# Patient Record
Sex: Female | Born: 1940 | Race: White | Hispanic: No | Marital: Married | Smoking: Former smoker
Health system: Southern US, Community
[De-identification: ages and names within clinical notes are randomized; demographics above are authoritative.]

## PROBLEM LIST (undated history)

## (undated) DIAGNOSIS — I639 Cerebral infarction, unspecified: Secondary | ICD-10-CM

## (undated) DIAGNOSIS — K5790 Diverticulosis of intestine, part unspecified, without perforation or abscess without bleeding: Secondary | ICD-10-CM

## (undated) DIAGNOSIS — I10 Essential (primary) hypertension: Secondary | ICD-10-CM

## (undated) DIAGNOSIS — R413 Other amnesia: Secondary | ICD-10-CM

## (undated) DIAGNOSIS — G40219 Localization-related (focal) (partial) symptomatic epilepsy and epileptic syndromes with complex partial seizures, intractable, without status epilepticus: Secondary | ICD-10-CM

## (undated) DIAGNOSIS — J449 Chronic obstructive pulmonary disease, unspecified: Secondary | ICD-10-CM

## (undated) DIAGNOSIS — M1990 Unspecified osteoarthritis, unspecified site: Secondary | ICD-10-CM

## (undated) DIAGNOSIS — H8309 Labyrinthitis, unspecified ear: Secondary | ICD-10-CM

## (undated) DIAGNOSIS — R569 Unspecified convulsions: Secondary | ICD-10-CM

## (undated) DIAGNOSIS — H9190 Unspecified hearing loss, unspecified ear: Secondary | ICD-10-CM

## (undated) DIAGNOSIS — E119 Type 2 diabetes mellitus without complications: Secondary | ICD-10-CM

## (undated) DIAGNOSIS — I619 Nontraumatic intracerebral hemorrhage, unspecified: Secondary | ICD-10-CM

## (undated) DIAGNOSIS — I4891 Unspecified atrial fibrillation: Secondary | ICD-10-CM

## (undated) DIAGNOSIS — Z1501 Genetic susceptibility to malignant neoplasm of breast: Secondary | ICD-10-CM

## (undated) DIAGNOSIS — F338 Other recurrent depressive disorders: Secondary | ICD-10-CM

## (undated) DIAGNOSIS — Z1509 Genetic susceptibility to other malignant neoplasm: Secondary | ICD-10-CM

## (undated) DIAGNOSIS — I499 Cardiac arrhythmia, unspecified: Secondary | ICD-10-CM

## (undated) DIAGNOSIS — R7303 Prediabetes: Secondary | ICD-10-CM

## (undated) HISTORY — DX: Labyrinthitis, unspecified ear: H83.09

## (undated) HISTORY — DX: Type 2 diabetes mellitus without complications: E11.9

## (undated) HISTORY — DX: Genetic susceptibility to malignant neoplasm of breast: Z15.01

## (undated) HISTORY — DX: Unspecified convulsions: R56.9

## (undated) HISTORY — DX: Other amnesia: R41.3

## (undated) HISTORY — DX: Essential (primary) hypertension: I10

## (undated) HISTORY — PX: CEREBRAL ANEURYSM REPAIR: SHX164

## (undated) HISTORY — DX: Nontraumatic intracerebral hemorrhage, unspecified: I61.9

## (undated) HISTORY — PX: OTHER SURGICAL HISTORY: SHX169

## (undated) HISTORY — DX: Genetic susceptibility to other malignant neoplasm: Z15.09

## (undated) HISTORY — DX: Localization-related (focal) (partial) symptomatic epilepsy and epileptic syndromes with complex partial seizures, intractable, without status epilepticus: G40.219

## (undated) HISTORY — PX: TONSILLECTOMY: SHX5217

## (undated) HISTORY — DX: Unspecified hearing loss, unspecified ear: H91.90

---

## 1997-05-19 ENCOUNTER — Other Ambulatory Visit: Admission: RE | Admit: 1997-05-19 | Discharge: 1997-05-19 | Payer: Self-pay | Admitting: Obstetrics and Gynecology

## 1998-06-18 ENCOUNTER — Other Ambulatory Visit: Admission: RE | Admit: 1998-06-18 | Discharge: 1998-06-18 | Payer: Self-pay | Admitting: Obstetrics and Gynecology

## 1999-10-24 ENCOUNTER — Other Ambulatory Visit: Admission: RE | Admit: 1999-10-24 | Discharge: 1999-10-24 | Payer: Self-pay | Admitting: Obstetrics and Gynecology

## 2000-03-10 ENCOUNTER — Other Ambulatory Visit: Admission: RE | Admit: 2000-03-10 | Discharge: 2000-03-10 | Payer: Self-pay | Admitting: Obstetrics and Gynecology

## 2000-03-10 ENCOUNTER — Encounter (INDEPENDENT_AMBULATORY_CARE_PROVIDER_SITE_OTHER): Payer: Self-pay | Admitting: Specialist

## 2001-11-02 ENCOUNTER — Other Ambulatory Visit: Admission: RE | Admit: 2001-11-02 | Discharge: 2001-11-02 | Payer: Self-pay | Admitting: Obstetrics and Gynecology

## 2003-05-22 ENCOUNTER — Other Ambulatory Visit: Admission: RE | Admit: 2003-05-22 | Discharge: 2003-05-22 | Payer: Self-pay | Admitting: Anesthesiology

## 2003-10-11 ENCOUNTER — Ambulatory Visit (HOSPITAL_COMMUNITY): Admission: RE | Admit: 2003-10-11 | Discharge: 2003-10-11 | Payer: Self-pay | Admitting: Gastroenterology

## 2003-10-11 ENCOUNTER — Encounter (INDEPENDENT_AMBULATORY_CARE_PROVIDER_SITE_OTHER): Payer: Self-pay | Admitting: *Deleted

## 2004-09-13 ENCOUNTER — Other Ambulatory Visit: Admission: RE | Admit: 2004-09-13 | Discharge: 2004-09-13 | Payer: Self-pay | Admitting: Emergency Medicine

## 2005-11-13 ENCOUNTER — Encounter (INDEPENDENT_AMBULATORY_CARE_PROVIDER_SITE_OTHER): Payer: Self-pay | Admitting: Cardiology

## 2005-11-13 ENCOUNTER — Ambulatory Visit (HOSPITAL_COMMUNITY): Admission: RE | Admit: 2005-11-13 | Discharge: 2005-11-13 | Payer: Self-pay | Admitting: Emergency Medicine

## 2005-11-13 ENCOUNTER — Encounter: Payer: Self-pay | Admitting: Vascular Surgery

## 2005-12-23 ENCOUNTER — Encounter (HOSPITAL_COMMUNITY): Admission: RE | Admit: 2005-12-23 | Discharge: 2005-12-23 | Payer: Self-pay | Admitting: Interventional Cardiology

## 2005-12-29 ENCOUNTER — Ambulatory Visit (HOSPITAL_COMMUNITY): Admission: RE | Admit: 2005-12-29 | Discharge: 2005-12-29 | Payer: Self-pay | Admitting: Interventional Cardiology

## 2005-12-29 ENCOUNTER — Encounter (INDEPENDENT_AMBULATORY_CARE_PROVIDER_SITE_OTHER): Payer: Self-pay | Admitting: Interventional Cardiology

## 2007-04-13 ENCOUNTER — Inpatient Hospital Stay (HOSPITAL_COMMUNITY): Admission: EM | Admit: 2007-04-13 | Discharge: 2007-04-16 | Payer: Self-pay | Admitting: Emergency Medicine

## 2008-06-10 ENCOUNTER — Inpatient Hospital Stay (HOSPITAL_COMMUNITY): Admission: EM | Admit: 2008-06-10 | Discharge: 2008-06-16 | Payer: Self-pay | Admitting: Emergency Medicine

## 2008-07-05 ENCOUNTER — Ambulatory Visit (HOSPITAL_BASED_OUTPATIENT_CLINIC_OR_DEPARTMENT_OTHER): Admission: RE | Admit: 2008-07-05 | Discharge: 2008-07-05 | Payer: Self-pay | Admitting: Plastic Surgery

## 2008-07-20 ENCOUNTER — Ambulatory Visit (HOSPITAL_BASED_OUTPATIENT_CLINIC_OR_DEPARTMENT_OTHER): Admission: RE | Admit: 2008-07-20 | Discharge: 2008-07-20 | Payer: Self-pay | Admitting: Plastic Surgery

## 2010-04-21 LAB — BASIC METABOLIC PANEL
Calcium: 9 mg/dL (ref 8.4–10.5)
GFR calc Af Amer: >60 mL/min (ref >60)
GFR calc non Af Amer: >60 mL/min (ref >60)

## 2010-04-21 LAB — POCT HEMOGLOBIN-HEMACUE: Hemoglobin: 13.6 g/dL (ref 12.0–15.0)

## 2010-04-22 LAB — COMPREHENSIVE METABOLIC PANEL
ALT: 34 U/L (ref 0–35)
AST: 74 U/L — ABNORMAL HIGH (ref 0–37)
Alkaline Phosphatase: 47 U/L (ref 39–117)
CO2: 26 mEq/L (ref 19–32)
Chloride: 101 mEq/L (ref 96–112)
Creatinine, Ser: 0.46 mg/dL (ref 0.4–1.2)
GFR calc Af Amer: >60 mL/min (ref >60)
GFR calc non Af Amer: >60 mL/min (ref >60)
Potassium: 3.2 mEq/L — ABNORMAL LOW (ref 3.5–5.1)
Total Bilirubin: 0.2 mg/dL — ABNORMAL LOW (ref 0.3–1.2)

## 2010-04-22 LAB — CBC
HCT: 29.3 % — ABNORMAL LOW (ref 36.0–46.0)
HCT: 29.4 % — ABNORMAL LOW (ref 36.0–46.0)
HCT: 38.9 % (ref 36.0–46.0)
Hemoglobin: 10.2 g/dL — ABNORMAL LOW (ref 12.0–15.0)
Hemoglobin: 10.3 g/dL — ABNORMAL LOW (ref 12.0–15.0)
MCHC: 34.7 g/dL (ref 30.0–36.0)
MCHC: 35 g/dL (ref 30.0–36.0)
MCV: 100.2 fL — ABNORMAL HIGH (ref 78.0–100.0)
Platelets: 193 10*3/uL (ref 150–400)
RBC: 2.93 MIL/uL — ABNORMAL LOW (ref 3.87–5.11)
RBC: 2.94 MIL/uL — ABNORMAL LOW (ref 3.87–5.11)
RBC: 3.93 MIL/uL (ref 3.87–5.11)
RDW: 13.2 % (ref 11.5–15.5)
WBC: 7.7 10*3/uL (ref 4.0–10.5)
WBC: 8 10*3/uL (ref 4.0–10.5)
WBC: 9.8 10*3/uL (ref 4.0–10.5)

## 2010-04-22 LAB — POCT I-STAT, CHEM 8
BUN: 5 mg/dL — ABNORMAL LOW (ref 6–23)
Calcium, Ion: 1.14 mmol/L (ref 1.12–1.32)
Chloride: 105 mEq/L (ref 96–112)
HCT: 39 % (ref 36.0–46.0)
Potassium: 3.8 mEq/L (ref 3.5–5.1)
Sodium: 137 mEq/L (ref 135–145)
TCO2: 23 mmol/L (ref 0–100)

## 2010-04-22 LAB — BASIC METABOLIC PANEL
BUN: 1 mg/dL — ABNORMAL LOW (ref 6–23)
Calcium: 8.8 mg/dL (ref 8.4–10.5)
Calcium: 8.9 mg/dL (ref 8.4–10.5)
Calcium: 9 mg/dL (ref 8.4–10.5)
Chloride: 104 mEq/L (ref 96–112)
Creatinine, Ser: 0.55 mg/dL (ref 0.4–1.2)
GFR calc Af Amer: >60 mL/min (ref >60)
GFR calc Af Amer: >60 mL/min (ref >60)
GFR calc non Af Amer: >60 mL/min (ref >60)
GFR calc non Af Amer: >60 mL/min (ref >60)
GFR calc non Af Amer: >60 mL/min (ref >60)
Sodium: 135 mEq/L (ref 135–145)

## 2010-04-22 LAB — HEPATIC FUNCTION PANEL
Bilirubin, Direct: 0.1 mg/dL (ref 0.0–0.3)
Indirect Bilirubin: 0.1 mg/dL — ABNORMAL LOW (ref 0.3–0.9)
Total Bilirubin: 0.2 mg/dL — ABNORMAL LOW (ref 0.3–1.2)

## 2010-04-22 LAB — MAGNESIUM: Magnesium: 1.8 mg/dL (ref 1.5–2.5)

## 2010-04-23 LAB — COMPREHENSIVE METABOLIC PANEL WITH GFR
ALT: 22 U/L (ref 0–35)
Alkaline Phosphatase: 50 U/L (ref 39–117)
Chloride: 95 meq/L — ABNORMAL LOW (ref 96–112)
Glucose, Bld: 118 mg/dL — ABNORMAL HIGH (ref 70–99)
Potassium: 2.6 meq/L — CL (ref 3.5–5.1)
Sodium: 134 meq/L — ABNORMAL LOW (ref 135–145)
Total Protein: 6.9 g/dL (ref 6.0–8.3)

## 2010-04-23 LAB — COMPREHENSIVE METABOLIC PANEL
ALT: 19 U/L (ref 0–35)
AST: 37 U/L (ref 0–37)
AST: 38 U/L — ABNORMAL HIGH (ref 0–37)
Albumin: 3.3 g/dL — ABNORMAL LOW (ref 3.5–5.2)
Albumin: 4 g/dL (ref 3.5–5.2)
Alkaline Phosphatase: 40 U/L (ref 39–117)
BUN: 3 mg/dL — ABNORMAL LOW (ref 6–23)
BUN: 5 mg/dL — ABNORMAL LOW (ref 6–23)
CO2: 22 mEq/L (ref 19–32)
CO2: 25 mEq/L (ref 19–32)
Calcium: 8.4 mg/dL (ref 8.4–10.5)
Calcium: 9.6 mg/dL (ref 8.4–10.5)
Chloride: 101 mEq/L (ref 96–112)
Creatinine, Ser: 0.51 mg/dL (ref 0.4–1.2)
Creatinine, Ser: 0.67 mg/dL (ref 0.4–1.2)
GFR calc Af Amer: >60 mL/min (ref >60)
GFR calc Af Amer: >60 mL/min (ref >60)
GFR calc non Af Amer: >60 mL/min (ref >60)
GFR calc non Af Amer: >60 mL/min (ref >60)
Glucose, Bld: 116 mg/dL — ABNORMAL HIGH (ref 70–99)
Potassium: 4 mEq/L (ref 3.5–5.1)
Sodium: 133 mEq/L — ABNORMAL LOW (ref 135–145)
Total Bilirubin: 0.4 mg/dL (ref 0.3–1.2)
Total Bilirubin: 0.5 mg/dL (ref 0.3–1.2)
Total Protein: 5.5 g/dL — ABNORMAL LOW (ref 6.0–8.3)

## 2010-04-23 LAB — URINALYSIS, ROUTINE W REFLEX MICROSCOPIC
Bilirubin Urine: NEGATIVE
Bilirubin Urine: NEGATIVE
Glucose, UA: NEGATIVE mg/dL
Glucose, UA: NEGATIVE mg/dL
Ketones, ur: 15 mg/dL — AB
Ketones, ur: NEGATIVE mg/dL
Leukocytes, UA: NEGATIVE
Leukocytes, UA: NEGATIVE
Nitrite: NEGATIVE
Nitrite: NEGATIVE
Protein, ur: 100 mg/dL — AB
Protein, ur: NEGATIVE mg/dL
Specific Gravity, Urine: 1.01 (ref 1.005–1.030)
Specific Gravity, Urine: 1.017 (ref 1.005–1.030)
Urobilinogen, UA: 0.2 mg/dL (ref 0.0–1.0)
Urobilinogen, UA: 0.2 mg/dL (ref 0.0–1.0)
pH: 5 (ref 5.0–8.0)
pH: 7.5 (ref 5.0–8.0)

## 2010-04-23 LAB — CBC
HCT: 31.2 % — ABNORMAL LOW (ref 36.0–46.0)
HCT: 33.5 % — ABNORMAL LOW (ref 36.0–46.0)
HCT: 38.7 % (ref 36.0–46.0)
Hemoglobin: 10.8 g/dL — ABNORMAL LOW (ref 12.0–15.0)
Hemoglobin: 11.8 g/dL — ABNORMAL LOW (ref 12.0–15.0)
Hemoglobin: 13.7 g/dL (ref 12.0–15.0)
MCHC: 34.8 g/dL (ref 30.0–36.0)
MCHC: 35.1 g/dL (ref 30.0–36.0)
MCHC: 35.3 g/dL (ref 30.0–36.0)
MCV: 98.6 fL (ref 78.0–100.0)
MCV: 99.3 fL (ref 78.0–100.0)
MCV: 99.7 fL (ref 78.0–100.0)
Platelets: 204 10*3/uL (ref 150–400)
Platelets: 232 K/uL (ref 150–400)
Platelets: 274 10*3/uL (ref 150–400)
RBC: 3.14 MIL/uL — ABNORMAL LOW (ref 3.87–5.11)
RBC: 3.36 MIL/uL — ABNORMAL LOW (ref 3.87–5.11)
RBC: 3.93 MIL/uL (ref 3.87–5.11)
RDW: 13.1 % (ref 11.5–15.5)
RDW: 13.2 % (ref 11.5–15.5)
RDW: 13.3 % (ref 11.5–15.5)
WBC: 11.3 10*3/uL — ABNORMAL HIGH (ref 4.0–10.5)
WBC: 13.6 10*3/uL — ABNORMAL HIGH (ref 4.0–10.5)
WBC: 9.5 K/uL (ref 4.0–10.5)

## 2010-04-23 LAB — PHENYTOIN LEVEL, TOTAL
Phenytoin Lvl: 12 ug/mL (ref 10.0–20.0)
Phenytoin Lvl: 6.3 ug/mL — ABNORMAL LOW (ref 10.0–20.0)

## 2010-04-23 LAB — CULTURE, BLOOD (ROUTINE X 2)
Culture: NO GROWTH
Culture: NO GROWTH

## 2010-04-23 LAB — URINE MICROSCOPIC-ADD ON

## 2010-04-23 LAB — BASIC METABOLIC PANEL
BUN: <1 mg/dL — ABNORMAL LOW (ref 6–23)
CO2: 28 mEq/L (ref 19–32)
Calcium: 8.3 mg/dL — ABNORMAL LOW (ref 8.4–10.5)
Chloride: 104 mEq/L (ref 96–112)
Creatinine, Ser: 0.42 mg/dL (ref 0.4–1.2)
GFR calc Af Amer: >60 mL/min (ref >60)
GFR calc non Af Amer: >60 mL/min (ref >60)
Glucose, Bld: 135 mg/dL — ABNORMAL HIGH (ref 70–99)
Potassium: 3.4 mEq/L — ABNORMAL LOW (ref 3.5–5.1)
Sodium: 138 mEq/L (ref 135–145)

## 2010-04-23 LAB — URINE CULTURE
Colony Count: NO GROWTH
Culture: NO GROWTH
Special Requests: NEGATIVE

## 2010-04-23 LAB — DIFFERENTIAL
Basophils Absolute: 0.1 10*3/uL (ref 0.0–0.1)
Basophils Relative: 1 % (ref 0–1)
Eosinophils Absolute: 0.1 K/uL (ref 0.0–0.7)
Eosinophils Relative: 2 % (ref 0–5)
Lymphocytes Relative: 29 % (ref 12–46)
Lymphs Abs: 2.7 10*3/uL (ref 0.7–4.0)
Monocytes Absolute: 0.5 K/uL (ref 0.1–1.0)
Monocytes Relative: 5 % (ref 3–12)
Neutro Abs: 6 10*3/uL (ref 1.7–7.7)
Neutrophils Relative %: 64 % (ref 43–77)

## 2010-04-23 LAB — PROTIME-INR
INR: 0.9 (ref 0.00–1.49)
Prothrombin Time: 12.2 seconds (ref 11.6–15.2)

## 2010-04-23 LAB — SAMPLE TO BLOOD BANK

## 2010-04-23 LAB — GLUCOSE, CAPILLARY: Glucose-Capillary: 117 mg/dL — ABNORMAL HIGH (ref 70–99)

## 2010-04-23 LAB — APTT: aPTT: 24 seconds (ref 24–37)

## 2010-05-13 IMAGING — CR DG ANKLE PORT 2V*R*
3 series · 3 of 3 positions shown · non-contrast
Comparison: 06/10/2008.

CLINICAL DATA: ORIF ankle fracture.

PORTABLE RIGHT ANKLE - 2 VIEW

[view not recorded (1 of 3)]
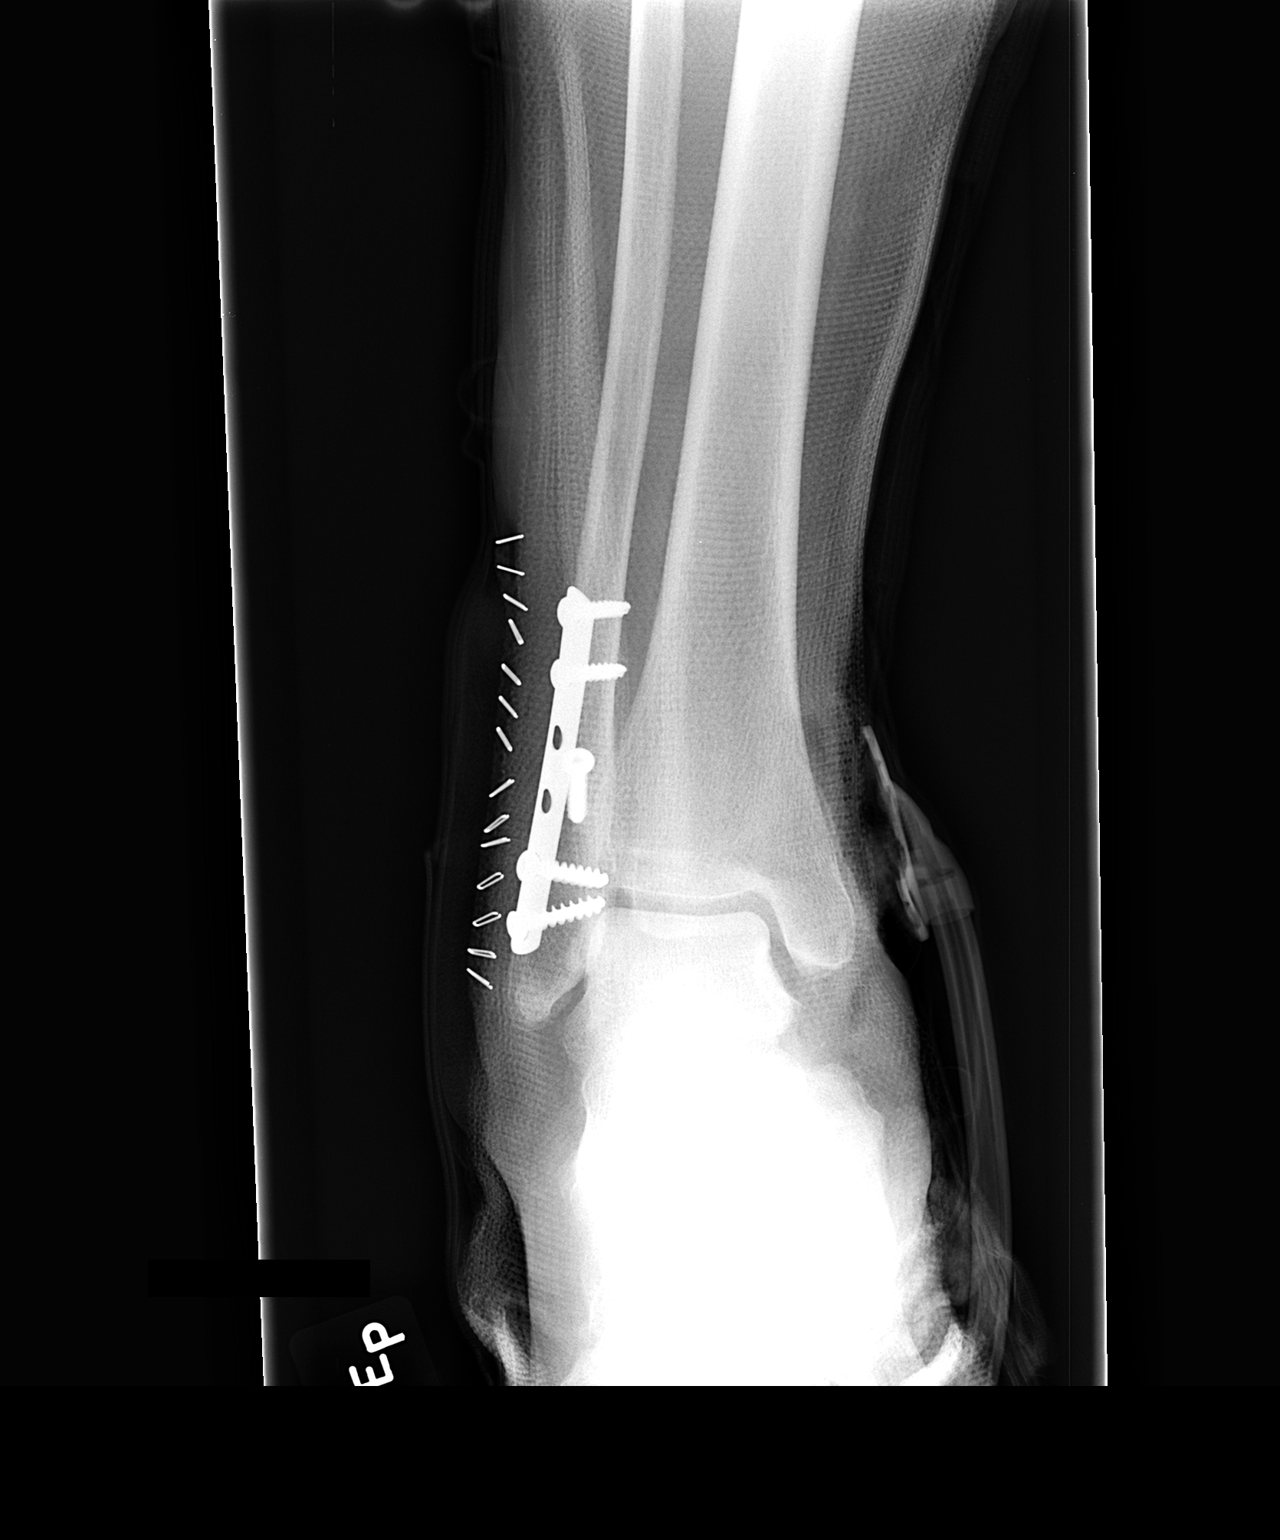

[view not recorded (2 of 3)]
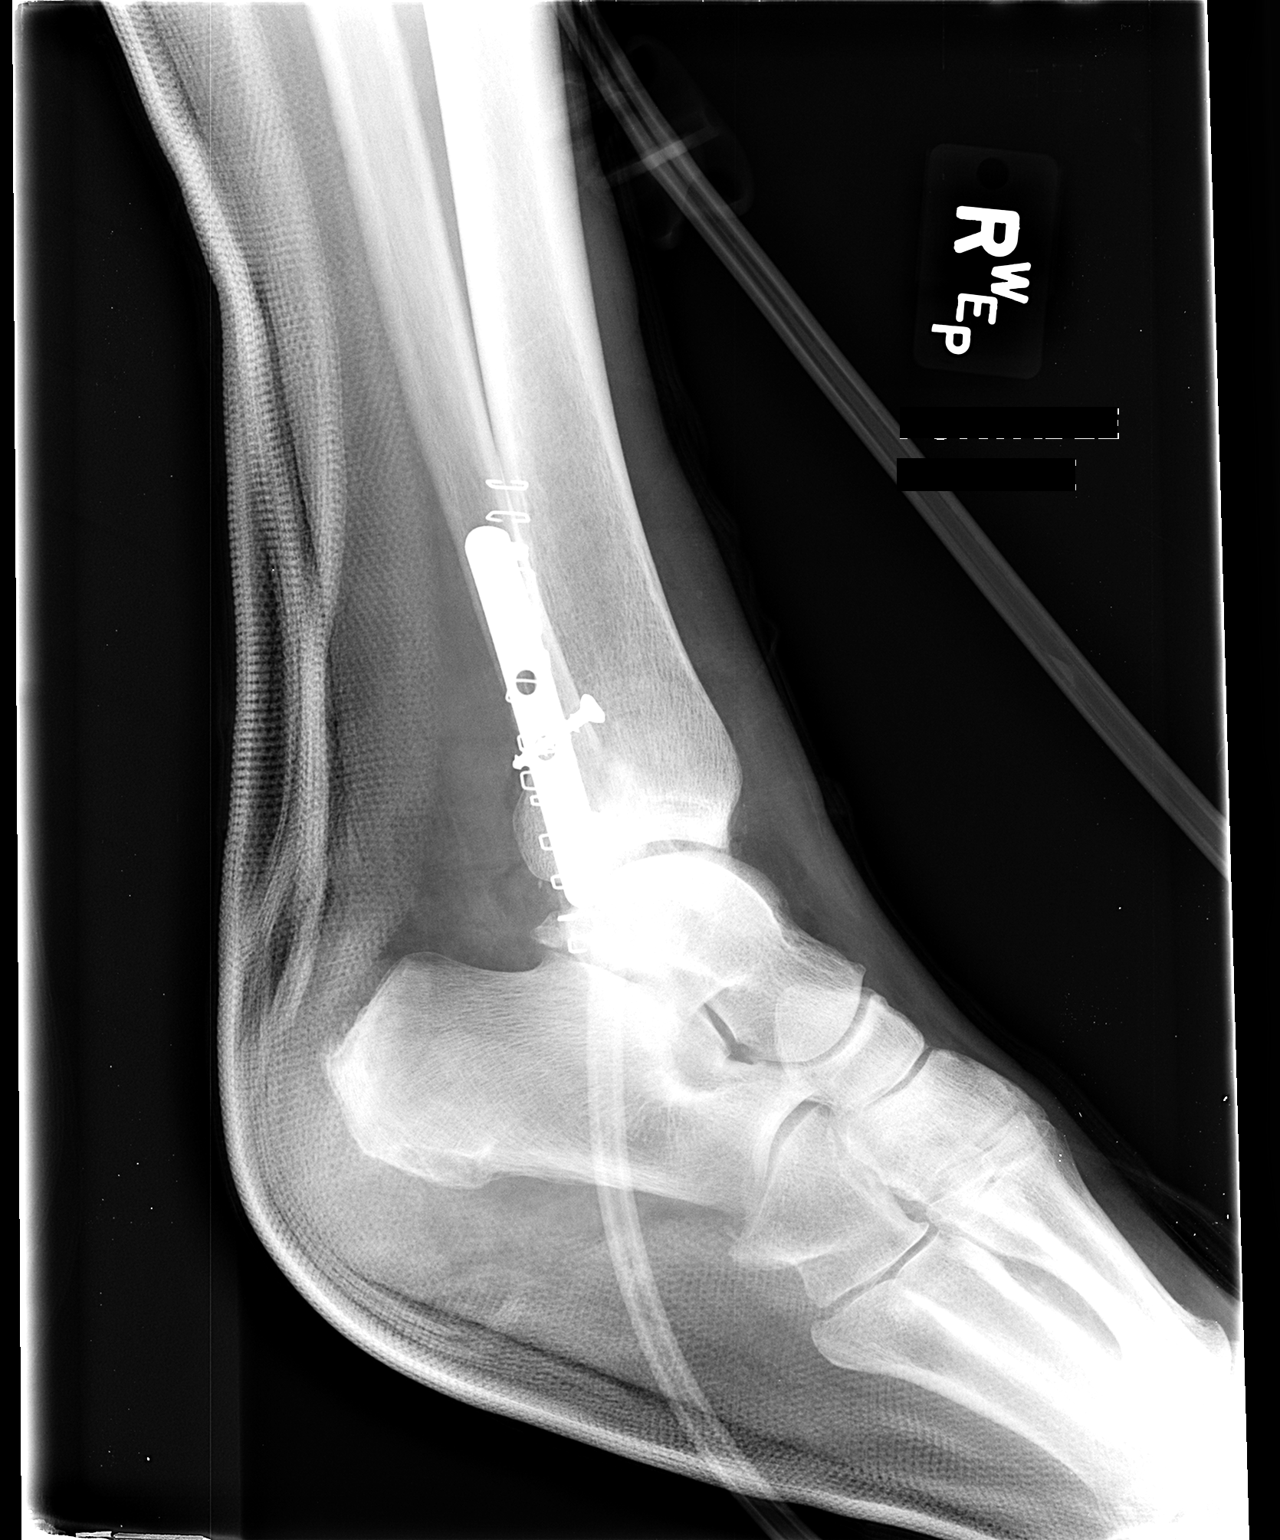

[view not recorded (3 of 3)]
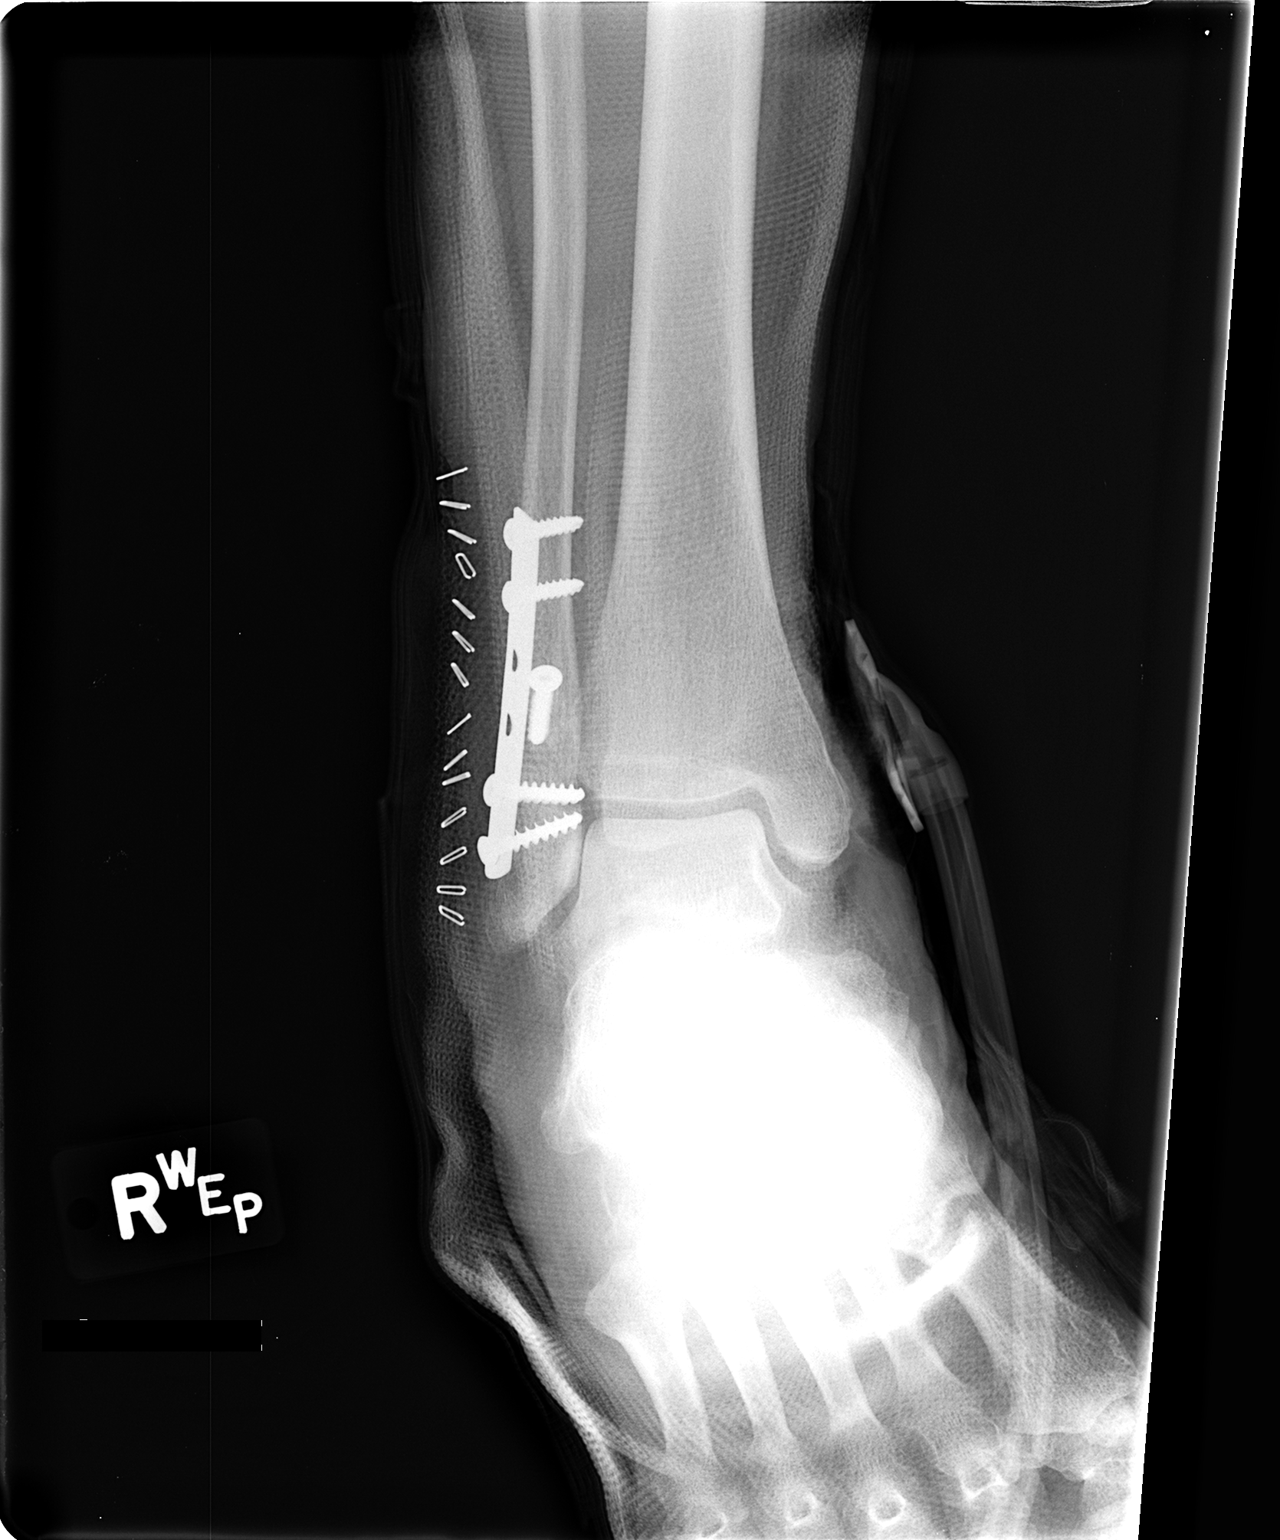

[3 of 3 positions shown; findings below may reference images not displayed]

FINDINGS: Plate and screw fixation of the distal fibular fracture
with satisfactory alignment.  The ankle alignment is satisfactory
and  no other fractures are seen.  Posterior fiberglass splint is
in place.
IMPRESSION: Satisfactory plate fixation of distal fibular fracture.

## 2010-05-15 IMAGING — CR DG ANKLE COMPLETE 3+V*R*
2 series · 2 of 2 positions shown · non-contrast
Comparison: 06/11/2008

CLINICAL DATA: Status post ORIF and recasting of right ankle
fracture.

RIGHT ANKLE - COMPLETE 3+ VIEW

[view not recorded (1 of 2)]
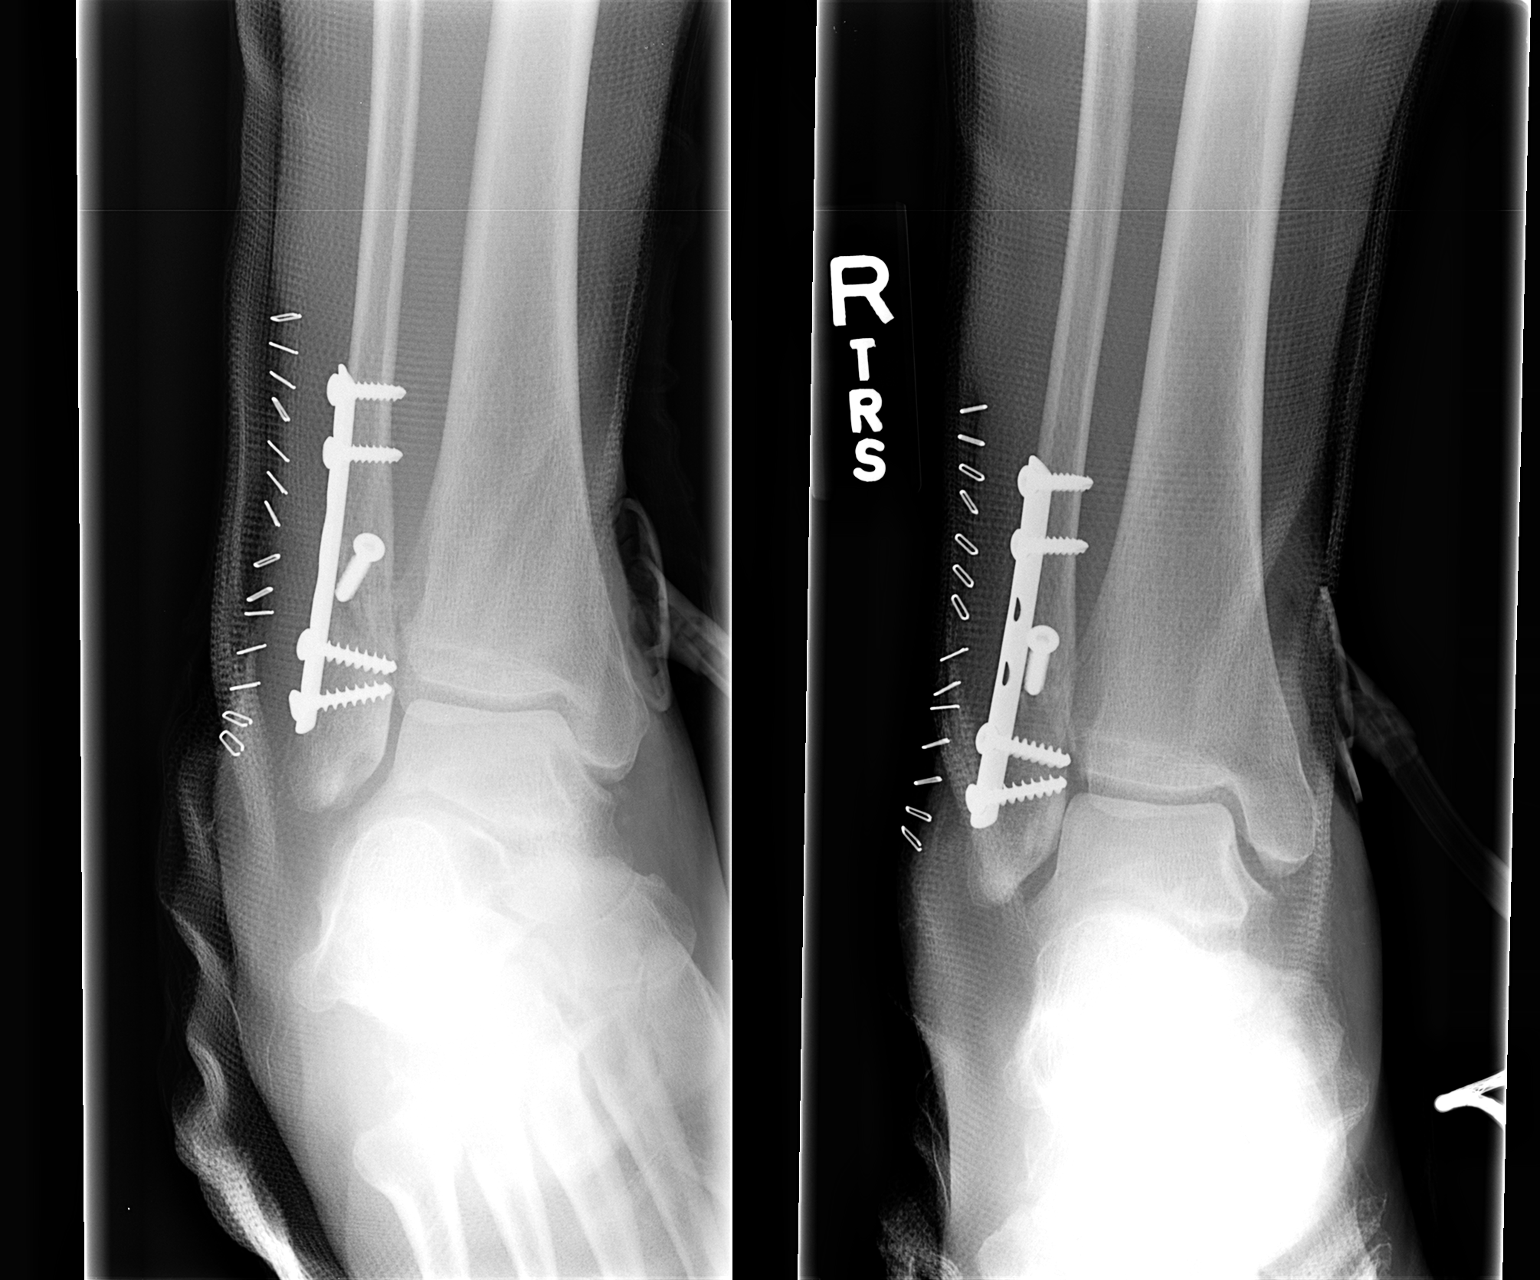

[view not recorded (2 of 2)]
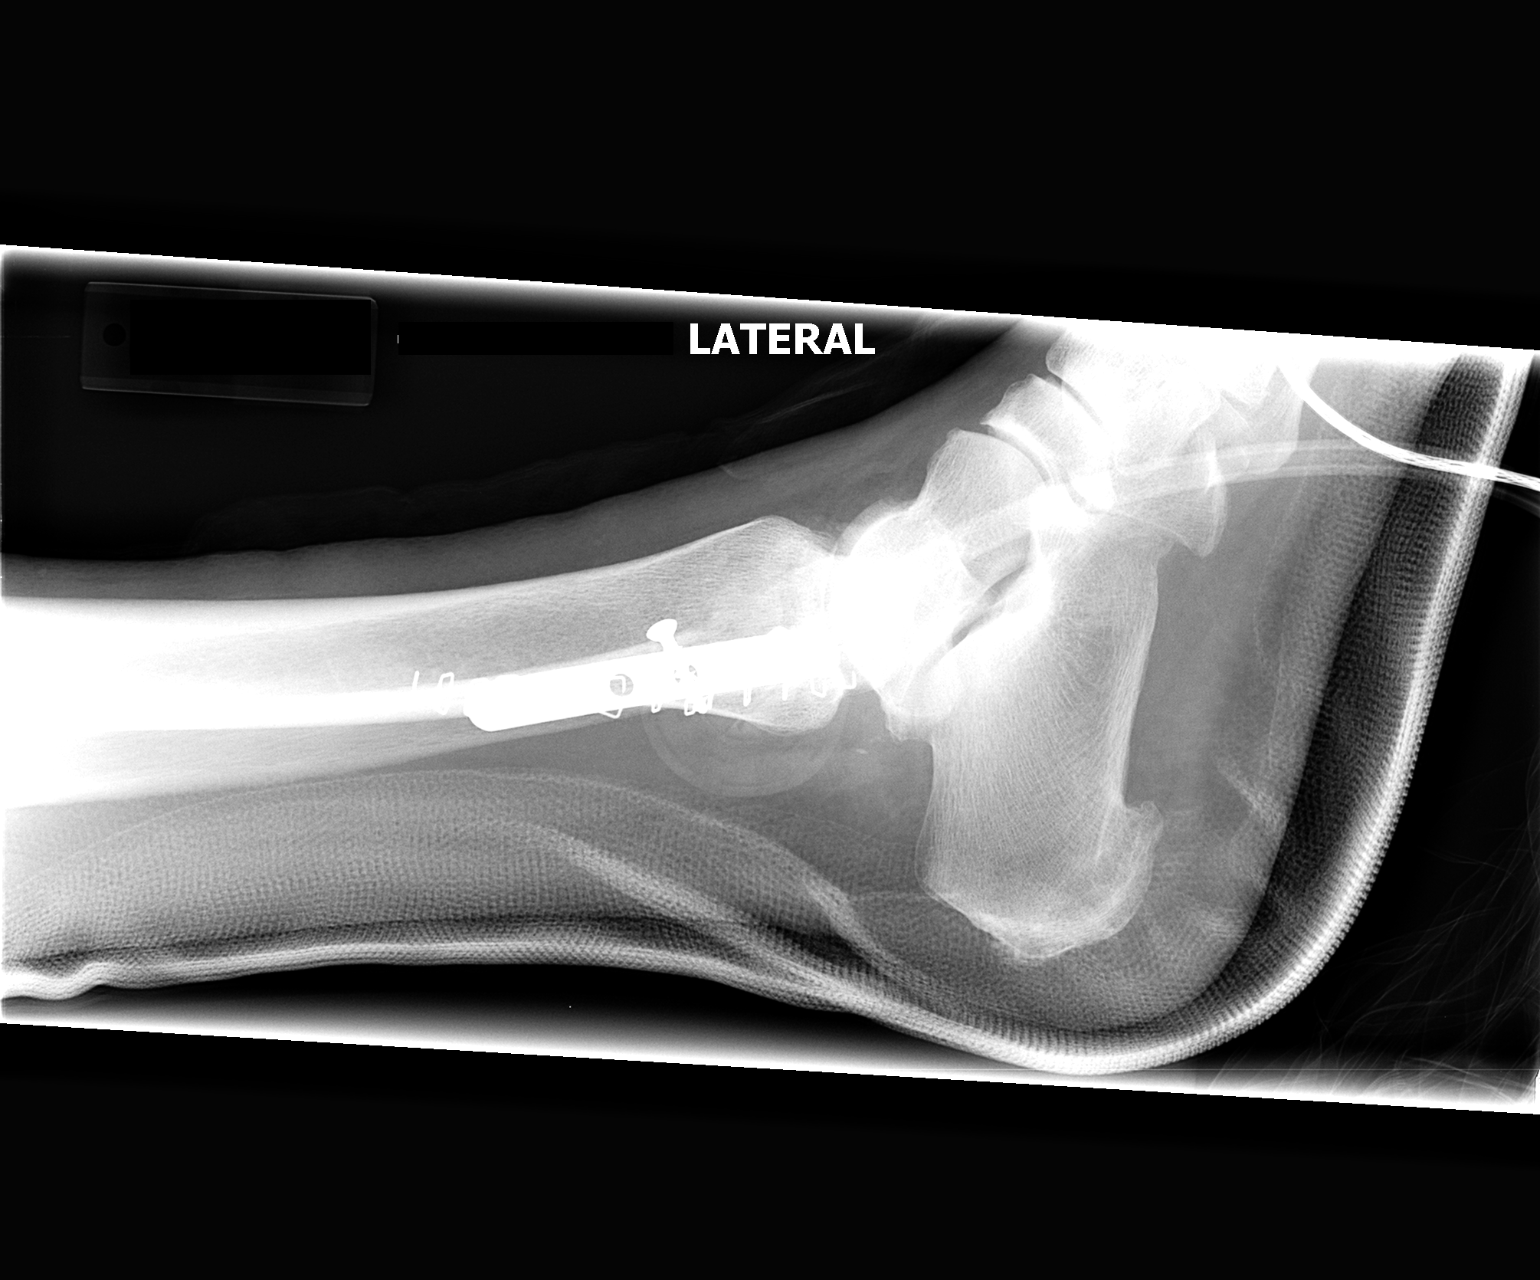

[2 of 2 positions shown; findings below may reference images not displayed]

FINDINGS: Alignment of the ankle is near anatomic.  Alignment at
the level of the fibular ORIF is stable.
IMPRESSION: Stable and near anatomic alignment of right ankle.

## 2010-05-28 NOTE — Consult Note (Signed)
NAMEBRAYLIE, BADAMI NO.:  [id number]   MEDICAL RECORD NO.:  [id number]           PATIENT TYPE:   LOCATION:                                 FACILITY:   PHYSICIAN:  Loreta Ave, MD DATE OF BIRTH:  1940-01-20   DATE OF CONSULTATION:  06/15/2008  DATE OF DISCHARGE:                                 CONSULTATION   REFERRING PHYSICIAN:  Elana Alm. Thurston Hole, MD   CHIEF COMPLAINT:  Open right ankle wound.   HISTORY OF PRESENT ILLNESS:  Beth [name] is a 70 year old Caucasian  female who had a seizure on Jun 10, 2008.  She was in the bathroom and  somehow caught her foot between the toilet and the wall when she had her  seizure.  As she fell, she sustained an injury to her right ankle.  She  came to the [facility] Emergency Room where she was found to have a  distal fibular fracture and a grade 3 open fracture dislocation of her  right ankle.  At that point, Orthopedics was consulted.  Initially, she  received a closed reduction with irrigation of the wound to the right  ankle on Jun 10, 2008.  The following day, her care was transferred to  Dr. Thurston Hole.  He performed open reduction internal fixation of the right  ankle fracture dislocation with open reduction and internal fixation of  the right fibular fracture, medial deltoid ligament repair, medial  posterior tibial tendon retinacular repair, irrigation and debridement,  and application of a VAC to her medial ankle wound.   Since that time, she has been maintained in right ankle splint with a  VAC on her wound.  I was consulted today for management of her open  ankle wound.   She has not had any problems with clinical infection despite her open  wound.   PAST MEDICAL HISTORY:  Significant for hypertension, history of a  cerebrovascular accident, encephalomalacia.   MEDICATIONS:  Gentamicin, Ancef, Colace, Lovenox, Zocor, Dilantin.   P.r.n. medications are, Ativan, MiraLax, Robaxin, Lopressor, morphine,  Percocet, Zofran, and Ambien.   Home medications are, aspirin 325 mg p.o. daily, Allegra 180 mg p.o.  daily, captopril p.o. daily.   FAMILY HISTORY:  Significant for seizure disorder in the daughter.   SOCIAL HISTORY:  She is an ex-smoker.  She does not use alcohol or  drugs.   REVIEW OF SYSTEMS:  A complete review of systems is negative aside from  as documented in the HPI.   PHYSICAL EXAMINATION:  VITAL SIGNS:  Temperature is 97.4, heart rate 98,  respirations 22, blood pressure is 145/86, and 95% on room air.  EXTREMITIES:  Examination of the right lower extremity reveals normal  active and passive range of motion of the hip and knee.  Range of motion  of the ankle is not tested secondary to recent injury and tendon  repairs.  Note, she was maintained in 90 degrees of dorsiflexion  throughout the examination.  She had 2+ dorsalis pedis pulse.  Normal  sensation to the foot.  Examination of the medial ankle reveals a  4 x 6  cm full thickness wound over the medial malleolus.  There is some  granulation tissue.  A portion of the superior flap looks of  questionable viability.  There is exposed tendon and bone in the base of  the wound.  There is no erythema or purulence.   ASSESSMENT/PLAN:  Beth [name] is a 70 year old Caucasian female with a  right ankle wound secondary to open fracture dislocation of right ankle.   I had a discussion with the patient today about the surgical approach is  to soft tissue reconstruction about the ankle.  I believe that she is a  very good candidate for [facility] management and eventual Integra placement,  and eventual split-thickness skin grafting.  She is going to continue  with her VAC having that changed every 3-4 days.  VAC changes will be in  Dr. Sherene Sires office and possibly in my office.  I may attend his clinic  when she is there to evaluate the wound after her discharge from the  hospital.   She is to maintain her ankle splint per Orthopedics  and I will leave  antibiotic decisions up to them as well.  I would expect that her wound  will be ready for Integra placement within 2 weeks and likely split-  thickness skin grafting within 2 weeks after that.   As always, I appreciate the opportunity to care for your patient.      Loreta Ave, MD  Electronically Signed     CF/MEDQ  D:  06/15/2008  T:  06/16/2008  Job:  [id number]

## 2010-05-28 NOTE — Consult Note (Signed)
[name], [name]                ACCOUNT NO.:  [id number]   MEDICAL RECORD NO.:  [id number]          PATIENT TYPE:  INP   LOCATION:  4736                         FACILITY:  [facility]   PHYSICIAN:  Deanna Artis. Hickling, M.D.DATE OF BIRTH:  [birth year]   DATE OF CONSULTATION:  06/13/2008  DATE OF DISCHARGE:                                 CONSULTATION   CHIEF COMPLAINT:  Seizure with associated fall and ankle fracture.   HISTORY OF THE PRESENT CONDITION:  Beth [name] is a 70 year old woman  with a history of 2 generalized seizures, 1 which happened at home and  another in the emergency room.  The first caused her to fall and  fracture her right ankle.  Second was witnessed when she was in the  emergency room.  She had a grade 3 fracture-dislocation and has had  surgery with an open reduction and internal fixation.  The patient had  repair of medial deltoid ligament and her posterior tibial tendon.  She  has tolerated the surgery well.   We were asked to see her to determine whether or not Dilantin was an  appropriate medication for her and to make recommendations for further  treatment of her seizure disorder.   The patient was last seen in our office on November 05, 2007.  She has  had episodes of slowing of her thinking, perseverating, and was thought  to have continued seizure auras.  Routine EEG was normal.  Prolonged  ambulatory EEG on August 31, 2007 was normal with the patient awake and  asleep.   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  She scored 30/30 on mini-  mental status, 12 on animal fluency test, and 4/4 in clock drawing.   The patient was recently loaded with Dilantin in order to push her drug  level up from 6.5 mcg/mL to 12.  With her low albumin of 3.3, this would  be a corrected Dilantin at 15.8.  She is feeling increasing dizziness  and jiggling of her eyes (nystagmus), which is to be  expected with this  rapid load.   He questioned today whether or not to push her Dilantin dose up from 350  mg to 400 mg per day or to switch to another medication such as  levetiracetam or carbamazepine.   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  The patient has some encephalomalacia in  that region on her most recent CT scan and subsequent MRI scan.   I have reviewed these studies and agreed with the findings.   It is likely that the patient's seizures come from the vicinity of those  strokes.  Other medical problems include depression and sinusitis.   CURRENT MEDICATIONS:  1. Gentamicin IV day 2 to 4.  2. Cefazolin 1 g every 6 hours day 2 to 5.  3. Colace 100 mg twice daily.  4. Lovenox subcutaneously for DVT prophylaxis.  5. Zocor 10 mg daily.  6. Dilantin  400 mg daily.   P.R.N. MEDICINES:  Tylenol, Ativan, Maalox, Robaxin, Lopressor,  morphine, Zofran, Percocet, senna, and Ambien.   OTHER MEDICATIONS:  1. Claritin 10 mg daily.  2. Lexapro 20 mg daily.   HOME MEDICATIONS:  1. Aspirin 325 mg daily.  2. Allegra 180 mg daily.  3. Captopril and hydrochlorothiazide 50/15 daily.   FAMILY HISTORY:  Positive for seizures in a daughter, who has cerebral  palsy.  Remainder of family history is negative.   SOCIAL HISTORY:  The patient is an ex-smoker.  She does not use alcohol  or drugs.  A 12-system review is negative except as noted above in the  history of present illness.   PHYSICAL EXAMINATION:  GENERAL:  Today, a well-developed and well-  nourished pleasant woman, mildly obese, no distress.  VITAL SIGNS:  Temperature 101.8 around 10 p.m. last night, now 99.3;  blood pressure 147/73; resting pulse 95; respirations 28; and oxygen  saturation 98% on 2 L.  HEAD, EYES, EARS, NOSE, AND THROAT:  No signs of infection.  NECK:  Supple.  Full range  of motion.  No cranial or cervical bruits.  LUNGS:  Clear to auscultation.  HEART:  No murmurs.  Pulses normal.  ABDOMEN:  Soft and nontender.  Bowel sounds normal.  EXTREMITIES:  Well formed.  The right ankle is in a cast.  The left is  in a sequential pressure device just involving her ankle.  NEUROLOGIC:  Mental status; the patient is awake, alert, attentive, and  appropriate.  No dysphasia, dyspraxia, names objects, follows commands,  conveys thoughts and feelings.  Cranial nerves; round reactive pupils.  Visual fields full.  Extraocular movements full and conjugate.  Symmetric facial strength.  Midline tongue.  Air conduction greater than  bone conduction bilaterally.   Motor examination; the patient had excellent strength in her arms and  her left leg.  The right leg, she had some giveaway strength in the hip  flexor, knee flexor, and extensor. But it was near normal.  He did not  test the ankle because of the pain.  She is able to wiggle her toes  bilaterally.   Sensation shows a mild peripheral neuropathy that is stocking in  distribution bilaterally in her legs.  It is normal in her arms.  The  patient had good vibratory sensation, good stereognosis.  Cerebellar  examination; good finger-to-nose, rapid alternating movements.  Gait was  not tested.  Deep tendon reflexes were normal at the patella and biceps,  diminished elsewhere.  The patient had a flexor plantar response on the  left, I could not test the right because of her cast.   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  The patient is doing  well at this time.  She is having some side effects of a loading dose of  Dilantin, which pushed her level up to 12.  I have recommended  increasing her Dilantin to 400, which I agree with.  If she is not able  to adjust this, or she has further seizures, we may need to consider  either carbamazepine,  which will affect many other medicines that she  takes, or levetiracetam, which will not affect any.  Carbamazepine is  much less expensive than levetiracetam, but apparently with supplemental  insurance, there is no donor at all for this family and so they can  afford medications.   At present, they have elected to continue with  medicine that has worked  well.  Her last seizure was a year ago.  I think that it is perfectly  reasonable decision.  They will follow up with Dr. Vickey Huger in our  office after discharge.   CURRENT LABORATORIES:  Sodium 140, potassium 3.3, chloride 102, CO2 of  32, BUN 1, creatinine 0.46, and glucose 136.  Hemoglobin 10.3, which has  steadily dropped from 13.7, white count 9800, and platelet count  191,000.  Urine creatinine and blood cultures are pending at this time.  Calcium 8.7 and albumin 3.3.  Dilantin level rose from 6.3-12.0 after  being loaded.  This corrects to 15.8 due to her low albumin.   EEG in March 2009 showed mild right hemispheric slowing, which would be  consistent with the patient's right temporal lobe stroke.  It did not  show any seizure activity.   It would be worthwhile to perform an EEG at this time as we continue her  Dilantin at 400 mg.  I have discussed this at length with her family and  they are comfortable with this decision.  I will inform Dr. Vickey Huger of  the patient's presence in the hospital so that she can followup.       Deanna Artis. Sharene Skeans, M.D.  Electronically Signed     [facility]/MEDQ  D:  06/13/2008  T:  06/14/2008  Job:  [id number]   cc:   Kela Millin, M.D.  Oley Balm Georgina Pillion, M.D.

## 2010-05-28 NOTE — Op Note (Signed)
Beth [name], Beth [name]                ACCOUNT NO.:  [id number]   MEDICAL RECORD NO.:  [id number]          PATIENT TYPE:  INP   LOCATION:  3111                         FACILITY:  [facility]   PHYSICIAN:  Lenard Galloway. Mortenson, M.D.DATE OF BIRTH:  09-07-1940   DATE OF PROCEDURE:  06/10/2008  DATE OF DISCHARGE:                               OPERATIVE REPORT   PREOPERATIVE DIAGNOSIS:  Grade 3 open fracture-dislocation of the right  ankle.   PROCEDURE:  Closed reduction with irrigation of the wound to the right  ankle.   INDICATIONS FOR PROCEDURE:  This is a 70 year old white female who has  suffered a seizure and a fall with an open grade 3 right distal fibula  fracture and open dislocation of the tibiotalar joint.  She had a  seizure and is presently in a postictal state.  She has now gained  consciousness since initial closed reduction; however, she has continued  to have a cyanotic look to the toes.  Her capillary refill is about 3  seconds.  She is now indicated because of potassium of 2.6 and  resistance of anesthesia to put her to sleep to do an irrigation of the  wound and resplinting in the emergency room.   The patient was in the emergency room, and initially the splints were  removed and it was noted that she had lost reduction of her right ankle,  and there was a flap of skin now between the talus and the lateral  portion of the medial malleolus.  The distal tibia was out through the  skin at this time.  Because of her severe pain, the emergency room  physician elected to use propofol, and once she was sedated this area  was irrigated with at least 4 liters of normal sterile saline.  With the  use of a Kelly clamp, the skin was then brought over the medial  malleolus and a closed manipulative reduction was performed.  Her toes  then pinked up at that time.  She was placed with a saline gauze over  the wound and a posterior splint and sugar tong splint was placed.  She  tolerated the  procedure well.  Post-procedure, she had good capillary  refill and pink toes that warmed.  She was admitted to the Medical  service, and once her potassium is stabilized then she will undergo an  open reduction and internal fixation of the distal fibula and irrigation  and debridement with repair medially.      Oris Drone Petrarca, P.A.-C.      Rodney A. Chaney Malling, M.D.  Electronically Signed    [facility]/MEDQ  D:  06/11/2008  T:  06/11/2008  Job:  [id number]

## 2010-05-28 NOTE — Discharge Summary (Signed)
NAMEBRECK, [name] NO.:  [id number]   MEDICAL RECORD NO.:  [id number]          PATIENT TYPE:  INP   LOCATION:  4736                         FACILITY:  [facility]   PHYSICIAN:  Ramiro Harvest, MD    DATE OF BIRTH:  16-Jun-1940   DATE OF ADMISSION:  06/10/2008  DATE OF DISCHARGE:  06/16/2008                               DISCHARGE SUMMARY   PRIMARY CARE PHYSICIAN:  Dr. Georgina Pillion of [facility].   DISCHARGE DIAGNOSES:  1. Recurrent seizures.  2. Hypokalemia.  3. Hyponatremia.  4. Open fracture dislocation of the ankle, status post open reduction      and internal fixation per Dr. Thurston Hole, Jun 11, 2008.  5. Hypertension.  6. Depression.  7. History of tobacco abuse.  8. History of a prior cerebrovascular accident in 2008.   DISCHARGE MEDICATIONS:  1. Captopril 50 mg p.o. b.i.d.  2. Dilantin 400 mg p.o. daily.  3. Simvastatin 10 mg p.o. daily.  4. Atenolol 50 mg p.o. b.i.d.  5. Lexapro 20 mg p.o. daily.  6. Allegra 180 mg p.o. daily.  7. Aspirin 325 mg p.o. daily.  8. Flonase nasal spray 50 mcg 2 sprays to each nostril as needed.   DISPOSITION AND FOLLOWUP:  Patient will be discharged home with home  health PT/OT, a registered nurse, a wheelchair.  She will also be  discharged home with her wound VAC and OT as well.  Patient will need a  basic metabolic profile drawn by the registered nurse on Monday, June 19, 2008, and results faxed to patient's PCP to follow up on her  electrolytes and renal function.  Patient will need to follow up with  PCP in 1 week to reassess her blood pressure and follow up on her  electrolytes.  Patient's hydrochlorothiazide was discontinued during the  hospitalization secondary to her hypokalemia.  Patient will only be  discharged home on the captopril and atenolol for blood pressure  medications and blood pressure will need to be reassessed per PCP.  Patient will need to follow up with Dr. Vickey Huger in 2 weeks to reassess  her  recurrent seizures.  On followup, patient will need a free Dilantin  level checked.  Patient's Dilantin was increased to 400 mg daily and  this will need to be followed up upon per urologist.  Patient is to  follow up also with Dr. Thurston Hole as scheduled for followup on her wound  VAC changes, as well as her open ankle fracture.   CONSULTATIONS DONE:  1. An Ortho consult was done.  Patient was seen in consultation by Dr.      Chaney Malling of Orthopedics on Jun 10, 2008.  2. A Neurology consult was done.  Patient was seen in consultation by      Dr. Sharene Skeans on June 13, 2008.  3. Consultation was also done by Dr. Noelle Penner on June 15, 2008.   PROCEDURES PERFORMED:  1. A closed reduction with irrigation of the wound to the right ankle      was done on Jun 10, 2008, per Dr. Chaney Malling of Orthopedics.  2.  An ORIF of the right ankle fracture dislocation with open reduction      internal fixation of the right fibular fracture was done on Jun 11, 2008.  Also, with a right medial deltoid ligament repair.  3. Right ankle medial posterior tibial tendon retinacular repair was      also done on Jun 11, 2008.  4. Thorough irrigation and debridement of the open right wound was      also done on Jun 11, 2008.  5. Application of wound VAC to the right open ankle wound was done on      Jun 11, 2008.  6. An EEG was performed on June 14, 2008, which was abnormal due to      presence of mild bihemispheric dysfunction, as well as focal left      hemispheric mild cortical irritability.  No definite epileptiform      features were noted.  7. Plain films of the ankle were done on Jun 10, 2008, that showed an      open fracture dislocation of the ankle.  8. Chest x-ray was done Jun 10, 2008, that showed no active      cardiopulmonary disease.  9. CT of the head without contrast was done on Jun 10, 2008, that      showed no acute intracranial abnormality, stable encephalomalacia      of the right temporal lobe.   10.A CT of the lower extremity was done on Jun 10, 2008, that showed      reduction of the open fracture dislocation some approximately 4 mm      lateral position of the talus relative to the articular surface of      the distal tibia, small bone fragments in the ankle joint medially,      oblique fracture of the distal fibula with comminution and      displacement.  11.Plain films of the right ankle were done on Jun 10, 2008, that      showed markedly improved position and alignment.  12.Plain films of the elbow were done Jun 10, 2008, that showed      negative 2-view exam.  13.Plain films of the right ankle were done on Jun 11, 2008, that      showed satisfactory plate fixation of the distal fibular fracture.  14.Plain films of the ankle were done June 13, 2008, that showed stable      and anatomic alignment of the right ankle.   BRIEF ADMISSION HISTORY AND PHYSICAL:  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  Per family, patient had a seizure on the day of admission and twisted  her ankle as she fell.  She fractured her ankle in the process and bones  were sticking out through the skin.  EMS was called and patient was  brought to the ED.  Per family, she was in the bathroom at the time and  her ankle was caught between the commode and the tub.  Also per family,  it was a generalized seizure with tongue biting.  In the ED, she had  another generalized seizure.  A Dilantin level was checked and it was  subtherapeutic at 6.3.  She was started on fosphenytoin load.  Lab work  in the ED also  revealed a potassium of 2.6.  Patient was placed on IV  potassium.  Orthopedics was consulted, saw the patient in the ED, and  requested admission to the medicine service.  Patient denied any fevers,  no  cough, no chest pain, no shortness of breath, no dysuria, no  diarrhea, no melena, no hematochezia.   PHYSICAL EXAM:  Per admitting physician, blood pressure of 115/65,  respiratory rate 15, pulse of 75, saturating 98% on room air.  GENERAL:  Patient is obese, elderly female in no apparent distress.  HEENT:  Normocephalic, atraumatic.  Pupils equal, round, and reactive to  light.  Extraocular movements intact.  Slightly dry mucous membranes.  No oral exudates.  NECK:  Supple.  No adenopathy.  No thyromegaly.  No JVD.  LUNGS:  Had decreased breath sounds at the bases, otherwise was clear to  auscultation.  CARDIOVASCULAR:  Regular rate and rhythm.  Normal S1 and S2.  ABDOMEN:  Soft, positive bowel sounds, nontender, nondistended.  No  organomegaly.  No masses palpable.  EXTREMITIES:  Right lower leg/foot was in a cast.  Left leg with no  cyanosis, no edema.  NEUROLOGICAL:  Patient was sleepy, aroused easily, oriented x3.  Cranial  nerves II-XII were grossly intact and nonfocal exam.   ADMISSION LABS:  CT scan of the head as stated above.  Chest x-ray with  no active cardiopulmonary disease.  Urinalysis was negative for  infection.  CBC with a white count of 9.5, hemoglobin 13.7, hematocrit  38.7, platelet count of 274, neutrophils of 64%, INR of 0.9, sodium 134,  potassium 2.6, chloride 995, bicarb 22, glucose 118, BUN 5, creatinine  0.67, and a calcium of 9.6 with a Dilantin level of 6.3.   HOSPITAL COURSE:  1. Recurrent seizures.  Patient was admitted with a recurrent seizure      disorder and was felt likely secondary to subtherapeutic Dilantin      level.  CT of the head was obtained as stated above.  Patient was      loaded with fosphenytoin and the case was discussed with Neurology      who had agreed that patient's subtherapeutic Dilantin level was      most likely the etiology of her seizures.  A urinalysis was      obtained which was negative.  Chest x-ray was negative and  was not      consistent with any infection.  Patient was placed on 400 mg of      Dilantin which was increased from her home dose of 350 and patient      was monitored and Neurology consult was obtained.  Patient was seen      in consultation by Dr. Sharene Skeans on June 13, 2008.  Patient remained      in stable condition and did not have any further episodes of      seizures then.  An EEG was also obtained with results as stated      above.  It was felt patient was stable to be discharged on Dilantin      400 mg daily with close followup with her neurologist, Dr.      Vickey Huger, in 2 weeks.  On followup, patient will need a free      Dilantin level checked and further recommendations will be deferred      to her neurologist.  2. Hypokalemia.  Patient was noted to be hypokalemic and hyponatremic.      On admission, it  was felt that patient's hypokalemia was likely      secondary to her diuretics.  Magnesium level was checked which was      within normal limits.  Patient's potassium was repleted.  Patient's      hydrochlorothiazide was held.  She was only maintained on captopril      for her blood pressure regimen and atenolol was further added.      Patient will need a basic metabolic profile to be checked by the      home nurse on June 19, 2008, and results called in or sent to her      PCP for further evaluation.  On day of discharge, patient's      potassium has been repleted and she will be discharged in stable      condition.  3. Hyponatremia.  Patient was noted to also be mildly hyponatremic.      It was felt this was secondary to being on a diuretic of      hydrochlorothiazide.  Her diuretics were held.  Patient was also      given some IV fluids with resolution of her hyponatremia.  4. Open fracture dislocation of the ankle.  Patient was noted to have      an open fracture dislocation of the ankle.  Orthopedics was      consulted.  Patient was seen in consultation by Dr. Chaney Malling.       Patient was taken to the operating room where she had a closed      reduction with irrigation to the wound of the right ankle done on      Jun 10, 2008.  Patient was also taken back to the OR by Dr. Thurston Hole      on Jun 11, 2008, where she had an ORIF of the right ankle and right      fibula then and also a right medial deltoid ligament repair was      done.  Patient's right ankle medial posterior tibial tendon      reticular repair was done.  A thorough I and D was also done and      the wound VAC was also placed.  Dr. Noelle Penner was consulted for wound      [facility] management.  He saw patient on June 15, 2008, and he had stated      that he will follow up with patient at Dr. Sherene Sires office for her      [facility] changes.  Patient remained in stable condition and was      monitored and followed during the hospitalization per orthopedics.      It was felt per orthopedics that the patient was stable for      discharge with followup as an outpatient.  5. Hypertension.  Patient during the hospitalization was noted to be      hypokalemic.  It was felt patient's hypokalemia was likely      secondary to a diuretic use.  Also causing the hyponatremia.      Patient's diuretics were held.  She was maintained on captopril and      atenolol was also added to her regimen.  Patient will be discharged      home on just atenolol and captopril.  Patient will get a BMET      checked on Monday, June 19, 2008, to follow up on electrolytes and      renal function.  Patient will also need to follow up  with her PCP      in 1 week to reassess her blood pressure regimen to see whether her      blood pressure is controlled just on captopril and atenolol.   The rest of patient's chronic medical issues remained stable throughout  the hospitalization and patient will be discharged in stable and  improved condition.   On day of discharge, vital signs, temperature 98.3, blood pressure  143/81, pulse of 66, respirations 20,  saturating 95% on room air.   DISCHARGE LABS:  Magnesium 2.1, sodium 135, potassium 4.2, chloride 98,  bicarb 28, BUN 5, creatinine 0.55, glucose of 118, and a calcium of 8.8.   It was a pleasure taking care of [name].      Ramiro Harvest, MD  Electronically Signed     DT/MEDQ  D:  06/16/2008  T:  06/16/2008  Job:  [id number]   cc:   Oley Balm. Georgina Pillion, M.D.  Robert A. Thurston Hole, M.D.  Melvyn Novas, M.D.  Loreta Ave, MD  Deanna Artis. Sharene Skeans, M.D.

## 2010-05-28 NOTE — Op Note (Signed)
Beth [name], Beth [name]                ACCOUNT NO.:  [id number]   MEDICAL RECORD NO.:  [id number]          PATIENT TYPE:  AMB   LOCATION:  DSC                          FACILITY:  [facility]   PHYSICIAN:  Loreta Ave, MD DATE OF BIRTH:  December 17, 1940   DATE OF PROCEDURE:  07/05/2008  DATE OF DISCHARGE:                               OPERATIVE REPORT   PREOPERATIVE DIAGNOSIS:  Right leg wound, complicated.   POSTOPERATIVE DIAGNOSIS:  Right leg wound, complicated.   PROCEDURE PERFORMED:  1. Debridement of skin and subcutaneous tissue.  2. Placement of Integra dermal template at 12 sq. cm.  3. Placement of VAC dressing, 12 sq. cm.   SURGEON:  Loreta Ave, MD   ASSISTANT:  None.   IV FLUIDS:  Per Anesthesia.   URINE OUTPUT:  Not recorded.   ESTIMATED BLOOD LOSS:  15 mL.   CLINICAL INDICATION:  Beth [name] is a 70 year old female who suffered  a fracture dislocation of her right ankle approximately 4 weeks ago.  She developed a wound over the medial aspect of the distal third of her  leg which was overlying the repair of tendons.  Her tendons were exposed  on the base of the wound.  She presents at this time for the beginning  of reconstruction.  She understands the risks of Integra placement to be  bleeding, infection, loss of the Integra, and the need for more surgery.  She understands these risks and desires to proceed.   DESCRIPTION OF THE OPERATION:  The patient was brought to the operating  room and placed in the supine position on the operating room table.  After a smooth and routine induction of general anesthesia, her right  posterior ankle splint was removed.  Please note that her right ankle  was maintained in 90 degrees of dorsiflexion without inversion of the  foot throughout the entire case until she was returned to a posterior  ankle splint.   The right lower extremity was prepped with Betadine paint and draped  into a sterile field.  The wound was measured  as being 6 x 2 cm with  exposed tendon at the base.  There is no erythema, and there is no  purulence.  The rim of her wound was excised with a 15 blade, and the  wound itself was curetted gently.  It was irrigated with normal saline.  Next, a piece of Integra dermal regeneration template was brought onto  the sterile field.  It was fenestrated approximately 15-20 times with a  15 blade to allow egress of blood from under the Integra.  It was then  placed silicone side up in the wound and trimmed to fit the defect.  It  was stapled in place.  Xeroform was applied over the  Integra as was a VAC dressing.  It was attached to suction and found to  be with good seal.  It was set to 125 mmHg continuous suction.  The  patient was then placed in a posterior ankle splint.  Sponge and needle  counts were reported as correct x2.  The patient was  then extubated and  transported to recovery room in stable condition.      Loreta Ave, MD  Electronically Signed     CF/MEDQ  D:  07/05/2008  T:  07/06/2008  Job:  [id number]

## 2010-05-28 NOTE — Procedures (Signed)
EEG NUMBER:  [id number].   CLINICAL HISTORY:  A 70 year old lady being evaluated for seizures.  Medication listed are Lexapro, Claritin, Dilantin, Zocor, Flonase,  Lovenox.   This is a 17-channel portable EEG recorded with the patient awake using  standard 10/20 electrode placement.   Background awake rhythm consists of 6-7 Hz theta which is diffuse and  symmetric.  Intermittent left temporal and parietal sharp waves and  theta slowing is seen, but no definite epileptiform features were noted.  Hyperventilation is not performed.  Photic stimulation was unremarkable.  Definite sleep stages are not seen in this recording.  Length of the  recording is 25.4 minutes.  Technical component is average.  EKG tracing  reveals regular sinus rhythm.   IMPRESSION:  This EEG performed during awake state is abnormal due to  presence of mild bihemispheric dysfunction as well as focal left  hemispheric mild cortical irritability.  No definite epileptiform  features were noted.           ______________________________  Sunny Schlein. Pearlean Brownie, MD     BJY:NWGN  D:  06/14/2008 18:18:42  T:  06/15/2008 05:14:21  Job #:  [id number]   cc:   [facility]

## 2010-05-28 NOTE — Consult Note (Signed)
NAMEKHALIYA, GOLINSKI                ACCOUNT NO.:  [id number]   MEDICAL RECORD NO.:  [id number]          PATIENT TYPE:  INP   LOCATION:  5034                         FACILITY:  [facility]   PHYSICIAN:  Deneen Harts, M.D.   DATE OF BIRTH:  10-30-1940   DATE OF CONSULTATION:  04/13/2007  DATE OF DISCHARGE:                                 CONSULTATION   REFERRING PHYSICIAN:  Dr. Lendell Caprice   REASON FOR CONSULTATION:  Seizures.   HISTORY OF PRESENT ILLNESS:  [name] is a 70 year old Caucasian  female with multiple medical problems, who presents with new onset  seizures x4 yesterday evening.  Yesterday at about 5:30 p.m. the patient  came out of the bedroom to go to dinner with her husband.  She was  expressionless, as per her husband and complained of some clicking noise  in her left ear.  She also complained of her left visual moving.  She  sat down and her symptoms resolved in a few minutes.  The patient then  later felt suddenly nauseated and dizzy and was diaphoretic.  She went  to smoke a cigarette and she had an episode where she started off.  Her  body turned to the left and she fell, being unresponsive.  Her body was  stiff originally and her jaw was clenched.  The episode lasted for about  5 to 10 minutes, per the husband.  She did bite her tongue with bladder  incontinence, after which she was very confused and disoriented and  agitated.  She had three further intermittent seizures as per the  husband, without returning to baseline.  She returned to normal within a  few hours later.  She has no history of head trauma or CSF infection.  She does have an old stroke in the right MCA distribution over one year  ago.  Her husband denies any recent focal weakness, numbness, speech  changes, swallowing problems, chewing problems or balance problems.  This history was given by the husband, secondary to the patient's  drowsiness from medications.   PAST MEDICAL HISTORY:  1. Positive for  stroke.  2. Hypertension.  3. Hyperlipidemia.  4. Depression.   CURRENT MEDICATIONS:  1. Dilantin.  2. Lexapro.  3. Zocor.  4. Atenolol.   ALLERGIES:  SULFA.   FAMILY HISTORY:  History is positive for strokes.   SOCIAL HISTORY:  Lives with her husband.  Smokes 1/2 pack of cigarettes  per day.  Denies any alcohol use.   REVIEW OF SYSTEMS:  Positive as per the history of present illness.  Negative as per the history of present illness for abdomen and other  organ systems.   PHYSICAL EXAMINATION:  VITAL SIGNS:  Temperature 98.8 degrees, pulse 76,  respirations 20, blood pressure 146/80, O2 saturation 98%.  HEENT:  Normocephalic and atraumatic.  Extraocular muscles intact.  Pupils equal, round, reactive to light.  The patient does have a tongue  laceration.  NECK:  Supple.  HEART:  Regular.  LUNGS:  Clear.  ABDOMEN:  Soft.  EXTREMITIES:  Good pulses.  NEUROLOGIC:  The patient is awake,  drowsy yet arousable.  She is  following commands appropriately.  Language is fluent.  Cranial nerves  II-XII  grossly intact.  Motor examination shows good strength in all  four extremities.  Sensory examination is within normal limits.  Light  touch reflexes are 2 to 3+ throughout and symmetric.  Toes are downgoing  bilaterally.  Cerebellar function and gait are unable to be assessed  secondary to safety.   A CT of the head showed an old right MCA infarction.   Electroencephalogram showed some right hemispheric slowing but no active  seizure activity.   LABORATORY DATA:  WBCs 14, hemoglobin 13.2, platelets 255.  AST 46, ALT  23.  Sodium 139, potassium 3, chloride 103, CO2 of 26, BUN 2, creatinine  0.40.   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  I agree that we will need to follow her  liver function tests.  If they are trending up with the Dilantin,  will  consider changing her to Keppra.  Will check an MRI of the brain to  further evaluate her seizures as well.  I instructed the patient not to  drive, with restrictions, secondary to her new onset seizures.  I  explained this to the husband as well.  I will follow the patient while  she is in the hospital.      Bevelyn Buckles. Nash Shearer, M.D.  Electronically Signed     ______________________________  Deneen Harts, M.D.    DRC/MEDQ  D:  04/13/2007  T:  04/13/2007  Job:  [id number]

## 2010-05-28 NOTE — Discharge Summary (Signed)
Beth [name], Beth [name]                ACCOUNT NO.:  [id number]   MEDICAL RECORD NO.:  [id number]          PATIENT TYPE:  INP   LOCATION:  5034                         FACILITY:  [facility]   PHYSICIAN:  Michiel Cowboy, MDDATE OF BIRTH:  12-15-1940   DATE OF ADMISSION:  04/12/2007  DATE OF DISCHARGE:  04/16/2007                               DISCHARGE SUMMARY   DISCHARGE DIAGNOSES:  1. Seizure.  2. 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  3. Hypertension.  4. Depression.  5. Tobacco abuse.  6. Sinusitis.   CONSULTANTS:  Neurology:  Bevelyn Buckles. Nash Shearer, M.D.  Oley Balm Georgina Pillion, M.D.   STUDIES:  EEG done on April 13, 2007, showing EEG abnormal secondary to  right hemispheric slowing suggestive of underlying structured lesion.   CT of her spine and head done on April 12, 2007, showing negative for  fracture.   CT of brain showed chronic infarct in the right temporal and parietal  lobe, mild chronic ischemia present in the white matter bilaterally,  chronic right anterior infarct.   CT scan reveals sinusitis in maxillary sinuses.   Chest x-ray done April 13, 2007, showed mild bibasilar atelectasis.   MRI of the brain done on April 13, 2007, showed old infarction in the  right temporal lobe between the right middle cerebral artery territory  and old low level blood  products related to that.  No sign of acute  infarction or acute hemorrhage.  The area of infarction could certainly  cause a seizure focus.   BRIEF HISTORY AND HOSPITAL COURSE:  This is a 70 year old female who  presented with new onset seizures.  The patient had a sensation of being  hot and nauseated, then went to the kitchen to smoke a cigarette, and  was noted to have a seizure and fell over and hit her head.  The patient  had mental status changes including post ictal state and was brought  into emergency department where she was admitted by [facility] hospitalist.   Neurology  consult was called.  EEG was ordered.  CT scan and MRI of the  brain was done that showed a right-sided old stroke which is most likely  the cause of seizure focus.  The patient was admitted and started on  Dilantin, first load of her IV dose and switched to p.o.  She will be  discharged on Dilantin to home.  Followup with her primary care doctor  to obtain her Dilantin level initially and then followup with neurology  in 3-4 weeks.  The results of her Dilantin level to be faxed to  neurology.   Also during hospitalization, her CK level was noted to be elevated first  felt to be secondary to seizure.  The patient continued to have muscle  aches and her CK level was rechecked which was still elevated at 1117.  The patient is to be discharged to home.  We will hold off on her statin  to prevent confusion, and have her CK levels followed up as an  outpatient.  Clinically,  the patient is doing better and her muscle pain  has resolved.   Thrush.  Prior to discharge, it was noted that the patient has somewhat  whitish discharge in her mouth.  She was discharged on nystatin swish  and spit, to be taken for 7 days.   It was noted that the patient had sinusitis.  At the time of discharge,  the patient's symptoms were improving without use of antibiotics, so she  was not started on antibiotics, especially given her extensive  allergies.  If the symptoms continue to worsen, defer to primary care  Emanuele Mcwhirter if he feels that sinusitis needs to be treated.  For now, we  will discharge her on Flonase.   DISCHARGE MEDICATIONS:  1. The patient to hold her simvastatin until evaluated by primary care      Darria Corvera.  2. Lexapro 10 mg p.o. daily.  3. Aspirin 325 mg p.o. daily.  4. Captopril/hydrochlorothiazide 50/15 mg p.o. twice a day.  5. Flonase spray twice a day.  6. We will hold off Mucinex.  7. Dilantin 100 mg p.o. daily.  8. Nystatin 500,000 units per 5 mL, swish and spit q.i.d. x7 days.    The patient is to have Dilantin level checked on April 22, 2007, as well  as CK level to be checked.   FOLLOWUP:  Followup appointment with Dr. Georgina Pillion and Dr. Nash Shearer.      Michiel Cowboy, MD  Electronically Signed     AVD/MEDQ  D:  04/16/2007  T:  04/17/2007  Job:  [id number]   cc:   Estanislado Pandy, MD  Oley Balm Georgina Pillion, M.D.

## 2010-05-28 NOTE — Procedures (Signed)
EEG NUMBER:  [id number]   HISTORY:  This is a 70 year old with known seizure versus syncope who is  having an EEG done to evaluate for seizure activity.   PROCEDURE:  This is a routine EEG.   TECHNICAL DESCRIPTION:  Throughout this routine EEG, the patient starts  off sleep during the tracing with the appearance of symmetric sleep  spindles.  During this time, the background does seem slightly  asymmetric with more slowing noticed over the right hemisphere when  compared to the left.  With photic stimulation, there is symmetric  photic driving response noted.  Hyperventilation was not performed  throughout this record and throughout this tracing there is no  definitive epileptiform activity or electrographic seizures noted.  The  EKG tracing shows a heart rate of 60 to 80 beats per minute.   IMPRESSION:  This routine EEG is abnormal secondary to right hemispheric  slowing.  This right hemispheric slowing is suggestive of an underlying  structural lesion.  Clinical correlation is advised.      Bevelyn Buckles. Nash Shearer, M.D.  Electronically Signed     WJX:BJYN  D:  04/13/2007 13:33:01  T:  04/13/2007 13:56:40  Job #:  [id number]

## 2010-05-28 NOTE — H&P (Signed)
NAMEJOSIAH, WOJTASZEK NO.:  [id number]   MEDICAL RECORD NO.:  [id number]          PATIENT TYPE:  EMS   LOCATION:  MAJO                         FACILITY:  [facility]   PHYSICIAN:  Hollice Espy, M.D.DATE OF BIRTH:  October 28, 1940   DATE OF ADMISSION:  04/12/2007  DATE OF DISCHARGE:                              HISTORY & PHYSICAL   PRIMARY CARE PHYSICIAN:  Oley Balm. Georgina Pillion, M.D.   CHIEF COMPLAINT:  Syncope.   HISTORY OF PRESENT ILLNESS:  Patient is a 70 year old white female with  a past medical history of hypertension and depression who for the past  several weeks has had some vague complaints of just not feeling well,  very weak and looked to be, according to the daughter, having problems  with indecision and confusion and complaining of joint pain for the last  several days.  This evening, she was getting ready to go out to dinner  with the husband when all of a sudden she felt very weak and ill.  She  said that she feels feverish.  She went over to the bathroom to sit down  when her husband commented that he thought she felt quite hot to touch.  She got up and walked over to the kitchen.  In the kitchen, all of a  sudden, witnessed by her husband and daughter, she turned around and  just all of a sudden stared at them for a second and then fell over.  She landed, hitting her head on the countertop and then fell backwards,  and then looked to be there was an episode of bladder incontinence at  some point, as her pants were then soaked with urine.  She was brought  in by paramedics to the emergency room.  Apparently, en route, she  suffered what looked to be four separate events of seizure-like activity  with shaking of all extremities.  The patient was given several doses of  Ativan, both en route and in the emergency room.  She currently is  heavily sedated and unable to communicate any kind of review of systems,  although she occasionally moans.  In terms of an  evaluation of her, she  was noted to be slightly hypokalemic with a potassium of 2.8.  Her CPK  levels were elevated, consistent with mild rhabdomyolysis from shaking  of her extremities.  The only other labs of note was a mildly elevated  white count of 12.   The patient is unable to give me any current review of systems.  In  terms of trauma, she has a small laceration of her inner lip consistent  with a bite.  This did not require suturing.   The CT scan of her head was unremarkable.   PAST MEDICAL HISTORY:  1. Hypertension.  2. Depression.  3. Tobacco abuse.   MEDICATIONS:  1. Simvastatin 10.  2. Lexapro 10.  3. Atenolol 50 b.i.d.  4. Aspirin 81.  5. Allegra 180.  6. Lasix 40.  7. Captopril/HCTZ 50/25.  8. Flonase twice daily.  9. Mucinex p.r.n.  10.Chantx.   She has an  allergy to SULFA.   SOCIAL HISTORY:  She has gone from a pack and a half a day down to half  a pack daily.  No reported history of alcohol or drug use.   FAMILY HISTORY:  Noncontributory.   PHYSICAL EXAMINATION:  VITALS ON ADMISSION:  Temp 97.5, heart rate 87,  blood pressure 148/87, respirations 18, O2 sat 100% on room air.  GENERAL:  She is currently drowsy and somnolent from receiving multiple  doses of Ativan.  HEENT:  Normocephalic.  She has evidence of a inner lip bite, which is  small, otherwise no other trauma.  Mucous membranes are dry.  She has no  carotid bruits.  HEART:  Regular rate and rhythm.  S1 and S2.  LUNGS:  Clear to auscultation bilaterally.  Limited secondary to her body habitus, abdomen is soft, nontender and  nondistended.  Positive bowel sounds.  EXTREMITIES:  No clubbing or cyanosis.  Trace pitting edema.   LAB WORK:  White count 12.7, H&H 14.8 and 43, MCV 100, platelet count  265, 80% neutrophils.  Sodium 132, potassium 2.8, chloride 96, bicarb  24, BUN 4, creatinine 0.6, glucose 108.  CPK greater than 500.  MB 29.  Troponin I less than 0.05.  Isolated level CPK 617.   MB 52.  Troponin I  0.02.  Urinalysis notes trace leukocyte esterase but rare bacteria.   CT scan of the head shows evidence of an old infarct, chronic right MCA.  No acute intracranial abnormality.  Patient is noted to have acute  sinusitis.   ASSESSMENT/PLAN:  1. New onset seizures, cause?  No evidence of new cerebrovascular      accident by CT, although this could be erosion from her old      cerebrovascular accident versus this could be medication related      versus this could be secret alcohol use.  Will check an EEG.  Load      with Dilantin and p.r.n. Ativan.  Noting that the patient's MCV is      at borderline macrocytic with 100, it is possible she could having      secretive alcohol abuse and will need to confirm this when she is      more alert.  2. Hypertension:  Will hold until she is more awake.  3. Depression:  Lexapro.  Will review meds to determine if Lexapro or      Chantx could be causes for lowering seizure threshold.  4. Tobacco abuse.      Hollice Espy, M.D.  Electronically Signed     SKK/MEDQ  D:  04/13/2007  T:  04/13/2007  Job:  [id number]   cc:   Oley Balm. Georgina Pillion, M.D.

## 2010-05-28 NOTE — H&P (Signed)
NAMETHERSEA, MANFREDONIA                ACCOUNT NO.:  [id number]   MEDICAL RECORD NO.:  [id number]          PATIENT TYPE:  EMS   LOCATION:  MAJO                         FACILITY:  [facility]   PHYSICIAN:  Kela Millin, M.D.DATE OF BIRTH:  July 22, 1940   DATE OF ADMISSION:  06/10/2008  DATE OF DISCHARGE:                              HISTORY & PHYSICAL   PRIMARY CARE PHYSICIAN:  Dr. Lajean Manes.   CHIEF COMPLAINT:  Seizure, fall with open ankle fracture.   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  Per family the patient had a seizure today  and twisted her ankle as she fell.  She fractured her ankle in the  process and the bones were sticking out through the scan.  EMS was  called and the patient brought to the ED.  Per family she was in the  bathroom at the time and her ankle was caught between the commode and  the tub.  Also per family it was a generalized seizure with tongue  biting.  In the ED she had another generalized seizure.  A Dilantin  level was checked and it was subtherapeutic at 6.3.  She was started on  a fosphenytoin load.  Lab work in the ED also revealed a potassium of  2.6 and the patient was started on IV potassium.  Orthopedics was  consulted and they saw the patient in the ED and requested admission to  the medicine service.  She denies fevers, cough, chest pain, shortness  of breath, dysuria, diarrhea, melena and no hematochezia.   PAST MEDICAL HISTORY:  As per HPI.   MEDICATIONS:  1. Captopril/ HCTZ 50 slash 15 mg p.o. b.i.d.  2. Zocor 10 mg p.o. daily.  3. Atenolol 50 mg p.o. b.i.d.  4. Lexapro 20 mg p.o. daily.  5. Dilantin 350 mg p.o. q.h.s.  6. Allegra 180 mg p.o. daily.  7. Aspirin 325 mg p.o. daily.  8. Flonase 2 sprays in each nostril  daily.   ALLERGIES:  AUGMENTIN AND SULFA.   SOCIAL HISTORY:  Positive for tobacco, denies alcohol.   FAMILY HISTORY:  Noncontributory to current illness.   REVIEW OF SYSTEMS:  As per HPI, other review of systems negative.   PHYSICAL EXAMINATION:  GENERAL:  The patient is an obese elderly white  female in no apparent distress.  VITAL SIGNS:  Blood pressure is 115/65 with respiratory rate of 15,  pulse of 75, O2 saturation  of 98% on room air.  HEENT:  PERRL, EOMI, slightly dry mucous membranes.  No oral exudates.  NECK:  Supple, no adenopathy, no thyromegaly and no JVD.  LUNGS:  Decreased breath sounds at the bases, otherwise clear to  auscultation.  CARDIOVASCULAR:  Regular rate and rhythm.  Normal S1-S2.  ABDOMEN:  Soft, bowel sounds present, nontender, nondistended.  No  organomegaly and no masses palpable.  EXTREMITIES:  The right lower leg / foot is  in a cast.  Left leg -  no  cyanosis and no edema.  NEURO:  Sleepy, arouses easily, oriented x3 and cranial nerves II-XII  grossly intact, nonfocal exam.   LABORATORY DATA:  CT scan of the head - no acute intracranial  abnormality.  Stable encephalomalacia of the right temporal lobe.  Chest  x-ray - no active cardiopulmonary disease.   Urinalysis is negative for infection.  White cell count is 9.5,  hemoglobin 13.7, hematocrit of 38.7, platelet count 274, neutrophil  count 64%.  INR is 0.9.  Sodium is 134, potassium 2.6, chloride of 995,  CO2 of 22, glucose of 118, BUN of 5, creatinine of 0.67, calcium 9.6.  Dilantin level 6.3.   ASSESSMENT AND PLAN:  1. Seizures, recurrent - in patient with history of seizure disorder      presenting with a subtherapeutic Dilantin level.  She has been      loaded with fosphenytoin.  I discussed the patient with neurology      who agrees that this subtherapeutic Dilantin is the most likely      cause of seizures at this time.  Urinalysis and chest x-ray were      not consistent with infection.  Dr.  Roseanne Reno  on call for neurology      recommends starting the patient on 400 mg q.h.s. of Dilantin      (instead of the 350 she was on previously).  He does not recommend      any further evaluation and this time.  2. Hypokalemia - likely secondary to diuretics.  Replace potassium,      follow and recheck.  3. Hyponatremia - mild, also likely secondary to diuretic.  Follow and      recheck.  Holding diuretics at this time.  4. Open fracture dislocation of the ankle with compromised blood flow      - per orthopedics.  5. History of hypertension - monitor blood pressures and treat      accordingly.  Follow and resume outpatient medications once      restarted on p.o.  6. History of depression - continue Lexapro.  7. History of tobacco abuse - smoking cessation counseling.      Kela Millin, M.D.  Electronically Signed     ACV/MEDQ  D:  06/10/2008  T:  06/10/2008  Job:  [id number]   cc:   Oley Balm. Georgina Pillion, M.D.

## 2010-05-28 NOTE — Consult Note (Signed)
NAMECAMBRIA, OSTEN                ACCOUNT NO.:  [id number]   MEDICAL RECORD NO.:  [id number]          PATIENT TYPE:  INP   LOCATION:  3111                         FACILITY:  [facility]   PHYSICIAN:  Lenard Galloway. Mortenson, M.D.DATE OF BIRTH:  1940-05-15   DATE OF CONSULTATION:  06/10/2008  DATE OF DISCHARGE:                                 CONSULTATION   REFERRING PHYSICIAN:  Emergency room doctor, Dr. Oletta Lamas.   CHIEF COMPLAINT:  Painful right ankle.   HISTORY:  Ms. 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  At that  time, it was a traumatic open ankle and she was brought to the emergency  room where she was noted to have a distal fibular fracture and a grade 3  open fracture dislocation of her right ankle.  We were called to the  emergency room for evaluation and treatment options.   PAST MEDICAL HISTORY:  She is posticteric at this time and not  responsive.   PAST MEDICAL HISTORY:  Positive for:  1. Cerebrovascular accident.  2. Depression.  3. Hypertension.  4. Seizures.  5. Sinusitis.   REVIEW OF SYSTEMS:  As noted above in the past medical history.   ALLERGIES:  1. AUGMENTIN.  2. SULFA.   MEDICATIONS:  Include that of Allegra, aspirin, atenolol,  Captopril/hydrochlorothiazide, Celebrex, Chantix, Flonase, furosemide,  Lexapro, Mucinex, and simvastatin.   FAMILY HISTORY:  Noncontributory.   SOCIAL HISTORY:  She is a 70 year old white married female who denies  the use of tobacco or alcohol.   EXAMINATION:  Today, reveals a posticteric 70 year old white female well-  developed, well-nourished, obese and unresponsive at this time.  VITAL SIGNS:  Are reviewed in the chart.  RIGHT ANKLE:  Reveals a grade 3 open medial wound on the right ankle.  There was  profound deformity with a pulseless right foot.  Initially, a  closed manipulation was performed and some resolution of the pulses were  obtained.  Unable to get a full neuro exam on her since she is  posticteric.  Splint was put on at that time.   X-rays were obtained post reduction which showed the fracture  dislocation to be fairly reduced and acceptable at this time.   CLINICAL IMPRESSION:  Grade 3 open right ankle fracture with distal  fibula fracture.   RECOMMENDATIONS:  1. At this time, we are going to try to plan on taking her to the      operating room for formal open debridement and ORIF.  2. We have discussed this with anesthesia and they are quite concerned      of the fact that she is posticteric, and she also had a potassium      of 2.6, and would prefer not to give her an anesthetic until she is      fully alert and her potassium as above  3.0.  We will delay at this      time until we can obtain resolution of the above.      Oris Drone Petrarca, P.A.-C.      Rodney A. Chaney Malling, M.D.  Electronically Signed    BDP/MEDQ  D:  06/11/2008  T:  06/11/2008  Job:  [id number]

## 2010-05-28 NOTE — Op Note (Signed)
NAMEKALLIOPI, COUPLAND                ACCOUNT NO.:  [id number]   MEDICAL RECORD NO.:  [id number]          PATIENT TYPE:  INP   LOCATION:  3305                         FACILITY:  [facility]   PHYSICIAN:  Elana Alm. Thurston Hole, M.D. DATE OF BIRTH:  06-18-1940   DATE OF PROCEDURE:  06/11/2008  DATE OF DISCHARGE:                               OPERATIVE REPORT   PREOPERATIVE DIAGNOSIS:  Right ankle open fracture dislocation.   POSTOPERATIVE DIAGNOSIS:  Right ankle open fracture dislocation.   PROCEDURE:  1. Open reduction internal fixation of right ankle fracture      dislocation with open reduction internal fixation of right fibula      fracture.  2. Right medial deltoid ligament repair.  3. Right ankle medial posterior tibial tendon retinacular repair.  4. Thorough irrigation and debridement, open right wound.  5. Application of wound VAC, right open ankle wound.   SURGEON:  Salvatore Marvel, MD   ASSISTANT:  Julien Girt, P.A.   ANESTHESIA:  General.   OPERATIVE TIME:  1 hour 30 minutes.   COMPLICATIONS:  None.   DESCRIPTION OF PROCEDURE:  Ms. Distel was brought to the operating room  on Jun 11, 2008, after being cleared preoperatively by Internal  Medicine.  She was placed on operative table in supine position.  After  adequate level of general anesthesia obtained, her right foot and leg  was prepped using Betadine and draped using sterile technique.  She had  a large open medial wound measuring 4 x 5 cm with significant  instability of the right ankle.  Through this open wound, the entire  medial ankle joint could be visualized.  The entire deltoid ligament was  torn, both deep and superficial portions.  The retinaculum of the  posterior tibial tendon was also torn with slight subluxation of the  posterior tibial tendon.  At this point, thorough irrigation and  debridement of this open wound was carried out with 1500 mL of sterile  saline.  Debridement of the contaminated tissues  was also carried out as  well.  At this point, the fibular fracture was exposed through a 4-5 cm  longitudinal incision of the distal fibula.  The underlying subcutaneous  tissues were incised in line with a skin incision.  The peroneal tendons  and sural nerve were carefully protected while the underlying  tibiofibular fracture was exposed.  It was irrigated and then reduced in  an anatomic position and held there with a reduction clamp.  A 4.0-mm  anterior-to-posterior lag screw was then placed for provisional fixation  of the appropriate length and then a 6-hole one-third tubular plate was  placed on the fibula.  The 2 most distal screw holes drilled, measured,  tapped and the appropriate length 4.0-mm cancellous screws placed and  the 2 most proximal screw holes drilled, measured, tapped, and the  appropriate length 3.5-mm cortical screws placed.  At this point,  intraoperative fluoroscopy confirmed anatomic reduction of the fibular  fracture and satisfactory position of the hardware.  After this was  done, then the medial wound and trauma was further visualized.  At this  point, the medial deltoid ligament was repaired primarily with 2  separate mattress locking #1 Vicryl sutures repairing the tendon with a  excellent repair.  The retinaculum over the posterior tibial tendons was  then repaired as well with separate #1 Vicryl mattress sutures as well.  The anterior and anteromedial joint capsule was then repaired as well to  the periosteum and the anteromedial tibia with #1 Vicryl suture as well.  After this was done, intraoperative fluoroscopy confirmed anatomic  reduction of the fractures and anatomic reduction of the mortise.  The  mortise remained anatomic throughout range of motion and the syndesmosis  being stressed was not unstable.  At this point, it was felt that all  pathology being satisfactorily addressed.  The wounds were further  irrigated.  The fibula incision was  closed with 2-0 Vicryl closing the  periosteum over the fibular plate and then 2-0 Vicryl in the  subcutaneous tissues and skin staples on the skin.  The medial wound had  only the most posterior inferior portion of this closed loosely with no  tension.  The anterior one-half of the wound could not be closed and  thus a wound VAC was placed on this area.  The tourniquet had been  released.  There was excellent viability of the foot and leg with  posterior tibial and anterior tibial pulses intact.  A posterior splint  was then placed over the wound VAC.  The patient was then awakened,  extubated, and taken to recovery room in stable condition.  Needle and  sponge counts correct x2 at the end of the case.      Robert A. Thurston Hole, M.D.  Electronically Signed     RAW/MEDQ  D:  06/11/2008  T:  06/12/2008  Job:  [id number]

## 2010-05-28 NOTE — Op Note (Signed)
Beth [name], Beth [name]                ACCOUNT NO.:  [id number]   MEDICAL RECORD NO.:  [id number]          PATIENT TYPE:  AMB   LOCATION:  DSC                          FACILITY:  [facility]   PHYSICIAN:  Loreta Ave, MD DATE OF BIRTH:  [birth year]   DATE OF PROCEDURE:  07/20/2008  DATE OF DISCHARGE:                               OPERATIVE REPORT   SURGEON:  Loreta Ave, MD   ASSISTANT:  None.   PREOPERATIVE DIAGNOSIS:  Right leg wound, complicated.   POSTOPERATIVE DIAGNOSIS:  Right leg wound, complicated.   PROCEDURE PERFORMED:  1. Preparation of recipient bed, 14 cm2.  2. Split-thickness skin graft from the right side to the right leg, 14      cm2.  3. Application of the vacuum-assisted closure.   ESTIMATED BLOOD LOSS:  Minimal.   DRAINS:  None.   URINE OUTPUT:  Not recorded.   COMPLICATIONS:  None.   CLINICAL INDICATIONS:  Beth [name] is a 70 year old Caucasian female  with a right leg wound, secondary to trauma.  She underwent orthopedic  repair of medial ankle tendons and her traumatic wound was overlying the  repair.  Previously, I placed Integra on the wound and a VAC dressing.  Her Integra is well incorporated at this point and she presents for  definitive coverage of the split-thickness skin graft.   DESCRIPTION OF OPERATION:  The patient was brought to the operating room  and placed in the supine position on the operating room table.  After a  smooth and routine induction of general anesthesia with laryngeal mask  airway, the right lower extremity was prepped with Betadine paint after  the silicone layer of the Integra had been removed.  The right leg was  then draped into a sterile field.  The wound was measured as being 2.5 x  7 cm.  The wound bed was gently abraded with the back of a 15 blade to  produce bleeding and remove the surface layer.  Next, a 12 x 1000 x 7  inch skin graft was harvested from the right thigh with a Zimmer  dermatome.  It was  meshed in a 1.5-1 fashion and placed dermal side down  and the right leg wound.  It was trimmed to fit and sewn in with a  running 4-0 chromic suture.  After skin graft harvest, a lap sponge  soaked in epinephrine containing normal saline was applied topically to  the right thigh wound for hemostasis.  Next, the right thigh wound was  dressed with Benzoin and Tegaderm, and a piece of Xeroform was placed on  the right leg skin graft as well as the VAC dressing.  The VAC dressing  was placed to suction with good seal and set to a 125 mmHg continuous  suction.  Next, the posterior ankle splint was applied from the toes to  just distal to the knee.  Please note that during the entire  operation, manual pressure/reduction was held on the right foot  maintaining a 90 degrees of dorsiflexion without inversion or eversion.  At this point, sponge and needle counts were  reported as correct and the  patient was extubated and transferred to the recovery room in stable  condition.      Loreta Ave, MD  Electronically Signed     Loreta Ave, MD  Electronically Signed    CF/MEDQ  D:  07/20/2008  T:  07/20/2008  Job:  [id number]

## 2010-05-31 NOTE — Op Note (Signed)
Beth [name], Beth [name]                ACCOUNT NO.:  [id number]   MEDICAL RECORD NO.:  [id number]          PATIENT TYPE:  AMB   LOCATION:  ENDO                         FACILITY:  [facility]   PHYSICIAN:  Graylin Shiver, M.D.   DATE OF BIRTH:  10/03/1940   DATE OF PROCEDURE:  10/11/2003  DATE OF DISCHARGE:                                 OPERATIVE REPORT   PROCEDURE:  Colonoscopy with polypectomy.   SURGEON:   INDICATIONS FOR PROCEDURE:  Screening.   Informed consent was obtained after explanation of the risks, bleeding,  infection and perforation.   PREMEDICATION:  Fentanyl 80 mcg IV and Versed 8 mg IV.   DESCRIPTION OF PROCEDURE:  With the patient in the left lateral decubitus  position, a rectal examination was performed.  No masses were felt.  The  Olympus colonoscope was inserted into the rectum and advanced around to the  colon to the cecum.  The cecal landmarks were identified.  The cecum and  ascending colon were normal.  The transverse colon was normal.  In the  descending colon, there was a 5 mm sessile polyp snared and removed by snare  cautery technique.  In the sigmoid there were there polyps.  Two were  sessile and approximately 5 to 6 mm in size.  These were snared and removed  by snare cautery technique.  The other was an 8 to 10 mm pedunculated polyp  snared and removed by snare cautery technique.  The polyps were retrieved.  The cautery sites looked good.  The rectum looked normal.  The patient  tolerated the procedure well without complications.   IMPRESSION:  Colon polyps, code 211.3.   PLAN:  The pathology will be checked.       SFG/MEDQ  D:  10/11/2003  T:  10/11/2003  Job:  [id number]   cc:   Oley Balm. Georgina Pillion, M.D.  [address]  [place]  [place] [address]  Fax: [phone]

## 2010-10-07 LAB — POCT CARDIAC MARKERS
Myoglobin, poc: >500
Operator id: 277751

## 2010-10-07 LAB — BASIC METABOLIC PANEL
CO2: 24
Calcium: 9.1
Creatinine, Ser: 0.63
Glucose, Bld: 108 — ABNORMAL HIGH

## 2010-10-07 LAB — COMPREHENSIVE METABOLIC PANEL
Albumin: 3.1 — ABNORMAL LOW
BUN: 2 — ABNORMAL LOW
Creatinine, Ser: 0.42
Total Protein: 5.4 — ABNORMAL LOW

## 2010-10-07 LAB — CK TOTAL AND CKMB (NOT AT ARMC): Relative Index: 8.4 — ABNORMAL HIGH

## 2010-10-07 LAB — CARDIAC PANEL(CRET KIN+CKTOT+MB+TROPI)
Relative Index: 5.6 — ABNORMAL HIGH
Troponin I: 0.05

## 2010-10-07 LAB — URINALYSIS, ROUTINE W REFLEX MICROSCOPIC
Bilirubin Urine: NEGATIVE
Glucose, UA: NEGATIVE
Hgb urine dipstick: NEGATIVE
Protein, ur: 30 — AB

## 2010-10-07 LAB — DIFFERENTIAL
Basophils Absolute: 0.4 — ABNORMAL HIGH
Basophils Relative: 3 — ABNORMAL HIGH
Eosinophils Absolute: 0.1
Monocytes Absolute: 0.7
Neutro Abs: 10.2 — ABNORMAL HIGH
Neutrophils Relative %: 80 — ABNORMAL HIGH

## 2010-10-07 LAB — TROPONIN I: Troponin I: 0.02

## 2010-10-07 LAB — CBC
HCT: 38.1
Hemoglobin: 14.8
MCHC: 34.8
MCV: 100.3 — ABNORMAL HIGH
Platelets: 257
RDW: 13.6
RDW: 14.2

## 2010-10-07 LAB — URINE MICROSCOPIC-ADD ON

## 2010-10-07 LAB — POTASSIUM: Potassium: 2.9 — ABNORMAL LOW

## 2010-10-08 LAB — VITAMIN B12: Vitamin B-12: 1557 — ABNORMAL HIGH (ref 211–911)

## 2010-10-08 LAB — COMPREHENSIVE METABOLIC PANEL
ALT: 22
AST: 32
AST: 42 — ABNORMAL HIGH
Albumin: 2.8 — ABNORMAL LOW
Alkaline Phosphatase: 40
BUN: 1 — ABNORMAL LOW
BUN: 2 — ABNORMAL LOW
CO2: 23
CO2: 25
Calcium: 8.4
Chloride: 102
Chloride: 103
Creatinine, Ser: 0.4
Creatinine, Ser: 0.44
GFR calc Af Amer: >60
GFR calc non Af Amer: >60
GFR calc non Af Amer: >60
Glucose, Bld: 95
Potassium: 3.6
Sodium: 137
Total Bilirubin: 0.8
Total Bilirubin: 0.9
Total Protein: 6

## 2010-10-08 LAB — BASIC METABOLIC PANEL
CO2: 21
Chloride: 106
Glucose, Bld: 96
Potassium: 3.3 — ABNORMAL LOW
Sodium: 139

## 2010-10-08 LAB — DIFFERENTIAL
Basophils Absolute: 0.1
Basophils Relative: 1
Eosinophils Relative: 1
Lymphocytes Relative: 16
Neutro Abs: 9.2 — ABNORMAL HIGH

## 2010-10-08 LAB — LIPID PANEL
HDL: 47
LDL Cholesterol: 84
Triglycerides: 75
VLDL: 15

## 2010-10-08 LAB — CBC
HCT: 38
Hemoglobin: 13.2
Hemoglobin: 13.3
Hemoglobin: 13.3
MCHC: 34.8
MCHC: 34.8
MCV: 100.2 — ABNORMAL HIGH
MCV: 100.2 — ABNORMAL HIGH
Platelets: 261
RBC: 3.79 — ABNORMAL LOW
RBC: 3.82 — ABNORMAL LOW
RDW: 13.8
RDW: 14
WBC: 10.7 — ABNORMAL HIGH

## 2010-10-08 LAB — MAGNESIUM: Magnesium: 1.8

## 2010-10-08 LAB — CK TOTAL AND CKMB (NOT AT ARMC)
CK, MB: 5.5 — ABNORMAL HIGH
Relative Index: 0.5
Total CK: 1117 — ABNORMAL HIGH

## 2011-01-23 DIAGNOSIS — H26499 Other secondary cataract, unspecified eye: Secondary | ICD-10-CM | POA: Diagnosis not present

## 2011-01-23 DIAGNOSIS — H524 Presbyopia: Secondary | ICD-10-CM | POA: Diagnosis not present

## 2011-02-06 DIAGNOSIS — H26499 Other secondary cataract, unspecified eye: Secondary | ICD-10-CM | POA: Diagnosis not present

## 2011-03-13 DIAGNOSIS — E559 Vitamin D deficiency, unspecified: Secondary | ICD-10-CM | POA: Diagnosis not present

## 2011-03-13 DIAGNOSIS — E7800 Pure hypercholesterolemia, unspecified: Secondary | ICD-10-CM | POA: Diagnosis not present

## 2011-03-13 DIAGNOSIS — I10 Essential (primary) hypertension: Secondary | ICD-10-CM | POA: Diagnosis not present

## 2011-03-13 DIAGNOSIS — R7301 Impaired fasting glucose: Secondary | ICD-10-CM | POA: Diagnosis not present

## 2011-03-19 DIAGNOSIS — G40319 Generalized idiopathic epilepsy and epileptic syndromes, intractable, without status epilepticus: Secondary | ICD-10-CM | POA: Diagnosis not present

## 2011-03-19 DIAGNOSIS — I669 Occlusion and stenosis of unspecified cerebral artery: Secondary | ICD-10-CM | POA: Diagnosis not present

## 2011-03-19 DIAGNOSIS — Z79899 Other long term (current) drug therapy: Secondary | ICD-10-CM | POA: Diagnosis not present

## 2011-03-26 DIAGNOSIS — H02839 Dermatochalasis of unspecified eye, unspecified eyelid: Secondary | ICD-10-CM | POA: Diagnosis not present

## 2011-04-22 DIAGNOSIS — H02839 Dermatochalasis of unspecified eye, unspecified eyelid: Secondary | ICD-10-CM | POA: Diagnosis not present

## 2011-04-29 DIAGNOSIS — H5340 Unspecified visual field defects: Secondary | ICD-10-CM | POA: Diagnosis not present

## 2011-04-29 DIAGNOSIS — H02409 Unspecified ptosis of unspecified eyelid: Secondary | ICD-10-CM | POA: Diagnosis not present

## 2011-04-29 DIAGNOSIS — H02839 Dermatochalasis of unspecified eye, unspecified eyelid: Secondary | ICD-10-CM | POA: Diagnosis not present

## 2011-04-29 DIAGNOSIS — F341 Dysthymic disorder: Secondary | ICD-10-CM | POA: Diagnosis not present

## 2011-04-29 DIAGNOSIS — I10 Essential (primary) hypertension: Secondary | ICD-10-CM | POA: Diagnosis not present

## 2011-04-29 DIAGNOSIS — E669 Obesity, unspecified: Secondary | ICD-10-CM | POA: Diagnosis not present

## 2011-04-29 DIAGNOSIS — Z79899 Other long term (current) drug therapy: Secondary | ICD-10-CM | POA: Diagnosis not present

## 2011-04-29 DIAGNOSIS — Z6836 Body mass index (BMI) 36.0-36.9, adult: Secondary | ICD-10-CM | POA: Diagnosis not present

## 2012-01-23 DIAGNOSIS — E559 Vitamin D deficiency, unspecified: Secondary | ICD-10-CM | POA: Diagnosis not present

## 2012-01-23 DIAGNOSIS — R7301 Impaired fasting glucose: Secondary | ICD-10-CM | POA: Diagnosis not present

## 2012-01-23 DIAGNOSIS — E7800 Pure hypercholesterolemia, unspecified: Secondary | ICD-10-CM | POA: Diagnosis not present

## 2012-01-23 DIAGNOSIS — I10 Essential (primary) hypertension: Secondary | ICD-10-CM | POA: Diagnosis not present

## 2012-01-27 DIAGNOSIS — R7301 Impaired fasting glucose: Secondary | ICD-10-CM | POA: Diagnosis not present

## 2012-01-27 DIAGNOSIS — I10 Essential (primary) hypertension: Secondary | ICD-10-CM | POA: Diagnosis not present

## 2012-01-27 DIAGNOSIS — Z1331 Encounter for screening for depression: Secondary | ICD-10-CM | POA: Diagnosis not present

## 2012-01-27 DIAGNOSIS — R609 Edema, unspecified: Secondary | ICD-10-CM | POA: Diagnosis not present

## 2012-01-27 DIAGNOSIS — E559 Vitamin D deficiency, unspecified: Secondary | ICD-10-CM | POA: Diagnosis not present

## 2012-02-13 DIAGNOSIS — Z79899 Other long term (current) drug therapy: Secondary | ICD-10-CM | POA: Diagnosis not present

## 2012-03-12 ENCOUNTER — Ambulatory Visit
Admission: RE | Admit: 2012-03-12 | Discharge: 2012-03-12 | Disposition: A | Discharge status: Home / Self Care | Payer: Medicare Other | Source: Ambulatory Visit | Attending: Gynecology | Admitting: Gynecology

## 2012-03-12 ENCOUNTER — Other Ambulatory Visit: Payer: Self-pay | Admitting: Gynecology

## 2012-03-12 DIAGNOSIS — K5900 Constipation, unspecified: Secondary | ICD-10-CM

## 2012-03-12 DIAGNOSIS — N924 Excessive bleeding in the premenopausal period: Secondary | ICD-10-CM | POA: Diagnosis not present

## 2012-03-12 DIAGNOSIS — R140 Abdominal distension (gaseous): Secondary | ICD-10-CM

## 2012-03-12 DIAGNOSIS — R194 Change in bowel habit: Secondary | ICD-10-CM

## 2012-03-12 DIAGNOSIS — R198 Other specified symptoms and signs involving the digestive system and abdomen: Secondary | ICD-10-CM | POA: Diagnosis not present

## 2012-03-24 DIAGNOSIS — R141 Gas pain: Secondary | ICD-10-CM | POA: Diagnosis not present

## 2012-04-02 DIAGNOSIS — R143 Flatulence: Secondary | ICD-10-CM | POA: Diagnosis not present

## 2012-04-02 DIAGNOSIS — R609 Edema, unspecified: Secondary | ICD-10-CM | POA: Diagnosis not present

## 2012-04-02 DIAGNOSIS — J329 Chronic sinusitis, unspecified: Secondary | ICD-10-CM | POA: Diagnosis not present

## 2012-04-02 DIAGNOSIS — R141 Gas pain: Secondary | ICD-10-CM | POA: Diagnosis not present

## 2012-04-02 DIAGNOSIS — Z79899 Other long term (current) drug therapy: Secondary | ICD-10-CM | POA: Diagnosis not present

## 2012-04-06 ENCOUNTER — Other Ambulatory Visit: Payer: Self-pay | Admitting: Gastroenterology

## 2012-04-06 DIAGNOSIS — R1031 Right lower quadrant pain: Secondary | ICD-10-CM

## 2012-04-06 DIAGNOSIS — R141 Gas pain: Secondary | ICD-10-CM | POA: Diagnosis not present

## 2012-04-06 DIAGNOSIS — R142 Eructation: Secondary | ICD-10-CM | POA: Diagnosis not present

## 2012-04-09 ENCOUNTER — Ambulatory Visit
Admission: RE | Admit: 2012-04-09 | Discharge: 2012-04-09 | Disposition: A | Discharge status: Home / Self Care | Payer: Medicare Other | Source: Ambulatory Visit | Attending: Gastroenterology | Admitting: Gastroenterology

## 2012-04-09 DIAGNOSIS — K5730 Diverticulosis of large intestine without perforation or abscess without bleeding: Secondary | ICD-10-CM | POA: Diagnosis not present

## 2012-04-09 DIAGNOSIS — R1031 Right lower quadrant pain: Secondary | ICD-10-CM

## 2012-04-09 MED ORDER — IOHEXOL 300 MG/ML  SOLN
125.0000 mL | Freq: Once | INTRAMUSCULAR | Status: AC | PRN
  Administered 2012-04-09: 125 mL via INTRAVENOUS

## 2012-05-03 DIAGNOSIS — K5730 Diverticulosis of large intestine without perforation or abscess without bleeding: Secondary | ICD-10-CM | POA: Diagnosis not present

## 2012-05-07 DIAGNOSIS — N390 Urinary tract infection, site not specified: Secondary | ICD-10-CM | POA: Diagnosis not present

## 2012-05-07 DIAGNOSIS — R300 Dysuria: Secondary | ICD-10-CM | POA: Diagnosis not present

## 2012-05-12 ENCOUNTER — Ambulatory Visit (INDEPENDENT_AMBULATORY_CARE_PROVIDER_SITE_OTHER): Payer: Medicare Other | Admitting: Neurology

## 2012-05-12 ENCOUNTER — Encounter: Payer: Self-pay | Admitting: Neurology

## 2012-05-12 VITALS — BP 140/72 | HR 98 | Temp 98.3°F | Ht 65.0 in | Wt 226.0 lb

## 2012-05-12 DIAGNOSIS — I10 Essential (primary) hypertension: Secondary | ICD-10-CM

## 2012-05-12 DIAGNOSIS — H8309 Labyrinthitis, unspecified ear: Secondary | ICD-10-CM | POA: Insufficient documentation

## 2012-05-12 DIAGNOSIS — F079 Unspecified personality and behavioral disorder due to known physiological condition: Secondary | ICD-10-CM

## 2012-05-12 DIAGNOSIS — I619 Nontraumatic intracerebral hemorrhage, unspecified: Secondary | ICD-10-CM | POA: Diagnosis not present

## 2012-05-12 DIAGNOSIS — G40219 Localization-related (focal) (partial) symptomatic epilepsy and epileptic syndromes with complex partial seizures, intractable, without status epilepticus: Secondary | ICD-10-CM

## 2012-05-12 DIAGNOSIS — H8302 Labyrinthitis, left ear: Secondary | ICD-10-CM

## 2012-05-12 DIAGNOSIS — F09 Unspecified mental disorder due to known physiological condition: Secondary | ICD-10-CM | POA: Insufficient documentation

## 2012-05-12 MED ORDER — KEPPRA 500 MG PO TABS: ORAL_TABLET | ORAL | Status: DC

## 2012-05-12 NOTE — Patient Instructions (Addendum)
Seizure, Adult  A seizure is abnormal electrical activity in the brain. Seizures can cause a change in attention or behavior (altered mental status). Seizures often involve uncontrollable shaking (convulsions). Seizures usually last from 30 seconds to 2 minutes. Epilepsy is a brain disorder in which a patient has repeated seizures over time.  CAUSES   There are many different problems that can cause seizures. In some cases, no cause is found. Common causes of seizures include:   Head injuries.   Brain tumors.   Infections.   Imbalance of chemicals in the blood.   Kidney failure or liver failure.   Heart disease.   Drug abuse.   Stroke.   Withdrawal from certain drugs or alcohol.   Birth defects.   Malfunction of a neurosurgical device placed in the brain.  SYMPTOMS   Symptoms vary depending on the part of the brain that is involved. Right before a seizure, you may have a warning (aura) that a seizure is about to occur. An aura may include the following symptoms:    Fear or anxiety.   Nausea.   Feeling like the room is spinning (vertigo).   Vision changes, such as seeing flashing lights or spots.  Common symptoms during a seizure include:   Convulsions.   Drooling.   Rapid eye movements.   Grunting.   Loss of bladder and bowel control.   Bitter taste in the mouth.  After a seizure, you may feel confused and sleepy. You may also have an injury resulting from convulsions during the seizure.  DIAGNOSIS   Your caregiver will perform a physical exam and run some tests to determine the type and cause of your seizure. These tests may include:   Blood tests.   A lumbar puncture test. In this test, a small amount of fluid is removed from the spine and examined.   Electrocardiography (ECG). This test records the electrical activity in your heart.   Imaging tests, such as computed tomography (CT) scans or magnetic resonance imaging (MRI).   Electroencephalography (EEG). This test records the electrical  activity in your brain.  TREATMENT   Seizures usually stop on their own. Treatment will depend on the cause of your seizure. In some cases, medicine may be given to prevent future seizures.  HOME CARE INSTRUCTIONS    If you are given medicines, take them exactly as prescribed by your caregiver.   Keep all follow-up appointments as directed by your caregiver.   Do not swim or drive until your caregiver says it is okay.   Teach friends and family what to do if you have a seizure. They should:   Lay you on the ground to prevent a fall.   Put a cushion under your head.   Loosen any tight clothing around your neck.   Turn you on your side. If vomiting occurs, this helps keep your airway clear.   Stay with you until you recover.  SEEK IMMEDIATE MEDICAL CARE IF:   The seizure lasts longer than 2 to 5 minutes.   The seizure is severe or the person does not wake up after the seizure.   The person has altered mental status.  Drive the person to the emergency department or call your local emergency services (911 in [place]  MAKE SURE YOU:   Understand these instructions.   Will watch your condition.   Will get help right away if you are not doing well or get worse.  Document Released: 12/28/1999 Document Revised: 03/24/2011 Document   Reviewed: 12/18/2010  ExitCare Patient Information 2013 [employer].

## 2012-05-12 NOTE — Progress Notes (Signed)
[facility]  Provider:  Dr Vickey Huger Referring Provider: No ref. provider found Primary Care Physician:  Mickie Hillier, MD  Chief Complaint  Patient presents with  . Follow-up    yearly, rm 11    HPI:  Beth [name] is a 72 y.o. female here as a referral from Dr.Kevin little , a [facility]. The practice at [facility]. 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 She also has a history hypertension, osteopenia, vertical and some mild hearing loss. Surgical history is positive for right foot fracture May 2010 but it would have a surgery 2010 and 4 surgeries to the ankle but she fractured in a seizure related incident 2010.  Beth [name] has been seizure-free for over 12 months on her current regimen of Keppra and clonazepam. [facility] [address]  in [place]. 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 She also takes vitamin D against fatigue.  Social history : The patient is meanwhile 72 years of age she is right-handed married lives with her husband Beth [name] the couple has 2 adult children the patient is a nonsmoker non-alcohol drinker she may drink 2 cups of caffeine daily she has never used illegal drugs she had in the past very irregular sleep habits and sleep hygiene has been rubbing implemented to help her to get more restful nocturnal sleep.  Her family history in short:  Beth [name] is living at 50 that is under hospice care- she does have age-related dementia, she also suffered a stroke in the past the Beth [name] had breast cancer and seizures.   Review of Systems: Out of a complete 14  system review, the patient complains of only the following symptoms, and all other reviewed systems are negative. Routine visit for seizure medication.     History   Social History  . Marital Status: Married    Spouse Name: N/A    Number of Children: N/A  . Years of Education: N/A   Occupational History  . Not on file.   Social History Main Topics  . Smoking status: Never Smoker   . Smokeless tobacco: Not on file  . Alcohol Use: No  . Drug Use: No  . Sexually Active: Not on file   Other Topics Concern  . Not on file   Social History Narrative  . No narrative on file    Family History  Problem Relation Age of Onset  . Dementia [name]   . Stroke [name]     Past Medical History  Diagnosis Date  . 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 Stroke, hemorrhagic aneurysm bleed.   Marland Kitchen HTN (hypertension)   . Vestibulitis of ear   . Seizures   . Hearing loss   . Memory loss     Past Surgical History  Procedure Laterality Date  . Cerebral aneurysm repair      Current Outpatient Prescriptions  Medication Sig Dispense Refill  . aspirin 325 MG tablet Take 325 mg by mouth daily.      Marland Kitchen atenolol (TENORMIN) 50 MG tablet Take 50 mg by mouth 2 (two)  times daily.      . captopril (CAPOTEN) 50 MG tablet Take 50 mg by mouth 2 (two) times daily.      . Cholecalciferol (VITAMIN D PO) Take 1,000 Units by mouth. Twice daily      . clonazePAM (KLONOPIN) 2 MG tablet Take 2 mg by mouth daily. 1/2 tablet daily      . escitalopram (LEXAPRO) 20 MG tablet Take 20 mg by mouth daily. Morning dosage      . etodolac (LODINE) 400 MG tablet Take 400 mg by mouth 2 (two) times daily. With food, as needed      . fexofenadine (ALLEGRA) 180 MG tablet Take 180 mg by mouth daily. As needed      . fluticasone (FLONASE) 50 MCG/ACT nasal spray Place 2 sprays into the nose daily. Spray twice daily as needed      . glycopyrrolate (ROBINUL) 2 MG tablet Take 2  mg by mouth 2 (two) times daily. As needed      . GuaiFENesin (MUCINEX PO) Take 1,200 mg by mouth. Twice daily as needed      . levETIRAcetam (KEPPRA) 500 MG tablet Take 500 mg by mouth 2 (two) times daily. 1-1/2 twice daily      . nitrofurantoin (MACRODANTIN) 100 MG capsule Take 100 mg by mouth 4 (four) times daily. One capsule with food every 12 hours for 7 days      . simvastatin (ZOCOR) 10 MG tablet Take 10 mg by mouth at bedtime.      . triamterene-hydrochlorothiazide (DYAZIDE) 37.5-25 MG per capsule Take 1 capsule by mouth daily. Morning dosage       No current facility-administered medications for this visit.    Allergies as of 05/12/2012 - Review Complete 05/12/2012  Allergen Reaction Noted  . Prozac (fluoxetine hcl)  05/12/2012  . Sulfur  05/12/2012  . Micardis (telmisartan)  05/12/2012  . Prednisone  05/12/2012  . Augmentin (amoxicillin-pot clavulanate) Rash 05/12/2012  . Avelox (moxifloxacin hcl in nacl) Rash 05/12/2012  . Norvasc (amlodipine besylate)  05/12/2012    Vitals: BP 140/72  Pulse 98  Temp(Src) 98.3 F (36.8 C)  Ht 5\' 5"  (1.651 m)  Wt 226 lb (102.513 kg)  BMI 37.61 kg/m2 Last Weight:  Wt Readings from Last 1 Encounters:  05/12/12 226 lb (102.513 kg)   Last Height:   Ht Readings from Last 1 Encounters:  05/12/12 5\' 5"  (1.651 m)   Vision Screening:  See vitals.   Physical exam:  General: The patient is awake, alert and appears not in acute distress. The patient is well groomed. Head: Normocephalic, atraumatic. Neck is supple. Mallampati, neck circumference: Cardiovascular:  Regular rate and rhythm- without  murmurs or carotid bruit, and without distended neck veins. Respiratory: Lungs are clear to auscultation. Skin:  Without evidence of edema, or rash Trunk: BMI  At 37  Elevated, morbidly obese,  and patient  has normal posture.  Neurologic exam : The patient is awake and alert, oriented to place and time.  Memory subjective impaired.  Last MMSE  30-30 in 2013.  There is a normal attention span & concentration ability. Speech is non  fluent with mild  aphasia. Mood and affect  BOPS, mood affected. Patient reports that she has been made aware of her sometimes tangential thought process or circumferential speech. At times it seems as if her current to some degree of her feelings, and but I assume that this is a manifestation of her previous brain injury and part of  the brain organic Psychological syndrome. I added a MOCA test today : the results were evident for visual spatial limitations, and difficulties with the trail making test.  26 out of 30 points and all 5 immediate recall words were met.   Cranial nerves: Pupils are equal and briskly reactive to light. without nystagmus. Visual fields by finger perimetry are intact. Hearing to finger rub intact.  Facial sensation intact to fine touch. Facial motor strength is symmetric and tongue and uvula move midline.  Motor exam:  Symmetric normal strength of both upper extremities and lower extremities sensory intact to pinprick and vibration coordination is rapid alternating movements was normal in both upper extremities and 2013 finger-to-nose testing was intact perhaps slightly slower on the right gait and station was normal the patient is able to walk without assistance she has a wider based gait at baseline. Reflexes 1+ on the right 1 minus on the left , no upgoing Babinski response.   Assessment:  After physical and neurologic examination, review of laboratory studies, imaging, neurophysiology testing and pre-existing records, assessment will be reviewed on the problem list.  Plan:  Treatment plan and additional workup will be reviewed under Problem List.

## 2012-05-12 NOTE — Assessment & Plan Note (Signed)
Continue seizure control as he given medication.  Continued brain organic syndrome treatment with the SSRI.

## 2012-05-13 ENCOUNTER — Telehealth: Payer: Self-pay

## 2012-05-13 DIAGNOSIS — G40219 Localization-related (focal) (partial) symptomatic epilepsy and epileptic syndromes with complex partial seizures, intractable, without status epilepticus: Secondary | ICD-10-CM

## 2012-05-13 DIAGNOSIS — I619 Nontraumatic intracerebral hemorrhage, unspecified: Secondary | ICD-10-CM

## 2012-05-13 MED ORDER — KEPPRA 500 MG PO TABS: ORAL_TABLET | ORAL | Status: DC

## 2012-05-13 NOTE — Telephone Encounter (Signed)
Spouse called saying they would like rx sent to [facility] rather than local pharmacy.

## 2012-07-22 DIAGNOSIS — R7301 Impaired fasting glucose: Secondary | ICD-10-CM | POA: Diagnosis not present

## 2012-07-22 DIAGNOSIS — M545 Low back pain: Secondary | ICD-10-CM | POA: Diagnosis not present

## 2012-07-22 DIAGNOSIS — I10 Essential (primary) hypertension: Secondary | ICD-10-CM | POA: Diagnosis not present

## 2012-07-22 DIAGNOSIS — E559 Vitamin D deficiency, unspecified: Secondary | ICD-10-CM | POA: Diagnosis not present

## 2012-07-22 DIAGNOSIS — E785 Hyperlipidemia, unspecified: Secondary | ICD-10-CM | POA: Diagnosis not present

## 2012-08-18 ENCOUNTER — Other Ambulatory Visit: Payer: Self-pay

## 2012-10-21 ENCOUNTER — Encounter: Payer: Self-pay | Admitting: Gynecology

## 2012-11-18 ENCOUNTER — Other Ambulatory Visit: Payer: Self-pay

## 2012-12-06 DIAGNOSIS — J029 Acute pharyngitis, unspecified: Secondary | ICD-10-CM | POA: Diagnosis not present

## 2012-12-06 DIAGNOSIS — R300 Dysuria: Secondary | ICD-10-CM | POA: Diagnosis not present

## 2012-12-12 DIAGNOSIS — J019 Acute sinusitis, unspecified: Secondary | ICD-10-CM | POA: Diagnosis not present

## 2012-12-12 DIAGNOSIS — R319 Hematuria, unspecified: Secondary | ICD-10-CM | POA: Diagnosis not present

## 2013-03-31 ENCOUNTER — Encounter: Payer: Self-pay | Admitting: Gynecology

## 2013-03-31 ENCOUNTER — Ambulatory Visit: Payer: Self-pay | Admitting: Gynecology

## 2013-04-01 ENCOUNTER — Ambulatory Visit: Payer: Self-pay | Admitting: Gynecology

## 2013-04-05 ENCOUNTER — Ambulatory Visit (INDEPENDENT_AMBULATORY_CARE_PROVIDER_SITE_OTHER): Payer: Medicare Other | Admitting: Gynecology

## 2013-04-05 VITALS — BP 134/74 | HR 80 | Resp 20 | Ht 65.0 in | Wt 237.0 lb

## 2013-04-05 DIAGNOSIS — Z124 Encounter for screening for malignant neoplasm of cervix: Secondary | ICD-10-CM | POA: Diagnosis not present

## 2013-04-05 DIAGNOSIS — Z803 Family history of malignant neoplasm of breast: Secondary | ICD-10-CM

## 2013-04-05 DIAGNOSIS — Z01419 Encounter for gynecological examination (general) (routine) without abnormal findings: Secondary | ICD-10-CM | POA: Diagnosis not present

## 2013-04-05 NOTE — Patient Instructions (Addendum)

## 2013-04-05 NOTE — Progress Notes (Signed)
73 y.o. Married Caucasian female   G2P2002 here for annual exam. Pt reports menses are not regular.  does have vaginal dryness.  She is using lubricants, OTC.  She does not report post-menopasual bleeding.  Pt reports that she has some ctramping without bleeding, she was diagnosed with diverticulosis by GI.  No LMP recorded.          Sexually active: no  The current method of family planning is post menopausal status.    Exercising: no  The patient does not participate in regular exercise at present. Last pap: 2007  Abnormal PAP: no Mammogram: 06/19/10 Bi-Rads 1  BSE: fairly monthly Colonoscopy: 11/11 Polyp f/u in 5 years ago DEXA: none Alcohol: no Tobacco: no  Labs: Hulan Fess , MD     Health Maintenance  Topic Date Due  . Tetanus/tdap  08/19/1960  . Mammogram  08/20/1991  . Colonoscopy  08/20/1991  . Zostavax  08/19/2001  . Pneumococcal Polysaccharide Vaccine Age 63 And Over  08/20/2006  . Influenza Vaccine  08/13/2012    Family History  Problem Relation Age of Onset  . Dementia Mother   . Stroke Mother   . Ovarian cancer Paternal Grandmother   . Thyroid disease Mother   . Osteoporosis Mother     Patient Active Problem List   Diagnosis Date Noted  . 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 Seizure disorder, complex partial, with intractable epilepsy   . Stroke, hemorrhagic   . HTN (hypertension)   . Vestibulitis of ear     Past Medical History  Diagnosis Date  . 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 Stroke, hemorrhagic aneurysm bleed.   Marland Kitchen HTN (hypertension)   . Vestibulitis of ear   . Seizures   . Hearing loss   . Memory loss     Past Surgical History  Procedure Laterality Date  . Cerebral aneurysm repair    . Tonsillectomy      Allergies: Prozac; Sulfur; Micardis; Prednisone; Augmentin; Avelox; and Norvasc  Current Outpatient Prescriptions  Medication Sig Dispense Refill  . aspirin 325 MG tablet  Take 325 mg by mouth daily.      Marland Kitchen atenolol (TENORMIN) 50 MG tablet Take 50 mg by mouth 2 (two) times daily.      . captopril (CAPOTEN) 50 MG tablet Take 50 mg by mouth 2 (two) times daily.      . Cholecalciferol (VITAMIN D PO) Take 1,000 Units by mouth. Twice daily      . clonazePAM (KLONOPIN) 2 MG tablet Take 2 mg by mouth daily. 1/2 tablet daily      . escitalopram (LEXAPRO) 20 MG tablet Take 20 mg by mouth daily. Morning dosage      . fexofenadine (ALLEGRA) 180 MG tablet Take 180 mg by mouth daily. As needed      . fluticasone (FLONASE) 50 MCG/ACT nasal spray Place 2 sprays into the nose daily. Spray twice daily as needed      . glycopyrrolate (ROBINUL) 2 MG tablet Take 2 mg by mouth 2 (two) times daily. As needed      . GuaiFENesin (MUCINEX PO) Take 1,200 mg by mouth. Twice daily as needed      . KEPPRA 500 MG tablet 1.5 tablets  twice daily by mouth - 750 mg bid po.  270 tablet  3  . simvastatin (ZOCOR) 10 MG tablet Take 10 mg by mouth at bedtime.      . triamterene-hydrochlorothiazide (DYAZIDE)  37.5-25 MG per capsule Take 1 capsule by mouth daily. Morning dosage      . etodolac (LODINE) 400 MG tablet Take 400 mg by mouth 2 (two) times daily. With food, as needed       No current facility-administered medications for this visit.    ROS: Pertinent items are noted in HPI.  Exam:    BP 134/74  Pulse 80  Resp 20  Ht 5\' 5"  (1.651 m)  Wt 237 lb (107.502 kg)  BMI 39.44 kg/m2 Weight change: @WEIGHTCHANGE @ Last 3 height recordings:  Ht Readings from Last 3 Encounters:  04/05/13 5\' 5"  (1.651 m)  05/12/12 5\' 5"  (1.651 m)   General appearance: alert, cooperative and appears stated age Head: Normocephalic, without obvious abnormality, atraumatic Neck: no adenopathy, no carotid bruit, no JVD, supple, symmetrical, trachea midline and thyroid not enlarged, symmetric, no tenderness/mass/nodules Lungs: clear to auscultation bilaterally Breasts: normal appearance, no masses or  tenderness Heart: regular rate and rhythm, S1, S2 normal, no murmur, click, rub or gallop Abdomen: soft, non-tender; bowel sounds normal; no masses,  no organomegaly Extremities: extremities normal, atraumatic, no cyanosis or edema Skin: Skin color, texture, turgor normal. No rashes or lesions Lymph nodes: Cervical, supraclavicular, and axillary nodes normal. no inguinal nodes palpated Neurologic: Grossly normal   Pelvic: External genitalia:  atrophic appearance              Urethra: normal appearing urethra with no masses, tenderness or lesions              Bartholins and Skenes: normal                 Vagina: atrophic              Cervix: normal appearance              Pap taken: no        Bimanual Exam:  Uterus:  uterus is normal size, shape, consistency and nontender, limited by habitus                                      Adnexa:    no masses                                      Rectovaginal: Confirms                                      Anus:  normal sphincter tone, no lesions  A: well woman Postmenopausal female     P: mammogram counseled on breast self exam, mammography screening, menopause, osteoporosis, adequate intake of calcium and vitamin D, diet and exercise return annually or prn Discussed PAP guideline changes, importance of weight bearing exercises, calcium, vit D and balanced diet.  An After Visit Summary was printed and given to the patient.

## 2013-04-11 ENCOUNTER — Other Ambulatory Visit: Payer: Self-pay | Admitting: Neurology

## 2013-04-21 ENCOUNTER — Ambulatory Visit (INDEPENDENT_AMBULATORY_CARE_PROVIDER_SITE_OTHER): Payer: Medicare Other | Admitting: Neurology

## 2013-04-21 ENCOUNTER — Encounter: Payer: Self-pay | Admitting: Neurology

## 2013-04-21 ENCOUNTER — Telehealth: Payer: Self-pay | Admitting: *Deleted

## 2013-04-21 ENCOUNTER — Other Ambulatory Visit: Payer: Self-pay | Admitting: Neurology

## 2013-04-21 VITALS — BP 126/72 | HR 60 | Resp 16 | Ht 64.5 in | Wt 238.0 lb

## 2013-04-21 DIAGNOSIS — G40802 Other epilepsy, not intractable, without status epilepticus: Secondary | ICD-10-CM

## 2013-04-21 DIAGNOSIS — F079 Unspecified personality and behavioral disorder due to known physiological condition: Secondary | ICD-10-CM | POA: Diagnosis not present

## 2013-04-21 DIAGNOSIS — F09 Unspecified mental disorder due to known physiological condition: Secondary | ICD-10-CM

## 2013-04-21 DIAGNOSIS — I609 Nontraumatic subarachnoid hemorrhage, unspecified: Secondary | ICD-10-CM

## 2013-04-21 MED ORDER — CLONAZEPAM 2 MG PO TABS: 2.0000 mg | ORAL_TABLET | Freq: Every day | ORAL | Status: DC

## 2013-04-21 MED ORDER — ESCITALOPRAM OXALATE 20 MG PO TABS: 20.0000 mg | ORAL_TABLET | Freq: Every day | ORAL | Status: DC

## 2013-04-21 MED ORDER — LEXAPRO 20 MG PO TABS: 20.0000 mg | ORAL_TABLET | Freq: Every day | ORAL | Status: AC

## 2013-04-21 MED ORDER — LEXAPRO 20 MG PO TABS: 20.0000 mg | ORAL_TABLET | Freq: Every day | ORAL | Status: DC

## 2013-04-21 MED ORDER — KEPPRA 500 MG PO TABS: 500.0000 mg | ORAL_TABLET | Freq: Two times a day (BID) | ORAL | Status: DC

## 2013-04-21 NOTE — Patient Instructions (Signed)
Seizure, Adult A seizure means there is unusual activity in the brain. A seizure can cause changes in attention or behavior. Seizures often cause shaking (convulsions). Seizures often last from 30 seconds to 2 minutes. HOME CARE   If you are given medicines, take them exactly as told by your doctor.  Keep all doctor visits as told.  Do not swim or drive until your doctor says it is okay.  Teach others what to do if you have a seizure. They should:  Lay you on the ground.  Put a cushion under your head.  Loosen any tight clothing around your neck.  Turn you on your side.  Stay with you until you get better. GET HELP RIGHT AWAY IF:   The seizure lasts longer than 2 to 5 minutes.  The seizure is very bad.  The person does not wake up after the seizure.  The person's attention or behavior changes. Drive the person to the emergency room or call your local emergency services (911 in [place] MAKE SURE YOU:   Understand these instructions.  Will watch your condition.  Will get help right away if you are not doing well or get worse. Document Released: 06/18/2007 Document Revised: 03/24/2011 Document Reviewed: 12/18/2010 Exit[employer] Patient Information 2014 [employer].  

## 2013-04-21 NOTE — Telephone Encounter (Signed)
Patient is requesting the Brand of Lexapro and not the generic, states the problems that she has been having is because she was switched to the generic.  Patient wants the Klonopin sent to [facility] for 90 days and not a 30 day for the local pharmacy, she has enough medication to last until the mail order rx comes in.  Patient's husband brought the 30 day rx of Klonopin back up to the office to be destroyed.

## 2013-04-21 NOTE — Telephone Encounter (Signed)
Patient's husband calling about the Lexapro prescription being sent to [facility] which is okay; however, they need a 2-week supply of it to get them by until it arrives. Locally they use [facility] and it has to be brand name only.

## 2013-04-21 NOTE — Telephone Encounter (Signed)
Filled klonipin for 90 days and added brand name request under Lexapro prescription.

## 2013-04-21 NOTE — Addendum Note (Signed)
Addended by: Larey Seat on: 04/21/2013 05:06 PM   Modules accepted: Orders

## 2013-04-21 NOTE — Progress Notes (Signed)
[facility]  Provider:  Dr Brett Fairy Referring Provider: Hulan Fess, MD Primary Care Physician:  Gennette Pac, MD  Patient with [facility] after an aneurysm bleed, seizures related to [facility].   HPI:  Beth [name] is a 73 y.o. 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  She also has a history hypertension, osteopenia, vertical and some mild hearing loss, classified as vestibulitis in her older records.  Surgical history is positive for right foot fracture May 2010 but it would have a surgery 2010 and 4 surgeries to the ankle. Her ankle was fractured in a seizure related incident 2010, twisting her foot in the midst of her typical left twisting body movements during a seizure.  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  She is taking 20 mg daily. She also takes vitamin D against fatigue. Her affect is better controlled and she feels more assured, and now is less socially isolated.  Beth [name] had been seizure-free for over 12 months by the time of her April 2014 visit, now she reports that she had one seizure around Christmas 2014, while compliant on her current regimen of Keppra and Clonazepam.  She fills her medications at [facility] on [address]  in [place].  Social history : The patient is 21 years of [name] she is right-handed , married lives with her husband Beth [name], the couple has 2 adult children.  The patient is a nonsmoker non-alcohol drinker , she may drink 2 cups of caffeine daily  And she has never used illegal drugs . She lost her mother in November 2014  (Thanksgiving) at [name] 29, while in hospice in [place]. Beth [name]  was sleep deprived from visiting again and again.  She had in the past very irregular sleep habits and sleep hygiene has been discussed, but failed to be implemented to help her to get more restful nocturnal sleep. Her family history in short:  patient's mother had [name]-related dementia, she also suffered a stroke in the past,  the patient's daughter had breast cancer and seizures.    Review of Systems: Out of a complete 14 system review, the patient complains of only the following symptoms, and all other reviewed systems are negative. Routine visit for seizure medication.     History   Social History  . Marital Status: Married    Spouse Name: Beth [name]    Number of Children: 2  . Years of Education: 12   Occupational History  . Not on file.   Social History Main Topics  . Smoking status: Former Research scientist (life sciences)  . Smokeless tobacco: Never Used  . Alcohol Use: No  . Drug Use: No  . Sexual Activity: Not on file   Other Topics Concern  . Not on file   Social History Narrative   Patient is married (Beth [name]) and lives at home with her husband.   Patient is retired.   Patient has two children.   Patient has a high school education.   Patient is right-handed.   Patient drinks 2 big mugs of coffee daily and sometimes tea in the evenings.    Family History  Problem Relation [name] of Onset  .  Dementia Mother   . Stroke Mother   . Ovarian cancer Paternal Grandmother   . Thyroid disease Mother   . Osteoporosis Mother     Past Medical History  Diagnosis Date  . 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 Stroke, hemorrhagic aneurysm bleed.   Marland Kitchen HTN (hypertension)   . Vestibulitis of ear   . Seizures   . Hearing loss   . Memory loss     Past Surgical History  Procedure Laterality Date  . Cerebral aneurysm repair    . Tonsillectomy      Current Outpatient Prescriptions  Medication Sig Dispense Refill  . aspirin 325 MG tablet Take 325 mg  by mouth daily.      Marland Kitchen atenolol (TENORMIN) 50 MG tablet Take 50 mg by mouth 2 (two) times daily.      . captopril (CAPOTEN) 50 MG tablet Take 50 mg by mouth 2 (two) times daily.      . Cholecalciferol (VITAMIN D PO) Take 1,000 Units by mouth. Twice daily      . clonazePAM (KLONOPIN) 2 MG tablet Take 2 mg by mouth daily. 1/2 tablet daily      . escitalopram (LEXAPRO) 20 MG tablet Take 20 mg by mouth daily. Morning dosage      . etodolac (LODINE) 400 MG tablet Take 400 mg by mouth 2 (two) times daily. With food, as needed      . fexofenadine (ALLEGRA) 180 MG tablet Take 180 mg by mouth daily. As needed      . fluticasone (FLONASE) 50 MCG/ACT nasal spray Place 2 sprays into the nose daily. Spray twice daily as needed      . glycopyrrolate (ROBINUL) 2 MG tablet Take 2 mg by mouth 2 (two) times daily. As needed      . GuaiFENesin (MUCINEX PO) Take 1,200 mg by mouth. Twice daily as needed      . KEPPRA 500 MG tablet TAKE ONE AND ONE-HALF TABLETS TWICE DAILY  270 tablet  0  . simvastatin (ZOCOR) 10 MG tablet Take 10 mg by mouth at bedtime.      . triamterene-hydrochlorothiazide (DYAZIDE) 37.5-25 MG per capsule Take 1 capsule by mouth daily. Morning dosage       No current facility-administered medications for this visit.    Allergies as of 04/21/2013 - Review Complete 04/21/2013  Allergen Reaction Noted  . Prozac [fluoxetine hcl]  05/12/2012  . Sulfur  05/12/2012  . Levaquin [levofloxacin in d5w]  04/05/2013  . Micardis [telmisartan]  05/12/2012  . Prednisone  05/12/2012  . Augmentin [amoxicillin-pot clavulanate] Rash 05/12/2012  . Avelox [moxifloxacin hcl in nacl] Rash 05/12/2012  . Norvasc [amlodipine besylate]  05/12/2012    Vitals: BP 126/72  Pulse 60  Resp 16  Ht 5' 4.5" (1.638 m)  Wt 238 lb (107.956 kg)  BMI 40.24 kg/m2 Last Weight:  Wt Readings from Last 1 Encounters:  04/21/13 238 lb (107.956 kg)   Last Height:   Ht Readings from Last 1 Encounters:  04/21/13 5' 4.5" (1.638  m)   Vision Screening:  See vitals.   Physical exam:  General: The patient is awake, alert and appears not in acute distress. The patient is well groomed.  Head: Normocephalic, atraumatic. Neck is supple. Mallampati,  Neck circumference   ;Cardiovascular:  Regular rate and rhythm- without  murmurs or carotid bruit, and without distended neck veins. Respiratory: Lungs are clear to auscultation. Skin:  Without evidence of  edema, or rash Trunk: BMI  At 37  Elevated, morbidly obese,  and patient  has normal posture.  Neurologic exam : The patient is awake and alert, oriented to place and time.  Memory subjective impaired.  Last MMSE 30-30 in 2013.  There is a normal attention span & concentration ability. Speech is non  fluent with mild  aphasia. Mood and affect  BOPS, mood affected. Patient reports that she has been made aware of her sometimes tangential thought process or circumferential speech. At times it seems as if her current to some degree of her feelings, and but I assume that this is a manifestation of her previous brain injury and part of the brain organic Psychological syndrome. I added a MOCA test today : the results were evident for visual spatial limitations, and difficulties with the trail making test  26 out of 30 points and all 5 immediate recall words were met.   Cranial nerves: Pupils are equal and briskly reactive to light. With a stepwise, non - smooth horizontal persuit in either direction. Visual fields by finger perimetry are intact. Hearing to finger rub intact.   Facial sensation intact to fine touch. Facial motor strength is symmetric and tongue and uvula move midline.  Motor exam:   normal sensory of both upper extremities and lower extremities sensory intact to pinprick and vibration . Coordination is intact in finger to nose  Maneuver and rapid alternating movements . Equal grip strength .  Both upper extremities have normal metria and taxus . finger-to-nose testing  was intact perhaps slightly slower on the right   The patient is able to walk without assistance , she rises ands braces herself . she has a wider based gait at baseline, walks careful.   There is brisker DTR response on the left side, indicative of mild spasticity.  No upgoing Babinski response.      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  Her memory is fairly unimpaired but executive function and smooth eye movements are affected.  Seizures resulted from the lesion/ gliosis. Lifelong medication control is necessary. Driving was not permitted due to yearly  seizure activity.      Plan:  Treatment plan and additional workup will be reviewed under Problem List.  refilled keppra and klonopin, i advised in sleep hygiene. Patient uses TV at night with a timer.

## 2013-04-21 NOTE — Telephone Encounter (Signed)
Patient also requesting 90 day Rx for Klonopin be sent to mail order rather than local pharmacy.  They returned the other Rx that was written for 30 days so it could be shredded.

## 2013-04-22 NOTE — Telephone Encounter (Signed)
Rx has been faxed.

## 2013-04-26 ENCOUNTER — Telehealth: Payer: Self-pay

## 2013-04-26 NOTE — Telephone Encounter (Signed)
Rx has been updated,  Pharmacy notified.

## 2013-04-26 NOTE — Telephone Encounter (Signed)
[facility] sent a fax asking for clarification on the Keppra Rx.  The patient previously was taking 500mg  one and one half twice daily and new Rx was for one twice daily.  They want to verify the dose change.  I will be happy to call them back, just want to ensure I provide the correct info.  Please advise.  Thank you.

## 2013-04-26 NOTE — Telephone Encounter (Signed)
It is still  1.5  tabs daily or 750 mg bid. KEPPRA

## 2013-04-28 DIAGNOSIS — M25569 Pain in unspecified knee: Secondary | ICD-10-CM | POA: Diagnosis not present

## 2013-04-28 DIAGNOSIS — M7020 Olecranon bursitis, unspecified elbow: Secondary | ICD-10-CM | POA: Diagnosis not present

## 2013-06-09 DIAGNOSIS — M25569 Pain in unspecified knee: Secondary | ICD-10-CM | POA: Diagnosis not present

## 2013-06-24 DIAGNOSIS — M25569 Pain in unspecified knee: Secondary | ICD-10-CM | POA: Diagnosis not present

## 2013-06-27 DIAGNOSIS — M25569 Pain in unspecified knee: Secondary | ICD-10-CM | POA: Diagnosis not present

## 2013-06-30 DIAGNOSIS — M25569 Pain in unspecified knee: Secondary | ICD-10-CM | POA: Diagnosis not present

## 2013-07-05 DIAGNOSIS — M25569 Pain in unspecified knee: Secondary | ICD-10-CM | POA: Diagnosis not present

## 2013-07-11 DIAGNOSIS — M25569 Pain in unspecified knee: Secondary | ICD-10-CM | POA: Diagnosis not present

## 2013-07-14 DIAGNOSIS — M25569 Pain in unspecified knee: Secondary | ICD-10-CM | POA: Diagnosis not present

## 2013-07-18 DIAGNOSIS — M25569 Pain in unspecified knee: Secondary | ICD-10-CM | POA: Diagnosis not present

## 2013-07-21 DIAGNOSIS — M25569 Pain in unspecified knee: Secondary | ICD-10-CM | POA: Diagnosis not present

## 2013-07-22 DIAGNOSIS — F329 Major depressive disorder, single episode, unspecified: Secondary | ICD-10-CM | POA: Diagnosis not present

## 2013-07-22 DIAGNOSIS — E669 Obesity, unspecified: Secondary | ICD-10-CM | POA: Diagnosis not present

## 2013-07-22 DIAGNOSIS — M2550 Pain in unspecified joint: Secondary | ICD-10-CM | POA: Diagnosis not present

## 2013-07-22 DIAGNOSIS — E785 Hyperlipidemia, unspecified: Secondary | ICD-10-CM | POA: Diagnosis not present

## 2013-07-22 DIAGNOSIS — R569 Unspecified convulsions: Secondary | ICD-10-CM | POA: Diagnosis not present

## 2013-07-22 DIAGNOSIS — I10 Essential (primary) hypertension: Secondary | ICD-10-CM | POA: Diagnosis not present

## 2013-07-22 DIAGNOSIS — R7309 Other abnormal glucose: Secondary | ICD-10-CM | POA: Diagnosis not present

## 2013-07-25 DIAGNOSIS — M25569 Pain in unspecified knee: Secondary | ICD-10-CM | POA: Diagnosis not present

## 2013-07-28 DIAGNOSIS — M25569 Pain in unspecified knee: Secondary | ICD-10-CM | POA: Diagnosis not present

## 2013-08-01 DIAGNOSIS — M25569 Pain in unspecified knee: Secondary | ICD-10-CM | POA: Diagnosis not present

## 2013-08-04 DIAGNOSIS — M25569 Pain in unspecified knee: Secondary | ICD-10-CM | POA: Diagnosis not present

## 2013-08-11 DIAGNOSIS — M25569 Pain in unspecified knee: Secondary | ICD-10-CM | POA: Diagnosis not present

## 2013-08-15 DIAGNOSIS — M25569 Pain in unspecified knee: Secondary | ICD-10-CM | POA: Diagnosis not present

## 2013-08-18 DIAGNOSIS — M25569 Pain in unspecified knee: Secondary | ICD-10-CM | POA: Diagnosis not present

## 2013-08-22 DIAGNOSIS — E669 Obesity, unspecified: Secondary | ICD-10-CM | POA: Diagnosis not present

## 2013-08-22 DIAGNOSIS — F329 Major depressive disorder, single episode, unspecified: Secondary | ICD-10-CM | POA: Diagnosis not present

## 2013-08-25 DIAGNOSIS — M25569 Pain in unspecified knee: Secondary | ICD-10-CM | POA: Diagnosis not present

## 2013-08-29 DIAGNOSIS — M25569 Pain in unspecified knee: Secondary | ICD-10-CM | POA: Diagnosis not present

## 2013-09-05 DIAGNOSIS — M25569 Pain in unspecified knee: Secondary | ICD-10-CM | POA: Diagnosis not present

## 2013-09-13 ENCOUNTER — Encounter: Payer: Self-pay | Admitting: Dietician

## 2013-09-13 ENCOUNTER — Encounter: Payer: Medicare Other | Attending: Family Medicine | Admitting: Dietician

## 2013-09-13 VITALS — Ht 65.0 in | Wt 227.6 lb

## 2013-09-13 DIAGNOSIS — Z6837 Body mass index (BMI) 37.0-37.9, adult: Secondary | ICD-10-CM | POA: Insufficient documentation

## 2013-09-13 DIAGNOSIS — E669 Obesity, unspecified: Secondary | ICD-10-CM | POA: Diagnosis not present

## 2013-09-13 DIAGNOSIS — Z713 Dietary counseling and surveillance: Secondary | ICD-10-CM | POA: Insufficient documentation

## 2013-09-13 DIAGNOSIS — R7303 Prediabetes: Secondary | ICD-10-CM

## 2013-09-13 DIAGNOSIS — R7309 Other abnormal glucose: Secondary | ICD-10-CM | POA: Insufficient documentation

## 2013-09-13 NOTE — Patient Instructions (Addendum)
-  When you have GI upset, write down what you ate  -Look into water aerobics at the [facility]  -Try G2 Gatorade or Powerade Zero  -Drink mainly water, especially when you're thirsty   -Subtract fiber from total carbohydrates -High fiber, low sugar is best!

## 2013-09-13 NOTE — Progress Notes (Signed)
  Medical Nutrition Therapy:  Appt start time: 6378 end time:  1630.   Assessment:  Primary concerns today: Beth [name] is here today with her husband. Per her MD she is "borderline diabetic." She reports that she wants to learn more about healthy eating. She also has diverticulitis. She has intestinal upset (cramping, bloating, diarrhea, constipation) after eating certain foods, specifically spinach salad with walnuts. She feels that this is resolved with chewing more thoroughly. She and her husband eat out very frequently for convenience and for social reasons. They like to go to [place], [place], [place], [place] [employer], [place], and [employer]. They mostly eat out for dinner and have leftovers for lunch. At home Beth [name] may have oatmeal, boiled egg, stewed apples and 1/2 biscuit. Recently has been trying to get more protein and fiber. Sleeps late, 10-11am.  Has breakfast for lunch and then dinner out and a carb (piece of bread with apple butter or small ice cream sandwich) before bed.  Preferred Learning Style:   No preference indicated   Learning Readiness:   Ready  MEDICATIONS: see list   DIETARY INTAKE:  Usual eating pattern includes 2 meals and 1 snacks per day.  Usual physical activity: water aerobics  Estimated energy needs: 1500-1600 calories 170-180 g carbohydrates (Discussed ~30 grams per meal and ~15 grams per snack)  Progress Towards Goal(s):  In progress.   Nutritional Diagnosis:  [place]-2.2 Altered nutrition-related laboratory As related to obesity and excessive carbohydrate intake.  As evidenced by HgbA1c 6.4%..    Intervention:  Nutrition counseling provided. Discussed carbohydrate counting. Encouraged regular exercise. Explained effects of carbohydrates, fat, and protein foods on blood sugars. Recommended an extra snack between lunch and supper as well as a bedtime snack. Goals: -When you have GI upset, write down what you ate  -Look into water  aerobics at the [facility]  -Try G2 Gatorade or Powerade Zero  -Drink mainly water, especially when you're thirsty   -Subtract fiber from total carbohydrates -High fiber, low sugar is best!  Teaching Method Utilized:  Visual Auditory Hands on  Handouts given during visit include:  15g CHO + protein snacks  MyPlate  Living Well with Diabetes Booklet  Meal planning/CHO counting card  Barriers to learning/adherence to lifestyle change: none  Demonstrated degree of understanding via:  Teach Back   Monitoring/Evaluation:  Dietary intake, exercise, HgbA1c, and body weight prn.

## 2013-10-28 ENCOUNTER — Other Ambulatory Visit: Payer: Self-pay

## 2013-11-14 ENCOUNTER — Encounter: Payer: Self-pay | Admitting: Dietician

## 2013-11-22 DIAGNOSIS — Z0000 Encounter for general adult medical examination without abnormal findings: Secondary | ICD-10-CM | POA: Diagnosis not present

## 2013-11-22 DIAGNOSIS — Z23 Encounter for immunization: Secondary | ICD-10-CM | POA: Diagnosis not present

## 2013-11-22 DIAGNOSIS — E6609 Other obesity due to excess calories: Secondary | ICD-10-CM | POA: Diagnosis not present

## 2013-11-22 DIAGNOSIS — I10 Essential (primary) hypertension: Secondary | ICD-10-CM | POA: Diagnosis not present

## 2013-11-22 DIAGNOSIS — E784 Other hyperlipidemia: Secondary | ICD-10-CM | POA: Diagnosis not present

## 2013-11-22 DIAGNOSIS — R7301 Impaired fasting glucose: Secondary | ICD-10-CM | POA: Diagnosis not present

## 2013-11-22 DIAGNOSIS — E559 Vitamin D deficiency, unspecified: Secondary | ICD-10-CM | POA: Diagnosis not present

## 2013-11-22 DIAGNOSIS — F329 Major depressive disorder, single episode, unspecified: Secondary | ICD-10-CM | POA: Diagnosis not present

## 2013-11-22 DIAGNOSIS — R569 Unspecified convulsions: Secondary | ICD-10-CM | POA: Diagnosis not present

## 2013-11-22 DIAGNOSIS — Z6838 Body mass index (BMI) 38.0-38.9, adult: Secondary | ICD-10-CM | POA: Diagnosis not present

## 2013-11-29 ENCOUNTER — Encounter: Payer: Self-pay | Admitting: Neurology

## 2013-12-13 ENCOUNTER — Telehealth: Payer: Self-pay

## 2013-12-13 NOTE — Telephone Encounter (Signed)
Pt states she wants to canceled AEX and will start using her PCP due to [facility]

## 2014-01-16 DIAGNOSIS — Z780 Asymptomatic menopausal state: Secondary | ICD-10-CM | POA: Diagnosis not present

## 2014-01-16 DIAGNOSIS — Z8262 Family history of osteoporosis: Secondary | ICD-10-CM | POA: Diagnosis not present

## 2014-01-16 DIAGNOSIS — Z803 Family history of malignant neoplasm of breast: Secondary | ICD-10-CM | POA: Diagnosis not present

## 2014-01-16 DIAGNOSIS — Z1231 Encounter for screening mammogram for malignant neoplasm of breast: Secondary | ICD-10-CM | POA: Diagnosis not present

## 2014-03-21 ENCOUNTER — Telehealth: Payer: Self-pay | Admitting: *Deleted

## 2014-04-06 ENCOUNTER — Ambulatory Visit (INDEPENDENT_AMBULATORY_CARE_PROVIDER_SITE_OTHER): Payer: Medicare Other | Admitting: Neurology

## 2014-04-06 ENCOUNTER — Encounter: Payer: Self-pay | Admitting: Neurology

## 2014-04-06 VITALS — BP 128/90 | Resp 20 | Ht 64.0 in | Wt 226.0 lb

## 2014-04-06 DIAGNOSIS — H8111 Benign paroxysmal vertigo, right ear: Secondary | ICD-10-CM

## 2014-04-06 DIAGNOSIS — H9311 Tinnitus, right ear: Secondary | ICD-10-CM | POA: Diagnosis not present

## 2014-04-06 DIAGNOSIS — H8110 Benign paroxysmal vertigo, unspecified ear: Secondary | ICD-10-CM | POA: Insufficient documentation

## 2014-04-06 DIAGNOSIS — F09 Unspecified mental disorder due to known physiological condition: Secondary | ICD-10-CM | POA: Insufficient documentation

## 2014-04-06 DIAGNOSIS — I609 Nontraumatic subarachnoid hemorrhage, unspecified: Secondary | ICD-10-CM

## 2014-04-06 DIAGNOSIS — G40409 Other generalized epilepsy and epileptic syndromes, not intractable, without status epilepticus: Secondary | ICD-10-CM | POA: Insufficient documentation

## 2014-04-06 DIAGNOSIS — H93A1 Pulsatile tinnitus, right ear: Secondary | ICD-10-CM

## 2014-04-06 DIAGNOSIS — G4089 Other seizures: Secondary | ICD-10-CM

## 2014-04-06 MED ORDER — CLONAZEPAM 2 MG PO TABS: 2.0000 mg | ORAL_TABLET | Freq: Every day | ORAL | Status: DC

## 2014-04-06 MED ORDER — LEVETIRACETAM 750 MG PO TABS: ORAL_TABLET | ORAL | Status: DC

## 2014-04-06 NOTE — Progress Notes (Signed)
[facility]  Provider:  Dr Brett Fairy Referring Provider: Hulan Fess, MD Primary Care Physician:  Tawanna Solo, MD  Patient with [facility] after an aneurysm bleed, seizures related to [facility].   HPI:  Beth [name] is a 74 y.o. 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  She also has a history hypertension, osteopenia, vertical and some mild hearing loss, classified as vestibulitis in her older records.  Surgical history is positive for right foot fracture May 2010 but it would have a surgery 2010 and 4 surgeries to the ankle. Her ankle was fractured in a seizure related incident 2010, twisting her foot in the midst of her typical left twisting body movements during a seizure.  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  She is taking 20 mg daily. She also takes vitamin D against fatigue. Her affect is better controlled and she feels more assured, and now is less socially isolated.  Beth [name] had been seizure-free for over 12 months by the time of her April 2014 visit, now she reports that she had one seizure around Christmas 2014, while compliant on her current regimen of Keppra and Clonazepam.  She fills her medications at [facility] on [address]  in [place].  Social history : The patient is 74 years of age she is right-handed , married lives with her husband Beth [name], the couple has 2 adult children.  The patient is a nonsmoker non-alcohol drinker , she may drink 2 cups of caffeine daily  And she has never used illegal drugs . She lost her mother in November 2014  (Thanksgiving) at age 83, while in hospice in [place]. Beth [name]  was sleep deprived from visiting again and again.  She had in the past very irregular sleep habits and sleep hygiene has been discussed, but failed to be implemented to help her to get more restful nocturnal sleep. Her family history in short:  patient's mother had age-related dementia, she also suffered a stroke in the past,  the patient's daughter had breast cancer and seizures.    Review of Systems: Out of a complete 14 system review, the patient complains of only the following symptoms, and all other reviewed systems are negative. Routine visit for seizure medication.     History   Social History  . Marital Status: Married    Spouse Name: Beth [name]  . Number of Children: 2  . Years of Education: 12   Occupational History  . Not on file.   Social History Main Topics  . Smoking status: Former Research scientist (life sciences)  . Smokeless tobacco: Never Used  . Alcohol Use: No  . Drug Use: No  . Sexual Activity: Not on file   Other Topics Concern  . Not on file   Social History Narrative   Patient is married ([name]) and lives at home with her husband.   Patient is retired.   Patient has two children.   Patient has a high school education.   Patient is right-handed.   Patient drinks 2 big mugs of coffee daily and sometimes tea in the evenings.    Family History  Problem Relation Age of Onset  .  Dementia Mother   . Stroke Mother   . Ovarian cancer Paternal Grandmother   . Thyroid disease Mother   . Osteoporosis Mother     Past Medical History  Diagnosis Date  . 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 Stroke, hemorrhagic aneurysm bleed.   Marland Kitchen HTN (hypertension)   . Vestibulitis of ear   . Seizures   . Hearing loss   . Memory loss     Past Surgical History  Procedure Laterality Date  . Cerebral aneurysm repair    . Tonsillectomy      Current Outpatient Prescriptions  Medication Sig Dispense Refill  . aspirin 325 MG tablet Take 325 mg  by mouth daily.    Marland Kitchen atenolol (TENORMIN) 50 MG tablet Take 50 mg by mouth 2 (two) times daily.    . captopril (CAPOTEN) 50 MG tablet Take 50 mg by mouth 2 (two) times daily.    . Cholecalciferol (VITAMIN D PO) Take 2,000 Units by mouth. Twice daily    . clonazePAM (KLONOPIN) 2 MG tablet Take 1 tablet (2 mg total) by mouth at bedtime. 1/2 tablet daily 90 tablet 1  . etodolac (LODINE) 400 MG tablet Take 400 mg by mouth 2 (two) times daily. With food, as needed    . fexofenadine (ALLEGRA) 180 MG tablet Take 180 mg by mouth daily. As needed    . fluticasone (FLONASE) 50 MCG/ACT nasal spray Place 2 sprays into the nose daily. Spray twice daily as needed    . GuaiFENesin (MUCINEX PO) Take 1,200 mg by mouth. Twice daily as needed    . levETIRAcetam (KEPPRA) 500 MG tablet Take 750 mg by mouth 2 (two) times daily.    Marland Kitchen LEXAPRO 20 MG tablet Take 1 tablet (20 mg total) by mouth daily. Morning dosage (Patient taking differently: Take 30 mg by mouth daily. Morning dosage) 90 tablet 3  . meloxicam (MOBIC) 15 MG tablet Take 15 mg by mouth daily.    . simvastatin (ZOCOR) 10 MG tablet Take 10 mg by mouth at bedtime.    . triamterene-hydrochlorothiazide (DYAZIDE) 37.5-25 MG per capsule Take 1 capsule by mouth daily. Morning dosage     No current facility-administered medications for this visit.    Allergies as of 04/06/2014 - Review Complete 04/06/2014  Allergen Reaction Noted  . Prozac [fluoxetine hcl]  05/12/2012  . Sulfur  05/12/2012  . Levaquin [levofloxacin in d5w]  04/05/2013  . Micardis [telmisartan]  05/12/2012  . Prednisone  05/12/2012  . Augmentin [amoxicillin-pot clavulanate] Rash 05/12/2012  . Avelox [moxifloxacin hcl in nacl] Rash 05/12/2012  . Norvasc [amlodipine besylate]  05/12/2012    Vitals: BP 128/90 mmHg  Resp 20  Ht '5\' 4"'  (1.626 m)  Wt 226 lb (102.513 kg)  BMI 38.77 kg/m2 Last Weight:  Wt Readings from Last 1 Encounters:  04/06/14 226 lb (102.513 kg)   Last Height:   Ht  Readings from Last 1 Encounters:  04/06/14 '5\' 4"'  (1.626 m)   Vision Screening:  See vitals.   Physical exam:  General: The patient is awake, alert and appears not in acute distress. The patient is well groomed.  Head: Normocephalic, atraumatic. Neck is supple. Mallampati,  Neck circumference   ;Cardiovascular:  Regular rate and rhythm- without  murmurs or carotid bruit, and without distended neck veins. Respiratory: Lungs are clear to auscultation. Skin:  Without evidence of edema, or rash Trunk: BMI Elevated, morbidly obese,  and patient  has normal  posture.  Neurologic exam : The patient is awake and alert, oriented to place and time.  Memory subjective impaired.  Last MMSE 30-30 in 2013.  There is a normal attention span & concentration ability. Speech is non  fluent with mild  aphasia. Mood and affect  Brain organic syndrome due to frontal lobe injuries. Her mood is affected. Patient reports that she has been made aware of her sometimes tangential thought process or circumferential speech.  At times it seems as if her current to some degree of her feelings, and but I assume that this is a manifestation of her previous brain injury and part of the brain organic Psychological syndrome. I added a MOCA test today : the results were evident for visual spatial limitations, and difficulties with the trail making test  26 out of 30 points and all 5 immediate recall words were met.   Cranial nerves: Pupils are equal and briskly reactive to light. With a stepwise, non - smooth horizontal persuit in either direction. Visual fields by finger perimetry are intact. Hearing to finger rub intact.  Facial sensation intact to fine touch. Facial motor strength is symmetric and tongue and uvula move midline.  Motor exam: normal sensory of both upper extremities and lower extremities sensory intact to pinprick and vibration . Coordination is intact in finger to nose  Maneuver and rapid alternating movements  . Equal grip strength . Both upper extremities have normal metria and taxus .  finger-to-nose testing was intact , perhaps slightly slower on the right . The patient is able to walk without assistance -. she has a wider based gait at baseline, walks careful.  There is brisker DTR response on the left side, indicative of mild spasticity.  No upgoing Babinski response.    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  Her memory is fairly unimpaired but executive function and smooth eye movements are affected. 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   2)Seizures resulted from the lesion/ gliosis. Last seizure 2014, while compliant  with meds.  Lifelong medication control is necessary. Driving was not permitted due to yearly seizure activity.  She has had injuries from seizures related falls etc.   3)Just this March 2016 the patient had 2 spells of what sounds like benign positional vertigo with sudden onset severe vertiginous sensation as she turned in bed. The spells were very brief but intense. They only occurred when she turned to her left side in bed she usually sleeps on her right side. Vestibulitis was suspected since a viral illness preceded the spells.   4) hypersomnia, with vivid dreams in the morning hours. Medication related? Patient reports no sleep attacks, no cataplexy.     Plan:  Treatment plan and additional workup :.  refilled keppra and klonopin,  I advised in sleep hygiene. Vestibular evaluation for severe vertigo.

## 2014-04-11 NOTE — Telephone Encounter (Signed)
Appt set. 04-06-14.

## 2014-04-12 ENCOUNTER — Ambulatory Visit: Payer: Medicare Other | Admitting: Gynecology

## 2014-04-21 ENCOUNTER — Other Ambulatory Visit: Payer: Self-pay | Admitting: *Deleted

## 2014-04-21 MED ORDER — LEVETIRACETAM 750 MG PO TABS: 750.0000 mg | ORAL_TABLET | Freq: Two times a day (BID) | ORAL | Status: DC

## 2014-04-21 NOTE — Telephone Encounter (Signed)
Husband came by and medication was not refilled as brand name Keppra.   Ok per Dr.Athar to redo prescription as brand name keppra [facility]   Gave to pt receipt of [facility].

## 2014-04-24 ENCOUNTER — Ambulatory Visit: Payer: Medicare Other | Admitting: Nurse Practitioner

## 2014-04-24 ENCOUNTER — Ambulatory Visit: Payer: Medicare Other | Admitting: Neurology

## 2014-04-24 ENCOUNTER — Telehealth: Payer: Self-pay | Admitting: *Deleted

## 2014-04-24 NOTE — Telephone Encounter (Signed)
The Rx for Keppra sent at [facility] was not marked BMN.  I called [facility].  Spoke with Marlyne Beards.  She was not able to assist me and placed me on hold.  I was then transferred to [employer].  Keppra 750mg  is available in Brand Name, however, since patient specifically requests 500mg ,  one and one half tab (750mg  total) twice daily, Brand Name Only, that is what they will dispense.  She said the Clonazepam was already shipped out on 04/06.  Total duration of call over 30 minutes.  I called the patient back to advise.  They are aware and will call us back if anything further is needed.

## 2014-04-24 NOTE — Telephone Encounter (Signed)
Pts husband here again.   Stating that Keppra 750 is not Brand name.  Would need 500mg  tabs giving 1 1/2 tabs po bid if Keppra brand name.  Also needs clonazepam generic as well.  [facility]  In [place].   I tried to change the pharmacy to [facility] and this would not come up.  Pts husband trying to bypass the [facility] [employer] address I think.  The pt was given BRAND NAME Keppra.  (what ever works (500mg  tab 1 1/2 tabs po bid or 750mg  po bid) What ever is brand name in the [place].  He also stated needed clonazepam generic.  I will pass on to [facility] in Pharmacy.

## 2014-05-23 DIAGNOSIS — E784 Other hyperlipidemia: Secondary | ICD-10-CM | POA: Diagnosis not present

## 2014-05-23 DIAGNOSIS — Z6837 Body mass index (BMI) 37.0-37.9, adult: Secondary | ICD-10-CM | POA: Diagnosis not present

## 2014-05-23 DIAGNOSIS — E6609 Other obesity due to excess calories: Secondary | ICD-10-CM | POA: Diagnosis not present

## 2014-05-23 DIAGNOSIS — I10 Essential (primary) hypertension: Secondary | ICD-10-CM | POA: Diagnosis not present

## 2014-05-23 DIAGNOSIS — R7301 Impaired fasting glucose: Secondary | ICD-10-CM | POA: Diagnosis not present

## 2014-05-23 DIAGNOSIS — F329 Major depressive disorder, single episode, unspecified: Secondary | ICD-10-CM | POA: Diagnosis not present

## 2014-07-04 DIAGNOSIS — L0293 Carbuncle, unspecified: Secondary | ICD-10-CM | POA: Diagnosis not present

## 2014-07-04 DIAGNOSIS — L304 Erythema intertrigo: Secondary | ICD-10-CM | POA: Diagnosis not present

## 2014-07-04 DIAGNOSIS — B958 Unspecified staphylococcus as the cause of diseases classified elsewhere: Secondary | ICD-10-CM | POA: Diagnosis not present

## 2014-07-10 ENCOUNTER — Other Ambulatory Visit: Payer: Self-pay

## 2014-08-18 ENCOUNTER — Encounter (HOSPITAL_COMMUNITY): Payer: Self-pay | Admitting: *Deleted

## 2014-08-18 ENCOUNTER — Emergency Department (HOSPITAL_COMMUNITY): Payer: Medicare Other

## 2014-08-18 ENCOUNTER — Emergency Department (HOSPITAL_COMMUNITY)
Admission: EM | Admit: 2014-08-18 | Discharge: 2014-08-18 | Disposition: A | Discharge status: Home / Self Care | Payer: Medicare Other | Attending: Emergency Medicine | Admitting: Emergency Medicine

## 2014-08-18 DIAGNOSIS — K529 Noninfective gastroenteritis and colitis, unspecified: Secondary | ICD-10-CM | POA: Diagnosis not present

## 2014-08-18 DIAGNOSIS — I10 Essential (primary) hypertension: Secondary | ICD-10-CM | POA: Diagnosis not present

## 2014-08-18 DIAGNOSIS — K625 Hemorrhage of anus and rectum: Secondary | ICD-10-CM | POA: Diagnosis present

## 2014-08-18 DIAGNOSIS — Z8673 Personal history of transient ischemic attack (TIA), and cerebral infarction without residual deficits: Secondary | ICD-10-CM | POA: Insufficient documentation

## 2014-08-18 DIAGNOSIS — H9190 Unspecified hearing loss, unspecified ear: Secondary | ICD-10-CM | POA: Diagnosis not present

## 2014-08-18 DIAGNOSIS — Z79899 Other long term (current) drug therapy: Secondary | ICD-10-CM | POA: Insufficient documentation

## 2014-08-18 DIAGNOSIS — G40909 Epilepsy, unspecified, not intractable, without status epilepticus: Secondary | ICD-10-CM | POA: Insufficient documentation

## 2014-08-18 DIAGNOSIS — Z87891 Personal history of nicotine dependence: Secondary | ICD-10-CM | POA: Diagnosis not present

## 2014-08-18 DIAGNOSIS — Z7982 Long term (current) use of aspirin: Secondary | ICD-10-CM | POA: Insufficient documentation

## 2014-08-18 DIAGNOSIS — N281 Cyst of kidney, acquired: Secondary | ICD-10-CM | POA: Diagnosis not present

## 2014-08-18 DIAGNOSIS — K5730 Diverticulosis of large intestine without perforation or abscess without bleeding: Secondary | ICD-10-CM | POA: Diagnosis not present

## 2014-08-18 DIAGNOSIS — R1084 Generalized abdominal pain: Secondary | ICD-10-CM | POA: Diagnosis not present

## 2014-08-18 HISTORY — DX: Diverticulosis of intestine, part unspecified, without perforation or abscess without bleeding: K57.90

## 2014-08-18 LAB — CBC
HEMATOCRIT: 42.7 % (ref 36.0–46.0)
Hemoglobin: 15.2 g/dL — ABNORMAL HIGH (ref 12.0–15.0)
MCH: 33.1 pg (ref 26.0–34.0)
MCHC: 35.6 g/dL (ref 30.0–36.0)
MCV: 93 fL (ref 78.0–100.0)
Platelets: 251 10*3/uL (ref 150–400)
RBC: 4.59 MIL/uL (ref 3.87–5.11)
RDW: 12.5 % (ref 11.5–15.5)
WBC: 14.1 10*3/uL — ABNORMAL HIGH (ref 4.0–10.5)

## 2014-08-18 LAB — URINE MICROSCOPIC-ADD ON

## 2014-08-18 LAB — URINALYSIS, ROUTINE W REFLEX MICROSCOPIC
Bilirubin Urine: NEGATIVE
Glucose, UA: NEGATIVE mg/dL
Ketones, ur: NEGATIVE mg/dL
NITRITE: NEGATIVE
Protein, ur: NEGATIVE mg/dL
SPECIFIC GRAVITY, URINE: 1.006 (ref 1.005–1.030)
UROBILINOGEN UA: 0.2 mg/dL (ref 0.0–1.0)
pH: 6.5 (ref 5.0–8.0)

## 2014-08-18 LAB — COMPREHENSIVE METABOLIC PANEL
ALT: 19 U/L (ref 14–54)
ANION GAP: 10 (ref 5–15)
AST: 22 U/L (ref 15–41)
Albumin: 3.7 g/dL (ref 3.5–5.0)
Alkaline Phosphatase: 46 U/L (ref 38–126)
BILIRUBIN TOTAL: 0.6 mg/dL (ref 0.3–1.2)
BUN: 7 mg/dL (ref 6–20)
CALCIUM: 9.2 mg/dL (ref 8.9–10.3)
CO2: 24 mmol/L (ref 22–32)
Chloride: 97 mmol/L — ABNORMAL LOW (ref 101–111)
Creatinine, Ser: 0.87 mg/dL (ref 0.44–1.00)
GFR calc Af Amer: >60 mL/min (ref >60)
GFR calc non Af Amer: >60 mL/min (ref >60)
Glucose, Bld: 105 mg/dL — ABNORMAL HIGH (ref 65–99)
Potassium: 3.5 mmol/L (ref 3.5–5.1)
Sodium: 131 mmol/L — ABNORMAL LOW (ref 135–145)
Total Protein: 7.1 g/dL (ref 6.5–8.1)

## 2014-08-18 LAB — I-STAT CG4 LACTIC ACID, ED: Lactic Acid, Venous: 1.64 mmol/L (ref 0.5–2.0)

## 2014-08-18 LAB — LIPASE, BLOOD: Lipase: 52 U/L — ABNORMAL HIGH (ref 22–51)

## 2014-08-18 MED ORDER — ONDANSETRON HCL 4 MG/2ML IJ SOLN
4.0000 mg | Freq: Once | INTRAMUSCULAR | Status: AC
  Administered 2014-08-18: 4 mg via INTRAVENOUS
  Filled 2014-08-18: qty 2

## 2014-08-18 MED ORDER — IOHEXOL 300 MG/ML  SOLN
100.0000 mL | Freq: Once | INTRAMUSCULAR | Status: AC | PRN
  Administered 2014-08-18: 100 mL via INTRAVENOUS

## 2014-08-18 MED ORDER — IOHEXOL 300 MG/ML  SOLN
25.0000 mL | Freq: Once | INTRAMUSCULAR | Status: AC | PRN
  Administered 2014-08-18: 25 mL via ORAL

## 2014-08-18 NOTE — ED Notes (Signed)
Patient transported to CT 

## 2014-08-18 NOTE — ED Provider Notes (Signed)
CSN: [id number]     Arrival date & time 08/18/14  1444 History   First MD Initiated Contact with Patient 08/18/14 1719     Chief Complaint  Patient presents with  . Rectal Bleeding  . 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 The patient states that 2 days ago, she was awakened from sleep with an episode of nonbloody diarrhea. After that, she began having bright red blood in her stool. She states that she has a lot of bloating and feelings of gas pains. The pain is crampy and after the pain, she passes blood clots. She has had no further episodes of diarrhea and no vomiting. No chest pain or shortness of breath. No fevers. The pain is migratory.  Patient is a 74 y.o. female presenting with hematochezia and abdominal pain. The history is provided by the patient.  Rectal Bleeding Associated symptoms: abdominal pain   Abdominal Pain Associated symptoms: hematochezia     Past Medical History  Diagnosis Date  . 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 Stroke, hemorrhagic aneurysm bleed.   Marland Kitchen HTN (hypertension)   . Vestibulitis of ear   . Seizures   . Hearing loss   . Memory loss   . Diverticulosis    Past Surgical History  Procedure Laterality Date  . Cerebral aneurysm repair    . Tonsillectomy     Family History  Problem Relation Age of Onset  . Dementia Mother   . Stroke Mother   . Ovarian cancer Paternal Grandmother   . Thyroid disease Mother   . Osteoporosis Mother    History  Substance Use Topics  . Smoking status: Former Research scientist (life sciences)  . Smokeless tobacco: Never Used  . Alcohol Use: No   OB History    Gravida Para Term Preterm AB TAB SAB Ectopic Multiple Living   2 2 2       2      Review of  Systems  Gastrointestinal: Positive for abdominal pain and hematochezia.    10 Systems reviewed and are negative for acute change except as noted in the HPI.   Allergies  Prozac; Sulfa antibiotics; Levaquin; Micardis; Prednisone; Augmentin; Avelox; and Norvasc  Home Medications   Prior to Admission medications   Medication Sig Start Date End Date Taking? Authorizing Provider  aspirin EC 81 MG tablet Take 81 mg by mouth daily.   Yes Historical Provider, MD  atenolol (TENORMIN) 50 MG tablet Take 50 mg by mouth 2 (two) times daily.   Yes Historical Provider, MD  captopril (CAPOTEN) 50 MG tablet Take 50 mg by mouth 2 (two) times daily.   Yes Historical Provider, MD  Cholecalciferol (VITAMIN D PO) Take 2,000 Units by mouth. Twice daily   Yes Historical Provider, MD  clonazePAM (KLONOPIN) 2 MG tablet Take 1 tablet (2 mg total) by mouth at bedtime. 1/2 tablet daily Patient taking differently: Take 2 mg by mouth at bedtime.  04/06/14  Yes Carmen Dohmeier, MD  fexofenadine (ALLEGRA) 180 MG tablet Take 180 mg by mouth daily.    Yes Historical Provider, MD  fluticasone (FLONASE) 50 MCG/ACT nasal spray Place 2 sprays into the nose daily as needed for allergies. Spray twice daily as needed   Yes Historical Provider, MD  guaiFENesin (MUCINEX) 600 MG 12 hr tablet Take 1,200 mg by mouth 2 (two)  times daily.   Yes Historical Provider, MD  levETIRAcetam (KEPPRA) 500 MG tablet Take 750 mg by mouth 2 (two) times daily.   Yes Historical Provider, MD  LEXAPRO 20 MG tablet Take 1 tablet (20 mg total) by mouth daily. Morning dosage Patient taking differently: Take 20 mg by mouth at bedtime. Morning dosage 04/21/13  Yes Carmen Dohmeier, MD  simvastatin (ZOCOR) 10 MG tablet Take 10 mg by mouth at bedtime.   Yes Historical Provider, MD  triamterene-hydrochlorothiazide (DYAZIDE) 37.5-25 MG per capsule Take 1 capsule by mouth daily. Morning dosage   Yes Historical Provider, MD   BP 151/80 mmHg  Pulse 59  Temp(Src)  97.3 F (36.3 C) (Oral)  Resp 18  SpO2 94% Physical Exam  Constitutional: She is oriented to person, place, and time. She appears well-developed and well-nourished. No distress.  HENT:  Head: Normocephalic and atraumatic.  Moist mucous membranes  Eyes: Conjunctivae are normal. Pupils are equal, round, and reactive to light.  Neck: Neck supple.  Cardiovascular: Normal rate, regular rhythm and normal heart sounds.   No murmur heard. Pulmonary/Chest: Effort normal and breath sounds normal.  Abdominal: Soft. Bowel sounds are normal.  Generalized tenderness to palpation and mild distention without any focal tenderness or peritonitis, no rebound or guarding  Musculoskeletal: She exhibits no edema.  Neurological: She is alert and oriented to person, place, and time.  Fluent speech  Skin: Skin is warm and dry.  Psychiatric: She has a normal mood and affect. Judgment normal.  Nursing note and vitals reviewed.   ED Course  Procedures (including critical care time) Labs Review Labs Reviewed  LIPASE, BLOOD - Abnormal; Notable for the following:    Lipase 52 (*)    All other components within normal limits  COMPREHENSIVE METABOLIC PANEL - Abnormal; Notable for the following:    Sodium 131 (*)    Chloride 97 (*)    Glucose, Bld 105 (*)    All other components within normal limits  CBC - Abnormal; Notable for the following:    WBC 14.1 (*)    Hemoglobin 15.2 (*)    All other components within normal limits  URINALYSIS, ROUTINE W REFLEX MICROSCOPIC (NOT AT [facility]) - Abnormal; Notable for the following:    Hgb urine dipstick TRACE (*)    Leukocytes, UA TRACE (*)    All other components within normal limits  STOOL CULTURE  URINE MICROSCOPIC-ADD ON  POC OCCULT BLOOD, ED  I-STAT CG4 LACTIC ACID, ED    Imaging Review Ct Abdomen Pelvis W Contrast  08/18/2014   CLINICAL DATA:  Acute onset of diarrhea. Rectal bleeding with large clots. Abdominal bloating. Initial encounter.  EXAM: CT ABDOMEN  AND PELVIS WITH CONTRAST  TECHNIQUE: Multidetector CT imaging of the abdomen and pelvis was performed using the standard protocol following bolus administration of intravenous contrast.  CONTRAST:  147mL OMNIPAQUE IOHEXOL 300 MG/ML  SOLN  COMPARISON:  CT of the abdomen and pelvis from 04/09/2012  FINDINGS: The visualized lung bases are clear.  The liver and spleen are unremarkable in appearance. The gallbladder is within normal limits. The pancreas and adrenal glands are unremarkable.  An exophytic 1.4 cm cyst is noted arising at the upper pole of the left kidney. There is also a 4.0 cm cyst at the interpole region of the left kidney. The kidneys are otherwise unremarkable. There is no evidence of hydronephrosis. No renal or ureteral stones are seen. No perinephric stranding is appreciated.  The small bowel is unremarkable in  appearance. The stomach is within normal limits. No acute vascular abnormalities are seen. Relatively diffuse calcification seen along the abdominal aorta and its branches.  There is wall thickening along the mid to distal transverse, descending and proximal sigmoid colon, with associated soft tissue inflammation and trace fluid, compatible with segmental colitis. This is either infectious or inflammatory in nature. There is no definite evidence of ischemia.  The remainder of the colon is grossly unremarkable, aside from scattered diverticulosis along the sigmoid colon. Trace fluid within the pelvis likely reflects the acute colonic process.  The bladder is mildly distended and grossly unremarkable. The uterus is within normal limits. The ovaries are grossly symmetric. No suspicious adnexal masses are seen. No inguinal lymphadenopathy is seen.  No acute osseous abnormalities are identified.  IMPRESSION: 1. Acute segmental colitis involving the mid to distal transverse, descending and proximal sigmoid colon, with soft tissue inflammation and trace fluid. This is either infectious or inflammatory  in nature. Trace fluid tracks into the pelvis. 2. Small left renal cysts seen. 3. Relatively diffuse calcification along the abdominal aorta and its branches. 4. Scattered diverticulosis along the sigmoid colon, without evidence of diverticulitis.   Electronically Signed   By: Garald Balding M.D.   On: 08/18/2014 19:20     EKG Interpretation None      MDM   Final diagnoses:  Colitis   74 year old female who presents with a few days of generalized abdominal pain with passage of blood clots. Patient well-appearing with normal vital signs at presentation. Obtained lab work listed above as well as CT scan to evaluate for diverticulitis.  Labwork shows stable hemoglobin, WBC 14.1. CT shows acute colitis involving the transverse, descending, and proximal sigmoid colon, infectious versus inflammatory. No evidence of diverticulitis. On reexamination, the patient reports that she is still having abdominal cramping but does feel like her diarrhea is improving. She has had no vomiting and has tolerated PO here. I feel that her symptoms are likely due to a viral or bacterial process and will be self-limited. Currently I feel that the risk of antibiotics outweighs the potential benefits. I've explained to her supportive care instructions and the importance of PCP follow-up if her symptoms continue. Return precautions reviewed and patient voiced understanding. Patient discharged in satisfactory condition.  Sharlett Iles, MD 08/18/14 2033

## 2014-08-18 NOTE — ED Notes (Signed)
Pt reports hx of diverticulosis, had diarrhea on wed, feels bloated and passing blood clots.

## 2014-08-18 NOTE — Discharge Instructions (Signed)

## 2014-08-18 NOTE — ED Notes (Signed)
MD at bedside. 

## 2014-08-18 NOTE — ED Notes (Signed)
Pt ambulated to the bathroom with ease 

## 2014-08-18 NOTE — ED Notes (Signed)
Pt given sterile cup and instructions to provide stool sample and return to ED for testing. Pt verbalized understanding. Pt left with all belongings.

## 2014-08-21 LAB — POC OCCULT BLOOD, ED: FECAL OCCULT BLD: POSITIVE — AB

## 2014-09-01 DIAGNOSIS — R933 Abnormal findings on diagnostic imaging of other parts of digestive tract: Secondary | ICD-10-CM | POA: Diagnosis not present

## 2014-09-01 DIAGNOSIS — K5900 Constipation, unspecified: Secondary | ICD-10-CM | POA: Diagnosis not present

## 2014-09-01 DIAGNOSIS — K625 Hemorrhage of anus and rectum: Secondary | ICD-10-CM | POA: Diagnosis not present

## 2014-09-01 DIAGNOSIS — K559 Vascular disorder of intestine, unspecified: Secondary | ICD-10-CM | POA: Diagnosis not present

## 2014-09-01 DIAGNOSIS — R197 Diarrhea, unspecified: Secondary | ICD-10-CM | POA: Diagnosis not present

## 2014-09-21 DIAGNOSIS — K5730 Diverticulosis of large intestine without perforation or abscess without bleeding: Secondary | ICD-10-CM | POA: Diagnosis not present

## 2014-09-21 DIAGNOSIS — K625 Hemorrhage of anus and rectum: Secondary | ICD-10-CM | POA: Diagnosis not present

## 2014-09-26 DIAGNOSIS — K5790 Diverticulosis of intestine, part unspecified, without perforation or abscess without bleeding: Secondary | ICD-10-CM | POA: Diagnosis not present

## 2014-10-11 ENCOUNTER — Telehealth: Payer: Self-pay | Admitting: Neurology

## 2014-10-11 ENCOUNTER — Ambulatory Visit (INDEPENDENT_AMBULATORY_CARE_PROVIDER_SITE_OTHER): Payer: Medicare Other | Admitting: Neurology

## 2014-10-11 ENCOUNTER — Encounter: Payer: Self-pay | Admitting: Neurology

## 2014-10-11 VITALS — HR 60 | Resp 20 | Ht 65.0 in | Wt 213.0 lb

## 2014-10-11 DIAGNOSIS — R569 Unspecified convulsions: Secondary | ICD-10-CM

## 2014-10-11 DIAGNOSIS — I609 Nontraumatic subarachnoid hemorrhage, unspecified: Secondary | ICD-10-CM | POA: Diagnosis not present

## 2014-10-11 DIAGNOSIS — G4089 Other seizures: Secondary | ICD-10-CM | POA: Diagnosis not present

## 2014-10-11 DIAGNOSIS — F09 Unspecified mental disorder due to known physiological condition: Secondary | ICD-10-CM

## 2014-10-11 DIAGNOSIS — G40409 Other generalized epilepsy and epileptic syndromes, not intractable, without status epilepticus: Secondary | ICD-10-CM

## 2014-10-11 MED ORDER — LEVETIRACETAM 750 MG PO TABS: 750.0000 mg | ORAL_TABLET | Freq: Two times a day (BID) | ORAL | Status: DC

## 2014-10-11 MED ORDER — LEVETIRACETAM 500 MG PO TABS: 750.0000 mg | ORAL_TABLET | Freq: Two times a day (BID) | ORAL | Status: DC

## 2014-10-11 MED ORDER — SUVOREXANT 10 MG PO TABS: 10.0000 mg | ORAL_TABLET | Freq: Every evening | ORAL | Status: DC

## 2014-10-11 MED ORDER — CLONAZEPAM 2 MG PO TABS: 2.0000 mg | ORAL_TABLET | Freq: Every day | ORAL | Status: DC

## 2014-10-11 NOTE — Telephone Encounter (Signed)
I corrected the patient's medication to 750 mg Keppra twice a day. I have marked the prescription as brand name only.

## 2014-10-11 NOTE — Patient Instructions (Addendum)
Clonazepam tablets What is this medicine? CLONAZEPAM (kloe NA ze pam) is a benzodiazepine. It is used to treat certain types of seizures. It is also used to treat panic disorder. This medicine may be used for other purposes; ask your health care Beth [name] or pharmacist if you have questions. COMMON BRAND NAME(S): Ceberclon, Klonopin What should I tell my health care Beth [name] before I take this medicine? They need to know if you have any of these conditions: -an alcohol or drug abuse problem -bipolar disorder, depression, psychosis or other mental health condition -glaucoma -kidney or liver disease -lung or breathing disease -myasthenia gravis -Parkinson's disease -seizures or a history of seizures -suicidal thoughts -an unusual or allergic reaction to clonazepam, other benzodiazepines, foods, dyes, or preservatives -pregnant or trying to get pregnant -breast-feeding How should I use this medicine? Take this medicine by mouth with a glass of water. Follow the directions on the prescription label. If it upsets your stomach, take it with food or milk. Take your medicine at regular intervals. Do not take it more often than directed. Do not stop taking or change the dose except on the advice of your doctor or health care professional. A special MedGuide will be given to you by the pharmacist with each prescription and refill. Be sure to read this information carefully each time. Talk to your pediatrician regarding the use of this medicine in children. Special care may be needed. Overdosage: If you think you have taken too much of this medicine contact a poison control center or emergency room at once. NOTE: This medicine is only for you. Do not share this medicine with others. What if I miss a dose? If you miss a dose, take it as soon as you can. If it is almost time for your next dose, take only that dose. Do not take double or extra doses. What may interact with this medicine? -herbal or  dietary supplements -medicines for depression, anxiety, or psychotic disturbances -medicines for fungal infections like fluconazole, itraconazole, ketoconazole, voriconazole -medicines for HIV infection or AIDS -medicines for sleep -prescription pain medicines -propantheline -rifampin -sevelamer -some medicines for seizures like carbamazepine, phenobarbital, phenytoin, primidone This list may not describe all possible interactions. Give your health care Beth [name] a list of all the medicines, herbs, non-prescription drugs, or dietary supplements you use. Also tell them if you smoke, drink alcohol, or use illegal drugs. Some items may interact with your medicine. What should I watch for while using this medicine? Visit your doctor or health care professional for regular checks on your progress. Your body may become dependent on this medicine. If you have been taking this medicine regularly for some time, do not suddenly stop taking it. You must gradually reduce the dose or you may get severe side effects. Ask your doctor or health care professional for advice before increasing or decreasing the dose. Even after you stop taking this medicine it can still affect your body for several days. If you suffer from several types of seizures, this medicine may increase the chance of grand mal seizures (epilepsy). Let your doctor or health care professional know, he or she may want to prescribe an additional medicine. You may get drowsy or dizzy. Do not drive, use machinery, or do anything that needs mental alertness until you know how this medicine affects you. To reduce the risk of dizzy and fainting spells, do not stand or sit up quickly, especially if you are an older patient. Alcohol may increase dizziness and drowsiness. Avoid alcoholic   drinks. Do not treat yourself for coughs, colds or allergies without asking your doctor or health care professional for advice. Some ingredients can increase possible side  effects. The use of this medicine may increase the chance of suicidal thoughts or actions. Pay special attention to how you are responding while on this medicine. Any worsening of mood, or thoughts of suicide or dying should be reported to your health care professional right away. Women who become pregnant while using this medicine may enroll in the [place] Pregnancy Registry by calling [phone]. This registry collects information about the safety of antiepileptic drug use during pregnancy. What side effects may I notice from receiving this medicine? Side effects that you should report to your doctor or health care professional as soon as possible: -allergic reactions like skin rash, itching or hives, swelling of the face, lips, or tongue -changes in vision -confusion -depression -hallucinations -mood changes, excitability or aggressive behavior -movement difficulty, staggering or jerky movements -muscle cramps, weakness -tremors -unusual eye movements Side effects that usually do not require medical attention (report to your doctor or health care professional if they continue or are bothersome): -constipation or diarrhea -difficulty sleeping, nightmares -dizziness, drowsiness -headache -increased saliva from your mouth -nausea, vomiting This list may not describe all possible side effects. Call your doctor for medical advice about side effects. You may report side effects to FDA at 1-800-FDA-1088. Where should I keep my medicine? Keep out of the reach of children. This medicine can be abused. Keep your medicine in a safe place to protect it from theft. Do not share this medicine with anyone. Selling or giving away this medicine is dangerous and against the law. Store at room temperature between 15 and 30 degrees C (59 and 86 degrees F). Protect from light. Keep container tightly closed. Throw away any unused medicine after the expiration date. NOTE: This  sheet is a summary. It may not cover all possible information. If you have questions about this medicine, talk to your doctor, pharmacist, or health care Beth [name].  2015, Elsevier/Gold Standard. (2008-11-24 19:16:36) Suvorexant oral tablets What is this medicine? SUVOREXANT (su-vor-EX-ant) is used to treat insomnia. This medicine helps you to fall asleep and sleep through the night. This medicine may be used for other purposes; ask your health care Beth [name] or pharmacist if you have questions. COMMON BRAND NAME(S): Belsomra What should I tell my health care Beth [name] before I take this medicine? They need to know if you have any of these conditions: -depression -history of a drug or alcohol abuse problem -history of daytime sleepiness -history of sudden onset of muscle weakness (cataplexy) -liver disease -lung or breathing disease -narcolepsy -suicidal thoughts, plans, or attempt; a previous suicide attempt by you or a family member -an unusual or allergic reaction to suvorexant, other medicines, foods, dyes, or preservatives -pregnant or trying to get pregnant -breast-feeding How should I use this medicine? Take this medicine by mouth within 30 minutes of going to bed. Do not take it unless you are able to stay in bed a full night before you must be active again. Follow the directions on the prescription label. For best results, it is better to take this medicine on an empty stomach. Do not take your medicine more often than directed. Do not stop taking this medicine on your own. Always follow your doctor or health care professional's advice. A special MedGuide will be given to you by the pharmacist with each prescription and refill. Be sure to read  this information carefully each time. Talk to your pediatrician regarding the use of this medicine in children. Special care may be needed. Overdosage: If you think you've taken too much of this medicine contact a poison control center or  emergency room at once. Overdosage: If you think you have taken too much of this medicine contact a poison control center or emergency room at once. NOTE: This medicine is only for you. Do not share this medicine with others. What if I miss a dose? This medicine should only be taken immediately before going to sleep. Do not take double or extra doses. What may interact with this medicine? -alcohol -antiviral medicines for HIV or AIDS -aprepitant -carbamazepine -certain antibiotics like ciprofloxacin, clarithromycin, erythromycin, telithromycin -certain medicines for depression or psychotic disturbances -certain medicines for fungal infections like ketoconazole, posaconazole, fluconazole, or itraconazole -conivaptan -digoxin -diltiazem -grapefruit juice -imatinib -medicines for anxiety or sleep -phenytoin -rifampin -verapamil This list may not describe all possible interactions. Give your health care Beth [name] a list of all the medicines, herbs, non-prescription drugs, or dietary supplements you use. Also tell them if you smoke, drink alcohol, or use illegal drugs. Some items may interact with your medicine. What should I watch for while using this medicine? Visit your doctor or health care professional for regular checks on your progress. Keep a regular sleep schedule by going to bed at about the same time each night. Avoid caffeine-containing drinks in the evening hours. When sleep medicines are used every night for more than a few weeks, they may stop working. Do not increase the dose on your own. Talk to your doctor if your insomnia worsens or is not better within 7 to 10 days. You may not be able to remember things that you do in the hours after you take this medicine. Some people have reported driving, making phone calls, or preparing and eating food while asleep after taking sleep medicine. Take this medicine right before going to sleep. Tell your doctor if you are having any problems  with your memory. Do not take this medicine unless you are able to stay in bed for a full night (7 to 8 hours) before you must be active again and do not drive or perform other activities requiring full alertness within 8 hours of a dose. Do not drive, use machinery, or do anything that needs mental alertness the day after you take the 20 mg dose of this medicine. The use of lower doses (10 mg) also has the potential to cause driving impairment the next day. You may have a decrease in mental alertness the day after use, even if you feel that you are fully awake. Tell your doctor if you will need to perform activities requiring full alertness, such as driving, the next day. You may get drowsy or dizzy. Do not stand or sit up quickly, especially if you are an older patient. This reduces the risk of dizzy or fainting spells. Alcohol may interfere with the effect of this medicine. Avoid alcoholic drinks. Do not use this medicine if you have had alcohol that evening or before bed. If you or your family notice any changes in your behavior, or if you have any unusual or disturbing thoughts such as depression or suicidal thoughts, call your doctor right away. What side effects may I notice from receiving this medicine? Side effects that you should report to your doctor or health care professional as soon as possible: -allergic reactions like skin rash, itching or hives, swelling of  the face, lips, or tongue -confusion -depressed mood -feeling faint or lightheaded, falls -hallucinations -inability to move or speak for up to several minutes while you are going to sleep or waking up -memory loss -periods of leg weakness lasting from seconds to a few minutes -problems with balance, speaking, walking -restlessness, excitability, or feelings of agitation -unusual activities while asleep like driving, eating, making phone calls Side effects that usually do not require medical attention (Report these to your  doctor or health care professional if they continue or are bothersome.): -daytime drowsiness -diarrhea -dizziness -headache This list may not describe all possible side effects. Call your doctor for medical advice about side effects. You may report side effects to FDA at 1-800-FDA-1088. Where should I keep my medicine? Keep out of the reach of children. This medicine can be abused. Keep your medicine in a safe place to protect it from theft. Do not share this medicine with anyone. Selling or giving away this medicine is dangerous and against the law. Store at room temperature between 15 and 30 degrees C (59 and 86 degrees F). Throw away any unused medicine after the expiration date. NOTE: This sheet is a summary. It may not cover all possible information. If you have questions about this medicine, talk to your doctor, pharmacist, or health care Beth [name].  2015, Elsevier/Gold Standard. (2012-09-01 19:37:59)

## 2014-10-11 NOTE — Telephone Encounter (Signed)
Pt's husband states that she takes keppra 750 mg BID. He wants to prescription changed to reflect this and wants the RX to say "brand name only". Then [facility]

## 2014-10-11 NOTE — Telephone Encounter (Signed)
Pt's husband called stating pt needs brand name Keppra only sent to [facility]. Please call @ [phone].

## 2014-10-11 NOTE — Progress Notes (Signed)
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   HPI:  Beth [name] is a 74 y.o. female here seen originally many years ago as a referral from Dr.Kevin Little ,  Beth [name] is an established patient of my practice. 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  She also has a history of hypertension, osteopenia, vertigo and some mild hearing loss, classified as vestibulitis in her older records.  Surgical history is positive for right foot fracture in May 2010 and 4 surgeries to the ankle. Her ankle was fractured in a seizure related incident 2010,  twisting her foot in the midst of her typical left twisting body movements during a seizure.  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  She is taking 20 mg daily. She also takes vitamin D against fatigue. Her affect is better controlled and she feels more assured, and now is less socially isolated.  Beth [name] had been seizure-free for over 12 months by the time of her April 2014 visit, now she reports that she had one seizure around Christmas 2014, while compliant on her current regimen of Keppra and Clonazepam.  She fills her medications at [facility] on [address]  in [place].Vestibular evaluation for severe vertigo.   Social history : The patient is 74 years of age she is right-handed , married lives with her husband Jenny Reichmann, the couple has 2 adult children.   The patient is a nonsmoker non-alcohol drinker , she may drink 2 cups of caffeine daily  And she has never used illegal drugs . She lost her mother in November 2014  (Thanksgiving) at age 54, while in  hospice in [place]. Florie was sleep deprived from visiting again and again.  She had in the past very irregular sleep habits and sleep hygiene has been discussed, but failed to be implemented to help her to get more restful nocturnal sleep. Her family history in short:  patient's mother had age-related dementia, she also suffered a stroke in the past,  the patient's daughter had breast cancer and seizures.  Interval  History : 10-11-14  Patient returned for a yearly follow up. She reports medical problems:  a carbuncle with MRSA, diagnosed spring 2016  and treated with ATB  ( doxicycline) by Dr. Amy Martinique , Dermatologist . The patient tolerated the oral doxycycline fine. Mrs. Ghuman's cognitive status remains stable we performed a [place] cognitive assessment test today and she scored well on the clock drawing she has a mild tremor which impairs the contour of the clock face, she scored well on this Trail Making Test name 3 animals had some trouble again this tremor on drawing a cube. Was able to do the attention tests but not subtraction. Language and abstraction were tested the patient only had a word fluency test of 9 points. She was able to score 4 out of 5 recall words and was fully oriented to date place and time. She scored 26 out of 30 points, is a  HS graduate. 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 No seizures in the interval time.  Review of Systems: Out of a complete 14 system review, the patient complains of only the following symptoms, and all other reviewed systems are negative. Geriatric depression score was endorsed at 3 points out of 15. Erratic sleep pattern,  Insomnia at night and daytime sleepiness reported.    Social History   Social History  . Marital Status: Married    Spouse Name: Jenny Reichmann  . Number of Children: 2  . Years of Education: 12   Occupational History  . Not on file.   Social History Main Topics   . Smoking status: Former Research scientist (life sciences)  . Smokeless tobacco: Never Used  . Alcohol Use: No  . Drug Use: No  . Sexual Activity: Not on file   Other Topics Concern  . Not on file   Social History Narrative   Patient is married (Beth [name]) and lives at home with her husband.   Patient is retired.   Patient has two children.   Patient has a high school education.   Patient is right-handed.   Patient drinks 2 big mugs of coffee daily and sometimes tea in the evenings.    Family History  Problem Relation Age of Onset  . Dementia Mother   . Stroke Mother   . Ovarian cancer Paternal Grandmother   . Thyroid disease Mother   . Osteoporosis Mother     Past Medical History  Diagnosis Date  . 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 Stroke, hemorrhagic aneurysm bleed.   Marland Kitchen HTN (hypertension)   . Vestibulitis of ear   . Seizures   . Hearing loss   . Memory loss   . Diverticulosis     Past Surgical History  Procedure Laterality Date  . Cerebral aneurysm repair    . Tonsillectomy      Current Outpatient Prescriptions  Medication Sig Dispense Refill  . aspirin EC 81 MG tablet Take 81 mg by mouth daily.    Marland Kitchen atenolol (TENORMIN) 50 MG tablet Take 50 mg by mouth 2 (two) times daily.    . captopril (CAPOTEN) 50 MG tablet Take 50 mg by mouth 2 (two) times daily.    . Cholecalciferol (VITAMIN D PO) Take 2,000 Units by mouth. Twice daily    . clonazePAM (KLONOPIN) 2 MG tablet Take 1 tablet (2 mg total) by mouth at bedtime. 1/2 tablet daily (Patient taking differently: Take 2 mg by mouth at bedtime. ) 90 tablet 1  . fexofenadine (ALLEGRA) 180 MG tablet Take 180 mg by mouth daily.     . fluticasone (FLONASE) 50 MCG/ACT nasal spray Place 2 sprays into the nose daily as needed for allergies. Spray twice daily as needed    . guaiFENesin (MUCINEX) 600 MG 12 hr tablet Take 1,200 mg by mouth 2 (two) times daily.    Marland Kitchen levETIRAcetam (KEPPRA) 500 MG tablet  Take 750 mg by mouth 2 (two) times daily.    Marland Kitchen LEXAPRO 20 MG tablet Take 1 tablet (20 mg total) by mouth daily. Morning dosage (Patient taking differently: Take 20 mg by mouth at bedtime. Morning dosage) 90 tablet 3  . simvastatin (ZOCOR) 10 MG tablet Take 10 mg by mouth at bedtime.    . triamterene-hydrochlorothiazide (DYAZIDE) 37.5-25 MG per capsule Take 1 capsule by mouth daily. Morning dosage     No current facility-administered medications for this visit.    Allergies as of 10/11/2014 - Review Complete 10/11/2014  Allergen Reaction Noted  . Prozac [fluoxetine hcl] Other (  See Comments) 05/12/2012  . Sulfa antibiotics Nausea And Vomiting and Other (See Comments) 08/18/2014  . Levaquin [levofloxacin in d5w] Other (See Comments) 04/05/2013  . Micardis [telmisartan] Swelling and Other (See Comments) 05/12/2012  . Prednisone Other (See Comments) 05/12/2012  . Augmentin [amoxicillin-pot clavulanate] Rash 05/12/2012  . Avelox [moxifloxacin hcl in nacl] Rash 05/12/2012  . Norvasc [amlodipine besylate] Other (See Comments) 05/12/2012    Vitals: BP   Pulse 60  Resp 20  Ht _0  (1.651 m)  Wt 213 lb (96.616 kg)  BMI 35.44 kg/m2 Last Weight:  Wt Readings from Last 1 Encounters:  10/11/14 213 lb (96.616 kg)   Last Height:   Ht Readings from Last 1 Encounters:  10/11/14 _1  (1.651 m)    Physical exam:  General: The patient is awake, alert and appears not in acute distress. The patient is well groomed.  Head: Normocephalic, atraumatic. Neck is supple. Mallampati 3 ,  Neck circumference 15.75    ;Cardiovascular:  Regular rate and rhythm- without  murmurs or carotid bruit, and without distended neck veins. Respiratory: Lungs are clear to auscultation. Skin:  Without evidence of edema, or rash Trunk: BMI is elevated,  morbidly obese,  But the  patient has normal posture.  Neurologic exam : The patient is awake and alert, oriented to place and time.  Memory subjective impaired.   Last MMSE 30-30 in 2013. This time 26-30 points.   There is a reduced  attention span & concentration ability.  Speech is non- fluent with mild  aphasia.  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 Her mood is affected. Patient reports that she has been made aware of her sometimes tangential thought process or circumferential speech.   At times it seems as if her current to some degree of her feelings, and but I assume that this is a manifestation of her previous brain injury and part of the brain organic Psychological syndrome. I added a MOCA test today : 26-30  On 10-11-14 , and  the results were evident for visual spatial limitations, and difficulties with the trail making test . Last test in 2015 : 26 out of 30 points and all 5 immediate recall words were met.   Cranial nerves: Pupils are equal and briskly reactive to light. With a stepwise, non - smooth horizontal persuit in either direction. Visual fields by finger perimetry are intact. Hearing to finger rub intact. Facial sensation intact to fine touch. Facial motor strength is symmetric and tongue and uvula move midline.  Motor exam: normal sensory of both upper extremities and lower extremities sensory intact to pinprick and vibration . Coordination is intact in finger to nose  Maneuver and rapid alternating movements . Equal grip strength . Both upper extremities have normal metria and taxus .  finger-to-nose testing was intact , perhaps slightly slower on the right . The patient is able to walk without assistance -. she has a wider based gait at baseline, walks careful. There is brisker DTR response on the left side, indicative of mild spasticity.  No upgoing Babinski response.   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 This is reflected in psycho organic changes, cognitive slowing and mood changes.  Her memory is fairly unimpaired but executive function and smooth eye movements are affected.  2)Seizures resulted from the lesion/ gliosis. Last seizure 2014, while compliant  with meds. Recent treatment with Doxicycline was surprisingly well tolerated.  Lifelong medication control is necessary. Driving was not permitted due to yearly seizure activity.  She has had injuries from seizures related falls etc.   3)In March 2016 the patient had 2 spells of what sounds like benign positional vertigo with sudden onset severe vertiginous sensation as she turned in bed. This has no returned.  The spells were very brief but intense.  Vestibulitis was suspected since a viral illness preceded the spells.   4) hypersomnia, with vivid dreams in the morning hours. Medication related? Patient reports no sleep attacks, no cataplexy. She is daytime drowsy.   30 minutes assessment .More than  50% of face-to-face time dedicated to the results of the cognitive testing the discussion of visual spatial difficulties, tremor in the possible side effect of seizure medication on her long-term well-being. I will refer Mrs. Begue's seizure medication today she is not due until March she just received her last 90 days supply. She is taking clonazepam at night which is supposed to help her to sleep. She reports falling asleep till a couple of hours before she sleeps. This has been a pattern of hers ever since she had the aneurysm. I would like to offer her Belsomra  to see if this would help her to go to sleep easier and stay asleep longer, this medication is not deemed to be an addictive medication. She is rather anxious and worried, keeping her from sleeping.    Plan:  Treatment plan and additional workup :. Refilled keppra and klonopin, added Belsomra 10 mg and 20 mg samples.  I advised in sleep hygiene.  Reduction in caffeine, eliminate stimulation form the last 2 hours of your day.   CC: PCP.

## 2014-10-12 ENCOUNTER — Telehealth: Payer: Self-pay

## 2014-10-12 ENCOUNTER — Other Ambulatory Visit: Payer: Self-pay

## 2014-10-12 DIAGNOSIS — R569 Unspecified convulsions: Secondary | ICD-10-CM

## 2014-10-12 DIAGNOSIS — I609 Nontraumatic subarachnoid hemorrhage, unspecified: Secondary | ICD-10-CM

## 2014-10-12 MED ORDER — LEVETIRACETAM 750 MG PO TABS: 750.0000 mg | ORAL_TABLET | Freq: Two times a day (BID) | ORAL | Status: DC

## 2014-10-12 NOTE — Telephone Encounter (Signed)
Called and spoke to pt's husband and informed him that Dr. Brett Fairy changed the RX to keppra 750 mg BID and that it was marked brand name only. Pt's husband said that the [facility] called him and said that an RX is ready for pick up there. He wants me to make sure it wasn't the keppra. It was, and I informed him that I would send it to [facility] and call the [facility] and inform them that the RX was sent to [facility]. Pt's husband verbalized understanding.  I spoke to the [facility] and verified with Laverna Peace, that the keppra was sent to [facility].

## 2014-10-12 NOTE — Telephone Encounter (Signed)
Pt's husband called stating Keppra should go to [facility] in [place] each time for monthly refills and this is for all her medications!

## 2014-10-12 NOTE — Telephone Encounter (Signed)
I called and left a message on home phone per DPR, that I have changed the pharmacy to only say [facility] and to let us know if there are any further problems with the pharmacy.

## 2014-10-20 DIAGNOSIS — H1850 Unspecified hereditary corneal dystrophies: Secondary | ICD-10-CM | POA: Diagnosis not present

## 2014-10-20 DIAGNOSIS — H1859 Other hereditary corneal dystrophies: Secondary | ICD-10-CM | POA: Diagnosis not present

## 2014-11-27 DIAGNOSIS — J309 Allergic rhinitis, unspecified: Secondary | ICD-10-CM | POA: Diagnosis not present

## 2014-11-27 DIAGNOSIS — Z23 Encounter for immunization: Secondary | ICD-10-CM | POA: Diagnosis not present

## 2014-11-27 DIAGNOSIS — Z0000 Encounter for general adult medical examination without abnormal findings: Secondary | ICD-10-CM | POA: Diagnosis not present

## 2014-11-27 DIAGNOSIS — F329 Major depressive disorder, single episode, unspecified: Secondary | ICD-10-CM | POA: Diagnosis not present

## 2014-11-27 DIAGNOSIS — E6609 Other obesity due to excess calories: Secondary | ICD-10-CM | POA: Diagnosis not present

## 2014-11-27 DIAGNOSIS — Z6836 Body mass index (BMI) 36.0-36.9, adult: Secondary | ICD-10-CM | POA: Diagnosis not present

## 2014-11-27 DIAGNOSIS — R569 Unspecified convulsions: Secondary | ICD-10-CM | POA: Diagnosis not present

## 2014-11-27 DIAGNOSIS — E784 Other hyperlipidemia: Secondary | ICD-10-CM | POA: Diagnosis not present

## 2014-11-27 DIAGNOSIS — Z8679 Personal history of other diseases of the circulatory system: Secondary | ICD-10-CM | POA: Diagnosis not present

## 2014-11-27 DIAGNOSIS — I10 Essential (primary) hypertension: Secondary | ICD-10-CM | POA: Diagnosis not present

## 2014-12-13 ENCOUNTER — Telehealth: Payer: Self-pay | Admitting: Neurology

## 2014-12-13 NOTE — Telephone Encounter (Signed)
From what I can find, we have not sent an RX for keppra 500 mg to [facility]. Keppra was changed to 750 mg BID brand name only on 9/29 and sent to [facility]. Jess, please let me know if I can help straighten this out.

## 2014-12-13 NOTE — Telephone Encounter (Signed)
Husband called regarding levETIRAcetam (KEPPRA) 750 MG tablet, [facility] kicked back Rx they received from [place] on the 23rd, because this Rx was for 500 mg, states 750 mg  2 tablets daily, Q 180 tablets , states 750 mg is better.

## 2014-12-13 NOTE — Telephone Encounter (Signed)
As Erasmo Downer indicated, our records show the last Rx sent was for 750mg  tabs one twice daily.  I called the pharmacy ([facility]).  Spoke with [employer].  Said she does not have access to this patient's account because they are handled through a special dept.  I was then transferred to [facility].  He reviewed the file and confirmed they are processing the Rx for 750mg .  States it is too soon to refill at this time, but is in que to auto process in 10 days when it is due.  He does not see any any other issues with this Rx.  I called Mr Kuntz.  Relayed info provided by pharmacy.  He expressed understanding and appreciation.  Says he will call us back if anything further is needed.

## 2015-01-14 DIAGNOSIS — Z1501 Genetic susceptibility to malignant neoplasm of breast: Secondary | ICD-10-CM

## 2015-01-14 HISTORY — DX: Genetic susceptibility to malignant neoplasm of breast: Z15.01

## 2015-03-12 DIAGNOSIS — J3089 Other allergic rhinitis: Secondary | ICD-10-CM | POA: Diagnosis not present

## 2015-03-12 DIAGNOSIS — B349 Viral infection, unspecified: Secondary | ICD-10-CM | POA: Diagnosis not present

## 2015-03-27 DIAGNOSIS — R05 Cough: Secondary | ICD-10-CM | POA: Diagnosis not present

## 2015-04-10 ENCOUNTER — Ambulatory Visit (INDEPENDENT_AMBULATORY_CARE_PROVIDER_SITE_OTHER): Payer: Medicare Other | Admitting: Adult Health

## 2015-04-10 ENCOUNTER — Encounter: Payer: Self-pay | Admitting: Adult Health

## 2015-04-10 VITALS — BP 140/86 | HR 76 | Resp 20 | Ht 64.0 in | Wt 222.0 lb

## 2015-04-10 DIAGNOSIS — R569 Unspecified convulsions: Secondary | ICD-10-CM | POA: Diagnosis not present

## 2015-04-10 NOTE — Progress Notes (Signed)
I agree with the assessment and plan as directed by NP .The patient is known to me .   Jaxsin Bottomley, MD  

## 2015-04-10 NOTE — Patient Instructions (Signed)
Continue Keppra Memory score is stable If your symptoms worsen or you develop new symptoms please let us know.   

## 2015-04-10 NOTE — Progress Notes (Signed)
PATIENT: FERRAH MCCLERNON DOB: [birth year]  REASON FOR VISIT: follow up- seizures  HISTORY FROM: patient  HISTORY OF PRESENT ILLNESS: Ms. Tobey is a 75 year old female with a history of seizures. She returns today for follow-up. She is currently taking Keppra 750 mg twice a day. She reports that she is tolerating this medication well. She denies any new neurological symptoms. She states at home she is able to complete all ADLs independently. She stopped driving after her stroke. 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 She states that at the last visit Dr. Brett Fairy ordered belsomra and gave her samples however she did not start this medication. She states that she has continue using the Klonopin. She reports that she has had some issues breathing and was given albuterol. She states that she is still having some difficulty breathing and her primary care mentioned a sleep study. She denies snoring. She denies any excessive daytime sleepiness. Denies a morning headache. She states that her husband has not witnessed any apnea events. The patient feels that her memory has remained stable. She just returned from traveling to see family members. She does report some numbness in the fingertips on the right hand. Occasionally will drop things in the right hand. However this time she does not want to proceed with any additional testing. She returns today for an evaluation.  HISTORY 10/11/14 [facility]): Patient returned for a yearly follow up. She reports medical problems: a carbuncle with MRSA, diagnosed spring 2016 and treated with ATB ( doxicycline) by Dr. Amy Martinique , Dermatologist . The patient tolerated the oral doxycycline fine. Mrs. Weisberg's cognitive status remains stable we performed a [place] cognitive assessment test today and she scored well on the clock drawing she has a mild tremor which impairs the contour of the clock face, she scored well on this Trail Making Test name  3 animals had some trouble again this tremor on drawing a cube. Was able to do the attention tests but not subtraction. Language and abstraction were tested the patient only had a word fluency test of 9 points. She was able to score 4 out of 5 recall words and was fully oriented to date place and time. She scored 26 out of 30 points, is a HS graduate. 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 No seizures in the interval time.   SAPHRONIA THISTLE is a 75 y.o. female here seen originally many years ago as a referral from Dr.Kevin Little ,  Mrs. Plautz is an established patient of my practice. 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  She also has a history of hypertension, osteopenia, vertigo and some mild hearing loss, classified as vestibulitis in her older records. Surgical history is positive for right foot fracture in May 2010 and 4 surgeries to the ankle. Her ankle was fractured in a seizure related incident 2010,  twisting her foot in the midst of her typical left twisting body movements during a seizure.  [name] brain injury in 2008 has affected her cognitively and she often struggles to find words. 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 She is taking 20 mg daily. She also takes vitamin D against fatigue. Her affect is better controlled and she feels more assured, and now is less socially isolated.  Mrs. Pallett had  been seizure-free for over 12 months by the time of her April 2014 visit, now she reports that she had one seizure around Christmas 2014, while compliant on her current regimen of Keppra and Clonazepam.  She fills her medications at [facility] [address] in [place].Vestibular evaluation for severe vertigo  REVIEW OF SYSTEMS: Out of a complete 14 system review of symptoms, the  patient complains only of the following symptoms, and all other reviewed systems are negative.  Activity change, appetite change, eye itching, eye redness, light sensitivity, heat intolerance, swollen abdomen, abdominal pain, constipation, joint pain, joint swelling, back pain, aching muscles, neck pain, difficulty urinating, ringing in ears, runny nose, drooling  ALLERGIES: Allergies  Allergen Reactions  . Prozac [Fluoxetine Hcl] Other (See Comments)    seizures  . Sulfa Antibiotics Nausea And Vomiting and Other (See Comments)    TINGLING IN LEGS ALSO  . Levaquin [Levofloxacin In D5w] Other (See Comments)    Mood changes? Questionable per patient-IF PATIENTS NEEDS MEDICATION, PREFERS TO USE LEVAQUIN-DUE TO WORKING GREAT FOR PATIENT.   . Micardis [Telmisartan] Swelling and Other (See Comments)    Swelling, bruising  . Prednisone Other (See Comments)    Mood changes  . Augmentin [Amoxicillin-Pot Clavulanate] Rash  . Avelox [Moxifloxacin Hcl In Nacl] Rash    LEVAQUIN IS OK-SEE NOTE  . Norvasc [Amlodipine Besylate] Other (See Comments)    fatigue    HOME MEDICATIONS: Outpatient Prescriptions Prior to Visit  Medication Sig Dispense Refill  . aspirin EC 81 MG tablet Take 81 mg by mouth daily.    Marland Kitchen atenolol (TENORMIN) 50 MG tablet Take 50 mg by mouth 2 (two) times daily.    . captopril (CAPOTEN) 50 MG tablet Take 50 mg by mouth 2 (two) times daily.    . Cholecalciferol (VITAMIN D PO) Take 2,000 Units by mouth. Twice daily    . clonazePAM (KLONOPIN) 2 MG tablet Take 1 tablet (2 mg total) by mouth at bedtime. 90 tablet 1  . fexofenadine (ALLEGRA) 180 MG tablet Take 180 mg by mouth daily.     . fluticasone (FLONASE) 50 MCG/ACT nasal spray Place 2 sprays into the nose daily as needed for allergies. Spray twice daily as needed    . guaiFENesin (MUCINEX) 600 MG 12 hr tablet Take 1,200 mg by mouth 2 (two) times daily.    Marland Kitchen levETIRAcetam (KEPPRA) 750 MG tablet Take 1 tablet (750 mg total) by  mouth 2 (two) times daily. 180 tablet 3  . LEXAPRO 20 MG tablet Take 1 tablet (20 mg total) by mouth daily. Morning dosage (Patient taking differently: Take 20 mg by mouth at bedtime. Morning dosage) 90 tablet 3  . simvastatin (ZOCOR) 10 MG tablet Take 10 mg by mouth at bedtime.    . Suvorexant (BELSOMRA) 10 MG TABS Take 10 mg by mouth Nightly. 10 tablet 0  . triamterene-hydrochlorothiazide (DYAZIDE) 37.5-25 MG per capsule Take 1 capsule by mouth daily. Morning dosage     No facility-administered medications prior to visit.    PAST MEDICAL HISTORY: Past Medical History  Diagnosis Date  . 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 Stroke, hemorrhagic aneurysm bleed.   Marland Kitchen HTN (hypertension)   . Vestibulitis of ear   . Seizures   . Hearing loss   . Memory loss   . Diverticulosis     PAST SURGICAL HISTORY: Past Surgical History  Procedure Laterality Date  . Cerebral aneurysm repair    .  Tonsillectomy      FAMILY HISTORY: Family History  Problem Relation Age of Onset  . Dementia Mother   . Stroke Mother   . Ovarian cancer Paternal Grandmother   . Thyroid disease Mother   . Osteoporosis Mother     SOCIAL HISTORY: Social History   Social History  . Marital Status: Married    Spouse Name: Jenny Reichmann  . Number of Children: 2  . Years of Education: 12   Occupational History  . Not on file.   Social History Main Topics  . Smoking status: Former Research scientist (life sciences)  . Smokeless tobacco: Never Used  . Alcohol Use: No  . Drug Use: No  . Sexual Activity: Not on file   Other Topics Concern  . Not on file   Social History Narrative   Patient is married ([name]) and lives at home with her husband.   Patient is retired.   Patient has two children.   Patient has a high school education.   Patient is right-handed.   Patient drinks 2 big mugs of coffee daily and sometimes tea in the evenings.      PHYSICAL EXAM  Filed Vitals:   04/10/15  1346  BP: 140/86  Pulse: 76  Resp: 20  Height: 5\' 4"  (1.626 m)  Weight: 222 lb (100.699 kg)   Body mass index is 38.09 kg/(m^2).  Generalized: Well developed, in no acute distress   Neurological examination  Mentation: Alert oriented to time, place, history taking. Follows all commands speech and language fluent.MOCA 27/30 Cranial nerve II-XII: Pupils were equal round reactive to light. Extraocular movements were full, visual field were full on confrontational test. Facial sensation and strength were normal. Uvula tongue midline. Head turning and shoulder shrug  were normal and symmetric. Motor: The motor testing reveals 5 over 5 strength of all 4 extremities. Good symmetric motor tone is noted throughout.  Sensory: Sensory testing is intact to soft touch on all 4 extremities. No evidence of extinction is noted.  Coordination: Cerebellar testing reveals good finger-nose-finger and heel-to-shin bilaterally.  Gait and station: Gait is slightly unstable. Tandem gait not attempted. Reflexes: Deep tendon reflexes are symmetric and normal bilaterally.   DIAGNOSTIC DATA (LABS, IMAGING, TESTING) - I reviewed patient records, labs, notes, testing and imaging myself where available.  Lab Results  Component Value Date   WBC 14.1* 08/18/2014   HGB 15.2* 08/18/2014   HCT 42.7 08/18/2014   MCV 93.0 08/18/2014   PLT 251 08/18/2014      Component Value Date/Time   NA 131* 08/18/2014 1522   K 3.5 08/18/2014 1522   CL 97* 08/18/2014 1522   CO2 24 08/18/2014 1522   GLUCOSE 105* 08/18/2014 1522   BUN 7 08/18/2014 1522   CREATININE 0.87 08/18/2014 1522   CALCIUM 9.2 08/18/2014 1522   PROT 7.1 08/18/2014 1522   ALBUMIN 3.7 08/18/2014 1522   AST 22 08/18/2014 1522   ALT 19 08/18/2014 1522   ALKPHOS 46 08/18/2014 1522   BILITOT 0.6 08/18/2014 1522   GFRNONAA >60 08/18/2014 1522   GFRAA >60 08/18/2014 1522    ASSESSMENT AND PLAN 75 y.o. year old female  has a past medical history of  Seizure disorder, complex partial, 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seizure free for one year); Stroke, hemorrhagic (aneurysm bleed. ); HTN (hypertension); Vestibulitis of ear; Seizures; Hearing loss; Memory loss; and Diverticulosis. here with:  1. Seizure disorder  Overall the patient is doing well. She has not had any additional seizure events. She will  continue on Keppra 750 mg twice a day. Her memory score has remained stable. We will continue to monitor. The patient does not have any signs and symptoms of sleep apnea. We will continue to monitor. Patient will follow-up in 6 months or sooner if needed.  I spent 25 minutes with the patient 50% of this time was spent counseling the patient on her diagnosis and treatment.     Ward Givens, MSN, NP-C 04/10/2015, 1:48 PM [facility] [address], [place], [place] [address] [phone]

## 2015-05-18 DIAGNOSIS — J019 Acute sinusitis, unspecified: Secondary | ICD-10-CM | POA: Diagnosis not present

## 2015-05-18 DIAGNOSIS — J209 Acute bronchitis, unspecified: Secondary | ICD-10-CM | POA: Diagnosis not present

## 2015-05-28 ENCOUNTER — Encounter: Payer: Self-pay | Admitting: Obstetrics and Gynecology

## 2015-05-28 ENCOUNTER — Ambulatory Visit: Payer: Medicare Other | Admitting: Obstetrics and Gynecology

## 2015-05-28 DIAGNOSIS — I10 Essential (primary) hypertension: Secondary | ICD-10-CM | POA: Diagnosis not present

## 2015-05-28 DIAGNOSIS — R569 Unspecified convulsions: Secondary | ICD-10-CM | POA: Diagnosis not present

## 2015-05-28 DIAGNOSIS — Z8679 Personal history of other diseases of the circulatory system: Secondary | ICD-10-CM | POA: Diagnosis not present

## 2015-05-28 DIAGNOSIS — Z6839 Body mass index (BMI) 39.0-39.9, adult: Secondary | ICD-10-CM | POA: Diagnosis not present

## 2015-05-28 DIAGNOSIS — E784 Other hyperlipidemia: Secondary | ICD-10-CM | POA: Diagnosis not present

## 2015-05-28 DIAGNOSIS — F329 Major depressive disorder, single episode, unspecified: Secondary | ICD-10-CM | POA: Diagnosis not present

## 2015-05-28 DIAGNOSIS — E6609 Other obesity due to excess calories: Secondary | ICD-10-CM | POA: Diagnosis not present

## 2015-05-28 DIAGNOSIS — R7301 Impaired fasting glucose: Secondary | ICD-10-CM | POA: Diagnosis not present

## 2015-05-28 DIAGNOSIS — J309 Allergic rhinitis, unspecified: Secondary | ICD-10-CM | POA: Diagnosis not present

## 2015-05-30 DIAGNOSIS — Z1231 Encounter for screening mammogram for malignant neoplasm of breast: Secondary | ICD-10-CM | POA: Diagnosis not present

## 2015-05-30 DIAGNOSIS — Z803 Family history of malignant neoplasm of breast: Secondary | ICD-10-CM | POA: Diagnosis not present

## 2015-05-31 ENCOUNTER — Encounter: Payer: Self-pay | Admitting: Obstetrics and Gynecology

## 2015-05-31 ENCOUNTER — Ambulatory Visit (INDEPENDENT_AMBULATORY_CARE_PROVIDER_SITE_OTHER): Payer: Medicare Other | Admitting: Obstetrics and Gynecology

## 2015-05-31 VITALS — BP 142/82 | HR 66 | Resp 16 | Ht 63.5 in | Wt 228.0 lb

## 2015-05-31 DIAGNOSIS — Z8041 Family history of malignant neoplasm of ovary: Secondary | ICD-10-CM | POA: Diagnosis not present

## 2015-05-31 DIAGNOSIS — Z01419 Encounter for gynecological examination (general) (routine) without abnormal findings: Secondary | ICD-10-CM | POA: Diagnosis not present

## 2015-05-31 DIAGNOSIS — Z124 Encounter for screening for malignant neoplasm of cervix: Secondary | ICD-10-CM | POA: Diagnosis not present

## 2015-05-31 DIAGNOSIS — N816 Rectocele: Secondary | ICD-10-CM

## 2015-05-31 DIAGNOSIS — Z803 Family history of malignant neoplasm of breast: Secondary | ICD-10-CM

## 2015-05-31 DIAGNOSIS — N393 Stress incontinence (female) (male): Secondary | ICD-10-CM

## 2015-05-31 DIAGNOSIS — Z8481 Family history of carrier of genetic disease: Secondary | ICD-10-CM | POA: Diagnosis not present

## 2015-05-31 NOTE — Progress Notes (Signed)
Patient ID: Beth [name], female   DOB: 17-Jun-1940, 75 y.o.   MRN: [id number] 75 y.o. G27P2002 Married Caucasian female here for annual exam.    Here with several problems today for which she would like discussion.  1 - Having vaginal prolapse and burning feeling.  Some dryness. Some urinary leakage with cough and sneeze and spontaneously.  Some difficulty emptying bladder well.   2 - Also has fecal incontinence, but this is not an important issue for her.  Using glycerin suppositories nightly.  Has some constipation issues also. Does dietary measures with increased vegetables and water.  Also eats yogurt.  Stool softeners are not as helpful. Stool seems like it gets caught.  3 - FH of BRCA2 in daughter.  Patient has not had testing and not sure if she wants it.  Desires pap today.   No vaginal bleeding.   Takes a diuretic.   Hx rectal bleeding last year.  Had CT scan and was dx with diverticulitis.   PCP:  Kathyrn Lass, MD    Patient's last menstrual period was 01/14/1995 (approximate).           Sexually active: No. female The current method of family planning is post menopausal status.    Exercising: No.   Smoker:  Former  Health Maintenance: Pap:  2007 Neg History of abnormal Pap:  no MMG:  05-30-15 with [facility].  Normal - BI-RADS1, Density rating A. Colonoscopy:  09/2014 normal [facility];does not need any further colonoscopy BMD:    2016 Result: normal: [facility] TDaP:  PCP Gardasil:   N/A Hep C:  N/A Screening Labs:  Hb today: PCP, Urine today: PCP  reports that she quit smoking about 14 years ago. She has never used smokeless tobacco. She reports that she does not drink alcohol or use illicit drugs.  Past Medical History  Diagnosis Date  . Seizure disorder, complex partial, 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[place]) seizure free for one year  . Stroke, hemorrhagic ([place]) aneurysm bleed.   Marland Kitchen HTN (hypertension)   . Vestibulitis of ear   . Seizures ([place])   . Hearing loss    . Memory loss   . Diverticulosis     Past Surgical History  Procedure Laterality Date  . Cerebral aneurysm repair    . Tonsillectomy    . Broken ankle Right     Current Outpatient Prescriptions  Medication Sig Dispense Refill  . albuterol (PROVENTIL HFA;VENTOLIN HFA) 108 (90 Base) MCG/ACT inhaler Inhale 2 puffs into the lungs every 4 (four) hours as needed for wheezing or shortness of breath.    Marland Kitchen aspirin EC 81 MG tablet Take 81 mg by mouth daily.    Marland Kitchen atenolol (TENORMIN) 50 MG tablet Take 50 mg by mouth 2 (two) times daily.    . captopril (CAPOTEN) 50 MG tablet Take 50 mg by mouth 2 (two) times daily.    . Cholecalciferol (VITAMIN D PO) Take 2,000 Units by mouth. Twice daily    . clonazePAM (KLONOPIN) 2 MG tablet Take 1 tablet (2 mg total) by mouth at bedtime. 90 tablet 1  . fexofenadine (ALLEGRA) 180 MG tablet Take 180 mg by mouth daily.     . fluticasone (FLONASE) 50 MCG/ACT nasal spray Place 2 sprays into the nose daily as needed for allergies. Spray twice daily as needed    . guaiFENesin (MUCINEX) 600 MG 12 hr tablet Take 1,200 mg by mouth 2 (two) times daily.    Marland Kitchen levETIRAcetam (KEPPRA) 750 MG  tablet Take 1 tablet (750 mg total) by mouth 2 (two) times daily. 180 tablet 3  . LEXAPRO 20 MG tablet Take 1 tablet (20 mg total) by mouth daily. Morning dosage (Patient taking differently: Take 20 mg by mouth at bedtime. Morning dosage) 90 tablet 3  . simvastatin (ZOCOR) 10 MG tablet Take 10 mg by mouth at bedtime.    . triamterene-hydrochlorothiazide (DYAZIDE) 37.5-25 MG per capsule Take 1 capsule by mouth daily. Morning dosage     No current facility-administered medications for this visit.    Family History  Problem Relation Age of Onset  . Dementia Mother   . Stroke Mother   . Thyroid disease Mother   . Osteoporosis Mother   . Ovarian cancer Paternal Grandmother   . Ovarian cancer Paternal Aunt   . Breast cancer Daughter     breast ca x2--estrogen  . Other Daughter      BRCA 2 Pos.    ROS:  Pertinent items are noted in HPI.  Otherwise, a comprehensive ROS was negative.  Exam:   BP 142/82 mmHg  Pulse 66  Resp 16  Ht 5' 3.5" (1.613 m)  Wt 228 lb (103.42 kg)  BMI 39.75 kg/m2  LMP 01/14/1995 (Approximate)    General appearance: alert, cooperative and appears stated age Head: Normocephalic, without obvious abnormality, atraumatic Neck: no adenopathy, supple, symmetrical, trachea midline and thyroid normal to inspection and palpation Lungs: clear to auscultation bilaterally Breasts: normal appearance, no masses or tenderness, Inspection negative, No nipple retraction or dimpling, No nipple discharge or bleeding, No axillary or supraclavicular adenopathy Heart: regular rate and rhythm Abdomen: incisions:  No.    , soft, non-tender; no masses, no organomegaly Extremities: extremities normal, atraumatic, no cyanosis or edema Skin: Skin color, texture, turgor normal. No rashes or lesions Lymph nodes: Cervical, supraclavicular, and axillary nodes normal. No abnormal inguinal nodes palpated Neurologic: Grossly normal  Pelvic: External genitalia:  no lesions              Urethra:  normal appearing urethra with no masses, tenderness or lesions              Bartholins and Skenes: normal                 Vagina: normal appearing vagina with normal color and discharge, no lesions.  Cyst to the left of external hymen - blue 7 mm nontender.  Almost third degree rectocele.  Minimal cystocele and uterine prolapse.              Cervix: no lesions              Pap taken: Yes.   Bimanual Exam:  Uterus:  normal size, contour, position, consistency, mobility, non-tender              Adnexa: no mass, fullness, tenderness              Rectal exam: Yes.  .  Confirms.              Anus:  normal sphincter tone, no lesions  Chaperone was present for exam.  Assessment:   Well woman visit. Genuine stress incontinence. Rectocele.  Constipation and fecal incontinence. FH of  breast and ovarian cancer.  Daughter is BRCA 2 positive. Patient is untested to date. Hx of seizure disorder secondary to stroke from aneurysm.  Plan: Yearly mammogram recommended after age 87.  Recommended self breast exam.  Pap and HR HPV as above. Discussed Calcium, Vitamin  D, regular exercise program including cardiovascular and weight bearing exercise. Labs performed.  No..   See orders. Prescription medication(s) given.  No..  See orders. Discussion of incontinence and prolapse and tx options - observation, PT, pessary, surgery.  Patient chooses to return for pessary.  Discussion of BRCA genetic mutation, genetic counseling, and genetic testing.  Patient will consider.  Follow up annually and prn.   Additional counseling given.  Yes.  . ___15____ minutes face to face time of which over 50% was spent in counseling regarding prolapse, incontinence, and BRCA genetic mutation.    After visit summary provided.

## 2015-05-31 NOTE — Patient Instructions (Signed)

## 2015-06-04 ENCOUNTER — Encounter: Payer: Self-pay | Admitting: Obstetrics and Gynecology

## 2015-06-04 ENCOUNTER — Ambulatory Visit (INDEPENDENT_AMBULATORY_CARE_PROVIDER_SITE_OTHER): Payer: Medicare Other | Admitting: Obstetrics and Gynecology

## 2015-06-04 VITALS — BP 124/70 | HR 64 | Resp 16 | Ht 63.5 in | Wt 229.0 lb

## 2015-06-04 DIAGNOSIS — N816 Rectocele: Secondary | ICD-10-CM

## 2015-06-04 DIAGNOSIS — Z803 Family history of malignant neoplasm of breast: Secondary | ICD-10-CM

## 2015-06-04 DIAGNOSIS — N907 Vulvar cyst: Secondary | ICD-10-CM

## 2015-06-04 DIAGNOSIS — N393 Stress incontinence (female) (male): Secondary | ICD-10-CM

## 2015-06-04 LAB — IPS PAP SMEAR ONLY

## 2015-06-04 NOTE — Progress Notes (Signed)
GYNECOLOGY  VISIT   HPI: 75 y.o.   Married  Caucasian  female   G2P2002 with Patient's last menstrual period was 01/14/1995 (approximate).   here for   Pessary fitting.  Is about to start tx for yeast vaginitis from taking a Z pack.  Having burning and discharge.   Some leakage with cough and sneeze.   Considering genetic testing for BRCA2.  Daughter is positive and has breast cancer dx.  GYNECOLOGIC HISTORY: Patient's last menstrual period was 01/14/1995 (approximate). Contraception:  Post menopausal status Last mammogram:  05-30-15 with [facility]. Normal - BI-RADS1, Density rating A. Last pap smear:   05/31/15 see EPIC        OB History    Gravida Para Term Preterm AB TAB SAB Ectopic Multiple Living   _0 Patient Active Problem List   Diagnosis Date Noted  . 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 Benign paroxysmal positional vertigo 04/06/2014  . 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 Pulsatile tinnitus of right ear 04/06/2014  . Other forms of epilepsy and recurrent seizures without mention of intractable epilepsy 04/21/2013  . SAH (subarachnoid hemorrhage) ([place]) 04/21/2013  . 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 Seizure disorder, complex partial, 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[place])   . Stroke, hemorrhagic ([place])   . HTN (hypertension)   . Vestibulitis of ear     Past Medical History  Diagnosis Date  . Seizure disorder, complex partial, 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[place]) seizure free for one year  . Stroke, hemorrhagic ([place]) aneurysm bleed.   Marland Kitchen HTN (hypertension)   . Vestibulitis of ear   . Seizures ([place])   . Hearing loss   . Memory loss   . Diverticulosis     Past Surgical History  Procedure Laterality Date  . Cerebral aneurysm repair    . Tonsillectomy    . Broken ankle Right     Current Outpatient Prescriptions  Medication Sig Dispense Refill  . albuterol (PROVENTIL HFA;VENTOLIN HFA) 108 (90 Base) MCG/ACT inhaler Inhale 2  puffs into the lungs every 4 (four) hours as needed for wheezing or shortness of breath.    Marland Kitchen aspirin EC 81 MG tablet Take 81 mg by mouth daily.    Marland Kitchen atenolol (TENORMIN) 50 MG tablet Take 50 mg by mouth 2 (two) times daily.    . captopril (CAPOTEN) 50 MG tablet Take 50 mg by mouth 2 (two) times daily.    . Cholecalciferol (VITAMIN D PO) Take 2,000 Units by mouth. Twice daily    . clonazePAM (KLONOPIN) 2 MG tablet Take 1 tablet (2 mg total) by mouth at bedtime. 90 tablet 1  . fexofenadine (ALLEGRA) 180 MG tablet Take 180 mg by mouth daily.     . fluticasone (FLONASE) 50 MCG/ACT nasal spray Place 2 sprays into the nose daily as needed for allergies. Spray twice daily as needed    . guaiFENesin (MUCINEX) 600 MG 12 hr tablet Take 1,200 mg by mouth 2 (two) times daily.    Marland Kitchen levETIRAcetam (KEPPRA) 750 MG tablet Take 1 tablet (750 mg total) by mouth 2 (two) times daily. 180 tablet 3  . LEXAPRO 20 MG tablet Take 1 tablet (20 mg total) by mouth daily. Morning dosage (Patient taking differently: Take 20 mg by mouth at bedtime. Morning dosage) 90 tablet 3  . simvastatin (ZOCOR) 10 MG tablet Take 10 mg by mouth at bedtime.    Marland Kitchen  triamterene-hydrochlorothiazide (DYAZIDE) 37.5-25 MG per capsule Take 1 capsule by mouth daily. Morning dosage     No current facility-administered medications for this visit.     ALLERGIES: Prozac; Sulfa antibiotics; Levaquin; Micardis; Prednisone; Augmentin; Avelox; and Norvasc  Family History  Problem Relation Age of Onset  . Dementia Mother   . Stroke Mother   . Thyroid disease Mother   . Osteoporosis Mother   . Ovarian cancer Paternal Grandmother   . Ovarian cancer Paternal Aunt   . Breast cancer Daughter     breast ca x2--estrogen  . Other Daughter     BRCA 2 Pos.    Social History   Social History  . Marital Status: Married    Spouse Name: [name]  . Number of Children: 2  . Years of Education: 12   Occupational History  . Not on file.   Social History Main  Topics  . Smoking status: Former Smoker    Quit date: 01/13/2001  . Smokeless tobacco: Never Used  . Alcohol Use: No  . Drug Use: No  . Sexual Activity: No   Other Topics Concern  . Not on file   Social History Narrative   Patient is married ([name]) and lives at home with her husband.   Patient is retired.   Patient has two children.   Patient has a high school education.   Patient is right-handed.   Patient drinks 2 big mugs of coffee daily and sometimes tea in the evenings.    ROS:  Pertinent items are noted in HPI.  PHYSICAL EXAMINATION:    LMP 01/14/1995 (Approximate)    General appearance: alert, cooperative and appears stated age   Pelvic: External genitalia:  5 mm cyst just outside the left hymen.  Not tender. No drainage.              Urethra:  normal appearing urethra with no masses, tenderness or lesions              Bartholins and Skenes: normal                 Vagina: normal appearing vagina with normal color and discharge, no lesions        Bimanual Exam:  Uterus:  normal size, contour, position, consistency, mobility, non-tender              Adnexa: normal adnexa and no mass, fullness, tenderness              Bimanual exam limited by BH.  Pessary fitting - #2 ring with support fitted and patient about to do maneuvers and maintain the pessary.  She does state that it burns slightly. (I think that this is from the yeast infection.)  Chaperone was present for exam.  ASSESSMENT  Rectocele.  Genuine stress incontinence. FH of breast cancer and ovarian cancer.  Positive BRCA2 positive status in daughter. Hymenal cyst.   PLAN  Take Gynelotrimin for yeast vaginitis.  Offered pessary #2 ring with support.  Patient will proceed first with genetic counseling and testing for BRCA.  If she is positive, she would like to proceed forward with surgery for prolapse and incontinence and have ovaries/tubes removed.  Hyenal/vulvar cyst can be removed at the same time.   Would need a pelvic ultrasound and CA125 prior to surgical care.     An After Visit Summary was printed and given to the patient.  __25____ minutes face to face time of which over 50% was spent in counseling.     

## 2015-06-14 ENCOUNTER — Encounter: Payer: Self-pay | Admitting: Genetic Counselor

## 2015-06-22 ENCOUNTER — Telehealth: Payer: Self-pay | Admitting: Genetic Counselor

## 2015-06-22 NOTE — Telephone Encounter (Signed)
Returned pts call regarding genetic test and insurance questions.  Pt confirmed appt

## 2015-07-24 ENCOUNTER — Other Ambulatory Visit: Payer: Medicare Other

## 2015-07-24 ENCOUNTER — Ambulatory Visit (HOSPITAL_BASED_OUTPATIENT_CLINIC_OR_DEPARTMENT_OTHER): Payer: Medicare Other | Admitting: Genetic Counselor

## 2015-07-24 DIAGNOSIS — Z8041 Family history of malignant neoplasm of ovary: Secondary | ICD-10-CM

## 2015-07-24 DIAGNOSIS — Z800 Family history of malignant neoplasm of digestive organs: Secondary | ICD-10-CM | POA: Diagnosis not present

## 2015-07-24 DIAGNOSIS — Z803 Family history of malignant neoplasm of breast: Secondary | ICD-10-CM | POA: Diagnosis not present

## 2015-07-24 DIAGNOSIS — Z8481 Family history of carrier of genetic disease: Secondary | ICD-10-CM

## 2015-07-24 DIAGNOSIS — Z315 Encounter for genetic counseling: Secondary | ICD-10-CM

## 2015-07-26 ENCOUNTER — Encounter: Payer: Self-pay | Admitting: Genetic Counselor

## 2015-07-26 DIAGNOSIS — Z8041 Family history of malignant neoplasm of ovary: Secondary | ICD-10-CM | POA: Insufficient documentation

## 2015-07-26 NOTE — Progress Notes (Signed)
REFERRING PROVIDER: Aundria Rud, MD  PRIMARY PROVIDER:  Tawanna Solo, MD  PRIMARY REASON FOR VISIT:  1. Family history of BRCA2 gene positive   2. Family history of ovarian cancer   3. Family history of breast cancer in first degree relative   4. Family history of colon cancer      HISTORY OF PRESENT ILLNESS:   Beth [name], a 75 y.o. female, was seen for a [place] cancer genetics consultation at the request of Beth [name] due to a family history of a known pathogenic BRCA2 mutation, identified in her daughter, Beth [name], and family history of breast and ovarian cancers.  Beth [name] presents to clinic today with Beth [name] to discuss the possibility of a hereditary predisposition to cancer, genetic testing, and to further clarify her future cancer risks, as well as potential cancer risks for family members.    Beth [name] is a 75 y.o. female with no personal history of cancer.     HORMONAL RISK FACTORS:  Menarche was at age 12.  First live birth at age 40.  OCP use for approximately 5 years or less.  Ovaries intact: yes.  Hysterectomy: no.  Menopausal status: postmenopausal.  HRT use: briefly. Colonoscopy: yes; history of approx 3 hyperplastic polyps. Mammogram within the last year: yes. Number of breast biopsies: 0. Up to date with pelvic exams:  yes. Any excessive radiation exposure in the past:  no  Past Medical History  Diagnosis Date  . Seizure disorder, complex partial, 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Beth [name]) seizure free for one year  . Stroke, hemorrhagic (Beth [name]) aneurysm bleed.   Marland Kitchen HTN (hypertension)   . Vestibulitis of ear   . Seizures (Beth [name])   . Hearing loss   . Memory loss   . Diverticulosis     Past Surgical History  Procedure Laterality Date  . Cerebral aneurysm repair    . Tonsillectomy    . Broken ankle Right     Social History   Social History  . Marital Status: Married    Spouse Name: Beth [name]  . Number of Children: 2  . Years of Education: 12    Social History Main Topics  . Smoking status: Former Smoker -- 1.00 packs/day for 41 years    Quit date: 01/13/2001  . Smokeless tobacco: Never Used  . Alcohol Use: No  . Drug Use: No  . Sexual Activity: No   Other Topics Concern  . None   Social History Narrative   Patient is married (Beth [name]) and lives at home with her husband.   Patient is retired.   Patient has two children.   Patient has a high school education.   Patient is right-handed.   Patient drinks 2 big mugs of coffee daily and sometimes tea in the evenings.     FAMILY HISTORY:  We obtained a detailed, 4-generation family history.  Significant diagnoses are listed below: Family History  Problem Relation Age of Onset  . Dementia Mother   . Stroke Mother   . Thyroid disease Mother   . Osteoporosis Mother   . Ovarian cancer Paternal Grandmother 25  . Ovarian cancer Paternal Aunt 45  . Breast cancer Daughter 42    breast ca x2, bilateral dx. 38, 46--estrogen  . Other Daughter     no genetic testing as of 07/2015; has had ovaries removed  . Other Daughter     BRCA 2 Pos.  . Colon cancer Maternal Aunt     dx. unspecified age  .  Emphysema Maternal Uncle   . High blood pressure Maternal Grandmother   . Dementia Maternal Grandfather   . Bleeding Disorder Maternal Grandfather   . Emphysema Maternal Aunt   . Emphysema Maternal Aunt     Beth [name] has two daughters--Beth [name] is 68 and Beth [name] is 50.  Beth [name] has a history of bilateral breast cancers, diagnosed at ages 30 and 2.  She also had an oophorectomy, but she has not yet had genetic testing.  Beth [name] has no children.  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  Beth [name] has not had cancer; she plans to have a surgery to remove her ovaries and fallopian tubes soon.  Beth [name] has one son who is currently 42.  Beth [name] has three maternal half-sisters and one maternal half-brother,  all of whom are in their 60s-early 53s.  None of these siblings have had cancer.  Beth [name] mother died at 81 and never had a history of cancer.  Beth [name] father died in his late 54s and also never had cancer.    Beth [name] mother had four full sisters and three full brothers.  Only one sister is still living, and she has a history of colon cancer at an unspecified age.  The other siblings have all passed away, some in their late 40s-50s due to emphysema.  Beth [name] reports no known history of cancer in any of her maternal first cousins.  Her maternal grandmother died of high blood pressure in her late 57s.  Her grandfather died of dementia in his early-mid-90s.  Beth [name] has no information for her maternal great aunts/uncles or great grandparents.    Beth [name] father had one full sister and four full brothers.  His sister died of ovarian cancer at 43.  Beth [name] has limited information for her uncles and paternal first cousins.  Her paternal grandmother died of ovarian cancer at 47.  She has no information for her paternal grandfather or any other paternal relatives.  Patient's maternal ancestors are of [place] and [place] descent, and paternal ancestors are of Caucasian descent. There is no reported Ashkenazi Jewish ancestry. There is no known consanguinity.  GENETIC COUNSELING ASSESSMENT: Beth [name] is a 75 y.o. female with a family history of a pathogenic BRCA2 mutation and family history of ovarian cancer which is somewhat suggestive of hereditary breast and ovarian cancer syndrome and predisposition to cancer. We, therefore, discussed and recommended the following at today's visit.   DISCUSSION: We reviewed the characteristics, features and inheritance pattern of hereditary breast and ovarian cancer syndrome. We also discussed genetic testing, including the appropriate family members to test, the process of testing, insurance coverage and turn-around-time for results.  We discussed the implications of a negative, positive and/or variant of uncertain significant result. Beth [name] is interested in further analysis of additional breast/ovarian cancer-related genes, in addition to testing for the known familial BRCA2 mutation.  Thus, we recommended Beth [name] pursue genetic testing for the 30-gene Hereditary Cancer Panel through [facility].  The 30-gene Panel through [facility] [place], [place]) includes sequencing and/or deletion/duplication analysis of the following genes: APC, ATM, BAP1, BARD1, BMPR1A, BRCA1, BRCA2, BRIP1, CDH1, CDK4, CDKN2A, CHEK2, EPCAM, GREM1, MITF, MLH1, MSH2, MSH6, MUTYH, NBN, PALB2, PMS2, POLD1, POLE, PTEN, RAD51C, RAD51D, SMAD4, STK11, and TP53.   Testing through [facility] is a self-pay option, so Beth [name]'s insurance will not be billed.  She is  eligible for [facility]'s Family Testing Program due to having a first-degree relative with a positive genetic test result.  Thus, her cost for testing is $60.  Ms. Tally provided her consent for testing and provided credit card payment.  She is taking the test kit home from the lab with her and will collect a saliva sample at home and send it off through the mail to [facility].    PLAN: After considering the risks, benefits, and limitations, Beth [name]  provided informed consent to pursue genetic testing and a saliva sample will be sent to [facility] for analysis of the 30-gene Hereditary Cancer Panel. Results should be available within approximately 3 weeks' time, at which point they will be disclosed by telephone to Ms. Vancamp, as will any additional recommendations warranted by these results. Ms. Athens will receive a summary of her genetic counseling visit and a copy of her results once available. This information will also be available in Epic. We encouraged Ms. Korson to remain in contact with cancer genetics annually  so that we can continuously update the family history and inform her of any changes in cancer genetics and testing that may be of benefit for her family. Ms. Moyd questions were answered to her satisfaction today. Our contact information was provided should additional questions or concerns arise.  Thank you for the referral and allowing [place] to share in the care of your patient.   Jeanine Luz, MS, [facility] Certified Genetic Counselor [email or web address] Phone: [phone]  The patient was seen for a total of 75 minutes in face-to-face genetic counseling.  This patient was discussed with Drs. Magrinat, Lindi Adie and/or Burr Medico who agrees with the above.    _______________________________________________________________________ For Office Staff:  Number of people involved in session: 3 Was an Intern/ student involved with case: yes

## 2015-07-30 ENCOUNTER — Emergency Department (HOSPITAL_COMMUNITY): Payer: Medicare Other

## 2015-07-30 ENCOUNTER — Encounter (HOSPITAL_COMMUNITY): Payer: Self-pay | Admitting: Emergency Medicine

## 2015-07-30 DIAGNOSIS — Z79899 Other long term (current) drug therapy: Secondary | ICD-10-CM | POA: Diagnosis not present

## 2015-07-30 DIAGNOSIS — I10 Essential (primary) hypertension: Secondary | ICD-10-CM | POA: Insufficient documentation

## 2015-07-30 DIAGNOSIS — R0602 Shortness of breath: Secondary | ICD-10-CM | POA: Diagnosis not present

## 2015-07-30 DIAGNOSIS — R600 Localized edema: Secondary | ICD-10-CM | POA: Insufficient documentation

## 2015-07-30 DIAGNOSIS — Z7982 Long term (current) use of aspirin: Secondary | ICD-10-CM | POA: Insufficient documentation

## 2015-07-30 DIAGNOSIS — Z87891 Personal history of nicotine dependence: Secondary | ICD-10-CM | POA: Diagnosis not present

## 2015-07-30 DIAGNOSIS — R002 Palpitations: Secondary | ICD-10-CM | POA: Diagnosis not present

## 2015-07-30 DIAGNOSIS — R0600 Dyspnea, unspecified: Secondary | ICD-10-CM | POA: Diagnosis not present

## 2015-07-30 LAB — BASIC METABOLIC PANEL
ANION GAP: 10 (ref 5–15)
BUN: 11 mg/dL (ref 6–20)
CHLORIDE: 97 mmol/L — AB (ref 101–111)
CO2: 27 mmol/L (ref 22–32)
Calcium: 9.3 mg/dL (ref 8.9–10.3)
Creatinine, Ser: 0.8 mg/dL (ref 0.44–1.00)
GFR calc Af Amer: >60 mL/min (ref >60)
Glucose, Bld: 176 mg/dL — ABNORMAL HIGH (ref 65–99)
POTASSIUM: 3.3 mmol/L — AB (ref 3.5–5.1)
SODIUM: 134 mmol/L — AB (ref 135–145)

## 2015-07-30 LAB — CBC
HEMATOCRIT: 43 % (ref 36.0–46.0)
HEMOGLOBIN: 14.7 g/dL (ref 12.0–15.0)
MCH: 32.6 pg (ref 26.0–34.0)
MCHC: 34.2 g/dL (ref 30.0–36.0)
MCV: 95.3 fL (ref 78.0–100.0)
Platelets: 247 10*3/uL (ref 150–400)
RBC: 4.51 MIL/uL (ref 3.87–5.11)
RDW: 13 % (ref 11.5–15.5)
WBC: 9.1 10*3/uL (ref 4.0–10.5)

## 2015-07-30 LAB — I-STAT TROPONIN, ED: Troponin i, poc: 0 ng/mL (ref 0.00–0.08)

## 2015-07-30 NOTE — ED Notes (Signed)
Pt reports 30 days of feeling sob and bilateral leg swelling that has been getting worse over the last week. Pt has +1 pitting edema to lower legs. Pt states the sob has been waking her up at night. [facility] increased sob. Pt has labored breathing but able to speak in complete sentences.

## 2015-07-31 ENCOUNTER — Emergency Department (HOSPITAL_COMMUNITY)
Admission: EM | Admit: 2015-07-31 | Discharge: 2015-07-31 | Disposition: A | Discharge status: Home / Self Care | Payer: Medicare Other | Attending: Emergency Medicine | Admitting: Emergency Medicine

## 2015-07-31 DIAGNOSIS — R0602 Shortness of breath: Secondary | ICD-10-CM | POA: Diagnosis not present

## 2015-07-31 DIAGNOSIS — R600 Localized edema: Secondary | ICD-10-CM

## 2015-07-31 LAB — I-STAT TROPONIN, ED: Troponin i, poc: 0 ng/mL (ref 0.00–0.08)

## 2015-07-31 LAB — D-DIMER, QUANTITATIVE: D-Dimer, Quant: 0.54 ug/mL-FEU — ABNORMAL HIGH (ref 0.00–0.50)

## 2015-07-31 LAB — BRAIN NATRIURETIC PEPTIDE: B NATRIURETIC PEPTIDE 5: 24.5 pg/mL (ref 0.0–100.0)

## 2015-07-31 NOTE — ED Notes (Signed)
Pt 92% on room air. Pt placed on nasal cannula 02 2L and o2 saturation increased to 95%. RR even and unlabored at this time.

## 2015-07-31 NOTE — Discharge Instructions (Signed)
Please follow up with your primary care provider for further evaluation of your shortness of breath, including consideration of a sleep study (to evaluate for sleep apnea), a heart monitor, and/or a stress test of your heart.   Peripheral Edema You have swelling in your legs (peripheral edema). This swelling is due to excess accumulation of salt and water in your body. Edema may be a sign of heart, kidney or liver disease, or a side effect of a medication. It may also be due to problems in the leg veins. Elevating your legs and using special support stockings may be very helpful, if the cause of the swelling is due to poor venous circulation. Avoid long periods of standing, whatever the cause. Treatment of edema depends on identifying the cause. Chips, pretzels, pickles and other salty foods should be avoided. Restricting salt in your diet is almost always needed. Water pills (diuretics) are often used to remove the excess salt and water from your body via urine. These medicines prevent the kidney from reabsorbing sodium. This increases urine flow. Diuretic treatment may also result in lowering of potassium levels in your body. Potassium supplements may be needed if you have to use diuretics daily. Daily weights can help you keep track of your progress in clearing your edema. You should call your caregiver for follow up care as recommended. SEEK IMMEDIATE MEDICAL CARE IF:   You have increased swelling, pain, redness, or heat in your legs.  You develop shortness of breath, especially when lying down.  You develop chest or abdominal pain, weakness, or fainting.  You have a fever.   This information is not intended to replace advice given to you by your health care provider. Make sure you discuss any questions you have with your health care provider.   Document Released: 02/07/2004 Document Revised: 03/24/2011 Document Reviewed: 07/12/2014 Elsevier Interactive Patient Education [employer].

## 2015-07-31 NOTE — ED Provider Notes (Signed)
CSN: [id number]     Arrival date & time 07/30/15  2043 History  By signing my name below, I, Beth [name], attest that this documentation has been prepared under the direction and in the presence of Sharlett Iles, MD. Electronically Signed: Altamease [name], ED Scribe. 07/31/2015. 1:33 AM    Chief Complaint  Patient presents with  . Shortness of Breath   The history is provided by the patient. No language interpreter was used.   Beth [name] is a 75 y.o. female with PMHx of HTN, hemorrhagic stroke, and seizures who presents to the Emergency Department complaining of progressively worsening and intermittent SOB with onset 1 month ago. Pt states that she has had infrequent SOB for years with anxiety and exertion but has had daily SOB for the last month. She is unable to identify a trigger for the SOB and notes that it happens regardless of exertion.  Associated symptoms include palpitations and bilateral leg swelling. Pt denies new cough, fever, chest pain, and unexpected weight change. Pt was seen at an outside clinic today and referred to the ED for an abnormal EKG. She has had no recent travel. No personal history of cardiac disease, DVT/PE, or cancer.  Past Medical History  Diagnosis Date  . Seizure disorder, complex partial, 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[place]) seizure free for one year  . Stroke, hemorrhagic ([place]) aneurysm bleed.   Marland Kitchen HTN (hypertension)   . Vestibulitis of ear   . Seizures ([place])   . Hearing loss   . Memory loss   . Diverticulosis    Past Surgical History  Procedure Laterality Date  . Tonsillectomy    . Broken ankle Right   . Cerebral aneurysm repair     Family History  Problem Relation Age of Onset  . Dementia Mother   . Stroke Mother   . Thyroid disease Mother   . Osteoporosis Mother   . Ovarian cancer Paternal Grandmother 70  . Ovarian cancer Paternal Aunt 60  . Breast cancer Daughter 66    breast ca x2, bilateral dx. 38, 46--estrogen  . Other  Daughter     no genetic testing as of 07/2015; has had ovaries removed  . Other Daughter     BRCA 2 Pos.  . Colon cancer Maternal Aunt     dx. unspecified age  . Emphysema Maternal Uncle   . High blood pressure Maternal Grandmother   . Dementia Maternal Grandfather   . Bleeding Disorder Maternal Grandfather   . Emphysema Maternal Aunt   . Emphysema Maternal Aunt    Social History  Substance Use Topics  . Smoking status: Former Smoker -- 1.00 packs/day for 41 years    Quit date: 01/13/2001  . Smokeless tobacco: Never Used  . Alcohol Use: No   OB History    Gravida Para Term Preterm AB TAB SAB Ectopic Multiple Living   '2 2 2       2     ' Review of Systems  10 Systems reviewed and all are negative for acute change except as noted in the HPI.  Allergies  Prozac; Sulfa antibiotics; Levaquin; Micardis; Prednisone; Augmentin; Avelox; and Norvasc  Home Medications   Prior to Admission medications   Medication Sig Start Date End Date Taking? Authorizing Provider  albuterol (PROVENTIL HFA;VENTOLIN HFA) 108 (90 Base) MCG/ACT inhaler Inhale 2 puffs into the lungs every 4 (four) hours as needed for wheezing or shortness of breath.   Yes Historical Provider, MD  aspirin EC 81 MG  tablet Take 81 mg by mouth daily.   Yes Historical Provider, MD  atenolol (TENORMIN) 50 MG tablet Take 50 mg by mouth 2 (two) times daily.   Yes Historical Provider, MD  captopril (CAPOTEN) 50 MG tablet Take 50 mg by mouth 2 (two) times daily.   Yes Historical Provider, MD  cholecalciferol (VITAMIN D) 1000 units tablet Take 1,000 Units by mouth daily.   Yes Historical Provider, MD  clonazePAM (KLONOPIN) 2 MG tablet Take 1 tablet (2 mg total) by mouth at bedtime. Patient taking differently: Take 1 mg by mouth at bedtime.  10/11/14  Yes Carmen Dohmeier, MD  fexofenadine (ALLEGRA) 180 MG tablet Take 180 mg by mouth daily.    Yes Historical Provider, MD  fluticasone (FLONASE) 50 MCG/ACT nasal spray Place 2 sprays into  the nose daily as needed for allergies. Spray twice daily as needed   Yes Historical Provider, MD  guaiFENesin (MUCINEX) 600 MG 12 hr tablet Take 1,200 mg by mouth 2 (two) times daily as needed for cough.    Yes Historical Provider, MD  levETIRAcetam (KEPPRA) 750 MG tablet Take 1 tablet (750 mg total) by mouth 2 (two) times daily. 10/12/14  Yes Carmen Dohmeier, MD  LEXAPRO 20 MG tablet Take 1 tablet (20 mg total) by mouth daily. Morning dosage Patient taking differently: Take 20 mg by mouth daily.  04/21/13  Yes Carmen Dohmeier, MD  simvastatin (ZOCOR) 10 MG tablet Take 10 mg by mouth at bedtime.   Yes Historical Provider, MD  triamterene-hydrochlorothiazide (MAXZIDE-25) 37.5-25 MG tablet Take 1 tablet by mouth daily. 07/03/15  Yes Historical Provider, MD   BP 154/53 mmHg  Pulse 67  Temp(Src) 97.7 F (36.5 C) (Oral)  Resp 18  Ht '5\' 4"'  (1.626 m)  Wt 231 lb (104.781 kg)  BMI 39.63 kg/m2  SpO2 94%  LMP 01/14/1995 (Approximate) Physical Exam  Constitutional: She is oriented to person, place, and time. She appears well-developed and well-nourished. No distress.  HENT:  Head: Normocephalic and atraumatic.  Moist mucous membranes  Eyes: Conjunctivae are normal. Pupils are equal, round, and reactive to light.  Neck: Neck supple.  Cardiovascular: Normal rate, regular rhythm and normal heart sounds.   No murmur heard. Pulmonary/Chest: Effort normal and breath sounds normal. No respiratory distress. She has no wheezes.  Abdominal: Soft. Bowel sounds are normal. She exhibits no distension. There is no tenderness.  Musculoskeletal: She exhibits edema (1+ BLE edema).  Neurological: She is alert and oriented to person, place, and time.  Fluent speech  Skin: Skin is warm and dry.  Psychiatric: She has a normal mood and affect. Judgment normal.  Nursing note and vitals reviewed.   ED Course  Procedures (including critical care time) DIAGNOSTIC STUDIES: Oxygen Saturation is 94% on RA, adequate by  my interpretation.    COORDINATION OF CARE: 1:27 AM Discussed treatment plan which includes lab work, CXR, EKG with pt at bedside and pt agreed to plan.  Labs Review Labs Reviewed  BASIC METABOLIC PANEL - Abnormal; Notable for the following:    Sodium 134 (*)    Potassium 3.3 (*)    Chloride 97 (*)    Glucose, Bld 176 (*)    All other components within normal limits  D-DIMER, QUANTITATIVE (NOT AT [facility]) - Abnormal; Notable for the following:    D-Dimer, Quant 0.54 (*)    All other components within normal limits  CBC  BRAIN NATRIURETIC PEPTIDE  I-STAT TROPOININ, ED  Randolm Idol, ED    Imaging  Review Dg Chest 2 View  07/30/2015  CLINICAL DATA:  Shortness of breath.  Bilateral leg swelling EXAM: CHEST  2 VIEW COMPARISON:  06/10/2008 FINDINGS: Normal heart size and stable mediastinal contours. No acute infiltrate or edema. No effusion or pneumothorax. Borderline hyperinflation. Mild atelectasis at the bases. Bilateral calcified pulmonary granulomas. No acute osseous findings. IMPRESSION: 1. Negative for edema. 2. Mild basilar atelectasis. 3. Borderline hyperinflation. Electronically Signed   By: Monte Fantasia M.D.   On: 07/30/2015 21:32   I have personally reviewed and evaluated these lab results as part of my medical decision-making.   EKG Interpretation   Date/Time:  Monday July 30 2015 20:51:58 EDT Ventricular Rate:  75 PR Interval:  182 QRS Duration: 100 QT Interval:  404 QTC Calculation: 451 R Axis:   -71 Text Interpretation:  Normal sinus rhythm Left axis deviation Anteroseptal  infarct , age undetermined Abnormal ECG difficult to compare to most  recent due to previous artifact, compared to 2009, abnormal R wave  progression and T wave  inversion aVL Confirmed by Ysabela Keisler MD, Brinton Brandel  [phone]) on 07/31/2015 1:14:51 AM Also confirmed by Amarise Lillo MD, Brannen Koppen  [phone]), editor [place]  CCT, [place] ([id number])  on 07/31/2015 7:13:22 AM      MDM   Final diagnoses:   Shortness of breath  Bilateral leg edema   Patient presents with intermittent shortness of breath that she has been having for years but states it is been worsening over the past week. No associated symptoms with chest pain or exertion. She was well-appearing on exam with vital signs notable only for hypertension. CXR negative. D-dimer negative when age-adjusted and no risk factors for clots. Labs show negative serial troponins and reassuring BMP and CBC. BNP is normal making heart failure very unlikely. Because of the chronicity of the patient's symptoms and her reassuring workup here, I feel that she is safe for discharge. I've instructed her to follow-up with PCP for consideration of further testing, such as Holter monitor or stress testing. I extensively reviewed return precautions and patient voice understanding. Patient discharged in satisfactory condition.  I personally performed the services described in this documentation, which was scribed in my presence. The recorded information has been reviewed and is accurate.   Sharlett Iles, MD 07/31/15 [phone]

## 2015-07-31 NOTE — ED Notes (Signed)
Pt verbalized understanding of discharge instructions and follow-up care. NAD noted at time of discharge. 

## 2015-08-07 ENCOUNTER — Encounter: Payer: Self-pay | Admitting: Cardiovascular Disease

## 2015-08-07 ENCOUNTER — Ambulatory Visit (INDEPENDENT_AMBULATORY_CARE_PROVIDER_SITE_OTHER): Payer: Medicare Other | Admitting: Cardiovascular Disease

## 2015-08-07 VITALS — BP 130/70 | HR 80 | Ht 64.0 in | Wt 233.8 lb

## 2015-08-07 DIAGNOSIS — R7981 Abnormal blood-gas level: Secondary | ICD-10-CM | POA: Diagnosis not present

## 2015-08-07 DIAGNOSIS — F17200 Nicotine dependence, unspecified, uncomplicated: Secondary | ICD-10-CM

## 2015-08-07 DIAGNOSIS — J449 Chronic obstructive pulmonary disease, unspecified: Secondary | ICD-10-CM | POA: Diagnosis not present

## 2015-08-07 DIAGNOSIS — Z720 Tobacco use: Secondary | ICD-10-CM | POA: Diagnosis not present

## 2015-08-07 DIAGNOSIS — R0602 Shortness of breath: Secondary | ICD-10-CM

## 2015-08-07 NOTE — Progress Notes (Signed)
Cardiology Office Note    Date:  08/07/2015   ID:  [name], DOB 27-Dec-1940, MRN [id number]  PCP:  Tawanna Solo, MD  Cardiologist:   Jenkins Rouge, MD   No chief complaint on file.   History of Present Illness:  Beth [name] is a 75 y.o. female referred for palpitations Seen by Dr Irish Lack 2009  After stroke. 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  Subsequent seizures. Has some word finding issues and cognitive deficits with emotional lability She takes Keppra and Klonopin and is unable to drive   Seen in ER [id number] with progressive dyspnea for a month Daily with palpitations and bilateral leg swelling  No cough fever or chest pain. Quit smoking in 2003 after 41 packyear history   K 3.3 Ddimer .54  Just above normal CTA not done CXR NAD atelectasis and hyperinflation  BNP normal troponin normal     Past Medical History:  Diagnosis Date  . Diverticulosis   . Hearing loss   . HTN (hypertension)   . Memory loss   . Seizure disorder, complex partial, 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[place]) seizure free for one year  . Seizures ([place])   . 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   . Vestibulitis of ear     Past Surgical History:  Procedure Laterality Date  . broken ankle Right   . CEREBRAL ANEURYSM REPAIR    . TONSILLECTOMY      Current Medications: Outpatient Medications Prior to Visit  Medication Sig Dispense Refill  . albuterol (PROVENTIL HFA;VENTOLIN HFA) 108 (90 Base) MCG/ACT inhaler Inhale 2 puffs into the lungs every 4 (four) hours as needed for wheezing or shortness of breath.    Marland Kitchen aspirin EC 81 MG tablet Take 81 mg by mouth daily.    Marland Kitchen atenolol (TENORMIN) 50 MG tablet Take 50 mg by mouth 2 (two) times daily.    . captopril (CAPOTEN) 50 MG tablet Take 50 mg by mouth 2 (two) times daily.    . cholecalciferol (VITAMIN D) 1000 units tablet Take 1,000 Units by mouth daily.    . clonazePAM (KLONOPIN) 2 MG tablet Take 1 tablet (2 mg  total) by mouth at bedtime. (Patient taking differently: Take 1 mg by mouth at bedtime. ) 90 tablet 1  . fexofenadine (ALLEGRA) 180 MG tablet Take 180 mg by mouth daily.     . fluticasone (FLONASE) 50 MCG/ACT nasal spray Place 2 sprays into the nose daily as needed for allergies. Spray twice daily as needed    . guaiFENesin (MUCINEX) 600 MG 12 hr tablet Take 1,200 mg by mouth 2 (two) times daily as needed for cough.     . levETIRAcetam (KEPPRA) 750 MG tablet Take 1 tablet (750 mg total) by mouth 2 (two) times daily. 180 tablet 3  . LEXAPRO 20 MG tablet Take 1 tablet (20 mg total) by mouth daily. Morning dosage (Patient taking differently: Take 20 mg by mouth daily. ) 90 tablet 3  . simvastatin (ZOCOR) 10 MG tablet Take 10 mg by mouth at bedtime.    . triamterene-hydrochlorothiazide (MAXZIDE-25) 37.5-25 MG tablet Take 1 tablet by mouth daily.     No facility-administered medications prior to visit.      Allergies:   Prozac [fluoxetine hcl]; Sulfa antibiotics; Levaquin [levofloxacin in d5w]; Micardis [telmisartan]; Prednisone; Augmentin [amoxicillin-pot clavulanate]; Avelox [moxifloxacin hcl in nacl]; and Norvasc [amlodipine besylate]   Social History   Social History  . Marital status: Married  Spouse name: Beth [name]  . Number of children: 2  . Years of education: 7   Social History Main Topics  . Smoking status: Former Smoker    Packs/day: 1.00    Years: 41.00    Quit date: 01/13/2001  . Smokeless tobacco: Never Used  . Alcohol use No  . Drug use: No  . Sexual activity: No   Other Topics Concern  . Not on file   Social History Narrative   Patient is married (Beth [name]) and lives at home with her husband.   Patient is retired.   Patient has two children.   Patient has a high school education.   Patient is right-handed.   Patient drinks 2 big mugs of coffee daily and sometimes tea in the evenings.     Family History:  The patient's family history includes Bleeding Disorder in her  maternal grandfather; Breast cancer (age of onset: 79) in her daughter; Colon cancer in her maternal aunt; Dementia in her maternal grandfather and mother; Emphysema in her maternal aunt, maternal aunt, and maternal uncle; High blood pressure in her maternal grandmother; Osteoporosis in her mother; Other in her daughter and daughter; Ovarian cancer (age of onset: 72) in her paternal grandmother; Ovarian cancer (age of onset: 17) in her paternal aunt; Stroke in her mother; Thyroid disease in her mother.   ROS:   Please see the history of present illness.    ROS All other systems reviewed and are negative.   PHYSICAL EXAM:   VS:  LMP 01/14/1995 (Approximate)    GEN: Well nourished, well developed, in no acute distress  HEENT: normal  Neck: no JVD, carotid bruits, or masses Cardiac: RRR; no murmurs, rubs, or gallops,no edema  Respiratory:  clear to auscultation bilaterally, normal work of breathing GI: soft, nontender, nondistended, + BS MS: no deformity or atrophy  Skin: warm and dry, no rash Neuro:  Alert and Oriented x 3, Strength and sensation are intact Psych: euthymic mood, full affect  Wt Readings from Last 3 Encounters:  07/30/15 231 lb (104.8 kg)  06/04/15 229 lb (103.9 kg)  05/31/15 228 lb (103.4 kg)      Studies/Labs Reviewed:   EKG:   08/01/15  SR rate 78 LAD poor R wave progression   Recent Labs: 08/18/2014: ALT 19 07/30/2015: B Natriuretic Peptide 24.5; BUN 11; Creatinine, Ser 0.80; Hemoglobin 14.7; Platelets 247; Potassium 3.3; Sodium 134   Lipid Panel    Component Value Date/Time   CHOL  04/16/2007 0545    146        ATP III CLASSIFICATION:  <200     mg/dL   Desirable  200-239  mg/dL   Borderline High  >=240    mg/dL   High   TRIG 75 04/16/2007 0545   HDL 47 04/16/2007 0545   CHOLHDL 3.1 04/16/2007 0545   VLDL 15 04/16/2007 0545   LDLCALC  04/16/2007 0545    84        Total Cholesterol/HDL:CHD Risk Coronary Heart Disease Risk Table                     Men    Women  1/2 Average Risk   3.4   3.3    Additional studies/ records that were reviewed today include:  [facility] primary [facility] Dr Sabra Heck     ASSESSMENT:    No diagnosis found.   PLAN:  In order of problems listed above:  1. Dyspnea: does not appear to  be cardiac related with no CHF on CXR and normal BNP Will f/u echo to assess RV/LV EF and PA pressures. Suspect this is more pulmonary in nature with smoking history and in office decreased sats with shallow breathing .  Will order CTA as she had LE edema and ddimer minimally up.  PFTls and f/u with pulmonary  2. Seizures:  F/u Dohmeier took Klonopin today for aura continue Keppra 3. HTN:  Well controlled.  Continue current medications and low sodium Dash type diet.   4. Edema:  Continue HCTZ seems to be dependant with varicose veins and obesity  Doubt DVT     Medication Adjustments/Labs and Tests Ordered: Current medicines are reviewed at length with the patient today.  Concerns regarding medicines are outlined above.  Medication changes, Labs and Tests ordered today are listed in the Patient Instructions below. There are no Patient Instructions on file for this visit.   Signed, Jenkins Rouge, MD  08/07/2015 3:58 PM    [facility] [place], [place], [place]  [address] Phone: [phone]; Fax: [phone]

## 2015-08-07 NOTE — Patient Instructions (Addendum)
Medication Instructions:  Your physician recommends that you continue on your current medications as directed. Please refer to the Current Medication list given to you today.  Lab work: NONE  Testing/Procedures: Non-Cardiac CT Angiography (CTA), is a special type of CT scan that uses a computer to produce multi-dimensional views of major blood vessels throughout the body. In CT angiography, a contrast material is injected through an IV to help visualize the blood vessels  Your physician has requested that you have an echocardiogram. Echocardiography is a painless test that uses sound waves to create images of your heart. It provides your doctor with information about the size and shape of your heart and how well your heart's chambers and valves are working. This procedure takes approximately one hour. There are no restrictions for this procedure.  Your physician has recommended that you have a pulmonary function test. Pulmonary Function Tests are a group of tests that measure how well air moves in and out of your lungs.  Follow-Up: Your physician wants you to follow-up in: 6 months with Dr. Johnsie Cancel. You will receive a reminder letter in the mail two months in advance. If you don't receive a letter, please call our office to schedule the follow-up appointment.  You have been referred to Pulmonology.    If you need a refill on your cardiac medications before your next appointment, please call your pharmacy.

## 2015-08-08 ENCOUNTER — Ambulatory Visit (INDEPENDENT_AMBULATORY_CARE_PROVIDER_SITE_OTHER)
Admission: RE | Admit: 2015-08-08 | Discharge: 2015-08-08 | Disposition: A | Discharge status: Home / Self Care | Payer: Medicare Other | Source: Ambulatory Visit | Attending: Cardiovascular Disease | Admitting: Cardiovascular Disease

## 2015-08-08 DIAGNOSIS — R7981 Abnormal blood-gas level: Secondary | ICD-10-CM | POA: Diagnosis not present

## 2015-08-08 DIAGNOSIS — R0602 Shortness of breath: Secondary | ICD-10-CM

## 2015-08-08 MED ORDER — IOPAMIDOL (ISOVUE-370) INJECTION 76%: 100.0000 mL | Freq: Once | INTRAVENOUS | Status: DC | PRN

## 2015-08-09 ENCOUNTER — Telehealth: Payer: Self-pay | Admitting: Genetic Counselor

## 2015-08-09 NOTE — Telephone Encounter (Signed)
Ms. Hellard had a call and some emails from [facility] telling her that she needed to provide more information.  She is wondering if I can reach out to them.  She is also concerned about the 'no-show' for the lab appt that she did not need on 7/11 at 3 PM.  I called [facility] and they have everything they need from her, the email that she received was about not completing her online profile and she does not need to finish this.  I have called Lexine Baton, our scheduler to have the no-show changed.  Let her know that this should happen either today or tomorrow.

## 2015-08-10 NOTE — Telephone Encounter (Signed)
Beth [name] was wondering why she had received emails from [facility] regarding missing information. We got that cleared up and she does not need to send them any additional information.  Testing is processing now.  She also would like to remove the no-show for her lab appt, which I have taken care of.  That was made in error.  She is welcome to call with any questions.

## 2015-08-21 ENCOUNTER — Other Ambulatory Visit: Payer: Self-pay

## 2015-08-21 ENCOUNTER — Ambulatory Visit (HOSPITAL_COMMUNITY): Payer: Medicare Other | Attending: Cardiovascular Disease

## 2015-08-21 DIAGNOSIS — R0600 Dyspnea, unspecified: Secondary | ICD-10-CM | POA: Insufficient documentation

## 2015-08-21 DIAGNOSIS — I119 Hypertensive heart disease without heart failure: Secondary | ICD-10-CM | POA: Diagnosis not present

## 2015-08-21 DIAGNOSIS — R002 Palpitations: Secondary | ICD-10-CM | POA: Insufficient documentation

## 2015-08-21 DIAGNOSIS — I340 Nonrheumatic mitral (valve) insufficiency: Secondary | ICD-10-CM | POA: Diagnosis not present

## 2015-08-21 DIAGNOSIS — R0602 Shortness of breath: Secondary | ICD-10-CM

## 2015-08-21 DIAGNOSIS — Z87891 Personal history of nicotine dependence: Secondary | ICD-10-CM | POA: Insufficient documentation

## 2015-08-21 DIAGNOSIS — G40909 Epilepsy, unspecified, not intractable, without status epilepticus: Secondary | ICD-10-CM | POA: Insufficient documentation

## 2015-08-21 DIAGNOSIS — I071 Rheumatic tricuspid insufficiency: Secondary | ICD-10-CM | POA: Insufficient documentation

## 2015-08-21 DIAGNOSIS — I059 Rheumatic mitral valve disease, unspecified: Secondary | ICD-10-CM | POA: Diagnosis not present

## 2015-08-22 ENCOUNTER — Telehealth: Payer: Self-pay | Admitting: Genetic Counselor

## 2015-08-22 NOTE — Telephone Encounter (Signed)
Discussed with Ms. Russman that her genetic test results were positive for the known familial pathogenic BRCA2 mutation, [phone] ([id number]), first discovered in her daughter.  Discussed that this puts her at a high risk for breast and ovarian cancers.  Discussed bringing her back in for a follow-up appointment, which should would like to do.  I will see her and her husband on Friday, August 11 at 1 PM.

## 2015-08-23 DIAGNOSIS — R0600 Dyspnea, unspecified: Secondary | ICD-10-CM | POA: Diagnosis not present

## 2015-08-24 ENCOUNTER — Ambulatory Visit (HOSPITAL_BASED_OUTPATIENT_CLINIC_OR_DEPARTMENT_OTHER): Payer: Medicare Other | Admitting: Genetic Counselor

## 2015-08-24 DIAGNOSIS — Z803 Family history of malignant neoplasm of breast: Secondary | ICD-10-CM

## 2015-08-24 DIAGNOSIS — Z8041 Family history of malignant neoplasm of ovary: Secondary | ICD-10-CM | POA: Diagnosis present

## 2015-08-24 DIAGNOSIS — Z1501 Genetic susceptibility to malignant neoplasm of breast: Secondary | ICD-10-CM

## 2015-08-24 DIAGNOSIS — Z1379 Encounter for other screening for genetic and chromosomal anomalies: Secondary | ICD-10-CM

## 2015-08-24 DIAGNOSIS — Z315 Encounter for genetic counseling: Secondary | ICD-10-CM

## 2015-08-24 DIAGNOSIS — Z800 Family history of malignant neoplasm of digestive organs: Secondary | ICD-10-CM | POA: Diagnosis not present

## 2015-08-24 DIAGNOSIS — Z1509 Genetic susceptibility to other malignant neoplasm: Secondary | ICD-10-CM

## 2015-08-26 ENCOUNTER — Encounter: Payer: Self-pay | Admitting: Obstetrics and Gynecology

## 2015-08-26 DIAGNOSIS — Z1509 Genetic susceptibility to other malignant neoplasm: Secondary | ICD-10-CM

## 2015-08-26 DIAGNOSIS — Z1501 Genetic susceptibility to malignant neoplasm of breast: Secondary | ICD-10-CM | POA: Insufficient documentation

## 2015-08-26 DIAGNOSIS — Z1379 Encounter for other screening for genetic and chromosomal anomalies: Secondary | ICD-10-CM | POA: Insufficient documentation

## 2015-08-26 DIAGNOSIS — Z1589 Genetic susceptibility to other disease: Secondary | ICD-10-CM | POA: Insufficient documentation

## 2015-08-26 NOTE — Progress Notes (Signed)
GENETIC TEST RESULTS   Patient Name: Beth [name] Patient Age: 75 y.o. Encounter Date: 08/24/2015  Referring Provider: Everardo All. Yisroel Ramming, MD  Primary Care Provider: Kathyrn Lass, MD   Beth [name] was previously seen in the [facility] on July 24, 2015 due to a family history of a known pathogenic BRCA2 mutation discovered in her daughter and a family history of breast and ovarian cancer and concern regarding hereditary breast and ovarian cancer syndrome in the family. Please refer to the prior Genetics clinic note from 07/24/15 for more information regarding [name]'s medical and family histories and our assessment at the time.   GENETIC TESTING:  At the time of Beth [name]'s visit, we recommended she pursue genetic testing of the 30-gene Hereditary Cancer Panel through [facility]. This test included sequencing and/or deletion/duplication analysis of the following genes:  APC, ATM, BAP1, BARD1, BMPR1A, BRCA1, BRCA2, BRIP1, CDH1, CDK4, CDKN2A, CHEK2, EPCAM, GREM1, MITF, MLH1, MSH2, MSH6, MUTYH, NBN, PALB2, PMS2, POLD1, POLE, PTEN, RAD51C, RAD51D, SMAD4, STK11, and TP53.  Testing was positive and revealed that Beth [name] shares the same heterozygous pathogenic mutation in the BRCA2 gene called "[id number] (p.[id number])".  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  MEDICAL MANAGEMENT: Women who have a pathogenic BRCA2 mutation have a high risk for both breast and ovarian cancer and a somewhat increased risk for pancreatic and melanoma skin cancer.  We discussed the recommendations below from the [place] (NCCN) that are specific to women with a BRCA2 mutation. We discussed that there are two options for decreasing breast cancer risk--increased breast screenings vs. risk-reducing bilateral mastectomies. Her ovaries are still intact, so we also discussed  the CA-125 and transvaginal ultrasound screening vs. BSO surgery.  An important consideration for Beth [name] is that, although she is at a high risk for breast and ovarian cancers (lifetime risk for breast cancer for a women with a BRCA2 mutation is approximately 40-84%; lifetime ovarian cancer risk is 17-27%), she is also 75 years old and has out-lived some of this lifetime risk.  We discussed that there are risks with surgery in general, and that this will be something she will need to discuss when reviewing her options with Dr. Quincy Simmonds.  Beth [name] is welcome to be seen here at the [facility], where her daughter Beth [name] is also seen, if she would like to discuss these options further with one of the breast oncologists.  Below are the NCCN BRCA-positive Management Recommendations (V. 2.2017, most recent as of August 2017).  Breast Cancer:  - Starting at age 54: Breast awareness - Women should be familiar with their breasts and promptly report changes to their healthcare provider. Performing regular breast self exams may help increase breast awareness, especially when checked at the end of the menstrual cycle.  - Starting at age 84: Clinical breast exam every 6-12 months. - Between ages 25-29 or individualized based on earlier family history: Breast MRI screening with contrast (preferred) every year or mammogram if MRI is unavailable. - Between ages 68-75: Mammogram and breast MRI screening every year.  - Option of risk-reducing bilateral mastectomies  Ovarian Cancer: - Recommend risk-reducing salpingo-oophorectomy (RRSO), typically between 24 and 23 y, and upon completion of child bearing. Because ovarian cancer onset in patients with BRCA2 mutations is an average of 8-10 years later than in patients with BRCA1 mutations, it is reasonable to delay RRSO until age  3-45 y in patients with BRCA2 mutations who have already maximized their breast cancer prevention (ie, undergone bilateral mastectomy).   -  While there may be circumstances where ovarian cancer screening with transvaginal ultrasound and a blood test for a protein called CA-125 are helpful, these techniques have not been shown to be effective in detecting early ovarian cancer and are generally not recommended.  - Consider risk reduction agents as options for breast and ovarian cancer, including discussing risks and benefits.  Other Cancers: We discussed that while literature has shown other cancers, such as pancreatic cancer and melanoma, to be associated with BRCA2 mutations, national guidelines do not currently recommend any specific screenings for these cancers. Screening may be individualized based on cancers observed in the family.  At this time, we are not aware of any history of melanoma or pancreatic cancer in Beth [name]'s family.  We may learn about additional BRCA2-related cancer risks, as our knowledge expands.  It is important that Beth [name] keep her contact information up-to-date with [facility], so that we may reach out to her with further information if/as it becomes available in the future.  FAMILY MEMBERS: It is important that all of Beth [name]'s relatives (both men and women) know of the presence of this gene mutation. Site-specific genetic testing can sort out who in the family is at risk and who is not.    Beth [name] daughter Beth [name] has a 50% chance to have inherited this mutation. We recommend that she have genetic testing for this same mutation, as identifying the presence of this mutation would allow her to also take advantage of risk-reducing measures.   Due to Beth [name]'s paternal family history of ovarian cancer, her father's side is the most suspicious for origin of this mutation.  We discussed that starting with testing of her paternal first cousins (the children of her paternal aunt who died of ovarian cancer), may be most appropriate.  However, she should inform all of her relatives of this risk, especially  since we have not yet determined for certain what side of the family this mutation was inherited from.  Once a relative on either side tests positive, then we will know for certain what side is at risk and what side is not at risk.  SUPPORT AND RESOURCES: If Ms. Breunig is interested in BRCA-specific information and support, there are two groups, Facing Our Risk of Cancer Empowered (FORCE; www.facingourrisk.com) and Bright Pink (www.brightpink.org) which some people have found useful. They provide opportunities to speak with other individuals from high-risk families. To locate genetic counselors in other cities, relatives can visit the website of the [employer] (ArtistMovie.se) and search for a Oncologist by zip code.  We encouraged Ms. Gow to remain in contact with [place] on an annual basis so we can update her personal and family histories, and let her know of advances in cancer genetics that may benefit the family. Our contact number was provided. Ms. Luviano questions were answered to her satisfaction today, and she knows she is welcome to call anytime with additional questions.    Jeanine Luz, [place], [place] Certified Genetic Counselor Phone: [phone] [email or web address]

## 2015-08-29 ENCOUNTER — Telehealth: Payer: Self-pay | Admitting: *Deleted

## 2015-08-29 DIAGNOSIS — Z1509 Genetic susceptibility to other malignant neoplasm: Principal | ICD-10-CM

## 2015-08-29 DIAGNOSIS — Z1501 Genetic susceptibility to malignant neoplasm of breast: Secondary | ICD-10-CM

## 2015-08-29 NOTE — Telephone Encounter (Signed)
Call to patient. Advised Dr Quincy Simmonds has received and reviewed report from genetic counselor. Recommends office visit for pelvic ultrasound and consult to discuss results and possible implications for future care. Patient states she is agreeable to appointment but previous vaginal ultrasound was quite uncomfortable and she was "screaming so loud she cleared the waiting room."  Advised to come with full bladder and we can attempt to try as abdominal ultrasound and if unable to visualize pelvic structures, will convert to transvaginal as tolerated. Patient agreeable to this plan and is aware that body habitus may limit transabdominal scan. Will proceed as tolerated. Appointment scheduled for 09-06-15.  Routing to provider for final review. Patient agreeable to disposition. Will close encounter.

## 2015-08-31 ENCOUNTER — Encounter: Payer: Self-pay | Admitting: Genetic Counselor

## 2015-09-06 ENCOUNTER — Encounter: Payer: Self-pay | Admitting: Obstetrics and Gynecology

## 2015-09-06 ENCOUNTER — Ambulatory Visit (INDEPENDENT_AMBULATORY_CARE_PROVIDER_SITE_OTHER): Payer: Medicare Other | Admitting: Obstetrics and Gynecology

## 2015-09-06 ENCOUNTER — Ambulatory Visit (INDEPENDENT_AMBULATORY_CARE_PROVIDER_SITE_OTHER): Payer: Medicare Other

## 2015-09-06 ENCOUNTER — Other Ambulatory Visit: Payer: Self-pay | Admitting: Obstetrics and Gynecology

## 2015-09-06 VITALS — BP 140/60 | HR 64 | Ht 63.5 in | Wt 229.0 lb

## 2015-09-06 DIAGNOSIS — Z1501 Genetic susceptibility to malignant neoplasm of breast: Secondary | ICD-10-CM

## 2015-09-06 DIAGNOSIS — Z1509 Genetic susceptibility to other malignant neoplasm: Principal | ICD-10-CM

## 2015-09-06 DIAGNOSIS — R140 Abdominal distension (gaseous): Secondary | ICD-10-CM

## 2015-09-06 NOTE — Progress Notes (Signed)
Patient ID: Beth [name], female   DOB: 1940-09-11, 75 y.o.   MRN: [id number] GYNECOLOGY  VISIT   HPI: 75 y.o.   Married  Caucasian  female   G2P2002 with Patient's last menstrual period was 01/14/1995 (approximate).   here for pelvic ultrasound for Positive BRCA2.   Husband present for a portion of the discussion today.    Interested in having surgical care for BRCA2 and rectocele repair.   Has rectocele and some leakage of urine with cough and sneeze.  Using glycerin tablets for constipation.  Miralax in past caused dehydration symptoms.  Uses splinting to have bowel movements.  Having shortness of breath.  Saw cardiology and had normal ECHO with EF 65 - 70%.  Told her that the SOB is not cardiac in origin. Will see pulmonologist in mid September. Hx tobacco use.  Has a history of hemorrhagic stroke and seizure disorder.   GYNECOLOGIC HISTORY: Patient's last menstrual period was 01/14/1995 (approximate). Contraception:  Postmenopausal Menopausal hormone therapy:  none Last mammogram:  05-30-15 3D/Density A/Neg/BiRads1:[facility] Last pap smear:   05-31-15 Neg        OB History    Gravida Para Term Preterm AB Living   _0 SAB TAB Ectopic Multiple Live Births           2         Patient Active Problem List   Diagnosis Date Noted  . Genetic testing 08/26/2015  . 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  . Family history of ovarian cancer 07/26/2015  . 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 Benign paroxysmal positional vertigo 04/06/2014  . Tonic-clonic seizure syndrome ([place]) 04/06/2014  . Pulsatile tinnitus of right ear 04/06/2014  . Other forms of epilepsy and recurrent seizures without mention of intractable epilepsy 04/21/2013  . SAH (subarachnoid hemorrhage) ([place]) 04/21/2013  . 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 Seizure disorder, complex partial, 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[place])   . Stroke, hemorrhagic ([place])   . HTN (hypertension)   . Vestibulitis of ear     Past  Medical History:  Diagnosis Date  . BRCA2 positive 2017  . Diverticulosis   . Hearing loss   . HTN (hypertension)   . Memory loss   . Seizure disorder, complex partial, 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[place]) seizure free for one year  . Seizures ([place])   . Stroke, hemorrhagic ([place]) aneurysm bleed.   . Vestibulitis of ear     Past Surgical History:  Procedure Laterality Date  . broken ankle Right   . CEREBRAL ANEURYSM REPAIR    . TONSILLECTOMY      Current Outpatient Prescriptions  Medication Sig Dispense Refill  . albuterol (PROVENTIL HFA;VENTOLIN HFA) 108 (90 Base) MCG/ACT inhaler Inhale 2 puffs into the lungs every 4 (four) hours as needed for wheezing or shortness of breath.    Marland Kitchen aspirin EC 81 MG tablet Take 81 mg by mouth daily.    Marland Kitchen atenolol (TENORMIN) 50 MG tablet Take 50 mg by mouth 2 (two) times daily.    . captopril (CAPOTEN) 50 MG tablet Take 50 mg by mouth 2 (two) times daily.    . cholecalciferol (VITAMIN D) 1000 units tablet Take 1,000 Units by mouth daily.    . clonazePAM (KLONOPIN) 2 MG tablet Take 1 tablet (2 mg total) by mouth at bedtime. (Patient taking differently: Take 1 mg by mouth at bedtime. ) 90 tablet 1  . fexofenadine (ALLEGRA) 180 MG tablet Take 180 mg  by mouth daily.     . fluticasone (FLONASE) 50 MCG/ACT nasal spray Place 2 sprays into the nose daily as needed for allergies. Spray twice daily as needed    . guaiFENesin (MUCINEX) 600 MG 12 hr tablet Take 1,200 mg by mouth 2 (two) times daily as needed for cough.     . levETIRAcetam (KEPPRA) 750 MG tablet Take 1 tablet (750 mg total) by mouth 2 (two) times daily. 180 tablet 3  . LEXAPRO 20 MG tablet Take 1 tablet (20 mg total) by mouth daily. Morning dosage (Patient taking differently: Take 20 mg by mouth daily. ) 90 tablet 3  . simvastatin (ZOCOR) 10 MG tablet Take 10 mg by mouth at bedtime.    . triamterene-hydrochlorothiazide (MAXZIDE-25) 37.5-25 MG tablet Take 1 tablet by mouth daily.     No current  facility-administered medications for this visit.      ALLERGIES: Prozac [fluoxetine hcl]; Sulfa antibiotics; Levaquin [levofloxacin in d5w]; Micardis [telmisartan]; Prednisone; Augmentin [amoxicillin-pot clavulanate]; Avelox [moxifloxacin hcl in nacl]; and Norvasc [amlodipine besylate]  Family History  Problem Relation Age of Onset  . Dementia Mother   . Stroke Mother   . Thyroid disease Mother   . Osteoporosis Mother   . Ovarian cancer Paternal Grandmother 37  . Ovarian cancer Paternal Aunt 48  . Breast cancer Daughter 39    breast ca x2, bilateral dx. 38, 46--estrogen  . Other Daughter     no genetic testing as of 07/2015; has had ovaries removed  . Other Daughter     BRCA 2 Pos.  . Colon cancer Maternal Aunt     dx. unspecified age  . Emphysema Maternal Uncle   . High blood pressure Maternal Grandmother   . Dementia Maternal Grandfather   . Bleeding Disorder Maternal Grandfather   . Emphysema Maternal Aunt   . Emphysema Maternal Aunt     Social History   Social History  . Marital status: Married    Spouse name: Jenny Reichmann  . Number of children: 2  . Years of education: 12   Occupational History  . Not on file.   Social History Main Topics  . Smoking status: Former Smoker    Packs/day: 1.00    Years: 41.00    Quit date: 01/13/2001  . Smokeless tobacco: Never Used  . Alcohol use No  . Drug use: No  . Sexual activity: No   Other Topics Concern  . Not on file   Social History Narrative   Patient is married ([name]) and lives at home with her husband.   Patient is retired.   Patient has two children.   Patient has a high school education.   Patient is right-handed.   Patient drinks 2 big mugs of coffee daily and sometimes tea in the evenings.    ROS:  Pertinent items are noted in HPI.  PHYSICAL EXAMINATION:    BP 140/60 (BP Location: Right Arm, Patient Position: Sitting, Cuff Size: Large)   Pulse 64   Ht 5' 3.5" (1.613 m)   Wt 229 lb (103.9 kg)   LMP  01/14/1995 (Approximate)   BMI 39.93 kg/m     General appearance: alert, cooperative and appears stated age  Pelvic ultrasound Uterus no masses.  Sliver of free fluid?   EMS 4.04 mm.  Calcifications 2 mm seen at endometrial interface.  Ovaries normal. No free fluid.  ASSESSMENT  BRCA 2 positive.   Normal pelvic ultrasound.  Genuine stress incontinence.  Rectocele.  Constipation. Shortness of  breath.  Hx hemorrhagic stroke. HTN.  PLAN  Discussion of BRCA 2 status and increased risk of breast and ovarian cancer.  Discussed laparoscopic BSO (with collection of pelvic washings) potential TVT Exact midurethral sling and rectocele repair +/- laparoscopic hysterectomy.  Will check CA125. She understands that if the CA125 is elevated, I will send her to a GYN ONC to perform her hysterectomy/BSO procedure.  Will will complete her cardiology and pulmonology consults which are needed prior to any surgical care. She will work on weight loss.     An After Visit Summary was printed and given to the patient.  __25____ minutes face to face time of which over 50% was spent in counseling.

## 2015-09-07 ENCOUNTER — Telehealth: Payer: Self-pay | Admitting: Obstetrics and Gynecology

## 2015-09-07 LAB — CA 125: CA 125: 11 U/mL (ref <35)

## 2015-09-07 NOTE — Telephone Encounter (Signed)
Opened in error

## 2015-09-13 DIAGNOSIS — J0101 Acute recurrent maxillary sinusitis: Secondary | ICD-10-CM | POA: Diagnosis not present

## 2015-09-19 ENCOUNTER — Other Ambulatory Visit: Payer: Self-pay | Admitting: Neurology

## 2015-09-19 DIAGNOSIS — R569 Unspecified convulsions: Secondary | ICD-10-CM

## 2015-09-19 DIAGNOSIS — I609 Nontraumatic subarachnoid hemorrhage, unspecified: Secondary | ICD-10-CM

## 2015-10-05 ENCOUNTER — Ambulatory Visit (INDEPENDENT_AMBULATORY_CARE_PROVIDER_SITE_OTHER): Payer: Medicare Other | Admitting: Pulmonary Disease

## 2015-10-05 DIAGNOSIS — R7981 Abnormal blood-gas level: Secondary | ICD-10-CM

## 2015-10-05 DIAGNOSIS — J449 Chronic obstructive pulmonary disease, unspecified: Secondary | ICD-10-CM

## 2015-10-05 DIAGNOSIS — R0602 Shortness of breath: Secondary | ICD-10-CM

## 2015-10-05 DIAGNOSIS — F17200 Nicotine dependence, unspecified, uncomplicated: Secondary | ICD-10-CM

## 2015-10-05 LAB — PULMONARY FUNCTION TEST
DL/VA % pred: 54 %
DL/VA: 2.64 ml/min/mmHg/L
DLCO COR % PRED: 40 %
DLCO COR: 9.75 ml/min/mmHg
DLCO UNC: 10.33 ml/min/mmHg
DLCO unc % pred: 42 %
FEF 25-75 POST: 0.53 L/s
FEF 25-75 Pre: 0.36 L/sec
FEF2575-%Change-Post: 49 %
FEF2575-%PRED-PRE: 21 %
FEF2575-%Pred-Post: 31 %
FEV1-%Change-Post: 14 %
FEV1-%PRED-POST: 47 %
FEV1-%PRED-PRE: 41 %
FEV1-POST: 1.02 L
FEV1-Pre: 0.89 L
FEV1FVC-%Change-Post: 5 %
FEV1FVC-%PRED-PRE: 68 %
FEV6-%CHANGE-POST: 10 %
FEV6-%PRED-POST: 68 %
FEV6-%PRED-PRE: 61 %
FEV6-POST: 1.86 L
FEV6-Pre: 1.68 L
FEV6FVC-%CHANGE-POST: 2 %
FEV6FVC-%PRED-POST: 103 %
FEV6FVC-%Pred-Pre: 101 %
FVC-%CHANGE-POST: 8 %
FVC-%PRED-POST: 65 %
FVC-%Pred-Pre: 60 %
FVC-POST: 1.88 L
FVC-Pre: 1.73 L
POST FEV6/FVC RATIO: 99 %
PRE FEV1/FVC RATIO: 51 %
Post FEV1/FVC ratio: 54 %
Pre FEV6/FVC Ratio: 97 %
RV % pred: 154 %
RV: 3.5 L
TLC % PRED: 106 %
TLC: 5.38 L

## 2015-10-08 ENCOUNTER — Encounter: Payer: Self-pay | Admitting: Pulmonary Disease

## 2015-10-08 ENCOUNTER — Ambulatory Visit (INDEPENDENT_AMBULATORY_CARE_PROVIDER_SITE_OTHER): Payer: Medicare Other | Admitting: Pulmonary Disease

## 2015-10-08 ENCOUNTER — Telehealth: Payer: Self-pay | Admitting: Neurology

## 2015-10-08 VITALS — BP 142/80 | HR 71 | Ht 63.5 in | Wt 237.0 lb

## 2015-10-08 DIAGNOSIS — J449 Chronic obstructive pulmonary disease, unspecified: Secondary | ICD-10-CM

## 2015-10-08 DIAGNOSIS — J441 Chronic obstructive pulmonary disease with (acute) exacerbation: Secondary | ICD-10-CM | POA: Insufficient documentation

## 2015-10-08 MED ORDER — ALBUTEROL SULFATE HFA 108 (90 BASE) MCG/ACT IN AERS: 2.0000 | INHALATION_SPRAY | RESPIRATORY_TRACT | 5 refills | Status: DC | PRN

## 2015-10-08 NOTE — Telephone Encounter (Signed)
It appears that Beth [name] already sees the pt for seizures and memory concerns. She has an appt on 10/10/15 with Beth Blossom, NP.   [name], I think this pt should just keep her appt with you on 10/10/2015 (since you see her already for seizures and memory concerns), unless you think this might be better handled with Dr. Brett Fairy.

## 2015-10-08 NOTE — Assessment & Plan Note (Signed)
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 Continue using your Pro Air inhaler up to every 4 hours as needed for shortness of breath or wheezing. We will send in a refill for your Pro Air [facility] Referral to Cardio-pulmonary rehab.( Dr. Elsworth Soho) We will give you a sample of Anoro. This is your maintenance medication. ( Use it every day without fail) Take 1 puff once daily. If the Anoro works well for you, please call us so we can send in a prescription. Follow up with Dr. Elsworth Soho  Or NP in 1 month. Flu shot today Please contact office for sooner follow up if symptoms do not improve or worsen or seek emergency care

## 2015-10-08 NOTE — Progress Notes (Signed)
 Subjective:    Patient ID: Beth [name], female    DOB: 02/15/1940, 75 y.o.   MRN: [id number]   Beth [name] is a 75 year old obese  female former smoker ( Quit 2009) here for evaluation of dyspnea off and on for approximately 6 months. She is being seen by Dr. Alva.  HPI  Pt. Presents today for consultation for increased shortness of breath referred by Dr. Nishan . She states she has had a significant worsening of her shortness of breath in Spring of 2017. Over the last 6 months she has noted it is even worse. The dyspnea is made worse hot weather and humidity and allergies. She states she feels there is an anxiety component to it. She is followed by Neurology Dr. Dohmeier for seizures after stroke in 2008. She states that her shortness of breath is made worse by getting nervous. She does a lot of throat clearing and coughs during the spring and fall due to post nasal drip.She states she does not feel she has reflux. She had desaturated to 84% after walking into the exam room.She did rebound to 92% with rest. She notes shortness of breath is worse with walking at a fast pace, climbing stairs. She did have ankle surgery in the past which has impeded her ability to exercise.She had PFT's this past Friday.Dr. Alva had spoken with her about her results. He told her that she had lung disease secondary to her smoking history.He explained that she has COPD.She states she does not have frequent bronchitis episodes. She denies chest pain, fever orthopnea or hemoptysis.   PFT's 10/05/2015:  FVC: 1.73/60% FEV1: 0.89/ 41% F/F Ratio:51/68% TLC:5.38/106% DLCO: 42%     Chief Complaint  Patient presents with  . PULMONARY CONSULT    Referred by Dr. Peter Beth [name]. Pt c/o increased ShOB and wheeze that has been increasing in frequency over the past 6 months. Pt has seen cardiology and they have referred her here. Pt has not been diagnosed with COPD but it has been mentioned.      Past Medical History:    Diagnosis Date  . BRCA2 positive 2017  . Diverticulosis   . Hearing loss   . HTN (hypertension)   . Memory loss   . 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 Seizures ([facility])   . Stroke, hemorrhagic ([facility]) aneurysm bleed.   . Vestibulitis of ear     Past Surgical History:  Procedure Laterality Date  . broken ankle Right   . CEREBRAL ANEURYSM REPAIR    . TONSILLECTOMY     Family History  Problem Relation Age of Onset  . Dementia Mother   . Stroke Mother   . Thyroid disease Mother   . Osteoporosis Mother   . Ovarian cancer Paternal Grandmother 62  . Ovarian cancer Paternal Aunt 70  . Breast cancer Daughter 38    breast ca x2, bilateral dx. 38, 46--estrogen  . Other Daughter     no genetic testing as of 07/2015; has had ovaries removed  . Other Daughter     BRCA 2 Pos.  . Colon cancer Maternal Aunt     dx. unspecified age  . Emphysema Maternal Uncle   . High blood pressure Maternal Grandmother   . Dementia Maternal Grandfather   . Bleeding Disorder Maternal Grandfather   . Emphysema Maternal Aunt   . Emphysema Maternal Aunt    Social History   Social History  .   Marital status: Married    Spouse name: Beth [name]  . Number of children: 2  . Years of education: 72   Social History Main Topics  . Smoking status: Former Smoker    Packs/day: 1.00    Years: 41.00    Quit date: 01/13/2001  . Smokeless tobacco: Never Used  . Alcohol use No  . Drug use: No  . Sexual activity: No   Other Topics Concern  . None   Social History Narrative   Patient is married ([name]) and lives at home with her husband.   Patient is retired.   Patient has two children.   Patient has a high school education.   Patient is right-handed.   Patient drinks 2 big mugs of coffee daily and sometimes tea in the evenings.   Past Surgical History:  Procedure Laterality Date  . broken ankle Right   . CEREBRAL ANEURYSM REPAIR    . TONSILLECTOMY       Current Outpatient Prescriptions:  .  albuterol (PROVENTIL HFA;VENTOLIN HFA) 108 (90 Base) MCG/ACT inhaler, Inhale 2 puffs into the lungs every 4 (four) hours as needed for wheezing or shortness of breath., Disp: , Rfl:  .  aspirin EC 81 MG tablet, Take 81 mg by mouth daily., Disp: , Rfl:  .  atenolol (TENORMIN) 50 MG tablet, Take 50 mg by mouth 2 (two) times daily., Disp: , Rfl:  .  captopril (CAPOTEN) 50 MG tablet, Take 50 mg by mouth 2 (two) times daily., Disp: , Rfl:  .  cholecalciferol (VITAMIN D) 1000 units tablet, Take 1,000 Units by mouth daily., Disp: , Rfl:  .  clonazePAM (KLONOPIN) 2 MG tablet, Take 1 tablet (2 mg total) by mouth at bedtime. (Patient taking differently: Take 1 mg by mouth at bedtime. ), Disp: 90 tablet, Rfl: 1 .  fexofenadine (ALLEGRA) 180 MG tablet, Take 180 mg by mouth daily. , Disp: , Rfl:  .  fluticasone (FLONASE) 50 MCG/ACT nasal spray, Place 2 sprays into the nose daily as needed for allergies. Spray twice daily as needed, Disp: , Rfl:  .  guaiFENesin (MUCINEX) 600 MG 12 hr tablet, Take 1,200 mg by mouth 2 (two) times daily as needed for cough. , Disp: , Rfl:  .  KEPPRA 750 MG tablet, TAKE 1 TABLET TWICE A DAY, Disp: 180 tablet, Rfl: 3 .  LEXAPRO 20 MG tablet, Take 1 tablet (20 mg total) by mouth daily. Morning dosage (Patient taking differently: Take 20 mg by mouth daily. ), Disp: 90 tablet, Rfl: 3 .  simvastatin (ZOCOR) 10 MG tablet, Take 10 mg by mouth at bedtime., Disp: , Rfl:  .  triamterene-hydrochlorothiazide (MAXZIDE-25) 37.5-25 MG tablet, Take 1 tablet by mouth daily., Disp: , Rfl:    Review of Systems  Constitutional: Negative.  Negative for fever and unexpected weight change.  HENT: Positive for congestion, rhinorrhea and sinus pressure. Negative for dental problem, ear pain, nosebleeds, postnasal drip, sneezing, sore throat and trouble swallowing.   Eyes: Negative.  Negative for redness and itching.  Respiratory: Positive for chest tightness,  shortness of breath and wheezing. Negative for cough.   Cardiovascular: Positive for leg swelling. Negative for palpitations.  Gastrointestinal: Negative.  Negative for nausea and vomiting.  Endocrine: Negative.   Genitourinary: Negative.  Negative for dysuria.  Musculoskeletal: Positive for arthralgias. Negative for joint swelling.  Skin: Negative.  Negative for rash.  Allergic/Immunologic: Positive for environmental allergies.  Neurological: Positive for headaches.  Hematological: Negative.  Does not bruise/bleed easily.  Psychiatric/Behavioral: Negative.  Negative for dysphoric mood. The patient is not nervous/anxious.        Objective:   Physical Exam  BP (!) 142/80 (BP Location: Left Arm, Cuff Size: Normal)   Pulse 71   Ht 5' 3.5" (1.613 m)   Wt 237 lb (107.5 kg)   LMP 01/14/1995 (Approximate)   SpO2 92%   BMI 41.32 kg/m    General- No distress,  A&Ox3, pleasant, obese, deconditioned ENT: No sinus tenderness, TM clear, pale nasal mucosa, no oral exudate,no post nasal drip, no LAN Cardiac: S1, S2, regular rate and rhythm, no murmur Chest: No wheeze/ no rales/ dullness; no accessory muscle use, no nasal flaring, no sternal retractions Abd.: Soft Non-tender Ext: No clubbing cyanosis, edema Neuro:  normal strength Skin: No rashes, warm and dry Psych:anxious        Assessment & Plan:    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 Continue using your Pro Air inhaler up to every 4 hours as needed for shortness of breath or wheezing. We will send in a refill for your Pro Air [facility] Referral to Cardio-pulmonary rehab.( Dr. Elsworth Soho) We will give you a sample of Anoro. This is your maintenance medication. ( Use it every day without fail) Take 1 puff once daily. If the Anoro works well for you, please call us so we can send in a prescription. Follow up with Dr. Elsworth Soho  Or NP in 1 month. Please contact office for sooner  follow up if symptoms do not improve or worsen or seek emergency care   Preventative Maintenance / Therapy: Flu Shot today   ATTENDING NOTE: I have personally reviewed patient's available data, including medical history, events of note, physical examination and test results as part of my evaluation.  75 year-old ex-smoker, quit in 2009, referred to [place] by cardiology for wheezing and dyspnea. She reports chronic throat clearing which she attributes to sinus drip, has occasional heartburn. Oxygen saturation is noted to drop to 84% on walking but maintains a 92% at rest. Exam-obese, decreased breath sounds bilateral, no rhonchi, no pedal edema, no JVD S1-S2 normal  CT angios chest 07/2015 >> no pulmonary embolism, no emphysema  PFTs 09/2015>> severe airway obstruction with FEV1 40%, improves to 47% with albuterol, DLCO decreased to 45%  Impression/plan Severe COPD- will add LABA/ LAMA combinations such as Anoro once daily Pulmonary rehabilitation Use albuterol MDI 2 puffs as needed only We will assess need for oxygen in the future Signs and symptoms of bronchitis were discussed      Rest per NP whose note is outlined above and that I agree with and edited in full.

## 2015-10-08 NOTE — Telephone Encounter (Signed)
I called to remind Beth [name] about her appt today for wed 9/27 and she said she wanted to talk to someone because she is having some problems. She is having some things come up since her last visit. She is having reading problems, it doesn't come on when she is around the house but it just happens. And she said it may be because she isn't breathing. & she thought she was going into seizure a couple of times. She said when this happens she takes a Klonopin 2mg  to wear it off.. She said that was her first time to use it for that purpose. The best number to contact patient is  [phone]

## 2015-10-08 NOTE — Telephone Encounter (Signed)
Ok to leave on my schedule

## 2015-10-08 NOTE — Patient Instructions (Addendum)
It is nice to meet you today. Continue taking your Allegra daily as you have been doing. Continue using your Pro Air inhaler up to every 4 hours as needed for shortness of breath or wheezing. We will send in a refill for your Pro Air [facility] Referral to Cardio-pulmonary rehab.( Dr. Elsworth Soho) We will give you a sample of Anoro. This is your maintenance medication. ( Use it every day without fail) Take 1 puff once daily. If the Anoro works well for you, please call us so we can send in a prescription. Follow up with Dr. Elsworth Soho  Or NP in 1 month. Flu shot today Please contact office for sooner follow up if symptoms do not improve or worsen or seek emergency care

## 2015-10-08 NOTE — Telephone Encounter (Signed)
I spoke to pt. I advised her that Jinny Blossom, NP will see her on Wednesday as scheduled to address these concerns. Pt verbalized understanding and thanked me for my call.

## 2015-10-10 ENCOUNTER — Encounter: Payer: Self-pay | Admitting: Adult Health

## 2015-10-10 ENCOUNTER — Ambulatory Visit (INDEPENDENT_AMBULATORY_CARE_PROVIDER_SITE_OTHER): Payer: Medicare Other | Admitting: Adult Health

## 2015-10-10 VITALS — BP 102/45 | HR 60 | Ht 63.5 in | Wt 238.4 lb

## 2015-10-10 DIAGNOSIS — I609 Nontraumatic subarachnoid hemorrhage, unspecified: Secondary | ICD-10-CM | POA: Diagnosis not present

## 2015-10-10 DIAGNOSIS — R569 Unspecified convulsions: Secondary | ICD-10-CM

## 2015-10-10 DIAGNOSIS — R413 Other amnesia: Secondary | ICD-10-CM | POA: Diagnosis not present

## 2015-10-10 MED ORDER — KEPPRA 750 MG PO TABS: 750.0000 mg | ORAL_TABLET | Freq: Two times a day (BID) | ORAL | 3 refills | Status: DC

## 2015-10-10 NOTE — Progress Notes (Signed)
Beth [name]: Beth Beth [name] DOB: Aug 04, 1940  REASON FOR VISIT: follow up- seizures HISTORY FROM: Beth [name]  HISTORY OF PRESENT ILLNESS: Beth Beth [name] is a 75 year old female with a history of seizures. She returns today for follow-up. She continues to take Keppra 750 mg twice a day. She denies any seizure events. She is able to complete all ADLs independently. She is currently not operating a motor vehicle. In Beth past Beth Beth [name] has reported trouble with her memory. At Beth last visit her memory score has remained stable. Beth [name] reports that her memory continued to remained stable. She states that she is forgetful at times reports having a hard time recalling certain things such as a restaurant name. She states that she has been diagnosed with COPD. During this flare up she felt that she had an abnormal heart rhythm and was concerned about a potential stroke. However she reports she did not have any strokelike symptoms. She is currently on medication and inhalers to treat her COPD that she feels is working well for her. She returns today for an evaluation.   HISTORY 04/10/15: Beth Beth [name] is a 75 year old female with a history of seizures. She returns today for follow-up. She is currently taking Keppra 750 mg twice a day. She reports that she is tolerating this medication well. She denies any new neurological symptoms. She states at home she is able to complete all ADLs independently. She stopped driving after her stroke. 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 She states that at Beth last visit Dr. Brett [name] ordered belsomra and gave her samples however she did not start this medication. She states that she has continue using Beth Klonopin. She reports that she has had some issues breathing and was given albuterol. She states that she is still having some difficulty breathing and her primary care mentioned a sleep study. She denies snoring. She denies any excessive daytime sleepiness.  Denies a morning headache. She states that her husband has not witnessed any apnea events. Beth Beth [name] feels that her memory has remained stable. She just returned from traveling to see family members. She does report some numbness in Beth fingertips on Beth right hand. Occasionally will drop things in Beth right hand. However this time she does not want to proceed with any additional testing. She returns today for an evaluation.  HISTORY 10/11/14 [facility]): Beth [name] returned for a yearly follow up. She reports medical problems: a carbuncle with MRSA, diagnosed spring 2016 and treated with ATB ( doxicycline) by Dr. Amy Beth [name] , Dermatologist . Beth Beth [name] tolerated Beth oral doxycycline fine. Beth Beth [name]'s cognitive status remains stable we performed a Montral cognitive assessment test today and she scored well on Beth clock drawing she has a mild tremor which impairs Beth contour of Beth clock face, she scored well on this Trail Making Test name 3 animals had some trouble again this tremor on drawing a cube. Was able to do Beth attention tests but not subtraction. Language and abstraction were tested Beth Beth [name] only had a word fluency test of 9 points. She was able to score 4 out of 5 recall words and was fully oriented to date place and time. She scored 26 out of 30 points, is a HS graduate. 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 No seizures in Beth interval time.   Beth Beth [name] is a 75 y.o. female here seen originally many years ago as a  referral from Beth Beth [name] ,  Beth Beth [name] is an established Beth [name] of my practice. 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  She also has a history of hypertension, osteopenia, vertigo and some mild hearing loss, classified as vestibulitis in her older records. Surgical history is positive for right foot fracture in May 2010 and 4 surgeries  to Beth ankle. Her ankle was fractured in a seizure related incident 2010,  twisting her foot in Beth midst of her typical left twisting body movements during a seizure.  Beth Beth [name] brain injury in 2008 has affected her cognitively and she often struggles to find words. 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 She is taking 20 mg daily. She also takes vitamin D against fatigue. Her affect is better controlled and she feels more assured, and now is less socially isolated.  Beth Beth [name] had been seizure-free for over 12 months by Beth time of her April 2014 visit, now she reports that she had one seizure around Christmas 2014, while compliant on her current regimen of Keppra and Clonazepam.  She fills her medications at [facility] on [address] in [place].Vestibular evaluation for severe vertigo   REVIEW OF SYSTEMS: Out of a complete 14 system review of symptoms, Beth Beth [name] complains only of Beth following symptoms, and all other reviewed systems are negative.  ALLERGIES: Allergies  Allergen Reactions  . Prozac [Fluoxetine Hcl] Other (See Comments)    seizures  . Sulfa Antibiotics Nausea And Vomiting and Other (See Comments)    TINGLING IN LEGS ALSO  . Levaquin [Levofloxacin In D5w] Other (See Comments)    Mood changes? Questionable per Beth [name]-IF PATIENTS NEEDS MEDICATION, PREFERS TO USE LEVAQUIN-DUE TO WORKING GREAT FOR Beth [name].   . Micardis [Telmisartan] Swelling and Other (See Comments)    Swelling, bruising  . Prednisone Other (See Comments)    mood changes Mood changes  . Lisinopril-Hydrochlorothiazide Other (See Comments)  . Augmentin [Amoxicillin-Pot Clavulanate] Rash  . Avelox [Moxifloxacin Hcl In Nacl] Rash    LEVAQUIN IS OK-SEE NOTE  . Norvasc [Amlodipine Besylate] Other (See Comments)    fatigue    HOME MEDICATIONS: Outpatient Medications Prior to Visit  Medication Sig Dispense Refill  .  albuterol (PROVENTIL HFA;VENTOLIN HFA) 108 (90 Base) MCG/ACT inhaler Inhale 2 puffs into Beth lungs every 4 (four) hours as needed for wheezing or shortness of breath. 1 Inhaler 5  . aspirin EC 81 MG tablet Take 81 mg by mouth daily.    Marland Kitchen atenolol (TENORMIN) 50 MG tablet Take 50 mg by mouth 2 (two) times daily.    . captopril (CAPOTEN) 50 MG tablet Take 50 mg by mouth 2 (two) times daily.    . cholecalciferol (VITAMIN D) 1000 units tablet Take 1,000 Units by mouth daily.    . clonazePAM (KLONOPIN) 2 MG tablet Take 1 tablet (2 mg total) by mouth at bedtime. (Beth [name] taking differently: Take 1 mg by mouth at bedtime. ) 90 tablet 1  . fexofenadine (ALLEGRA) 180 MG tablet Take 180 mg by mouth daily.     . fluticasone (FLONASE) 50 MCG/ACT nasal spray Place 2 sprays into Beth nose daily as needed for allergies. Spray twice daily as needed    . guaiFENesin (MUCINEX) 600 MG 12 hr tablet Take 1,200 mg by mouth 2 (two) times daily as needed for cough.     Marland Kitchen KEPPRA 750 MG tablet TAKE 1  TABLET TWICE A DAY 180 tablet 3  . LEXAPRO 20 MG tablet Take 1 tablet (20 mg total) by mouth daily. Morning dosage (Beth [name] taking differently: Take 20 mg by mouth daily. ) 90 tablet 3  . simvastatin (ZOCOR) 10 MG tablet Take 10 mg by mouth at bedtime.    . triamterene-hydrochlorothiazide (MAXZIDE-25) 37.5-25 MG tablet Take 1 tablet by mouth daily.     No facility-administered medications prior to visit.     PAST MEDICAL HISTORY: Past Medical History:  Diagnosis Date  . BRCA2 positive 2017  . Diverticulosis   . Hearing loss   . HTN (hypertension)   . Memory loss   . Seizure disorder, complex partial, 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[place]) seizure free for one year  . Seizures ([place])   . 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   . Vestibulitis of ear     PAST SURGICAL HISTORY: Past Surgical History:  Procedure Laterality Date  . broken ankle Right   . CEREBRAL ANEURYSM REPAIR    . TONSILLECTOMY      FAMILY  HISTORY: Family History  Problem Relation Age of Onset  . Dementia Mother   . Stroke Mother   . Thyroid disease Mother   . Osteoporosis Mother   . Ovarian cancer Paternal Grandmother 5  . Ovarian cancer Paternal Aunt 43  . Breast cancer Daughter 3    breast ca x2, bilateral dx. 38, 46--estrogen  . Other Daughter     no genetic testing as of 07/2015; has had ovaries removed  . Other Daughter     BRCA 2 Pos.  . Colon cancer Maternal Aunt     dx. unspecified age  . Emphysema Maternal Uncle   . High blood pressure Maternal Grandmother   . Dementia Maternal Grandfather   . Bleeding Disorder Maternal Grandfather   . Emphysema Maternal Aunt   . Emphysema Maternal Aunt     SOCIAL HISTORY: Social History   Social History  . Marital status: Married    Spouse name: Jenny Reichmann  . Number of children: 2  . Years of education: 12   Occupational History  . Not on file.   Social History Main Topics  . Smoking status: Former Smoker    Packs/day: 1.00    Years: 41.00    Quit date: 01/13/2001  . Smokeless tobacco: Never Used  . Alcohol use No  . Drug use: No  . Sexual activity: No   Other Topics Concern  . Not on file   Social History Narrative   Beth [name] is married ([name]) and lives at home with her husband.   Beth [name] is retired.   Beth [name] has two children.   Beth [name] has a high school education.   Beth [name] is right-handed.   Beth [name] drinks 2 big mugs of coffee daily and sometimes tea in Beth evenings.      PHYSICAL EXAM  Vitals:   10/10/15 1329  BP: (!) 102/45  Pulse: 60  Weight: 238 lb 6.4 oz (108.1 kg)  Height: 5' 3.5" (1.613 m)   Body mass index is 41.57 kg/m.   Montreal Cognitive Assessment  10/10/2015 10/11/2014  Visuospatial/ Executive (0/5) 4 4  Naming (0/3) 1 3  Attention: Read list of digits (0/2) 2 2  Attention: Read list of letters (0/1) 1 1  Attention: Serial 7 subtraction starting at 100 (0/3) 2 2  Language: Repeat phrase (0/2) 2 2  Language : Fluency  (0/1) 1 0  Abstraction (0/2) 2 2  Delayed Recall (0/5) 5 4  Orientation (0/6) 6 6  Total 26 26  Adjusted Score (based on education) - 27     Generalized: Well developed, in no acute distress   Neurological examination  Mentation: Alert oriented to time, place, history taking. Follows all commands speech and language fluent Cranial nerve II-XII: Pupils were equal round reactive to light. Extraocular movements were full, visual field were full on confrontational test. Facial sensation and strength were normal. Uvula tongue midline. Head turning and shoulder shrug  were normal and symmetric. Motor: Beth motor testing reveals 5 over 5 strength of all 4 extremities. Good symmetric motor tone is noted throughout.  Sensory: Sensory testing is intact to soft touch on all 4 extremities. No evidence of extinction is noted.  Coordination: Cerebellar testing reveals good finger-nose-finger and heel-to-shin bilaterally.  Gait and station: Gait is normal. .  Reflexes: Deep tendon reflexes are symmetric and normal bilaterally.   DIAGNOSTIC DATA (LABS, IMAGING, TESTING) - I reviewed Beth [name] records, labs, notes, testing and imaging myself where available.  Lab Results  Component Value Date   WBC 9.1 07/30/2015   HGB 14.7 07/30/2015   HCT 43.0 07/30/2015   MCV 95.3 07/30/2015   PLT 247 07/30/2015      Component Value Date/Time   NA 134 (L) 07/30/2015 2103   K 3.3 (L) 07/30/2015 2103   CL 97 (L) 07/30/2015 2103   CO2 27 07/30/2015 2103   GLUCOSE 176 (H) 07/30/2015 2103   BUN 11 07/30/2015 2103   CREATININE 0.80 07/30/2015 2103   CALCIUM 9.3 07/30/2015 2103   PROT 7.1 08/18/2014 1522   ALBUMIN 3.7 08/18/2014 1522   AST 22 08/18/2014 1522   ALT 19 08/18/2014 1522   ALKPHOS 46 08/18/2014 1522   BILITOT 0.6 08/18/2014 1522   GFRNONAA >60 07/30/2015 2103   GFRAA >60 07/30/2015 2103       ASSESSMENT AND PLAN 75 y.o. year old female  has a past medical history of BRCA2 positive (2017);  Diverticulosis; Hearing loss; HTN (hypertension); Memory loss; Seizure disorder, complex partial, 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[place]) (seizure free for one year); Seizures ([place]); Stroke, hemorrhagic ([place]) (aneurysm bleed. ); and Vestibulitis of ear. here with:   1. Seizures 2. Memory disturbance  Overall Beth Beth [name] is doing well. She'll continue on Keppra 750 mg twice a day. Beth Beth [name]'s memory score has remained stable. We will continue to monitor. Beth [name] advised that if her symptoms worsen or she develops any new symptoms she should let us know. Follow-up in 6 months with Dr. Mechele Claude, MSN, NP-C 10/10/2015, 1:48 PM [facility] [address], [place], [place] [address] [phone]

## 2015-10-10 NOTE — Patient Instructions (Signed)
Memory score has remained stable. We'll continue to monitor. Continue Keppra 750 mg twice a day If your symptoms worsen or you develop new symptoms please let us know.

## 2015-10-15 ENCOUNTER — Telehealth: Payer: Self-pay | Admitting: Pulmonary Disease

## 2015-10-15 MED ORDER — UMECLIDINIUM-VILANTEROL 62.5-25 MCG/INH IN AEPB: 1.0000 | INHALATION_SPRAY | Freq: Every day | RESPIRATORY_TRACT | 1 refills | Status: DC

## 2015-10-15 NOTE — Telephone Encounter (Signed)
Called and spoke with pt and she stated that she would like another sample of the anoro.  I advised the pt that we did not have any samples of the anoro.  She requested that this be sent to the pharmacy for her to try.  Nothing further is needed.

## 2015-10-25 ENCOUNTER — Other Ambulatory Visit: Payer: Self-pay | Admitting: Neurology

## 2015-10-25 DIAGNOSIS — I609 Nontraumatic subarachnoid hemorrhage, unspecified: Secondary | ICD-10-CM

## 2015-10-25 DIAGNOSIS — F09 Unspecified mental disorder due to known physiological condition: Secondary | ICD-10-CM

## 2015-10-25 DIAGNOSIS — G40409 Other generalized epilepsy and epileptic syndromes, not intractable, without status epilepticus: Secondary | ICD-10-CM

## 2015-10-25 MED ORDER — CLONAZEPAM 2 MG PO TABS: 2.0000 mg | ORAL_TABLET | Freq: Every day | ORAL | 1 refills | Status: DC

## 2015-10-25 NOTE — Telephone Encounter (Signed)
Patient saw Jinny Blossom not that long ago and she forgot to get a refill on her Klonopin 2Mg . She said she needs it sent over to [facility].. The best number to contact patient [phone]

## 2015-10-25 NOTE — Telephone Encounter (Signed)
RX faxed to [facility]. Received a receipt of confirmation.

## 2015-11-07 ENCOUNTER — Encounter: Payer: Self-pay | Admitting: Pulmonary Disease

## 2015-11-07 ENCOUNTER — Ambulatory Visit (INDEPENDENT_AMBULATORY_CARE_PROVIDER_SITE_OTHER): Payer: Medicare Other | Admitting: Pulmonary Disease

## 2015-11-07 DIAGNOSIS — J019 Acute sinusitis, unspecified: Secondary | ICD-10-CM | POA: Insufficient documentation

## 2015-11-07 DIAGNOSIS — J449 Chronic obstructive pulmonary disease, unspecified: Secondary | ICD-10-CM | POA: Diagnosis not present

## 2015-11-07 DIAGNOSIS — I10 Essential (primary) hypertension: Secondary | ICD-10-CM

## 2015-11-07 DIAGNOSIS — J0100 Acute maxillary sinusitis, unspecified: Secondary | ICD-10-CM

## 2015-11-07 MED ORDER — UMECLIDINIUM-VILANTEROL 62.5-25 MCG/INH IN AEPB: 1.0000 | INHALATION_SPRAY | Freq: Every day | RESPIRATORY_TRACT | 3 refills | Status: AC

## 2015-11-07 MED ORDER — LEVOFLOXACIN 500 MG PO TABS: 500.0000 mg | ORAL_TABLET | Freq: Every day | ORAL | 0 refills | Status: DC

## 2015-11-07 NOTE — Patient Instructions (Signed)
For sinusitis, take Levaquin 500 milligrams daily for 7 days Take store brand Sudafed daily for 7 days  If symptoms persist-call us and we can pursue ENT consult  Rx on [facility] will be sent to mail-order pharmacy  Pulmonary rehabilitation referral

## 2015-11-07 NOTE — Assessment & Plan Note (Signed)
Rx on [facility] will be sent to mail-order pharmacy  Pulmonary rehabilitation referral

## 2015-11-07 NOTE — Assessment & Plan Note (Signed)
I suggested that be changed from captopril to ARB due to her constant throat clearing-but she feels that she can live with this and would defer to PCP

## 2015-11-07 NOTE — Progress Notes (Signed)
   Subjective:    Patient ID: Beth [name], female    DOB: Jun 17, 1940, 75 y.o.   MRN: [id number]  HPI  75 year-old ex-smoker, quit in 2009, For follow-up of COPD   11/07/2015  Chief Complaint  Patient presents with  . Follow-up    pt states breathing has improved since last ov. pt c/o sob with exertion. pt denies any cough, wheezing or cp/tightness.    She reports chronic throat clearing which she attributes to sinus drip, has occasional heartburn. Medication review shows captopril-but she is hesitant to stop this She also reports right facial and sinus pain with constant nasal drainage-review of CT scan from 2010 shows maxillary polyps  On her last visit, she was started on anoro which is really helped her breathing. She has not been taking this daily and we discussed compliance issues.[email or web address]   Significant tests/ events  CT angio chest 07/2015 >> no pulmonary embolism, no emphysema  2010 head CT >> polyps or mucous retention cysts  seen within the maxillary sinuses bilaterally  PFTs 09/2015>> severe airway obstruction with FEV1 40%, improves to 47% with albuterol, DLCO decreased to 45%    Review of Systems Patient denies significant dyspnea,cough, hemoptysis,  chest pain, palpitations, pedal edema, orthopnea, paroxysmal nocturnal dyspnea, lightheadedness, nausea, vomiting, abdominal or  leg pains      Objective:   Physical Exam   Gen. Pleasant, obese, in no distress ENT - no lesions, no post nasal drip Neck: No JVD, no thyromegaly, no carotid bruits Lungs: no use of accessory muscles, no dullness to percussion, decreased without rales or rhonchi  Cardiovascular: Rhythm regular, heart sounds  normal, no murmurs or gallops, no peripheral edema Musculoskeletal: No deformities, no cyanosis or clubbing , no tremors      Assessment & Plan:

## 2015-11-07 NOTE — Assessment & Plan Note (Signed)
For sinusitis, take Levaquin 500 milligrams daily for 7 days Take store brand Sudafed daily for 7 days  If symptoms persist-call us and we can pursue ENT consult

## 2015-11-15 DIAGNOSIS — R410 Disorientation, unspecified: Secondary | ICD-10-CM | POA: Diagnosis not present

## 2015-11-15 DIAGNOSIS — I959 Hypotension, unspecified: Secondary | ICD-10-CM | POA: Diagnosis not present

## 2015-11-15 DIAGNOSIS — J449 Chronic obstructive pulmonary disease, unspecified: Secondary | ICD-10-CM | POA: Diagnosis not present

## 2015-11-16 ENCOUNTER — Encounter (HOSPITAL_COMMUNITY)
Admission: RE | Admit: 2015-11-16 | Discharge: 2015-11-16 | Disposition: A | Discharge status: Home / Self Care | Payer: Medicare Other | Source: Ambulatory Visit | Attending: Pulmonary Disease | Admitting: Pulmonary Disease

## 2015-11-16 ENCOUNTER — Encounter (HOSPITAL_COMMUNITY): Payer: Self-pay

## 2015-11-16 VITALS — BP 147/57 | HR 66 | Resp 18 | Ht 64.25 in | Wt 243.6 lb

## 2015-11-16 DIAGNOSIS — Z9981 Dependence on supplemental oxygen: Secondary | ICD-10-CM | POA: Insufficient documentation

## 2015-11-16 DIAGNOSIS — J449 Chronic obstructive pulmonary disease, unspecified: Secondary | ICD-10-CM | POA: Diagnosis not present

## 2015-11-16 NOTE — Progress Notes (Signed)
Beth [name] 75 y.o. female Pulmonary Rehab Orientation Note [phone] Patient arrived today in Cardiac and Pulmonary Rehab for orientation to Pulmonary Rehab. She was transported from [employer] via wheel chair by her husband. She has not been prescribed oxygen for home or portable use. Color good, skin warm and dry. Patient is oriented to time and place. Patient's medical history, psychosocial health, and medications reviewed. Psychosocial assessment reveals pt lives with their spouse. She is unemployed. Pt reports her stress level is low. Areas of stress/anxiety include Health. Pt does not exhibit signs of depression. PHQ2/9 score 0/na. Pt shows good  coping skills with positive outlook . She was offered emotional support and reassurance. Will continue to monitor and evaluate progress toward psychosocial goal(s) of managing the psychosocial component of being o2 dependent for exertional activities. Physical assessment reveals heart rate is normal, breath sounds clear to auscultation, no wheezes, rales, or rhonchi, diminished. Grip strength equal, strong. Distal pulses palpable. Patient reports she does take medications as prescribed. Patient states she follows a Regular diet. The patient reports no specific efforts to gain or lose weight. Patient's weight will be monitored closely. Demonstration and practice of PLB using pulse oximeter. Patient able to return demonstration satisfactorily. Safety and hand hygiene in the exercise area reviewed with patient. Patient voices understanding of the information reviewed. Department expectations discussed with patient and achievable goals were set. The patient shows enthusiasm about attending the program and we look forward to working with this nice lady. The patient completed her six min walktest today during her orientation appointment related to a significant decrease in her check-in O2 saturations. She also was introduced to strength training through exercise  band review. Patient was able to return demonstration of band exercise over a 10 minute period. As part of the assessment, she was placed on the nustep to determine equipment settings and continuation of oxygen requirement assessment. Her total aerobic exercise time on the nustep was 15 min and her total exercise assessment time was 31 min. She will meet with the pulmonary rehab medical director on 11/14 and begin her exercise session 11/14 post MD face to face.   45 minutes was spent on a variety of activities such as assessment of the patient, obtaining baseline data including height, weight, BMI, and grip strength, verifying medical history, allergies, and current medications, and teaching patient strategies for performing tasks with less respiratory effort with emphasis on pursed lip breathing.

## 2015-11-20 ENCOUNTER — Telehealth: Payer: Self-pay | Admitting: Pulmonary Disease

## 2015-11-20 DIAGNOSIS — J449 Chronic obstructive pulmonary disease, unspecified: Secondary | ICD-10-CM

## 2015-11-20 DIAGNOSIS — J0100 Acute maxillary sinusitis, unspecified: Secondary | ICD-10-CM

## 2015-11-20 NOTE — Progress Notes (Signed)
Pulmonary Individual Treatment Plan  Patient Details  Name: Beth [name] MRN: [id number] Date of Birth: 1940-02-23 Referring Provider:   Flowsheet Row PULMONARY REHAB COPD ORIENTATION from 11/16/2015 in [place]  Referring Provider  Dr. Elsworth Soho      Initial Encounter Date:  Flowsheet Row PULMONARY REHAB COPD ORIENTATION from 11/16/2015 in [place]  Date  11/20/15  Referring Provider  Dr. Elsworth Soho      Visit Diagnosis: Chronic obstructive pulmonary disease, unspecified COPD type ([place])  Patient's Home Medications on Admission:   Current Outpatient Prescriptions:  .  albuterol (PROVENTIL HFA;VENTOLIN HFA) 108 (90 Base) MCG/ACT inhaler, Inhale 2 puffs into the lungs every 4 (four) hours as needed for wheezing or shortness of breath., Disp: 1 Inhaler, Rfl: 5 .  aspirin EC 81 MG tablet, Take 81 mg by mouth daily., Disp: , Rfl:  .  atenolol (TENORMIN) 50 MG tablet, TAKE 1/2 TABLET TWICE A DAY, Disp: , Rfl:  .  captopril (CAPOTEN) 50 MG tablet, Take 50 mg by mouth 2 (two) times daily., Disp: , Rfl:  .  Cholecalciferol (VITAMIN D3) 2000 units capsule, 1 tablet, Disp: , Rfl:  .  clonazePAM (KLONOPIN) 2 MG tablet, Take 1 tablet (2 mg total) by mouth at bedtime., Disp: 90 tablet, Rfl: 1 .  fexofenadine (ALLEGRA) 180 MG tablet, Take 180 mg by mouth daily. , Disp: , Rfl:  .  fluticasone (FLONASE) 50 MCG/ACT nasal spray, Place 2 sprays into the nose daily as needed for allergies. Spray twice daily as needed, Disp: , Rfl:  .  guaiFENesin (MUCINEX) 600 MG 12 hr tablet, Take 1,200 mg by mouth 2 (two) times daily as needed for cough. , Disp: , Rfl:  .  KEPPRA 750 MG tablet, Take 1 tablet (750 mg total) by mouth 2 (two) times daily., Disp: 180 tablet, Rfl: 3 .  LEXAPRO 20 MG tablet, Take 1 tablet (20 mg total) by mouth daily. Morning dosage (Patient taking differently: Take 20 mg by mouth daily. ), Disp: 90 tablet, Rfl: 3 .  meloxicam (MOBIC)  15 MG tablet, 1 tablet, Disp: , Rfl:  .  simvastatin (ZOCOR) 10 MG tablet, Take 10 mg by mouth at bedtime., Disp: , Rfl:  .  triamterene-hydrochlorothiazide (MAXZIDE-25) 37.5-25 MG tablet, Take 1 tablet by mouth daily., Disp: , Rfl:  .  umeclidinium-vilanterol (ANORO ELLIPTA) 62.5-25 MCG/INH AEPB, 1 puff, Disp: , Rfl:   Past Medical History: Past Medical History:  Diagnosis Date  . BRCA2 positive 2017  . Diverticulosis   . Hearing loss   . HTN (hypertension)   . Memory loss   . Seizure disorder, complex partial, 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[place]) seizure free for one year  . Seizures ([place])   . 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   . Vestibulitis of ear     Tobacco Use: History  Smoking Status  . Former Smoker  . Packs/day: 1.00  . Years: 41.00  . Quit date: 01/13/2006  Smokeless Tobacco  . Never Used    Labs: Recent Review Flowsheet Data    Labs for ITP Cardiac and Pulmonary Rehab Latest Ref Rng & Units 04/16/2007 07/05/2008   Cholestrol - 146 ATP III CLASSIFICATION: <200     mg/dL   Desirable 200-239  mg/dL   Borderline High >=240    mg/dL   High -   LDLCALC - 84 Total Cholesterol/HDL:CHD Risk Coronary Heart Disease Risk Table Men   Women 1/2 Average Risk  3.4   3.3 -   HDL - 47 -   Trlycerides - 75 -   TCO2 0 - 100 mmol/L - 23      Capillary Blood Glucose: Lab Results  Component Value Date   GLUCAP 117 (H) 06/10/2008     ADL UCSD:   Pulmonary Function Assessment:     Pulmonary Function Assessment - 11/16/15 1555      Breath   Bilateral Breath Sounds Decreased   Shortness of Breath Limiting activity      Exercise Target Goals: Date: 11/20/15  Exercise Program Goal: Individual exercise prescription set with THRR, safety & activity barriers. Participant demonstrates ability to understand and report RPE using BORG scale, to self-measure pulse accurately, and to acknowledge the importance of the exercise prescription.  Exercise Prescription  Goal: Starting with aerobic activity 30 plus minutes a day, 3 days per week for initial exercise prescription. Provide home exercise prescription and guidelines that participant acknowledges understanding prior to discharge.  Activity Barriers & Risk Stratification:   6 Minute Walk:     6 Minute Walk    Row Name 11/20/15 0908         6 Minute Walk   Phase Initial     Distance 810 feet     Walk Time -  4 minutes 30 seconds     # of Rest Breaks 1  1 minute 30 seconds     RPE 14     Perceived Dyspnea  2     Symptoms Yes (comment)     Comments leg fatigue, ankle pain     Resting HR 69 bpm     Resting BP 130/80     Max Ex. HR 98 bpm     Max Ex. BP 148/80       Interval HR   Baseline HR 69     1 Minute HR 85     2 Minute HR 92     3 Minute HR 98     4 Minute HR 77     5 Minute HR 90     6 Minute HR 96     2 Minute Post HR 77     Interval Heart Rate? Yes       Interval Oxygen   Interval Oxygen? Yes     Baseline Oxygen Saturation % 90 %     Baseline Liters of Oxygen 0 L     1 Minute Oxygen Saturation % 85 %     1 Minute Liters of Oxygen 0 L     2 Minute Oxygen Saturation % 77 %     2 Minute Liters of Oxygen 0 L     3 Minute Oxygen Saturation % 74 %     3 Minute Liters of Oxygen 2 L     4 Minute Oxygen Saturation % 94 %     4 Minute Liters of Oxygen 4 L     5 Minute Oxygen Saturation % 94 %     5 Minute Liters of Oxygen 4 L     6 Minute Oxygen Saturation % 87 %     6 Minute Liters of Oxygen 4 L     2 Minute Post Oxygen Saturation % 94 %     2 Minute Post Liters of Oxygen 4 L        Initial Exercise Prescription:     Initial Exercise Prescription - 11/20/15 1600      Date of Initial Exercise  RX and Referring Provider   Date 11/20/15   Referring Provider Dr. Elsworth Soho     Oxygen   Oxygen Continuous   Liters 4     Recumbant Bike   Level 3   Minutes 17     NuStep   Level 2   Minutes 17   METs 1.5     Track   Laps 5   Minutes 17     Prescription  Details   Frequency (times per week) 2   Duration Progress to 45 minutes of aerobic exercise without signs/symptoms of physical distress     Intensity   THRR 40-80% of Max Heartrate 58-117   Ratings of Perceived Exertion 11-13   Perceived Dyspnea 0-4     Progression   Progression Continue progressive overload as per policy without signs/symptoms or physical distress.     Resistance Training   Training Prescription Yes   Weight green bands   Reps 10-12      Perform Capillary Blood Glucose checks as needed.  Exercise Prescription Changes:   Exercise Comments:   Discharge Exercise Prescription (Final Exercise Prescription Changes):    Nutrition:  Target Goals: Understanding of nutrition guidelines, daily intake of sodium <1548m, cholesterol <2087m calories 30% from fat and 7% or less from saturated fats, daily to have 5 or more servings of fruits and vegetables.  Biometrics:     Pre Biometrics - 11/16/15 1555      Pre Biometrics   Grip Strength 28 kg       Nutrition Therapy Plan and Nutrition Goals:   Nutrition Discharge: Rate Your Plate Scores:   Psychosocial: Target Goals: Acknowledge presence or absence of depression, maximize coping skills, provide positive support system. Participant is able to verbalize types and ability to use techniques and skills needed for reducing stress and depression.  Initial Review & Psychosocial Screening:     Initial Psych Review & Screening - 11/16/15 1557      Initial Review   Current issues with History of Depression     Family Dynamics   Good Support System? Yes     Barriers   Psychosocial barriers to participate in program There are no identifiable barriers or psychosocial needs.     Screening Interventions   Interventions Encouraged to exercise      Quality of Life Scores:   PHQ-9: Recent Review Flowsheet Data    [facility]2015   Decreased Interest 0   Down, Depressed, Hopeless  0   PHQ - 2 Score 0      Psychosocial Evaluation and Intervention:     Psychosocial Evaluation - 11/16/15 1558      Psychosocial Evaluation & Interventions   Interventions Encouraged to exercise with the program and follow exercise prescription      Psychosocial Re-Evaluation:  Education: Education Goals: Education classes will be provided on a weekly basis, covering required topics. Participant will state understanding/return demonstration of topics presented.  Learning Barriers/Preferences:   Education Topics: Risk Factor Reduction:  -Group instruction that is supported by a PowerPoint presentation. Instructor discusses the definition of a risk factor, different risk factors for pulmonary disease, and how the heart and lungs work together.     Nutrition for Pulmonary Patient:  -Group instruction provided by PowerPoint slides, verbal discussion, and written materials to support subject matter. The instructor gives an explanation and review of healthy diet recommendations, which includes a discussion on weight management, recommendations for fruit and vegetable consumption, as well as protein,  fluid, caffeine, fiber, sodium, sugar, and alcohol. Tips for eating when patients are short of breath are discussed.   Pursed Lip Breathing:  -Group instruction that is supported by demonstration and informational handouts. Instructor discusses the benefits of pursed lip and diaphragmatic breathing and detailed demonstration on how to preform both.     Oxygen Safety:  -Group instruction provided by PowerPoint, verbal discussion, and written material to support subject matter. There is an overview of "What is Oxygen" and "Why do we need it".  Instructor also reviews how to create a safe environment for oxygen use, the importance of using oxygen as prescribed, and the risks of noncompliance. There is a brief discussion on traveling with oxygen and resources the patient may  utilize.   Oxygen Equipment:  -Group instruction provided by [employer] Staff utilizing handouts, written materials, and equipment demonstrations.   Signs and Symptoms:  -Group instruction provided by written material and verbal discussion to support subject matter. Warning signs and symptoms of infection, stroke, and heart attack are reviewed and when to call the physician/911 reinforced. Tips for preventing the spread of infection discussed.   Advanced Directives:  -Group instruction provided by verbal instruction and written material to support subject matter. Instructor reviews Advanced Directive laws and proper instruction for filling out document.   Pulmonary Video:  -Group video education that reviews the importance of medication and oxygen compliance, exercise, good nutrition, pulmonary hygiene, and pursed lip and diaphragmatic breathing for the pulmonary patient.   Exercise for the Pulmonary Patient:  -Group instruction that is supported by a PowerPoint presentation. Instructor discusses benefits of exercise, core components of exercise, frequency, duration, and intensity of an exercise routine, importance of utilizing pulse oximetry during exercise, safety while exercising, and options of places to exercise outside of rehab.     Pulmonary Medications:  -Verbally interactive group education provided by instructor with focus on inhaled medications and proper administration.   Anatomy and Physiology of the Respiratory System and Intimacy:  -Group instruction provided by PowerPoint, verbal discussion, and written material to support subject matter. Instructor reviews respiratory cycle and anatomical components of the respiratory system and their functions. Instructor also reviews differences in obstructive and restrictive respiratory diseases with examples of each. Intimacy, Sex, and Sexuality differences are reviewed with a discussion on how relationships can change when diagnosed  with pulmonary disease. Common sexual concerns are reviewed.   Knowledge Questionnaire Score:   Core Components/Risk Factors/Patient Goals at Admission:     Personal Goals and Risk Factors at Admission - 11/16/15 1555      Core Components/Risk Factors/Patient Goals on Admission    Weight Management Obesity;Yes   Intervention Obesity: Provide education and appropriate resources to help participant work on and attain dietary goals.   Expected Outcomes Understanding recommendations for meals to include 15-35% energy as protein, 25-35% energy from fat, 35-60% energy from carbohydrates, less than 229m of dietary cholesterol, 20-35 gm of total fiber daily;Understanding of distribution of calorie intake throughout the day with the consumption of 4-5 meals/snacks;Short Term: Continue to assess and modify interventions until short term weight is achieved   Sedentary Yes   Intervention Provide advice, education, support and counseling about physical activity/exercise needs.;Develop an individualized exercise prescription for aerobic and resistive training based on initial evaluation findings, risk stratification, comorbidities and participant's personal goals.   Expected Outcomes Achievement of increased cardiorespiratory fitness and enhanced flexibility, muscular endurance and strength shown through measurements of functional capacity and personal statement of participant.  Increase Strength and Stamina Yes   Intervention Provide advice, education, support and counseling about physical activity/exercise needs.;Develop an individualized exercise prescription for aerobic and resistive training based on initial evaluation findings, risk stratification, comorbidities and participant's personal goals.   Expected Outcomes Achievement of increased cardiorespiratory fitness and enhanced flexibility, muscular endurance and strength shown through measurements of functional capacity and personal statement of  participant.   Improve shortness of breath with ADL's Yes   Intervention Provide education, individualized exercise plan and daily activity instruction to help decrease symptoms of SOB with activities of daily living.   Expected Outcomes Short Term: Achieves a reduction of symptoms when performing activities of daily living.   Develop more efficient breathing techniques such as purse lipped breathing and diaphragmatic breathing; and practicing self-pacing with activity Yes   Intervention Provide education, demonstration and support about specific breathing techniuqes utilized for more efficient breathing. Include techniques such as pursed lipped breathing, diaphragmatic breathing and self-pacing activity.   Expected Outcomes Short Term: Participant will be able to demonstrate and use breathing techniques as needed throughout daily activities.      Core Components/Risk Factors/Patient Goals Review:    Core Components/Risk Factors/Patient Goals at Discharge (Final Review):    ITP Comments:   Comments:

## 2015-11-20 NOTE — Telephone Encounter (Signed)
Received a note from pulmonary rehabilitation -'With simple exertion she dropped to 76% on RA and with her 6 min walk test she got down to 52'  Please send prescription to DME for portable O2-have them titrate to saturation 88% and above  -Please arrange follow-up visit in one to 2 weeks with TP/me

## 2015-11-22 NOTE — Telephone Encounter (Signed)
Spoke with pt's spouse (dpr on file), aware of recs.   Referral placed.  Nothing further needed.

## 2015-11-22 NOTE — Telephone Encounter (Signed)
Okay for ENT referral-they can advise about further antibiotics

## 2015-11-22 NOTE — Telephone Encounter (Signed)
Spoke with pt and made her aware of RA recommendation. Pt states she concerned that her sinus infection is playing a part in her oxygen is dropping. Pt was started on abx on 11-07-15. Pt is requesting another round of abx & a ENT ref as her symptoms improved with abx but have since returned. Pt c/o head/nasal congestion, throat clearing, sinus pressure & right ear pain. Pt agreed to proceed with 02 order, order has been placed. RA please advise. Thanks.

## 2015-11-26 DIAGNOSIS — J329 Chronic sinusitis, unspecified: Secondary | ICD-10-CM | POA: Diagnosis not present

## 2015-11-26 DIAGNOSIS — T7840XA Allergy, unspecified, initial encounter: Secondary | ICD-10-CM | POA: Diagnosis not present

## 2015-11-27 ENCOUNTER — Encounter (HOSPITAL_COMMUNITY)
Admission: RE | Admit: 2015-11-27 | Discharge: 2015-11-27 | Disposition: A | Discharge status: Home / Self Care | Payer: Medicare Other | Source: Ambulatory Visit | Attending: Pulmonary Disease | Admitting: Pulmonary Disease

## 2015-11-27 DIAGNOSIS — J449 Chronic obstructive pulmonary disease, unspecified: Secondary | ICD-10-CM

## 2015-11-27 NOTE — Progress Notes (Signed)
Pulmonary Rehab Initial Visit  S: feels better with inhalers with O2 at 2L per minutes.  No limitation with ambulation with O2.  O: Chronically ill appearing female in NAD. HEENT: Bee/AT, PERRL, EOM-I and MMM. Hrt: RRR, Nl S1/S2, -M/R/G. Lung: Distant but clear. Abd: Soft, NT, ND and +BS. Ext: -edema and -tenderness  A/P:  75 year old with COPD that is O2 dependent who has some limitation on activity and would benefit from pulmonary rehab.  Will begin with the program, continue O2 and continue bronchodilators.  Will see patient again in 30 days.  Rush Farmer, M.D. [facility] Pulmonary/Critical Care Medicine. Pager: [phone]. After hours pager: [phone].

## 2015-11-28 ENCOUNTER — Telehealth: Payer: Self-pay

## 2015-11-28 DIAGNOSIS — Z1509 Genetic susceptibility to other malignant neoplasm: Principal | ICD-10-CM

## 2015-11-28 DIAGNOSIS — J449 Chronic obstructive pulmonary disease, unspecified: Secondary | ICD-10-CM | POA: Diagnosis not present

## 2015-11-28 DIAGNOSIS — R7301 Impaired fasting glucose: Secondary | ICD-10-CM | POA: Diagnosis not present

## 2015-11-28 DIAGNOSIS — Z1501 Genetic susceptibility to malignant neoplasm of breast: Secondary | ICD-10-CM

## 2015-11-28 DIAGNOSIS — Z6841 Body Mass Index (BMI) 40.0 and over, adult: Secondary | ICD-10-CM | POA: Diagnosis not present

## 2015-11-28 DIAGNOSIS — E6609 Other obesity due to excess calories: Secondary | ICD-10-CM | POA: Diagnosis not present

## 2015-11-28 DIAGNOSIS — I10 Essential (primary) hypertension: Secondary | ICD-10-CM | POA: Diagnosis not present

## 2015-11-28 NOTE — Telephone Encounter (Signed)
Spoke with patient. Advised patient Dr.Silva recommends that she be seen in the office for 6 months follow up PUS and CA125 in February of 2018. Patient has pulmonary therapy on Tuesday and Thursday. Requesting to have PUS and CA125 in March after she is finished with pulmonary therapy. Appointment for PUS and CA125 scheduled for 03/27/2016 at 1:30 pm with 2 pm consult with Dr.Silva. Patient is agreeable to date and time. Orders placed for precert.  Routing to provider for final review. Patient agreeable to disposition. Will close encounter.

## 2015-11-29 ENCOUNTER — Encounter (HOSPITAL_COMMUNITY)
Admission: RE | Admit: 2015-11-29 | Discharge: 2015-11-29 | Disposition: A | Discharge status: Home / Self Care | Payer: Medicare Other | Source: Ambulatory Visit | Attending: Pulmonary Disease | Admitting: Pulmonary Disease

## 2015-11-29 VITALS — Wt 249.1 lb

## 2015-11-29 DIAGNOSIS — Z9981 Dependence on supplemental oxygen: Secondary | ICD-10-CM | POA: Diagnosis not present

## 2015-11-29 DIAGNOSIS — J449 Chronic obstructive pulmonary disease, unspecified: Secondary | ICD-10-CM

## 2015-11-29 NOTE — Progress Notes (Signed)
Daily Session Note  Patient Details  Name: Beth [name] MRN: [id number] Date of Birth: May 08, 1940 Referring Provider:   Flowsheet Row PULMONARY REHAB COPD ORIENTATION from 11/16/2015 in [place]  Referring Provider  Dr. Elsworth Soho      Encounter Date: 11/29/2015  Check In:     Session Check In - 11/29/15 1330      Check-In   Location [facility]   Staff Present Rosebud Poles, RN, BSN;Molly diVincenzo, MS, ACSM RCEP, Exercise Physiologist;Lisa Ysidro Evert, RN;Mauri Tolen Rollene Rotunda, RN, BSN   Supervising physician immediately available to respond to emergencies [facility] immediately available   Physician(s) Dr. Grandville Silos   Medication changes reported     No   Fall or balance concerns reported    No   Warm-up and Cool-down Performed as group-led instruction   Resistance Training Performed Yes   VAD Patient? No     Pain Assessment   Currently in Pain? No/denies   Multiple Pain Sites No      Capillary Blood Glucose: No results found for this or any previous visit (from the past 24 hour(s)).      Exercise Prescription Changes - 11/29/15 1600      Response to Exercise   Blood Pressure (Admit) 120/80   Blood Pressure (Exercise) 150/76   Blood Pressure (Exit) 120/72   Heart Rate (Admit) 65 bpm   Heart Rate (Exercise) 84 bpm   Heart Rate (Exit) 75 bpm   Oxygen Saturation (Admit) 86 %   Oxygen Saturation (Exercise) 89 %   Oxygen Saturation (Exit) 95 %   Rating of Perceived Exertion (Exercise) 15   Perceived Dyspnea (Exercise) 2   Duration Progress to 45 minutes of aerobic exercise without signs/symptoms of physical distress   Intensity --  40-80% HRR     Progression   Progression Continue to progress workloads to maintain intensity without signs/symptoms of physical distress.     Resistance Training   Training Prescription Yes   Weight green bands  10 min   Reps 10-12     Oxygen   Oxygen Continuous   Liters 4     Recumbant Bike   Level 1   Minutes 17     Track   Laps 5   Minutes 17     Goals Met:  Exercise tolerated well Queuing for purse lip breathing No report of cardiac concerns or symptoms Strength training completed today  Goals Unmet:  Not Applicable  Comments: Service time is from 1330 to 1530   Dr. Rush Farmer is Medical Director for Pulmonary Rehab at [facility].

## 2015-12-04 ENCOUNTER — Encounter (HOSPITAL_COMMUNITY): Payer: Medicare Other

## 2015-12-04 ENCOUNTER — Encounter (HOSPITAL_COMMUNITY): Payer: Self-pay | Admitting: *Deleted

## 2015-12-06 ENCOUNTER — Encounter (HOSPITAL_COMMUNITY): Payer: Medicare Other

## 2015-12-10 ENCOUNTER — Ambulatory Visit (INDEPENDENT_AMBULATORY_CARE_PROVIDER_SITE_OTHER): Payer: Medicare Other | Admitting: Adult Health

## 2015-12-10 ENCOUNTER — Encounter: Payer: Self-pay | Admitting: Adult Health

## 2015-12-10 DIAGNOSIS — J9611 Chronic respiratory failure with hypoxia: Secondary | ICD-10-CM

## 2015-12-10 DIAGNOSIS — J449 Chronic obstructive pulmonary disease, unspecified: Secondary | ICD-10-CM | POA: Diagnosis not present

## 2015-12-10 NOTE — Patient Instructions (Addendum)
Continue on ANORO daily  Continue with pulmonary rehab.  Saline nasal spray and gel As needed   Continue on oxygen 2l/m . (oxygen 4l/m with exercise at pulmonary rehab) .  Follow up with Dr. Elsworth Soho  In 3 months as planned and As needed

## 2015-12-10 NOTE — Progress Notes (Signed)
Subjective:    Patient ID: Beth [name], female    DOB: [birth year], 75 y.o.   MRN: [id number]  HPI 75 yo female former smoker followed for COPD   Significant tests/ events  CT angio chest 07/2015 >>no pulmonary embolism, no emphysema  2010 head CT >> polyps or mucous retention cysts  seen within the maxillary sinuses bilaterally  PFTs 09/2015>>severe airway obstruction with FEV1 40%, improves to 47% with albuterol,DLCO decreased to 45%  12/11/2015 Follow up : COPD  Pt presents for follow up for COPD and chronic resp failure on O2 . She has been going to pulmonary rehab. 6 min walk test showed she drops her O2 sats with exercise. She is on 2l/m 24/7 and t and 4l/m with exercise. Says she is doing well on O2 .feels she can do more. Feels it really helps.  She denies flare of cough , congestion, fever or hemoptysis .    Past Medical History:  Diagnosis Date  . BRCA2 positive 2017  . Diverticulosis   . Hearing loss   . HTN (hypertension)   . Memory loss   . Seizure disorder, complex partial, 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[place]) seizure free for one year  . Seizures ([place])   . 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   . Vestibulitis of ear    Current Outpatient Prescriptions on File Prior to Visit  Medication Sig Dispense Refill  . albuterol (PROVENTIL HFA;VENTOLIN HFA) 108 (90 Base) MCG/ACT inhaler Inhale 2 puffs into the lungs every 4 (four) hours as needed for wheezing or shortness of breath. 1 Inhaler 5  . aspirin EC 81 MG tablet Take 81 mg by mouth daily.    Marland Kitchen atenolol (TENORMIN) 50 MG tablet TAKE 1/2 TABLET TWICE A DAY    . captopril (CAPOTEN) 50 MG tablet Take 50 mg by mouth 2 (two) times daily.    . Cholecalciferol (VITAMIN D3) 2000 units capsule 1 tablet    . clonazePAM (KLONOPIN) 2 MG tablet Take 1 tablet (2 mg total) by mouth at bedtime. 90 tablet 1  . fexofenadine (ALLEGRA) 180 MG tablet Take 180 mg by mouth daily.     . fluticasone (FLONASE) 50 MCG/ACT nasal spray  Place 2 sprays into the nose daily as needed for allergies. Spray twice daily as needed    . guaiFENesin (MUCINEX) 600 MG 12 hr tablet Take 1,200 mg by mouth 2 (two) times daily as needed for cough.     Marland Kitchen KEPPRA 750 MG tablet Take 1 tablet (750 mg total) by mouth 2 (two) times daily. 180 tablet 3  . LEXAPRO 20 MG tablet Take 1 tablet (20 mg total) by mouth daily. Morning dosage (Patient taking differently: Take 20 mg by mouth daily. ) 90 tablet 3  . meloxicam (MOBIC) 15 MG tablet 1 tablet    . simvastatin (ZOCOR) 10 MG tablet Take 10 mg by mouth at bedtime.    . triamterene-hydrochlorothiazide (MAXZIDE-25) 37.5-25 MG tablet Take 1 tablet by mouth daily.    Marland Kitchen umeclidinium-vilanterol (ANORO ELLIPTA) 62.5-25 MCG/INH AEPB 1 puff     No current facility-administered medications on file prior to visit.      Review of Systems Constitutional:   No  weight loss, night sweats,  Fevers, chills,  +fatigue, or  lassitude.  HEENT:   No headaches,  Difficulty swallowing,  Tooth/dental problems, or  Sore throat,                No sneezing, itching, ear ache, nasal congestion,  post nasal drip,   CV:  No chest pain,  Orthopnea, PND, swelling in lower extremities, anasarca, dizziness, palpitations, syncope.   GI  No heartburn, indigestion, abdominal pain, nausea, vomiting, diarrhea, change in bowel habits, loss of appetite, bloody stools.   Resp:  No chest wall deformity  Skin: no rash or lesions.  GU: no dysuria, change in color of urine, no urgency or frequency.  No flank pain, no hematuria   MS:  No joint pain or swelling.  No decreased range of motion.  No back pain.  Psych:  No change in mood or affect. No depression or anxiety.  No memory loss.         Objective:   Physical Exam  Vitals:   12/10/15 1644  BP: 122/62  Pulse: 77  SpO2: 94%  Weight: 248 lb 3.2 oz (112.6 kg)  Height: '5\' 4"'  (1.626 m)   GEN: A/Ox3; pleasant , NAD elderly on O2    HEENT:  [place]/AT,  EACs-clear, TMs-wnl,  NOSE-clear, THROAT-clear, no lesions, no postnasal drip or exudate noted.   NECK:  Supple w/ fair ROM; no JVD; normal carotid impulses w/o bruits; no thyromegaly or nodules palpated; no lymphadenopathy.    RESP  Decreased BS in bases w/o, wheezes/ rales/ or rhonchi. no accessory muscle use, no dullness to percussion  CARD:  RRR, no m/r/g  , no peripheral edema, pulses intact, no cyanosis or clubbing.  GI:   Soft & nt; nml bowel sounds; no organomegaly or masses detected.   Musco: Warm bil, no deformities or joint swelling noted.   Neuro: alert, no focal deficits noted.    Skin: Warm, no lesions or rashes  [name] NP-C  [facility]  12/11/2015       Assessment & Plan:

## 2015-12-11 ENCOUNTER — Encounter (HOSPITAL_COMMUNITY)
Admission: RE | Admit: 2015-12-11 | Discharge: 2015-12-11 | Disposition: A | Discharge status: Home / Self Care | Payer: Medicare Other | Source: Ambulatory Visit | Attending: Pulmonary Disease | Admitting: Pulmonary Disease

## 2015-12-11 VITALS — Wt 251.3 lb

## 2015-12-11 DIAGNOSIS — J449 Chronic obstructive pulmonary disease, unspecified: Secondary | ICD-10-CM | POA: Diagnosis not present

## 2015-12-11 DIAGNOSIS — Z9981 Dependence on supplemental oxygen: Secondary | ICD-10-CM | POA: Diagnosis not present

## 2015-12-11 DIAGNOSIS — J9610 Chronic respiratory failure, unspecified whether with hypoxia or hypercapnia: Secondary | ICD-10-CM | POA: Insufficient documentation

## 2015-12-11 NOTE — Assessment & Plan Note (Signed)
Doing well on O2 , feels it is really helping with exercise in pulmoanry rehab  Plan  Patient Instructions  Continue on [facility] daily  Continue with pulmonary rehab.  Saline nasal spray and gel As needed   Continue on oxygen 2l/m . (oxygen 4l/m with exercise at pulmonary rehab) .  Follow up with Dr. Elsworth Soho  In 3 months as planned and As needed

## 2015-12-11 NOTE — Assessment & Plan Note (Signed)
Compensated without flare   Plan  Patient Instructions  Continue on ANORO daily  Continue with pulmonary rehab.  Saline nasal spray and gel As needed   Continue on oxygen 2l/m . (oxygen 4l/m with exercise at pulmonary rehab) .  Follow up with Dr. Elsworth Soho  In 3 months as planned and As needed

## 2015-12-11 NOTE — Progress Notes (Signed)
Daily Session Note  Patient Details  Name: Beth [name] MRN: [id number] Date of Birth: 1940-07-28 Referring Provider:   Flowsheet Row PULMONARY REHAB COPD ORIENTATION from 11/16/2015 in [place]  Referring Provider  Dr. Elsworth Soho      Encounter Date: 12/11/2015  Check In:     Session Check In - 12/11/15 1613      Check-In   Location [facility]   Staff Present Trish Fountain, RN, BSN;Molly diVincenzo, MS, ACSM RCEP, Exercise Physiologist;Joan Leonia Reeves, RN, Roque Cash, RN   Supervising physician immediately available to respond to emergencies [facility] immediately available   Physician(s) Dr. Cathlean Sauer   Medication changes reported     No   Fall or balance concerns reported    No   Warm-up and Cool-down Performed as group-led instruction   Resistance Training Performed Yes   VAD Patient? No     Pain Assessment   Currently in Pain? No/denies   Multiple Pain Sites No      Capillary Blood Glucose: No results found for this or any previous visit (from the past 24 hour(s)).      Exercise Prescription Changes - 12/11/15 1600      Response to Exercise   Blood Pressure (Admit) 126/76   Blood Pressure (Exercise) 132/70   Blood Pressure (Exit) 122/70   Heart Rate (Admit) 64 bpm   Heart Rate (Exercise) 80 bpm   Heart Rate (Exit) 66 bpm   Oxygen Saturation (Admit) 94 %   Oxygen Saturation (Exercise) 89 %   Oxygen Saturation (Exit) 94 %   Rating of Perceived Exertion (Exercise) 15   Perceived Dyspnea (Exercise) 3   Duration Progress to 45 minutes of aerobic exercise without signs/symptoms of physical distress   Intensity THRR unchanged     Resistance Training   Training Prescription Yes   Weight green bands   Reps 10-12  10 minutes of strength training     Oxygen   Oxygen Continuous   Liters 2-4     Recumbant Bike   Level 1   Minutes 17     NuStep   Level 2   Minutes 17   METs 1.1     Track   Laps 6   Minutes 17     Goals Met:  Exercise tolerated well No report of cardiac concerns or symptoms Strength training completed today  Goals Unmet:  Not Applicable  Comments: Service time is from 1330 to 1515    Dr. Rush Farmer is Medical Director for Pulmonary Rehab at [facility].

## 2015-12-13 ENCOUNTER — Encounter (HOSPITAL_COMMUNITY)
Admission: RE | Admit: 2015-12-13 | Discharge: 2015-12-13 | Disposition: A | Discharge status: Home / Self Care | Payer: Medicare Other | Source: Ambulatory Visit | Attending: Pulmonary Disease | Admitting: Pulmonary Disease

## 2015-12-13 VITALS — Wt 254.2 lb

## 2015-12-13 DIAGNOSIS — J449 Chronic obstructive pulmonary disease, unspecified: Secondary | ICD-10-CM | POA: Diagnosis not present

## 2015-12-13 DIAGNOSIS — Z9981 Dependence on supplemental oxygen: Secondary | ICD-10-CM | POA: Diagnosis not present

## 2015-12-13 NOTE — Progress Notes (Signed)
Beth [name] presents to pulmonary rehab for her bi-weekly exercise session. I have completed her thirty day face to face review and determined that Beth [name] is on track for meeting their pulmonary rehab goals. There are no barriers identified that will prevent them from continuing their exercise in pulmonary rehab as prescribed.   Rush Farmer, M.D. [facility] Medicine. Pager: [phone]. After hours pager: [phone].

## 2015-12-13 NOTE — Progress Notes (Signed)
Daily Session Note  Patient Details  Name: Beth [name] MRN: [id number] Date of Birth: 23-Aug-1940 Referring Provider:   Flowsheet Row PULMONARY REHAB COPD ORIENTATION from 11/16/2015 in [place]  Referring Provider  Dr. Elsworth Soho      Encounter Date: 12/13/2015  Check In:     Session Check In - 12/13/15 1330      Check-In   Location [facility]   Staff Present Trish Fountain, RN, BSN;Molly diVincenzo, MS, ACSM RCEP, Exercise Physiologist;Shatona Andujar Leonia Reeves, RN, Roque Cash, RN   Supervising physician immediately available to respond to emergencies [facility] immediately available   Physician(s) Dr. Ree Kida   Medication changes reported     No   Fall or balance concerns reported    No   Warm-up and Cool-down Performed as group-led instruction   Resistance Training Performed Yes   VAD Patient? No     Pain Assessment   Currently in Pain? No/denies   Multiple Pain Sites No      Capillary Blood Glucose: No results found for this or any previous visit (from the past 24 hour(s)).      Exercise Prescription Changes - 12/13/15 1500      Response to Exercise   Blood Pressure (Admit) 112/70   Blood Pressure (Exercise) 140/80   Blood Pressure (Exit) 112/60   Heart Rate (Admit) 63 bpm   Heart Rate (Exercise) 73 bpm   Heart Rate (Exit) 70 bpm   Oxygen Saturation (Admit) 95 %   Oxygen Saturation (Exercise) 95 %   Oxygen Saturation (Exit) 96 %   Rating of Perceived Exertion (Exercise) 13   Perceived Dyspnea (Exercise) 3   Duration Progress to 45 minutes of aerobic exercise without signs/symptoms of physical distress   Intensity THRR unchanged     Progression   Progression Continue to progress workloads to maintain intensity without signs/symptoms of physical distress.     Resistance Training   Training Prescription Yes   Weight green bands   Reps 10-12  10 minutes of strength training     Interval Training   Interval  Training No     Oxygen   Oxygen Continuous   Liters 2-4     NuStep   Level 2   Minutes 17   METs 1.5     Track   Laps 4   Minutes 17     Goals Met:  Exercise tolerated well Strength training completed today  Goals Unmet:  Not Applicable  Comments: Service time is from 1330 to 1520    Dr. Rush Farmer is Medical Director for Pulmonary Rehab at [facility].

## 2015-12-13 NOTE — Progress Notes (Signed)
Reviewed & agree with plan  

## 2015-12-18 ENCOUNTER — Encounter (HOSPITAL_COMMUNITY)
Admission: RE | Admit: 2015-12-18 | Discharge: 2015-12-18 | Disposition: A | Discharge status: Home / Self Care | Payer: Medicare Other | Source: Ambulatory Visit | Attending: Pulmonary Disease | Admitting: Pulmonary Disease

## 2015-12-18 VITALS — Wt 251.8 lb

## 2015-12-18 DIAGNOSIS — Z9981 Dependence on supplemental oxygen: Secondary | ICD-10-CM | POA: Insufficient documentation

## 2015-12-18 DIAGNOSIS — J449 Chronic obstructive pulmonary disease, unspecified: Secondary | ICD-10-CM

## 2015-12-18 NOTE — Progress Notes (Signed)
Pulmonary Individual Treatment Plan  Patient Details  Name: Beth [name] MRN: [id number] Date of Birth: 1940-02-23 Referring Provider:   Flowsheet Row PULMONARY REHAB COPD ORIENTATION from 11/16/2015 in [place]  Referring Provider  Dr. Elsworth Soho      Initial Encounter Date:  Flowsheet Row PULMONARY REHAB COPD ORIENTATION from 11/16/2015 in [place]  Date  11/20/15  Referring Provider  Dr. Elsworth Soho      Visit Diagnosis: Chronic obstructive pulmonary disease, unspecified COPD type ([place])  Patient's Home Medications on Admission:   Current Outpatient Prescriptions:  .  albuterol (PROVENTIL HFA;VENTOLIN HFA) 108 (90 Base) MCG/ACT inhaler, Inhale 2 puffs into the lungs every 4 (four) hours as needed for wheezing or shortness of breath., Disp: 1 Inhaler, Rfl: 5 .  aspirin EC 81 MG tablet, Take 81 mg by mouth daily., Disp: , Rfl:  .  atenolol (TENORMIN) 50 MG tablet, TAKE 1/2 TABLET TWICE A DAY, Disp: , Rfl:  .  captopril (CAPOTEN) 50 MG tablet, Take 50 mg by mouth 2 (two) times daily., Disp: , Rfl:  .  Cholecalciferol (VITAMIN D3) 2000 units capsule, 1 tablet, Disp: , Rfl:  .  clonazePAM (KLONOPIN) 2 MG tablet, Take 1 tablet (2 mg total) by mouth at bedtime., Disp: 90 tablet, Rfl: 1 .  fexofenadine (ALLEGRA) 180 MG tablet, Take 180 mg by mouth daily. , Disp: , Rfl:  .  fluticasone (FLONASE) 50 MCG/ACT nasal spray, Place 2 sprays into the nose daily as needed for allergies. Spray twice daily as needed, Disp: , Rfl:  .  guaiFENesin (MUCINEX) 600 MG 12 hr tablet, Take 1,200 mg by mouth 2 (two) times daily as needed for cough. , Disp: , Rfl:  .  KEPPRA 750 MG tablet, Take 1 tablet (750 mg total) by mouth 2 (two) times daily., Disp: 180 tablet, Rfl: 3 .  LEXAPRO 20 MG tablet, Take 1 tablet (20 mg total) by mouth daily. Morning dosage (Patient taking differently: Take 20 mg by mouth daily. ), Disp: 90 tablet, Rfl: 3 .  meloxicam (MOBIC)  15 MG tablet, 1 tablet, Disp: , Rfl:  .  simvastatin (ZOCOR) 10 MG tablet, Take 10 mg by mouth at bedtime., Disp: , Rfl:  .  triamterene-hydrochlorothiazide (MAXZIDE-25) 37.5-25 MG tablet, Take 1 tablet by mouth daily., Disp: , Rfl:  .  umeclidinium-vilanterol (ANORO ELLIPTA) 62.5-25 MCG/INH AEPB, 1 puff, Disp: , Rfl:   Past Medical History: Past Medical History:  Diagnosis Date  . BRCA2 positive 2017  . Diverticulosis   . Hearing loss   . HTN (hypertension)   . Memory loss   . Seizure disorder, complex partial, 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[place]) seizure free for one year  . Seizures ([place])   . 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   . Vestibulitis of ear     Tobacco Use: History  Smoking Status  . Former Smoker  . Packs/day: 1.00  . Years: 41.00  . Quit date: 01/13/2006  Smokeless Tobacco  . Never Used    Labs: Recent Review Flowsheet Data    Labs for ITP Cardiac and Pulmonary Rehab Latest Ref Rng & Units 04/16/2007 07/05/2008   Cholestrol - 146 ATP III CLASSIFICATION: <200     mg/dL   Desirable 200-239  mg/dL   Borderline High >=240    mg/dL   High -   LDLCALC - 84 Total Cholesterol/HDL:CHD Risk Coronary Heart Disease Risk Table Men   Women 1/2 Average Risk  3.4   3.3 -   HDL - 47 -   Trlycerides - 75 -   TCO2 0 - 100 mmol/L - 23      Capillary Blood Glucose: Lab Results  Component Value Date   GLUCAP 117 (H) 06/10/2008     ADL UCSD:     Pulmonary Assessment Scores    Row Name 12/04/15 1701         ADL [facility]SD   ADL Phase Entry     SOB Score total 43       CAT Score   CAT Score 11        Pulmonary Function Assessment:     Pulmonary Function Assessment - 11/16/15 1555      Breath   Bilateral Breath Sounds Decreased   Shortness of Breath Limiting activity      Exercise Target Goals:    Exercise Program Goal: Individual exercise prescription set with THRR, safety & activity barriers. Participant demonstrates ability to understand and  report RPE using BORG scale, to self-measure pulse accurately, and to acknowledge the importance of the exercise prescription.  Exercise Prescription Goal: Starting with aerobic activity 30 plus minutes a day, 3 days per week for initial exercise prescription. Provide home exercise prescription and guidelines that participant acknowledges understanding prior to discharge.  Activity Barriers & Risk Stratification:   6 Minute Walk:     6 Minute Walk    Row Name 11/20/15 0908         6 Minute Walk   Phase Initial     Distance 810 feet     Walk Time -  4 minutes 30 seconds     # of Rest Breaks 1  1 minute 30 seconds     RPE 14     Perceived Dyspnea  2     Symptoms Yes (comment)     Comments leg fatigue, ankle pain     Resting HR 69 bpm     Resting BP 130/80     Max Ex. HR 98 bpm     Max Ex. BP 148/80       Interval HR   Baseline HR 69     1 Minute HR 85     2 Minute HR 92     3 Minute HR 98     4 Minute HR 77     5 Minute HR 90     6 Minute HR 96     2 Minute Post HR 77     Interval Heart Rate? Yes       Interval Oxygen   Interval Oxygen? Yes     Baseline Oxygen Saturation % 90 %     Baseline Liters of Oxygen 0 L     1 Minute Oxygen Saturation % 85 %     1 Minute Liters of Oxygen 0 L     2 Minute Oxygen Saturation % 77 %     2 Minute Liters of Oxygen 0 L     3 Minute Oxygen Saturation % 74 %     3 Minute Liters of Oxygen 2 L     4 Minute Oxygen Saturation % 94 %     4 Minute Liters of Oxygen 4 L     5 Minute Oxygen Saturation % 94 %     5 Minute Liters of Oxygen 4 L     6 Minute Oxygen Saturation % 87 %     6 Minute Liters of Oxygen  4 L     2 Minute Post Oxygen Saturation % 94 %     2 Minute Post Liters of Oxygen 4 L        Initial Exercise Prescription:     Initial Exercise Prescription - 11/20/15 1600      Date of Initial Exercise RX and Referring Provider   Date 11/20/15   Referring Provider Dr. Elsworth Soho     Oxygen   Oxygen Continuous   Liters 4      Recumbant Bike   Level 3   Minutes 17     NuStep   Level 2   Minutes 17   METs 1.5     Track   Laps 5   Minutes 17     Prescription Details   Frequency (times per week) 2   Duration Progress to 45 minutes of aerobic exercise without signs/symptoms of physical distress     Intensity   THRR 40-80% of Max Heartrate 58-117   Ratings of Perceived Exertion 11-13   Perceived Dyspnea 0-4     Progression   Progression Continue progressive overload as per policy without signs/symptoms or physical distress.     Resistance Training   Training Prescription Yes   Weight green bands   Reps 10-12      Perform Capillary Blood Glucose checks as needed.  Exercise Prescription Changes:     Exercise Prescription Changes    Row Name 11/29/15 1600 12/11/15 1600 12/13/15 1500         Response to Exercise   Blood Pressure (Admit) 120/80 126/76 112/70     Blood Pressure (Exercise) 150/76 132/70 140/80     Blood Pressure (Exit) 120/72 122/70 112/60     Heart Rate (Admit) 65 bpm 64 bpm 63 bpm     Heart Rate (Exercise) 84 bpm 80 bpm 73 bpm     Heart Rate (Exit) 75 bpm 66 bpm 70 bpm     Oxygen Saturation (Admit) 86 % 94 % 95 %     Oxygen Saturation (Exercise) 89 % 89 % 95 %     Oxygen Saturation (Exit) 95 % 94 % 96 %     Rating of Perceived Exertion (Exercise) _0 Perceived Dyspnea (Exercise) _1 Duration Progress to 45 minutes of aerobic exercise without signs/symptoms of physical distress Progress to 45 minutes of aerobic exercise without signs/symptoms of physical distress Progress to 45 minutes of aerobic exercise without signs/symptoms of physical distress     Intensity -  40-80% HRR THRR unchanged THRR unchanged       Progression   Progression Continue to progress workloads to maintain intensity without signs/symptoms of physical distress.  - Continue to progress workloads to maintain intensity without signs/symptoms of physical distress.       Resistance  Training   Training Prescription Yes Yes Yes     Weight green bands  10 min green bands green bands     Reps 10-12 10-12  10 minutes of strength training 10-12  10 minutes of strength training       Interval Training   Interval Training  -  - No       Oxygen   Oxygen Continuous Continuous Continuous     Liters 4 2-4 2-4       Recumbant Bike   Level 1 1  -     Minutes 17 17  -  NuStep   Level  - 2 2     Minutes  - 17 17     METs  - 1.1 1.5       Track   Laps _0 Minutes _1 Exercise Comments:     Exercise Comments    Row Name 12/17/15 1656           Exercise Comments Patient has only attended three sessions. Will cont. to monitor and progress as able.           Discharge Exercise Prescription (Final Exercise Prescription Changes):     Exercise Prescription Changes - 12/13/15 1500      Response to Exercise   Blood Pressure (Admit) 112/70   Blood Pressure (Exercise) 140/80   Blood Pressure (Exit) 112/60   Heart Rate (Admit) 63 bpm   Heart Rate (Exercise) 73 bpm   Heart Rate (Exit) 70 bpm   Oxygen Saturation (Admit) 95 %   Oxygen Saturation (Exercise) 95 %   Oxygen Saturation (Exit) 96 %   Rating of Perceived Exertion (Exercise) 13   Perceived Dyspnea (Exercise) 3   Duration Progress to 45 minutes of aerobic exercise without signs/symptoms of physical distress   Intensity THRR unchanged     Progression   Progression Continue to progress workloads to maintain intensity without signs/symptoms of physical distress.     Resistance Training   Training Prescription Yes   Weight green bands   Reps 10-12  10 minutes of strength training     Interval Training   Interval Training No     Oxygen   Oxygen Continuous   Liters 2-4     NuStep   Level 2   Minutes 17   METs 1.5     Track   Laps 4   Minutes 17       Nutrition:  Target Goals: Understanding of nutrition guidelines, daily intake of sodium <1543m, cholesterol  <2029m calories 30% from fat and 7% or less from saturated fats, daily to have 5 or more servings of fruits and vegetables.  Biometrics:     Pre Biometrics - 11/16/15 1555      Pre Biometrics   Grip Strength 28 kg       Nutrition Therapy Plan and Nutrition Goals:   Nutrition Discharge: Rate Your Plate Scores:   Psychosocial: Target Goals: Acknowledge presence or absence of depression, maximize coping skills, provide positive support system. Participant is able to verbalize types and ability to use techniques and skills needed for reducing stress and depression.  Initial Review & Psychosocial Screening:     Initial Psych Review & Screening - 11/16/15 1557      Initial Review   Current issues with History of Depression     Family Dynamics   Good Support System? Yes     Barriers   Psychosocial barriers to participate in program There are no identifiable barriers or psychosocial needs.     Screening Interventions   Interventions Encouraged to exercise      Quality of Life Scores:   PHQ-9: Recent Review Flowsheet Data    [facility] 11/16[facility]2015   Decreased Interest 0   Down, Depressed, Hopeless 0   PHQ - 2 Score 0      Psychosocial Evaluation and Intervention:     Psychosocial Evaluation - 11/16/15 1558      Psychosocial Evaluation & Interventions   Interventions Encouraged  to exercise with the program and follow exercise prescription      Psychosocial Re-Evaluation:     Psychosocial Re-Evaluation    [place] Name 12/13/15 1008             Psychosocial Re-Evaluation   Interventions Encouraged to attend Pulmonary Rehabilitation for the exercise       Comments No psychosocial issues identified at this time.       Continued Psychosocial Services Needed No         Education: Education Goals: Education classes will be provided on a weekly basis, covering required topics. Participant will state understanding/return demonstration of topics  presented.  Learning Barriers/Preferences:   Education Topics: Risk Factor Reduction:  -Group instruction that is supported by a PowerPoint presentation. Instructor discusses the definition of a risk factor, different risk factors for pulmonary disease, and how the heart and lungs work together.     Nutrition for Pulmonary Patient:  -Group instruction provided by PowerPoint slides, verbal discussion, and written materials to support subject matter. The instructor gives an explanation and review of healthy diet recommendations, which includes a discussion on weight management, recommendations for fruit and vegetable consumption, as well as protein, fluid, caffeine, fiber, sodium, sugar, and alcohol. Tips for eating when patients are short of breath are discussed.   Pursed Lip Breathing:  -Group instruction that is supported by demonstration and informational handouts. Instructor discusses the benefits of pursed lip and diaphragmatic breathing and detailed demonstration on how to preform both.     Oxygen Safety:  -Group instruction provided by PowerPoint, verbal discussion, and written material to support subject matter. There is an overview of "What is Oxygen" and "Why do we need it".  Instructor also reviews how to create a safe environment for oxygen use, the importance of using oxygen as prescribed, and the risks of noncompliance. There is a brief discussion on traveling with oxygen and resources the patient may utilize.   Oxygen Equipment:  -Group instruction provided by [facility] Staff utilizing handouts, written materials, and equipment demonstrations. Flowsheet Row PULMONARY REHAB CHRONIC OBSTRUCTIVE PULMONARY DISEASE from 12/13/2015 in [place]  Date  11/29/15  Educator  Rep  Instruction Review Code  2- meets goals/outcomes      Signs and Symptoms:  -Group instruction provided by written material and verbal discussion to support subject  matter. Warning signs and symptoms of infection, stroke, and heart attack are reviewed and when to call the physician/911 reinforced. Tips for preventing the spread of infection discussed.   Advanced Directives:  -Group instruction provided by verbal instruction and written material to support subject matter. Instructor reviews Advanced Directive laws and proper instruction for filling out document.   Pulmonary Video:  -Group video education that reviews the importance of medication and oxygen compliance, exercise, good nutrition, pulmonary hygiene, and pursed lip and diaphragmatic breathing for the pulmonary patient.   Exercise for the Pulmonary Patient:  -Group instruction that is supported by a PowerPoint presentation. Instructor discusses benefits of exercise, core components of exercise, frequency, duration, and intensity of an exercise routine, importance of utilizing pulse oximetry during exercise, safety while exercising, and options of places to exercise outside of rehab.   Flowsheet Row PULMONARY REHAB CHRONIC OBSTRUCTIVE PULMONARY DISEASE from 12/13/2015 in [place]  Date  12/13/15  Educator  EP  Instruction Review Code  2- meets goals/outcomes      Pulmonary Medications:  -Verbally interactive group education provided by instructor with  focus on inhaled medications and proper administration.   Anatomy and Physiology of the Respiratory System and Intimacy:  -Group instruction provided by PowerPoint, verbal discussion, and written material to support subject matter. Instructor reviews respiratory cycle and anatomical components of the respiratory system and their functions. Instructor also reviews differences in obstructive and restrictive respiratory diseases with examples of each. Intimacy, Sex, and Sexuality differences are reviewed with a discussion on how relationships can change when diagnosed with pulmonary disease. Common sexual concerns  are reviewed.   Knowledge Questionnaire Score:     Knowledge Questionnaire Score - 12/04/15 1701      Knowledge Questionnaire Score   Pre Score 10/13      Core Components/Risk Factors/Patient Goals at Admission:     Personal Goals and Risk Factors at Admission - 11/16/15 1555      Core Components/Risk Factors/Patient Goals on Admission    Weight Management Obesity;Yes   Intervention Obesity: Provide education and appropriate resources to help participant work on and attain dietary goals.   Expected Outcomes Understanding recommendations for meals to include 15-35% energy as protein, 25-35% energy from fat, 35-60% energy from carbohydrates, less than 272m of dietary cholesterol, 20-35 gm of total fiber daily;Understanding of distribution of calorie intake throughout the day with the consumption of 4-5 meals/snacks;Short Term: Continue to assess and modify interventions until short term weight is achieved   Sedentary Yes   Intervention Provide advice, education, support and counseling about physical activity/exercise needs.;Develop an individualized exercise prescription for aerobic and resistive training based on initial evaluation findings, risk stratification, comorbidities and participant's personal goals.   Expected Outcomes Achievement of increased cardiorespiratory fitness and enhanced flexibility, muscular endurance and strength shown through measurements of functional capacity and personal statement of participant.   Increase Strength and Stamina Yes   Intervention Provide advice, education, support and counseling about physical activity/exercise needs.;Develop an individualized exercise prescription for aerobic and resistive training based on initial evaluation findings, risk stratification, comorbidities and participant's personal goals.   Expected Outcomes Achievement of increased cardiorespiratory fitness and enhanced flexibility, muscular endurance and strength shown through  measurements of functional capacity and personal statement of participant.   Improve shortness of breath with ADL's Yes   Intervention Provide education, individualized exercise plan and daily activity instruction to help decrease symptoms of SOB with activities of daily living.   Expected Outcomes Short Term: Achieves a reduction of symptoms when performing activities of daily living.   Develop more efficient breathing techniques such as purse lipped breathing and diaphragmatic breathing; and practicing self-pacing with activity Yes   Intervention Provide education, demonstration and support about specific breathing techniuqes utilized for more efficient breathing. Include techniques such as pursed lipped breathing, diaphragmatic breathing and self-pacing activity.   Expected Outcomes Short Term: Participant will be able to demonstrate and use breathing techniques as needed throughout daily activities.      Core Components/Risk Factors/Patient Goals Review:      Goals and Risk Factor Review    Row Name 12/13/15 1000             Core Components/Risk Factors/Patient Goals Review   Personal Goals Review Weight Management/Obesity;Sedentary;Improve shortness of breath with ADL's;Increase knowledge of respiratory medications and ability to use respiratory devices properly.;Develop more efficient breathing techniques such as purse lipped breathing and diaphragmatic breathing and practicing self-pacing with activity.;Increase Strength and Stamina       Review Has only attended 2 exercises sessions, too early to see progress.  Expected Outcomes Expect progress in the next 30 days.          Core Components/Risk Factors/Patient Goals at Discharge (Final Review):      Goals and Risk Factor Review - 12/13/15 1000      Core Components/Risk Factors/Patient Goals Review   Personal Goals Review Weight Management/Obesity;Sedentary;Improve shortness of breath with ADL's;Increase knowledge of  respiratory medications and ability to use respiratory devices properly.;Develop more efficient breathing techniques such as purse lipped breathing and diaphragmatic breathing and practicing self-pacing with activity.;Increase Strength and Stamina   Review Has only attended 2 exercises sessions, too early to see progress.   Expected Outcomes Expect progress in the next 30 days.      ITP Comments:   Comments:ITP REVIEW Pt is making expected progress toward pulmonary rehab goals after completing 3 sessions. Recommend continued exercise, life style modification, education, and utilization of breathing techniques to increase stamina and strength and decrease shortness of breath with exertion.

## 2015-12-18 NOTE — Progress Notes (Signed)
Daily Session Note  Patient Details  Name: Beth [name] MRN: [id number] Date of Birth: Jun 30, 1940 Referring Provider:   Flowsheet Row PULMONARY REHAB COPD ORIENTATION from 11/16/2015 in [place]  Referring Provider  Dr. Elsworth Soho      Encounter Date: 12/18/2015  Check In:     Session Check In - 12/18/15 1351      Check-In   Location [facility]   Staff Present Trish Fountain, RN, BSN;Molly diVincenzo, MS, ACSM RCEP, Exercise Physiologist;Lisa Ysidro Evert, RN;Walter Min Leonia Reeves, RN, BSN   Supervising physician immediately available to respond to emergencies [facility] immediately available   Physician(s) Dr. Ree Kida   Medication changes reported     No   Fall or balance concerns reported    No   Warm-up and Cool-down Performed as group-led instruction   Resistance Training Performed Yes   VAD Patient? No     Pain Assessment   Currently in Pain? No/denies   Multiple Pain Sites No      Capillary Blood Glucose: No results found for this or any previous visit (from the past 24 hour(s)).      Exercise Prescription Changes - 12/18/15 1500      Response to Exercise   Blood Pressure (Admit) 122/50   Blood Pressure (Exercise) 110/70   Blood Pressure (Exit) 104/60   Heart Rate (Admit) 75 bpm   Heart Rate (Exercise) 87 bpm   Heart Rate (Exit) 72 bpm   Oxygen Saturation (Admit) 92 %   Oxygen Saturation (Exercise) 92 %   Oxygen Saturation (Exit) 95 %   Rating of Perceived Exertion (Exercise) 13   Perceived Dyspnea (Exercise) 2   Duration Progress to 45 minutes of aerobic exercise without signs/symptoms of physical distress   Intensity THRR unchanged     Progression   Progression Continue to progress workloads to maintain intensity without signs/symptoms of physical distress.     Resistance Training   Training Prescription Yes   Weight green bands   Reps 10-12  10 minutes of strength training     Interval Training   Interval  Training No     Oxygen   Oxygen Continuous   Liters 2-4     Recumbant Bike   Level 1   Minutes 17     NuStep   Level 2   Minutes 17   METs 1.4     Track   Laps 3   Minutes 17     Goals Met:  Exercise tolerated well Strength training completed today  Goals Unmet:  Not Applicable  Comments: Service time is from 1330 to 1515    Dr. Rush Farmer is Medical Director for Pulmonary Rehab at [facility].

## 2015-12-20 ENCOUNTER — Encounter (HOSPITAL_COMMUNITY)
Admission: RE | Admit: 2015-12-20 | Discharge: 2015-12-20 | Disposition: A | Discharge status: Home / Self Care | Payer: Medicare Other | Source: Ambulatory Visit | Attending: Pulmonary Disease | Admitting: Pulmonary Disease

## 2015-12-20 VITALS — Wt 253.3 lb

## 2015-12-20 DIAGNOSIS — J449 Chronic obstructive pulmonary disease, unspecified: Secondary | ICD-10-CM | POA: Diagnosis not present

## 2015-12-20 DIAGNOSIS — Z9981 Dependence on supplemental oxygen: Secondary | ICD-10-CM | POA: Diagnosis not present

## 2015-12-20 NOTE — Progress Notes (Signed)
Daily Session Note  Patient Details  Name: Beth [name] MRN: [id number] Date of Birth: Jan 13, 1941 Referring Provider:   Flowsheet Row PULMONARY REHAB COPD ORIENTATION from 11/16/2015 in [place]  Referring Provider  Dr. Elsworth Soho      Encounter Date: 12/20/2015  Check In:     Session Check In - 12/20/15 1344      Check-In   Location [facility]   Staff Present Su Hilt, MS, ACSM RCEP, Exercise Physiologist;Joan Leonia Reeves, RN, Luisa Hart, RN, Roque Cash, RN   Supervising physician immediately available to respond to emergencies [facility] immediately available   Physician(s) Dr. Allyson Sabal   Medication changes reported     No   Fall or balance concerns reported    No   Warm-up and Cool-down Performed as group-led instruction   Resistance Training Performed Yes   VAD Patient? No     Pain Assessment   Currently in Pain? No/denies   Multiple Pain Sites No      Capillary Blood Glucose: No results found for this or any previous visit (from the past 24 hour(s)).      Exercise Prescription Changes - 12/20/15 1557      Response to Exercise   Blood Pressure (Admit) 116/80   Blood Pressure (Exercise) 130/70   Blood Pressure (Exit) 108/62   Heart Rate (Admit) 70 bpm   Heart Rate (Exercise) 95 bpm   Heart Rate (Exit) 70 bpm   Oxygen Saturation (Admit) 94 %   Oxygen Saturation (Exercise) 88 %   Oxygen Saturation (Exit) 95 %   Rating of Perceived Exertion (Exercise) 15   Perceived Dyspnea (Exercise) 3   Duration Progress to 45 minutes of aerobic exercise without signs/symptoms of physical distress   Intensity THRR unchanged     Progression   Progression Continue to progress workloads to maintain intensity without signs/symptoms of physical distress.     Resistance Training   Training Prescription Yes   Weight green bands   Reps 10-12  10 minutes of strength training     Interval Training   Interval  Training No     Oxygen   Oxygen Continuous   Liters 2-4     NuStep   Level 2   Minutes 17   METs 1.5     Track   Laps 4   Minutes 17     Goals Met:  Improved SOB with ADL's Using PLB without cueing & demonstrates good technique Exercise tolerated well No report of cardiac concerns or symptoms Strength training completed today  Goals Unmet:  Not Applicable  Comments: Service time is from 1330 to 1530   Dr. Rush Farmer is Medical Director for Pulmonary Rehab at [facility].

## 2015-12-24 ENCOUNTER — Telehealth (HOSPITAL_COMMUNITY): Payer: Self-pay | Admitting: *Deleted

## 2015-12-24 ENCOUNTER — Telehealth: Payer: Self-pay | Admitting: Adult Health

## 2015-12-24 DIAGNOSIS — J0100 Acute maxillary sinusitis, unspecified: Secondary | ICD-10-CM

## 2015-12-24 MED ORDER — DOXYCYCLINE HYCLATE 100 MG PO TABS: 100.0000 mg | ORAL_TABLET | Freq: Two times a day (BID) | ORAL | 0 refills | Status: DC

## 2015-12-24 NOTE — Telephone Encounter (Signed)
I spoke with patient-states she is using Allegra now. Pt states she would like to see another ENT that is through [facility] system instead of [facility] or maybe change to [facility] Pt will contact their office to check on their policy and procedures to change physicians.    Blood tinged mucus when blowing her nose. Pt thinks she may need an abx called in (10 days worth at least) and with 1 refill if possible.   Has had Doxycyline 100 mg 1 po BID.     Pharmacy: [place].

## 2015-12-24 NOTE — Telephone Encounter (Signed)
That is okay to refer to ENT .  Begin in Doxycycline 1 Twice daily  #20 , take w/ food.

## 2015-12-24 NOTE — Telephone Encounter (Signed)
She just saw ENT , she can call for OV that is fine  Does she need something called , Is she taking nasal spray , antihistamine ???? Please contact office for sooner follow up if symptoms do not improve or worsen or seek emergency care

## 2015-12-24 NOTE — Telephone Encounter (Signed)
TP  Please Advise-Sick Message  Can you advise for [place] in RA absence.You saw this pt. In Nov.  Pt. Called in stating her nose is swollen and it has been difficult for her to breath with her oxygen because of this, She c/o coughing, clearing her throat a lot, blowing her nose with some thick mucus with some pinkish color,some dizziness,sore throat, Pt. States she feels so bad she canceled her pulmonary rehab classes for this week. Denies fever,chills.

## 2015-12-24 NOTE — Telephone Encounter (Signed)
Patient contacted [facility]. Patient stated she will not see them at all. Patient is requesting an antibiotic. Patient understood that we will schedule her an appointment at another facility over by [facility] to see an ENT.

## 2015-12-24 NOTE — Telephone Encounter (Signed)
Aware of rec's per TP, Doxy sent to Pharmacy and referral placed for ENT.  Per TP go to ED if symptoms worsen or do not improve.

## 2015-12-25 ENCOUNTER — Encounter (HOSPITAL_COMMUNITY): Admission: RE | Admit: 2015-12-25 | Payer: Medicare Other | Source: Ambulatory Visit

## 2015-12-25 MED ORDER — DOXYCYCLINE HYCLATE 100 MG PO TABS: 100.0000 mg | ORAL_TABLET | Freq: Two times a day (BID) | ORAL | 0 refills | Status: DC

## 2015-12-25 NOTE — Telephone Encounter (Addendum)
Rx was mistakenly printed instead of sent electronically. Rx sent electronically. LMTCB for pt.

## 2015-12-25 NOTE — Addendum Note (Signed)
Addended by: Collier Salina on: 12/25/2015 08:59 AM   Modules accepted: Orders

## 2015-12-25 NOTE — Progress Notes (Signed)
Beth [name] 75 y.o. female  30 day Psychosocial Note  Patient psychosocial assessment reveals no barriers to participation in Pulmonary Rehab. Patient does feel she is making progress toward Pulmonary Rehab goals. Patient reports her health and activity level has not improved in the past 30 days due in part to poor attendance in the last 30 days.  She has only attended 5 exercise sessions and has been absent due to a COPD exacerbation.  Patient states family/friends have not noticed changes in her activity or mood. Patient reports  feeling positive about current and projected progression in Pulmonary Rehab. After reviewing the patient's treatment plan, the patient is making progress toward Pulmonary Rehab goals, but it is slow due to her poor attendance.  Patient's rate of progress toward rehab goals is fair. Plan of action to help patient continue to work towards rehab goals include encourage more regular attendance so as to see steady increase in her strength and stamina. Will continue to monitor and evaluate progress toward psychosocial goal(s).  Goal(s) in progress: Encourage regular attendance so as to see steady increase in strength and stamina Educate in the importance of exercise in her health and her COPD Help patient work toward returning to meaningful activities that improve patient's QOL and are attainable with patient's lung disease

## 2015-12-27 ENCOUNTER — Encounter (HOSPITAL_COMMUNITY): Payer: Medicare Other

## 2015-12-27 DIAGNOSIS — J310 Chronic rhinitis: Secondary | ICD-10-CM | POA: Diagnosis not present

## 2016-01-01 ENCOUNTER — Encounter (HOSPITAL_COMMUNITY)
Admission: RE | Admit: 2016-01-01 | Discharge: 2016-01-01 | Disposition: A | Discharge status: Home / Self Care | Payer: Medicare Other | Source: Ambulatory Visit | Attending: Pulmonary Disease | Admitting: Pulmonary Disease

## 2016-01-01 VITALS — Wt 251.8 lb

## 2016-01-01 DIAGNOSIS — J449 Chronic obstructive pulmonary disease, unspecified: Secondary | ICD-10-CM

## 2016-01-01 DIAGNOSIS — Z9981 Dependence on supplemental oxygen: Secondary | ICD-10-CM | POA: Diagnosis not present

## 2016-01-01 NOTE — Progress Notes (Signed)
Daily Session Note  Patient Details  Name: Beth [name] MRN: [id number] Date of Birth: 26-Jan-1940 Referring Provider:   Flowsheet Row PULMONARY REHAB COPD ORIENTATION from 11/16/2015 in [place]  Referring Provider  Dr. Elsworth Soho      Encounter Date: 01/01/2016  Check In:     Session Check In - 01/01/16 1614      Check-In   Location [facility]   Staff Present Su Hilt, MS, ACSM RCEP, Exercise Physiologist;Annedrea Stackhouse, RN, MHA;Cypress Fanfan Rollene Rotunda, RN, BSN   Supervising physician immediately available to respond to emergencies [facility] immediately available   Physician(s) Dr. Carles Collet   Medication changes reported     No   Fall or balance concerns reported    No   Warm-up and Cool-down Performed as group-led instruction   Resistance Training Performed Yes   VAD Patient? No     Pain Assessment   Currently in Pain? No/denies   Multiple Pain Sites No      Capillary Blood Glucose: No results found for this or any previous visit (from the past 24 hour(s)).      Exercise Prescription Changes - 01/01/16 1635      Response to Exercise   Blood Pressure (Admit) 118/60   Blood Pressure (Exercise) 140/84   Blood Pressure (Exit) 108/60   Heart Rate (Admit) 73 bpm   Heart Rate (Exercise) 92 bpm   Heart Rate (Exit) 74 bpm   Oxygen Saturation (Admit) 91 %   Oxygen Saturation (Exercise) 87 %   Oxygen Saturation (Exit) 94 %   Rating of Perceived Exertion (Exercise) 15   Perceived Dyspnea (Exercise) 3   Duration Progress to 45 minutes of aerobic exercise without signs/symptoms of physical distress   Intensity THRR unchanged     Progression   Progression Continue to progress workloads to maintain intensity without signs/symptoms of physical distress.     Resistance Training   Training Prescription Yes   Weight green bands   Reps 10-12  10 minutes of strength training     Interval Training   Interval Training No     Oxygen   Oxygen Continuous   Liters 2-4     NuStep   Level 2   Minutes 17   METs 1.5     Track   Laps 5   Minutes 34     Goals Met:  Exercise tolerated well Queuing for purse lip breathing No report of cardiac concerns or symptoms Strength training completed today  Goals Unmet:  Not Applicable  Comments: Service time is from 1330 to 1500   Dr. Rush Farmer is Medical Director for Pulmonary Rehab at [facility]

## 2016-01-03 ENCOUNTER — Encounter (HOSPITAL_COMMUNITY)
Admission: RE | Admit: 2016-01-03 | Discharge: 2016-01-03 | Disposition: A | Discharge status: Home / Self Care | Payer: Medicare Other | Source: Ambulatory Visit | Attending: Pulmonary Disease | Admitting: Pulmonary Disease

## 2016-01-03 ENCOUNTER — Ambulatory Visit: Payer: Medicare Other

## 2016-01-03 VITALS — Wt 253.1 lb

## 2016-01-03 DIAGNOSIS — J449 Chronic obstructive pulmonary disease, unspecified: Secondary | ICD-10-CM | POA: Diagnosis not present

## 2016-01-03 DIAGNOSIS — Z9981 Dependence on supplemental oxygen: Secondary | ICD-10-CM | POA: Diagnosis not present

## 2016-01-03 NOTE — Progress Notes (Signed)
Daily Session Note  Patient Details  Name: Beth [name] MRN: [id number] Date of Birth: [birth year] Referring Provider:   Flowsheet Row PULMONARY REHAB COPD ORIENTATION from 11/16/2015 in [place]  Referring Provider  Dr. Elsworth Soho      Encounter Date: 01/03/2016  Check In:     Session Check In - 01/03/16 1334      Check-In   Location [facility]   Staff Present Su Hilt, MS, ACSM RCEP, Exercise Physiologist;Kale Dols Leonia Reeves, RN, Luisa Hart, RN, BSN   Supervising physician immediately available to respond to emergencies [facility] immediately available   Physician(s) Dr. Ree Kida   Medication changes reported     No   Fall or balance concerns reported    No   Warm-up and Cool-down Performed as group-led instruction   Resistance Training Performed Yes   VAD Patient? No     Pain Assessment   Currently in Pain? No/denies   Multiple Pain Sites No      Capillary Blood Glucose: No results found for this or any previous visit (from the past 24 hour(s)).      Exercise Prescription Changes - 01/03/16 1500      Response to Exercise   Blood Pressure (Admit) 130/72   Blood Pressure (Exercise) 174/84   Blood Pressure (Exit) 104/40   Heart Rate (Admit) 71 bpm   Heart Rate (Exercise) 89 bpm   Heart Rate (Exit) 74 bpm   Oxygen Saturation (Admit) 92 %   Oxygen Saturation (Exercise) 93 %   Oxygen Saturation (Exit) 96 %   Rating of Perceived Exertion (Exercise) 14   Perceived Dyspnea (Exercise) 3   Duration Progress to 45 minutes of aerobic exercise without signs/symptoms of physical distress   Intensity THRR unchanged     Progression   Progression Continue to progress workloads to maintain intensity without signs/symptoms of physical distress.     Resistance Training   Training Prescription Yes   Weight green bands   Reps 10-12  10 minutes of strength training     Interval Training   Interval Training No     Oxygen   Oxygen Continuous   Liters 2-4     NuStep   Level 2   Minutes 17   METs 1.8     Track   Laps 3   Minutes 17     Goals Met:  Exercise tolerated well Strength training completed today  Goals Unmet:  Not Applicable  Comments: Service time is from 1330 to 1515    Dr. Rush Farmer is Medical Director for Pulmonary Rehab at [facility].

## 2016-01-08 ENCOUNTER — Encounter (HOSPITAL_COMMUNITY): Payer: Medicare Other

## 2016-01-10 ENCOUNTER — Ambulatory Visit: Payer: Medicare Other

## 2016-01-10 ENCOUNTER — Encounter (HOSPITAL_COMMUNITY)
Admission: RE | Admit: 2016-01-10 | Discharge: 2016-01-10 | Disposition: A | Discharge status: Home / Self Care | Payer: Medicare Other | Source: Ambulatory Visit | Attending: Pulmonary Disease | Admitting: Pulmonary Disease

## 2016-01-10 VITALS — Wt 256.6 lb

## 2016-01-10 DIAGNOSIS — J449 Chronic obstructive pulmonary disease, unspecified: Secondary | ICD-10-CM

## 2016-01-10 DIAGNOSIS — Z9981 Dependence on supplemental oxygen: Secondary | ICD-10-CM | POA: Diagnosis not present

## 2016-01-10 NOTE — Progress Notes (Signed)
Daily Session Note  Patient Details  Name: Beth [name] MRN: [id number] Date of Birth: 1940/07/01 Referring Provider:   Flowsheet Row PULMONARY REHAB COPD ORIENTATION from 11/16/2015 in [place]  Referring Provider  Dr. Elsworth Soho      Encounter Date: 01/10/2016  Check In:     Session Check In - 01/10/16 1330      Check-In   Location [facility]   Staff Present Trish Fountain, RN, BSN;Lisa Ysidro Evert, RN;Bianca Raneri Leonia Reeves, RN, BSN   Supervising physician immediately available to respond to emergencies [facility] immediately available   Physician(s) Dr. Wynetta Emery   Medication changes reported     No   Fall or balance concerns reported    No   Warm-up and Cool-down Performed as group-led instruction   Resistance Training Performed Yes   VAD Patient? No     Pain Assessment   Currently in Pain? No/denies   Multiple Pain Sites No      Capillary Blood Glucose: No results found for this or any previous visit (from the past 24 hour(s)).      Exercise Prescription Changes - 01/10/16 1600      Response to Exercise   Blood Pressure (Admit) 110/60   Blood Pressure (Exercise) 156/62   Blood Pressure (Exit) 120/66   Heart Rate (Admit) 67 bpm   Heart Rate (Exercise) 75 bpm   Heart Rate (Exit) 76 bpm   Oxygen Saturation (Admit) 95 %   Oxygen Saturation (Exercise) 96 %   Oxygen Saturation (Exit) 91 %   Rating of Perceived Exertion (Exercise) 13   Perceived Dyspnea (Exercise) 2   Duration Progress to 45 minutes of aerobic exercise without signs/symptoms of physical distress   Intensity THRR unchanged     Progression   Progression Continue to progress workloads to maintain intensity without signs/symptoms of physical distress.     Resistance Training   Training Prescription Yes   Weight green bands   Reps 10-12  10 minutes of strength training     Interval Training   Interval Training No     Oxygen   Oxygen Continuous   Liters  2-4     NuStep   Level 2   Minutes 17   METs 1.4     Track   Laps 4   Minutes 17     Goals Met:  Exercise tolerated well Strength training completed today  Goals Unmet:  Not Applicable  Comments: Service time is from 1330 to 1515    Dr. Rush Farmer is Medical Director for Pulmonary Rehab at [facility].

## 2016-01-10 NOTE — Progress Notes (Signed)
Pulmonary Individual Treatment Plan  Patient Details  Name: Beth [name] MRN: [id number] Date of Birth: 1940/07/20 Referring Provider:   Flowsheet Row PULMONARY REHAB COPD ORIENTATION from 11/16/2015 in [place]  Referring Provider  Dr. Elsworth Soho      Initial Encounter Date:  Flowsheet Row PULMONARY REHAB COPD ORIENTATION from 11/16/2015 in [place]  Date  11/20/15  Referring Provider  Dr. Elsworth Soho      Visit Diagnosis: Chronic obstructive pulmonary disease, unspecified COPD type ([place])  Patient's Home Medications on Admission:   Current Outpatient Prescriptions:  .  albuterol (PROVENTIL HFA;VENTOLIN HFA) 108 (90 Base) MCG/ACT inhaler, Inhale 2 puffs into the lungs every 4 (four) hours as needed for wheezing or shortness of breath., Disp: 1 Inhaler, Rfl: 5 .  aspirin EC 81 MG tablet, Take 81 mg by mouth daily., Disp: , Rfl:  .  atenolol (TENORMIN) 50 MG tablet, TAKE 1/2 TABLET TWICE A DAY, Disp: , Rfl:  .  captopril (CAPOTEN) 50 MG tablet, Take 50 mg by mouth 2 (two) times daily., Disp: , Rfl:  .  Cholecalciferol (VITAMIN D3) 2000 units capsule, 1 tablet, Disp: , Rfl:  .  clonazePAM (KLONOPIN) 2 MG tablet, Take 1 tablet (2 mg total) by mouth at bedtime., Disp: 90 tablet, Rfl: 1 .  doxycycline (VIBRA-TABS) 100 MG tablet, Take 1 tablet (100 mg total) by mouth 2 (two) times daily. Take with food., Disp: 20 tablet, Rfl: 0 .  fexofenadine (ALLEGRA) 180 MG tablet, Take 180 mg by mouth daily. , Disp: , Rfl:  .  fluticasone (FLONASE) 50 MCG/ACT nasal spray, Place 2 sprays into the nose daily as needed for allergies. Spray twice daily as needed, Disp: , Rfl:  .  guaiFENesin (MUCINEX) 600 MG 12 hr tablet, Take 1,200 mg by mouth 2 (two) times daily as needed for cough. , Disp: , Rfl:  .  KEPPRA 750 MG tablet, Take 1 tablet (750 mg total) by mouth 2 (two) times daily., Disp: 180 tablet, Rfl: 3 .  LEXAPRO 20 MG tablet, Take 1 tablet (20 mg  total) by mouth daily. Morning dosage (Patient taking differently: Take 20 mg by mouth daily. ), Disp: 90 tablet, Rfl: 3 .  meloxicam (MOBIC) 15 MG tablet, 1 tablet, Disp: , Rfl:  .  simvastatin (ZOCOR) 10 MG tablet, Take 10 mg by mouth at bedtime., Disp: , Rfl:  .  triamterene-hydrochlorothiazide (MAXZIDE-25) 37.5-25 MG tablet, Take 1 tablet by mouth daily., Disp: , Rfl:  .  umeclidinium-vilanterol (ANORO ELLIPTA) 62.5-25 MCG/INH AEPB, 1 puff, Disp: , Rfl:   Past Medical History: Past Medical History:  Diagnosis Date  . BRCA2 positive 2017  . Diverticulosis   . Hearing loss   . HTN (hypertension)   . Memory loss   . 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 Seizures ([place])   . 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   . Vestibulitis of ear     Tobacco Use: History  Smoking Status  . Former Smoker  . Packs/day: 1.00  . Years: 41.00  . Quit date: 01/13/2006  Smokeless Tobacco  . Never Used    Labs: Recent Review Flowsheet Data    Labs for ITP Cardiac and Pulmonary Rehab Latest Ref Rng & Units 04/16/2007 07/05/2008   Cholestrol - 146 ATP III CLASSIFICATION: <200     mg/dL   Desirable 200-239  mg/dL   Borderline High >=240  mg/dL   High -   LDLCALC - 84 Total Cholesterol/HDL:CHD Risk Coronary Heart Disease Risk Table Men   Women 1/2 Average Risk   3.4   3.3 -   HDL - 47 -   Trlycerides - 75 -   TCO2 0 - 100 mmol/L - 23      Capillary Blood Glucose: Lab Results  Component Value Date   GLUCAP 117 (H) 06/10/2008     ADL UCSD:     Pulmonary Assessment Scores    Row Name 12/04/15 1701         ADL UCSD   ADL Phase Entry     SOB Score total 43       CAT Score   CAT Score 11        Pulmonary Function Assessment:     Pulmonary Function Assessment - 11/16/15 1555      Breath   Bilateral Breath Sounds Decreased   Shortness of Breath Limiting activity      Exercise Target Goals:    Exercise  Program Goal: Individual exercise prescription set with THRR, safety & activity barriers. Participant demonstrates ability to understand and report RPE using BORG scale, to self-measure pulse accurately, and to acknowledge the importance of the exercise prescription.  Exercise Prescription Goal: Starting with aerobic activity 30 plus minutes a day, 3 days per week for initial exercise prescription. Provide home exercise prescription and guidelines that participant acknowledges understanding prior to discharge.  Activity Barriers & Risk Stratification:   6 Minute Walk:     6 Minute Walk    Row Name 11/20/15 0908         6 Minute Walk   Phase Initial     Distance 810 feet     Walk Time -  4 minutes 30 seconds     # of Rest Breaks 1  1 minute 30 seconds     RPE 14     Perceived Dyspnea  2     Symptoms Yes (comment)     Comments leg fatigue, ankle pain     Resting HR 69 bpm     Resting BP 130/80     Max Ex. HR 98 bpm     Max Ex. BP 148/80       Interval HR   Baseline HR 69     1 Minute HR 85     2 Minute HR 92     3 Minute HR 98     4 Minute HR 77     5 Minute HR 90     6 Minute HR 96     2 Minute Post HR 77     Interval Heart Rate? Yes       Interval Oxygen   Interval Oxygen? Yes     Baseline Oxygen Saturation % 90 %     Baseline Liters of Oxygen 0 L     1 Minute Oxygen Saturation % 85 %     1 Minute Liters of Oxygen 0 L     2 Minute Oxygen Saturation % 77 %     2 Minute Liters of Oxygen 0 L     3 Minute Oxygen Saturation % 74 %     3 Minute Liters of Oxygen 2 L     4 Minute Oxygen Saturation % 94 %     4 Minute Liters of Oxygen 4 L     5 Minute Oxygen Saturation % 94 %  5 Minute Liters of Oxygen 4 L     6 Minute Oxygen Saturation % 87 %     6 Minute Liters of Oxygen 4 L     2 Minute Post Oxygen Saturation % 94 %     2 Minute Post Liters of Oxygen 4 L        Initial Exercise Prescription:     Initial Exercise Prescription - 11/20/15 1600      Date  of Initial Exercise RX and Referring Provider   Date 11/20/15   Referring Provider Dr. Elsworth Soho     Oxygen   Oxygen Continuous   Liters 4     Recumbant Bike   Level 3   Minutes 17     NuStep   Level 2   Minutes 17   METs 1.5     Track   Laps 5   Minutes 17     Prescription Details   Frequency (times per week) 2   Duration Progress to 45 minutes of aerobic exercise without signs/symptoms of physical distress     Intensity   THRR 40-80% of Max Heartrate 58-117   Ratings of Perceived Exertion 11-13   Perceived Dyspnea 0-4     Progression   Progression Continue progressive overload as per policy without signs/symptoms or physical distress.     Resistance Training   Training Prescription Yes   Weight green bands   Reps 10-12      Perform Capillary Blood Glucose checks as needed.  Exercise Prescription Changes:     Exercise Prescription Changes    Row Name 11/29/15 1600 12/11/15 1600 12/13/15 1500 12/18/15 1500 12/20/15 1557     Response to Exercise   Blood Pressure (Admit) 120/80 126/76 112/70 122/50 116/80   Blood Pressure (Exercise) 150/76 132/70 140/80 110/70 130/70   Blood Pressure (Exit) 120/72 122/70 112/60 104/60 108/62   Heart Rate (Admit) 65 bpm 64 bpm 63 bpm 75 bpm 70 bpm   Heart Rate (Exercise) 84 bpm 80 bpm 73 bpm 87 bpm 95 bpm   Heart Rate (Exit) 75 bpm 66 bpm 70 bpm 72 bpm 70 bpm   Oxygen Saturation (Admit) 86 % 94 % 95 % 92 % 94 %   Oxygen Saturation (Exercise) 89 % 89 % 95 % 92 % 88 %   Oxygen Saturation (Exit) 95 % 94 % 96 % 95 % 95 %   Rating of Perceived Exertion (Exercise) '15 15 13 13 15   '$ Perceived Dyspnea (Exercise) '2 3 3 2 3   '$ Duration Progress to 45 minutes of aerobic exercise without signs/symptoms of physical distress Progress to 45 minutes of aerobic exercise without signs/symptoms of physical distress Progress to 45 minutes of aerobic exercise without signs/symptoms of physical distress Progress to 45 minutes of aerobic exercise without  signs/symptoms of physical distress Progress to 45 minutes of aerobic exercise without signs/symptoms of physical distress   Intensity -  40-80% HRR THRR unchanged THRR unchanged THRR unchanged THRR unchanged     Progression   Progression Continue to progress workloads to maintain intensity without signs/symptoms of physical distress.  - Continue to progress workloads to maintain intensity without signs/symptoms of physical distress. Continue to progress workloads to maintain intensity without signs/symptoms of physical distress. Continue to progress workloads to maintain intensity without signs/symptoms of physical distress.     Resistance Training   Training Prescription Yes Yes Yes Yes Yes   Weight green bands  10 min green bands green bands green bands green  bands   Reps 10-12 10-12  10 minutes of strength training 10-12  10 minutes of strength training 10-12  10 minutes of strength training 10-12  10 minutes of strength training     Interval Training   Interval Training  -  - No No No     Oxygen   Oxygen Continuous Continuous Continuous Continuous Continuous   Liters 4 2-4 2-4 2-4 2-4     Recumbant Bike   Level 1 1  - 1  -   Minutes 17 17  - 17  -     NuStep   Level  - '2 2 2 2   '$ Minutes  - '17 17 17 17   '$ METs  - 1.1 1.5 1.4 1.5     Track   Laps '5 6 4 3 4   '$ Minutes '17 17 17 17 17   '$ Row Name 01/01/16 1635 01/03/16 1500           Response to Exercise   Blood Pressure (Admit) 118/60 130/72      Blood Pressure (Exercise) 140/84 174/84      Blood Pressure (Exit) 108/60 104/40      Heart Rate (Admit) 73 bpm 71 bpm      Heart Rate (Exercise) 92 bpm 89 bpm      Heart Rate (Exit) 74 bpm 74 bpm      Oxygen Saturation (Admit) 91 % 92 %      Oxygen Saturation (Exercise) 87 % 93 %      Oxygen Saturation (Exit) 94 % 96 %      Rating of Perceived Exertion (Exercise) 15 14      Perceived Dyspnea (Exercise) 3 3      Duration Progress to 45 minutes of aerobic exercise without  signs/symptoms of physical distress Progress to 45 minutes of aerobic exercise without signs/symptoms of physical distress      Intensity THRR unchanged THRR unchanged        Progression   Progression Continue to progress workloads to maintain intensity without signs/symptoms of physical distress. Continue to progress workloads to maintain intensity without signs/symptoms of physical distress.        Resistance Training   Training Prescription Yes Yes      Weight green bands green bands      Reps 10-12  10 minutes of strength training 10-12  10 minutes of strength training        Interval Training   Interval Training No No        Oxygen   Oxygen Continuous Continuous      Liters 2-4 2-4        NuStep   Level 2 2      Minutes 17 17      METs 1.5 1.8        Track   Laps 5 3      Minutes 34 17         Exercise Comments:     Exercise Comments    Row Name 12/17/15 1656 01/03/16 0907         Exercise Comments Patient has only attended three sessions. Will cont. to monitor and progress as able.  Patient is not making a lot of progress with workload intensities. Patient states that she is working at a "very hard" level. Only walked two laps in 15 minutes. I feel that patient can work harder. Will cont. to monitor and progress.  Discharge Exercise Prescription (Final Exercise Prescription Changes):     Exercise Prescription Changes - 01/03/16 1500      Response to Exercise   Blood Pressure (Admit) 130/72   Blood Pressure (Exercise) 174/84   Blood Pressure (Exit) 104/40   Heart Rate (Admit) 71 bpm   Heart Rate (Exercise) 89 bpm   Heart Rate (Exit) 74 bpm   Oxygen Saturation (Admit) 92 %   Oxygen Saturation (Exercise) 93 %   Oxygen Saturation (Exit) 96 %   Rating of Perceived Exertion (Exercise) 14   Perceived Dyspnea (Exercise) 3   Duration Progress to 45 minutes of aerobic exercise without signs/symptoms of physical distress   Intensity THRR unchanged      Progression   Progression Continue to progress workloads to maintain intensity without signs/symptoms of physical distress.     Resistance Training   Training Prescription Yes   Weight green bands   Reps 10-12  10 minutes of strength training     Interval Training   Interval Training No     Oxygen   Oxygen Continuous   Liters 2-4     NuStep   Level 2   Minutes 17   METs 1.8     Track   Laps 3   Minutes 17       Nutrition:  Target Goals: Understanding of nutrition guidelines, daily intake of sodium '1500mg'$ , cholesterol '200mg'$ , calories 30% from fat and 7% or less from saturated fats, daily to have 5 or more servings of fruits and vegetables.  Biometrics:     Pre Biometrics - 11/16/15 1555      Pre Biometrics   Grip Strength 28 kg       Nutrition Therapy Plan and Nutrition Goals:   Nutrition Discharge: Rate Your Plate Scores:   Psychosocial: Target Goals: Acknowledge presence or absence of depression, maximize coping skills, provide positive support system. Participant is able to verbalize types and ability to use techniques and skills needed for reducing stress and depression.  Initial Review & Psychosocial Screening:     Initial Psych Review & Screening - 11/16/15 1557      Initial Review   Current issues with History of Depression     Family Dynamics   Good Support System? Yes     Barriers   Psychosocial barriers to participate in program There are no identifiable barriers or psychosocial needs.     Screening Interventions   Interventions Encouraged to exercise      Quality of Life Scores:   PHQ-9: Recent Review Flowsheet Data    Depression screen [facility] 2/9 11/16/2015   Decreased Interest 0   Down, Depressed, Hopeless 0   PHQ - 2 Score 0      Psychosocial Evaluation and Intervention:     Psychosocial Evaluation - 11/16/15 1558      Psychosocial Evaluation & Interventions   Interventions Encouraged to exercise with the program and  follow exercise prescription      Psychosocial Re-Evaluation:     Psychosocial Re-Evaluation    [place] Name 12/13/15 1008 12/31/15 1359           Psychosocial Re-Evaluation   Interventions Encouraged to attend Pulmonary Rehabilitation for the exercise Encouraged to attend Pulmonary Rehabilitation for the exercise      Comments No psychosocial issues identified at this time. No psychosocial issues identified at this time.      Continued Psychosocial Services Needed No No        Education: Education Goals: Education  classes will be provided on a weekly basis, covering required topics. Participant will state understanding/return demonstration of topics presented.  Learning Barriers/Preferences:   Education Topics: Risk Factor Reduction:  -Group instruction that is supported by a PowerPoint presentation. Instructor discusses the definition of a risk factor, different risk factors for pulmonary disease, and how the heart and lungs work together.     Nutrition for Pulmonary Patient:  -Group instruction provided by PowerPoint slides, verbal discussion, and written materials to support subject matter. The instructor gives an explanation and review of healthy diet recommendations, which includes a discussion on weight management, recommendations for fruit and vegetable consumption, as well as protein, fluid, caffeine, fiber, sodium, sugar, and alcohol. Tips for eating when patients are short of breath are discussed.   Pursed Lip Breathing:  -Group instruction that is supported by demonstration and informational handouts. Instructor discusses the benefits of pursed lip and diaphragmatic breathing and detailed demonstration on how to preform both.     Oxygen Safety:  -Group instruction provided by PowerPoint, verbal discussion, and written material to support subject matter. There is an overview of "What is Oxygen" and "Why do we need it".  Instructor also reviews how to create a safe  environment for oxygen use, the importance of using oxygen as prescribed, and the risks of noncompliance. There is a brief discussion on traveling with oxygen and resources the patient may utilize.   Oxygen Equipment:  -Group instruction provided by [facility] Staff utilizing handouts, written materials, and equipment demonstrations. Flowsheet Row PULMONARY REHAB CHRONIC OBSTRUCTIVE PULMONARY DISEASE from 01/03/2016 in [place]  Date  11/29/15  Educator  Rep  Instruction Review Code  2- meets goals/outcomes      Signs and Symptoms:  -Group instruction provided by written material and verbal discussion to support subject matter. Warning signs and symptoms of infection, stroke, and heart attack are reviewed and when to call the physician/911 reinforced. Tips for preventing the spread of infection discussed. Flowsheet Row PULMONARY REHAB CHRONIC OBSTRUCTIVE PULMONARY DISEASE from 01/03/2016 in [place]  Date  12/20/15  Educator  rn  Instruction Review Code  2- meets goals/outcomes      Advanced Directives:  -Group instruction provided by verbal instruction and written material to support subject matter. Instructor reviews Advanced Directive laws and proper instruction for filling out document.   Pulmonary Video:  -Group video education that reviews the importance of medication and oxygen compliance, exercise, good nutrition, pulmonary hygiene, and pursed lip and diaphragmatic breathing for the pulmonary patient.   Exercise for the Pulmonary Patient:  -Group instruction that is supported by a PowerPoint presentation. Instructor discusses benefits of exercise, core components of exercise, frequency, duration, and intensity of an exercise routine, importance of utilizing pulse oximetry during exercise, safety while exercising, and options of places to exercise outside of rehab.   Flowsheet Row PULMONARY REHAB CHRONIC  OBSTRUCTIVE PULMONARY DISEASE from 01/03/2016 in [place]  Date  12/13/15  Educator  EP  Instruction Review Code  2- meets goals/outcomes      Pulmonary Medications:  -Verbally interactive group education provided by instructor with focus on inhaled medications and proper administration.   Anatomy and Physiology of the Respiratory System and Intimacy:  -Group instruction provided by PowerPoint, verbal discussion, and written material to support subject matter. Instructor reviews respiratory cycle and anatomical components of the respiratory system and their functions. Instructor also reviews differences in obstructive and restrictive  respiratory diseases with examples of each. Intimacy, Sex, and Sexuality differences are reviewed with a discussion on how relationships can change when diagnosed with pulmonary disease. Common sexual concerns are reviewed.   Knowledge Questionnaire Score:     Knowledge Questionnaire Score - 12/04/15 1701      Knowledge Questionnaire Score   Pre Score 10/13      Core Components/Risk Factors/Patient Goals at Admission:     Personal Goals and Risk Factors at Admission - 11/16/15 1555      Core Components/Risk Factors/Patient Goals on Admission    Weight Management Obesity;Yes   Intervention Obesity: Provide education and appropriate resources to help participant work on and attain dietary goals.   Expected Outcomes Understanding recommendations for meals to include 15-35% energy as protein, 25-35% energy from fat, 35-60% energy from carbohydrates, less than '200mg'$  of dietary cholesterol, 20-35 gm of total fiber daily;Understanding of distribution of calorie intake throughout the day with the consumption of 4-5 meals/snacks;Short Term: Continue to assess and modify interventions until short term weight is achieved   Sedentary Yes   Intervention Provide advice, education, support and counseling about physical  activity/exercise needs.;Develop an individualized exercise prescription for aerobic and resistive training based on initial evaluation findings, risk stratification, comorbidities and participant's personal goals.   Expected Outcomes Achievement of increased cardiorespiratory fitness and enhanced flexibility, muscular endurance and strength shown through measurements of functional capacity and personal statement of participant.   Increase Strength and Stamina Yes   Intervention Provide advice, education, support and counseling about physical activity/exercise needs.;Develop an individualized exercise prescription for aerobic and resistive training based on initial evaluation findings, risk stratification, comorbidities and participant's personal goals.   Expected Outcomes Achievement of increased cardiorespiratory fitness and enhanced flexibility, muscular endurance and strength shown through measurements of functional capacity and personal statement of participant.   Improve shortness of breath with ADL's Yes   Intervention Provide education, individualized exercise plan and daily activity instruction to help decrease symptoms of SOB with activities of daily living.   Expected Outcomes Short Term: Achieves a reduction of symptoms when performing activities of daily living.   Develop more efficient breathing techniques such as purse lipped breathing and diaphragmatic breathing; and practicing self-pacing with activity Yes   Intervention Provide education, demonstration and support about specific breathing techniuqes utilized for more efficient breathing. Include techniques such as pursed lipped breathing, diaphragmatic breathing and self-pacing activity.   Expected Outcomes Short Term: Participant will be able to demonstrate and use breathing techniques as needed throughout daily activities.      Core Components/Risk Factors/Patient Goals Review:      Goals and Risk Factor Review    Row Name  12/13/15 1000 12/31/15 1357           Core Components/Risk Factors/Patient Goals Review   Personal Goals Review Weight Management/Obesity;Sedentary;Improve shortness of breath with ADL's;Increase knowledge of respiratory medications and ability to use respiratory devices properly.;Develop more efficient breathing techniques such as purse lipped breathing and diaphragmatic breathing and practicing self-pacing with activity.;Increase Strength and Stamina Weight Management/Obesity;Sedentary;Increase knowledge of respiratory medications and ability to use respiratory devices properly.;Develop more efficient breathing techniques such as purse lipped breathing and diaphragmatic breathing and practicing self-pacing with activity.;Increase Strength and Stamina      Review Has only attended 2 exercises sessions, too early to see progress. Has been absent last week due to an illness, attendance has been poor, I question her diligence in the program.      Expected Outcomes Expect  progress in the next 30 days. I expect progress with regular attendance         Core Components/Risk Factors/Patient Goals at Discharge (Final Review):      Goals and Risk Factor Review - 12/31/15 1357      Core Components/Risk Factors/Patient Goals Review   Personal Goals Review Weight Management/Obesity;Sedentary;Increase knowledge of respiratory medications and ability to use respiratory devices properly.;Develop more efficient breathing techniques such as purse lipped breathing and diaphragmatic breathing and practicing self-pacing with activity.;Increase Strength and Stamina   Review Has been absent last week due to an illness, attendance has been poor, I question her diligence in the program.   Expected Outcomes I expect progress with regular attendance      ITP Comments:   Comments:ITP REVIEW Pt is making expected progress toward pulmonary rehab goals after completing 7 sessions. Recommend continued exercise, life  style modification, education, and utilization of breathing techniques to increase stamina and strength and decrease shortness of breath with exertion.

## 2016-01-10 NOTE — Progress Notes (Signed)
[name] presents to pulmonary rehab for her bi-weekly exercise session. I have completed her thirty day face to face review and determined that [name] is on track for meeting their pulmonary rehab goals. There are no barriers identified that will prevent them from continuing their exercise in pulmonary rehab as prescribed.   Rush Farmer, M.D. [facility] Pulmonary/Critical Care Medicine. Pager: [phone]. After hours pager: [phone].

## 2016-01-15 ENCOUNTER — Telehealth (HOSPITAL_COMMUNITY): Payer: Self-pay | Admitting: *Deleted

## 2016-01-15 ENCOUNTER — Encounter (HOSPITAL_COMMUNITY): Payer: Medicare Other

## 2016-01-17 ENCOUNTER — Encounter (HOSPITAL_COMMUNITY)
Admission: RE | Admit: 2016-01-17 | Discharge: 2016-01-17 | Disposition: A | Discharge status: Home / Self Care | Payer: Medicare Other | Source: Ambulatory Visit | Attending: Pulmonary Disease | Admitting: Pulmonary Disease

## 2016-01-17 ENCOUNTER — Ambulatory Visit: Payer: Medicare Other

## 2016-01-17 VITALS — Wt 254.0 lb

## 2016-01-17 DIAGNOSIS — Z9981 Dependence on supplemental oxygen: Secondary | ICD-10-CM | POA: Diagnosis not present

## 2016-01-17 DIAGNOSIS — J449 Chronic obstructive pulmonary disease, unspecified: Secondary | ICD-10-CM | POA: Diagnosis not present

## 2016-01-17 NOTE — Progress Notes (Signed)
Daily Session Note  Patient Details  Name: Beth [name] MRN: [id number] Date of Birth: 03-17-1940 Referring Provider:   Flowsheet Row PULMONARY REHAB COPD ORIENTATION from 11/16/2015 in [place]  Referring Provider  Dr. Elsworth Soho      Encounter Date: 01/17/2016  Check In:     Session Check In - 01/17/16 1334      Check-In   Location [facility]   Staff Present Rosebud Poles, RN, BSN;Molly diVincenzo, MS, ACSM RCEP, Exercise Physiologist;Portia Rollene Rotunda, RN, Roque Cash, RN   Supervising physician immediately available to respond to emergencies [facility] immediately available   Physician(s) Dr Tana Coast   Lytle Michaels or balance concerns reported    No   Warm-up and Cool-down Performed as group-led instruction   Resistance Training Performed Yes   VAD Patient? No     Pain Assessment   Currently in Pain? No/denies   Multiple Pain Sites No      Capillary Blood Glucose: No results found for this or any previous visit (from the past 24 hour(s)).      Exercise Prescription Changes - 01/17/16 1600      Response to Exercise   Blood Pressure (Admit) 104/60   Blood Pressure (Exercise) 130/76   Blood Pressure (Exit) 100/60   Heart Rate (Admit) 68 bpm   Heart Rate (Exercise) 88 bpm   Heart Rate (Exit) 75 bpm   Oxygen Saturation (Admit) 90 %   Oxygen Saturation (Exercise) 89 %   Oxygen Saturation (Exit) 96 %   Rating of Perceived Exertion (Exercise) 14   Perceived Dyspnea (Exercise) 3   Duration Progress to 45 minutes of aerobic exercise without signs/symptoms of physical distress   Intensity THRR unchanged     Progression   Progression Continue to progress workloads to maintain intensity without signs/symptoms of physical distress.     Resistance Training   Training Prescription Yes   Weight green bands   Reps 10-12  10 minutes of strength training     Interval Training   Interval Training No     Oxygen   Oxygen Continuous    Liters 2-4     NuStep   Level 2   Minutes 17   METs 1.5     Track   Laps 7   Minutes 17     Goals Met:  Exercise tolerated well No report of cardiac concerns or symptoms Strength training completed today  Goals Unmet:  Not Applicable  Comments: Service time is from 1330 to 1530     Dr. Rush Farmer is Medical Director for Pulmonary Rehab at [facility].

## 2016-01-22 ENCOUNTER — Encounter: Payer: Medicare Other | Attending: Family Medicine | Admitting: *Deleted

## 2016-01-22 ENCOUNTER — Encounter (HOSPITAL_COMMUNITY)
Admission: RE | Admit: 2016-01-22 | Discharge: 2016-01-22 | Disposition: A | Discharge status: Home / Self Care | Payer: Medicare Other | Source: Ambulatory Visit | Attending: Pulmonary Disease | Admitting: Pulmonary Disease

## 2016-01-22 DIAGNOSIS — J449 Chronic obstructive pulmonary disease, unspecified: Secondary | ICD-10-CM | POA: Diagnosis not present

## 2016-01-22 DIAGNOSIS — Z713 Dietary counseling and surveillance: Secondary | ICD-10-CM | POA: Insufficient documentation

## 2016-01-22 DIAGNOSIS — Z9981 Dependence on supplemental oxygen: Secondary | ICD-10-CM | POA: Diagnosis not present

## 2016-01-22 DIAGNOSIS — E119 Type 2 diabetes mellitus without complications: Secondary | ICD-10-CM | POA: Insufficient documentation

## 2016-01-22 NOTE — Progress Notes (Signed)
Daily Session Note  Patient Details  Name: Beth [name] MRN: [id number] Date of Birth: 1940/10/01 Referring Provider:   Flowsheet Row PULMONARY REHAB COPD ORIENTATION from 11/16/2015 in [place]  Referring Provider  Dr. Elsworth Soho      Encounter Date: 01/22/2016  Check In:     Session Check In - 01/22/16 1030      Check-In   Location [facility]   Staff Present Rosebud Poles, RN, BSN;Molly diVincenzo, MS, ACSM RCEP, Exercise Physiologist;Tanda Morrissey Rollene Rotunda, RN, Roque Cash, RN   Supervising physician immediately available to respond to emergencies [facility] immediately available   Physician(s) Dr Tana Coast   Medication changes reported     No   Fall or balance concerns reported    No   Warm-up and Cool-down Performed as group-led instruction   Resistance Training Performed Yes   VAD Patient? No     Pain Assessment   Currently in Pain? No/denies   Multiple Pain Sites No      Capillary Blood Glucose: No results found for this or any previous visit (from the past 24 hour(s)).      Exercise Prescription Changes - 01/22/16 1217      Response to Exercise   Blood Pressure (Admit) 126/70   Blood Pressure (Exercise) 130/74   Blood Pressure (Exit) 100/62   Heart Rate (Admit) 78 bpm   Heart Rate (Exercise) 86 bpm   Heart Rate (Exit) 78 bpm   Oxygen Saturation (Admit) 93 %   Oxygen Saturation (Exercise) 91 %   Oxygen Saturation (Exit) 94 %   Rating of Perceived Exertion (Exercise) 17   Perceived Dyspnea (Exercise) 3   Duration Progress to 45 minutes of aerobic exercise without signs/symptoms of physical distress   Intensity THRR unchanged     Progression   Progression Continue to progress workloads to maintain intensity without signs/symptoms of physical distress.     Resistance Training   Training Prescription Yes   Weight green bands   Reps 10-12  10 minutes of strength training     Interval Training   Interval Training  No     Oxygen   Oxygen Continuous   Liters 2-4     NuStep   Level 2   Minutes 17   METs 1.4     Track   Laps 3   Minutes 17     Goals Met:  No report of cardiac concerns or symptoms Strength training completed today  Goals Unmet:  RPE PD  Comments: Service time is from 1030 to 1200   Dr. Rush Farmer is Medical Director for Pulmonary Rehab at [facility]

## 2016-01-22 NOTE — Progress Notes (Signed)

## 2016-01-24 ENCOUNTER — Encounter (HOSPITAL_COMMUNITY)
Admission: RE | Admit: 2016-01-24 | Discharge: 2016-01-24 | Disposition: A | Discharge status: Home / Self Care | Payer: Medicare Other | Source: Ambulatory Visit | Attending: Pulmonary Disease | Admitting: Pulmonary Disease

## 2016-01-24 DIAGNOSIS — J449 Chronic obstructive pulmonary disease, unspecified: Secondary | ICD-10-CM

## 2016-01-24 NOTE — Progress Notes (Signed)
Pulmonary Individual Treatment Plan  Patient Details  Name: Beth [name] MRN: [id number] Date of Birth: 1940/07/20 Referring Provider:   Flowsheet Row PULMONARY REHAB COPD ORIENTATION from 11/16/2015 in [place]  Referring Provider  Dr. Elsworth Soho      Initial Encounter Date:  Flowsheet Row PULMONARY REHAB COPD ORIENTATION from 11/16/2015 in [place]  Date  11/20/15  Referring Provider  Dr. Elsworth Soho      Visit Diagnosis: Chronic obstructive pulmonary disease, unspecified COPD type ([place])  Patient's Home Medications on Admission:   Current Outpatient Prescriptions:  .  albuterol (PROVENTIL HFA;VENTOLIN HFA) 108 (90 Base) MCG/ACT inhaler, Inhale 2 puffs into the lungs every 4 (four) hours as needed for wheezing or shortness of breath., Disp: 1 Inhaler, Rfl: 5 .  aspirin EC 81 MG tablet, Take 81 mg by mouth daily., Disp: , Rfl:  .  atenolol (TENORMIN) 50 MG tablet, TAKE 1/2 TABLET TWICE A DAY, Disp: , Rfl:  .  captopril (CAPOTEN) 50 MG tablet, Take 50 mg by mouth 2 (two) times daily., Disp: , Rfl:  .  Cholecalciferol (VITAMIN D3) 2000 units capsule, 1 tablet, Disp: , Rfl:  .  clonazePAM (KLONOPIN) 2 MG tablet, Take 1 tablet (2 mg total) by mouth at bedtime., Disp: 90 tablet, Rfl: 1 .  doxycycline (VIBRA-TABS) 100 MG tablet, Take 1 tablet (100 mg total) by mouth 2 (two) times daily. Take with food., Disp: 20 tablet, Rfl: 0 .  fexofenadine (ALLEGRA) 180 MG tablet, Take 180 mg by mouth daily. , Disp: , Rfl:  .  fluticasone (FLONASE) 50 MCG/ACT nasal spray, Place 2 sprays into the nose daily as needed for allergies. Spray twice daily as needed, Disp: , Rfl:  .  guaiFENesin (MUCINEX) 600 MG 12 hr tablet, Take 1,200 mg by mouth 2 (two) times daily as needed for cough. , Disp: , Rfl:  .  KEPPRA 750 MG tablet, Take 1 tablet (750 mg total) by mouth 2 (two) times daily., Disp: 180 tablet, Rfl: 3 .  LEXAPRO 20 MG tablet, Take 1 tablet (20 mg  total) by mouth daily. Morning dosage (Patient taking differently: Take 20 mg by mouth daily. ), Disp: 90 tablet, Rfl: 3 .  meloxicam (MOBIC) 15 MG tablet, 1 tablet, Disp: , Rfl:  .  simvastatin (ZOCOR) 10 MG tablet, Take 10 mg by mouth at bedtime., Disp: , Rfl:  .  triamterene-hydrochlorothiazide (MAXZIDE-25) 37.5-25 MG tablet, Take 1 tablet by mouth daily., Disp: , Rfl:  .  umeclidinium-vilanterol (ANORO ELLIPTA) 62.5-25 MCG/INH AEPB, 1 puff, Disp: , Rfl:   Past Medical History: Past Medical History:  Diagnosis Date  . BRCA2 positive 2017  . Diverticulosis   . Hearing loss   . HTN (hypertension)   . Memory loss   . 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 Seizures ([place])   . 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   . Vestibulitis of ear     Tobacco Use: History  Smoking Status  . Former Smoker  . Packs/day: 1.00  . Years: 41.00  . Quit date: 01/13/2006  Smokeless Tobacco  . Never Used    Labs: Recent Review Flowsheet Data    Labs for ITP Cardiac and Pulmonary Rehab Latest Ref Rng & Units 04/16/2007 07/05/2008   Cholestrol - 146 ATP III CLASSIFICATION: <200     mg/dL   Desirable 200-239  mg/dL   Borderline High >=240  mg/dL   High -   LDLCALC - 84 Total Cholesterol/HDL:CHD Risk Coronary Heart Disease Risk Table Men   Women 1/2 Average Risk   3.4   3.3 -   HDL - 47 -   Trlycerides - 75 -   TCO2 0 - 100 mmol/L - 23      Capillary Blood Glucose: Lab Results  Component Value Date   GLUCAP 117 (H) 06/10/2008     ADL UCSD:     Pulmonary Assessment Scores    Row Name 12/04/15 1701         ADL UCSD   ADL Phase Entry     SOB Score total 43       CAT Score   CAT Score 11        Pulmonary Function Assessment:     Pulmonary Function Assessment - 11/16/15 1555      Breath   Bilateral Breath Sounds Decreased   Shortness of Breath Limiting activity      Exercise Target Goals:    Exercise  Program Goal: Individual exercise prescription set with THRR, safety & activity barriers. Participant demonstrates ability to understand and report RPE using BORG scale, to self-measure pulse accurately, and to acknowledge the importance of the exercise prescription.  Exercise Prescription Goal: Starting with aerobic activity 30 plus minutes a day, 3 days per week for initial exercise prescription. Provide home exercise prescription and guidelines that participant acknowledges understanding prior to discharge.  Activity Barriers & Risk Stratification:   6 Minute Walk:     6 Minute Walk    Row Name 11/20/15 0908         6 Minute Walk   Phase Initial     Distance 810 feet     Walk Time -  4 minutes 30 seconds     # of Rest Breaks 1  1 minute 30 seconds     RPE 14     Perceived Dyspnea  2     Symptoms Yes (comment)     Comments leg fatigue, ankle pain     Resting HR 69 bpm     Resting BP 130/80     Max Ex. HR 98 bpm     Max Ex. BP 148/80       Interval HR   Baseline HR 69     1 Minute HR 85     2 Minute HR 92     3 Minute HR 98     4 Minute HR 77     5 Minute HR 90     6 Minute HR 96     2 Minute Post HR 77     Interval Heart Rate? Yes       Interval Oxygen   Interval Oxygen? Yes     Baseline Oxygen Saturation % 90 %     Baseline Liters of Oxygen 0 L     1 Minute Oxygen Saturation % 85 %     1 Minute Liters of Oxygen 0 L     2 Minute Oxygen Saturation % 77 %     2 Minute Liters of Oxygen 0 L     3 Minute Oxygen Saturation % 74 %     3 Minute Liters of Oxygen 2 L     4 Minute Oxygen Saturation % 94 %     4 Minute Liters of Oxygen 4 L     5 Minute Oxygen Saturation % 94 %  5 Minute Liters of Oxygen 4 L     6 Minute Oxygen Saturation % 87 %     6 Minute Liters of Oxygen 4 L     2 Minute Post Oxygen Saturation % 94 %     2 Minute Post Liters of Oxygen 4 L        Initial Exercise Prescription:     Initial Exercise Prescription - 11/20/15 1600      Date  of Initial Exercise RX and Referring Provider   Date 11/20/15   Referring Provider Dr. Elsworth Soho     Oxygen   Oxygen Continuous   Liters 4     Recumbant Bike   Level 3   Minutes 17     NuStep   Level 2   Minutes 17   METs 1.5     Track   Laps 5   Minutes 17     Prescription Details   Frequency (times per week) 2   Duration Progress to 45 minutes of aerobic exercise without signs/symptoms of physical distress     Intensity   THRR 40-80% of Max Heartrate 58-117   Ratings of Perceived Exertion 11-13   Perceived Dyspnea 0-4     Progression   Progression Continue progressive overload as per policy without signs/symptoms or physical distress.     Resistance Training   Training Prescription Yes   Weight green bands   Reps 10-12      Perform Capillary Blood Glucose checks as needed.  Exercise Prescription Changes:     Exercise Prescription Changes    Row Name 11/29/15 1600 12/11/15 1600 12/13/15 1500 12/18/15 1500 12/20/15 1557     Response to Exercise   Blood Pressure (Admit) 120/80 126/76 112/70 122/50 116/80   Blood Pressure (Exercise) 150/76 132/70 140/80 110/70 130/70   Blood Pressure (Exit) 120/72 122/70 112/60 104/60 108/62   Heart Rate (Admit) 65 bpm 64 bpm 63 bpm 75 bpm 70 bpm   Heart Rate (Exercise) 84 bpm 80 bpm 73 bpm 87 bpm 95 bpm   Heart Rate (Exit) 75 bpm 66 bpm 70 bpm 72 bpm 70 bpm   Oxygen Saturation (Admit) 86 % 94 % 95 % 92 % 94 %   Oxygen Saturation (Exercise) 89 % 89 % 95 % 92 % 88 %   Oxygen Saturation (Exit) 95 % 94 % 96 % 95 % 95 %   Rating of Perceived Exertion (Exercise) '15 15 13 13 15   '$ Perceived Dyspnea (Exercise) '2 3 3 2 3   '$ Duration Progress to 45 minutes of aerobic exercise without signs/symptoms of physical distress Progress to 45 minutes of aerobic exercise without signs/symptoms of physical distress Progress to 45 minutes of aerobic exercise without signs/symptoms of physical distress Progress to 45 minutes of aerobic exercise without  signs/symptoms of physical distress Progress to 45 minutes of aerobic exercise without signs/symptoms of physical distress   Intensity -  40-80% HRR THRR unchanged THRR unchanged THRR unchanged THRR unchanged     Progression   Progression Continue to progress workloads to maintain intensity without signs/symptoms of physical distress.  - Continue to progress workloads to maintain intensity without signs/symptoms of physical distress. Continue to progress workloads to maintain intensity without signs/symptoms of physical distress. Continue to progress workloads to maintain intensity without signs/symptoms of physical distress.     Resistance Training   Training Prescription Yes Yes Yes Yes Yes   Weight green bands  10 min green bands green bands green bands green  bands   Reps 10-12 10-12  10 minutes of strength training 10-12  10 minutes of strength training 10-12  10 minutes of strength training 10-12  10 minutes of strength training     Interval Training   Interval Training  -  - No No No     Oxygen   Oxygen Continuous Continuous Continuous Continuous Continuous   Liters 4 2-4 2-4 2-4 2-4     Recumbant Bike   Level 1 1  - 1  -   Minutes 17 17  - 17  -     NuStep   Level  - '2 2 2 2   '$ Minutes  - '17 17 17 17   '$ METs  - 1.1 1.5 1.4 1.5     Track   Laps '5 6 4 3 4   '$ Minutes '17 17 17 17 17   '$ Row Name 01/01/16 1635 01/03/16 1500 01/10/16 1600 01/17/16 1600 01/22/16 1217     Response to Exercise   Blood Pressure (Admit) 118/60 130/72 110/60 104/60 126/70   Blood Pressure (Exercise) 140/84 174/84 156/62 130/76 130/74   Blood Pressure (Exit) 108/60 104/40 120/66 100/60 100/62   Heart Rate (Admit) 73 bpm 71 bpm 67 bpm 68 bpm 78 bpm   Heart Rate (Exercise) 92 bpm 89 bpm 75 bpm 88 bpm 86 bpm   Heart Rate (Exit) 74 bpm 74 bpm 76 bpm 75 bpm 78 bpm   Oxygen Saturation (Admit) 91 % 92 % 95 % 90 % 93 %   Oxygen Saturation (Exercise) 87 % 93 % 96 % 89 % 91 %   Oxygen Saturation (Exit) 94 %  96 % 91 % 96 % 94 %   Rating of Perceived Exertion (Exercise) '15 14 13 14 17   '$ Perceived Dyspnea (Exercise) '3 3 2 3 3   '$ Duration Progress to 45 minutes of aerobic exercise without signs/symptoms of physical distress Progress to 45 minutes of aerobic exercise without signs/symptoms of physical distress Progress to 45 minutes of aerobic exercise without signs/symptoms of physical distress Progress to 45 minutes of aerobic exercise without signs/symptoms of physical distress Progress to 45 minutes of aerobic exercise without signs/symptoms of physical distress   Intensity THRR unchanged THRR unchanged THRR unchanged THRR unchanged THRR unchanged     Progression   Progression Continue to progress workloads to maintain intensity without signs/symptoms of physical distress. Continue to progress workloads to maintain intensity without signs/symptoms of physical distress. Continue to progress workloads to maintain intensity without signs/symptoms of physical distress. Continue to progress workloads to maintain intensity without signs/symptoms of physical distress. Continue to progress workloads to maintain intensity without signs/symptoms of physical distress.     Resistance Training   Training Prescription Yes Yes Yes Yes Yes   Weight green bands green bands green bands green bands green bands   Reps 10-12  10 minutes of strength training 10-12  10 minutes of strength training 10-12  10 minutes of strength training 10-12  10 minutes of strength training 10-12  10 minutes of strength training     Interval Training   Interval Training No No No No No     Oxygen   Oxygen Continuous Continuous Continuous Continuous Continuous   Liters 2-4 2-4 2-4 2-4 2-4     NuStep   Level '2 2 2 2 2   '$ Minutes '17 17 17 17 17   '$ METs 1.5 1.8 1.4 1.5 1.4     Track   Laps 5 3 4  7 3   Minutes 34 '17 17 17 17      '$ Exercise Comments:     Exercise Comments    Row Name 12/17/15 1656 01/03/16 0907          Exercise Comments Patient has only attended three sessions. Will cont. to monitor and progress as able.  Patient is not making a lot of progress with workload intensities. Patient states that she is working at a "very hard" level. Only walked two laps in 15 minutes. I feel that patient can work harder. Will cont. to monitor and progress.          Discharge Exercise Prescription (Final Exercise Prescription Changes):     Exercise Prescription Changes - 01/22/16 1217      Response to Exercise   Blood Pressure (Admit) 126/70   Blood Pressure (Exercise) 130/74   Blood Pressure (Exit) 100/62   Heart Rate (Admit) 78 bpm   Heart Rate (Exercise) 86 bpm   Heart Rate (Exit) 78 bpm   Oxygen Saturation (Admit) 93 %   Oxygen Saturation (Exercise) 91 %   Oxygen Saturation (Exit) 94 %   Rating of Perceived Exertion (Exercise) 17   Perceived Dyspnea (Exercise) 3   Duration Progress to 45 minutes of aerobic exercise without signs/symptoms of physical distress   Intensity THRR unchanged     Progression   Progression Continue to progress workloads to maintain intensity without signs/symptoms of physical distress.     Resistance Training   Training Prescription Yes   Weight green bands   Reps 10-12  10 minutes of strength training     Interval Training   Interval Training No     Oxygen   Oxygen Continuous   Liters 2-4     NuStep   Level 2   Minutes 17   METs 1.4     Track   Laps 3   Minutes 17       Nutrition:  Target Goals: Understanding of nutrition guidelines, daily intake of sodium '1500mg'$ , cholesterol '200mg'$ , calories 30% from fat and 7% or less from saturated fats, daily to have 5 or more servings of fruits and vegetables.  Biometrics:     Pre Biometrics - 11/16/15 1555      Pre Biometrics   Grip Strength 28 kg       Nutrition Therapy Plan and Nutrition Goals:   Nutrition Discharge: Rate Your Plate Scores:   Psychosocial: Target Goals: Acknowledge presence  or absence of depression, maximize coping skills, provide positive support system. Participant is able to verbalize types and ability to use techniques and skills needed for reducing stress and depression.  Initial Review & Psychosocial Screening:     Initial Psych Review & Screening - 11/16/15 1557      Initial Review   Current issues with History of Depression     Family Dynamics   Good Support System? Yes     Barriers   Psychosocial barriers to participate in program There are no identifiable barriers or psychosocial needs.     Screening Interventions   Interventions Encouraged to exercise      Quality of Life Scores:   PHQ-9: Recent Review Flowsheet Data    Depression screen [facility] 2/9 01/22/2016 11/16/2015   Decreased Interest 1 0   Down, Depressed, Hopeless 1 0   PHQ - 2 Score 2 0      Psychosocial Evaluation and Intervention:     Psychosocial Evaluation - 11/16/15 1558  Psychosocial Evaluation & Interventions   Interventions Encouraged to exercise with the program and follow exercise prescription      Psychosocial Re-Evaluation:     Psychosocial Re-Evaluation    Row Name 12/13/15 1008 12/31/15 1359           Psychosocial Re-Evaluation   Interventions Encouraged to attend Pulmonary Rehabilitation for the exercise Encouraged to attend Pulmonary Rehabilitation for the exercise      Comments No psychosocial issues identified at this time. No psychosocial issues identified at this time.      Continued Psychosocial Services Needed No No        Education: Education Goals: Education classes will be provided on a weekly basis, covering required topics. Participant will state understanding/return demonstration of topics presented.  Learning Barriers/Preferences:   Education Topics: Risk Factor Reduction:  -Group instruction that is supported by a PowerPoint presentation. Instructor discusses the definition of a risk factor, different risk factors for  pulmonary disease, and how the heart and lungs work together.     Nutrition for Pulmonary Patient:  -Group instruction provided by PowerPoint slides, verbal discussion, and written materials to support subject matter. The instructor gives an explanation and review of healthy diet recommendations, which includes a discussion on weight management, recommendations for fruit and vegetable consumption, as well as protein, fluid, caffeine, fiber, sodium, sugar, and alcohol. Tips for eating when patients are short of breath are discussed.   Pursed Lip Breathing:  -Group instruction that is supported by demonstration and informational handouts. Instructor discusses the benefits of pursed lip and diaphragmatic breathing and detailed demonstration on how to preform both.     Oxygen Safety:  -Group instruction provided by PowerPoint, verbal discussion, and written material to support subject matter. There is an overview of "What is Oxygen" and "Why do we need it".  Instructor also reviews how to create a safe environment for oxygen use, the importance of using oxygen as prescribed, and the risks of noncompliance. There is a brief discussion on traveling with oxygen and resources the patient may utilize.   Oxygen Equipment:  -Group instruction provided by [facility] Staff utilizing handouts, written materials, and equipment demonstrations. Flowsheet Row PULMONARY REHAB CHRONIC OBSTRUCTIVE PULMONARY DISEASE from 01/17/2016 in [facility] CARDIAC REHAB  Date  01/17/16  Educator  [employer]  Instruction Review Code  2- meets goals/outcomes      Signs and Symptoms:  -Group instruction provided by written material and verbal discussion to support subject matter. Warning signs and symptoms of infection, stroke, and heart attack are reviewed and when to call the physician/911 reinforced. Tips for preventing the spread of infection discussed. Flowsheet Row PULMONARY REHAB CHRONIC OBSTRUCTIVE  PULMONARY DISEASE from 01/17/2016 in [facility] CARDIAC REHAB  Date  12/20/15  Educator  rn  Instruction Review Code  2- meets goals/outcomes      Advanced Directives:  -Group instruction provided by verbal instruction and written material to support subject matter. Instructor reviews Advanced Directive laws and proper instruction for filling out document.   Pulmonary Video:  -Group video education that reviews the importance of medication and oxygen compliance, exercise, good nutrition, pulmonary hygiene, and pursed lip and diaphragmatic breathing for the pulmonary patient. Flowsheet Row PULMONARY REHAB CHRONIC OBSTRUCTIVE PULMONARY DISEASE from 01/17/2016 in [facility] CARDIAC REHAB  Date  01/10/16  Educator  video  Instruction Review Code  R- Review/reinforce      Exercise for the Pulmonary Patient:  -Group instruction that is  supported by a PowerPoint presentation. Instructor discusses benefits of exercise, core components of exercise, frequency, duration, and intensity of an exercise routine, importance of utilizing pulse oximetry during exercise, safety while exercising, and options of places to exercise outside of rehab.   Flowsheet Row PULMONARY REHAB CHRONIC OBSTRUCTIVE PULMONARY DISEASE from 01/17/2016 in [place]  Date  12/13/15  Educator  EP  Instruction Review Code  2- meets goals/outcomes      Pulmonary Medications:  -Verbally interactive group education provided by instructor with focus on inhaled medications and proper administration.   Anatomy and Physiology of the Respiratory System and Intimacy:  -Group instruction provided by PowerPoint, verbal discussion, and written material to support subject matter. Instructor reviews respiratory cycle and anatomical components of the respiratory system and their functions. Instructor also reviews differences in obstructive and restrictive respiratory diseases  with examples of each. Intimacy, Sex, and Sexuality differences are reviewed with a discussion on how relationships can change when diagnosed with pulmonary disease. Common sexual concerns are reviewed.   Knowledge Questionnaire Score:     Knowledge Questionnaire Score - 12/04/15 1701      Knowledge Questionnaire Score   Pre Score 10/13      Core Components/Risk Factors/Patient Goals at Admission:     Personal Goals and Risk Factors at Admission - 11/16/15 1555      Core Components/Risk Factors/Patient Goals on Admission    Weight Management Obesity;Yes   Intervention Obesity: Provide education and appropriate resources to help participant work on and attain dietary goals.   Expected Outcomes Understanding recommendations for meals to include 15-35% energy as protein, 25-35% energy from fat, 35-60% energy from carbohydrates, less than '200mg'$  of dietary cholesterol, 20-35 gm of total fiber daily;Understanding of distribution of calorie intake throughout the day with the consumption of 4-5 meals/snacks;Short Term: Continue to assess and modify interventions until short term weight is achieved   Sedentary Yes   Intervention Provide advice, education, support and counseling about physical activity/exercise needs.;Develop an individualized exercise prescription for aerobic and resistive training based on initial evaluation findings, risk stratification, comorbidities and participant's personal goals.   Expected Outcomes Achievement of increased cardiorespiratory fitness and enhanced flexibility, muscular endurance and strength shown through measurements of functional capacity and personal statement of participant.   Increase Strength and Stamina Yes   Intervention Provide advice, education, support and counseling about physical activity/exercise needs.;Develop an individualized exercise prescription for aerobic and resistive training based on initial evaluation findings, risk stratification,  comorbidities and participant's personal goals.   Expected Outcomes Achievement of increased cardiorespiratory fitness and enhanced flexibility, muscular endurance and strength shown through measurements of functional capacity and personal statement of participant.   Improve shortness of breath with ADL's Yes   Intervention Provide education, individualized exercise plan and daily activity instruction to help decrease symptoms of SOB with activities of daily living.   Expected Outcomes Short Term: Achieves a reduction of symptoms when performing activities of daily living.   Develop more efficient breathing techniques such as purse lipped breathing and diaphragmatic breathing; and practicing self-pacing with activity Yes   Intervention Provide education, demonstration and support about specific breathing techniuqes utilized for more efficient breathing. Include techniques such as pursed lipped breathing, diaphragmatic breathing and self-pacing activity.   Expected Outcomes Short Term: Participant will be able to demonstrate and use breathing techniques as needed throughout daily activities.      Core Components/Risk Factors/Patient Goals Review:      Goals and Risk  Factor Review    Row Name 12/13/15 1000 12/31/15 1357           Core Components/Risk Factors/Patient Goals Review   Personal Goals Review Weight Management/Obesity;Sedentary;Improve shortness of breath with ADL's;Increase knowledge of respiratory medications and ability to use respiratory devices properly.;Develop more efficient breathing techniques such as purse lipped breathing and diaphragmatic breathing and practicing self-pacing with activity.;Increase Strength and Stamina Weight Management/Obesity;Sedentary;Increase knowledge of respiratory medications and ability to use respiratory devices properly.;Develop more efficient breathing techniques such as purse lipped breathing and diaphragmatic breathing and practicing self-pacing  with activity.;Increase Strength and Stamina      Review Has only attended 2 exercises sessions, too early to see progress. Has been absent last week due to an illness, attendance has been poor, I question her diligence in the program.      Expected Outcomes Expect progress in the next 30 days. I expect progress with regular attendance         Core Components/Risk Factors/Patient Goals at Discharge (Final Review):      Goals and Risk Factor Review - 12/31/15 1357      Core Components/Risk Factors/Patient Goals Review   Personal Goals Review Weight Management/Obesity;Sedentary;Increase knowledge of respiratory medications and ability to use respiratory devices properly.;Develop more efficient breathing techniques such as purse lipped breathing and diaphragmatic breathing and practicing self-pacing with activity.;Increase Strength and Stamina   Review Has been absent last week due to an illness, attendance has been poor, I question her diligence in the program.   Expected Outcomes I expect progress with regular attendance      ITP Comments:   Comments: ITP REVIEW Pt is being discharged from the program after attending 10 exercise sessions at her request.  She does not feel the program is for her and is having difficulty with depression and motivation.

## 2016-01-24 NOTE — Progress Notes (Signed)
Beth [name] 76 y.o. female  81 day and Final Psychosocial Note  Patient psychosocial assessment reveals  barriers to participation in Pulmonary Rehab. Psychosocial areas that are affecting her participation is her participation.  She is having difficulty staying motivated due to depression.   She feels this program is not for her and would like to be discharged.   Patient does not continue to exhibit positive coping skills to deal with her psychosocial concerns. Offered emotional support and reassurance. Patient does not feel she is making progress toward Pulmonary Rehab goals. Patient reports her health and activity level has not improved in the past 30 days as evidenced by patient's report of not significantly changed. Patient states family/friends have not noticed changes in her activity or mood. Patient reports not feeling positive about current and projected progression in Pulmonary Rehab. After reviewing the patient's treatment plan, the patient is not making progress toward Pulmonary Rehab goals. Patient's rate of progress toward rehab goals is inadequate. She is being discharged from pulmonary rehab by patient request.  She has been on an anti-depressant in the past, discussed with her husband to seek advice from her PCP to possibly go back on this medication.

## 2016-01-28 NOTE — Addendum Note (Signed)
Encounter addended by: Feliz Beam Kenn Rekowski, RCEP on: 01/28/2016  3:34 PM<BR>    Actions taken: Visit Navigator Flowsheet section accepted

## 2016-01-29 ENCOUNTER — Encounter (HOSPITAL_COMMUNITY): Payer: Medicare Other

## 2016-01-29 DIAGNOSIS — E119 Type 2 diabetes mellitus without complications: Secondary | ICD-10-CM | POA: Diagnosis not present

## 2016-01-29 DIAGNOSIS — Z713 Dietary counseling and surveillance: Secondary | ICD-10-CM | POA: Diagnosis not present

## 2016-01-31 ENCOUNTER — Encounter (HOSPITAL_COMMUNITY): Payer: Medicare Other

## 2016-02-04 NOTE — Progress Notes (Signed)
Pulmonary Individual Treatment Plan  Patient Details  Name: Beth [name] MRN: [id number] Date of Birth: 1940/07/20 Referring Provider:   Flowsheet Row PULMONARY REHAB COPD ORIENTATION from 11/16/2015 in [place]  Referring Provider  Dr. Elsworth Soho      Initial Encounter Date:  Flowsheet Row PULMONARY REHAB COPD ORIENTATION from 11/16/2015 in [place]  Date  11/20/15  Referring Provider  Dr. Elsworth Soho      Visit Diagnosis: Chronic obstructive pulmonary disease, unspecified COPD type ([place])  Patient's Home Medications on Admission:   Current Outpatient Prescriptions:  .  albuterol (PROVENTIL HFA;VENTOLIN HFA) 108 (90 Base) MCG/ACT inhaler, Inhale 2 puffs into the lungs every 4 (four) hours as needed for wheezing or shortness of breath., Disp: 1 Inhaler, Rfl: 5 .  aspirin EC 81 MG tablet, Take 81 mg by mouth daily., Disp: , Rfl:  .  atenolol (TENORMIN) 50 MG tablet, TAKE 1/2 TABLET TWICE A DAY, Disp: , Rfl:  .  captopril (CAPOTEN) 50 MG tablet, Take 50 mg by mouth 2 (two) times daily., Disp: , Rfl:  .  Cholecalciferol (VITAMIN D3) 2000 units capsule, 1 tablet, Disp: , Rfl:  .  clonazePAM (KLONOPIN) 2 MG tablet, Take 1 tablet (2 mg total) by mouth at bedtime., Disp: 90 tablet, Rfl: 1 .  doxycycline (VIBRA-TABS) 100 MG tablet, Take 1 tablet (100 mg total) by mouth 2 (two) times daily. Take with food., Disp: 20 tablet, Rfl: 0 .  fexofenadine (ALLEGRA) 180 MG tablet, Take 180 mg by mouth daily. , Disp: , Rfl:  .  fluticasone (FLONASE) 50 MCG/ACT nasal spray, Place 2 sprays into the nose daily as needed for allergies. Spray twice daily as needed, Disp: , Rfl:  .  guaiFENesin (MUCINEX) 600 MG 12 hr tablet, Take 1,200 mg by mouth 2 (two) times daily as needed for cough. , Disp: , Rfl:  .  KEPPRA 750 MG tablet, Take 1 tablet (750 mg total) by mouth 2 (two) times daily., Disp: 180 tablet, Rfl: 3 .  LEXAPRO 20 MG tablet, Take 1 tablet (20 mg  total) by mouth daily. Morning dosage (Patient taking differently: Take 20 mg by mouth daily. ), Disp: 90 tablet, Rfl: 3 .  meloxicam (MOBIC) 15 MG tablet, 1 tablet, Disp: , Rfl:  .  simvastatin (ZOCOR) 10 MG tablet, Take 10 mg by mouth at bedtime., Disp: , Rfl:  .  triamterene-hydrochlorothiazide (MAXZIDE-25) 37.5-25 MG tablet, Take 1 tablet by mouth daily., Disp: , Rfl:  .  umeclidinium-vilanterol (ANORO ELLIPTA) 62.5-25 MCG/INH AEPB, 1 puff, Disp: , Rfl:   Past Medical History: Past Medical History:  Diagnosis Date  . BRCA2 positive 2017  . Diverticulosis   . Hearing loss   . HTN (hypertension)   . Memory loss   . 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 Seizures ([place])   . 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   . Vestibulitis of ear     Tobacco Use: History  Smoking Status  . Former Smoker  . Packs/day: 1.00  . Years: 41.00  . Quit date: 01/13/2006  Smokeless Tobacco  . Never Used    Labs: Recent Review Flowsheet Data    Labs for ITP Cardiac and Pulmonary Rehab Latest Ref Rng & Units 04/16/2007 07/05/2008   Cholestrol - 146 ATP III CLASSIFICATION: <200     mg/dL   Desirable 200-239  mg/dL   Borderline High >=240  mg/dL   High -   LDLCALC - 84 Total Cholesterol/HDL:CHD Risk Coronary Heart Disease Risk Table Men   Women 1/2 Average Risk   3.4   3.3 -   HDL - 47 -   Trlycerides - 75 -   TCO2 0 - 100 mmol/L - 23      Capillary Blood Glucose: Lab Results  Component Value Date   GLUCAP 117 (H) 06/10/2008     ADL UCSD:     Pulmonary Assessment Scores    Row Name 12/04/15 1701         ADL UCSD   ADL Phase Entry     SOB Score total 43       CAT Score   CAT Score 11        Pulmonary Function Assessment:     Pulmonary Function Assessment - 11/16/15 1555      Breath   Bilateral Breath Sounds Decreased   Shortness of Breath Limiting activity      Exercise Target Goals:    Exercise  Program Goal: Individual exercise prescription set with THRR, safety & activity barriers. Participant demonstrates ability to understand and report RPE using BORG scale, to self-measure pulse accurately, and to acknowledge the importance of the exercise prescription.  Exercise Prescription Goal: Starting with aerobic activity 30 plus minutes a day, 3 days per week for initial exercise prescription. Provide home exercise prescription and guidelines that participant acknowledges understanding prior to discharge.  Activity Barriers & Risk Stratification:   6 Minute Walk:     6 Minute Walk    Row Name 11/16/15 1525 11/20/15 0908       6 Minute Walk   Phase Initial -    Distance 810 feet -    Walk Time -  4 minutes and 30 seconds -  4 minutes 30 seconds    # of Rest Breaks 1  1 minute 30 seconds -    RPE 14 -    Perceived Dyspnea  2 -    Symptoms No  leg fatigue, ankle pain -    Comments  - -    Resting HR 96 bpm -    Resting BP 130/80 -    Max Ex. HR 96 bpm -    Max Ex. BP 148/80 -      Interval HR   Baseline HR 69 -    1 Minute HR 85 -    2 Minute HR 92 -    3 Minute HR 98 -    4 Minute HR 77 -    5 Minute HR 90 -    6 Minute HR 96 -    2 Minute Post HR 77 -    Interval Heart Rate? Yes -      Interval Oxygen   Interval Oxygen? Yes -    Baseline Oxygen Saturation % 90 % -    Baseline Liters of Oxygen 0 L -    1 Minute Oxygen Saturation % 85 % -    1 Minute Liters of Oxygen 0 L -    2 Minute Oxygen Saturation % 77 % -    2 Minute Liters of Oxygen 0 L -    3 Minute Oxygen Saturation % 74 % -    3 Minute Liters of Oxygen 2 L -    4 Minute Oxygen Saturation % 94 %  rest break -    4 Minute Liters of Oxygen 4 L -  5 Minute Oxygen Saturation % 94 % -    5 Minute Liters of Oxygen 4 L -    6 Minute Oxygen Saturation % 87 % -    6 Minute Liters of Oxygen 4 L -    2 Minute Post Oxygen Saturation % 94 % -    2 Minute Post Liters of Oxygen 4 L -       Initial  Exercise Prescription:     Initial Exercise Prescription - 11/20/15 1600      Date of Initial Exercise RX and Referring Provider   Date 11/20/15   Referring Provider Dr. Elsworth Soho     Oxygen   Oxygen Continuous   Liters 4     Recumbant Bike   Level 3   Minutes 17     NuStep   Level 2   Minutes 17   METs 1.5     Track   Laps 5   Minutes 17     Prescription Details   Frequency (times per week) 2   Duration Progress to 45 minutes of aerobic exercise without signs/symptoms of physical distress     Intensity   THRR 40-80% of Max Heartrate 58-117   Ratings of Perceived Exertion 11-13   Perceived Dyspnea 0-4     Progression   Progression Continue progressive overload as per policy without signs/symptoms or physical distress.     Resistance Training   Training Prescription Yes   Weight green bands   Reps 10-12      Perform Capillary Blood Glucose checks as needed.  Exercise Prescription Changes:     Exercise Prescription Changes    Row Name 11/29/15 1600 12/11/15 1600 12/13/15 1500 12/18/15 1500 12/20/15 1557     Response to Exercise   Blood Pressure (Admit) 120/80 126/76 112/70 122/50 116/80   Blood Pressure (Exercise) 150/76 132/70 140/80 110/70 130/70   Blood Pressure (Exit) 120/72 122/70 112/60 104/60 108/62   Heart Rate (Admit) 65 bpm 64 bpm 63 bpm 75 bpm 70 bpm   Heart Rate (Exercise) 84 bpm 80 bpm 73 bpm 87 bpm 95 bpm   Heart Rate (Exit) 75 bpm 66 bpm 70 bpm 72 bpm 70 bpm   Oxygen Saturation (Admit) 86 % 94 % 95 % 92 % 94 %   Oxygen Saturation (Exercise) 89 % 89 % 95 % 92 % 88 %   Oxygen Saturation (Exit) 95 % 94 % 96 % 95 % 95 %   Rating of Perceived Exertion (Exercise) _0 Perceived Dyspnea (Exercise) _1 Duration Progress to 45 minutes of aerobic exercise without signs/symptoms of physical distress Progress to 45 minutes of aerobic exercise without signs/symptoms of physical distress Progress to 45 minutes of aerobic exercise without  signs/symptoms of physical distress Progress to 45 minutes of aerobic exercise without signs/symptoms of physical distress Progress to 45 minutes of aerobic exercise without signs/symptoms of physical distress   Intensity -  40-80% HRR THRR unchanged THRR unchanged THRR unchanged THRR unchanged     Progression   Progression Continue to progress workloads to maintain intensity without signs/symptoms of physical distress.  - Continue to progress workloads to maintain intensity without signs/symptoms of physical distress. Continue to progress workloads to maintain intensity without signs/symptoms of physical distress. Continue to progress workloads to maintain intensity without signs/symptoms of physical distress.     Resistance Training   Training Prescription _2    Weight green  bands  10 min green bands green bands green bands green bands   Reps 10-12 10-12  10 minutes of strength training 10-12  10 minutes of strength training 10-12  10 minutes of strength training 10-12  10 minutes of strength training     Interval Training   Interval Training  -  - No No No     Oxygen   Oxygen _0    Liters 4 2-4 2-4 2-4 2-4     Recumbant Bike   Level 1 1  - 1  -   Minutes 17 17  - 17  -     NuStep   Level  - _1 Minutes  - _2 METs  - 1.1 1.5 1.4 1.5     Track   Laps _3 Minutes _4 Row Name 01/01/16 1635 01/03/16 1500 01/10/16 1600 01/17/16 1600 01/22/16 1217     Response to Exercise   Blood Pressure (Admit) 118/60 130/72 110/60 104/60 126/70   Blood Pressure (Exercise) 140/84 174/84 156/62 130/76 130/74   Blood Pressure (Exit) 108/60 104/40 120/66 100/60 100/62   Heart Rate (Admit) 73 bpm 71 bpm 67 bpm 68 bpm 78 bpm   Heart Rate (Exercise) 92 bpm 89 bpm 75 bpm 88 bpm 86 bpm   Heart Rate (Exit) 74 bpm 74 bpm 76 bpm 75 bpm 78 bpm   Oxygen Saturation (Admit) 91 % 92 % 95 % 90 % 93 %    Oxygen Saturation (Exercise) 87 % 93 % 96 % 89 % 91 %   Oxygen Saturation (Exit) 94 % 96 % 91 % 96 % 94 %   Rating of Perceived Exertion (Exercise) _5 Perceived Dyspnea (Exercise) _6 Duration Progress to 45 minutes of aerobic exercise without signs/symptoms of physical distress Progress to 45 minutes of aerobic exercise without signs/symptoms of physical distress Progress to 45 minutes of aerobic exercise without signs/symptoms of physical distress Progress to 45 minutes of aerobic exercise without signs/symptoms of physical distress Progress to 45 minutes of aerobic exercise without signs/symptoms of physical distress   Intensity _7      Progression   Progression Continue to progress workloads to maintain intensity without signs/symptoms of physical distress. Continue to progress workloads to maintain intensity without signs/symptoms of physical distress. Continue to progress workloads to maintain intensity without signs/symptoms of physical distress. Continue to progress workloads to maintain intensity without signs/symptoms of physical distress. Continue to progress workloads to maintain intensity without signs/symptoms of physical distress.     Resistance Training   Training Prescription _8    Weight _9    Reps 10-12  10 minutes of strength training 10-12  10 minutes of strength training 10-12  10 minutes of strength training 10-12  10 minutes of strength training 10-12  10 minutes of strength training     Interval Training   Interval Training _10      Oxygen   Oxygen _11    Liters 2-4 2-4 2-4 2-4 2-4     NuStep   Level _12 Minutes _13 METs 1.5 1.8 1.4 1.5 1.4  Track   Laps _0 Minutes 34 _1 Exercise  Comments:     Exercise Comments    Row Name 12/17/15 1656 01/03/16 0907         Exercise Comments Patient has only attended three sessions. Will cont. to monitor and progress as able.  Patient is not making a lot of progress with workload intensities. Patient states that she is working at a "very hard" level. Only walked two laps in 15 minutes. I feel that patient can work harder. Will cont. to monitor and progress.          Discharge Exercise Prescription (Final Exercise Prescription Changes):     Exercise Prescription Changes - 01/22/16 1217      Response to Exercise   Blood Pressure (Admit) 126/70   Blood Pressure (Exercise) 130/74   Blood Pressure (Exit) 100/62   Heart Rate (Admit) 78 bpm   Heart Rate (Exercise) 86 bpm   Heart Rate (Exit) 78 bpm   Oxygen Saturation (Admit) 93 %   Oxygen Saturation (Exercise) 91 %   Oxygen Saturation (Exit) 94 %   Rating of Perceived Exertion (Exercise) 17   Perceived Dyspnea (Exercise) 3   Duration Progress to 45 minutes of aerobic exercise without signs/symptoms of physical distress   Intensity THRR unchanged     Progression   Progression Continue to progress workloads to maintain intensity without signs/symptoms of physical distress.     Resistance Training   Training Prescription Yes   Weight green bands   Reps 10-12  10 minutes of strength training     Interval Training   Interval Training No     Oxygen   Oxygen Continuous   Liters 2-4     NuStep   Level 2   Minutes 17   METs 1.4     Track   Laps 3   Minutes 17       Nutrition:  Target Goals: Understanding of nutrition guidelines, daily intake of sodium <1550m, cholesterol <2029m calories 30% from fat and 7% or less from saturated fats, daily to have 5 or more servings of fruits and vegetables.  Biometrics:     Pre Biometrics - 11/16/15 1555      Pre Biometrics   Grip Strength 28 kg       Nutrition Therapy Plan and Nutrition Goals:   Nutrition  Discharge: Rate Your Plate Scores:     Nutrition Assessments - 01/30/16 1420      Rate Your Plate Scores   Pre Score 61      Psychosocial: Target Goals: Acknowledge presence or absence of depression, maximize coping skills, provide positive support system. Participant is able to verbalize types and ability to use techniques and skills needed for reducing stress and depression.  Initial Review & Psychosocial Screening:     Initial Psych Review & Screening - 11/16/15 1557      Initial Review   Current issues with History of Depression     Family Dynamics   Good Support System? Yes     Barriers   Psychosocial barriers to participate in program There are no identifiable barriers or psychosocial needs.     Screening Interventions   Interventions Encouraged to exercise      Quality of Life Scores:   PHQ-9: Recent Review Flowsheet Data    [facility] 01/22/2016 11/16/2015   Decreased Interest 1 0   Down, Depressed, Hopeless  1 0   PHQ - 2 Score 2 0      Psychosocial Evaluation and Intervention:     Psychosocial Evaluation - 11/16/15 1558      Psychosocial Evaluation & Interventions   Interventions Encouraged to exercise with the program and follow exercise prescription      Psychosocial Re-Evaluation:     Psychosocial Re-Evaluation    [place] Name 12/13/15 1008 12/31/15 1359           Psychosocial Re-Evaluation   Interventions Encouraged to attend Pulmonary Rehabilitation for the exercise Encouraged to attend Pulmonary Rehabilitation for the exercise      Comments No psychosocial issues identified at this time. No psychosocial issues identified at this time.      Continued Psychosocial Services Needed No No        Education: Education Goals: Education classes will be provided on a weekly basis, covering required topics. Participant will state understanding/return demonstration of topics presented.  Learning Barriers/Preferences:   Education  Topics: Risk Factor Reduction:  -Group instruction that is supported by a PowerPoint presentation. Instructor discusses the definition of a risk factor, different risk factors for pulmonary disease, and how the heart and lungs work together.     Nutrition for Pulmonary Patient:  -Group instruction provided by PowerPoint slides, verbal discussion, and written materials to support subject matter. The instructor gives an explanation and review of healthy diet recommendations, which includes a discussion on weight management, recommendations for fruit and vegetable consumption, as well as protein, fluid, caffeine, fiber, sodium, sugar, and alcohol. Tips for eating when patients are short of breath are discussed.   Pursed Lip Breathing:  -Group instruction that is supported by demonstration and informational handouts. Instructor discusses the benefits of pursed lip and diaphragmatic breathing and detailed demonstration on how to preform both.     Oxygen Safety:  -Group instruction provided by PowerPoint, verbal discussion, and written material to support subject matter. There is an overview of "What is Oxygen" and "Why do we need it".  Instructor also reviews how to create a safe environment for oxygen use, the importance of using oxygen as prescribed, and the risks of noncompliance. There is a brief discussion on traveling with oxygen and resources the patient may utilize.   Oxygen Equipment:  -Group instruction provided by [facility] Staff utilizing handouts, written materials, and equipment demonstrations. Flowsheet Row PULMONARY REHAB CHRONIC OBSTRUCTIVE PULMONARY DISEASE from 01/17/2016 in [place]  Date  01/17/16  Educator  [employer]  Instruction Review Code  2- meets goals/outcomes      Signs and Symptoms:  -Group instruction provided by written material and verbal discussion to support subject matter. Warning signs and symptoms of infection, stroke, and  heart attack are reviewed and when to call the physician/911 reinforced. Tips for preventing the spread of infection discussed. Flowsheet Row PULMONARY REHAB CHRONIC OBSTRUCTIVE PULMONARY DISEASE from 01/17/2016 in [place]  Date  12/20/15  Educator  rn  Instruction Review Code  2- meets goals/outcomes      Advanced Directives:  -Group instruction provided by verbal instruction and written material to support subject matter. Instructor reviews Advanced Directive laws and proper instruction for filling out document.   Pulmonary Video:  -Group video education that reviews the importance of medication and oxygen compliance, exercise, good nutrition, pulmonary hygiene, and pursed lip and diaphragmatic breathing for the pulmonary patient. Flowsheet Row PULMONARY REHAB CHRONIC OBSTRUCTIVE PULMONARY DISEASE from 01/17/2016 in [place]  [place]  Date  01/10/16  Educator  video  Instruction Review Code  R- Review/reinforce      Exercise for the Pulmonary Patient:  -Group instruction that is supported by a PowerPoint presentation. Instructor discusses benefits of exercise, core components of exercise, frequency, duration, and intensity of an exercise routine, importance of utilizing pulse oximetry during exercise, safety while exercising, and options of places to exercise outside of rehab.   Flowsheet Row PULMONARY REHAB CHRONIC OBSTRUCTIVE PULMONARY DISEASE from 01/17/2016 in [place]  Date  12/13/15  Educator  EP  Instruction Review Code  2- meets goals/outcomes      Pulmonary Medications:  -Verbally interactive group education provided by instructor with focus on inhaled medications and proper administration.   Anatomy and Physiology of the Respiratory System and Intimacy:  -Group instruction provided by PowerPoint, verbal discussion, and written material to support subject matter. Instructor reviews  respiratory cycle and anatomical components of the respiratory system and their functions. Instructor also reviews differences in obstructive and restrictive respiratory diseases with examples of each. Intimacy, Sex, and Sexuality differences are reviewed with a discussion on how relationships can change when diagnosed with pulmonary disease. Common sexual concerns are reviewed.   Knowledge Questionnaire Score:     Knowledge Questionnaire Score - 12/04/15 1701      Knowledge Questionnaire Score   Pre Score 10/13      Core Components/Risk Factors/Patient Goals at Admission:     Personal Goals and Risk Factors at Admission - 11/16/15 1555      Core Components/Risk Factors/Patient Goals on Admission    Weight Management Obesity;Yes   Intervention Obesity: Provide education and appropriate resources to help participant work on and attain dietary goals.   Expected Outcomes Understanding recommendations for meals to include 15-35% energy as protein, 25-35% energy from fat, 35-60% energy from carbohydrates, less than 236m of dietary cholesterol, 20-35 gm of total fiber daily;Understanding of distribution of calorie intake throughout the day with the consumption of 4-5 meals/snacks;Short Term: Continue to assess and modify interventions until short term weight is achieved   Sedentary Yes   Intervention Provide advice, education, support and counseling about physical activity/exercise needs.;Develop an individualized exercise prescription for aerobic and resistive training based on initial evaluation findings, risk stratification, comorbidities and participant's personal goals.   Expected Outcomes Achievement of increased cardiorespiratory fitness and enhanced flexibility, muscular endurance and strength shown through measurements of functional capacity and personal statement of participant.   Increase Strength and Stamina Yes   Intervention Provide advice, education, support and counseling about  physical activity/exercise needs.;Develop an individualized exercise prescription for aerobic and resistive training based on initial evaluation findings, risk stratification, comorbidities and participant's personal goals.   Expected Outcomes Achievement of increased cardiorespiratory fitness and enhanced flexibility, muscular endurance and strength shown through measurements of functional capacity and personal statement of participant.   Improve shortness of breath with ADL's Yes   Intervention Provide education, individualized exercise plan and daily activity instruction to help decrease symptoms of SOB with activities of daily living.   Expected Outcomes Short Term: Achieves a reduction of symptoms when performing activities of daily living.   Develop more efficient breathing techniques such as purse lipped breathing and diaphragmatic breathing; and practicing self-pacing with activity Yes   Intervention Provide education, demonstration and support about specific breathing techniuqes utilized for more efficient breathing. Include techniques such as pursed lipped breathing, diaphragmatic breathing and self-pacing activity.   Expected Outcomes Short Term:  Participant will be able to demonstrate and use breathing techniques as needed throughout daily activities.      Core Components/Risk Factors/Patient Goals Review:      Goals and Risk Factor Review    Row Name 12/13/15 1000 12/31/15 1357           Core Components/Risk Factors/Patient Goals Review   Personal Goals Review Weight Management/Obesity;Sedentary;Improve shortness of breath with ADL's;Increase knowledge of respiratory medications and ability to use respiratory devices properly.;Develop more efficient breathing techniques such as purse lipped breathing and diaphragmatic breathing and practicing self-pacing with activity.;Increase Strength and Stamina Weight Management/Obesity;Sedentary;Increase knowledge of respiratory medications and  ability to use respiratory devices properly.;Develop more efficient breathing techniques such as purse lipped breathing and diaphragmatic breathing and practicing self-pacing with activity.;Increase Strength and Stamina      Review Has only attended 2 exercises sessions, too early to see progress. Has been absent last week due to an illness, attendance has been poor, I question her diligence in the program.      Expected Outcomes Expect progress in the next 30 days. I expect progress with regular attendance         Core Components/Risk Factors/Patient Goals at Discharge (Final Review):      Goals and Risk Factor Review - 12/31/15 1357      Core Components/Risk Factors/Patient Goals Review   Personal Goals Review Weight Management/Obesity;Sedentary;Increase knowledge of respiratory medications and ability to use respiratory devices properly.;Develop more efficient breathing techniques such as purse lipped breathing and diaphragmatic breathing and practicing self-pacing with activity.;Increase Strength and Stamina   Review Has been absent last week due to an illness, attendance has been poor, I question her diligence in the program.   Expected Outcomes I expect progress with regular attendance      ITP Comments:   Comments: Patient has been discharged from pulmonary rehab per her request.  She attended 10 exercise sessions and did not feel this program fits her needs at this time.

## 2016-02-05 ENCOUNTER — Encounter (HOSPITAL_COMMUNITY): Payer: Medicare Other

## 2016-02-05 ENCOUNTER — Encounter: Payer: Medicare Other | Admitting: *Deleted

## 2016-02-05 DIAGNOSIS — E119 Type 2 diabetes mellitus without complications: Secondary | ICD-10-CM | POA: Diagnosis not present

## 2016-02-05 DIAGNOSIS — Z713 Dietary counseling and surveillance: Secondary | ICD-10-CM | POA: Diagnosis not present

## 2016-02-05 NOTE — Progress Notes (Signed)
Patient was seen on 02/05/2016 for the third of a series of three diabetes self-management courses at the [facility].   Catalina Gravel the amount of activity recommended for healthy living . Describe activities suitable for individual needs . Identify ways to regularly incorporate activity into daily life . Identify barriers to activity and ways to over come these barriers  Identify diabetes medications being personally used and their primary action for lowering glucose and possible side effects . Describe role of stress on blood glucose and develop strategies to address psychosocial issues . Identify diabetes complications and ways to prevent them  Explain how to manage diabetes during illness . Evaluate success in meeting personal goal . Establish 2-3 goals that they will plan to diligently work on until they return for the  76-month follow-up visit  Goals:   I will count my carb choices at most meals and snacks  I will be active 30 minutes or more 3 times a week  I will eat less unhealthy fats by eating less sweets  Your patient has identified these potential barriers to change:  Motivation  Your patient has identified their diabetes self-care support plan as  [employer] trader Plan:  Attend Support Group as desired

## 2016-02-05 NOTE — Progress Notes (Signed)

## 2016-02-07 ENCOUNTER — Encounter (HOSPITAL_COMMUNITY): Payer: Medicare Other

## 2016-02-12 ENCOUNTER — Encounter (HOSPITAL_COMMUNITY): Payer: Medicare Other

## 2016-02-12 NOTE — Progress Notes (Signed)
Cardiology Office Note    Date:  02/20/2016   ID:  Beth [name], DOB [birth year], MRN [id number]  PCP:  Beth Solo, MD  Cardiologist:   Beth Rouge, MD   Chief Complaint  Patient presents with  . Chronic obstructive pulmonary disease, unspecified COPD type    History of Present Illness:  Beth [name] is a 76 y.o. female referred for palpitations Seen by Dr Beth [name] 2009  After stroke. 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  Subsequent seizures. Has some word finding issues and cognitive deficits with emotional lability She takes Keppra and Klonopin and is unable to drive   Seen in ER 04/22/79  with progressive dyspnea for a month Daily with palpitations and bilateral leg swelling  No cough fever or chest pain. Quit smoking in 2003 after 41 packyear history   K 3.3 Ddimer .32  Just above normal CTA not done CXR NAD atelectasis and hyperinflation  BNP normal troponin normal   Echo reviewed 08/21/15  EF 65-70% grade one diastolic dysfunction No valve disease   Sees pulmonary for COPD Quit smoking in 2008 Stopped going to pulmonary rehab CTA no PE PFTls severe COPD mild improvement with bronchodilator DLCO 45%  On 2L [place] and icnreases to 4L with exercise   Complains of tightness in chest with dyspnea. Also heaviness in legs with activity   Past Medical History:  Diagnosis Date  . BRCA2 positive 2017  . Diverticulosis   . Hearing loss   . HTN (hypertension)   . Memory loss   . Seizure disorder, complex partial, 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[place]) seizure free for one year  . Seizures ([place])   . 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   . Vestibulitis of ear     Past Surgical History:  Procedure Laterality Date  . broken ankle Right   . CEREBRAL ANEURYSM REPAIR    . TONSILLECTOMY      Current Medications: Outpatient Medications Prior to Visit  Medication Sig Dispense Refill  . albuterol (PROVENTIL HFA;VENTOLIN HFA) 108 (90 Base)  MCG/ACT inhaler Inhale 2 puffs into the lungs every 4 (four) hours as needed for wheezing or shortness of breath. 1 Inhaler 5  . aspirin EC 81 MG tablet Take 81 mg by mouth daily.    Marland Kitchen atenolol (TENORMIN) 50 MG tablet TAKE 1/2 TABLET BY MOUTH TWICE A DAY    . captopril (CAPOTEN) 50 MG tablet Take 50 mg by mouth 2 (two) times daily.    . Cholecalciferol (VITAMIN D3) 2000 units capsule 1 tablet by mouth daily    . clonazePAM (KLONOPIN) 2 MG tablet Take 1 tablet (2 mg total) by mouth at bedtime. 90 tablet 1  . fexofenadine (ALLEGRA) 180 MG tablet Take 180 mg by mouth daily.     . fluticasone (FLONASE) 50 MCG/ACT nasal spray Place 2 sprays into the nose daily as needed for allergies. Spray twice daily as needed    . guaiFENesin (MUCINEX) 600 MG 12 hr tablet Take 1,200 mg by mouth 2 (two) times daily as needed for cough.     Marland Kitchen KEPPRA 750 MG tablet Take 1 tablet (750 mg total) by mouth 2 (two) times daily. 180 tablet 3  . LEXAPRO 20 MG tablet Take 1 tablet (20 mg total) by mouth daily. Morning dosage 90 tablet 3  . meloxicam (MOBIC) 15 MG tablet 1 tablet    . simvastatin (ZOCOR) 10 MG tablet Take 10 mg by mouth at bedtime.    Marland Kitchen  triamterene-hydrochlorothiazide (MAXZIDE-25) 37.5-25 MG tablet Take 1 tablet by mouth daily.    Marland Kitchen umeclidinium-vilanterol (ANORO ELLIPTA) 62.5-25 MCG/INH AEPB Inhale 1 puff daily    . doxycycline (VIBRA-TABS) 100 MG tablet Take 1 tablet (100 mg total) by mouth 2 (two) times daily. Take with food. 20 tablet 0   No facility-administered medications prior to visit.      Allergies:   Prozac [fluoxetine hcl]; Sulfa antibiotics; Levaquin [levofloxacin in d5w]; Micardis [telmisartan]; Prednisone; Lisinopril-hydrochlorothiazide; Augmentin [amoxicillin-pot clavulanate]; Avelox [moxifloxacin hcl in nacl]; and Norvasc [amlodipine besylate]   Social History   Social History  . Marital status: Married    Spouse name: Beth [name]  . Number of children: 2  . Years of education: 65   Social  History Main Topics  . Smoking status: Former Smoker    Packs/day: 1.00    Years: 41.00    Quit date: 01/13/2006  . Smokeless tobacco: Never Used  . Alcohol use No  . Drug use: No  . Sexual activity: No   Other Topics Concern  . None   Social History Narrative   Patient is married (Beth [name]) and lives at home with her husband.   Patient is retired.   Patient has two children.   Patient has a high school education.   Patient is right-handed.   Patient drinks 2 big mugs of coffee daily and sometimes tea in the evenings.     Family History:  The patient's family history includes Bleeding Disorder in her maternal grandfather; Breast cancer (age of onset: 110) in her daughter; Colon cancer in her maternal aunt; Dementia in her maternal grandfather and mother; Emphysema in her maternal aunt, maternal aunt, and maternal uncle; High blood pressure in her maternal grandmother; Osteoporosis in her mother; Other in her daughter and daughter; Ovarian cancer (age of onset: 15) in her paternal grandmother; Ovarian cancer (age of onset: 50) in her paternal aunt; Stroke in her mother; Thyroid disease in her mother.   ROS:   Please see the history of present illness.    ROS All other systems reviewed and are negative.   PHYSICAL EXAM:   VS:  BP 122/70   Pulse 78   Ht _0  (1.626 m)   Wt 249 lb 1.9 oz (113 kg)   LMP 01/14/1995 (Approximate)   SpO2 92%   BMI 42.76 kg/m    Obese white female  HEENT: normal  [place] oxygen on  Neck: no JVD, carotid bruits, or masses Cardiac: RRR; no murmurs, rubs, or gallops,no edema  Respiratory:  clear to auscultation bilaterally, normal work of breathing GI: soft, nontender, nondistended, + BS MS: no deformity or atrophy  Skin: warm and dry, no rash Neuro:  Alert and Oriented x 3, Strength and sensation are intact Psych: euthymic mood, full affect Plus one bilateral edema  Wt Readings from Last 3 Encounters:  02/20/16 249 lb 1.9 oz (113 kg)  01/22/16 250 lb 8  oz (113.6 kg)  01/17/16 253 lb 15.5 oz (115.2 kg)      Studies/Labs Reviewed:   EKG:   08/01/15  SR rate 78 LAD poor R wave progression   Recent Labs: 07/30/2015: B Natriuretic Peptide 24.5; BUN 11; Creatinine, Ser 0.80; Hemoglobin 14.7; Platelets 247; Potassium 3.3; Sodium 134   Lipid Panel    Component Value Date/Time   CHOL  04/16/2007 0545    146        ATP III CLASSIFICATION:  <200     mg/dL   Desirable  200-239  mg/dL   Borderline High  >=240    mg/dL   High   TRIG 75 04/16/2007 0545   HDL 47 04/16/2007 0545   CHOLHDL 3.1 04/16/2007 0545   VLDL 15 04/16/2007 0545   LDLCALC  04/16/2007 0545    84        Total Cholesterol/HDL:CHD Risk Coronary Heart Disease Risk Table                     Men   Women  1/2 Average Risk   3.4   3.3    Additional studies/ records that were reviewed today include:  [facility] primary notes Dr Sabra Heck     ASSESSMENT:    1. Chronic obstructive pulmonary disease, unspecified COPD type ([place])   2. Chest pain, unspecified type      PLAN:  In order of problems listed above:  1. Dyspnea: does not appear to be cardiac related with no CHF on CXR and normal BNP Normal Echo Severe COPD followed by pulmonary continue oxygen and inhalers  R/O anginal equivalent given tightness in chest f/u myovue  2. Seizures:  F/u Dohmeier took Klonopin today for aura continue Keppra 3. HTN:  Well controlled.  Continue current medications and low sodium Dash type diet.   4. Edema:  Continue HCTZ seems to be dependant with varicose veins and obesity  Doubt DVT     Medication Adjustments/Labs and Tests Ordered: Current medicines are reviewed at length with the patient today.  Concerns regarding medicines are outlined above.  Medication changes, Labs and Tests ordered today are listed in the Patient Instructions below. Patient Instructions  Medication Instructions:  Your physician recommends that you continue on your current medications as directed.  Please refer to the Current Medication list given to you today.  Labwork: NONE  Testing/Procedures: Your physician has requested that you have a lexiscan myoview. For further information please visit [email or web address]. Please follow instruction sheet, as given.  Follow-Up: Your physician wants you to follow-up as needed with  Dr. Johnsie Cancel.   If you need a refill on your cardiac medications before your next appointment, please call your pharmacy.       Signed, Beth Rouge, MD  02/20/2016 4:52 PM    [facility] [address], [place], [place]  [address] Phone: [phone]; Fax: [phone]

## 2016-02-14 ENCOUNTER — Encounter (HOSPITAL_COMMUNITY): Payer: Medicare Other

## 2016-02-19 ENCOUNTER — Encounter (HOSPITAL_COMMUNITY): Payer: Medicare Other

## 2016-02-20 ENCOUNTER — Ambulatory Visit (INDEPENDENT_AMBULATORY_CARE_PROVIDER_SITE_OTHER): Payer: Medicare Other | Admitting: Cardiovascular Disease

## 2016-02-20 ENCOUNTER — Encounter: Payer: Self-pay | Admitting: Cardiovascular Disease

## 2016-02-20 VITALS — BP 122/70 | HR 78 | Ht 64.0 in | Wt 249.1 lb

## 2016-02-20 DIAGNOSIS — R079 Chest pain, unspecified: Secondary | ICD-10-CM | POA: Diagnosis not present

## 2016-02-20 DIAGNOSIS — J449 Chronic obstructive pulmonary disease, unspecified: Secondary | ICD-10-CM | POA: Diagnosis not present

## 2016-02-20 NOTE — Patient Instructions (Addendum)
Medication Instructions:  Your physician recommends that you continue on your current medications as directed. Please refer to the Current Medication list given to you today.  Labwork: NONE  Testing/Procedures: Your physician has requested that you have a lexiscan myoview. For further information please visit www.[email or web address]. Please follow instruction sheet, as given.  Follow-Up: Your physician wants you to follow-up as needed with Dr. Nishan.    If you need a refill on your cardiac medications before your next appointment, please call your pharmacy.    

## 2016-02-21 ENCOUNTER — Encounter (HOSPITAL_COMMUNITY): Payer: Medicare Other

## 2016-02-26 ENCOUNTER — Encounter (HOSPITAL_COMMUNITY): Payer: Medicare Other

## 2016-02-27 ENCOUNTER — Ambulatory Visit (HOSPITAL_COMMUNITY): Payer: Medicare Other | Attending: Cardiology

## 2016-02-27 DIAGNOSIS — R079 Chest pain, unspecified: Secondary | ICD-10-CM | POA: Diagnosis not present

## 2016-02-27 DIAGNOSIS — J449 Chronic obstructive pulmonary disease, unspecified: Secondary | ICD-10-CM | POA: Insufficient documentation

## 2016-02-27 MED ORDER — TECHNETIUM TC 99M TETROFOSMIN IV KIT
32.4000 | PACK | Freq: Once | INTRAVENOUS | Status: AC | PRN
  Administered 2016-02-27: 32.4 via INTRAVENOUS
  Filled 2016-02-27: qty 33

## 2016-02-27 MED ORDER — REGADENOSON 0.4 MG/5ML IV SOLN
0.4000 mg | Freq: Once | INTRAVENOUS | Status: AC
  Administered 2016-02-27: 0.4 mg via INTRAVENOUS

## 2016-02-28 ENCOUNTER — Ambulatory Visit (HOSPITAL_COMMUNITY): Payer: Medicare Other | Attending: Cardiovascular Disease

## 2016-02-28 ENCOUNTER — Encounter (HOSPITAL_COMMUNITY): Payer: Medicare Other

## 2016-02-28 LAB — MYOCARDIAL PERFUSION IMAGING
LHR: 0.29
LV sys vol: 20 mL
LVDIAVOL: 88 mL (ref 46–106)
NUC STRESS TID: 1.05
Peak HR: 73 {beats}/min
Rest HR: 62 {beats}/min
SDS: 4
SRS: 0
SSS: 4

## 2016-02-28 MED ORDER — TECHNETIUM TC 99M TETROFOSMIN IV KIT
32.3000 | PACK | Freq: Once | INTRAVENOUS | Status: AC | PRN
  Administered 2016-02-28: 32.3 via INTRAVENOUS
  Filled 2016-02-28: qty 33

## 2016-02-29 ENCOUNTER — Telehealth: Payer: Self-pay | Admitting: Cardiovascular Disease

## 2016-02-29 NOTE — Telephone Encounter (Signed)
Discussed myoview results from 02/28/16 with pt.

## 2016-02-29 NOTE — Telephone Encounter (Signed)
Pt called returning your call 

## 2016-03-04 ENCOUNTER — Encounter (HOSPITAL_COMMUNITY): Payer: Medicare Other

## 2016-03-06 ENCOUNTER — Encounter (HOSPITAL_COMMUNITY): Payer: Medicare Other

## 2016-03-07 ENCOUNTER — Encounter: Payer: Self-pay | Admitting: Pulmonary Disease

## 2016-03-07 ENCOUNTER — Ambulatory Visit (INDEPENDENT_AMBULATORY_CARE_PROVIDER_SITE_OTHER): Payer: Medicare Other | Admitting: Pulmonary Disease

## 2016-03-07 VITALS — BP 122/62 | HR 90 | Ht 64.0 in | Wt 249.0 lb

## 2016-03-07 DIAGNOSIS — J449 Chronic obstructive pulmonary disease, unspecified: Secondary | ICD-10-CM | POA: Diagnosis not present

## 2016-03-07 MED ORDER — FLUTICASONE-UMECLIDIN-VILANT 100-62.5-25 MCG/INH IN AEPB: 1.0000 | INHALATION_SPRAY | Freq: Every day | RESPIRATORY_TRACT | 0 refills | Status: DC

## 2016-03-07 NOTE — Progress Notes (Signed)
   Subjective:    Patient ID: Beth [name], female    DOB: 1940/10/23, 76 y.o.   MRN: [id number]  HPI  76 year-old ex-smoker, quit in 2009, For follow-up of COPD   03/07/2016  Chief Complaint  Patient presents with  . Follow-up    COPD, she states she is doing fair, on 2L today, still SOB with exertion, she states at time her breathing gets out of rhythm and she is having a little cough   Started on O2 11/2015  She attended pulmonary rehabilitation for about 10 sessions and then was discharged since apparently she had problems with motivation and was late for many classes She desaturated to 78% when walking a few yards without oxygen today Otherwise she states that she is compliant and this has helped her   Medication review shows captopril- she reports a tickle in her throat but is able to tolerate and she is hesitant to stop this  She has vague symptoms of increasing dyspnea and some sadness of mood and anxiety  Significant tests/ events  CT angio chest 07/2015 >>no pulmonary embolism, no emphysema  2010 head CT >> polyps or mucous retention cysts  seen within the maxillary sinuses bilaterally  PFTs 09/2015>>severe airway obstruction with FEV1 40%, improves to 47% with albuterol,DLCO decreased to 45%   Review of Systems neg for any significant sore throat, dysphagia, itching, sneezing, nasal congestion or excess/ purulent secretions, fever, chills, sweats, unintended wt loss, pleuritic or exertional cp, hempoptysis, orthopnea pnd or change in chronic leg swelling. Also denies presyncope, palpitations, heartburn, abdominal pain, nausea, vomiting, diarrhea or change in bowel or urinary habits, dysuria,hematuria, rash, arthralgias, visual complaints, headache, numbness weakness or ataxia.     Objective:   Physical Exam   Gen. Pleasant,obese in no distress ENT - no lesions, no post nasal drip Neck: No JVD, no thyromegaly, no carotid bruits Lungs: no use of  accessory muscles, no dullness to percussion, clear without rales or rhonchi  Cardiovascular: Rhythm regular, heart sounds  normal, no murmurs or gallops, no peripheral edema Musculoskeletal: No deformities, no cyanosis or clubbing         Assessment & Plan:

## 2016-03-07 NOTE — Assessment & Plan Note (Signed)
Trial of Trelegy instead of ANORO Call us for prescription if this works

## 2016-03-07 NOTE — Patient Instructions (Signed)
Trial of Trelegy instead of ANORO Call us for prescription if this works   keep oxygen level at about 88%

## 2016-03-07 NOTE — Assessment & Plan Note (Signed)
keep oxygen level at about 88%  No evidence of pulmonary vascular disease

## 2016-03-11 ENCOUNTER — Encounter (HOSPITAL_COMMUNITY): Payer: Self-pay

## 2016-03-11 ENCOUNTER — Encounter (HOSPITAL_COMMUNITY): Payer: Medicare Other

## 2016-03-13 ENCOUNTER — Encounter (HOSPITAL_COMMUNITY): Payer: Medicare Other

## 2016-03-18 ENCOUNTER — Encounter (HOSPITAL_COMMUNITY): Payer: Medicare Other

## 2016-03-20 ENCOUNTER — Encounter (HOSPITAL_COMMUNITY): Payer: Medicare Other

## 2016-03-20 ENCOUNTER — Telehealth: Payer: Self-pay | Admitting: Pulmonary Disease

## 2016-03-20 MED ORDER — FLUTICASONE-UMECLIDIN-VILANT 100-62.5-25 MCG/INH IN AEPB: 1.0000 | INHALATION_SPRAY | Freq: Every day | RESPIRATORY_TRACT | 0 refills | Status: DC

## 2016-03-20 NOTE — Telephone Encounter (Signed)
Spoke with she feels like she has not had enough time with the trelegy and is requesting another sample. Her sample was placed up front Nothing further is needed at this time.

## 2016-03-27 ENCOUNTER — Ambulatory Visit (INDEPENDENT_AMBULATORY_CARE_PROVIDER_SITE_OTHER): Payer: Medicare Other | Admitting: Obstetrics and Gynecology

## 2016-03-27 ENCOUNTER — Encounter: Payer: Self-pay | Admitting: Obstetrics and Gynecology

## 2016-03-27 ENCOUNTER — Ambulatory Visit (INDEPENDENT_AMBULATORY_CARE_PROVIDER_SITE_OTHER): Payer: Medicare Other

## 2016-03-27 VITALS — BP 130/78 | HR 66 | Ht 63.5 in | Wt 251.0 lb

## 2016-03-27 DIAGNOSIS — N859 Noninflammatory disorder of uterus, unspecified: Secondary | ICD-10-CM

## 2016-03-27 DIAGNOSIS — Z1509 Genetic susceptibility to other malignant neoplasm: Principal | ICD-10-CM

## 2016-03-27 DIAGNOSIS — Z1502 Genetic susceptibility to malignant neoplasm of ovary: Secondary | ICD-10-CM

## 2016-03-27 DIAGNOSIS — Z1501 Genetic susceptibility to malignant neoplasm of breast: Secondary | ICD-10-CM

## 2016-03-27 DIAGNOSIS — N858 Other specified noninflammatory disorders of uterus: Secondary | ICD-10-CM | POA: Diagnosis not present

## 2016-03-27 NOTE — Patient Instructions (Addendum)

## 2016-03-27 NOTE — Progress Notes (Signed)
Encounter reviewed by Dr. Tausha Milhoan Amundson C. Silva.  

## 2016-03-27 NOTE — Progress Notes (Signed)
Patient ID: Beth [name], female   DOB: May 20, 1940, 76 y.o.   MRN: [id number] GYNECOLOGY  VISIT   HPI: 76 y.o.   Married  Caucasian  female   G2P2002 with Patient's last menstrual period was 01/14/1995 (approximate).   here for pelvic ultrasound for Positive BRCA2--follow up ultrasound.    Does have pelvic type cramping.  This is not new.  No postmenopausal bleeding.  Pelvic ultrasound on 09/06/15: Uterus no masses.  Sliver of free fluid?   EMS 4.04 mm.  Calcifications 2 mm seen at endometrial interface.  Ovaries normal. No free fluid.  Has a rectocele.  Pessary was not successful. It did not stay in and was not comfortable.  Now wearing oxygen 24 hours per day. Tried pulmonary rehab classes and this did not work well for patient.   Thinks she has seasonal affective disorder. Taking Lexapro.   Hx stroke.  GYNECOLOGIC HISTORY: Patient's last menstrual period was 01/14/1995 (approximate). Contraception:  Postmenopausal Menopausal hormone therapy:  none Last mammogram:  05-30-15 3D Density A/Neg/BiRads1:[facility] Last pap smear:   05-31-15 Neg        OB History    Gravida Para Term Preterm AB Living   _0 SAB TAB Ectopic Multiple Live Births           2         Patient Active Problem List   Diagnosis Date Noted  . Chronic respiratory failure ([place]) 12/11/2015  . Acute sinusitis 11/07/2015  . COPD without exacerbation ([place]) 10/08/2015  . Genetic testing 08/26/2015  . 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  . Family history of ovarian cancer 07/26/2015  . 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 Benign paroxysmal positional vertigo 04/06/2014  . Tonic-clonic seizure syndrome ([place]) 04/06/2014  . Pulsatile tinnitus of right ear 04/06/2014  . Other forms of epilepsy and recurrent seizures without mention of intractable epilepsy 04/21/2013  . SAH (subarachnoid hemorrhage) ([place]) 04/21/2013  . 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 Seizure disorder, complex partial, with  intractable epilepsy ([place])   . Stroke, hemorrhagic ([place])   . HTN (hypertension)   . Vestibulitis of ear     Past Medical History:  Diagnosis Date  . BRCA2 positive 2017  . Diverticulosis   . Hearing loss   . HTN (hypertension)   . Memory loss   . Seizure disorder, complex partial, 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[place]) seizure free for one year  . Seizures ([place])   . 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   . Vestibulitis of ear     Past Surgical History:  Procedure Laterality Date  . broken ankle Right   . CEREBRAL ANEURYSM REPAIR    . TONSILLECTOMY      Current Outpatient Prescriptions  Medication Sig Dispense Refill  . albuterol (PROVENTIL HFA;VENTOLIN HFA) 108 (90 Base) MCG/ACT inhaler Inhale 2 puffs into the lungs every 4 (four) hours as needed for wheezing or shortness of breath. 1 Inhaler 5  . aspirin EC 81 MG tablet Take 81 mg by mouth daily.    Marland Kitchen atenolol (TENORMIN) 50 MG tablet TAKE 1/2 TABLET BY MOUTH TWICE A DAY    . captopril (CAPOTEN) 50 MG tablet Take 50 mg by mouth 2 (two) times daily.    . Cholecalciferol (VITAMIN D3) 2000 units capsule 1 tablet by mouth daily    . clonazePAM (KLONOPIN) 2 MG tablet Take 1 tablet (2 mg total) by mouth at bedtime. 90 tablet 1  . fexofenadine (ALLEGRA)  180 MG tablet Take 180 mg by mouth daily.     . fluticasone (FLONASE) 50 MCG/ACT nasal spray Place 2 sprays into the nose daily as needed for allergies. Spray twice daily as needed    . Fluticasone-Umeclidin-Vilant (TRELEGY ELLIPTA) 100-62.5-25 MCG/INH AEPB Inhale 1 puff into the lungs daily. 1 each 0  . guaiFENesin (MUCINEX) 600 MG 12 hr tablet Take 1,200 mg by mouth 2 (two) times daily as needed for cough.     Marland Kitchen KEPPRA 750 MG tablet Take 1 tablet (750 mg total) by mouth 2 (two) times daily. 180 tablet 3  . LEXAPRO 20 MG tablet Take 1 tablet (20 mg total) by mouth daily. Morning dosage 90 tablet 3  . meloxicam (MOBIC) 15 MG tablet 1 tablet    . simvastatin (ZOCOR) 10 MG tablet Take 10  mg by mouth at bedtime.    . triamterene-hydrochlorothiazide (MAXZIDE-25) 37.5-25 MG tablet Take 1 tablet by mouth daily.    Marland Kitchen umeclidinium-vilanterol (ANORO ELLIPTA) 62.5-25 MCG/INH AEPB Inhale 1 puff daily     No current facility-administered medications for this visit.      ALLERGIES: Prozac [fluoxetine hcl]; Sulfa antibiotics; Levaquin [levofloxacin in d5w]; Micardis [telmisartan]; Prednisone; Lisinopril-hydrochlorothiazide; Augmentin [amoxicillin-pot clavulanate]; Avelox [moxifloxacin hcl in nacl]; and Norvasc [amlodipine besylate]  Family History  Problem Relation Age of Onset  . Dementia Mother   . Stroke Mother   . Thyroid disease Mother   . Osteoporosis Mother   . Ovarian cancer Paternal Grandmother 17  . Ovarian cancer Paternal Aunt 45  . Breast cancer Daughter 17    breast ca x2, bilateral dx. 38, 46--estrogen  . Other Daughter     no genetic testing as of 07/2015; has had ovaries removed  . Other Daughter     BRCA 2 Pos.  . Colon cancer Maternal Aunt     dx. unspecified age  . Emphysema Maternal Uncle   . High blood pressure Maternal Grandmother   . Dementia Maternal Grandfather   . Bleeding Disorder Maternal Grandfather   . Emphysema Maternal Aunt   . Emphysema Maternal Aunt     Social History   Social History  . Marital status: Married    Spouse name: Jenny Reichmann  . Number of children: 2  . Years of education: 12   Occupational History  . Not on file.   Social History Main Topics  . Smoking status: Former Smoker    Packs/day: 1.00    Years: 41.00    Quit date: 01/13/2006  . Smokeless tobacco: Never Used  . Alcohol use No  . Drug use: No  . Sexual activity: No   Other Topics Concern  . Not on file   Social History Narrative   Patient is married ([name]) and lives at home with her husband.   Patient is retired.   Patient has two children.   Patient has a high school education.   Patient is right-handed.   Patient drinks 2 big mugs of coffee daily and  sometimes tea in the evenings.    ROS:  Pertinent items are noted in HPI.  PHYSICAL EXAMINATION:    BP 130/78 (BP Location: Right Arm, Patient Position: Sitting, Cuff Size: Normal)   Pulse 66   Ht 5' 3.5" (1.613 m)   Wt 251 lb (113.9 kg)   LMP 01/14/1995 (Approximate)   BMI 43.77 kg/m     General appearance: alert, cooperative and appears stated age   Pelvic ultrasound: Uterus with scattered myometrial calcifications.  EMS 3.17.  Sliver of fluid in endometrial canal. Left ovary seen with difficulty due to overlying gas.  Right ovary not seen due to bowel. No free fluid.                Endometrial biopsy Consent for procedure.  Speculum placed.  Sterile prep with Hibiclens. Paracervical block with 10 cc lidocaine 1% - lot [id number], exp 02/21. Tenaculum to anterior cervical lip. Pipelle to 7 cm twice.  Tissue to pathology.  Instruments removed.  Minimal EBL.  No complications.   Chaperone was present for exam.  ASSESSMENT  BRCA 2 positive.   Pelivc ultrasound with sliver of fluid in endometrial canal and EMS over 3 mm. Genuine stress incontinence.  Rectocele.  COPD.  On O2. Hx hemorrhagic stroke. HTN.  PLAN  EMB to [facility]. Post EMB instructions to patient.  I would like for the patient to see GYN oncology for consultation regarding her BRCA2 positive status.  She has comorbidities which place her at high risk to undergo lap BSO or mastectomy.  She may benefit from doing twice yearly pelvic ultrasounds and going to the high risk breast cancer screening clinic as an alternative. I will make these referrals to GYN ONC and to the [place] screening clinic.  An After Visit Summary was printed and given to the patient.  __45____ minutes face to face time of which over 50% was spent in counseling.

## 2016-03-31 ENCOUNTER — Telehealth: Payer: Self-pay | Admitting: *Deleted

## 2016-03-31 DIAGNOSIS — Z1501 Genetic susceptibility to malignant neoplasm of breast: Secondary | ICD-10-CM

## 2016-03-31 DIAGNOSIS — Z9189 Other specified personal risk factors, not elsewhere classified: Secondary | ICD-10-CM

## 2016-03-31 DIAGNOSIS — Z1509 Genetic susceptibility to other malignant neoplasm: Principal | ICD-10-CM

## 2016-03-31 LAB — CA 125: CA 125: 12 U/mL (ref <35)

## 2016-03-31 NOTE — Telephone Encounter (Signed)
Order placed for [facility] Dr. Jana Hakim.

## 2016-03-31 NOTE — Telephone Encounter (Signed)
Left message to call Sharee Pimple at [phone].  Orders placed for referral to GYN Oncology Dr. Denman George and [facility] for [place] dated 03/11/16 resulted positive BRCA 2  Cc: Theresia Lo

## 2016-03-31 NOTE — Telephone Encounter (Signed)
-----  Message from Nunzio Cobbs, MD sent at 03/30/2016 10:44 AM EDT ----- Regarding: Please help me make referral to [place] and high risk breast cancer screening clinic Hi Prisha Hiley,  Will you please help me make a referral to GYN ONC and also to the high risk breast cancer screening clinic due to the patient's positive BRCA2 status?  Thank you!  [place]

## 2016-03-31 NOTE — Telephone Encounter (Signed)
Spoke with patient, advised patient referrals placed for Dr. Denman George, GYN [place] at [facility] and [place] at [facility]. Patient states only days not available for scheduling are 3/27, 3/28, 4/11; patient prefers afternoon. Advised patient our referral department will f/u with updates on scheduling. Patient verbalizes understanding and is agreeable.

## 2016-03-31 NOTE — Telephone Encounter (Signed)
I would like to put in the referral through [facility] to have a record of this.  Diagnosis is BRCA2 and at increased risk of breast cancer.  Ok to also fax this over as well.  

## 2016-03-31 NOTE — Telephone Encounter (Signed)
We can keep the referral through [facility] for tracking purposes. I will close referral to genetic testing and can change the referral to Gynecology - Oncology to Dr. Virgie Dad office if agreeable.  Routing to Dr. Quincy Simmonds for review Cc: Elwin Sleight

## 2016-03-31 NOTE — Telephone Encounter (Signed)
Call to [facility] at [phone], spoke with [facility] about referral to high risk breast cancer screening clinic. Was advised patient would be scheduled with one of the physicians who specialize in breast cancer such as Dr. Jana Hakim, for evaluation. Was advised to fax referral to Vicente Serene at [phone], no referral through [facility]. Seth Bake to contact patient to schedule with provider.    Order for referral to Genetics discontinued.

## 2016-03-31 NOTE — Telephone Encounter (Signed)
My appreciation.

## 2016-03-31 NOTE — Telephone Encounter (Signed)
Patient has already had genetic testing.  Is this how she will get into the high risk breast cancer screening clinic?

## 2016-04-01 NOTE — Telephone Encounter (Addendum)
Left message to call Sharee Pimple at [phone].   ----- Message from Nunzio Cobbs, MD sent at 03/31/2016  4:18 PM EDT ----- Please report normal CA125 blood test result to patient.  Endometrial biopsy result is pending.

## 2016-04-01 NOTE — Telephone Encounter (Signed)
Spoke with patient, advised of results as seen below per Dr. Quincy Simmonds. Updated patient on referrals placed. Advised patient to expect f/u from [facility] for scheduling 2 appointments- one for Dr. Denman George and one for Dr. Jana Hakim. Patient verbalizes understanding and is agreeable.  Routing to provider for final review. Patient is agreeable to disposition. Will close encounter.  Cc: Theresia Lo

## 2016-04-08 ENCOUNTER — Ambulatory Visit (INDEPENDENT_AMBULATORY_CARE_PROVIDER_SITE_OTHER): Payer: Medicare Other | Admitting: Neurology

## 2016-04-08 ENCOUNTER — Encounter: Payer: Self-pay | Admitting: Neurology

## 2016-04-08 DIAGNOSIS — F09 Unspecified mental disorder due to known physiological condition: Secondary | ICD-10-CM | POA: Diagnosis not present

## 2016-04-08 DIAGNOSIS — G40409 Other generalized epilepsy and epileptic syndromes, not intractable, without status epilepticus: Secondary | ICD-10-CM

## 2016-04-08 DIAGNOSIS — I609 Nontraumatic subarachnoid hemorrhage, unspecified: Secondary | ICD-10-CM

## 2016-04-08 DIAGNOSIS — R569 Unspecified convulsions: Secondary | ICD-10-CM | POA: Diagnosis not present

## 2016-04-08 MED ORDER — CLONAZEPAM 2 MG PO TABS: 2.0000 mg | ORAL_TABLET | Freq: Every day | ORAL | 1 refills | Status: DC

## 2016-04-08 MED ORDER — KEPPRA 750 MG PO TABS
750.0000 mg | ORAL_TABLET | Freq: Two times a day (BID) | ORAL | 3 refills | Status: DC
Start: 2016-04-08 — End: 2017-04-13

## 2016-04-08 NOTE — Patient Instructions (Signed)
Subarachnoid Hemorrhage Subarachnoid hemorrhage is bleeding in the area between the brain and the membrane that covers the brain (subarachnoid space). This increases the pressure on the brain and causes some areas of the brain to be deprived of blood flow. Subarachnoid hemorrhage is a medical emergency that may cause permanent brain damage, stroke, or even death if not treated. What are the causes?  Head injury.  Ruptured brain aneurysm.  Bleeding from blood vessels that develop abnormally (arteriovenous malformation).  Bleeding disorder.  Use of blood thinners (anticoagulants).  Use of certain drugs, such as cocaine. For some people with subarachnoid hemorrhage, the cause is unknown. What increases the risk?  Smoking.  Having high blood pressure (hypertension).  Abusing alcohol.  Being a female, especially being of post-menopausal age.  Having a family history of disease in the blood vessels of the brain (cerebrovascular disease).  Having certain genetic syndromes that result in kidney disease or connective tissue disease. What are the signs or symptoms?  A sudden, severe headache with no known cause. The headache is often described as the worst headache ever experienced.  Nausea or vomiting, especially when combined with other symptoms such as a headache.  Sudden weakness or numbness of the face, arm, or leg, especially on one side of the body.  Sudden trouble walking or difficulty moving arms or legs.  Sudden confusion.  Sudden personality changes.  Trouble speaking (aphasia) or understanding.  Difficulty swallowing.  Sudden trouble seeing in one or both eyes.  Double vision.  Dizziness.  Loss of balance or coordination.  Intolerance to light.  Stiff neck. How is this diagnosed? Your health care provider will perform a physical exam and ask about your symptoms. If a subarachnoid hemorrhage is suspected, various tests may be ordered. These tests may  include:  A CT scan.  An MRI.  A cerebral angiogram.  A spinal tap (lumbar puncture).  Blood tests. How is this treated? Immediate treatment in the hospital is often required to reduce the risk of brain damage. Treatment will depend on the cause of the bleeding, where it is located, and the extent of the bleeding and damage. The goals of treatment include stopping the bleeding, repairing the cause of bleeding, providing relief of symptoms, and preventing problems.  Medicines may be given to:  Lower blood pressure (antihypertensives).  Relieve pain (analgesics).  Relieve nausea or vomiting.  Surgery may also be needed to stop the bleeding, repair the cause of the bleeding, or remove the blood.  Rehabilitation may be needed to improve any cognitive and day-to-day functions impaired by the condition. Further treatment depends on the duration, severity, and cause of your symptoms. Physical, speech, and occupational therapists will assess you and work to improve any functions impaired by the subarachnoid hemorrhage. Measures will be taken to prevent short-term and long-term problems, including infection from breathing foreign material into the lungs (aspiration pneumonia), blood clots in the legs, bedsores, and falls. Follow these instructions at home: After your hospitalization or inpatient rehabilitation is completed and you are well enough to go home, it is important to prevent a reoccurrence. Take these steps to help prevent this:  Take medicines only as directed by your health care provider.  If swallow studies have determined that your swallowing reflex is present, you should eat healthy foods. A diet low in salt (sodium), saturated fat, trans fat, and cholesterol may be recommended to manage high blood pressure. Foods may need to be a special consistency (soft or pureed), or small bites  may need to be taken in order to avoid aspirating or choking.  Rest and limit activities or  movements as directed by your health care provider.  Do not use any tobacco products including cigarettes, chewing tobacco, or electronic cigarettes. If you need help quitting, ask your health care provider.  Limit alcohol intake to no more than 1 drink per day for nonpregnant women and 2 drinks per day for men. One drink equals 12 ounces of beer, 5 ounces of wine, or 1 ounces of hard liquor.  Make any other lifestyle changes as directed by your health care provider.  Monitor and record your blood pressure as directed by your health care provider.  A safe home environment is important to reduce the risk of falls. Your health care provider may arrange for specialists to evaluate your home. Having grab bars in the bedroom and bathroom is often important. Your health care provider may arrange for special equipment to be used at home, such as raised toilets and a seat for the shower.  Physical, occupational, and speech therapy. Ongoing therapy may be needed to maximize your recovery after a subarachnoid bleed. If you have been advised to use a walker or a cane, use it at all times. Be sure to keep your therapy appointments.  Keep all follow-up visits with your health care provider and other specialists. This includes any referrals, physical therapy, and rehabilitation. Get help right away if:  You suddenly have a sudden, severe headache with no known cause.  You have nausea or vomiting occurring with another symptom.  You have sudden weakness or numbness of the face, arm, or leg, especially on one side of the body.  You have sudden trouble walking or difficulty moving arms or legs.  You have sudden confusion.  You have trouble speaking (aphasia) or understanding.  You have sudden trouble seeing in one or both eyes.  You have a sudden loss of balance or coordination.  You have a stiff neck.  You have difficulty breathing.  You have a partial or total loss of consciousness. Any of  these symptoms may represent a serious problem that is an emergency. Do not wait to see if the symptoms will go away. Get medical help right away. Call your local emergency services (911 in [place] Do not drive yourself to the hospital. This information is not intended to replace advice given to you by your health care provider. Make sure you discuss any questions you have with your health care provider. Document Released: 11/17/2003 Document Revised: 06/07/2015 Document Reviewed: 02/13/2012 Elsevier Interactive Patient Education  2017 [employer].

## 2016-04-08 NOTE — Progress Notes (Addendum)
[facility]  Provider:  Dr Xiomar Crompton Referring Provider: Kathyrn Lass, MD Primary Care Physician:  Tawanna Solo, MD  Patient with [facility] after an aneurysm bleed, seizures related to [facility].   HPI:  Beth [name] is a 76 y.o. female here seen originally many years ago , PCP is  Dr.Kevin Little.  Beth [name] is seen here today for revisit on 04/08/2016 and was diagnosed with COPD in autumn 2017 and has been on oxygen since. She has gained a rosea color, and she is not as short of breath. 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 Her very last seizure took place in November 2014 in circumstances that were very stressful to her. We have tested today her memory and a Montral cognitive assessment and the patient scored 25 out of 30 points, with  5/5 delayed recall words ! Serial sevens were little difficult for her and word generation because of her aphasia. She had trouble with visual spatial part as well,  was able to draw Clock face but not a three-dimensional cube or perform a trail making  test. 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    the patient further endorsed in her review of systems today cold and heat intolerance, light sensitivity and eye itching, runny nose, rectal pain, she has developed a rectocele, diverticulitis  joint pain or joint swelling were endorsed. But no new psychiatric or neurological problems . Beth [name]'s daughter was diagnosed with bilateral breast cancer, so the patient and her younger daughter were tested for genetic susceptibility which returned positive.Beth [name]. This indicates a higher susceptibility for breast cancer as well as ovarian cancer. She has regular mammograms and follows with Dr. Rolly Pancake.     History 2017, CD Beth [name] is an established patient of my practice. 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  She also  has a history of hypertension, osteopenia, vertigo and some mild hearing loss, classified as vestibulitis in her older records. Surgical history is positive for right foot fracture in May 2010 and 4 surgeries to the ankle. Her ankle was fractured in a seizure related incident 2010, as she twisted her foot in the midst of her typical body movements during a seizure.  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  She is taking 20 mg daily. She also takes vitamin D against fatigue. Her affect is better controlled and she feels more assured, and now is less socially isolated.  Mrs. Mechling had been seizure-free for over 12 months by the time of her April 2014 visit, now she reports that she had one seizure around Christmas 2014, while compliant on her current regimen of Keppra and Clonazepam. She fills her medications at [facility] on [address]  in [place].Vestibular evaluation for severe vertigo.   Social history : The patient is 76 years of age she is right-handed , married lives with her husband Beth [name], the couple has 2 adult children.   The patient is a nonsmoker non-alcohol drinker , she may drink 2 cups of caffeine daily  And she has never used illegal drugs . She lost her mother in November 2014  (Thanksgiving) at age 81, while in hospice in [place]. [name] was sleep deprived from visiting again and again.  She had in the past very irregular sleep habits and sleep hygiene has been discussed, but failed to be implemented to help her to get more restful nocturnal sleep. Her family history in short:  patient's mother had age-related dementia, she also suffered a stroke in the past,  the patient's daughter had breast cancer and seizures.  Interval  History : 10-11-14 Patient returned for a yearly follow up. She reports medical problems:  a  carbuncle with MRSA, diagnosed spring 2016  and treated with ATB  ( doxicycline) by Dr. Amy Martinique , Dermatologist .The patient tolerated the oral doxycycline fine. Beth [name]'s cognitive status remains stable we performed a Montral cognitive assessment test today and she scored well on the clock drawing she has a mild tremor which impairs the contour of the clock face, she scored well on this Trail Making Test name 3 animals had some trouble again this tremor on drawing a cube. Was able to do the attention tests but not subtraction. Language and abstraction were tested the patient only had a word fluency test of 9 points. She was able to score 4 out of 5 recall words and was fully oriented to date place and time. She scored 26 out of 30 points, is a HS graduate. 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 No seizures in the interval time.   Review of Systems: Out of a complete 14 system review, the patient complains of only the following symptoms, and all other reviewed systems are negative. Geriatric depression score was endorsed at 3 points out of 15. Erratic sleep pattern,  Insomnia at night and daytime sleepiness reported.  the patient further endorsed in her review of systems today cold and heat intolerance, light sensitivity and eye itching, runny nose, rectal pain, she has developed a rectocele, diverticulitis  joint pain or joint swelling were endorsed. But no new psychiatric or neurological problems .    Social History   Social History  . Marital status: Married    Spouse name: Beth [name]  . Number of children: 2  . Years of education: 12   Occupational History  . Not on file.   Social History Main Topics  . Smoking status: Former Smoker    Packs/day: 1.00    Years: 41.00    Quit date: 01/13/2006  . Smokeless tobacco: Never Used  . Alcohol use No  . Drug use: No  . Sexual activity: No   Other Topics Concern  . Not on file    Social History Narrative   Patient is married ([name]) and lives at home with her husband.   Patient is retired.   Patient has two children.   Patient has a high school education.   Patient is right-handed.   Patient drinks 2 big mugs of coffee daily and sometimes tea in the evenings.    Family History  Problem Relation Age of Onset  . Dementia Mother   . Stroke Mother   . Thyroid disease Mother   . Osteoporosis Mother   . Ovarian cancer Paternal Grandmother 30  . Ovarian cancer Paternal Aunt 38  . Breast cancer Daughter 47    breast ca x2, bilateral dx. 38, 46--estrogen  . Other Daughter     no genetic testing as of 07/2015; has had ovaries removed  . Other Daughter     BRCA 2 Pos.  . Colon cancer Maternal Aunt     dx. unspecified age  .  Emphysema Maternal Uncle   . High blood pressure Maternal Grandmother   . Dementia Maternal Grandfather   . Bleeding Disorder Maternal Grandfather   . Emphysema Maternal Aunt   . Emphysema Maternal Aunt     Past Medical History:  Diagnosis Date  . BRCA2 positive 2017  . Diverticulosis   . Hearing loss   . HTN (hypertension)   . Memory loss   . Seizure disorder, complex partial, 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[place]) seizure free for one year  . Seizures ([place])   . 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   . Vestibulitis of ear     Past Surgical History:  Procedure Laterality Date  . broken ankle Right   . CEREBRAL ANEURYSM REPAIR    . TONSILLECTOMY      Current Outpatient Prescriptions  Medication Sig Dispense Refill  . albuterol (PROVENTIL HFA;VENTOLIN HFA) 108 (90 Base) MCG/ACT inhaler Inhale 2 puffs into the lungs every 4 (four) hours as needed for wheezing or shortness of breath. 1 Inhaler 5  . aspirin EC 81 MG tablet Take 81 mg by mouth daily.    Marland Kitchen atenolol (TENORMIN) 50 MG tablet TAKE 1/2 TABLET BY MOUTH TWICE A DAY    . captopril (CAPOTEN) 50 MG tablet Take 50 mg by mouth 2 (two) times daily.    . Cholecalciferol (VITAMIN  D3) 2000 units capsule 1 tablet by mouth daily    . clonazePAM (KLONOPIN) 2 MG tablet Take 1 tablet (2 mg total) by mouth at bedtime. 90 tablet 1  . fexofenadine (ALLEGRA) 180 MG tablet Take 180 mg by mouth daily.     . fluticasone (FLONASE) 50 MCG/ACT nasal spray Place 2 sprays into the nose daily as needed for allergies. Spray twice daily as needed    . Fluticasone-Umeclidin-Vilant (TRELEGY ELLIPTA) 100-62.5-25 MCG/INH AEPB Inhale 1 puff into the lungs daily. 1 each 0  . guaiFENesin (MUCINEX) 600 MG 12 hr tablet Take 1,200 mg by mouth 2 (two) times daily as needed for cough.     Marland Kitchen KEPPRA 750 MG tablet Take 1 tablet (750 mg total) by mouth 2 (two) times daily. 180 tablet 3  . LEXAPRO 20 MG tablet Take 1 tablet (20 mg total) by mouth daily. Morning dosage 90 tablet 3  . meloxicam (MOBIC) 15 MG tablet 1 tablet    . simvastatin (ZOCOR) 10 MG tablet Take 10 mg by mouth at bedtime.    . triamterene-hydrochlorothiazide (MAXZIDE-25) 37.5-25 MG tablet Take 1 tablet by mouth daily.    Marland Kitchen umeclidinium-vilanterol (ANORO ELLIPTA) 62.5-25 MCG/INH AEPB Inhale 1 puff daily     No current facility-administered medications for this visit.     Allergies as of 04/08/2016 - Review Complete 04/08/2016  Allergen Reaction Noted  . Prozac [fluoxetine hcl] Other (See Comments) 05/12/2012  . Sulfa antibiotics Nausea And Vomiting and Other (See Comments) 08/18/2014  . Levaquin [levofloxacin in d5w] Other (See Comments) 04/05/2013  . Micardis [telmisartan] Swelling and Other (See Comments) 05/12/2012  . Prednisone Other (See Comments) 05/12/2012  . Lisinopril-hydrochlorothiazide Other (See Comments) 09/13/2015  . Augmentin [amoxicillin-pot clavulanate] Rash 05/12/2012  . Avelox [moxifloxacin hcl in nacl] Rash 05/12/2012  . Norvasc [amlodipine besylate] Other (See Comments) 05/12/2012    Vitals: BP 136/68   Pulse 60   Resp 20   Ht _0  (1.6 m)   Wt 251 lb (113.9 kg)   LMP 01/14/1995 (Approximate)   BMI 44.46  kg/m  Last Weight:  Wt Readings from Last 1 Encounters:  04/08/16 251  lb (113.9 kg)   Last Height:   Ht Readings from Last 1 Encounters:  04/08/16 _0  (1.6 m)    Physical exam:  General: The patient is awake, alert and appears not in acute distress. The patient is well groomed.  Head: Normocephalic, atraumatic. Neck is supple. Mallampati 3 ,  Neck circumference 15.75    ;Cardiovascular:  Regular rate and rhythm- without  murmurs or carotid bruit, and without distended neck veins. Respiratory: Lungs are clear to auscultation. Skin:  Without evidence of edema, or rash Trunk: BMI is elevated,  morbidly obese,  But the  patient has normal posture.  Neurologic exam : The patient is awake and alert, oriented to place and time.  Memory subjective impaired. MMSE 30-30 in 2013. 2016 at  26-30 points.    Montreal Cognitive Assessment  04/08/2016 10/10/2015 10/11/2014  Visuospatial/ Executive (0/5) _1 Naming (0/3) _2 Attention: Read list of digits (0/2) _3 Attention: Read list of letters (0/1) _4 Attention: Serial 7 subtraction starting at 100 (0/3) _5 Language: Repeat phrase (0/2) _6 Language : Fluency (0/1) 0 1 0  Abstraction (0/2) _7 Delayed Recall (0/5) _8 Orientation (0/6) _9 Total _10 Adjusted Score (based on education) 26 - 27     There is a reduced  attention span & concentration ability.  Speech is non- fluent with mild  aphasia.  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  Her mood is affected. Patient reports that she has been made aware of her sometimes tangential thought process or circumferential speech. This is reflected in word generation, and was noted in MOCA/  At times it seems as if her current to some degree of her feelings, and but I assume that this is a manifestation of her previous brain injury and part of the brain organic Psychological syndrome. IPupils are equal and briskly reactive to  light. No abnormalities by fundoscopic exam. No ptosis.  Eye movements :  non - smooth horizontal persuit in either direction. Visual fields by finger perimetry are intact. Hearing to finger rub intact intact . Facial sensation intact to fine touch. Facial motor strength is symmetric and tongue and uvula move midline.   Coordination is intact in finger to nose maneuver and in  rapid alternating movements . Equal grip strength, no atrophy, no rigor, no tremor.  . Both upper extremities have normal metria and taxus , the finger-to-nose testing was intact , perhaps slightly slower on the right . The patient is able to walk without assistance -. she has a wider based gait at baseline, walks careful. There is brisker DTR response on the left side, indicative of mild spasticity following her brain injuries. .  No upgoing Babinski response.   Assessment:  After physical and neurologic examination, review of laboratory studies, imaging, neurophysiology testing and pre-existing records, assessment is :  #1 mild cognitive impairment that  is stable , and causative related to her brain injuries.  #2 word finding difficulties, aphagia also stabilized. Slower word flow is noted.  #3 seizure disorder following aneurysm bleed, no seizure activity for over 3 years patient will be maintained on the same medications.  #4 newly diagnosed COPD which could have had an effect on cerebrovascular disease as well especially microvascular disease. At this time I see no need for a new  MRI as neither seizure activity nor mental status have given reason for concern. The patient will follow-up yearly with a MOCA test. She may see NP alternating with me.      Ziair Penson, MD  CC  Dr Lucile Shutters [facility]/ [address]. CC: Dr Viann Fish at [place] , [place].  CC: Cardiologist  is Dr. Jenkins Rouge.

## 2016-04-09 ENCOUNTER — Encounter: Payer: Self-pay | Admitting: Oncology

## 2016-04-09 ENCOUNTER — Telehealth: Payer: Self-pay | Admitting: Oncology

## 2016-04-09 ENCOUNTER — Telehealth: Payer: Self-pay | Admitting: Neurology

## 2016-04-09 ENCOUNTER — Other Ambulatory Visit: Payer: Self-pay

## 2016-04-09 DIAGNOSIS — F09 Unspecified mental disorder due to known physiological condition: Secondary | ICD-10-CM

## 2016-04-09 DIAGNOSIS — I609 Nontraumatic subarachnoid hemorrhage, unspecified: Secondary | ICD-10-CM

## 2016-04-09 DIAGNOSIS — G40409 Other generalized epilepsy and epileptic syndromes, not intractable, without status epilepticus: Secondary | ICD-10-CM

## 2016-04-09 NOTE — Telephone Encounter (Signed)
Cld the pt to schedule an appt w/Dr. Jana Hakim on 4/18 at 4pm. Pt aware to arrive 30 minutes early. Demographics verified. Letter mailed.

## 2016-04-09 NOTE — Telephone Encounter (Signed)
Received a fax from [facility] asking for a prescription for pt's klonopin. I think Dr. Brett Fairy may have given this RX to the pt at the visit yesterday. Will send to Dr. Brett Fairy for review.

## 2016-04-09 NOTE — Telephone Encounter (Signed)
Patient's husband in the lobby with Rx that was given to his wife yesterday. He has questions regarding [facility] faxing it to them instead  He willing to wait a few minutes; otherwise call him at [phone].

## 2016-04-09 NOTE — Telephone Encounter (Signed)
I spoke to pt's husband in the lobby. He wants me to fax the RX from yesterday to [facility] and he brought it to me. Faxed RX. Received a receipt of confirmation.

## 2016-04-15 NOTE — Progress Notes (Signed)
PFT done.  

## 2016-04-23 DIAGNOSIS — E119 Type 2 diabetes mellitus without complications: Secondary | ICD-10-CM | POA: Diagnosis not present

## 2016-04-23 DIAGNOSIS — Z6841 Body Mass Index (BMI) 40.0 and over, adult: Secondary | ICD-10-CM | POA: Diagnosis not present

## 2016-04-23 DIAGNOSIS — F329 Major depressive disorder, single episode, unspecified: Secondary | ICD-10-CM | POA: Diagnosis not present

## 2016-04-23 DIAGNOSIS — E784 Other hyperlipidemia: Secondary | ICD-10-CM | POA: Diagnosis not present

## 2016-04-23 DIAGNOSIS — R569 Unspecified convulsions: Secondary | ICD-10-CM | POA: Diagnosis not present

## 2016-04-23 DIAGNOSIS — L749 Eccrine sweat disorder, unspecified: Secondary | ICD-10-CM | POA: Diagnosis not present

## 2016-04-23 DIAGNOSIS — E6609 Other obesity due to excess calories: Secondary | ICD-10-CM | POA: Diagnosis not present

## 2016-04-23 DIAGNOSIS — R61 Generalized hyperhidrosis: Secondary | ICD-10-CM | POA: Diagnosis not present

## 2016-04-23 DIAGNOSIS — J449 Chronic obstructive pulmonary disease, unspecified: Secondary | ICD-10-CM | POA: Diagnosis not present

## 2016-04-23 DIAGNOSIS — Z0000 Encounter for general adult medical examination without abnormal findings: Secondary | ICD-10-CM | POA: Diagnosis not present

## 2016-04-23 DIAGNOSIS — M2550 Pain in unspecified joint: Secondary | ICD-10-CM | POA: Diagnosis not present

## 2016-04-23 DIAGNOSIS — I10 Essential (primary) hypertension: Secondary | ICD-10-CM | POA: Diagnosis not present

## 2016-04-29 ENCOUNTER — Telehealth: Payer: Self-pay | Admitting: Neurology

## 2016-04-29 NOTE — Telephone Encounter (Signed)
Patient calling to advise her PCP Dr. Kathyrn Lass gave her Rx for Mirpazapine and there is a warning on the Rx that it may cause seizures or strokes. Please all and discuss.

## 2016-04-29 NOTE — Telephone Encounter (Signed)
Beth [name] was placed on mirtazapine also known as Remeron 15 mg dose. She is concerned that this could cause seizures or could lower the threshold for future seizures. I explained to her that it is known to cause confusion in some patients - that I would not like her to drive, operate machinery,  but these abilities are already limited to the current medication regimen and her medical history.  She will discuss her concerns with her primary care physician who is the prescriber. CD

## 2016-04-30 ENCOUNTER — Ambulatory Visit (HOSPITAL_BASED_OUTPATIENT_CLINIC_OR_DEPARTMENT_OTHER): Payer: Medicare Other | Admitting: Oncology

## 2016-04-30 VITALS — BP 134/57 | HR 63 | Temp 98.2°F | Ht 63.0 in | Wt 254.0 lb

## 2016-04-30 DIAGNOSIS — Z803 Family history of malignant neoplasm of breast: Secondary | ICD-10-CM

## 2016-04-30 DIAGNOSIS — Z1509 Genetic susceptibility to other malignant neoplasm: Principal | ICD-10-CM

## 2016-04-30 DIAGNOSIS — Z1502 Genetic susceptibility to malignant neoplasm of ovary: Secondary | ICD-10-CM | POA: Diagnosis not present

## 2016-04-30 DIAGNOSIS — Z1501 Genetic susceptibility to malignant neoplasm of breast: Secondary | ICD-10-CM

## 2016-04-30 DIAGNOSIS — Z1379 Encounter for other screening for genetic and chromosomal anomalies: Secondary | ICD-10-CM

## 2016-04-30 DIAGNOSIS — Z8041 Family history of malignant neoplasm of ovary: Secondary | ICD-10-CM | POA: Diagnosis not present

## 2016-04-30 MED ORDER — ANASTROZOLE 1 MG PO TABS: 1.0000 mg | ORAL_TABLET | Freq: Every day | ORAL | 4 refills | Status: DC

## 2016-04-30 NOTE — Progress Notes (Signed)
[place]  [phone] [phone]     ID: Beth [name] DOB: 1940/12/05  MR#: [id number]  ZYY#:[id number]  Patient Care Team: Kathyrn Lass, MD as PCP - General (Family Medicine) Nunzio Cobbs, MD as Consulting Physician (Obstetrics and Gynecology) Chauncey Cruel, MD as Consulting Physician (Oncology) Josue Hector, MD as Consulting Physician (Cardiology) Rigoberto Noel, MD as Consulting Physician (Pulmonary Disease) Larey Seat, MD as Consulting Physician (Neurology) Amy Martinique, MD as Consulting Physician (Dermatology) Elsie Saas, MD as Consulting Physician (Orthopedic Surgery) Wonda Horner, MD as Consulting Physician (Gastroenterology) Chauncey Cruel, MD OTHER MD:  CHIEF COMPLAINT: 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 Beth [name] subsequent had a second cancer which she also has survived. On the father's side the patient has little information except that her paternal grandmother had ovarian cancer and one of the paternal aunts also had ovarian cancer, both of them dying in their 70s.  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  She was referred for consideration of risk reducon and intensified screening strategies.  INTERVAL HISTORY: Beth [name] was evaluated in the high risk cancer clinic 04/30/2016 accompanied by her husband Beth [name]: Beth [name]'s main problem is significant COPD  Related to her earlier history of smoking. She quit in 2008. She is now on oxygen 24 /7.  She fatigues easily. She also had a small stroke, which has caused her to have a seizure  disorder, although this now appears to be well-controlled. She is not aware of any change in either of her breasts. She denies abdominal bloating, masses, cramps, or pain. There has been no significant change in bowel or bladder habits. A detailed review of systems today was  Alsosignificant for joint aches here and there, not more intense or persistent than e, some blurred vision (she tells me she is overdue for 9 exam), stress urinary incontinence forgetfulness and concerns regarding her weight and sugar levels. A detailed review of systems today was otherwise  noncontributory  PAST MEDICAL HISTORY: Past Medical History:  Diagnosis Date  . BRCA2 positive 2017  . Diverticulosis   . Hearing loss   . HTN (hypertension)   . Memory loss   . Seizure disorder, complex partial, 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[place]) seizure free for one year  . Seizures ([place])   . 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   . Vestibulitis of ear     PAST SURGICAL HISTORY: Past Surgical History:  Procedure Laterality Date  . broken ankle Right   . CEREBRAL ANEURYSM REPAIR    . TONSILLECTOMY      FAMILY HISTORY Family History  Problem Relation Age of Onset  . Dementia Mother   . Stroke Mother   . Thyroid disease Mother   . Osteoporosis Mother   . Ovarian cancer Paternal Grandmother 42  . Ovarian cancer Paternal Aunt 61  . Breast cancer Daughter 1    breast ca x2, bilateral dx. 38, 46--estrogen  . Other Daughter     no genetic testing as of 07/2015; has had ovaries removed  . Other Daughter     BRCA 2 Pos.  . Colon cancer Maternal  Aunt     dx. unspecified age  . Emphysema Maternal Uncle   . High blood pressure Maternal Grandmother   . Dementia Maternal Grandfather   . Bleeding Disorder Maternal Grandfather   . Emphysema Maternal Aunt   . Emphysema Maternal Aunt     GYNECOLOGIC HISTORY:  Patient's last menstrual period was 01/14/1995 (approximate). Menarche age 6, first live birth age 79. The  patient is GX to. 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 Her husband Beth [name] is a retired Charity fundraiser. He also worked for the [employer]. At home is just the 2 of them plus their dog. The patient's daughter Beth [name] has not yet been tested for the BRCA gene. The patient's younger daughter Beth [name] is BRCA2 positive and she has undergone a total abdominal hysterectomy with bilateral salpingo-oophorectomy and also bilateral mastectomies.    ADVANCED DIRECTIVES: The patient's husband is her healthcare power of attorney     HEALTH MAINTENANCE: Social History  Substance Use Topics  . Smoking status: Former Smoker    Packs/day: 1.00    Years: 41.00    Quit date: 01/13/2006  . Smokeless tobacco: Never Used  . Alcohol use No     Colonoscopy: [place]  PAP:  Bone density: "normal"   Allergies  Allergen Reactions  . Prozac [Fluoxetine Hcl] Other (See Comments)    seizures  . Sulfa Antibiotics Nausea And Vomiting and Other (See Comments)    TINGLING IN LEGS ALSO  . Levaquin [Levofloxacin In D5w] Other (See Comments)    Mood changes? Questionable per patient-IF PATIENTS NEEDS MEDICATION, PREFERS TO USE LEVAQUIN-DUE TO WORKING GREAT FOR PATIENT.   . Micardis [Telmisartan] Swelling and Other (See Comments)    Swelling, bruising Swelling, bruising  . Prednisone Other (See Comments)    mood changes Mood changes  . Lisinopril-Hydrochlorothiazide Other (See Comments)    unknown  . Augmentin [Amoxicillin-Pot Clavulanate] Rash  . Avelox [Moxifloxacin Hcl In Nacl] Rash    LEVAQUIN IS OK-SEE NOTE  . Norvasc [Amlodipine Besylate] Other (See Comments)    fatigue    Current Outpatient Prescriptions  Medication Sig Dispense Refill  . albuterol (PROVENTIL HFA;VENTOLIN HFA) 108 (90 Base) MCG/ACT inhaler Inhale 2 puffs into the lungs every 4 (four) hours as needed for  wheezing or shortness of breath. 1 Inhaler 5  . aspirin EC 81 MG tablet Take 81 mg by mouth daily.    Marland Kitchen atenolol (TENORMIN) 50 MG tablet TAKE 1/2 TABLET BY MOUTH TWICE A DAY    . captopril (CAPOTEN) 50 MG tablet Take 50 mg by mouth 2 (two) times daily.    . Cholecalciferol (VITAMIN D3) 2000 units capsule 1 tablet by mouth daily    . clonazePAM (KLONOPIN) 2 MG tablet Take 1 tablet (2 mg total) by mouth at bedtime. 90 tablet 1  . fexofenadine (ALLEGRA) 180 MG tablet Take 180 mg by mouth daily.     . fluticasone (FLONASE) 50 MCG/ACT nasal spray Place 2 sprays into the nose daily as needed for allergies. Spray twice daily as needed    . Fluticasone-Umeclidin-Vilant (TRELEGY ELLIPTA) 100-62.5-25 MCG/INH AEPB Inhale 1 puff into the lungs daily. 1 each 0  . guaiFENesin (MUCINEX) 600 MG 12 hr tablet Take 1,200 mg by mouth 2 (two) times daily as needed for cough.     Marland Kitchen KEPPRA 750 MG tablet Take 1 tablet (750 mg total) by  mouth 2 (two) times daily. 180 tablet 3  . LEXAPRO 20 MG tablet Take 1 tablet (20 mg total) by mouth daily. Morning dosage 90 tablet 3  . meloxicam (MOBIC) 15 MG tablet 1 tablet    . simvastatin (ZOCOR) 10 MG tablet Take 10 mg by mouth at bedtime.    . triamterene-hydrochlorothiazide (MAXZIDE-25) 37.5-25 MG tablet Take 1 tablet by mouth daily.    Marland Kitchen umeclidinium-vilanterol (ANORO ELLIPTA) 62.5-25 MCG/INH AEPB Inhale 1 puff daily     No current facility-administered medications for this visit.     OBJECTIVE:Morbidly obese white woman wearing an oxygen cannula  Vitals:   04/30/16 1553  BP: (!) 134/57  Pulse: 63  Temp: 98.2 F (36.8 C)     Body mass index is 44.99 kg/m.    ECOG FS:2 - Symptomatic, <50% confined to bed  Ocular: Sclerae unicteric, pupils round and equal  Ear-nose-throat: Oropharynx clear and moist Lymphatic: No cervical or supraclavicular adenopathy Lungs no rales or rhonchi Heart regular rate and rhythm Abd soft,  obese, nontender, positive bowel sounds, No  masses palpated  MSK  mild kyphosis but no focal spinal tenderness Neuro: non-focal, well-oriented,positive  affect Breasts: I do not palpate a mass in either breast. There are no skin or nipple changes of concern. Both axillae are benign.   LAB RESULTS:  CMP     Component Value Date/Time   NA 134 (L) 07/30/2015 2103   K 3.3 (L) 07/30/2015 2103   CL 97 (L) 07/30/2015 2103   CO2 27 07/30/2015 2103   GLUCOSE 176 (H) 07/30/2015 2103   BUN 11 07/30/2015 2103   CREATININE 0.80 07/30/2015 2103   CALCIUM 9.3 07/30/2015 2103   PROT 7.1 08/18/2014 1522   ALBUMIN 3.7 08/18/2014 1522   AST 22 08/18/2014 1522   ALT 19 08/18/2014 1522   ALKPHOS 46 08/18/2014 1522   BILITOT 0.6 08/18/2014 1522   GFRNONAA >60 07/30/2015 2103   GFRAA >60 07/30/2015 2103    No results found for: TOTALPROTELP, ALBUMINELP, A1GS, A2GS, BETS, BETA2SER, GAMS, MSPIKE, SPEI  No results found for: Nils Pyle, [facility]  Lab Results  Component Value Date   WBC 9.1 07/30/2015   NEUTROABS 6.0 06/10/2008   HGB 14.7 07/30/2015   HCT 43.0 07/30/2015   MCV 95.3 07/30/2015   PLT 247 07/30/2015      Chemistry      Component Value Date/Time   NA 134 (L) 07/30/2015 2103   K 3.3 (L) 07/30/2015 2103   CL 97 (L) 07/30/2015 2103   CO2 27 07/30/2015 2103   BUN 11 07/30/2015 2103   CREATININE 0.80 07/30/2015 2103      Component Value Date/Time   CALCIUM 9.3 07/30/2015 2103   ALKPHOS 46 08/18/2014 1522   AST 22 08/18/2014 1522   ALT 19 08/18/2014 1522   BILITOT 0.6 08/18/2014 1522       No results found for: LABCA2  No components found for: ELFYBO175  No results for input(s): INR in the last 168 hours.  Urinalysis    Component Value Date/Time   COLORURINE YELLOW 08/18/2014 1742   APPEARANCEUR CLEAR 08/18/2014 1742   LABSPEC 1.006 08/18/2014 1742   PHURINE 6.5 08/18/2014 1742   GLUCOSEU NEGATIVE 08/18/2014 1742   HGBUR TRACE (A) 08/18/2014 1742   BILIRUBINUR NEGATIVE 08/18/2014 1742     KETONESUR NEGATIVE 08/18/2014 1742   PROTEINUR NEGATIVE 08/18/2014 1742   UROBILINOGEN 0.2 08/18/2014 1742   NITRITE NEGATIVE 08/18/2014 1742   LEUKOCYTESUR TRACE (A)  08/18/2014 1742     STUDIES: CT angiogram of the chest from 08/08/2015 and CT of the abdomen and pelvis from 04/09/2012 reviewed   ELIGIBLE FOR AVAILABLE RESEARCH PROTOCOL: No  ASSESSMENT: 75 y.o. BRCA2 positive [place] woman at increased risk for breast and ovarian cancer  (a) the specific BRCA2 mutations is [id number]).   (1) consider anastrozole for breast cancer risk reduction  (a) DEXA scan 01/16/2014 normal, with a T score of 0.7  (2) yearly MRI in addition to yearly mammography for intensify breast cancer screening  (3) consider bilateral salpingo-oophorectomy--will require pulmonary clearance  PLAN: We spent the better part of today's hour-long appointment discussing the biology of Shivali's BRCA mutation and its implications and terse of cancer risk. She understands that the BRCA genes code for proteins that participate in correcting DNA transcription so that when one of the genes does not work well more DNA mistakes are made. These DNA mistakes cannot up and increase the risk of cancer developing. It is not well understood why breast and ovarian cancer are a more frequent consequence of his mutations although other cancers also are implicated.  We discussed the fact that the lifetime risk of BRCA2 mutation carriers can be as high as 80% for breast cancer and 30% for ovarian cancer. We also discussed that these are 2 very different tumors and they need to be approached separately.  With regards to breast cancer there are 2 main risk reduction strategies. One of them is bilateral mastectomies. I would not recommend that in Beth [name]'s case given the concerns regarding surgical risks from her COPD. His second strategy however, anastrozole, would be very reasonable in her case. This is an  expensive pill which taken daily for 5 years will cut her risk of breast cancer in half.  We discussed the possible toxicities, side effects and complications of anastrozole and I wrote a prescription for Beth [name] so she may start this pill if she decides to try it at some point in the future (she did not feel ready  to make a decision today).  Bell dealing with breast cancer, we discussed intensified screening strategies. This would involve alternating between yearly mammogram and yearly breast MRI, usually scheduled 6 months apart. We discussed the risk of false positives with breast MRI screening. We also discussed cost issues. She is very interested in pursuing this. She is already scheduled for mammography in May. I am scheduling her for a breast MRI in November.  Moving on to ovarian cancer, we reviewed the fact that the best available screening strategies namely periodic pelvic and transvaginal ultrasonography and CA 125 blood tests have not been proven to save lives. The only proven risk reduction strategy is bilateral salpingo-oophorectomy. This can be done laparoscopically, and after reviewing her pulmonary function tests I think she may be a candidate. She will need pulmonary clearance. I have gone ahead and placed a referral our gynecological oncologist to discuss further with the patient. Beth [name] is very interested in pursuing this option as well  Tentatively I have made her a return appointment with me in November, after her breast MRI, but if she decides to start the anastrozole I will probably ask her to come back in about 3 months to make sure she is tolerating it well.  She has a good understanding of this plan. She agrees with it. She knows the goal of treatment in her case is prevention. She will call with any problems that may develop before her next visit here.  Chauncey Cruel, MD   04/30/2016 5:27 PM Medical Oncology and Hematology [facility] [address] [place], [place] [address] Tel. [phone]    Fax. [phone]

## 2016-05-02 ENCOUNTER — Telehealth: Payer: Self-pay | Admitting: *Deleted

## 2016-05-02 NOTE — Telephone Encounter (Signed)
I have called and left the patient a message to call the office back for an appt. Patient needs new patient appt

## 2016-05-05 ENCOUNTER — Telehealth: Payer: Self-pay | Admitting: *Deleted

## 2016-05-05 NOTE — Telephone Encounter (Signed)
I have called and left a message to call the office back. Patient needs to be scheduled for a new patient appt.

## 2016-05-05 NOTE — Telephone Encounter (Signed)
Patient called and I gave her the appt date/time for May 16th.

## 2016-05-22 DIAGNOSIS — H5211 Myopia, right eye: Secondary | ICD-10-CM | POA: Diagnosis not present

## 2016-05-22 DIAGNOSIS — H1045 Other chronic allergic conjunctivitis: Secondary | ICD-10-CM | POA: Diagnosis not present

## 2016-05-22 DIAGNOSIS — I10 Essential (primary) hypertension: Secondary | ICD-10-CM | POA: Diagnosis not present

## 2016-05-22 DIAGNOSIS — H52223 Regular astigmatism, bilateral: Secondary | ICD-10-CM | POA: Diagnosis not present

## 2016-05-22 DIAGNOSIS — H35033 Hypertensive retinopathy, bilateral: Secondary | ICD-10-CM | POA: Diagnosis not present

## 2016-05-22 DIAGNOSIS — H04123 Dry eye syndrome of bilateral lacrimal glands: Secondary | ICD-10-CM | POA: Diagnosis not present

## 2016-05-22 DIAGNOSIS — Z961 Presence of intraocular lens: Secondary | ICD-10-CM | POA: Diagnosis not present

## 2016-05-28 ENCOUNTER — Encounter: Payer: Self-pay | Admitting: Gynecologic Oncology

## 2016-05-28 ENCOUNTER — Ambulatory Visit: Payer: Medicare Other | Attending: Gynecologic Oncology | Admitting: Gynecologic Oncology

## 2016-05-28 VITALS — BP 103/60 | HR 60 | Temp 97.6°F | Resp 22 | Ht 64.0 in | Wt 244.3 lb

## 2016-05-28 DIAGNOSIS — Z1509 Genetic susceptibility to other malignant neoplasm: Secondary | ICD-10-CM

## 2016-05-28 DIAGNOSIS — Z6841 Body Mass Index (BMI) 40.0 and over, adult: Secondary | ICD-10-CM | POA: Diagnosis not present

## 2016-05-28 DIAGNOSIS — Z880 Allergy status to penicillin: Secondary | ICD-10-CM | POA: Diagnosis not present

## 2016-05-28 DIAGNOSIS — Z800 Family history of malignant neoplasm of digestive organs: Secondary | ICD-10-CM | POA: Diagnosis not present

## 2016-05-28 DIAGNOSIS — Z8673 Personal history of transient ischemic attack (TIA), and cerebral infarction without residual deficits: Secondary | ICD-10-CM | POA: Insufficient documentation

## 2016-05-28 DIAGNOSIS — Z79899 Other long term (current) drug therapy: Secondary | ICD-10-CM | POA: Diagnosis not present

## 2016-05-28 DIAGNOSIS — Z1501 Genetic susceptibility to malignant neoplasm of breast: Secondary | ICD-10-CM | POA: Diagnosis not present

## 2016-05-28 DIAGNOSIS — G40802 Other epilepsy, not intractable, without status epilepticus: Secondary | ICD-10-CM | POA: Diagnosis not present

## 2016-05-28 DIAGNOSIS — I10 Essential (primary) hypertension: Secondary | ICD-10-CM | POA: Insufficient documentation

## 2016-05-28 DIAGNOSIS — Z820 Family history of epilepsy and other diseases of the nervous system: Secondary | ICD-10-CM | POA: Insufficient documentation

## 2016-05-28 DIAGNOSIS — Z1502 Genetic susceptibility to malignant neoplasm of ovary: Secondary | ICD-10-CM

## 2016-05-28 DIAGNOSIS — Z888 Allergy status to other drugs, medicaments and biological substances status: Secondary | ICD-10-CM | POA: Insufficient documentation

## 2016-05-28 DIAGNOSIS — Z9889 Other specified postprocedural states: Secondary | ICD-10-CM | POA: Insufficient documentation

## 2016-05-28 DIAGNOSIS — Z825 Family history of asthma and other chronic lower respiratory diseases: Secondary | ICD-10-CM | POA: Diagnosis not present

## 2016-05-28 DIAGNOSIS — Z823 Family history of stroke: Secondary | ICD-10-CM | POA: Diagnosis not present

## 2016-05-28 DIAGNOSIS — Z9981 Dependence on supplemental oxygen: Secondary | ICD-10-CM | POA: Diagnosis not present

## 2016-05-28 DIAGNOSIS — Z87891 Personal history of nicotine dependence: Secondary | ICD-10-CM | POA: Insufficient documentation

## 2016-05-28 DIAGNOSIS — J449 Chronic obstructive pulmonary disease, unspecified: Secondary | ICD-10-CM | POA: Insufficient documentation

## 2016-05-28 DIAGNOSIS — Z8041 Family history of malignant neoplasm of ovary: Secondary | ICD-10-CM | POA: Diagnosis not present

## 2016-05-28 DIAGNOSIS — Z803 Family history of malignant neoplasm of breast: Secondary | ICD-10-CM | POA: Insufficient documentation

## 2016-05-28 DIAGNOSIS — Z7982 Long term (current) use of aspirin: Secondary | ICD-10-CM | POA: Diagnosis not present

## 2016-05-28 NOTE — Patient Instructions (Signed)
We will reach out to your physicians about obtaining surgical clearance.  Once we have heard from your physicians, we will contact you and have you come into the office to discuss the plan. Please call for any questions or concerns.

## 2016-05-28 NOTE — Progress Notes (Signed)
Consult Note: Gyn-Onc  Beth [name] 76 y.o. female  CC:  Chief Complaint  Patient presents with  . BRCA 2 positive    HPI: Patient is seen today in consultation at the request of Dr. Dr. Jana Hakim.  Primary care physician is Dr. Kathyrn Lass Primary gynecologist is Dr. Josefa Half Cardiologist is Dr. Jenkins [place] Pulmonologist is Dr. Kavin Leech  Patient is a 76 year old gravida2 para2 who is BRCA 2 gene positive. She was diagnosed and opted for testing for BRCA mutation as her daughter had a personal history of breast cancer was tested. As her daughter was found to be positive she separately had testing and was found to be positive. It appears that this may be rising from the paternal side. The patient's paternal hearing cancer and one of her paternal aunts had ovarian cancer. Per history her paternal grandmother had ovarian cancer age of 48 and her paternal aunt had ovarian cancer at the age of 68.  She comes in today for discussion of prophylactic surgery. Her past medical history 6 complicated by COPD. The patient is oxygen dependent were sent 24 hours a day. She's also had a stroke which has caused her to have a seizure disorder which is fairly well managed on Kepra. She suffers from fairly significant short term memory loss per her report. She took OCPs for about 10-15 years total. She went through menopause in her late 91s and took HRT briefly but stopped with results from the nurses health study. She has never had any abdominal surgery.   She had been having ultrasounds and CA-125s done with Dr. Quincy Simmonds.  3/18 endometrial biopsy: No hyperplasia or malignancy were seen. CA-125: 12 Ultrasound: showed fluid in endometrial cavity, ovaries not well seen.  Review of Systems: Constitutional: Feels tired. Denies fever. No weight changes, can go up a flight of stairs, she is limited by SOB and has to do "purse" breathing, no chest pain Skin: No rash Cardiovascular: No chest pain,  +shortness of breath, -edema  Pulmonary: No cough currently  Gastro Intestinal: No nausea, vomiting. No change in bowel movement.  Genitourinary: Denies vaginal bleeding and discharge.  Neurologic: No seizures since 2009, worsening short term memory Psychology: No complaints Current Meds:  Outpatient Encounter Prescriptions as of 05/28/2016  Medication Sig  . aspirin EC 81 MG tablet Take 81 mg by mouth daily.  Marland Kitchen atenolol (TENORMIN) 50 MG tablet TAKE 1/2 TABLET BY MOUTH TWICE A DAY  . captopril (CAPOTEN) 50 MG tablet Take 50 mg by mouth 2 (two) times daily.  . Cholecalciferol (VITAMIN D3) 2000 units capsule 1 tablet by mouth daily  . clonazePAM (KLONOPIN) 2 MG tablet Take 1 tablet (2 mg total) by mouth at bedtime.  . fexofenadine (ALLEGRA) 180 MG tablet Take 180 mg by mouth daily.   . fluticasone (FLONASE) 50 MCG/ACT nasal spray Place 2 sprays into the nose daily as needed for allergies. Spray twice daily as needed  . guaiFENesin (MUCINEX) 600 MG 12 hr tablet Take 1,200 mg by mouth 2 (two) times daily as needed for cough.   Marland Kitchen KEPPRA 750 MG tablet Take 1 tablet (750 mg total) by mouth 2 (two) times daily.  Marland Kitchen LEXAPRO 20 MG tablet Take 1 tablet (20 mg total) by mouth daily. Morning dosage  . meloxicam (MOBIC) 15 MG tablet 1 tablet  . mirtazapine (REMERON) 15 MG tablet Take 15 mg by mouth 3 times/day as needed-between meals & bedtime.   . simvastatin (ZOCOR) 10 MG tablet Take 10  mg by mouth at bedtime.  . triamterene-hydrochlorothiazide (MAXZIDE-25) 37.5-25 MG tablet Take 1 tablet by mouth daily.  Marland Kitchen umeclidinium-vilanterol (ANORO ELLIPTA) 62.5-25 MCG/INH AEPB Inhale 1 puff daily  . albuterol (PROVENTIL HFA;VENTOLIN HFA) 108 (90 Base) MCG/ACT inhaler Inhale 2 puffs into the lungs every 4 (four) hours as needed for wheezing or shortness of breath. (Patient not taking: Reported on 05/28/2016)  . [DISCONTINUED] Fluticasone-Umeclidin-Vilant (TRELEGY ELLIPTA) 100-62.5-25 MCG/INH AEPB Inhale 1 puff into  the lungs daily.   No facility-administered encounter medications on file as of 05/28/2016.     Allergy:  Allergies  Allergen Reactions  . Prozac [Fluoxetine Hcl] Other (See Comments)    seizures  . Sulfa Antibiotics Nausea And Vomiting and Other (See Comments)    TINGLING IN LEGS ALSO  . Micardis [Telmisartan] Swelling and Other (See Comments)    Swelling, bruising Swelling, bruising  . Prednisone Other (See Comments)    mood changes Mood changes  . Lisinopril-Hydrochlorothiazide Other (See Comments)    unknown  . Augmentin [Amoxicillin-Pot Clavulanate] Rash  . Avelox [Moxifloxacin Hcl In Nacl] Rash    LEVAQUIN IS OK-SEE NOTE  . Norvasc [Amlodipine Besylate] Other (See Comments)    fatigue    Social Hx:   Social History   Social History  . Marital status: Married    Spouse name: Beth [name]  . Number of children: 2  . Years of education: 12   Occupational History  . Not on file.   Social History Main Topics  . Smoking status: Former Smoker    Packs/day: 1.00    Years: 41.00    Quit date: 01/13/2006  . Smokeless tobacco: Never Used  . Alcohol use No  . Drug use: No  . Sexual activity: No   Other Topics Concern  . Not on file   Social History Narrative   Patient is married (Beth [name]) and lives at home with her husband.   Patient is retired.   Patient has two children.   Patient has a high school education.   Patient is right-handed.   Patient drinks 2 big mugs of coffee daily and sometimes tea in the evenings.    Past Surgical Hx:  Past Surgical History:  Procedure Laterality Date  . broken ankle Right   . CEREBRAL ANEURYSM REPAIR    . TONSILLECTOMY      Past Medical Hx:  Past Medical History:  Diagnosis Date  . BRCA2 positive 2017  . Diverticulosis   . Hearing loss   . HTN (hypertension)   . Memory loss   . Seizure disorder, complex partial, 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[place]) seizure free for one year  . Seizures ([place])   . Stroke, hemorrhagic ([place])  aneurysm bleed.   . Vestibulitis of ear     Oncology Hx:   No history exists.    Family Hx:  Family History  Problem Relation Age of Onset  . Dementia Mother   . Stroke Mother   . Thyroid disease Mother   . Osteoporosis Mother   . Ovarian cancer Paternal Grandmother 26  . Ovarian cancer Paternal Aunt 18  . Breast cancer Daughter 8       breast ca x2, bilateral dx. 38, 46--estrogen  . Other Daughter        no genetic testing as of 07/2015; has had ovaries removed  . Other Daughter        BRCA 2 Pos.  . Colon cancer Maternal Aunt        dx. unspecified age  .  Emphysema Maternal Uncle   . High blood pressure Maternal Grandmother   . Dementia Maternal Grandfather   . Bleeding Disorder Maternal Grandfather   . Emphysema Maternal Aunt   . Emphysema Maternal Aunt     Vitals:  Blood pressure 103/60, pulse 60, temperature 97.6 F (36.4 C), resp. rate (!) 22, height '5\' 4"'  (1.626 m), weight 244 lb 4.8 oz (110.8 kg), last menstrual period 01/14/1995.  Physical Exam: WN/WD, no acute distress, O2 by [place] on.  Neck: Supple, no lymphadenopathy, no thyromegaly.  Lungs: Clear to auscultation bilaterally, distant breath sounds.  Cardiovascular: Regular rate and rhythm.  Abdomen: Morbidly obese, soft, nontender, nondistended. There is no fluid wave or HSM, but exam is severely limited by habitus. Pannus with clean and intact skin underneath.  Groins: No lymphadenopathy.  Extremities: 1+ nonpitting edema equal bilaterally.  Pelvic Bimanual examination reveals palpably normal cervix. I cannot appreciate the uterine size or any adnexal masses, but exam is limited by body habitus.   Assessment/Plan: 76 year old with BRCA2 mutation who has a complicated medical history with COPD, BMI 42, s/p CVA with residual seizure disorder. She is referred for consideration of BSO which would be ideal but my concerns relate primarily to her ability to tolerate surgery and t-burg positioning for a  minimally invasive procedure. We will contact her pulmonary physician (Dr. Elsworth Soho) to see if repeat PFTs or possible evaluation is necessary prior to surgery. Again my concerns would include ability to tolerate positioning and the risk of not being able to extubate her post-op due to her underlying pulmonary disease. A laparotomy under regional anesthesia would be incredibly difficult due to her morbid obesity and would be at high risk for VTE and wound separation due to obesity and immobility. We will also need to speak with her neurologist (Dr. Brett Fairy) regarding the risk of T-burg positioning with her prior CVA.  I d/w the patient that due to her other medical issues it may be in some ways safer for her to proceed with surveillance even though all of the limitations of this as per Dr. Jana Hakim are completely accurate.  She is aware of the difficulty of her situation with her COPD and morbid obesity. She knows that I will reach back out to her once we hear back from her other physicians.  >1 hour face to face time was spent with the patient and her husband of which > 50% was in consultation.  Sevrin Sally A., MD 05/28/2016, 11:44 AM

## 2016-05-29 ENCOUNTER — Telehealth: Payer: Self-pay

## 2016-05-29 NOTE — Telephone Encounter (Signed)
-----  Message from Rigoberto Noel, MD sent at 05/29/2016 10:09 AM EDT ----- She has severe COPD on O2 & would be mod risk for pulmonary complications Prefer spinal anesthesia if possible  Janvi Ammar, pl arrange OV with TP/ me  to see if she in optimal condition  RA ----- Message ----- From: Dorothyann Gibbs, NP Sent: 05/28/2016   4:32 PM To: Rigoberto Noel, MD  Hello,   We saw [name] in [place] today for discussion of a bilateral salpingo-oophorectomy for BRCA 2. Dr. Alycia Rossetti, GYN [place], would like you input about clearance for surgery and perioperative risk from a pulmonology standpoint. She would also like your input about steps to optimize her lungs around her surgery if cleared. Thank you so much.   Sincerely   Joylene [name], NP for Dr. Nancy Marus  GYN Oncology  [phone]  Fax [phone]

## 2016-05-29 NOTE — Telephone Encounter (Signed)
Attempted to call patient, but the phone call dropped. Will attempt to call her again later.

## 2016-06-02 DIAGNOSIS — Z803 Family history of malignant neoplasm of breast: Secondary | ICD-10-CM | POA: Diagnosis not present

## 2016-06-02 DIAGNOSIS — Z1231 Encounter for screening mammogram for malignant neoplasm of breast: Secondary | ICD-10-CM | POA: Diagnosis not present

## 2016-06-02 NOTE — Progress Notes (Signed)
I agree with the assessment and plan .The patient is known to me. I would like for her to undergo the minimal invasive surgery and she could be in Trendelenburg positioned for 45 minutes. Her seizure medication - Keppra - can be converted to IV in a one mg to one mg conversion from oral dosing.   Shishir Krantz, MD

## 2016-06-03 ENCOUNTER — Other Ambulatory Visit: Payer: Self-pay | Admitting: Oncology

## 2016-06-05 ENCOUNTER — Telehealth: Payer: Self-pay | Admitting: Gynecologic Oncology

## 2016-06-05 ENCOUNTER — Encounter: Payer: Self-pay | Admitting: Obstetrics and Gynecology

## 2016-06-05 ENCOUNTER — Ambulatory Visit (INDEPENDENT_AMBULATORY_CARE_PROVIDER_SITE_OTHER): Payer: Medicare Other | Admitting: Obstetrics and Gynecology

## 2016-06-05 VITALS — BP 118/74 | HR 72 | Resp 16 | Ht 63.75 in | Wt 239.2 lb

## 2016-06-05 DIAGNOSIS — Z1501 Genetic susceptibility to malignant neoplasm of breast: Secondary | ICD-10-CM

## 2016-06-05 DIAGNOSIS — Z1509 Genetic susceptibility to other malignant neoplasm: Secondary | ICD-10-CM

## 2016-06-05 NOTE — Progress Notes (Signed)
76 y.o. G47P2002 Married Beth [name] here for annual exam.   Declines annual exam and wishes to discuss her BRCA2 positive status instead. She just had a clinical breast and pelvic exam.  BRCA 2 positive.   Saw Dr. Alycia Rossetti and is discussing potential surgical care with laparoscopic BSO versus observational management.  Saw Dr. Jana Hakim and he prescribed Arimidex.   Patient is worried about bone loss with this.  Last pelvic ultrasound was 03/27/16. Uterus with scattered myometrial calcifications.  EMS 3.17.  Sliver of fluid in endometrial canal. Left ovary seen with difficulty due to overlying gas.  Right ovary not seen due to bowel. No free fluid.  CA125 12 on 03/27/16.  EMB showed atrophic benign endometrium on 03/27/16.  Has COPD.   PCP:  Dr. Kathyrn Lass   Patient's last menstrual period was 01/14/1995 (approximate).           Sexually active: No.  The current method of family planning is post menopausal status.    Exercising: No.  The patient does not participate in regular exercise at present. Smoker:  Former   Health Maintenance: Pap:  05/31/15 Pap smear Negative History of abnormal Pap:  no MMG: 06/02/16 @ [facility] per patient.   Colonoscopy:  09/2014 normal [facility];does not need any further colonoscopy BMD:   2016  Result  [facility].  Normal.   Does have hx of fracture of her right ankle due to a fall from a seizure. TDaP:  PCP Gardasil:   N/A HIV: PCP Hep C: PCP Screening Labs: PCP   reports that she quit smoking about 10 years ago. She has a 41.00 pack-year smoking history. She has never used smokeless tobacco. She reports that she does not drink alcohol or use drugs.  Past Medical History:  Diagnosis Date  . BRCA2 positive 2017  . Diverticulosis   . Hearing loss   . HTN (hypertension)   . Memory loss   . Seizure disorder, complex partial, 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[place]) seizure free for one year  . Seizures ([place])   . 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   . Vestibulitis of ear     Past Surgical History:  Procedure Laterality Date  . broken ankle Right   . CEREBRAL ANEURYSM REPAIR    . TONSILLECTOMY      Current Outpatient Prescriptions  Medication Sig Dispense Refill  . albuterol (PROVENTIL HFA;VENTOLIN HFA) 108 (90 Base) MCG/ACT inhaler Inhale 2 puffs into the lungs every 4 (four) hours as needed for wheezing or shortness of breath. 1 Inhaler 5  . aspirin EC 81 MG tablet Take 81 mg by mouth daily.    Marland Kitchen atenolol (TENORMIN) 50 MG tablet TAKE 1/2 TABLET BY MOUTH TWICE A DAY    . captopril (CAPOTEN) 50 MG tablet Take 50 mg by mouth 2 (two) times daily.    . Cholecalciferol (VITAMIN D3) 2000 units capsule 1 tablet by mouth daily    . clonazePAM (KLONOPIN) 2 MG tablet Take 1 tablet (2 mg total) by mouth at bedtime. 90 tablet 1  . fexofenadine (ALLEGRA) 180 MG tablet Take 180 mg by mouth daily.     . fluticasone (FLONASE) 50 MCG/ACT nasal spray Place 2 sprays into the nose daily as needed for allergies. Spray twice daily as needed    . guaiFENesin (MUCINEX) 600 MG 12 hr tablet Take 1,200 mg by mouth 2 (two) times daily as needed for cough.     Marland Kitchen KEPPRA 750 MG tablet Take 1 tablet (  750 mg total) by mouth 2 (two) times daily. 180 tablet 3  . LEXAPRO 20 MG tablet Take 1 tablet (20 mg total) by mouth daily. Morning dosage 90 tablet 3  . meloxicam (MOBIC) 15 MG tablet 1 tablet    . simvastatin (ZOCOR) 10 MG tablet Take 10 mg by mouth at bedtime.    . triamterene-hydrochlorothiazide (MAXZIDE-25) 37.5-25 MG tablet Take 1 tablet by mouth daily.    Marland Kitchen umeclidinium-vilanterol (ANORO ELLIPTA) 62.5-25 MCG/INH AEPB Inhale 1 puff daily     No current facility-administered medications for this visit.     Family History  Problem Relation Age of Onset  . Dementia Mother   . Stroke Mother   . Thyroid disease Mother   . Osteoporosis Mother   . Ovarian cancer Paternal Grandmother 39  . Ovarian cancer Paternal Aunt 61  . Breast cancer Daughter 40        breast ca x2, bilateral dx. 38, 46--estrogen  . Other Daughter        no genetic testing as of 07/2015; has had ovaries removed  . Other Daughter        BRCA 2 Pos.  . Colon cancer Maternal Aunt        dx. unspecified age  . Emphysema Maternal Uncle   . High blood pressure Maternal Grandmother   . Dementia Maternal Grandfather   . Bleeding Disorder Maternal Grandfather   . Emphysema Maternal Aunt   . Emphysema Maternal Aunt     ROS:  Pertinent items are noted in HPI.  Otherwise, a comprehensive ROS was negative.  Exam:   BP 118/74 (BP Location: Right Arm, Patient Position: Sitting, Cuff Size: Normal)   Pulse 72   Resp 16   Ht 5' 3.75" (1.619 m)   Wt 239 lb 3.2 oz (108.5 kg)   LMP 01/14/1995 (Approximate)   BMI 41.38 kg/m     General appearance: alert, cooperative and appears stated age   Assessment:   BRCA2 positive status.  COPD.  On O2.  Seizure disorder.  Hx hemorrhagic stroke.  Plan:  We talked about her increased risk of breast and ovarian cancer and about her medical issues which increase her risk with surgical care. We reviewed Up to Date together and have summarized her cancer screening plan if she does not have surgery: - Breast exam every 6 - 12 months in office.  - Mammogram and breast MRI yearly. - TV ultrasound and CA125 every 6 months.  - Due for pelvic ultrasound in September 2018.  She requests to do this here in this office.  She declines this in a hospital or radiology center. She will follow up with Dr. Jana Hakim and Dr. Alycia Rossetti per their recommendations.  ___25____ minutes face to face time of which over 50% was spent in counseling.   After visit summary provided.

## 2016-06-05 NOTE — Telephone Encounter (Signed)
LM for patient to call me back. PG

## 2016-06-06 ENCOUNTER — Ambulatory Visit: Payer: Medicare Other | Admitting: Adult Health

## 2016-06-10 ENCOUNTER — Telehealth: Payer: Self-pay | Admitting: Gynecologic Oncology

## 2016-06-12 ENCOUNTER — Telehealth: Payer: Self-pay

## 2016-06-12 ENCOUNTER — Encounter: Payer: Self-pay | Admitting: Obstetrics and Gynecology

## 2016-06-12 NOTE — Telephone Encounter (Signed)
Returned call to patient.  Advised her that I would send Dr. Alycia Rossetti a message asking her to please contact the patient when she returns to the office.  Patient agreeable with the plan.

## 2016-06-12 NOTE — Telephone Encounter (Signed)
Returning pt vm regarding requesting to schedule MRI breast. Called pt back to discuss but unable to get in touch with her. No vm set up to leave message and number.

## 2016-06-16 ENCOUNTER — Telehealth: Payer: Self-pay | Admitting: *Deleted

## 2016-06-16 NOTE — Telephone Encounter (Signed)
"  I need to have an MRI but the company said they need my mammogram results before they can schedule.  I have now had the mammogram but do not know who called me or their phone number."  Provided GI at [address] [place]. [phone] for her to call.  Results of mammogram are present in EPIC at this time.Marland Kitchen

## 2016-06-17 ENCOUNTER — Other Ambulatory Visit: Payer: Self-pay | Admitting: Gynecologic Oncology

## 2016-06-17 ENCOUNTER — Telehealth: Payer: Self-pay | Admitting: Gynecologic Oncology

## 2016-06-17 DIAGNOSIS — Z1501 Genetic susceptibility to malignant neoplasm of breast: Secondary | ICD-10-CM

## 2016-06-17 DIAGNOSIS — R102 Pelvic and perineal pain: Secondary | ICD-10-CM

## 2016-06-17 DIAGNOSIS — Z1509 Genetic susceptibility to other malignant neoplasm: Secondary | ICD-10-CM

## 2016-06-17 DIAGNOSIS — Z1502 Genetic susceptibility to malignant neoplasm of ovary: Secondary | ICD-10-CM

## 2016-06-17 NOTE — Telephone Encounter (Signed)
TC to patient. We discussed recommendations against surgery due to limitations due to her pulmonary disease. Spinal is not an option due to her morbid obesity and we discussed the inability to be able to do this safely due to her habitus and inability to paralyze with a spinal. She would like screening at the cancer center and we will arrange that for her. She would like for me to speak with her daughter. We will contact her with her ultrasound and clinic visit with CA-125. PG

## 2016-06-24 NOTE — Progress Notes (Signed)
Has severe COPD on O2 Moderate - high risk for post op pulmonary complications with general anesthesia & pelvic surgery.

## 2016-07-09 ENCOUNTER — Other Ambulatory Visit: Payer: Self-pay | Admitting: Gynecologic Oncology

## 2016-07-09 DIAGNOSIS — R1909 Other intra-abdominal and pelvic swelling, mass and lump: Secondary | ICD-10-CM

## 2016-07-09 DIAGNOSIS — R1900 Intra-abdominal and pelvic swelling, mass and lump, unspecified site: Secondary | ICD-10-CM

## 2016-07-25 DIAGNOSIS — Z1501 Genetic susceptibility to malignant neoplasm of breast: Secondary | ICD-10-CM | POA: Diagnosis not present

## 2016-07-25 DIAGNOSIS — E6609 Other obesity due to excess calories: Secondary | ICD-10-CM | POA: Diagnosis not present

## 2016-07-25 DIAGNOSIS — Z6841 Body Mass Index (BMI) 40.0 and over, adult: Secondary | ICD-10-CM | POA: Diagnosis not present

## 2016-07-25 DIAGNOSIS — R7301 Impaired fasting glucose: Secondary | ICD-10-CM | POA: Diagnosis not present

## 2016-07-25 DIAGNOSIS — Z1509 Genetic susceptibility to other malignant neoplasm: Secondary | ICD-10-CM | POA: Diagnosis not present

## 2016-07-25 DIAGNOSIS — F329 Major depressive disorder, single episode, unspecified: Secondary | ICD-10-CM | POA: Diagnosis not present

## 2016-07-26 ENCOUNTER — Encounter: Payer: Self-pay | Admitting: Gynecologic Oncology

## 2016-08-07 ENCOUNTER — Ambulatory Visit (INDEPENDENT_AMBULATORY_CARE_PROVIDER_SITE_OTHER): Payer: Medicare Other | Admitting: Adult Health

## 2016-08-07 ENCOUNTER — Encounter: Payer: Self-pay | Admitting: Adult Health

## 2016-08-07 DIAGNOSIS — J9611 Chronic respiratory failure with hypoxia: Secondary | ICD-10-CM | POA: Diagnosis not present

## 2016-08-07 DIAGNOSIS — J449 Chronic obstructive pulmonary disease, unspecified: Secondary | ICD-10-CM

## 2016-08-07 NOTE — Assessment & Plan Note (Signed)
Compensated on present regimen   Plan  Patient Instructions  Continue on ANORO daily , rinse after use.  Continue on oxygen 2l/m  Follow up with Dr. Elsworth Soho  In 4 months as planned and As needed

## 2016-08-07 NOTE — Progress Notes (Signed)
'@Patient'  ID: Beth [name], female    DOB: May 16, 1940, 76 y.o.   MRN: [id number]  Chief Complaint  Patient presents with  . Follow-up    COPD    Referring provider: Kathyrn Lass, MD  HPI: 76 year old female former smoker followed for COPD  Significant tests/ events  CT angio chest 07/2015 >>no pulmonary embolism, no emphysema  2010 head CT >>polyps or mucous retention cysts seen within the maxillary sinuses bilaterally  PFTs 09/2015>>severe airway obstruction with FEV1 40%, improves to 47% with albuterol,DLCO decreased to 45% Finished [facility] Spring 2018 .    08/07/2016 Follow up  : COPD  Patient presents for a four-month follow-up for COPD. Patient was tried on TRELEGY last ov but prefers ANORO . Admits she does not use it all the times. Feels it works good . Denies flare of cough or wheezing .  PVX and Prevnar are utd.  Denies chest pain , orthopnea , edema or fever.     Allergies  Allergen Reactions  . Prozac [Fluoxetine Hcl] Other (See Comments)    seizures  . Sulfa Antibiotics Nausea And Vomiting and Other (See Comments)    TINGLING IN LEGS ALSO  . Micardis [Telmisartan] Swelling and Other (See Comments)    Swelling, bruising Swelling, bruising  . Prednisone Other (See Comments)    mood changes Mood changes  . Lisinopril-Hydrochlorothiazide Other (See Comments)    unknown  . Augmentin [Amoxicillin-Pot Clavulanate] Rash  . Avelox [Moxifloxacin Hcl In Nacl] Rash    LEVAQUIN IS OK-SEE NOTE  . Norvasc [Amlodipine Besylate] Other (See Comments)    fatigue    Immunization History  Administered Date(s) Administered  . Influenza-Unspecified 10/08/2015    Past Medical History:  Diagnosis Date  . BRCA2 positive 2017  . Diverticulosis   . Hearing loss   . HTN (hypertension)   . Memory loss   . Seizure disorder, complex partial, 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[place]) seizure free for one year  . Seizures ([place])   . 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    . Vestibulitis of ear     Tobacco History: History  Smoking Status  . Former Smoker  . Packs/day: 1.00  . Years: 41.00  . Quit date: 01/13/2006  Smokeless Tobacco  . Never Used   Counseling given: Not Answered   Outpatient Encounter Prescriptions as of 08/07/2016  Medication Sig  . albuterol (PROVENTIL HFA;VENTOLIN HFA) 108 (90 Base) MCG/ACT inhaler Inhale 2 puffs into the lungs every 4 (four) hours as needed for wheezing or shortness of breath.  Marland Kitchen aspirin EC 81 MG tablet Take 81 mg by mouth daily.  Marland Kitchen atenolol (TENORMIN) 50 MG tablet TAKE 1/2 TABLET BY MOUTH TWICE A DAY  . captopril (CAPOTEN) 50 MG tablet Take 50 mg by mouth 2 (two) times daily.  . Cholecalciferol (VITAMIN D3) 2000 units capsule 1 tablet by mouth daily  . clonazePAM (KLONOPIN) 2 MG tablet Take 1 tablet (2 mg total) by mouth at bedtime.  . fexofenadine (ALLEGRA) 180 MG tablet Take 180 mg by mouth daily.   . fluticasone (FLONASE) 50 MCG/ACT nasal spray Place 2 sprays into the nose daily as needed for allergies. Spray twice daily as needed  . guaiFENesin (MUCINEX) 600 MG 12 hr tablet Take 1,200 mg by mouth 2 (two) times daily as needed for cough.   Marland Kitchen KEPPRA 750 MG tablet Take 1 tablet (750 mg total) by mouth 2 (two) times daily.  Marland Kitchen LEXAPRO 20 MG tablet Take 1 tablet (20 mg total)  by mouth daily. Morning dosage  . meloxicam (MOBIC) 15 MG tablet 1 tablet  . simvastatin (ZOCOR) 10 MG tablet Take 10 mg by mouth at bedtime.  . triamterene-hydrochlorothiazide (MAXZIDE-25) 37.5-25 MG tablet Take 1 tablet by mouth daily.  Marland Kitchen umeclidinium-vilanterol (ANORO ELLIPTA) 62.5-25 MCG/INH AEPB Inhale 1 puff daily   No facility-administered encounter medications on file as of 08/07/2016.      Review of Systems  Constitutional:   No  weight loss, night sweats,  Fevers, chills, + fatigue, or  lassitude.  HEENT:   No headaches,  Difficulty swallowing,  Tooth/dental problems, or  Sore throat,                No sneezing, itching, ear  ache, nasal congestion, post nasal drip,   CV:  No chest pain,  Orthopnea, PND, swelling in lower extremities, anasarca, dizziness, palpitations, syncope.   GI  No heartburn, indigestion, abdominal pain, nausea, vomiting, diarrhea, change in bowel habits, loss of appetite, bloody stools.   Resp: No shortness of breath with exertion or at rest.  No excess mucus, no productive cough,  No non-productive cough,  No coughing up of blood.  No change in color of mucus.  No wheezing.  No chest wall deformity  Skin: no rash or lesions.  GU: no dysuria, change in color of urine, no urgency or frequency.  No flank pain, no hematuria   MS:  No joint pain or swelling.  No decreased range of motion.  No back pain.    Physical Exam  BP 116/64 (BP Location: Left Arm, Cuff Size: Large)   Pulse 65   Ht 5' 3.25" (1.607 m)   Wt 231 lb 3.2 oz (104.9 kg)   LMP 01/14/1995 (Approximate)   SpO2 95%   BMI 40.63 kg/m   GEN: A/Ox3; pleasant , NAD, obese    HEENT:  [place]/AT,  EACs-clear, TMs-wnl, NOSE-clear, THROAT-clear, no lesions, no postnasal drip or exudate noted.   NECK:  Supple w/ fair ROM; no JVD; normal carotid impulses w/o bruits; no thyromegaly or nodules palpated; no lymphadenopathy.    RESP  Decreased BS In bases , no accessory muscle use, no dullness to percussion  CARD:  RRR, no m/r/g, no peripheral edema, pulses intact, no cyanosis or clubbing.  GI:   Soft & nt; nml bowel sounds; no organomegaly or masses detected.   Musco: Warm bil, no deformities or joint swelling noted.   Neuro: alert, no focal deficits noted.    Skin: Warm, no lesions or rashes   Lab Results:  CBC  ProBNP No results found for: PROBNP  Imaging: No results found.   Assessment & Plan:   COPD without exacerbation ([place]) Compensated on present regimen   Plan  Patient Instructions  Continue on ANORO daily , rinse after use.  Continue on oxygen 2l/m  Follow up with Dr. Elsworth Soho  In 4 months as planned and As  needed         Chronic respiratory failure ([place]) Cont on O2      Tammy Parrett, NP 08/07/2016

## 2016-08-07 NOTE — Patient Instructions (Addendum)
Continue on ANORO daily , rinse after use.  Continue on oxygen 2l/m  Follow up with Dr. Elsworth Soho  In 4 months as planned and As needed

## 2016-08-07 NOTE — Assessment & Plan Note (Signed)
Cont on O2 .  

## 2016-09-03 ENCOUNTER — Telehealth: Payer: Self-pay | Admitting: Obstetrics and Gynecology

## 2016-09-03 NOTE — Telephone Encounter (Signed)
Patient returning your call.

## 2016-09-03 NOTE — Telephone Encounter (Signed)
I confirmed that the [place] department at the [facility] has ordered this test.  You may close this referral. Thanks.

## 2016-09-03 NOTE — Telephone Encounter (Signed)
Placed call to patient to schedule follow up ultrasound. Left voicemail message requesting a return call.    cc: Dr Quincy Simmonds

## 2016-09-03 NOTE — Telephone Encounter (Signed)
Returned call to patient to schedule recommended follow up ultrasound. Patient has advised the ultrasound has been scheduled with [facility] on 09/17/16.  Routing to Dr Quincy Simmonds for final review

## 2016-09-03 NOTE — Telephone Encounter (Signed)
Referral has been closed. Ok to close [employer]

## 2016-09-17 ENCOUNTER — Ambulatory Visit (HOSPITAL_COMMUNITY)
Admission: RE | Admit: 2016-09-17 | Discharge: 2016-09-17 | Disposition: A | Discharge status: Home / Self Care | Payer: Medicare Other | Source: Ambulatory Visit | Attending: Gynecologic Oncology | Admitting: Gynecologic Oncology

## 2016-09-17 ENCOUNTER — Other Ambulatory Visit: Payer: Medicare Other

## 2016-09-17 DIAGNOSIS — Z1502 Genetic susceptibility to malignant neoplasm of ovary: Secondary | ICD-10-CM

## 2016-09-17 DIAGNOSIS — R102 Pelvic and perineal pain: Secondary | ICD-10-CM | POA: Diagnosis not present

## 2016-09-17 DIAGNOSIS — N859 Noninflammatory disorder of uterus, unspecified: Secondary | ICD-10-CM | POA: Diagnosis not present

## 2016-09-17 DIAGNOSIS — Z1509 Genetic susceptibility to other malignant neoplasm: Secondary | ICD-10-CM | POA: Diagnosis not present

## 2016-09-17 DIAGNOSIS — Z1501 Genetic susceptibility to malignant neoplasm of breast: Secondary | ICD-10-CM | POA: Diagnosis not present

## 2016-09-17 DIAGNOSIS — R1909 Other intra-abdominal and pelvic swelling, mass and lump: Secondary | ICD-10-CM | POA: Diagnosis not present

## 2016-09-18 LAB — CA 125: Cancer Antigen (CA) 125: 15.5 U/mL (ref 0.0–38.1)

## 2016-09-22 ENCOUNTER — Ambulatory Visit (INDEPENDENT_AMBULATORY_CARE_PROVIDER_SITE_OTHER): Payer: Medicare Other | Admitting: Adult Health

## 2016-09-22 ENCOUNTER — Encounter: Payer: Self-pay | Admitting: Adult Health

## 2016-09-22 ENCOUNTER — Telehealth: Payer: Self-pay | Admitting: Adult Health

## 2016-09-22 VITALS — BP 104/64 | HR 68 | Ht 64.0 in | Wt 231.8 lb

## 2016-09-22 DIAGNOSIS — J449 Chronic obstructive pulmonary disease, unspecified: Secondary | ICD-10-CM | POA: Diagnosis not present

## 2016-09-22 DIAGNOSIS — J9611 Chronic respiratory failure with hypoxia: Secondary | ICD-10-CM | POA: Diagnosis not present

## 2016-09-22 MED ORDER — UMECLIDINIUM-VILANTEROL 62.5-25 MCG/INH IN AEPB: INHALATION_SPRAY | RESPIRATORY_TRACT | 5 refills | Status: DC

## 2016-09-22 NOTE — Patient Instructions (Addendum)
Continue on ANORO daily , rinse after use.  Continue on oxygen 2l/m  Order sent for POC oxygen device.  Follow up with Dr. Elsworth Soho  In 4 -6 months as planned and As needed

## 2016-09-22 NOTE — Telephone Encounter (Signed)
Called and spoke with pt's spouse Jenny Reichmann. Beth [name] states pt has 6 Rx of Anoro on hand. It appears during today's visit with TP Anoro was sent to [facility]. Pt has declined Rx. I have called [facility] and  Requested Rx for Anoro be d/c per Beth [name]'s request.   Nothing further needed.

## 2016-09-22 NOTE — Assessment & Plan Note (Signed)
Cont on O2  POC order

## 2016-09-22 NOTE — Progress Notes (Signed)
_0  ID: Beth [name], female    DOB: 02-18-1940, 76 y.o.   MRN: [id number]  Chief Complaint  Patient presents with  . Follow-up    COPD    Referring provider: Kathyrn Lass, MD  HPI: 76 year old female former smoker followed for  Severe COPD and O2 RF   Significant tests/ events  CT angio chest 07/2015 >>no pulmonary embolism, no emphysema  2010 head CT >>polyps or mucous retention cysts seen within the maxillary sinuses bilaterally  PFTs 09/2015>>severe airway obstruction with FEV1 40%, improves to 47% with albuterol,DLCO decreased to 45% Finished [facility] 2018 .    09/22/2016 Follow up : COPD /O2 RF  Patient returns for a two-month follow-up for COPD . Remains on ANORO daily .  Denies flare of cough or wheezing . Feels breathing is stable. Denies cough or increased dyspnea.  Declines flu shot . Wants to get at PCP .   She remains on O2 2l/m . O2 sats on room air walking was 84%, O2 2l/m 95%  Feels it helps her. She would like to get a portable oxygen device.to help her be more active.      Allergies  Allergen Reactions  . Prozac [Fluoxetine Hcl] Other (See Comments)    seizures  . Sulfa Antibiotics Nausea And Vomiting and Other (See Comments)    TINGLING IN LEGS ALSO  . Micardis [Telmisartan] Swelling and Other (See Comments)    Swelling, bruising Swelling, bruising  . Prednisone Other (See Comments)    mood changes Mood changes  . Lisinopril-Hydrochlorothiazide Other (See Comments)    unknown  . Augmentin [Amoxicillin-Pot Clavulanate] Rash  . Avelox [Moxifloxacin Hcl In Nacl] Rash    LEVAQUIN IS OK-SEE NOTE  . Norvasc [Amlodipine Besylate] Other (See Comments)    fatigue    Immunization History  Administered Date(s) Administered  . Influenza-Unspecified 10/08/2015    Past Medical History:  Diagnosis Date  . BRCA2 positive 2017  . Diverticulosis   . Hearing loss   . HTN (hypertension)   . Memory loss   . Seizure disorder,  complex partial, 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[place]) seizure free for one year  . Seizures ([place])   . 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   . Vestibulitis of ear     Tobacco History: History  Smoking Status  . Former Smoker  . Packs/day: 1.00  . Years: 41.00  . Quit date: 01/13/2006  Smokeless Tobacco  . Never Used   Counseling given: Not Answered   Outpatient Encounter Prescriptions as of 09/22/2016  Medication Sig  . albuterol (PROVENTIL HFA;VENTOLIN HFA) 108 (90 Base) MCG/ACT inhaler Inhale 2 puffs into the lungs every 4 (four) hours as needed for wheezing or shortness of breath.  Marland Kitchen aspirin EC 81 MG tablet Take 81 mg by mouth daily.  Marland Kitchen atenolol (TENORMIN) 50 MG tablet TAKE 1/2 TABLET BY MOUTH TWICE A DAY  . captopril (CAPOTEN) 50 MG tablet Take 50 mg by mouth 2 (two) times daily.  . Cholecalciferol (VITAMIN D3) 2000 units capsule 1 tablet by mouth daily  . clonazePAM (KLONOPIN) 2 MG tablet Take 1 tablet (2 mg total) by mouth at bedtime.  . fexofenadine (ALLEGRA) 180 MG tablet Take 180 mg by mouth daily as needed.   . fluticasone (FLONASE) 50 MCG/ACT nasal spray Place 2 sprays into the nose daily as needed for allergies. Spray twice daily as needed  . guaiFENesin (MUCINEX) 600 MG 12 hr tablet Take 1,200 mg by mouth 2 (two) times daily as  needed for cough.   Marland Kitchen KEPPRA 750 MG tablet Take 1 tablet (750 mg total) by mouth 2 (two) times daily.  Marland Kitchen LEXAPRO 20 MG tablet Take 1 tablet (20 mg total) by mouth daily. Morning dosage  . meloxicam (MOBIC) 15 MG tablet 1 tablet  . simvastatin (ZOCOR) 10 MG tablet Take 10 mg by mouth at bedtime.  . triamterene-hydrochlorothiazide (MAXZIDE-25) 37.5-25 MG tablet Take 1 tablet by mouth daily.  Marland Kitchen umeclidinium-vilanterol (ANORO ELLIPTA) 62.5-25 MCG/INH AEPB Inhale 1 puff daily  . [DISCONTINUED] umeclidinium-vilanterol (ANORO ELLIPTA) 62.5-25 MCG/INH AEPB Inhale 1 puff daily   No facility-administered encounter medications on file as of 09/22/2016.       Review of Systems  Constitutional:   No  weight loss, night sweats,  Fevers, chills, fatigue, or  lassitude.  HEENT:   No headaches,  Difficulty swallowing,  Tooth/dental problems, or  Sore throat,                No sneezing, itching, ear ache,  +nasal congestion, post nasal drip,   CV:  No chest pain,  Orthopnea, PND, swelling in lower extremities, anasarca, dizziness, palpitations, syncope.   GI  No heartburn, indigestion, abdominal pain, nausea, vomiting, diarrhea, change in bowel habits, loss of appetite, bloody stools.   Resp:    No chest wall deformity  Skin: no rash or lesions.  GU: no dysuria, change in color of urine, no urgency or frequency.  No flank pain, no hematuria   MS:  No joint pain or swelling.  No decreased range of motion.  No back pain.    Physical Exam  BP 104/64 (BP Location: Left Arm, Cuff Size: Normal)   Pulse 68   Ht _0  (1.626 m)   Wt 231 lb 12.8 oz (105.1 kg)   LMP 01/14/1995 (Approximate)   SpO2 97%   BMI 39.79 kg/m   GEN: A/Ox3; pleasant , NAD, obese    HEENT:  [place]/AT,  EACs-clear, TMs-wnl, NOSE-clear, THROAT-clear, no lesions, no postnasal drip or exudate noted.   NECK:  Supple w/ fair ROM; no JVD; normal carotid impulses w/o bruits; no thyromegaly or nodules palpated; no lymphadenopathy.    RESP  Clear  P & A; w/o, wheezes/ rales/ or rhonchi. no accessory muscle use, no dullness to percussion  CARD:  RRR, no m/r/g, no peripheral edema, pulses intact, no cyanosis or clubbing.  GI:   Soft & nt; nml bowel sounds; no organomegaly or masses detected.   Musco: Warm bil, no deformities or joint swelling noted.   Neuro: alert, no focal deficits noted.    Skin: Warm, no lesions or rashes    Lab Results:  CBC    ProBNP No results found for: PROBNP  Imaging: US Transvaginal Non-ob  Result Date: 09/17/2016 CLINICAL DATA:  Initial evaluation of ovaries. History of BRCA 2 gene. EXAM: TRANSABDOMINAL AND TRANSVAGINAL ULTRASOUND  OF PELVIS TECHNIQUE: Both transabdominal and transvaginal ultrasound examinations of the pelvis were performed. Transabdominal technique was performed for global imaging of the pelvis including uterus, ovaries, adnexal regions, and pelvic cul-de-sac. It was necessary to proceed with endovaginal exam following the transabdominal exam to visualize the pelvic visceral. COMPARISON:  Prior ultrasound from 09/06/2015. FINDINGS: Uterus Measurements: 5.2 x 2.0 x 3.5 cm. 5 x 4 x 6 mm intramural hyper echoic focus may reflect a small calcification or possibly tiny calcified fibroid. No other mass lesion. Endometrium Thickness: 4.6 mm.  No focal abnormality visualized. Right ovary Not visualized.  No adnexal mass. Left  ovary Not visualized.  No adnexal mass. Other findings No abnormal free fluid. IMPRESSION: 1. Nonvisualization of the ovaries. No adnexal mass or abnormal free fluid identified. 2. 6 mm intramural hyperechoic focus within the uterus, which may reflect a small calcification or possibly tiny calcified fibroid. Otherwise normal sonographic appearance of the uterus and endometrium. Electronically Signed   By: Jeannine Boga M.D.   On: 09/17/2016 15:16   US Pelvis Complete  Result Date: 09/17/2016 CLINICAL DATA:  Initial evaluation of ovaries. History of BRCA 2 gene. EXAM: TRANSABDOMINAL AND TRANSVAGINAL ULTRASOUND OF PELVIS TECHNIQUE: Both transabdominal and transvaginal ultrasound examinations of the pelvis were performed. Transabdominal technique was performed for global imaging of the pelvis including uterus, ovaries, adnexal regions, and pelvic cul-de-sac. It was necessary to proceed with endovaginal exam following the transabdominal exam to visualize the pelvic visceral. COMPARISON:  Prior ultrasound from 09/06/2015. FINDINGS: Uterus Measurements: 5.2 x 2.0 x 3.5 cm. 5 x 4 x 6 mm intramural hyper echoic focus may reflect a small calcification or possibly tiny calcified fibroid. No other mass lesion.  Endometrium Thickness: 4.6 mm.  No focal abnormality visualized. Right ovary Not visualized.  No adnexal mass. Left ovary Not visualized.  No adnexal mass. Other findings No abnormal free fluid. IMPRESSION: 1. Nonvisualization of the ovaries. No adnexal mass or abnormal free fluid identified. 2. 6 mm intramural hyperechoic focus within the uterus, which may reflect a small calcification or possibly tiny calcified fibroid. Otherwise normal sonographic appearance of the uterus and endometrium. Electronically Signed   By: Jeannine Boga M.D.   On: 09/17/2016 15:16     Assessment & Plan:   COPD without exacerbation ([place]) Stable without flare   Plan  Patient Instructions  Continue on ANORO daily , rinse after use.  Continue on oxygen 2l/m  Order sent for POC oxygen device.  Follow up with Dr. Elsworth Soho  In 4 -6 months as planned and As needed        Chronic respiratory failure ([place]) Cont on O2  POC order       Rexene Edison, NP 09/22/2016

## 2016-09-22 NOTE — Assessment & Plan Note (Signed)
Stable without flare   Plan  Patient Instructions  Continue on ANORO daily , rinse after use.  Continue on oxygen 2l/m  Order sent for POC oxygen device.  Follow up with Dr. Elsworth Soho  In 4 -6 months as planned and As needed

## 2016-09-24 ENCOUNTER — Encounter: Payer: Self-pay | Admitting: Gynecologic Oncology

## 2016-09-24 ENCOUNTER — Ambulatory Visit: Payer: Medicare Other | Attending: Gynecologic Oncology | Admitting: Gynecologic Oncology

## 2016-09-24 VITALS — BP 105/51 | HR 50 | Temp 97.7°F | Resp 22 | Wt 231.4 lb

## 2016-09-24 DIAGNOSIS — Z1509 Genetic susceptibility to other malignant neoplasm: Secondary | ICD-10-CM | POA: Insufficient documentation

## 2016-09-24 DIAGNOSIS — G40909 Epilepsy, unspecified, not intractable, without status epilepticus: Secondary | ICD-10-CM | POA: Insufficient documentation

## 2016-09-24 DIAGNOSIS — Z1501 Genetic susceptibility to malignant neoplasm of breast: Secondary | ICD-10-CM | POA: Diagnosis not present

## 2016-09-24 DIAGNOSIS — I10 Essential (primary) hypertension: Secondary | ICD-10-CM | POA: Insufficient documentation

## 2016-09-24 DIAGNOSIS — K5900 Constipation, unspecified: Secondary | ICD-10-CM | POA: Diagnosis not present

## 2016-09-24 DIAGNOSIS — Z6841 Body Mass Index (BMI) 40.0 and over, adult: Secondary | ICD-10-CM

## 2016-09-24 DIAGNOSIS — R1030 Lower abdominal pain, unspecified: Secondary | ICD-10-CM

## 2016-09-24 DIAGNOSIS — I69398 Other sequelae of cerebral infarction: Secondary | ICD-10-CM | POA: Insufficient documentation

## 2016-09-24 DIAGNOSIS — Z8041 Family history of malignant neoplasm of ovary: Secondary | ICD-10-CM

## 2016-09-24 DIAGNOSIS — Z7982 Long term (current) use of aspirin: Secondary | ICD-10-CM | POA: Diagnosis not present

## 2016-09-24 DIAGNOSIS — Z1502 Genetic susceptibility to malignant neoplasm of ovary: Secondary | ICD-10-CM | POA: Diagnosis not present

## 2016-09-24 DIAGNOSIS — Z803 Family history of malignant neoplasm of breast: Secondary | ICD-10-CM

## 2016-09-24 DIAGNOSIS — R1909 Other intra-abdominal and pelvic swelling, mass and lump: Secondary | ICD-10-CM

## 2016-09-24 DIAGNOSIS — Z87891 Personal history of nicotine dependence: Secondary | ICD-10-CM | POA: Diagnosis not present

## 2016-09-24 DIAGNOSIS — M25559 Pain in unspecified hip: Secondary | ICD-10-CM

## 2016-09-24 DIAGNOSIS — Z9981 Dependence on supplemental oxygen: Secondary | ICD-10-CM | POA: Diagnosis not present

## 2016-09-24 DIAGNOSIS — J449 Chronic obstructive pulmonary disease, unspecified: Secondary | ICD-10-CM | POA: Diagnosis not present

## 2016-09-24 DIAGNOSIS — Z79899 Other long term (current) drug therapy: Secondary | ICD-10-CM | POA: Insufficient documentation

## 2016-09-24 NOTE — Patient Instructions (Signed)
Plan on having an ultrasound and CA 125 in six months.  Please call us for any questions or concerns.  Please give our office a call at [phone] in November or December 2018 to schedule your appointment.  Please call for any new symptoms or concerns.

## 2016-09-26 ENCOUNTER — Encounter: Payer: Self-pay | Admitting: Gynecologic Oncology

## 2016-09-26 NOTE — Progress Notes (Signed)
Follow Up Note: Gyn-Onc  Beth [name] 76 y.o. female  CC:  Chief Complaint  Patient presents with  . BRCA positive    follow up    HPI:  Beth [name] is a 76 year old female initially seen in consultation for BRCA 2 gene positivity at the request of Dr. Jana Hakim. She opted for testing for BRCA mutation due to her strong family history of breast and ovarian cancer.  Of note, her daughter was found to be positive after being diagnosed with breast cancer.  Per history, her paternal grandmother had ovarian cancer age of 77 and her paternal aunt had ovarian cancer at the age of 50.  Past medical history includes COPD with being oxygen dependent 24 hours a day. She has also had a stroke which has caused her to have a seizure disorder which is fairly well managed on Kepra. She suffers from fairly significant short term memory loss per her report. She took OCPs for about 10-15 years total. She went through menopause in her late 51s and took HRT briefly but stopped with results from the nurses health study. She has never had any abdominal surgery. She had been having ultrasounds and CA-125s done with Dr. Quincy Simmonds.  She was initially seen by Dr. Alycia Rossetti for consideration of risk reducing bilateral salpingo-oophorectomy but it was determined that surgery was too risky after pulmonary and neurology input.  It was determined that surveillance ultrasounds and CA 125 levels performed every 6 months would be safer for the patient.  Ultrasound on 09/17/16 resulted: IMPRESSION: 1. Nonvisualization of the ovaries. No adnexal mass or abnormal free fluid identified. 2. 6 mm intramural hyperechoic focus within the uterus, which may reflect a small calcification or possibly tiny calcified fibroid. Otherwise normal sonographic appearance of the uterus and endometrium. CA 125 on 09/17/16 was 15.5.  Interval History:  She presents today for follow up with her daughter.  She reports doing well since her last visit.  She was  disappointed when she found out that surgery would be too risky due to her respiratory status and history of a CVA.  She has been attempting to lose weight.  She continues to use oxygen.  She has been having issues with constipation but stating using Miralax causes bloating.  She has intermittent lower abdominal pain that is relieved after having a bowel movement.  Daughter asking questions about next steps if something suspicious came back with her 6 month CA 125 testing and ultrasounds.  She would like to have her surveillance exams at our office.  She is also asking about a possible device to place in her vagina to assist with having a bowel movement since she states she can feel the stool there but she is unable to apply pressure in her vagina to assist with evacuation.  No other concerns voiced.     Review of Systems Constitutional: Feels well.  No fever, chills, early satiety, unintentional change in weight (she is attempting to lose weight at this time)  Cardiovascular: Mild shortness of breath intermittently but states much better with oxygen on.  No chest pain or edema.  Pulmonary: No cough or wheeze.  Gastrointestinal: Positive for moderate constipation.  No nausea, vomiting, or diarrhea. No bright red blood per rectum or change in bowel movement.  Genitourinary: No frequency, urgency, or dysuria. No vaginal bleeding or discharge.  Musculoskeletal: No myalgia or joint pain. Neurologic: No weakness, numbness, or change in gait.  Psychology: No depression, anxiety, or insomnia.  Current  Meds:  Outpatient Encounter Prescriptions as of 09/24/2016  Medication Sig  . albuterol (PROVENTIL HFA;VENTOLIN HFA) 108 (90 Base) MCG/ACT inhaler Inhale 2 puffs into the lungs every 4 (four) hours as needed for wheezing or shortness of breath.  Marland Kitchen aspirin EC 81 MG tablet Take 81 mg by mouth daily.  Marland Kitchen atenolol (TENORMIN) 50 MG tablet TAKE 1/2 TABLET BY MOUTH TWICE A DAY  . captopril (CAPOTEN) 50 MG tablet Take  50 mg by mouth 2 (two) times daily.  . Cholecalciferol (VITAMIN D3) 2000 units capsule 1 tablet by mouth daily  . clonazePAM (KLONOPIN) 2 MG tablet Take 1 tablet (2 mg total) by mouth at bedtime.  . fexofenadine (ALLEGRA) 180 MG tablet Take 180 mg by mouth daily as needed.   . fluticasone (FLONASE) 50 MCG/ACT nasal spray Place 2 sprays into the nose daily as needed for allergies. Spray twice daily as needed  . guaiFENesin (MUCINEX) 600 MG 12 hr tablet Take 1,200 mg by mouth 2 (two) times daily as needed for cough.   Marland Kitchen KEPPRA 750 MG tablet Take 1 tablet (750 mg total) by mouth 2 (two) times daily.  Marland Kitchen LEXAPRO 20 MG tablet Take 1 tablet (20 mg total) by mouth daily. Morning dosage  . meloxicam (MOBIC) 15 MG tablet 1 tablet  . simvastatin (ZOCOR) 10 MG tablet Take 10 mg by mouth at bedtime.  . triamterene-hydrochlorothiazide (MAXZIDE-25) 37.5-25 MG tablet Take 1 tablet by mouth daily.  Marland Kitchen umeclidinium-vilanterol (ANORO ELLIPTA) 62.5-25 MCG/INH AEPB Inhale 1 puff daily   No facility-administered encounter medications on file as of 09/24/2016.     Allergy:  Allergies  Allergen Reactions  . Prozac [Fluoxetine Hcl] Other (See Comments)    seizures  . Sulfa Antibiotics Nausea And Vomiting and Other (See Comments)    TINGLING IN LEGS ALSO  . Micardis [Telmisartan] Swelling and Other (See Comments)    Swelling, bruising Swelling, bruising  . Prednisone Other (See Comments)    mood changes Mood changes  . Lisinopril-Hydrochlorothiazide Other (See Comments)    unknown  . Augmentin [Amoxicillin-Pot Clavulanate] Rash  . Avelox [Moxifloxacin Hcl In Nacl] Rash    LEVAQUIN IS OK-SEE NOTE  . Norvasc [Amlodipine Besylate] Other (See Comments)    fatigue    Social Hx:   Social History   Social History  . Marital status: Married    Spouse name: Jenny Reichmann  . Number of children: 2  . Years of education: 12   Occupational History  . Not on file.   Social History Main Topics  . Smoking status:  Former Smoker    Packs/day: 1.00    Years: 41.00    Quit date: 01/13/2006  . Smokeless tobacco: Never Used  . Alcohol use No  . Drug use: No  . Sexual activity: No   Other Topics Concern  . Not on file   Social History Narrative   Patient is married ([name]) and lives at home with her husband.   Patient is retired.   Patient has two children.   Patient has a high school education.   Patient is right-handed.   Patient drinks 2 big mugs of coffee daily and sometimes tea in the evenings.    Past Surgical Hx:  Past Surgical History:  Procedure Laterality Date  . broken ankle Right   . CEREBRAL ANEURYSM REPAIR    . TONSILLECTOMY      Past Medical Hx:  Past Medical History:  Diagnosis Date  . BRCA2 positive 2017  . Diverticulosis   .  Hearing loss   . HTN (hypertension)   . Memory loss   . Seizure disorder, complex partial, 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[place]) seizure free for one year  . Seizures ([place])   . 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   . Vestibulitis of ear     Family Hx:  Family History  Problem Relation Age of Onset  . Dementia Mother   . Stroke Mother   . Thyroid disease Mother   . Osteoporosis Mother   . Ovarian cancer Paternal Grandmother 40  . Ovarian cancer Paternal Aunt 61  . Breast cancer Daughter 50       breast ca x2, bilateral dx. 38, 46--estrogen  . Other Daughter        no genetic testing as of 07/2015; has had ovaries removed  . Other Daughter        BRCA 2 Pos.  . Colon cancer Maternal Aunt        dx. unspecified age  . Emphysema Maternal Uncle   . High blood pressure Maternal Grandmother   . Dementia Maternal Grandfather   . Bleeding Disorder Maternal Grandfather   . Emphysema Maternal Aunt   . Emphysema Maternal Aunt     Vitals:  Blood pressure (!) 105/51, pulse (!) 50, temperature 97.7 F (36.5 C), temperature source Oral, resp. rate (!) 22, weight 231 lb 6.4 oz (105 kg), last menstrual period 01/14/1995, SpO2 95 %.[place] 09/17/16:  [place]  on 09/17/16 IMPRESSION: 1. Nonvisualization of the ovaries. No adnexal mass or abnormal free fluid identified. 2. 6 mm intramural hyperechoic focus within the uterus, which may reflect a small calcification or possibly tiny calcified fibroid. Otherwise normal sonographic appearance of the uterus and endometrium.  Physical Exam:  General: Well developed, well nourished female in no acute distress. Alert and oriented x 3.   Cardiovascular: Regular rate and rhythm. S1 and S2 normal.  Lungs: Clear to auscultation bilaterally. No wheezes/crackles/rhonchi noted.  Skin: No rashes or lesions present. Back: No CVA tenderness.  Abdomen: Abdomen soft, non-tender and morbidly obese. Active bowel sounds in all quadrants. No evidence of a fluid wave or abdominal masses but examination limited due to habitus.  Genitourinary:    Vulva/vagina: Normal external female genitalia. No lesions.    Urethra: No lesions or masses    Vagina: Cervix within normal limits without lesions.  Vagina atrophic without any lesions. No palpable masses. No vaginal bleeding or drainage noted. Mild prolapse of the uterus noted. Rectal: Patient deferred due to constipation.  Extremities: No bilateral cyanosis, edema, or clubbing.   Assessment/Plan: 76 year old female with BRCA2 mutation who has a complicated medical history with COPD, BMI 42, s/p CVA with residual seizure disorder.  Continue with surveillance since surgery would pose significant risk to patient.  [place] without concerning features and CA 125 within normal range.  Patient would like to try a small dilator to apply pressure against the vaginal wall when having a bowel movement.  Small dilator given and instructions discussed.  Plan to repeat ultrasound and CA 125 in six months time.  Reportable signs and symptoms reviewed.  She is to call for any new persistent symptoms, needs, or concerns.  Dr. Alycia Rossetti agrees with the plan.  She is advised to call for any questions or  concerns.         Wiktoria Hemrick DEAL, NP 09/26/2016, 2:54 PM

## 2016-10-28 DIAGNOSIS — F329 Major depressive disorder, single episode, unspecified: Secondary | ICD-10-CM | POA: Diagnosis not present

## 2016-10-28 DIAGNOSIS — M2550 Pain in unspecified joint: Secondary | ICD-10-CM | POA: Diagnosis not present

## 2016-10-28 DIAGNOSIS — E6609 Other obesity due to excess calories: Secondary | ICD-10-CM | POA: Diagnosis not present

## 2016-10-28 DIAGNOSIS — Z6839 Body mass index (BMI) 39.0-39.9, adult: Secondary | ICD-10-CM | POA: Diagnosis not present

## 2016-10-28 DIAGNOSIS — I10 Essential (primary) hypertension: Secondary | ICD-10-CM | POA: Diagnosis not present

## 2016-10-28 DIAGNOSIS — Z23 Encounter for immunization: Secondary | ICD-10-CM | POA: Diagnosis not present

## 2016-10-28 DIAGNOSIS — E7849 Other hyperlipidemia: Secondary | ICD-10-CM | POA: Diagnosis not present

## 2016-10-28 DIAGNOSIS — E119 Type 2 diabetes mellitus without complications: Secondary | ICD-10-CM | POA: Diagnosis not present

## 2016-11-24 ENCOUNTER — Telehealth: Payer: Self-pay | Admitting: *Deleted

## 2016-11-24 NOTE — Telephone Encounter (Signed)
This RN returned VM left pt requesting a return call " I have a problem and some answers about my appointment this Thursday. "  Per call obtained an automated message stating "mailbox cannot be located ".

## 2016-11-24 NOTE — Progress Notes (Signed)
[name]  [phone] [phone]     ID: [name] DOB: Mar 22, 1940  MR#: [id number]  VOZ#:[id number]  Patient Care Team: Kathyrn Lass, MD as PCP - General (Family Medicine) Magrinat, Virgie Dad, MD as Consulting Physician (Oncology) Josue Hector, MD as Consulting Physician (Cardiology) Rigoberto Noel, MD as Consulting Physician (Pulmonary Disease) Dohmeier, Asencion Partridge, MD as Consulting Physician (Neurology) Martinique, Amy, MD as Consulting Physician (Dermatology) Elsie Saas, MD as Consulting Physician (Orthopedic Surgery) Wonda Horner, MD as Consulting Physician (Gastroenterology) Everitt Amber, MD as Consulting Physician (Obstetrics and Gynecology) OTHER MD:  CHIEF COMPLAINT: 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 Beth [name] subsequent had a second cancer which she also has survived. On the father's side the patient has little information except that her paternal grandmother had ovarian cancer and one of the paternal aunts also had ovarian cancer, both of them dying in their 34s.  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  She was referred for consideration of risk reducon and intensified screening strategies.  INTERVAL HISTORY: Beth [name] returns today for a follow-up and is accompanied by her daughter, Beth [name].  After the last visit here she pretty much has decided not to take antiestrogens.  We are following her with mammography and MRI yearly, separated every 6 months.  She also sees the gynecology oncology group here.  They have set her up for lab work and pelvic ultrasound every 6 months.   She is scheduled for an ultra sound in February 2019.  REVIEW OF SYSTEMS: Valari is doing well. She is on oxygen 24/7 for her COPD. She denies unusual headaches, visual changes, nausea, vomiting, or dizziness. There has been no unusual cough, phlegm production, or pleurisy. This been no change in bowel or bladder habits. She denies unexplained fatigue or unexplained weight loss, bleeding, rash, or fever. A detailed review of systems was otherwise entirely stable.    PAST MEDICAL HISTORY: Past Medical History:  Diagnosis Date  . BRCA2 positive 2017  . Diverticulosis   . Hearing loss   . HTN (hypertension)   . Memory loss   . 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 Seizures ([place])   . 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   . Vestibulitis of ear     PAST SURGICAL HISTORY: Past Surgical History:  Procedure Laterality Date  . broken ankle Right   . CEREBRAL ANEURYSM REPAIR    . TONSILLECTOMY      FAMILY HISTORY Family History  Problem Relation Age of Onset  . Dementia Mother   . Stroke Mother   . Thyroid disease Mother   . Osteoporosis Mother   . Ovarian cancer Paternal Grandmother 72  . Ovarian cancer Paternal Aunt 85  . Breast cancer Daughter 37       breast ca x2, bilateral dx. 38, 46--estrogen  . Other Daughter        no genetic testing as of 07/2015; has had ovaries removed  . Other Daughter        BRCA 2 Pos.  . Colon cancer Maternal Aunt        dx.  unspecified age  . Emphysema Maternal Uncle   . High blood pressure Maternal Grandmother   . Dementia Maternal Grandfather   . Bleeding Disorder Maternal Grandfather   . Emphysema Maternal Aunt   . Emphysema Maternal Aunt     GYNECOLOGIC HISTORY:  Patient's last menstrual period was 01/14/1995 (approximate). Menarche age 56, first live birth age 39. The patient is GX to. 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 Her husband Beth [name] is a retired Charity fundraiser. He also worked for the [employer]. At home is just the 2 of them plus their dog. The patient's daughter, Beth [name] has not yet been tested for the BRCA gene. The patient's younger daughter Beth [name] is BRCA2 positive and she has undergone a total abdominal hysterectomy with bilateral salpingo-oophorectomy and also bilateral mastectomies.    ADVANCED DIRECTIVES: The patient's husband is her healthcare power of attorney     HEALTH MAINTENANCE: Social History   Tobacco Use  . Smoking status: Former Smoker    Packs/day: 1.00    Years: 41.00    Pack years: 41.00    Last attempt to quit: 01/13/2006    Years since quitting: 10.8  . Smokeless tobacco: Never Used  Substance Use Topics  . Alcohol use: No    Alcohol/week: 0.0 oz  . Drug use: No     Colonoscopy: [facility]  PAP:  Bone density: "normal"   Allergies  Allergen Reactions  . Prozac [Fluoxetine Hcl] Other (See Comments)    seizures  . Sulfa Antibiotics Nausea And Vomiting and Other (See Comments)    TINGLING IN LEGS ALSO  . Micardis [Telmisartan] Swelling and Other (See Comments)    Swelling, bruising Swelling, bruising  . Prednisone Other (See Comments)    mood changes Mood changes  . Lisinopril-Hydrochlorothiazide Other (See Comments)    unknown  . Augmentin [Amoxicillin-Pot Clavulanate] Rash  . Avelox [Moxifloxacin Hcl In Nacl] Rash    LEVAQUIN IS OK-SEE NOTE  . Norvasc [Amlodipine Besylate] Other (See Comments)    fatigue    Current Outpatient Medications  Medication Sig Dispense Refill  . albuterol (PROVENTIL HFA;VENTOLIN HFA) 108 (90 Base) MCG/ACT inhaler Inhale 2 puffs into the lungs every 4 (four) hours as needed for wheezing or shortness of breath. 1 Inhaler 5  . aspirin EC 81 MG tablet Take 81 mg by mouth daily.    Marland Kitchen atenolol (TENORMIN) 50 MG tablet TAKE 1/2 TABLET BY MOUTH TWICE A DAY     . captopril (CAPOTEN) 50 MG tablet Take 50 mg by mouth 2 (two) times daily.    . Cholecalciferol (VITAMIN D3) 2000 units capsule 1 tablet by mouth daily    . clonazePAM (KLONOPIN) 2 MG tablet Take 1 tablet (2 mg total) by mouth at bedtime. 90 tablet 1  . fexofenadine (ALLEGRA) 180 MG tablet Take 180 mg by mouth daily as needed.     . fluticasone (FLONASE) 50 MCG/ACT nasal spray Place 2 sprays into the nose daily as needed for allergies. Spray twice daily as needed    . guaiFENesin (MUCINEX) 600 MG 12 hr tablet Take 1,200 mg by mouth 2 (two) times daily as needed for cough.     Marland Kitchen KEPPRA 750 MG tablet Take 1 tablet (750 mg total) by mouth 2 (two) times daily. 180 tablet 3  . LEXAPRO 20 MG tablet Take 1 tablet (20 mg total) by mouth  daily. Morning dosage 90 tablet 3  . meloxicam (MOBIC) 15 MG tablet 1 tablet    . simvastatin (ZOCOR) 10 MG tablet Take 10 mg by mouth at bedtime.    . triamterene-hydrochlorothiazide (MAXZIDE-25) 37.5-25 MG tablet Take 1 tablet by mouth daily.    Marland Kitchen umeclidinium-vilanterol (ANORO ELLIPTA) 62.5-25 MCG/INH AEPB Inhale 1 puff daily 1 each 5   No current facility-administered medications for this visit.     OBJECTIVE:Morbidly obese white woman on oxygen by nasal cannula Vitals:   11/27/16 1332  BP: 132/64  Pulse: 68  Resp: (!) 24  Temp: 97.7 F (36.5 C)  SpO2: 96%     Body mass index is 39.38 kg/m.    ECOG FS:2 - Symptomatic, <50% confined to bed  Sclerae unicteric, EOMs intact Oropharynx clear and moist No cervical or supraclavicular adenopathy Lungs no rales or rhonchi Heart regular rate and rhythm Abd soft, nontender, positive bowel sounds MSK no focal spinal tenderness, no upper extremity lymphedema Neuro: nonfocal, well oriented, appropriate affect Breasts: I do not palpate any suspicious masses in either breast.  There are no skin or nipple changes of concern.  Both axillae are benign.  LAB RESULTS:  CMP     Component Value Date/Time   NA 134  (L) 07/30/2015 2103   K 3.3 (L) 07/30/2015 2103   CL 97 (L) 07/30/2015 2103   CO2 27 07/30/2015 2103   GLUCOSE 176 (H) 07/30/2015 2103   BUN 11 07/30/2015 2103   CREATININE 0.80 07/30/2015 2103   CALCIUM 9.3 07/30/2015 2103   PROT 7.1 08/18/2014 1522   ALBUMIN 3.7 08/18/2014 1522   AST 22 08/18/2014 1522   ALT 19 08/18/2014 1522   ALKPHOS 46 08/18/2014 1522   BILITOT 0.6 08/18/2014 1522   GFRNONAA >60 07/30/2015 2103   GFRAA >60 07/30/2015 2103    No results found for: TOTALPROTELP, ALBUMINELP, A1GS, A2GS, BETS, BETA2SER, GAMS, MSPIKE, SPEI  No results found for: Nils Pyle, [facility]  Lab Results  Component Value Date   WBC 9.1 07/30/2015   NEUTROABS 6.0 06/10/2008   HGB 14.7 07/30/2015   HCT 43.0 07/30/2015   MCV 95.3 07/30/2015   PLT 247 07/30/2015      Chemistry      Component Value Date/Time   NA 134 (L) 07/30/2015 2103   K 3.3 (L) 07/30/2015 2103   CL 97 (L) 07/30/2015 2103   CO2 27 07/30/2015 2103   BUN 11 07/30/2015 2103   CREATININE 0.80 07/30/2015 2103      Component Value Date/Time   CALCIUM 9.3 07/30/2015 2103   ALKPHOS 46 08/18/2014 1522   AST 22 08/18/2014 1522   ALT 19 08/18/2014 1522   BILITOT 0.6 08/18/2014 1522       No results found for: LABCA2  No components found for: [id number]  No results for input(s): INR in the last 168 hours.  Urinalysis    Component Value Date/Time   COLORURINE YELLOW 08/18/2014 1742   APPEARANCEUR CLEAR 08/18/2014 1742   LABSPEC 1.006 08/18/2014 1742   PHURINE 6.5 08/18/2014 1742   GLUCOSEU NEGATIVE 08/18/2014 1742   HGBUR TRACE (A) 08/18/2014 1742   BILIRUBINUR NEGATIVE 08/18/2014 1742   KETONESUR NEGATIVE 08/18/2014 1742   PROTEINUR NEGATIVE 08/18/2014 1742   UROBILINOGEN 0.2 08/18/2014 1742   NITRITE NEGATIVE 08/18/2014 1742   LEUKOCYTESUR TRACE (A) 08/18/2014 1742     STUDIES: Mr Breast Bilateral W Wo Contrast  Result Date: 11/27/2016 CLINICAL DATA:  BRCA 2 positive.  Patient's daughter  had premenopausal breast cancer. Family history of ovarian cancer. LABS:  Creatinine 0.9, GFR 63. EXAM: BILATERAL BREAST MRI WITH AND WITHOUT CONTRAST TECHNIQUE: Multiplanar, multisequence MR images of both breasts were obtained prior to and following the intravenous administration of 20 ml of MultiHance. THREE-DIMENSIONAL MR IMAGE RENDERING ON INDEPENDENT WORKSTATION: Three-dimensional MR images were rendered by post-processing of the original MR data on an independent workstation. The three-dimensional MR images were interpreted, and findings are reported in the following complete MRI report for this study. Three dimensional images were evaluated at the independent DynaCad workstation COMPARISON:  Previous exam(s). FINDINGS: Breast composition: a. Almost entirely fat. Background parenchymal enhancement: Minimal Right breast: No mass or abnormal enhancement. Left breast: No mass or abnormal enhancement. Lymph nodes: No abnormal appearing lymph nodes. Ancillary findings:  None. IMPRESSION: No evidence of malignancy within either breast. RECOMMENDATION: Annual screening mammography and annual screening breast MRIs. BI-RADS CATEGORY  1: Negative. Electronically Signed   By: Franki Cabot M.D.   On: 11/27/2016 10:06    ELIGIBLE FOR AVAILABLE RESEARCH PROTOCOL: No  ASSESSMENT: 76 y.o. BRCA2 positive [place] woman at increased risk for breast and ovarian cancer  (a) the specific BRCA2 mutations is [id number]).   (1) consider anastrozole for breast cancer risk reduction  (a) DEXA scan 01/16/2014 normal, with a T score of 0.7  (2) yearly MRI in addition to yearly mammography, 6 months apart, for intensified breast cancer screening  (3) unable to undergo bilateral salpingo-oophorectomy--followed by gynecological oncology with biannual pelvic ultrasound and Ca1 25 determinations  PLAN: Jaryiah is very pleased with the results of her MRI, as she should be.  This is an  excellent test and it is entirely unremarkable.  As far as the breast cancer is concerned the plan is for mammography every May and breast MRI every November.   Karthika has decided not to take anastrozole.  She will continue on observation alone.  She understands we do not have a reliable way to screen for ovarian cancer.  Since she is unable to undergo bilateral salpingo-oophorectomy were doing the best we can with every 6 months lab work and pelvic ultrasound.  This is arranged through our gynecology oncology group.  Accordingly she will see GYN on croup in February and see me again in May.  Denina knows to call for any other issues that may develop before the next visit.   Magrinat, Virgie Dad, MD  11/27/16 2:00 PM Medical Oncology and Hematology [facility] [address] [place], [place] [address] Tel. [phone]    Fax. [phone]  This document serves as a record of services personally performed by Chauncey Cruel, MD. It was created on his behalf by Margit Banda, a trained medical scribe. The creation of this record is based on the scribe's personal observations and the provider's statements to them.   I have reviewed the above documentation for accuracy and completeness, and I agree with the above.

## 2016-11-26 ENCOUNTER — Ambulatory Visit
Admission: RE | Admit: 2016-11-26 | Discharge: 2016-11-26 | Disposition: A | Discharge status: Home / Self Care | Payer: Medicare Other | Source: Ambulatory Visit | Attending: Oncology | Admitting: Oncology

## 2016-11-26 DIAGNOSIS — Z8041 Family history of malignant neoplasm of ovary: Secondary | ICD-10-CM

## 2016-11-26 DIAGNOSIS — Z1509 Genetic susceptibility to other malignant neoplasm: Principal | ICD-10-CM

## 2016-11-26 DIAGNOSIS — Z1501 Genetic susceptibility to malignant neoplasm of breast: Secondary | ICD-10-CM

## 2016-11-26 DIAGNOSIS — Z1379 Encounter for other screening for genetic and chromosomal anomalies: Secondary | ICD-10-CM

## 2016-11-26 DIAGNOSIS — C50919 Malignant neoplasm of unspecified site of unspecified female breast: Secondary | ICD-10-CM | POA: Diagnosis not present

## 2016-11-26 MED ORDER — GADOBENATE DIMEGLUMINE 529 MG/ML IV SOLN
20.0000 mL | Freq: Once | INTRAVENOUS | Status: AC | PRN
  Administered 2016-11-26: 20 mL via INTRAVENOUS

## 2016-11-27 ENCOUNTER — Ambulatory Visit (HOSPITAL_BASED_OUTPATIENT_CLINIC_OR_DEPARTMENT_OTHER): Payer: Medicare Other | Admitting: Oncology

## 2016-11-27 VITALS — BP 132/64 | HR 68 | Temp 97.7°F | Resp 24 | Ht 64.0 in | Wt 229.4 lb

## 2016-11-27 DIAGNOSIS — Z1502 Genetic susceptibility to malignant neoplasm of ovary: Secondary | ICD-10-CM

## 2016-11-27 DIAGNOSIS — Z1501 Genetic susceptibility to malignant neoplasm of breast: Secondary | ICD-10-CM

## 2016-11-27 DIAGNOSIS — Z803 Family history of malignant neoplasm of breast: Secondary | ICD-10-CM

## 2016-11-27 DIAGNOSIS — J449 Chronic obstructive pulmonary disease, unspecified: Secondary | ICD-10-CM | POA: Diagnosis not present

## 2016-11-27 DIAGNOSIS — Z8041 Family history of malignant neoplasm of ovary: Secondary | ICD-10-CM | POA: Diagnosis not present

## 2016-11-27 DIAGNOSIS — Z1509 Genetic susceptibility to other malignant neoplasm: Secondary | ICD-10-CM

## 2016-11-27 DIAGNOSIS — F09 Unspecified mental disorder due to known physiological condition: Secondary | ICD-10-CM

## 2016-11-28 ENCOUNTER — Other Ambulatory Visit: Payer: Self-pay | Admitting: Oncology

## 2016-11-28 DIAGNOSIS — Z1231 Encounter for screening mammogram for malignant neoplasm of breast: Secondary | ICD-10-CM

## 2016-11-28 DIAGNOSIS — Z1509 Genetic susceptibility to other malignant neoplasm: Secondary | ICD-10-CM

## 2016-11-28 DIAGNOSIS — Z1501 Genetic susceptibility to malignant neoplasm of breast: Secondary | ICD-10-CM

## 2016-12-01 ENCOUNTER — Ambulatory Visit: Payer: Medicare Other | Admitting: Pulmonary Disease

## 2016-12-05 ENCOUNTER — Telehealth: Payer: Self-pay | Admitting: Oncology

## 2016-12-05 NOTE — Telephone Encounter (Signed)
Scheduled appt per 11/15 los - sending confirmation letter to confirm appts.

## 2017-01-20 ENCOUNTER — Ambulatory Visit: Payer: Medicare Other | Admitting: Pulmonary Disease

## 2017-02-13 ENCOUNTER — Encounter: Payer: Self-pay | Admitting: Pulmonary Disease

## 2017-02-13 ENCOUNTER — Ambulatory Visit (INDEPENDENT_AMBULATORY_CARE_PROVIDER_SITE_OTHER): Payer: Medicare Other | Admitting: Pulmonary Disease

## 2017-02-13 DIAGNOSIS — J449 Chronic obstructive pulmonary disease, unspecified: Secondary | ICD-10-CM

## 2017-02-13 DIAGNOSIS — J9611 Chronic respiratory failure with hypoxia: Secondary | ICD-10-CM | POA: Diagnosis not present

## 2017-02-13 NOTE — Progress Notes (Signed)
   Subjective:    Patient ID: Beth [name], female    DOB: 1940/11/15, 77 y.o.   MRN: [id number]  HPI  77 yo ex-smoker, quit in 2009, For follow-up of COPD ,Started on O2 11/2015 Pulmonary rehab had to be terminated due to hypoxia and poor rinvolvement  She has several questions today -Nasal drainage with minimal yellow sputum -Tolerating anoro well -Has not been able to get portable concentrator from [facility] and would like to change DME -Long oxygen cord and she occasionally feels like she is not getting enough oxygen  Has not needed rescue inhaler muchMuch more compliant with oxygen now    Medication review shows captopril , has occasional tickle in her throat but does not tolerated ARB due to swelling  Significant tests/ events  CT angio chest 07/2015 >>no pulmonary embolism, no emphysema  2010 head CT >>polyps or mucous retention cysts seen within the maxillary sinuses bilaterally  PFTs 09/2015>>severe airway obstruction with FEV1 40%, improves to 47% with albuterol,DLCO decreased to 45%   Review of Systems neg for any significant sore throat, dysphagia, itching, sneezing, nasal congestion or excess/ purulent secretions, fever, chills, sweats, unintended wt loss, pleuritic or exertional cp, hempoptysis, orthopnea pnd or change in chronic leg swelling. Also denies presyncope, palpitations, heartburn, abdominal pain, nausea, vomiting, diarrhea or change in bowel or urinary habits, dysuria,hematuria, rash, arthralgias, visual complaints, headache, numbness weakness or ataxia.     Objective:   Physical Exam  Gen. Pleasant, obese, in no distress ENT - no lesions, no post nasal drip Neck: No JVD, no thyromegaly, no carotid bruits Lungs: no use of accessory muscles, no dullness to percussion, decreased without rales or rhonchi  Cardiovascular: Rhythm regular, heart sounds  normal, no murmurs or gallops, no peripheral edema Musculoskeletal: No deformities, no cyanosis or  clubbing , no tremors       Assessment & Plan:

## 2017-02-13 NOTE — Patient Instructions (Signed)
Stay on Anoro We will send Rx to [facility] for POC

## 2017-02-13 NOTE — Assessment & Plan Note (Signed)
Will help her to change DME and send prescription in for portable concentrator

## 2017-02-13 NOTE — Assessment & Plan Note (Signed)
Continue Anoro -this seems to have improved her exercise tolerance

## 2017-02-24 ENCOUNTER — Ambulatory Visit (HOSPITAL_COMMUNITY)
Admission: RE | Admit: 2017-02-24 | Discharge: 2017-02-24 | Disposition: A | Discharge status: Home / Self Care | Payer: Medicare Other | Source: Ambulatory Visit | Attending: Gynecologic Oncology | Admitting: Gynecologic Oncology

## 2017-02-24 ENCOUNTER — Inpatient Hospital Stay: Payer: Medicare Other | Attending: Oncology

## 2017-02-24 ENCOUNTER — Telehealth: Payer: Self-pay | Admitting: Pulmonary Disease

## 2017-02-24 DIAGNOSIS — Z1502 Genetic susceptibility to malignant neoplasm of ovary: Secondary | ICD-10-CM | POA: Insufficient documentation

## 2017-02-24 DIAGNOSIS — R1909 Other intra-abdominal and pelvic swelling, mass and lump: Secondary | ICD-10-CM

## 2017-02-24 DIAGNOSIS — Z8041 Family history of malignant neoplasm of ovary: Secondary | ICD-10-CM | POA: Diagnosis not present

## 2017-02-24 DIAGNOSIS — M25559 Pain in unspecified hip: Secondary | ICD-10-CM | POA: Insufficient documentation

## 2017-02-24 DIAGNOSIS — Z1509 Genetic susceptibility to other malignant neoplasm: Secondary | ICD-10-CM | POA: Insufficient documentation

## 2017-02-24 DIAGNOSIS — Z1501 Genetic susceptibility to malignant neoplasm of breast: Secondary | ICD-10-CM | POA: Insufficient documentation

## 2017-02-24 DIAGNOSIS — J449 Chronic obstructive pulmonary disease, unspecified: Secondary | ICD-10-CM | POA: Insufficient documentation

## 2017-02-24 DIAGNOSIS — R1030 Lower abdominal pain, unspecified: Secondary | ICD-10-CM | POA: Insufficient documentation

## 2017-02-24 DIAGNOSIS — J9611 Chronic respiratory failure with hypoxia: Secondary | ICD-10-CM

## 2017-02-24 NOTE — Telephone Encounter (Signed)
Received call from [facility] who spoke with Corene Cornea w/ [facility]  Unfortunately, because patient has been with [facility] over 13 months (pt established with [facility] in November 2017), Medicare has paid everything they are going to pay and switch will not be possible until the 5 year mark, 2022.  Called spoke with patient's spouse Jenny Reichmann and discussed the above Jenny Reichmann voiced his understanding Nothing further needed; will sign off

## 2017-02-24 NOTE — Telephone Encounter (Signed)
Patient seen 2.1.19 by RA for follow up: Patient Instructions  Stay on Anoro We will send Rx to [facility] for POC   Order not placed to switch DMEs/POC Called spoke with patient's spouse Beth [name] who verified that pt is currently with [facility] and has been asking for a "battery operated" unit for approx 3 months without success and they would now like to switch DME companies.  Apologized to spouse and informed him unsure what happened and will do some research and call him back. Called [facility], spoke with [name] >> apparently the order for POC was received in Nov 2018 and as of 12/30/16 pt was approved, they are just waiting on the shipment of POCs from the manufacturer.  [name] left message with [name] in that dept to research and see if any headway has been made.  Called [facility] and spoke with [name] in referral dept who reported pt will not likely be able to be switched because she has not been with [facility] for 5 years and Medicare will not allow switch.  [name] recommended to still send order to check.  Will go ahead and place order for official documentation purposes.    Called spoke with pt spouse Beth [name] and informed him that order has been placed, marked URGENT because this is 11 days after the appt with RA.  Did discuss with Beth [name] my conversation with [facility] and that [name] is going to check on the POC.  Per Beth [name], he speaks with [name] weekly and receives the same answer.  Advised [name] to give it a week and call us back if he doesn't hear anything by next Tuesday.  [name] voiced his understanding.  Nothing further needed at this time; will sign off.

## 2017-02-25 LAB — CA 125: Cancer Antigen (CA) 125: 13.8 U/mL (ref 0.0–38.1)

## 2017-04-13 ENCOUNTER — Ambulatory Visit (INDEPENDENT_AMBULATORY_CARE_PROVIDER_SITE_OTHER): Payer: Medicare Other | Admitting: Neurology

## 2017-04-13 ENCOUNTER — Encounter: Payer: Self-pay | Admitting: Neurology

## 2017-04-13 ENCOUNTER — Telehealth: Payer: Self-pay | Admitting: Neurology

## 2017-04-13 VITALS — BP 118/67 | HR 56 | Ht 64.0 in | Wt 227.0 lb

## 2017-04-13 DIAGNOSIS — G3184 Mild cognitive impairment, so stated: Secondary | ICD-10-CM

## 2017-04-13 DIAGNOSIS — F09 Unspecified mental disorder due to known physiological condition: Secondary | ICD-10-CM | POA: Diagnosis not present

## 2017-04-13 DIAGNOSIS — G40409 Other generalized epilepsy and epileptic syndromes, not intractable, without status epilepticus: Secondary | ICD-10-CM

## 2017-04-13 DIAGNOSIS — I609 Nontraumatic subarachnoid hemorrhage, unspecified: Secondary | ICD-10-CM | POA: Diagnosis not present

## 2017-04-13 MED ORDER — KEPPRA 750 MG PO TABS: 750.0000 mg | ORAL_TABLET | Freq: Two times a day (BID) | ORAL | 3 refills | Status: DC

## 2017-04-13 NOTE — Telephone Encounter (Signed)
Medicare/tricare order sent to [facility] no auth they will contact pt to schedule.

## 2017-04-13 NOTE — Addendum Note (Signed)
Addended by: Larey Seat on: 04/13/2017 03:40 PM   Modules accepted: Orders

## 2017-04-13 NOTE — Progress Notes (Signed)
PATIENT: Beth Beth [name] DOB: [birth year]  REASON FOR VISIT: follow up- seizures HISTORY FROM: patient alone  HISTORY OF PRESENT ILLNESS:   04-13-2017, Patient is here for seizure follow up, concentration and memory problems.  Beth Beth [name] endorsed the geriatric depression score at 5 out of 15 points, which is a borderline result.  She states she had no seizure activity over the last 6-8 months, she is now using oxygen 24/7 portable tank is with her today. COPD  Beth Beth [name] reports that some of her routines and rituals have changed-certainly over the last year- again she feels that she is less organized, she has often skipped certain activities that she used to do in order, and at one time she accused her husband of getting lost while he was driving, because she could not recognize the surroundings. Not cooking or backing any longer.      History : MM Beth Beth [name] is a 77 year old female with a history of seizures. She returns today for follow-up. She continues to take Keppra 750 mg twice a day. She denies any seizure events. She is able to complete all ADLs independently. She is currently not operating a motor vehicle. In the past the patient has reported trouble with her memory. At the last visit her memory score has remained stable. Patient reports that her memory continued to remained stable. 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 She states that she has been diagnosed with COPD. During this flare up she felt that she had an abnormal heart rhythm and was concerned about a potential stroke. However she reports she did not have any strokelike symptoms. She is currently on medication and inhalers to treat her COPD that she feels is working well for her. She returns today for an evaluation.   HISTORY 04/10/15: Beth Beth [name] is a 77 year old female with a history of seizures. She returns today for follow-up. She is  currently taking Keppra 750 mg twice a day. She reports that she is tolerating this medication well. She denies any new neurological symptoms. She states at home she is able to complete all ADLs independently. She stopped driving after her stroke. 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 She states that at the last visit Beth Beth [name] ordered belsomra and gave her samples however she did not start this medication. She states that she has continue using the Klonopin. She reports that she has had some issues breathing and was given albuterol. She states that she is still having some difficulty breathing and her primary care mentioned a sleep study. She denies snoring. She denies any excessive daytime sleepiness. Denies a morning headache. She states that her husband has not witnessed any apnea events. The patient feels that her memory has remained stable. She just returned from traveling to see family members. She does report some numbness in the fingertips on the right hand. Occasionally will drop things in the right hand. However this time she does not want to proceed with any additional testing. She returns today for an evaluation.  HISTORY 10/11/14 [facility]): Patient returned for a yearly follow up. She reports medical problems: a carbuncle with MRSA, diagnosed spring 2016 and treated with ATB ( doxicycline) by Dr. Amy Martinique , Dermatologist . The patient tolerated the oral doxycycline fine. Beth Beth [name]'s cognitive status remains stable we performed a Montral cognitive assessment test today and she scored well on the  clock drawing she has a mild tremor which impairs the contour of the clock face, she scored well on this Trail Making Test name 3 animals had some trouble again this tremor on drawing a cube. Was able to do the attention tests but not subtraction. Language and abstraction were tested the patient only had a word fluency test of 9 points. She was able to score 4 out of 5  recall words and was fully oriented to date place and time. She scored 26 out of 30 points, is a HS graduate. 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 No seizures in the interval time.   Beth Beth [name] is a 77 y.o. female here seen originally many years ago as a referral from BethKevin Little ,  Beth Beth [name] is an established patient of my practice. 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  She also has a history of hypertension, osteopenia, vertigo and some mild hearing loss, classified as vestibulitis in her older records. Surgical history is positive for right foot fracture in May 2010 and 4 surgeries to the ankle. Her ankle was fractured in a seizure related incident 2010,  twisting her foot in the midst of her typical left twisting body movements during a seizure.  Beth Beth [name] brain injury in 2008 has affected her cognitively and she often struggles to find words. 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 She is taking 20 mg daily. She also takes vitamin D against fatigue. Her affect is better controlled and she feels more assured, and now is less socially isolated.  Beth Beth [name] had been seizure-free for over 12 months by the time of her April 2014 visit, now she reports that she had one seizure around Christmas 2014, while compliant on her current regimen of Keppra and Clonazepam.  She fills her medications at [facility] on [address] in [place].Vestibular evaluation for severe vertigo   REVIEW OF SYSTEMS: Out of a complete 14 system review of symptoms, the patient complains only of the following symptoms, and all other reviewed systems are negative.  MCI - since stroke, small vessel; diease. Seizures.   ALLERGIES: Allergies  Allergen Reactions  . Prozac [Fluoxetine Hcl] Other (See  Comments)    seizures  . Sulfa Antibiotics Nausea And Vomiting and Other (See Comments)    TINGLING IN LEGS ALSO  . Micardis [Telmisartan] Swelling and Other (See Comments)    Swelling, bruising Swelling, bruising  . Prednisone Other (See Comments)    mood changes Mood changes  . Lisinopril-Hydrochlorothiazide Other (See Comments)    unknown  . Augmentin [Amoxicillin-Pot Clavulanate] Rash  . Avelox [Moxifloxacin Hcl In Nacl] Rash    LEVAQUIN IS OK-SEE NOTE  . Norvasc [Amlodipine Besylate] Other (See Comments)    fatigue    HOME MEDICATIONS: Outpatient Medications Prior to Visit  Medication Sig Dispense Refill  . albuterol (PROVENTIL HFA;VENTOLIN HFA) 108 (90 Base) MCG/ACT inhaler Inhale 2 puffs into the lungs every 4 (four) hours as needed for wheezing or shortness of breath. 1 Inhaler 5  . aspirin EC 81 MG tablet Take 81 mg by mouth daily.    Marland Kitchen atenolol (TENORMIN) 50 MG tablet TAKE 1/2 TABLET BY MOUTH TWICE A DAY    . captopril (CAPOTEN) 50 MG tablet Take 50 mg by mouth 2 (two) times daily.    . Cholecalciferol (  VITAMIN D3) 2000 units capsule 1 tablet by mouth daily    . clonazePAM (KLONOPIN) 2 MG tablet Take 1 tablet (2 mg total) by mouth at bedtime. 90 tablet 1  . fexofenadine (ALLEGRA) 180 MG tablet Take 180 mg by mouth daily as needed.     . fluticasone (FLONASE) 50 MCG/ACT nasal spray Place 2 sprays into the nose daily as needed for allergies. Spray twice daily as needed    . guaiFENesin (MUCINEX) 600 MG 12 hr tablet Take 1,200 mg by mouth 2 (two) times daily as needed for cough.     . KEPPRA 750 MG tablet Take 1 tablet (750 mg total) by mouth 2 (two) times daily. 180 tablet 3  . LEXAPRO 20 MG tablet Take 1 tablet (20 mg total) by mouth daily. Morning dosage 90 tablet 3  . meloxicam (MOBIC) 15 MG tablet 1 tablet as needed    . simvastatin (ZOCOR) 10 MG tablet Take 10 mg by mouth at bedtime.    . triamterene-hydrochlorothiazide (MAXZIDE-25) 37.5-25 MG tablet Take 1 tablet by  mouth daily.    . umeclidinium-vilanterol (ANORO ELLIPTA) 62.5-25 MCG/INH AEPB Inhale 1 puff daily 1 each 5   No facility-administered medications prior to visit.     PAST MEDICAL HISTORY: Past Medical History:  Diagnosis Date  . BRCA2 positive 2017  . Diverticulosis   . Hearing loss   . HTN (hypertension)   . Memory loss   . 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 Seizures (HCC)   . Stroke, hemorrhagic (HCC) aneurysm bleed.   . Vestibulitis of ear     PAST SURGICAL HISTORY: Past Surgical History:  Procedure Laterality Date  . broken ankle Right   . CEREBRAL ANEURYSM REPAIR    . TONSILLECTOMY      FAMILY HISTORY: Family History  Problem Relation Age of Onset  . Dementia Mother   . Stroke Mother   . Thyroid disease Mother   . Osteoporosis Mother   . Ovarian cancer Paternal Grandmother 62  . Ovarian cancer Paternal Aunt 70  . Breast cancer Daughter 38       breast ca x2, bilateral dx. 38, 46--estrogen  . Other Daughter        no genetic testing as of 07/2015; has had ovaries removed  . Other Daughter        BRCA 2 Pos.  . Colon cancer Maternal Aunt        dx. unspecified age  . Emphysema Maternal Uncle   . High blood pressure Maternal Grandmother   . Dementia Maternal Grandfather   . Bleeding Disorder Maternal Grandfather   . Emphysema Maternal Aunt   . Emphysema Maternal Aunt     SOCIAL HISTORY: Social History   Socioeconomic History  . Marital status: Married    Spouse name: [name]  . Number of children: 2  . Years of education: 12  . Highest education level: Not on file  Occupational History  . Not on file  Social Needs  . Financial resource strain: Not on file  . Food insecurity:    Worry: Not on file    Inability: Not on file  . Transportation needs:    Medical: Not on file    Non-medical: Not on file  Tobacco Use  . Smoking status: Former Smoker    Packs/day: 1.00    Years: 41.00     Pack years: 41.00    Last attempt to quit: 01/13/2006      Years since quitting: 11.2  . Smokeless tobacco: Never Used  Substance and Sexual Activity  . Alcohol use: No    Alcohol/week: 0.0 oz  . Drug use: No  . Sexual activity: Never    Partners: Male    Birth control/protection: Post-menopausal  Lifestyle  . Physical activity:    Days per week: Not on file    Minutes per session: Not on file  . Stress: Not on file  Relationships  . Social connections:    Talks on phone: Not on file    Gets together: Not on file    Attends religious service: Not on file    Active member of club or organization: Not on file    Attends meetings of clubs or organizations: Not on file    Relationship status: Not on file  . Intimate partner violence:    Fear of current or ex partner: Not on file    Emotionally abused: Not on file    Physically abused: Not on file    Forced sexual activity: Not on file  Other Topics Concern  . Not on file  Social History Narrative   Patient is married ([name]) and lives at home with her husband.   Patient is retired.   Patient has two children.   Patient has a high school education.   Patient is right-handed.   Patient drinks 2 big mugs of coffee daily and sometimes tea in the evenings.      PHYSICAL EXAM  Vitals:   04/13/17 1431  BP: 118/67  Pulse: (!) 56  Weight: 227 lb (103 kg)  Height: 5' 4" (1.626 m)   Body mass index is 38.96 kg/m.   MOCA 04-13-2017-  24/ 30 Today .   Montreal Cognitive Assessment  04/08/2016 10/10/2015 10/11/2014  Visuospatial/ Executive (0/5) _0 Naming (0/3) _1 Attention: Read list of digits (0/2) _2 Attention: Read list of letters (0/1) _3 Attention: Serial 7 subtraction starting at 100 (0/3) _4 Language: Repeat phrase (0/2) _5 Language : Fluency (0/1) 0 1 0  Abstraction (0/2) _6 Delayed Recall (0/5) _7 Orientation (0/6) _8 Total _9 Adjusted Score (based on education) 26 - 27      Generalized: Well developed, in no acute distress   Neurological examination  Mentation: Alert oriented to time, place, history taking. Follows all commands speech and language fluent Cranial nerve : loss of taste and smell- Pupils were equal in size, round  And reactive to light. Extraocular movements were full, visual field were full on confrontational test. Facial sensation and strength were normal. Uvula  And tongue midline. Head turning and shoulder shrug  were normal and symmetric. Motor:  5 / 5- soft touch on all 4 extremities. No evidence of extinction is noted.  Coordination: Cerebellar testing reveals good finger-nose-finger and heel-to-shin bilaterally.  Gait and station: Gait deferred  Reflexes: Deep tendon reflexes are symmetric bilaterally.   DIAGNOSTIC DATA (LABS, IMAGING, TESTING) - I reviewed patient records, labs, notes, testing and imaging myself where available.  Lab Results  Component Value Date   WBC 9.1 07/30/2015   HGB 14.7 07/30/2015   HCT 43.0 07/30/2015   MCV 95.3 07/30/2015   PLT 247 07/30/2015      Component Value Date/Time   NA 134 (L) 07/30/2015 2103   K 3.3 (L) 07/30/2015 2103  CL 97 (L) 07/30/2015 2103   CO2 27 07/30/2015 2103   GLUCOSE 176 (H) 07/30/2015 2103   BUN 11 07/30/2015 2103   CREATININE 0.80 07/30/2015 2103   CALCIUM 9.3 07/30/2015 2103   PROT 7.1 08/18/2014 1522   ALBUMIN 3.7 08/18/2014 1522   AST 22 08/18/2014 1522   ALT 19 08/18/2014 1522   ALKPHOS 46 08/18/2014 1522   BILITOT 0.6 08/18/2014 1522   GFRNONAA >60 07/30/2015 2103   GFRAA >60 07/30/2015 2103       ASSESSMENT AND PLAN 76 y.o. year old female  has a past medical history of BRCA2 positive (2017), Diverticulosis, Hearing loss, HTN (hypertension), Memory loss, Seizure disorder, complex partial, 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HCC) (seizure free for one year), Seizures (HCC), Stroke, hemorrhagic (HCC) (aneurysm bleed. ), and Vestibulitis of ear. here with:   1.  Seizures, controlled on Keppra  2. 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 MOCA now 24/ 30 points.   Overall the patient is doing well. She'll continue on Keppra 750 mg twice a day. T he patient's memory score has remained stable.  We will continue to monitor her MOCA tests  Patient advised that if her symptoms worsen or she develops any new symptoms she should let us know. Follow-up in 6 months with Np.    , MD  04/13/2017, 3:17 PM [facility] [address], [address] [place], [place] [address] [phone]   

## 2017-04-21 ENCOUNTER — Ambulatory Visit
Admission: RE | Admit: 2017-04-21 | Discharge: 2017-04-21 | Disposition: A | Discharge status: Home / Self Care | Payer: Medicare Other | Source: Ambulatory Visit | Attending: Neurology | Admitting: Neurology

## 2017-04-21 DIAGNOSIS — G40409 Other generalized epilepsy and epileptic syndromes, not intractable, without status epilepticus: Secondary | ICD-10-CM | POA: Diagnosis not present

## 2017-04-21 DIAGNOSIS — F09 Unspecified mental disorder due to known physiological condition: Secondary | ICD-10-CM

## 2017-04-21 DIAGNOSIS — H1131 Conjunctival hemorrhage, right eye: Secondary | ICD-10-CM | POA: Diagnosis not present

## 2017-04-21 DIAGNOSIS — G3184 Mild cognitive impairment, so stated: Secondary | ICD-10-CM

## 2017-04-21 DIAGNOSIS — I609 Nontraumatic subarachnoid hemorrhage, unspecified: Secondary | ICD-10-CM

## 2017-04-28 ENCOUNTER — Telehealth: Payer: Self-pay | Admitting: Neurology

## 2017-04-28 NOTE — Telephone Encounter (Signed)
I have called the patient and there was no answer. LVM for the pt to call back

## 2017-04-28 NOTE — Telephone Encounter (Signed)
Pt returned call and I was able to go over the MRI results with the patient. Pt verbalized understanding. Pt does have an apt already scheduled in October. Pt verbalized understanding. Pt had no questions at this time but was encouraged to call back if questions arise.

## 2017-04-28 NOTE — Telephone Encounter (Signed)
Patient was seen by Dr. Brett Fairy for seizure, memory loss,  Please call patient MRI of the brain showed evidence of scar at right temporal lobe, most consistent with previous hemorrhagic stroke,  There is also evidence of supratentorium small vessel disease,   IMPRESSION:  Abnormal MRI scan of the brain showing large area of remote age encephalomalacia with gliosis and hemorrhagic blood products in the right temporal lobe likely sequelae of her prior hemorrhagic infarct. There are mild age-appropriate changes of chronic microvascular ischemia. There are incidental changes of mild chronic paranasal sinusitis with mucous retention cyst/polyp in the left maxillary antrum.There are no acute abnormalities. INTERPRETING PHYSICIAN:  Antony Contras, MD

## 2017-04-29 DIAGNOSIS — J9611 Chronic respiratory failure with hypoxia: Secondary | ICD-10-CM | POA: Diagnosis not present

## 2017-04-29 DIAGNOSIS — Z6838 Body mass index (BMI) 38.0-38.9, adult: Secondary | ICD-10-CM | POA: Diagnosis not present

## 2017-04-29 DIAGNOSIS — F3341 Major depressive disorder, recurrent, in partial remission: Secondary | ICD-10-CM | POA: Diagnosis not present

## 2017-04-29 DIAGNOSIS — I10 Essential (primary) hypertension: Secondary | ICD-10-CM | POA: Diagnosis not present

## 2017-04-29 DIAGNOSIS — Z1509 Genetic susceptibility to other malignant neoplasm: Secondary | ICD-10-CM | POA: Diagnosis not present

## 2017-04-29 DIAGNOSIS — Z0000 Encounter for general adult medical examination without abnormal findings: Secondary | ICD-10-CM | POA: Diagnosis not present

## 2017-04-29 DIAGNOSIS — R569 Unspecified convulsions: Secondary | ICD-10-CM | POA: Diagnosis not present

## 2017-04-29 DIAGNOSIS — M2550 Pain in unspecified joint: Secondary | ICD-10-CM | POA: Diagnosis not present

## 2017-04-29 DIAGNOSIS — J449 Chronic obstructive pulmonary disease, unspecified: Secondary | ICD-10-CM | POA: Diagnosis not present

## 2017-04-29 DIAGNOSIS — R7303 Prediabetes: Secondary | ICD-10-CM | POA: Diagnosis not present

## 2017-04-29 DIAGNOSIS — Z8679 Personal history of other diseases of the circulatory system: Secondary | ICD-10-CM | POA: Diagnosis not present

## 2017-04-29 DIAGNOSIS — Z1389 Encounter for screening for other disorder: Secondary | ICD-10-CM | POA: Diagnosis not present

## 2017-04-29 DIAGNOSIS — E7849 Other hyperlipidemia: Secondary | ICD-10-CM | POA: Diagnosis not present

## 2017-04-29 DIAGNOSIS — F329 Major depressive disorder, single episode, unspecified: Secondary | ICD-10-CM | POA: Diagnosis not present

## 2017-04-29 DIAGNOSIS — G3184 Mild cognitive impairment, so stated: Secondary | ICD-10-CM | POA: Diagnosis not present

## 2017-05-18 ENCOUNTER — Other Ambulatory Visit: Payer: Self-pay | Admitting: Neurology

## 2017-05-18 ENCOUNTER — Telehealth: Payer: Self-pay | Admitting: *Deleted

## 2017-05-18 DIAGNOSIS — G40409 Other generalized epilepsy and epileptic syndromes, not intractable, without status epilepticus: Secondary | ICD-10-CM

## 2017-05-18 DIAGNOSIS — I609 Nontraumatic subarachnoid hemorrhage, unspecified: Secondary | ICD-10-CM

## 2017-05-18 DIAGNOSIS — F09 Unspecified mental disorder due to known physiological condition: Secondary | ICD-10-CM

## 2017-05-18 MED ORDER — CLONAZEPAM 2 MG PO TABS: 2.0000 mg | ORAL_TABLET | Freq: Every day | ORAL | 1 refills | Status: DC

## 2017-05-18 NOTE — Telephone Encounter (Signed)
Patients husband stopped by the office to request a refill of Clonazepam.  Please send to [facility].  Call if questions.

## 2017-05-18 NOTE — Telephone Encounter (Signed)
I have forwarded to Dr Brett Fairy for review

## 2017-06-03 ENCOUNTER — Ambulatory Visit
Admission: RE | Admit: 2017-06-03 | Discharge: 2017-06-03 | Disposition: A | Discharge status: Home / Self Care | Payer: Medicare Other | Source: Ambulatory Visit | Attending: Oncology | Admitting: Oncology

## 2017-06-03 DIAGNOSIS — Z1231 Encounter for screening mammogram for malignant neoplasm of breast: Secondary | ICD-10-CM

## 2017-06-03 DIAGNOSIS — Z1501 Genetic susceptibility to malignant neoplasm of breast: Secondary | ICD-10-CM

## 2017-06-03 DIAGNOSIS — Z1509 Genetic susceptibility to other malignant neoplasm: Secondary | ICD-10-CM

## 2017-06-05 ENCOUNTER — Telehealth: Payer: Self-pay | Admitting: Oncology

## 2017-06-05 NOTE — Telephone Encounter (Signed)
GM PAL 5/28. Per GM f/u moved from [facility] to [facility] same date/time. Left message for patient re change.

## 2017-06-09 ENCOUNTER — Telehealth: Payer: Self-pay | Admitting: *Deleted

## 2017-06-09 ENCOUNTER — Inpatient Hospital Stay: Payer: Medicare Other | Attending: Adult Health | Admitting: Adult Health

## 2017-06-09 ENCOUNTER — Encounter: Payer: Self-pay | Admitting: Adult Health

## 2017-06-09 VITALS — BP 156/87 | HR 70 | Temp 98.0°F | Resp 18 | Ht 64.0 in | Wt 230.8 lb

## 2017-06-09 DIAGNOSIS — Z87891 Personal history of nicotine dependence: Secondary | ICD-10-CM | POA: Diagnosis not present

## 2017-06-09 DIAGNOSIS — J449 Chronic obstructive pulmonary disease, unspecified: Secondary | ICD-10-CM | POA: Diagnosis not present

## 2017-06-09 DIAGNOSIS — Z1231 Encounter for screening mammogram for malignant neoplasm of breast: Secondary | ICD-10-CM

## 2017-06-09 DIAGNOSIS — Z9981 Dependence on supplemental oxygen: Secondary | ICD-10-CM | POA: Insufficient documentation

## 2017-06-09 DIAGNOSIS — Z1509 Genetic susceptibility to other malignant neoplasm: Secondary | ICD-10-CM | POA: Diagnosis not present

## 2017-06-09 DIAGNOSIS — Z8041 Family history of malignant neoplasm of ovary: Secondary | ICD-10-CM | POA: Insufficient documentation

## 2017-06-09 DIAGNOSIS — Z1501 Genetic susceptibility to malignant neoplasm of breast: Secondary | ICD-10-CM | POA: Insufficient documentation

## 2017-06-09 DIAGNOSIS — Z803 Family history of malignant neoplasm of breast: Secondary | ICD-10-CM | POA: Diagnosis not present

## 2017-06-09 NOTE — Telephone Encounter (Signed)
Patient stopped by and made an appt to see Dr. Gerarda Fraction. (former Dr. Alycia Rossetti patient)

## 2017-06-09 NOTE — Progress Notes (Signed)
[facility]  [phone] [phone]     ID: Beth [name] DOB: 07/06/1940  MR#: [id number]  CSN#:[id number]  Patient Care Team: Miller, Lisa, MD as PCP - General (Family Medicine) Magrinat, Gustav C, MD as Consulting Physician (Oncology) Nishan, Peter C, MD as Consulting Physician (Cardiology) Alva, Rakesh V, MD as Consulting Physician (Pulmonary Disease) Dohmeier, Carmen, MD as Consulting Physician (Neurology) Jordan, Amy, MD as Consulting Physician (Dermatology) Wainer, Robert, MD as Consulting Physician (Orthopedic Surgery) Ganem, Salem F, MD as Consulting Physician (Gastroenterology) Rossi, Emma, MD as Consulting Physician (Obstetrics and Gynecology) OTHER MD:  CHIEF COMPLAINT: 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 Virginia subsequent had a second cancer which she also has survived. On the father's side the patient has little information except that her paternal grandmother had ovarian cancer and one of the paternal aunts also had ovarian cancer, both of them dying in their 50s.  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  She was referred for consideration of risk reducon and intensified screening strategies.  INTERVAL HISTORY: Beth [name] returns today for a follow-up of her BRCA 2 positivity.  We are following her with mammography and MRI yearly, separated every 6 months.  She also sees the gynecology oncology group here.  They have set her up for lab work and pelvic ultrasound every 6 months.  She is going to re establish with Dr. Phelps next month.    REVIEW OF SYSTEMS: Beth [name] is doing  well. She is on oxygen 24/7 for her COPD.  She says that her oxygen requirement is still at her baseline.  She denies any issues today.  She most importantly denies any breast changes such as lumps, masses, nipple changes, skin changes, or discharge.  A detailed ROS was conducted today and is non contributory.     PAST MEDICAL HISTORY: Past Medical History:  Diagnosis Date  . BRCA2 positive 2017  . Diverticulosis   . Hearing loss   . HTN (hypertension)   . Memory loss   . 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 Seizures (HCC)   . Stroke, hemorrhagic (HCC) aneurysm bleed.   . Vestibulitis of ear     PAST SURGICAL HISTORY: Past Surgical History:  Procedure Laterality Date  . broken ankle Right   . CEREBRAL ANEURYSM REPAIR    . TONSILLECTOMY      FAMILY HISTORY Family History  Problem Relation Age of Onset  . Dementia Mother   . Stroke Mother   . Thyroid disease Mother   . Osteoporosis Mother   . Ovarian cancer Paternal Grandmother 62  . Ovarian cancer Paternal Aunt 70  . Breast cancer Daughter 38       breast ca x2, bilateral dx. 38, 46--estrogen  . Other Daughter        no genetic testing as of 07/2015; has had ovaries removed  . Other Daughter        BRCA 2 Pos.  . Colon cancer Maternal Aunt        dx. unspecified age  . Emphysema Maternal Uncle   . High blood pressure Maternal Grandmother   .   Dementia Maternal Grandfather   . Bleeding Disorder Maternal Grandfather   . Emphysema Maternal Aunt   . Emphysema Maternal Aunt     GYNECOLOGIC HISTORY:  Patient's last menstrual period was 01/14/1995 (approximate). Menarche age 13, first live birth age 19. The patient is GX to. She went through menopause in her mid 40s, taking hormone replacement for less than a year  SOCIAL HISTORY:  She taught preschool and worked in sales. Her husband [name] is a retired aviation electrician. He also worked for the [employer]. At home is just the 2 of them plus their dog. The patient's daughter, Virginia has not yet been tested for the BRCA gene. The patient's younger daughter Wendy is BRCA2 positive and she has undergone a total abdominal hysterectomy with bilateral salpingo-oophorectomy and also bilateral mastectomies.    ADVANCED DIRECTIVES: The patient's husband is her healthcare power of attorney     HEALTH MAINTENANCE: Social History   Tobacco Use  . Smoking status: Former Smoker    Packs/day: 1.00    Years: 41.00    Pack years: 41.00    Last attempt to quit: 01/13/2006    Years since quitting: 11.4  . Smokeless tobacco: Never Used  Substance Use Topics  . Alcohol use: No    Alcohol/week: 0.0 oz  . Drug use: No     Colonoscopy: Ganem  PAP:  Bone density: "normal"   Allergies  Allergen Reactions  . Prozac [Fluoxetine Hcl] Other (See Comments)    seizures  . Sulfa Antibiotics Nausea And Vomiting and Other (See Comments)    TINGLING IN LEGS ALSO  . Micardis [Telmisartan] Swelling and Other (See Comments)    Swelling, bruising Swelling, bruising  . Prednisone Other (See Comments)    mood changes Mood changes  . Lisinopril-Hydrochlorothiazide Other (See Comments)    unknown  . Augmentin [Amoxicillin-Pot Clavulanate] Rash  . Avelox [Moxifloxacin Hcl In Nacl] Rash    LEVAQUIN IS OK-SEE NOTE  . Norvasc [Amlodipine Besylate] Other (See Comments)    fatigue    Current Outpatient Medications  Medication Sig Dispense Refill  . albuterol (PROVENTIL HFA;VENTOLIN HFA) 108 (90 Base) MCG/ACT inhaler Inhale 2 puffs into the lungs every 4 (four) hours as needed for wheezing or shortness of breath. 1 Inhaler 5  . aspirin EC 81 MG tablet Take 81 mg by mouth daily.    . atenolol (TENORMIN) 50 MG tablet TAKE 1/2 TABLET BY MOUTH TWICE A DAY    . captopril (CAPOTEN) 50 MG tablet Take 50 mg by mouth 2 (two) times daily.    . Cholecalciferol (VITAMIN D3) 2000 units capsule 1 tablet by mouth daily     . clonazePAM (KLONOPIN) 2 MG tablet Take 1 tablet (2 mg total) by mouth at bedtime. 90 tablet 1  . fexofenadine (ALLEGRA) 180 MG tablet Take 180 mg by mouth daily as needed.     . fluticasone (FLONASE) 50 MCG/ACT nasal spray Place 2 sprays into the nose daily as needed for allergies. Spray twice daily as needed    . guaiFENesin (MUCINEX) 600 MG 12 hr tablet Take 1,200 mg by mouth 2 (two) times daily as needed for cough.     . KEPPRA 750 MG tablet Take 1 tablet (750 mg total) by mouth 2 (two) times daily. 180 tablet 3  . LEXAPRO 20 MG tablet Take 1 tablet (20 mg total) by mouth daily. Morning dosage 90 tablet 3  . meloxicam (MOBIC) 15 MG tablet 1 tablet as needed    .   simvastatin (ZOCOR) 10 MG tablet Take 10 mg by mouth at bedtime.    . triamterene-hydrochlorothiazide (MAXZIDE-25) 37.5-25 MG tablet Take 1 tablet by mouth daily.    . umeclidinium-vilanterol (ANORO ELLIPTA) 62.5-25 MCG/INH AEPB Inhale 1 puff daily 1 each 5   No current facility-administered medications for this visit.     OBJECTIVE: Vitals:   06/09/17 1540  BP: (!) 156/87  Pulse: 70  Resp: 18  Temp: 98 F (36.7 C)  SpO2: 91%     Body mass index is 39.62 kg/m.    ECOG FS:2 - Symptomatic, <50% confined to bed GENERAL: Patient is a well appearing female in no acute distress HEENT:  Sclerae anicteric.  Oropharynx clear and moist. No ulcerations or evidence of oropharyngeal candidiasis. Neck is supple.  NODES:  No cervical, supraclavicular, or axillary lymphadenopathy palpated.  BREAST EXAM:  Bilateral breasts are without nodules, masses, skin or nipple changes. LUNGS:  Clear to auscultation bilaterally.  No wheezes or rhonchi. HEART:  Regular rate and rhythm. No murmur appreciated. ABDOMEN:  Soft, nontender.  Positive, normoactive bowel sounds. No organomegaly palpated. MSK:  No focal spinal tenderness to palpation. Full range of motion bilaterally in the upper extremities. EXTREMITIES:  No peripheral edema.   SKIN:   Clear with no obvious rashes or skin changes. No nail dyscrasia. NEURO:  Nonfocal. Well oriented.  Appropriate affect.    LAB RESULTS:  CMP     Component Value Date/Time   NA 134 (L) 07/30/2015 2103   K 3.3 (L) 07/30/2015 2103   CL 97 (L) 07/30/2015 2103   CO2 27 07/30/2015 2103   GLUCOSE 176 (H) 07/30/2015 2103   BUN 11 07/30/2015 2103   CREATININE 0.80 07/30/2015 2103   CALCIUM 9.3 07/30/2015 2103   PROT 7.1 08/18/2014 1522   ALBUMIN 3.7 08/18/2014 1522   AST 22 08/18/2014 1522   ALT 19 08/18/2014 1522   ALKPHOS 46 08/18/2014 1522   BILITOT 0.6 08/18/2014 1522   GFRNONAA >60 07/30/2015 2103   GFRAA >60 07/30/2015 2103    No results found for: TOTALPROTELP, ALBUMINELP, A1GS, A2GS, BETS, BETA2SER, GAMS, MSPIKE, SPEI  No results found for: KPAFRELGTCHN, LAMBDASER, KAPLAMBRATIO  Lab Results  Component Value Date   WBC 9.1 07/30/2015   NEUTROABS 6.0 06/10/2008   HGB 14.7 07/30/2015   HCT 43.0 07/30/2015   MCV 95.3 07/30/2015   PLT 247 07/30/2015      Chemistry      Component Value Date/Time   NA 134 (L) 07/30/2015 2103   K 3.3 (L) 07/30/2015 2103   CL 97 (L) 07/30/2015 2103   CO2 27 07/30/2015 2103   BUN 11 07/30/2015 2103   CREATININE 0.80 07/30/2015 2103      Component Value Date/Time   CALCIUM 9.3 07/30/2015 2103   ALKPHOS 46 08/18/2014 1522   AST 22 08/18/2014 1522   ALT 19 08/18/2014 1522   BILITOT 0.6 08/18/2014 1522       No results found for: LABCA2  No components found for: LABCAN125  No results for input(s): INR in the last 168 hours.  Urinalysis    Component Value Date/Time   COLORURINE YELLOW 08/18/2014 1742   APPEARANCEUR CLEAR 08/18/2014 1742   LABSPEC 1.006 08/18/2014 1742   PHURINE 6.5 08/18/2014 1742   GLUCOSEU NEGATIVE 08/18/2014 1742   HGBUR TRACE (A) 08/18/2014 1742   BILIRUBINUR NEGATIVE 08/18/2014 1742   KETONESUR NEGATIVE 08/18/2014 1742   PROTEINUR NEGATIVE 08/18/2014 1742   UROBILINOGEN 0.2 08/18/2014 1742      NITRITE NEGATIVE 08/18/2014 1742   LEUKOCYTESUR TRACE (A) 08/18/2014 1742     STUDIES: Mm Screening Breast Tomo Bilateral  Result Date: 06/03/2017 CLINICAL DATA:  Screening. EXAM: DIGITAL SCREENING BILATERAL MAMMOGRAM WITH TOMO AND CAD COMPARISON:  Previous exam(s). ACR Breast Density Category a: The breast tissue is almost entirely fatty. FINDINGS: There are no findings suspicious for malignancy. Images were processed with CAD. IMPRESSION: No mammographic evidence of malignancy. A result letter of this screening mammogram will be mailed directly to the patient. RECOMMENDATION: Screening mammogram in one year. (Code:SM-B-01Y) BI-RADS CATEGORY  1: Negative. Electronically Signed   By: Curlene Dolphin M.D.   On: 06/03/2017 16:26    ELIGIBLE FOR AVAILABLE RESEARCH PROTOCOL: No  ASSESSMENT: 77 y.o. BRCA2 positive Beth [name] woman at increased risk for breast and ovarian cancer  (a) the specific BRCA2 mutations is [id number]).   (1) consider anastrozole for breast cancer risk reduction  (a) DEXA scan 01/16/2014 normal, with a T score of 0.7  (2) yearly MRI in addition to yearly mammography, 6 months apart, for intensified breast cancer screening  (3) unable to undergo bilateral salpingo-oophorectomy--followed by gynecological oncology with biannual pelvic ultrasound and Ca-125 determinations  PLAN: Beth [name] is doing well today.  Her last mammogram was on 06/03/2017 and was normal.  She will continue with alternating mammogram and MRI every 6 months and see [place] following these tests. I have ordered her MRI in 6 months.  She wants this done at [facility].    Beth [name] will also see Dr. Gerarda Fraction in gyn-oncology for continued follow up since she is also at increased risk for ovarian cancer.  We will see her back in 6 months time.  She knows to call for any questions or concerns prior to her next appointment with [place].    A total of (20) minutes of face-to-face time was spent with  this patient with greater than 50% of that time in counseling and care-coordination.  Wilber Bihari, NP 06/09/17 4:18 PM Medical Oncology and Hematology [facility] [address] [place], [place] [address] Tel. [phone]    Fax. [phone]

## 2017-06-15 ENCOUNTER — Encounter: Payer: Self-pay | Admitting: Adult Health

## 2017-06-15 ENCOUNTER — Telehealth: Payer: Self-pay | Admitting: *Deleted

## 2017-06-15 ENCOUNTER — Ambulatory Visit (INDEPENDENT_AMBULATORY_CARE_PROVIDER_SITE_OTHER): Payer: Medicare Other | Admitting: Adult Health

## 2017-06-15 DIAGNOSIS — J449 Chronic obstructive pulmonary disease, unspecified: Secondary | ICD-10-CM | POA: Diagnosis not present

## 2017-06-15 DIAGNOSIS — J018 Other acute sinusitis: Secondary | ICD-10-CM | POA: Diagnosis not present

## 2017-06-15 MED ORDER — DOXYCYCLINE HYCLATE 100 MG PO TABS: 100.0000 mg | ORAL_TABLET | Freq: Two times a day (BID) | ORAL | 0 refills | Status: DC

## 2017-06-15 NOTE — Patient Instructions (Signed)
Doxycycline 100mg  Twice daily  For 1 week , take with food.  Mucinex DM Twice daily  As needed  Cough /congestion  Saline nasal spray As needed   Allegra daily As needed   Fluids and rest  Follow up with Dr. Elsworth Soho  In 3 months and As needed   Please contact office for sooner follow up if symptoms do not improve or worsen or seek emergency care

## 2017-06-15 NOTE — Progress Notes (Signed)
'@Patient'  ID: Beth [name], female    DOB: 1940/04/21, 77 y.o.   MRN: [id number]  Chief Complaint  Patient presents with  . Acute Visit    COPD     Referring provider: Kathyrn Lass, MD  HPI: 77 year old female former smoker followed for COPD  Significant tests/ events  CT angio chest 07/2015 >>no pulmonary embolism, no emphysema  2010 head CT >>polyps or mucous retention cysts seen within the maxillary sinuses bilaterally  PFTs 09/2015>>severe airway obstruction with FEV1 40%, improves to 47% with albuterol,DLCO decreased to 45% Finished [facility] 2018 .   06/15/2017 Acute OV : COPD  Patient presents for an acute office visit.  She complains of 5 days of increased cough congestion shortness of breath and wheezing.Several family members have similar symptoms .  She is taking allegra . Has a lot of nasal drainage . Coughing up thick mucus . Has sore throat .  Remains on Anoro daily    Allergies  Allergen Reactions  . Prozac [Fluoxetine Hcl] Other (See Comments)    seizures  . Sulfa Antibiotics Nausea And Vomiting and Other (See Comments)    TINGLING IN LEGS ALSO  . Micardis [Telmisartan] Swelling and Other (See Comments)    Swelling, bruising Swelling, bruising  . Prednisone Other (See Comments)    mood changes Mood changes  . Lisinopril-Hydrochlorothiazide Other (See Comments)    unknown  . Augmentin [Amoxicillin-Pot Clavulanate] Rash  . Avelox [Moxifloxacin Hcl In Nacl] Rash    LEVAQUIN IS OK-SEE NOTE  . Norvasc [Amlodipine Besylate] Other (See Comments)    fatigue    Immunization History  Administered Date(s) Administered  . Influenza-Unspecified 10/08/2015    Past Medical History:  Diagnosis Date  . BRCA2 positive 2017  . Diverticulosis   . Hearing loss   . HTN (hypertension)   . Memory loss   . Seizure disorder, complex partial, 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[place]) seizure free for one year  . Seizures ([place])   . Stroke, hemorrhagic ([place])  aneurysm bleed.   . Vestibulitis of ear     Tobacco History: Social History   Tobacco Use  Smoking Status Former Smoker  . Packs/day: 1.00  . Years: 41.00  . Pack years: 41.00  . Last attempt to quit: 01/13/2006  . Years since quitting: 11.4  Smokeless Tobacco Never Used   Counseling given: Not Answered   Outpatient Encounter Medications as of 06/15/2017  Medication Sig  . albuterol (PROVENTIL HFA;VENTOLIN HFA) 108 (90 Base) MCG/ACT inhaler Inhale 2 puffs into the lungs every 4 (four) hours as needed for wheezing or shortness of breath.  Marland Kitchen aspirin EC 81 MG tablet Take 81 mg by mouth daily.  Marland Kitchen atenolol (TENORMIN) 50 MG tablet TAKE 1/2 TABLET BY MOUTH TWICE A DAY  . captopril (CAPOTEN) 50 MG tablet Take 50 mg by mouth 2 (two) times daily.  . Cholecalciferol (VITAMIN D3) 2000 units capsule 1 tablet by mouth daily  . clonazePAM (KLONOPIN) 2 MG tablet Take 1 tablet (2 mg total) by mouth at bedtime.  . fexofenadine (ALLEGRA) 180 MG tablet Take 180 mg by mouth daily as needed.   . fluticasone (FLONASE) 50 MCG/ACT nasal spray Place 2 sprays into the nose daily as needed for allergies. Spray twice daily as needed  . guaiFENesin (MUCINEX) 600 MG 12 hr tablet Take 1,200 mg by mouth 2 (two) times daily as needed for cough.   Marland Kitchen LEXAPRO 20 MG tablet Take 1 tablet (20 mg total) by mouth daily. Morning  dosage  . meloxicam (MOBIC) 15 MG tablet 1 tablet as needed  . simvastatin (ZOCOR) 10 MG tablet Take 10 mg by mouth at bedtime.  . triamterene-hydrochlorothiazide (MAXZIDE-25) 37.5-25 MG tablet Take 1 tablet by mouth daily.  Marland Kitchen umeclidinium-vilanterol (ANORO ELLIPTA) 62.5-25 MCG/INH AEPB Inhale 1 puff daily  . doxycycline (VIBRA-TABS) 100 MG tablet Take 1 tablet (100 mg total) by mouth 2 (two) times daily.  Marland Kitchen KEPPRA 750 MG tablet Take 1 tablet (750 mg total) by mouth 2 (two) times daily. (Patient not taking: Reported on 06/15/2017)   No facility-administered encounter medications on file as of 06/15/2017.       Review of Systems  Constitutional:   No  weight loss, night sweats,  Fevers, chills,  +fatigue, or  lassitude.  HEENT:   No headaches,  Difficulty swallowing,  Tooth/dental problems, or  Sore throat,                No sneezing, itching, ear ache,  +nasal congestion, post nasal drip,   CV:  No chest pain,  Orthopnea, PND, swelling in lower extremities, anasarca, dizziness, palpitations, syncope.   GI  No heartburn, indigestion, abdominal pain, nausea, vomiting, diarrhea, change in bowel habits, loss of appetite, bloody stools.   Resp:  No chest wall deformity  Skin: no rash or lesions.  GU: no dysuria, change in color of urine, no urgency or frequency.  No flank pain, no hematuria   MS:  No joint pain or swelling.  No decreased range of motion.  No back pain.    Physical Exam  BP 112/72 (BP Location: Left Arm, Cuff Size: Normal)   Pulse 79   Ht '5\' 4"'  (1.626 m)   Wt 226 lb (102.5 kg)   LMP 01/14/1995 (Approximate)   SpO2 95%   BMI 38.79 kg/m   GEN: A/Ox3; pleasant , NAD, obese, on O2    HEENT:  [place]/AT,  EACs-clear, TMs-wnl, NOSE-clear, THROAT-clear, no lesions, no postnasal drip or exudate noted.   NECK:  Supple w/ fair ROM; no JVD; normal carotid impulses w/o bruits; no thyromegaly or nodules palpated; no lymphadenopathy.    RESP  Decreased BS in bases ,  no accessory muscle use, no dullness to percussion  CARD:  RRR, no m/r/g, no peripheral edema, pulses intact, no cyanosis or clubbing.  GI:   Soft & nt; nml bowel sounds; no organomegaly or masses detected.   Musco: Warm bil, no deformities or joint swelling noted.   Neuro: alert, no focal deficits noted.    Skin: Warm, no lesions or rashes    Lab Results:  CBC  BMET  BNP  ProBNP No results found for: PROBNP  Imaging:    Assessment & Plan:   COPD without exacerbation (HCC) Exacerbation with acute bronchitis  Plan  Patient Instructions  Doxycycline 157m Twice daily  For 1 week , take  with food.  Mucinex DM Twice daily  As needed  Cough /congestion  Saline nasal spray As needed   Allegra daily As needed   Fluids and rest  Follow up with Dr. AElsworth Soho In 3 months and As needed   Please contact office for sooner follow up if symptoms do not improve or worsen or seek emergency care       Acute sinusitis Acute rhinitis versus early sinusitis  Plan  Patient Instructions  Doxycycline 1034mTwice daily  For 1 week , take with food.  Mucinex DM Twice daily  As needed  Cough /congestion  Saline  nasal spray As needed   Allegra daily As needed   Fluids and rest  Follow up with Dr. Elsworth Soho  In 3 months and As needed   Please contact office for sooner follow up if symptoms do not improve or worsen or seek emergency care          Rexene Edison, NP 06/15/2017

## 2017-06-15 NOTE — Assessment & Plan Note (Signed)
Exacerbation with acute bronchitis  Plan  Patient Instructions  Doxycycline 100mg  Twice daily  For 1 week , take with food.  Mucinex DM Twice daily  As needed  Cough /congestion  Saline nasal spray As needed   Allegra daily As needed   Fluids and rest  Follow up with Dr. Elsworth Soho  In 3 months and As needed   Please contact office for sooner follow up if symptoms do not improve or worsen or seek emergency care

## 2017-06-15 NOTE — Telephone Encounter (Signed)
Patient called and stated "I would like to cancel my appt with Dr. Alycia Rossetti for June 14th. I am due for labs and [place] In August. I would like to wait until then since it's so close." Per Melissa APP ok to move appts. Explained to the patient that I would cancel her appt abnd call her back with the news appts soon.

## 2017-06-15 NOTE — Assessment & Plan Note (Signed)
Acute rhinitis versus early sinusitis  Plan  Patient Instructions  Doxycycline 100mg  Twice daily  For 1 week , take with food.  Mucinex DM Twice daily  As needed  Cough /congestion  Saline nasal spray As needed   Allegra daily As needed   Fluids and rest  Follow up with Dr. Elsworth Soho  In 3 months and As needed   Please contact office for sooner follow up if symptoms do not improve or worsen or seek emergency care

## 2017-06-17 ENCOUNTER — Other Ambulatory Visit: Payer: Self-pay | Admitting: Gynecologic Oncology

## 2017-06-17 DIAGNOSIS — R1030 Lower abdominal pain, unspecified: Secondary | ICD-10-CM

## 2017-06-17 DIAGNOSIS — Z1509 Genetic susceptibility to other malignant neoplasm: Secondary | ICD-10-CM

## 2017-06-17 DIAGNOSIS — M25559 Pain in unspecified hip: Secondary | ICD-10-CM

## 2017-06-17 DIAGNOSIS — Z1502 Genetic susceptibility to malignant neoplasm of ovary: Secondary | ICD-10-CM

## 2017-06-17 DIAGNOSIS — Z1501 Genetic susceptibility to malignant neoplasm of breast: Secondary | ICD-10-CM

## 2017-06-19 ENCOUNTER — Telehealth: Payer: Self-pay | Admitting: *Deleted

## 2017-06-19 NOTE — Telephone Encounter (Signed)
Called and scheduled [place] scan  appt for August. Called and spoke with the patient's husband regarding the appts August, explained the appts are in my chart

## 2017-06-22 ENCOUNTER — Telehealth: Payer: Self-pay | Admitting: *Deleted

## 2017-06-22 NOTE — Telephone Encounter (Signed)
Patient's husband called and reschedule the patient's appt for the morning to the afternoon.

## 2017-06-26 ENCOUNTER — Ambulatory Visit: Payer: Medicare Other | Admitting: Obstetrics

## 2017-07-10 ENCOUNTER — Telehealth: Payer: Self-pay | Admitting: Oncology

## 2017-07-10 NOTE — Telephone Encounter (Signed)
Appointment scheduled per 6/27 voicemail from patient/ Also this was per 06/09/17 los / 07/09/17

## 2017-08-14 ENCOUNTER — Other Ambulatory Visit: Payer: Self-pay | Admitting: *Deleted

## 2017-08-14 DIAGNOSIS — Z1501 Genetic susceptibility to malignant neoplasm of breast: Secondary | ICD-10-CM

## 2017-08-14 DIAGNOSIS — Z1509 Genetic susceptibility to other malignant neoplasm: Principal | ICD-10-CM

## 2017-08-17 ENCOUNTER — Inpatient Hospital Stay: Payer: Medicare Other | Attending: Adult Health

## 2017-08-17 ENCOUNTER — Ambulatory Visit (HOSPITAL_COMMUNITY)
Admission: RE | Admit: 2017-08-17 | Discharge: 2017-08-17 | Disposition: A | Discharge status: Home / Self Care | Payer: Medicare Other | Source: Ambulatory Visit | Attending: Gynecologic Oncology | Admitting: Gynecologic Oncology

## 2017-08-17 DIAGNOSIS — N859 Noninflammatory disorder of uterus, unspecified: Secondary | ICD-10-CM | POA: Insufficient documentation

## 2017-08-17 DIAGNOSIS — Z1509 Genetic susceptibility to other malignant neoplasm: Secondary | ICD-10-CM | POA: Diagnosis not present

## 2017-08-17 DIAGNOSIS — R102 Pelvic and perineal pain: Secondary | ICD-10-CM | POA: Insufficient documentation

## 2017-08-17 DIAGNOSIS — Z1502 Genetic susceptibility to malignant neoplasm of ovary: Secondary | ICD-10-CM

## 2017-08-17 DIAGNOSIS — R1030 Lower abdominal pain, unspecified: Secondary | ICD-10-CM | POA: Insufficient documentation

## 2017-08-17 DIAGNOSIS — M25559 Pain in unspecified hip: Secondary | ICD-10-CM | POA: Diagnosis not present

## 2017-08-17 DIAGNOSIS — Z803 Family history of malignant neoplasm of breast: Secondary | ICD-10-CM | POA: Diagnosis not present

## 2017-08-17 DIAGNOSIS — R978 Other abnormal tumor markers: Secondary | ICD-10-CM | POA: Diagnosis not present

## 2017-08-17 DIAGNOSIS — Z1501 Genetic susceptibility to malignant neoplasm of breast: Secondary | ICD-10-CM | POA: Insufficient documentation

## 2017-08-17 LAB — CMP (CANCER CENTER ONLY)
ALBUMIN: 3.8 g/dL (ref 3.5–5.0)
ALK PHOS: 48 U/L (ref 38–126)
ALT: 19 U/L (ref 0–44)
AST: 20 U/L (ref 15–41)
Anion gap: 11 (ref 5–15)
BILIRUBIN TOTAL: 0.4 mg/dL (ref 0.3–1.2)
BUN: 13 mg/dL (ref 8–23)
CALCIUM: 9.3 mg/dL (ref 8.9–10.3)
CO2: 27 mmol/L (ref 22–32)
Chloride: 100 mmol/L (ref 98–111)
Creatinine: 0.87 mg/dL (ref 0.44–1.00)
GFR, Est AFR Am: >60 mL/min (ref >60)
GFR, Estimated: >60 mL/min (ref >60)
GLUCOSE: 97 mg/dL (ref 70–99)
Potassium: 4.1 mmol/L (ref 3.5–5.1)
Sodium: 138 mmol/L (ref 135–145)
Total Protein: 7.5 g/dL (ref 6.5–8.1)

## 2017-08-17 LAB — CBC WITH DIFFERENTIAL (CANCER CENTER ONLY)
BASOS ABS: 0 10*3/uL (ref 0.0–0.1)
BASOS PCT: 0 %
EOS PCT: 1 %
Eosinophils Absolute: 0.1 10*3/uL (ref 0.0–0.5)
HCT: 38.1 % (ref 34.8–46.6)
Hemoglobin: 12.9 g/dL (ref 11.6–15.9)
Lymphocytes Relative: 18 %
Lymphs Abs: 1.5 10*3/uL (ref 0.9–3.3)
MCH: 32.8 pg (ref 25.1–34.0)
MCHC: 33.9 g/dL (ref 31.5–36.0)
MCV: 96.8 fL (ref 79.5–101.0)
Monocytes Absolute: 0.7 10*3/uL (ref 0.1–0.9)
Monocytes Relative: 9 %
Neutro Abs: 6 10*3/uL (ref 1.5–6.5)
Neutrophils Relative %: 72 %
Platelet Count: 233 10*3/uL (ref 145–400)
RBC: 3.94 MIL/uL (ref 3.70–5.45)
RDW: 13.8 % (ref 11.2–14.5)
WBC Count: 8.4 10*3/uL (ref 3.9–10.3)

## 2017-08-19 ENCOUNTER — Telehealth: Payer: Self-pay

## 2017-08-19 NOTE — Telephone Encounter (Signed)
Pt calling back as she received a messaged to call back for the results of the [place].  She has an appointment Friday with Dr. Gerarda Fraction and prefers to wait until then to review the results with the doctor.

## 2017-08-21 ENCOUNTER — Inpatient Hospital Stay: Payer: Medicare Other

## 2017-08-21 ENCOUNTER — Encounter: Payer: Self-pay | Admitting: Obstetrics

## 2017-08-21 ENCOUNTER — Inpatient Hospital Stay (HOSPITAL_BASED_OUTPATIENT_CLINIC_OR_DEPARTMENT_OTHER): Payer: Medicare Other | Admitting: Obstetrics

## 2017-08-21 VITALS — BP 144/69 | HR 58 | Temp 98.0°F | Resp 20 | Ht 64.0 in | Wt 228.1 lb

## 2017-08-21 DIAGNOSIS — R978 Other abnormal tumor markers: Secondary | ICD-10-CM

## 2017-08-21 DIAGNOSIS — Z1502 Genetic susceptibility to malignant neoplasm of ovary: Secondary | ICD-10-CM | POA: Diagnosis not present

## 2017-08-21 DIAGNOSIS — R102 Pelvic and perineal pain: Secondary | ICD-10-CM | POA: Diagnosis not present

## 2017-08-21 DIAGNOSIS — Z803 Family history of malignant neoplasm of breast: Secondary | ICD-10-CM | POA: Diagnosis not present

## 2017-08-21 DIAGNOSIS — N859 Noninflammatory disorder of uterus, unspecified: Secondary | ICD-10-CM | POA: Diagnosis not present

## 2017-08-21 DIAGNOSIS — Z1501 Genetic susceptibility to malignant neoplasm of breast: Secondary | ICD-10-CM

## 2017-08-21 DIAGNOSIS — Z1509 Genetic susceptibility to other malignant neoplasm: Secondary | ICD-10-CM

## 2017-08-21 DIAGNOSIS — N858 Other specified noninflammatory disorders of uterus: Secondary | ICD-10-CM

## 2017-08-21 NOTE — Patient Instructions (Signed)
1) Plan to have a CA 125 today.   2) Plan to have an ultrasound, CA 125, and office appointment with Dr. Gerarda Fraction in six months or sooner if needed.  3) We will refer you to Dr. [place] Pink at [facility] (she has office in [place]) for non-surgical options for rectocele and splinting to have BM.

## 2017-08-21 NOTE — Progress Notes (Signed)
[place] [facility]    Progress Note : Established Patient FOLLOW-UP  Beth [name] 77 y.o. female  CC:  Chief Complaint  Patient presents with  . BRCA2 positive    HPI:  She was initially seen in consultation by Dr. Alycia Rossetti for BRCA 2 gene positivity at the request of Dr. Jana Hakim. She opted for testing for BRCA mutation due to her strong family history of breast and ovarian cancer.  Of note, her daughter was found to be positive after being diagnosed with breast cancer.  Per history, her paternal grandmother had ovarian cancer age of 82 and her paternal aunt had ovarian cancer at the age of 93.  Past medical history includes COPD with being oxygen dependent 24 hours a day. She has also had a stroke which has caused her to have a seizure disorder which is fairly well managed on Kepra. She suffers from fairly significant short term memory loss per her report. She took OCPs for about 10-15 years total. She went through menopause in her late 37s and took HRT briefly but stopped with results from the nurses health study. She has never had any abdominal surgery. She had been having ultrasounds and CA-125s done with Dr. Quincy Simmonds.  She was initially seen for consideration of risk reducing bilateral salpingo-oophorectomy but it was determined that surgery was too risky after pulmonary and neurology input.  It was determined that surveillance ultrasounds and CA 125 levels performed every 6 months would be safer for the patient.    Interval History:  She presents today for follow up on two issues  BRCA2 surveillance imaging with ultrasound and CA125.   We reviewed the August 2019 TVUS report with no findings of concern. (Fibroid)  CA125 was not done and we plan to obtain today  Rectocele with need to splint  She tried the vaginal dilator to help with the splinting but that was minimally useful and she is now considering purchasing nonmedical devices to  help.  Radiology 09/2016 - TVUS ovaries not seen, no adnexal mass 02/2017 - TVUS ovaries not seen, no adnexal mass 08/2017 -  US Pelvis Transvanginal Non-ob (tv Only)  Result Date: 08/17/2017 CLINICAL DATA:  The patient is BRCA 2 positive. Ovarian cancer susceptibility. EXAM: TRANSABDOMINAL AND TRANSVAGINAL ULTRASOUND OF PELVIS TECHNIQUE: Both transabdominal and transvaginal ultrasound examinations of the pelvis were performed. Transabdominal technique was performed for global imaging of the pelvis including uterus, ovaries, adnexal regions, and pelvic cul-de-sac. It was necessary to proceed with endovaginal exam following the transabdominal exam to visualize the endometrium and ovaries. COMPARISON:  February 24, 2017 FINDINGS: Uterus Measurements: 5.2 x 2.2 x 4.4 cm. Small calcified region in the fundus measuring 8 x 5 x 4 mm, likely a calcified fibroid. Endometrium Thickness: 3 mm.  No focal abnormality visualized. Right ovary Not visualized. Left ovary Not visualized. Other findings No abnormal free fluid. IMPRESSION: 1. Probable small calcified fibroid in the fundus of the uterus. 2. Neither ovary was visualized. Electronically Signed   By: Dorise Bullion III M.D   On: 08/17/2017 14:25   US Pelvis (transabdominal Only)  Result Date: 08/17/2017 CLINICAL DATA:  The patient is BRCA 2 positive. Ovarian cancer susceptibility. EXAM: TRANSABDOMINAL AND TRANSVAGINAL ULTRASOUND OF PELVIS TECHNIQUE: Both transabdominal and transvaginal ultrasound examinations of the pelvis were performed. Transabdominal technique was performed for global imaging of the pelvis including uterus, ovaries, adnexal regions, and pelvic cul-de-sac. It was necessary to proceed with endovaginal exam following  the transabdominal exam to visualize the endometrium and ovaries. COMPARISON:  February 24, 2017 FINDINGS: Uterus Measurements: 5.2 x 2.2 x 4.4 cm. Small calcified region in the fundus measuring 8 x 5 x 4 mm, likely a calcified  fibroid. Endometrium Thickness: 3 mm.  No focal abnormality visualized. Right ovary Not visualized. Left ovary Not visualized. Other findings No abnormal free fluid. IMPRESSION: 1. Probable small calcified fibroid in the fundus of the uterus. 2. Neither ovary was visualized. Electronically Signed   By: Dorise Bullion III M.D   On: 08/17/2017 14:25   Current Meds:  Outpatient Encounter Medications as of 08/21/2017  Medication Sig  . albuterol (PROVENTIL HFA;VENTOLIN HFA) 108 (90 Base) MCG/ACT inhaler Inhale 2 puffs into the lungs every 4 (four) hours as needed for wheezing or shortness of breath.  Marland Kitchen aspirin EC 81 MG tablet Take 81 mg by mouth daily.  Marland Kitchen atenolol (TENORMIN) 50 MG tablet TAKE 1/2 TABLET BY MOUTH TWICE A DAY  . captopril (CAPOTEN) 50 MG tablet Take 50 mg by mouth 2 (two) times daily.  . Cholecalciferol (VITAMIN D3) 2000 units capsule 1 tablet by mouth daily  . clonazePAM (KLONOPIN) 2 MG tablet Take 1 tablet (2 mg total) by mouth at bedtime. (Patient taking differently: Take 2 mg by mouth at bedtime. Take 1/2 tablet by mouth at bedtime)  . fexofenadine (ALLEGRA) 180 MG tablet Take 180 mg by mouth daily as needed.   . fluticasone (FLONASE) 50 MCG/ACT nasal spray Place 2 sprays into the nose daily as needed for allergies. Spray twice daily as needed  . guaiFENesin (MUCINEX) 600 MG 12 hr tablet Take 1,200 mg by mouth 2 (two) times daily as needed for cough.   Marland Kitchen KEPPRA 750 MG tablet Take 1 tablet (750 mg total) by mouth 2 (two) times daily.  Marland Kitchen LEXAPRO 20 MG tablet Take 1 tablet (20 mg total) by mouth daily. Morning dosage  . meloxicam (MOBIC) 15 MG tablet 1 tablet as needed  . simvastatin (ZOCOR) 10 MG tablet Take 10 mg by mouth at bedtime.  . triamterene-hydrochlorothiazide (MAXZIDE-25) 37.5-25 MG tablet Take 1 tablet by mouth daily.  Marland Kitchen umeclidinium-vilanterol (ANORO ELLIPTA) 62.5-25 MCG/INH AEPB Inhale 1 puff daily  . [DISCONTINUED] doxycycline (VIBRA-TABS) 100 MG tablet Take 1 tablet  (100 mg total) by mouth 2 (two) times daily. (Patient not taking: Reported on 08/21/2017)   No facility-administered encounter medications on file as of 08/21/2017.     Allergy:  Allergies  Allergen Reactions  . Prozac [Fluoxetine Hcl] Other (See Comments)    seizures  . Sulfa Antibiotics Nausea And Vomiting and Other (See Comments)    TINGLING IN LEGS ALSO  . Micardis [Telmisartan] Swelling and Other (See Comments)    Swelling, bruising Swelling, bruising  . Prednisone Other (See Comments)    mood changes Mood changes  . Lisinopril-Hydrochlorothiazide Other (See Comments)    unknown  . Augmentin [Amoxicillin-Pot Clavulanate] Rash  . Avelox [Moxifloxacin Hcl In Nacl] Rash    LEVAQUIN IS OK-SEE NOTE  . Norvasc [Amlodipine Besylate] Other (See Comments)    fatigue    Social Hx:   Social History   Socioeconomic History  . Marital status: Married    Spouse name: Jenny Reichmann  . Number of children: 2  . Years of education: 8  . Highest education level: Not on file  Occupational History  . Not on file  Social Needs  . Financial resource strain: Not on file  . Food insecurity:    Worry: Not  on file    Inability: Not on file  . Transportation needs:    Medical: Not on file    Non-medical: Not on file  Tobacco Use  . Smoking status: Former Smoker    Packs/day: 1.00    Years: 41.00    Pack years: 41.00    Last attempt to quit: 01/13/2006    Years since quitting: 11.6  . Smokeless tobacco: Never Used  Substance and Sexual Activity  . Alcohol use: No    Alcohol/week: 0.0 standard drinks  . Drug use: No  . Sexual activity: Never    Partners: Male    Birth control/protection: Post-menopausal  Lifestyle  . Physical activity:    Days per week: Not on file    Minutes per session: Not on file  . Stress: Not on file  Relationships  . Social connections:    Talks on phone: Not on file    Gets together: Not on file    Attends religious service: Not on file    Active member of  club or organization: Not on file    Attends meetings of clubs or organizations: Not on file    Relationship status: Not on file  . Intimate partner violence:    Fear of current or ex partner: Not on file    Emotionally abused: Not on file    Physically abused: Not on file    Forced sexual activity: Not on file  Other Topics Concern  . Not on file  Social History Narrative   Patient is married ([name]) and lives at home with her husband.   Patient is retired.   Patient has two children.   Patient has a high school education.   Patient is right-handed.   Patient drinks 2 big mugs of coffee daily and sometimes tea in the evenings.    Past Surgical Hx:  Past Surgical History:  Procedure Laterality Date  . broken ankle Right   . CEREBRAL ANEURYSM REPAIR    . TONSILLECTOMY      Past Medical Hx:  Past Medical History:  Diagnosis Date  . BRCA2 positive 2017  . Diverticulosis   . Hearing loss   . HTN (hypertension)   . Memory loss   . 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 Seizures ([place])   . 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   . Vestibulitis of ear     Family Hx:  Family History  Problem Relation Age of Onset  . Dementia Mother   . Stroke Mother   . Thyroid disease Mother   . Osteoporosis Mother   . Ovarian cancer Paternal Grandmother 42  . Ovarian cancer Paternal Aunt 75  . Breast cancer Daughter 16       breast ca x2, bilateral dx. 38, 46--estrogen  . Other Daughter        no genetic testing as of 07/2015; has had ovaries removed  . Other Daughter        BRCA 2 Pos.  . Colon cancer Maternal Aunt        dx. unspecified age  . Emphysema Maternal Uncle   . High blood pressure Maternal Grandmother   . Dementia Maternal Grandfather   . Bleeding Disorder Maternal Grandfather   . Emphysema Maternal Aunt   . Emphysema Maternal Aunt    Review of Systems  Gastrointestinal: Positive for abdominal pain and  constipation.  Genitourinary: Positive for dysuria.   Psychiatric/Behavioral: Positive for decreased  concentration.  All other systems reviewed and are negative. Feels bloating but more due to constipation   Vitals:  Blood pressure (!) 144/69, pulse (!) 58, temperature 98 F (36.7 C), temperature source Oral, resp. rate 20, height _0  (1.626 m), weight 228 lb 1.6 oz (103.5 kg), last menstrual period 01/14/1995, SpO2 97 %.[place] 09/17/16:  Physical Exam:   General :  Obese Well developed, 77 y.o., female in no apparent distress HEENT:  Normocephalic/atraumatic, symmetric, EOMI, eyelids normal Neck:   Supple, no masses.  Lymphatics:  No cervical/ submandibular/ supraclavicular/ infraclavicular/ inguinal adenopathy Respiratory:  Respirations unlabored, no use of accessory muscles CV:   Deferred Breast:  Deferred Musculoskeletal: No CVA tenderness, normal muscle strength. Abdomen:  Obese Soft, non-tender and nondistended. No evidence of hernia. No masses. Extremities:  No lymphedema, no erythema, non-tender. Skin:   Normal inspection Neuro/Psych:  No focal motor deficit, no abnormal mental status. Normal gait. Normal affect. Alert and oriented to person, place, and time  Genito Urinary: Vulva: Normal external female genitalia.  Bladder/urethra: Urethral meatus normal in size and location. No lesions or   masses, well supported bladder Speculum exam: Vagina: No lesion, no discharge, no bleeding. Cervix: Normal appearing, no lesions. Bimanual exam:  Uterus: Unable to delineate  Adnexa: No masses. Rectovaginal:  Good tone, no masses, no cul de sac nodularity, no parametrial involvement or nodularity.  Assessment/Plan:  77 y.o. female  1. BRCA2 mutation who has a complicated medical history with COPD, BMI 51, s/p CVA with residual seizure disorder.  Continue with surveillance since surgery would pose significant risk to patient.  [place] without concerning features and CA 125 within normal range.     Plan Q6 month pelvic, CA125 and TVUS 2. Rectocele with splinting  She is not a surgical candidate but might benefit from a visit with Urogyn to see if any nonsurgical options to help  Alternative if they cannot help her is a pelvic PTx referral.  Face to face time with patient was 15 minutes. Over 50% of this time was spent on counseling and coordination of care.  Isabel Caprice, MD 08/21/2017, 2:15 PM   Cc: Kathyrn Lass, MD (PCP)

## 2017-08-22 LAB — CA 125: Cancer Antigen (CA) 125: 13.9 U/mL (ref 0.0–38.1)

## 2017-08-24 ENCOUNTER — Telehealth: Payer: Self-pay | Admitting: *Deleted

## 2017-08-24 ENCOUNTER — Encounter: Payer: Self-pay | Admitting: Oncology

## 2017-08-24 NOTE — Telephone Encounter (Signed)
Notified of appt with Dr Zigmund Daniel on 8/14 @ 0915.

## 2017-08-25 ENCOUNTER — Telehealth: Payer: Self-pay

## 2017-08-25 NOTE — Telephone Encounter (Signed)
Told Beth [name] that the CA-125 from 08-21-17 was normal at 13.9 per Joylene [name], NP.

## 2017-08-26 DIAGNOSIS — N393 Stress incontinence (female) (male): Secondary | ICD-10-CM | POA: Diagnosis not present

## 2017-08-26 DIAGNOSIS — K5902 Outlet dysfunction constipation: Secondary | ICD-10-CM | POA: Diagnosis not present

## 2017-08-26 DIAGNOSIS — N816 Rectocele: Secondary | ICD-10-CM | POA: Diagnosis not present

## 2017-08-26 DIAGNOSIS — Z6838 Body mass index (BMI) 38.0-38.9, adult: Secondary | ICD-10-CM | POA: Diagnosis not present

## 2017-10-07 ENCOUNTER — Ambulatory Visit (INDEPENDENT_AMBULATORY_CARE_PROVIDER_SITE_OTHER): Payer: Medicare Other | Admitting: Pulmonary Disease

## 2017-10-07 ENCOUNTER — Encounter: Payer: Self-pay | Admitting: Pulmonary Disease

## 2017-10-07 DIAGNOSIS — J449 Chronic obstructive pulmonary disease, unspecified: Secondary | ICD-10-CM | POA: Diagnosis not present

## 2017-10-07 DIAGNOSIS — J9611 Chronic respiratory failure with hypoxia: Secondary | ICD-10-CM | POA: Diagnosis not present

## 2017-10-07 MED ORDER — TIOTROPIUM BROMIDE-OLODATEROL 2.5-2.5 MCG/ACT IN AERS: 2.0000 | INHALATION_SPRAY | Freq: Every day | RESPIRATORY_TRACT | 0 refills | Status: DC

## 2017-10-07 NOTE — Patient Instructions (Signed)
Finish what Anoro you have left and then trial of Stiolto since he did not like the powder inhaler -call us for prescription if this works  Take flu shot with primary care  Call us for signs of chest cold, wheezing or yellow/green sputum

## 2017-10-07 NOTE — Progress Notes (Signed)
   Subjective:    Patient ID: Beth [name], female    DOB: [birth year], 77 y.o.   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  Subsequent seizures. Has some word finding issues and cognitive deficits with emotional lability   She admits to having some cognitive deficits and memory issues. She would like to get her flu shot with her appointment with primary care in 2 weeks. She is compliant with oxygen. Dyspnea is at baseline, she had a poor expense with pulmonary rehab and would not want to go back.  She admits to sedentary lifestyle.  She is compliant with Anoro but does not like to powder inhaler  Medication review shows captopril , has occasional tickle in her throat but did not tolerate ARB due to swelling  Significant tests/ events  CT angio chest 07/2015 >>no pulmonary embolism, no emphysema, calcified mediastinal LNs  2010 head CT >>polyps or mucous retention cysts seen within the maxillary sinuses bilaterally  PFTs 09/2015>>severe airway obstruction with FEV1 40%, improves to 47% with albuterol,DLCO decreased to 45%  Review of Systems neg for any significant sore throat, dysphagia, itching, sneezing, nasal congestion or excess/ purulent secretions, fever, chills, sweats, unintended wt loss, pleuritic or exertional cp, hempoptysis, orthopnea pnd or change in chronic leg swelling. Also denies presyncope, palpitations, heartburn, abdominal pain, nausea, vomiting, diarrhea or change in bowel or urinary habits, dysuria,hematuria, rash, arthralgias, visual complaints, headache, numbness weakness or ataxia.     Objective:   Physical Exam   Gen. Pleasant, well-nourished, in no distress ENT - no thrush, no post nasal drip Neck: No JVD, no thyromegaly, no carotid bruits Lungs: no use of  accessory muscles, no dullness to percussion, decreased without rales or rhonchi  Cardiovascular: Rhythm regular, heart sounds  normal, no murmurs or gallops, no peripheral edema Musculoskeletal: No deformities, no cyanosis or clubbing       Assessment & Plan:

## 2017-10-07 NOTE — Assessment & Plan Note (Signed)
Continue portable oxygen at all times

## 2017-10-07 NOTE — Assessment & Plan Note (Signed)
Finish what Anoro you have left and then trial of Stiolto since he did not like the powder inhaler -call us for prescription if this works  Take flu shot with primary care  Call us for signs of chest cold, wheezing or yellow/green sputum

## 2017-10-14 ENCOUNTER — Encounter: Payer: Self-pay | Admitting: Neurology

## 2017-10-14 ENCOUNTER — Ambulatory Visit (INDEPENDENT_AMBULATORY_CARE_PROVIDER_SITE_OTHER): Payer: Medicare Other | Admitting: Neurology

## 2017-10-14 DIAGNOSIS — G40409 Other generalized epilepsy and epileptic syndromes, not intractable, without status epilepticus: Secondary | ICD-10-CM

## 2017-10-14 DIAGNOSIS — F09 Unspecified mental disorder due to known physiological condition: Secondary | ICD-10-CM

## 2017-10-14 DIAGNOSIS — I609 Nontraumatic subarachnoid hemorrhage, unspecified: Secondary | ICD-10-CM | POA: Diagnosis not present

## 2017-10-14 MED ORDER — CLONAZEPAM 2 MG PO TABS: 2.0000 mg | ORAL_TABLET | Freq: Every day | ORAL | 1 refills | Status: DC

## 2017-10-14 MED ORDER — KEPPRA 750 MG PO TABS: 750.0000 mg | ORAL_TABLET | Freq: Two times a day (BID) | ORAL | 3 refills | Status: DC

## 2017-10-14 NOTE — Progress Notes (Addendum)
PATIENT: Beth [name] DOB: 05-30-1940  REASON FOR VISIT: follow up- seizures HISTORY FROM: patient alone, she left husband in the waiting room. She is on a 02 concentrator.   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 In 2010 she injured her foot severely during a seizure and many surgeries to the ankle.  She became oxygen dependent in 2018 , has COPD. A cognitive decline has been evidient in her last 2 visits with [facility] and MMSE testing. She has difficulties to structure her thoughts, is logorrhoeic and yet tangential, but this has improved with oxygen ! She is less fatigued. More alert. She reports today is very good day, and she also reports having very bad ones. Some days she is too tired , too fatigued to move.  Beth [name] had no interval seizure activity, this makes her seizure-free for 12 months.    She continues to take a baby aspirin, albuterol inhaler, atenolol tablets 25 mg her daily dose, captopril 2 times a day, vitamin D,  Lexapro, Allegra and Klonopin 2 mg at bedtime.  She also has Flonase nasal spray, Mucinex as needed for cough, she takes Keppra 750 mg twice a day, she also is on tiotropium bromide, on Maxide and on ellipta ANORO.   The bedtime medication was needed for nocturnal agitation, and seizure and insomnia. We are discussing her last MRI brain 04-2017, with mild microvascular changes, but a larger area of glioses from [facility].     Montreal Cognitive Assessment  10/14/2017 04/08/2016 10/10/2015 10/11/2014  Visuospatial/ Executive (0/5) _0 Naming (0/3) _1 Attention: Read list of digits (0/2) _2 Attention: Read list of letters (0/1) 0 _3 Attention: Serial 7 subtraction starting at 100 (0/3) _4 Language: Repeat phrase (0/2) _5 Language : Fluency (0/1) 1 0  1 0  Abstraction (0/2) _6 Delayed Recall (0/5) _7 Orientation (0/6) _8 Total _9 Adjusted Score (based on education) 27 26 - 27           04-13-2017, Patient is here for seizure follow up, concentration and memory problems.  Beth [name] endorsed the geriatric depression score at 5 out of 15 points, which is a borderline result.  She states she had no seizure activity over the last 6-8 months, she is now using oxygen 24/7 portable tank is with her today. COPD  Beth [name] reports that some of her routines and rituals have changed-certainly over the last year- again she feels that she is less organized, she has often skipped certain activities that she used to do in order. At  one time she accused her husband of getting lost while he was driving, because she could not recognize the surroundings. Not cooking or backing any longer. 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      History : MM [name] is a 77 year old female with a history of seizures. She returns today for follow-up. She continues to take Keppra 750 mg twice a day. She denies any seizure events. She is able to complete all ADLs independently. She is currently not operating a motor vehicle. In  the past the patient has reported repeatedly trouble with her memory. At the last visit her memory score has remained stable. Patient reports that her memory continued to remained stable. She states that she is forgetful at times reports having a hard time recalling certain things such as a restaurant name. She states that she has been diagnosed with COPD. During this flare up she felt that she had an abnormal heart rhythm and was concerned about a potential stroke. However she reports she did not have any strokelike symptoms. She is currently on medication and inhalers to treat her COPD that she feels is working well for her. She returns today for an  evaluation.   HISTORY 04/10/15: MM- [name] is a 77 year old female with a history of seizures. She returns today for follow-up. She is currently taking Keppra 750 mg twice a day. She reports that she is tolerating this medication well. She denies any new neurological symptoms. She states at home she is able to complete all ADLs independently. She stopped driving after her stroke. 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 She states that at the last visit Dr. Brett Fairy ordered belsomra and gave her samples however she did not start this medication. She states that she has continue using the Klonopin. She reports that she has had some issues breathing and was given albuterol. She states that she is still having some difficulty breathing and her primary care mentioned a sleep study. She denies snoring. She denies any excessive daytime sleepiness. Denies a morning headache. She states that her husband has not witnessed any apnea events. The patient feels that her memory has remained stable. She just returned from traveling to see family members. She does report some numbness in the fingertips on the right hand. Occasionally will drop things in the right hand. However this time she does not want to proceed with any additional testing. She returns today for an evaluation.  HISTORY 10/11/14 [facility]): Patient returned for a yearly follow up. She reports medical problems: a carbuncle with MRSA, diagnosed spring 2016 and treated with ATB( doxicycline) by Dr. Amy Martinique , her Dermatologist . The patient tolerated the oral doxycycline fine. Beth [name]'s cognitive status remains stable we performed a [place]ral cognitive assessment test today and she scored well on the clock drawing . She has a mild tremor which impairs the contour of the clock face, she scored well on this Trail Making Test name 3 animals had some trouble again this tremor on drawing a cube. Was able to do the attention tests  but not subtraction. Language and abstraction were tested the patient only had a word fluency test of 9 points. She was able to score 4 out of 5 recall words and was fully oriented to date place and time. She scored 26 out of 30 points, is a HS graduate. 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 No seizures in the interval time.   Beth [name] is a 77 y.o. female here seen originally many years ago as a referral from Dr.Kevin Little ,  Beth [name] is an established patient of my practice. 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  She also has a history of hypertension, osteopenia, vertigo and some mild hearing loss, classified as vestibulitis in her older records. Surgical history is positive for right foot fracture in May 2010 and  4 surgeries to the ankle. Her ankle was fractured in a seizure related incident 2010,  twisting her foot in the midst of her typical left twisting body movements during a seizure.  Beth [name] brain injury in 2008 has affected her cognitively and she often struggles to find words. 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 She is taking 20 mg daily. She also takes vitamin D against fatigue. Her affect is better controlled and she feels more assured, and now is less socially isolated.  Beth [name] had been seizure-free for over 12 months by the time of her April 2014 visit, now she reports that she had one seizure around Christmas 2014, while compliant on her current regimen of Keppra and Clonazepam.  She fills her medications at [facility] on [address] in [place].Vestibular evaluation for severe vertigo   REVIEW OF SYSTEMS: Out of a complete 14 system review of symptoms, the patient complains only of the following symptoms, and all other reviewed systems are  negative.  MCI - since stroke, small vessel; diease. Seizures.   ALLERGIES: Allergies  Allergen Reactions  . Prozac [Fluoxetine Hcl] Other (See Comments)    seizures  . Sulfa Antibiotics Nausea And Vomiting and Other (See Comments)    TINGLING IN LEGS ALSO  . Micardis [Telmisartan] Swelling and Other (See Comments)    Swelling, bruising Swelling, bruising  . Prednisone Other (See Comments)    mood changes Mood changes  . Lisinopril-Hydrochlorothiazide Other (See Comments)    unknown  . Augmentin [Amoxicillin-Pot Clavulanate] Rash  . Avelox [Moxifloxacin Hcl In Nacl] Rash    LEVAQUIN IS OK-SEE NOTE  . Norvasc [Amlodipine Besylate] Other (See Comments)    fatigue    HOME MEDICATIONS: Outpatient Medications Prior to Visit  Medication Sig Dispense Refill  . albuterol (PROVENTIL HFA;VENTOLIN HFA) 108 (90 Base) MCG/ACT inhaler Inhale 2 puffs into the lungs every 4 (four) hours as needed for wheezing or shortness of breath. 1 Inhaler 5  . aspirin EC 81 MG tablet Take 81 mg by mouth daily.    Marland Kitchen atenolol (TENORMIN) 50 MG tablet TAKE 1/2 TABLET BY MOUTH TWICE A DAY    . captopril (CAPOTEN) 50 MG tablet Take 50 mg by mouth 2 (two) times daily.    . Cholecalciferol (VITAMIN D3) 2000 units capsule 1 tablet by mouth daily    . clonazePAM (KLONOPIN) 2 MG tablet Take 1 tablet (2 mg total) by mouth at bedtime. (Patient taking differently: Take 2 mg by mouth at bedtime. Take 1/2 tablet by mouth at bedtime) 90 tablet 1  . fexofenadine (ALLEGRA) 180 MG tablet Take 180 mg by mouth daily as needed.     . fluticasone (FLONASE) 50 MCG/ACT nasal spray Place 2 sprays into the nose daily as needed for allergies. Spray twice daily as needed    . guaiFENesin (MUCINEX) 600 MG 12 hr tablet Take 1,200 mg by mouth 2 (two) times daily as needed for cough.     Marland Kitchen KEPPRA 750 MG tablet Take 1 tablet (750 mg total) by mouth 2 (two) times daily. 180 tablet 3  . LEXAPRO 20 MG tablet Take 1 tablet (20 mg total) by  mouth daily. Morning dosage 90 tablet 3  . meloxicam (MOBIC) 15 MG tablet 1 tablet as needed    . simvastatin (ZOCOR) 10 MG tablet Take 10 mg by mouth at bedtime.    . triamterene-hydrochlorothiazide (MAXZIDE-25) 37.5-25 MG tablet Take 1 tablet by mouth daily.    Marland Kitchen  umeclidinium-vilanterol (ANORO ELLIPTA) 62.5-25 MCG/INH AEPB Inhale 1 puff daily 1 each 5  . Tiotropium Bromide-Olodaterol (STIOLTO RESPIMAT) 2.5-2.5 MCG/ACT AERS Inhale 2 puffs into the lungs daily. (Patient not taking: Reported on 10/14/2017) 2 Inhaler 0   No facility-administered medications prior to visit.     PAST MEDICAL HISTORY: Past Medical History:  Diagnosis Date  . BRCA2 positive 2017  . Diverticulosis   . Hearing loss   . HTN (hypertension)   . Memory loss   . Seizure disorder, complex partial, 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[place]) seizure free for one year  . Seizures ([place])   . 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   . Vestibulitis of ear     PAST SURGICAL HISTORY: Past Surgical History:  Procedure Laterality Date  . broken ankle Right   . CEREBRAL ANEURYSM REPAIR    . TONSILLECTOMY      FAMILY HISTORY: Family History  Problem Relation Age of Onset  . Dementia Mother   . Stroke Mother   . Thyroid disease Mother   . Osteoporosis Mother   . Ovarian cancer Paternal Grandmother 41  . Ovarian cancer Paternal Aunt 17  . Breast cancer Daughter 78       breast ca x2, bilateral dx. 38, 46--estrogen  . Other Daughter        no genetic testing as of 07/2015; has had ovaries removed  . Other Daughter        BRCA 2 Pos.  . Colon cancer Maternal Aunt        dx. unspecified age  . Emphysema Maternal Uncle   . High blood pressure Maternal Grandmother   . Dementia Maternal Grandfather   . Bleeding Disorder Maternal Grandfather   . Emphysema Maternal Aunt   . Emphysema Maternal Aunt     SOCIAL HISTORY: Social History   Socioeconomic History  . Marital status: Married    Spouse name: Jenny Reichmann  . Number of  children: 2  . Years of education: 53  . Highest education level: Not on file  Occupational History  . Not on file  Social Needs  . Financial resource strain: Not on file  . Food insecurity:    Worry: Not on file    Inability: Not on file  . Transportation needs:    Medical: Not on file    Non-medical: Not on file  Tobacco Use  . Smoking status: Former Smoker    Packs/day: 1.00    Years: 41.00    Pack years: 41.00    Last attempt to quit: 01/13/2006    Years since quitting: 11.7  . Smokeless tobacco: Never Used  Substance and Sexual Activity  . Alcohol use: No    Alcohol/week: 0.0 standard drinks  . Drug use: No  . Sexual activity: Never    Partners: Male    Birth control/protection: Post-menopausal  Lifestyle  . Physical activity:    Days per week: Not on file    Minutes per session: Not on file  . Stress: Not on file  Relationships  . Social connections:    Talks on phone: Not on file    Gets together: Not on file    Attends religious service: Not on file    Active member of club or organization: Not on file    Attends meetings of clubs or organizations: Not on file    Relationship status: Not on file  . Intimate partner violence:    Fear of current or ex partner: Not on file  Emotionally abused: Not on file    Physically abused: Not on file    Forced sexual activity: Not on file  Other Topics Concern  . Not on file  Social History Narrative   Patient is married ([name]) and lives at home with her husband.   Patient is retired.   Patient has two children.   Patient has a high school education.   Patient is right-handed.   Patient drinks 2 big mugs of coffee daily and sometimes tea in the evenings.      PHYSICAL EXAM  Vitals:   10/14/17 1418  BP: 124/66  Pulse: (!) 55  Weight: 233 lb (105.7 kg)  Height: _0  (1.651 m)   Body mass index is 38.77 kg/m.  [facility] [address] [place], [place] [address] [phone]  NEUROIMAGING REPORT    STUDY DATE:04/21/2017 PATIENT NAME: Beth [name] DOB: 1940-04-08 MRN: [id number]  ORDERING CLINICIAN: Dr Puneet Masoner CLINICAL HISTORY:  89 year patient with seizures COMPARISON FILMS: Ct Head 06/10/2008 EXAM: MRI Brain wo TECHNIQUE: MRI of the brain without contrast was obtained utilizing 5 mm axial slices with T1, T2, T2 flair, T2 star gradient echo and diffusion weighted views.  T1 sagittal and T2 coronal views were obtained. CONTRAST: none IMAGING SITE: [facility]  FINDINGS:  The brain parenchyma shows large area of encephalomalacia with gliosis and remote age hemorrhagic blood products in the right middle and posterior temporal lobes likely sequelae of old hemorrhagic infarct. There is mild compensatory dilatation of the right lateral ventricle. There are mild age-appropriate changes of chronic microvascular ischemia. No other structural lesion or tumor is noted. Calvarium shows no abnormalities. Orbits appear unremarkable. Paranasal sinuses show only minor mucosal thickening with mucus retention cyst/polyp in the left maxillary antrum.Pituitary gland and cerebellar tonsils appear normal. Flow voids of the large vessels of the intracranial circulation appear patent.      IMPRESSION:  Abnormal MRI scan of the brain showing large area of remote age encephalomalacia with gliosis and hemorrhagic blood products in the right temporal lobe likely sequelae of her prior hemorrhagic infarct. There are mild age-appropriate changes of chronic microvascular ischemia. There are incidental changes of mild chronic paranasal sinusitis with mucous retention cyst/polyp in the left maxillary antrum.There are no acute abnormalities. INTERPRETING PHYSICIAN:  Antony Contras, MD Certified in  Neuroimaging by [employer] and [employer]    Result History   MR BRAIN WO CONTRAST (Order [id number]) on  04/23/2017 - Order Result History Report  Encounter-Level Documents - 04/21/2017:   Electronic signature on 04/21/2017 4:26 PM: 315 - Signed  Scan on 04/21/2017 4:58 PM by Default, Provider, MDScan on 04/21/2017 4:58 PM by Default, Provider, MD      Order-Level Documents:   There are no order-level documents.  Orders Requiring a Screening Form   Procedure Order Status Form Status  MR BRAIN WO CONTRAST Completed Completed  Vitals   Height Weight BMI (Calculated)  _1  (1.626 m) 227 lb (103 kg) 38.95  Protocol Documents   Imaging Protocol  Imaging   Imaging Information  Resulted by:   Signed Date/Time  Phone Pager  [place], [place] 04/23/2017 6:09 PM [phone]   Study Notes            Montreal Cognitive Assessment  04/08/2016 10/10/2015 10/11/2014  Visuospatial/ Executive (0/5) _2 Naming (0/3) _3 Attention: Read list of digits (0/2) _4 Attention:  Read list of letters (0/1) _0 Attention: Serial 7 subtraction starting at 100 (0/3) _1 Language: Repeat phrase (0/2) _2 Language : Fluency (0/1) 0 1 0  Abstraction (0/2) _3 Delayed Recall (0/5) _4 Orientation (0/6) _5 Total _6 Adjusted Score (based on education) 26 - 27     Generalized: Well developed, groomed, in no acute distress, on 02 for  24/7 now.    Neurological examination  Mentation: alert - today was able to state  time, place, history . She is a little anxious. She fears her husband develops dementia. She is tangential, and has been all over the place with her interval report, fluent speech, slightly dysphonic.  Cranial nerve : loss of taste and smell- she craves sweets. Pupils were equal in size, both are round- and remained reactive to light.  Extraocular movements were full, visual field were full on confrontational test.  Facial sensation and strength were normal. Uvula and tongue move in midline, no swelling, no tremor. Neck stiffness- restricted ROM-  Head turning  and shoulder shrug  were impaired.   soft touch is felt on all 4 extremities. Coordination:  finger-nose-maneuver is without  Gait and station: Gait deferred  Reflexes: Deep tendon reflexes are symmetric bilaterally.   DIAGNOSTIC DATA (LABS, IMAGING, TESTING) - I reviewed patient records, labs, notes, testing and imaging myself where available.  Lab Results  Component Value Date   WBC 8.4 08/17/2017   HGB 12.9 08/17/2017   HCT 38.1 08/17/2017   MCV 96.8 08/17/2017   PLT 233 08/17/2017      Component Value Date/Time   NA 138 08/17/2017 1420   K 4.1 08/17/2017 1420   CL 100 08/17/2017 1420   CO2 27 08/17/2017 1420   GLUCOSE 97 08/17/2017 1420   BUN 13 08/17/2017 1420   CREATININE 0.87 08/17/2017 1420   CALCIUM 9.3 08/17/2017 1420   PROT 7.5 08/17/2017 1420   ALBUMIN 3.8 08/17/2017 1420   AST 20 08/17/2017 1420   ALT 19 08/17/2017 1420   ALKPHOS 48 08/17/2017 1420   BILITOT 0.4 08/17/2017 1420   GFRNONAA >60 08/17/2017 1420   GFRAA >60 08/17/2017 1420       ASSESSMENT AND PLAN 77 y.o. year old female  has a past medical history of BRCA2 positive (2017), Diverticulosis, Hearing loss, HTN (hypertension), Memory loss, Seizure disorder, complex partial, 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[place]) (seizure free for one year), Seizures ([place]), Stroke, hemorrhagic ([place]) (aneurysm bleed. ), and Vestibulitis of ear. here with:   1. Status post [place] after aneurysm bleed in 2008- developed Seizures, worst manifestations in 2010- now controlled on Keppra  2. 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 3. Encephalopathy has improved with oxygen supplementation- more alert, more oriented.  4. Gait instability, beginning with ankle fracture in 2010 and now wide based. Walks still without a cane. Insecure when climbing stairs.  She'll continue on Keppra 750 mg twice a day. Patient's memory score has remained stable. We  will continue to monitor her by [facility] tests q 6 month. If her husband is truly as cognitively impaired the couple may need a care taker to come in, likely the couple's daughter in [place]- for help with medication, financial affairs, taxes , etc.  No driving !    Follow-up in 6 months with NP, please ,  the next visit after that is again with me. MOCA , refills if needed.    Larey Seat, MD  10/14/2017, 2:34 PM [facility] [address] [place], [place] [address] [phone]

## 2017-10-14 NOTE — Patient Instructions (Signed)
Dementia Caregiver Guide Dementia is a term used to describe a number of symptoms that affect memory and thinking. The most common symptoms include:  Memory loss.  Trouble with language and communication.  Trouble concentrating.  Poor judgment.  Problems with reasoning.  Child-like behavior and language.  Extreme anxiety.  Angry outbursts.  Wandering from home or public places.  Dementia usually gets worse slowly over time. In the early stages, people with dementia can stay independent and safe with some help. In later stages, they need help with daily tasks such as dressing, grooming, and using the bathroom. How to help the person with dementia cope Dementia can be frightening and confusing. Here are some tips to help the person with dementia cope with changes caused by the disease. General tips  Keep the person on track with his or her routine.  Try to identify areas where the person may need help.  Be supportive, patient, calm, and encouraging.  Gently remind the person that adjusting to changes takes time.  Help with the tasks that the person has asked for help with.  Keep the person involved in daily tasks and decisions as much as possible.  Encourage conversation, but try not to get frustrated or harried if the person struggles to find words or does not seem to appreciate your help. Communication tips  When the person is talking or seems frustrated, make eye contact and hold the person's hand.  Ask specific questions that need yes or no answers.  Use simple words, short sentences, and a calm voice. Only give one direction at a time.  When offering choices, limit them to just 1 or 2.  Avoid correcting the person in a negative way.  If the person is struggling to find the right words, gently try to help him or her. How to recognize symptoms of stress Symptoms of stress in caregivers include:  Feeling frustrated or angry with the person with  dementia.  Denying that the person has dementia or that his or her symptoms will not improve.  Feeling hopeless and unappreciated.  Difficulty sleeping.  Difficulty concentrating.  Feeling anxious, irritable, or depressed.  Developing stress-related health problems.  Feeling like you have too little time for your own life.  Follow these instructions at home:  Make sure that you and the person you are caring for: ? Get regular sleep. ? Exercise regularly. ? Eat regular, nutritious meals. ? Drink enough fluid to keep your urine clear or pale yellow. ? Take over-the-counter and prescription medicines only as told by your health care providers. ? Attend all scheduled health care appointments.  Join a support group with others who are caregivers.  Ask about respite care resources so that you can have a regular break from the stress of caregiving.  Look for signs of stress in yourself and in the person you are caring for. If you notice signs of stress, take steps to manage it.  Consider any safety risks and take steps to avoid them.  Organize medications in a pill box for each day of the week.  Create a plan to handle any legal or financial matters. Get legal or financial advice if needed.  Keep a calendar in a central location to remind the person of appointments or other activities. Tips for reducing the risk of injury  Keep floors clear of clutter. Remove rugs, magazine racks, and floor lamps.  Keep hallways well lit, especially at night.  Put a handrail and nonslip mat in the bathtub   or shower.  Put childproof locks on cabinets that contain dangerous items, such as medicines, alcohol, guns, toxic cleaning items, sharp tools or utensils, matches, and lighters.  Put the locks in places where the person cannot see or reach them easily. This will help ensure that the person does not wander out of the house and get lost.  Be prepared for emergencies. Keep a list of  emergency phone numbers and addresses in a convenient area.  Remove car keys and lock garage doors so that the person does not try to get in the car and drive.  Have the person wear a bracelet that tracks locations and identifies the person as having memory problems. This should be worn at all times for safety. Where to find support: Many individuals and organizations offer support. These include:  Support groups for people with dementia and for caregivers.  Counselors or therapists.  Home health care services.  Adult day care centers.  Where to find more information: Alzheimer's Association: www.alz.org Contact a health care provider if:  The person's health is rapidly getting worse.  You are no longer able to care for the person.  Caring for the person is affecting your physical and emotional health.  The person threatens himself or herself, you, or anyone else. Summary  Dementia is a term used to describe a number of symptoms that affect memory and thinking.  Dementia usually gets worse slowly over time.  Take steps to reduce the person's risk of injury, and to plan for future care.  Caregivers need support, relief from caregiving, and time for their own lives. This information is not intended to replace advice given to you by your health care provider. Make sure you discuss any questions you have with your health care provider. Document Released: 12/04/2015 Document Revised: 12/04/2015 Document Reviewed: 12/04/2015 Elsevier Interactive Patient Education  2018 Elsevier Inc.  

## 2017-11-02 DIAGNOSIS — E7841 Elevated Lipoprotein(a): Secondary | ICD-10-CM | POA: Diagnosis not present

## 2017-11-02 DIAGNOSIS — F3341 Major depressive disorder, recurrent, in partial remission: Secondary | ICD-10-CM | POA: Diagnosis not present

## 2017-11-02 DIAGNOSIS — Z6839 Body mass index (BMI) 39.0-39.9, adult: Secondary | ICD-10-CM | POA: Diagnosis not present

## 2017-11-02 DIAGNOSIS — G3184 Mild cognitive impairment, so stated: Secondary | ICD-10-CM | POA: Diagnosis not present

## 2017-11-02 DIAGNOSIS — R7303 Prediabetes: Secondary | ICD-10-CM | POA: Diagnosis not present

## 2017-11-02 DIAGNOSIS — I10 Essential (primary) hypertension: Secondary | ICD-10-CM | POA: Diagnosis not present

## 2017-11-02 DIAGNOSIS — Z23 Encounter for immunization: Secondary | ICD-10-CM | POA: Diagnosis not present

## 2017-11-06 DIAGNOSIS — D485 Neoplasm of uncertain behavior of skin: Secondary | ICD-10-CM | POA: Diagnosis not present

## 2017-11-06 DIAGNOSIS — L820 Inflamed seborrheic keratosis: Secondary | ICD-10-CM | POA: Diagnosis not present

## 2017-11-06 DIAGNOSIS — L821 Other seborrheic keratosis: Secondary | ICD-10-CM | POA: Diagnosis not present

## 2017-11-23 ENCOUNTER — Ambulatory Visit
Admission: RE | Admit: 2017-11-23 | Discharge: 2017-11-23 | Disposition: A | Discharge status: Home / Self Care | Payer: Medicare Other | Source: Ambulatory Visit | Attending: Adult Health | Admitting: Adult Health

## 2017-11-23 DIAGNOSIS — Z1231 Encounter for screening mammogram for malignant neoplasm of breast: Secondary | ICD-10-CM

## 2017-11-23 DIAGNOSIS — Z853 Personal history of malignant neoplasm of breast: Secondary | ICD-10-CM | POA: Diagnosis not present

## 2017-11-23 DIAGNOSIS — Z1509 Genetic susceptibility to other malignant neoplasm: Secondary | ICD-10-CM

## 2017-11-23 DIAGNOSIS — Z1501 Genetic susceptibility to malignant neoplasm of breast: Secondary | ICD-10-CM

## 2017-11-23 MED ORDER — GADOBUTROL 1 MMOL/ML IV SOLN
10.0000 mL | Freq: Once | INTRAVENOUS | Status: AC | PRN
  Administered 2017-11-23: 10 mL via INTRAVENOUS

## 2017-11-24 ENCOUNTER — Telehealth: Payer: Self-pay

## 2017-11-24 NOTE — Telephone Encounter (Signed)
LVM for patient to call back. ?

## 2017-11-24 NOTE — Telephone Encounter (Signed)
-----   Message from Gardenia Phlegm, NP sent at 11/24/2017 10:28 AM EST ----- Please call patient with normal results ----- Message ----- From: Interface, Rad Results In Sent: 11/24/2017   9:35 AM EST To: Gardenia Phlegm, NP

## 2017-11-26 ENCOUNTER — Telehealth: Payer: Self-pay

## 2017-11-26 NOTE — Telephone Encounter (Signed)
Incoming call from pt, she needed appt for Feb after her u/s to discuss results.  Appt made for 2/12 at 3:15 pm. No other needs per pt at this time.

## 2017-11-26 NOTE — Telephone Encounter (Signed)
Patient returned call from nurse.  Patient was informed of normal MRI.  Patient voiced understanding without further questions/concerns.

## 2017-11-30 DIAGNOSIS — H04123 Dry eye syndrome of bilateral lacrimal glands: Secondary | ICD-10-CM | POA: Diagnosis not present

## 2017-12-07 ENCOUNTER — Telehealth: Payer: Self-pay | Admitting: Pulmonary Disease

## 2017-12-07 ENCOUNTER — Telehealth: Payer: Self-pay | Admitting: Neurology

## 2017-12-07 NOTE — Telephone Encounter (Signed)
She has a seizure disorder and brain organic syndrome. Let me ask her pulmonologist if this medication is the only choice.

## 2017-12-07 NOTE — Telephone Encounter (Addendum)
Pt is asking for a call from RN once Dr Brett Fairy has been consulted.  Pt states her pulmonologist has put her on a new inhaler yet is states if she has seizures don't take(pt has seizure disorder).  Pt is asking for Dr Dohmeier's thoughts on her using tiotropiumdromide & olodaterol inhalent.

## 2017-12-07 NOTE — Telephone Encounter (Signed)
Spoke with pt, states that she has not yet started her stiolto- has been exhausting her supply of anoro before starting the medication.   Pt was reading the insert with her stiolto and notes that the insert notes contraindications of hypertension, seizure disorder, diabetes (pt does not have diabetes but is concerned that this med may increase her blood sugar).   Pt is concerned with being treated for hypertension and a seizure disorder that stiolto may worsen these conditions, would like RA's advise on this- particularly wants to know the likelihood that this med could cause her to have a seizure.  I also advised pt to follow up with her neurologist about this.   Pt has enough anoro to last 1 month, and is aware that RA is out of the office this week.  RA please advise.  Thanks.

## 2017-12-08 NOTE — Progress Notes (Signed)
[facility]  Telephone:([phone] Fax:([phone]) [phone]     ID: Beth [name] DOB: April 10, 1940  MR#: [id number]  LKT#:[id number]  Patient Care Team: Kathyrn Lass, MD as PCP - General (Family Medicine) Mateo Overbeck, Virgie Dad, MD as Consulting Physician (Oncology) Josue Hector, MD as Consulting Physician (Cardiology) Rigoberto Noel, MD as Consulting Physician (Pulmonary Disease) Dohmeier, Asencion Partridge, MD as Consulting Physician (Neurology) Martinique, Amy, MD as Consulting Physician (Dermatology) Elsie Saas, MD as Consulting Physician (Orthopedic Surgery) Wonda Horner, MD as Consulting Physician (Gastroenterology) Isabel Caprice, MD as Consulting Physician (Gynecologic Oncology) OTHER MD:  CHIEF COMPLAINT: 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 Beth [name] subsequent had a second cancer which she also has survived. On the father's side the patient has little information except that her paternal grandmother had ovarian cancer and one of the paternal aunts also had ovarian cancer, both of them dying in their 63s.  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  She was referred for consideration of risk reducon and intensified screening strategies.  INTERVAL HISTORY: Beth [name] returns today for follow-up of her BRCA 2 positivity.   The patient continues under observation, and is doing well overall.   Since her last visit here, she underwent a transabdominal and transvaginal ultrasound on 08/17/2017, showing probable small calcified fibroid in the fundus of the uterus. Neither ovary was  visualized.   Finally, she underwent a bilateral breast MRI on 11/23/2017, showing no MR evidence of breast malignancy.   REVIEW OF SYSTEMS: Beth [name]  has been diagnosed with Chronic Obstructive Pulmonary Disease (COPD), and is now on oxygen 24/7. She sees Dr. Reuben Likes for her COPD. For exercise, she has not been doing very much. She finds herself asking her husband to complete small tasks because she does not want to worry about dragging around her oxygen with the tubes. She tried rehab, had significant difficulties there.  Recall that Beth [name] also has a history of seizures, following her stroke, and is very concerned about some of the medications she is on who which list seizures as a possible side effect.  The patient denies unusual headaches, visual changes, nausea, vomiting, or dizziness. There has been no unusual cough, phlegm production, or pleurisy. This been no change in bowel habits. The patient denies unexplained fatigue or unexplained weight loss, bleeding, rash, or fever. A detailed review of systems was otherwise noncontributory.    PAST MEDICAL HISTORY: Past Medical History:  Diagnosis Date  . BRCA2 positive 2017  . Diverticulosis   . Hearing loss   . HTN (hypertension)   . Memory loss   . 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 Seizures ([place])   . 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   . Vestibulitis of ear     PAST SURGICAL HISTORY: Past Surgical History:  Procedure Laterality Date  . broken ankle Right   . CEREBRAL ANEURYSM REPAIR    . TONSILLECTOMY      FAMILY HISTORY Family History  Problem Relation Age of Onset  . Dementia Mother   . Stroke Mother   .  Thyroid disease Mother   . Osteoporosis Mother   . Ovarian cancer Paternal Grandmother 52  . Ovarian cancer Paternal Aunt 38  . Breast cancer Daughter 44       breast ca x2, bilateral dx. 38, 46--estrogen  . Other Daughter        no genetic testing  as of 07/2015; has had ovaries removed  . Other Daughter        BRCA 2 Pos.  . Colon cancer Maternal Aunt        dx. unspecified age  . Emphysema Maternal Uncle   . High blood pressure Maternal Grandmother   . Dementia Maternal Grandfather   . Bleeding Disorder Maternal Grandfather   . Emphysema Maternal Aunt   . Emphysema Maternal Aunt     GYNECOLOGIC HISTORY:  Patient's last menstrual period was 01/14/1995 (approximate). Menarche age 78, first live birth age 68. The patient is GX P2. 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 Her husband Beth [name] is a retired Charity fundraiser. He also worked for the [employer]. At home is just the 2 of them plus their dog. The patient's daughter, Beth [name], is BRCA positive, and has undergone a bilateral Pingel oophorectomy and lumpectomy. The patient's younger daughter Beth [name] is BRCA2 positive and she has undergone a total abdominal hysterectomy with bilateral salpingo-oophorectomy and also bilateral mastectomies.    ADVANCED DIRECTIVES: The patient's husband is her healthcare power of attorney     HEALTH MAINTENANCE: Social History   Tobacco Use  . Smoking status: Former Smoker    Packs/day: 1.00    Years: 41.00    Pack years: 41.00    Last attempt to quit: 01/13/2006    Years since quitting: 11.9  . Smokeless tobacco: Never Used  Substance Use Topics  . Alcohol use: No    Alcohol/week: 0.0 standard drinks  . Drug use: No     Colonoscopy: [facility]  PAP:  Bone density: "normal"   Allergies  Allergen Reactions  . Prozac [Fluoxetine Hcl] Other (See Comments)    seizures  . Sulfa Antibiotics Nausea And Vomiting and Other (See Comments)    TINGLING IN LEGS ALSO  . Micardis [Telmisartan] Swelling and Other (See Comments)    Swelling, bruising Swelling, bruising  . Prednisone Other (See Comments)    mood changes Mood  changes  . Lisinopril-Hydrochlorothiazide Other (See Comments)    unknown  . Augmentin [Amoxicillin-Pot Clavulanate] Rash  . Avelox [Moxifloxacin Hcl In Nacl] Rash    LEVAQUIN IS OK-SEE NOTE  . Norvasc [Amlodipine Besylate] Other (See Comments)    fatigue    Current Outpatient Medications  Medication Sig Dispense Refill  . albuterol (PROVENTIL HFA;VENTOLIN HFA) 108 (90 Base) MCG/ACT inhaler Inhale 2 puffs into the lungs every 4 (four) hours as needed for wheezing or shortness of breath. 1 Inhaler 5  . aspirin EC 81 MG tablet Take 81 mg by mouth daily.    Marland Kitchen atenolol (TENORMIN) 50 MG tablet TAKE 1/2 TABLET BY MOUTH TWICE A DAY    . captopril (CAPOTEN) 50 MG tablet Take 50 mg by mouth 2 (two) times daily.    . Cholecalciferol (VITAMIN D3) 2000 units capsule 1 tablet by mouth daily    . clonazePAM (KLONOPIN) 2 MG tablet Take 1 tablet (2 mg total) by mouth at bedtime. 90 tablet 1  . fexofenadine (ALLEGRA) 180 MG tablet Take 180  mg by mouth daily as needed.     . fluticasone (FLONASE) 50 MCG/ACT nasal spray Place 2 sprays into the nose daily as needed for allergies. Spray twice daily as needed    . guaiFENesin (MUCINEX) 600 MG 12 hr tablet Take 1,200 mg by mouth 2 (two) times daily as needed for cough.     Marland Kitchen KEPPRA 750 MG tablet Take 1 tablet (750 mg total) by mouth 2 (two) times daily. 180 tablet 3  . LEXAPRO 20 MG tablet Take 1 tablet (20 mg total) by mouth daily. Morning dosage 90 tablet 3  . meloxicam (MOBIC) 15 MG tablet 1 tablet as needed    . simvastatin (ZOCOR) 10 MG tablet Take 10 mg by mouth at bedtime.    . triamterene-hydrochlorothiazide (MAXZIDE-25) 37.5-25 MG tablet Take 1 tablet by mouth daily.    Marland Kitchen umeclidinium-vilanterol (ANORO ELLIPTA) 62.5-25 MCG/INH AEPB Inhale 1 puff daily 1 each 5   No current facility-administered medications for this visit.     OBJECTIVE: Older white woman wearing oxygen through nasal cannula Vitals:   12/09/17 1358  BP: 138/76  Pulse: (!) 58    Resp: 18  Temp: 98.2 F (36.8 C)  SpO2: 92%     Body mass index is 38.91 kg/m.    ECOG FS:2 - Symptomatic, <50% confined to bed  Sclerae unicteric, EOMs intact Oropharynx clear and moist No cervical or supraclavicular adenopathy Lungs no rales or rhonchi, no wheezes Heart regular rate and rhythm Abd soft, obese, nontender, positive bowel sounds MSK no focal spinal tenderness Neuro: nonfocal, well oriented, appropriate affect Breasts: I do not palpate a mass in either breast.  There are no skin or nipple changes of concern.  Both axillae are benign.  LAB RESULTS:  CMP     Component Value Date/Time   NA 138 08/17/2017 1420   K 4.1 08/17/2017 1420   CL 100 08/17/2017 1420   CO2 27 08/17/2017 1420   GLUCOSE 97 08/17/2017 1420   BUN 13 08/17/2017 1420   CREATININE 0.87 08/17/2017 1420   CALCIUM 9.3 08/17/2017 1420   PROT 7.5 08/17/2017 1420   ALBUMIN 3.8 08/17/2017 1420   AST 20 08/17/2017 1420   ALT 19 08/17/2017 1420   ALKPHOS 48 08/17/2017 1420   BILITOT 0.4 08/17/2017 1420   GFRNONAA >60 08/17/2017 1420   GFRAA >60 08/17/2017 1420    No results found for: TOTALPROTELP, ALBUMINELP, A1GS, A2GS, BETS, BETA2SER, GAMS, MSPIKE, SPEI  No results found for: Beth [name], [facility]  Lab Results  Component Value Date   WBC 8.4 08/17/2017   NEUTROABS 6.0 08/17/2017   HGB 12.9 08/17/2017   HCT 38.1 08/17/2017   MCV 96.8 08/17/2017   PLT 233 08/17/2017      Chemistry      Component Value Date/Time   NA 138 08/17/2017 1420   K 4.1 08/17/2017 1420   CL 100 08/17/2017 1420   CO2 27 08/17/2017 1420   BUN 13 08/17/2017 1420   CREATININE 0.87 08/17/2017 1420      Component Value Date/Time   CALCIUM 9.3 08/17/2017 1420   ALKPHOS 48 08/17/2017 1420   AST 20 08/17/2017 1420   ALT 19 08/17/2017 1420   BILITOT 0.4 08/17/2017 1420       No results found for: LABCA2  No components found for: [id number]  No results for input(s): INR in the last 168  hours.  Urinalysis    Component Value Date/Time   COLORURINE YELLOW 08/18/2014 1742  APPEARANCEUR CLEAR 08/18/2014 1742   LABSPEC 1.006 08/18/2014 1742   PHURINE 6.5 08/18/2014 1742   GLUCOSEU NEGATIVE 08/18/2014 1742   HGBUR TRACE (A) 08/18/2014 1742   BILIRUBINUR NEGATIVE 08/18/2014 1742   KETONESUR NEGATIVE 08/18/2014 1742   PROTEINUR NEGATIVE 08/18/2014 1742   UROBILINOGEN 0.2 08/18/2014 1742   NITRITE NEGATIVE 08/18/2014 1742   LEUKOCYTESUR TRACE (A) 08/18/2014 1742     STUDIES: Mr Breast Bilateral W Wo Contrast Inc Cad  Result Date: 11/24/2017 CLINICAL DATA:  77 year old female for screening breast MRI due to high lifetime risk for developing breast cancer and strong family history. LABS:  Creatinine was obtained on site at [place] at [address] Results: BUN-11, creatinine-0.7, BPZ-02. EXAM: BILATERAL BREAST MRI WITH AND WITHOUT CONTRAST TECHNIQUE: Multiplanar, multisequence MR images of both breasts were obtained prior to and following the intravenous administration of 10 ml of Gadavist Three-dimensional MR images were rendered by post-processing of the original MR data on an independent workstation. The three-dimensional MR images were interpreted, and findings are reported in the following complete MRI report for this study. Three dimensional images were evaluated at the independent DynaCad workstation COMPARISON:  11/26/2016 MR and 06/03/2017 and prior mammograms FINDINGS: Breast composition: a. Almost entirely fat. Background parenchymal enhancement: Minimal Right breast: No mass or abnormal enhancement. Left breast: No mass or abnormal enhancement. Lymph nodes: No abnormal appearing lymph nodes. Ancillary findings:  None. IMPRESSION: No MR evidence of breast malignancy. RECOMMENDATION: Bilateral screening mammogram in 6 months to resume annual mammogram schedule Bilateral screening breast MRI in 1 year as clinically indicated. BI-RADS CATEGORY  1: Negative.  Electronically Signed   By: Margarette Canada M.D.   On: 11/24/2017 09:33    ELIGIBLE FOR AVAILABLE RESEARCH PROTOCOL: No  ASSESSMENT: 77 y.o. BRCA2 positive [place] woman at increased risk for breast and ovarian cancer  (a) the specific BRCA2 mutations is [id number]).   (1) consider anastrozole for breast cancer risk reduction  (a) DEXA scan 01/16/2014 normal, with a T score of 0.7  (2) yearly MRI in addition to yearly mammography, 6 months apart, for intensified breast cancer screening  (3) unable to undergo bilateral salpingo-oophorectomy--followed by gynecological oncology with biannual pelvic ultrasound and Ca-125 determinations  PLAN: Beth [name] has declined considerably secondary to her pulmonary problems.  She is also very concerned regarding her history of strokes and seizures.  Currently she is very limited in what she can do terms of activities.  Unfortunately she did not profit from rehab.  She would like to discontinue the screening for ovarian cancer.  I suggested she discuss this with Dr. Gerarda Fraction.  In any case we do not have data showing that any kind of ovarian cancer screening actually finds the cancer sufficiently early to affect survival.  A second consideration is Beth [name] a significant decline.  I am afraid her prognosis from her COPD is not good.  We are going to continue mammography in May and breast MRI in November and those orders have been entered  She will see me again in a year.  She knows to call for any problems that may develop before that visit.  Beth [name], Virgie Dad, MD  12/09/17 2:25 PM Medical Oncology and Hematology [facility] [address] [place],  [address] Tel. [phone]    Fax. [phone]   I, Jacqualyn Posey am acting as a Education administrator for Chauncey Cruel, MD.   I, Lurline Del MD, have reviewed the above documentation for accuracy and completeness, and  I agree with the above.

## 2017-12-09 ENCOUNTER — Inpatient Hospital Stay: Payer: Medicare Other | Attending: Oncology | Admitting: Oncology

## 2017-12-09 DIAGNOSIS — Z8041 Family history of malignant neoplasm of ovary: Secondary | ICD-10-CM | POA: Insufficient documentation

## 2017-12-09 DIAGNOSIS — J449 Chronic obstructive pulmonary disease, unspecified: Secondary | ICD-10-CM | POA: Insufficient documentation

## 2017-12-09 DIAGNOSIS — Z79899 Other long term (current) drug therapy: Secondary | ICD-10-CM | POA: Insufficient documentation

## 2017-12-09 DIAGNOSIS — Z803 Family history of malignant neoplasm of breast: Secondary | ICD-10-CM | POA: Diagnosis not present

## 2017-12-09 DIAGNOSIS — Z9189 Other specified personal risk factors, not elsewhere classified: Secondary | ICD-10-CM | POA: Insufficient documentation

## 2017-12-09 DIAGNOSIS — Z87891 Personal history of nicotine dependence: Secondary | ICD-10-CM | POA: Diagnosis not present

## 2017-12-09 DIAGNOSIS — Z1501 Genetic susceptibility to malignant neoplasm of breast: Secondary | ICD-10-CM | POA: Diagnosis not present

## 2017-12-09 DIAGNOSIS — G40409 Other generalized epilepsy and epileptic syndromes, not intractable, without status epilepticus: Secondary | ICD-10-CM | POA: Diagnosis not present

## 2017-12-09 DIAGNOSIS — Z1509 Genetic susceptibility to other malignant neoplasm: Secondary | ICD-10-CM

## 2017-12-09 DIAGNOSIS — I10 Essential (primary) hypertension: Secondary | ICD-10-CM

## 2017-12-14 ENCOUNTER — Telehealth: Payer: Self-pay | Admitting: Oncology

## 2017-12-14 DIAGNOSIS — Z1509 Genetic susceptibility to other malignant neoplasm: Principal | ICD-10-CM

## 2017-12-14 DIAGNOSIS — Z1501 Genetic susceptibility to malignant neoplasm of breast: Secondary | ICD-10-CM

## 2017-12-14 NOTE — Telephone Encounter (Signed)
This is an anticholinergic/bronchodilator combination. She has been on a similar medication -for a long time and tolerated this well

## 2017-12-14 NOTE — Telephone Encounter (Signed)
Spoke with the patient she is aware and nothing further is needed at this time.

## 2017-12-14 NOTE — Telephone Encounter (Signed)
Patient returned call, scheduled her for OV with RA for 12/21/17.  Patient does not require call back.

## 2017-12-14 NOTE — Telephone Encounter (Signed)
She was asked to switch from anoro to [employer] only because she did not like the powder inhaler. Either medication will not affect her diabetes or seizure -please let her know

## 2017-12-14 NOTE — Telephone Encounter (Signed)
Noted.  Will close encounter.  

## 2017-12-14 NOTE — Telephone Encounter (Signed)
Called Beth [name] and discussed her screenings for ovarian cancer.  She would like to keep her appointments for [place] and follow up with Dr. Gerarda Fraction in February.  She would like to discuss her options going forward with Dr. Claudell Kyle at her follow up.

## 2017-12-14 NOTE — Telephone Encounter (Signed)
Routing to NP due to [facility] being unavailable.

## 2017-12-14 NOTE — Telephone Encounter (Signed)
She needs to come in and see Alva. Im not sure why he switched her, or if it is insurance related.Make her an appointment with him at his first available. If it is more than a month, have her come in and pick up some Anoro samples. Thanks

## 2017-12-14 NOTE — Telephone Encounter (Signed)
Called patient, unable to reach. Left message to give us a call back.  

## 2017-12-15 NOTE — Telephone Encounter (Signed)
I called patient and relayed information below. She verbalized understanding and appreciation.

## 2017-12-15 NOTE — Telephone Encounter (Signed)
Please tell Chamara to use this inhaler- she had used a similar medication before. See Dr. Bari Mantis note.   Thank you,   C Aleighya Mcanelly.

## 2017-12-21 ENCOUNTER — Ambulatory Visit (INDEPENDENT_AMBULATORY_CARE_PROVIDER_SITE_OTHER): Payer: Medicare Other | Admitting: Pulmonary Disease

## 2017-12-21 ENCOUNTER — Encounter: Payer: Self-pay | Admitting: Pulmonary Disease

## 2017-12-21 DIAGNOSIS — J449 Chronic obstructive pulmonary disease, unspecified: Secondary | ICD-10-CM | POA: Diagnosis not present

## 2017-12-21 DIAGNOSIS — J9611 Chronic respiratory failure with hypoxia: Secondary | ICD-10-CM

## 2017-12-21 NOTE — Progress Notes (Signed)
   Subjective:    Patient ID: Beth [name], female    DOB: 03/12/1940, 77 y.o.   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 Subsequent seizures. Has some word finding issues and cognitive deficits with emotional lability    Chief Complaint  Patient presents with  . Follow-up    pt states she experiences SOB with exertion    On our last visit, she complained about not liking the powder inhaler, Anoro.  Hence I gave her a sample of Stiolto but she read through the side effect profile and is concerned about dizziness, effect on the brain and effect on seizures.  She is compliant with oxygen, dyspnea is at baseline, and review of neurology notes she has some cognitive impairment and emotional lability  Medication review shows captopril ,has occasional tickle in her throat but did not tolerate ARB due to swelling  Significant tests/ events  CT angio chest 07/2015 >>no pulmonary embolism, no emphysema, calcified mediastinal LNs  2010 head CT >>polyps or mucous retention cysts seen within the maxillary sinuses bilaterally  PFTs 09/2015>>severe airway obstruction with FEV1 40%, improves to 47% with albuterol,DLCO decreased to 45%  Review of Systems Patient denies significant dyspnea,cough, hemoptysis,  chest pain, palpitations, pedal edema, orthopnea, paroxysmal nocturnal dyspnea, lightheadedness, nausea, vomiting, abdominal or  leg pains      Objective:   Physical Exam  Gen. Pleasant, obese, in no distress ENT - no lesions, no post nasal drip Neck: No JVD, no thyromegaly, no carotid bruits Lungs: no use of accessory muscles, no dullness to percussion, decreased without rales or rhonchi  Cardiovascular: Rhythm regular, heart sounds  normal, no murmurs or gallops, no peripheral edema Musculoskeletal: No deformities, no cyanosis or  clubbing , no tremors       Assessment & Plan:

## 2017-12-21 NOTE — Patient Instructions (Signed)
Start taking Stiolto after you are done with Anoro. Call me to report after 2 weeks of taking this  Okay to increase oxygen from 2-3 if you are short of breath for 5 minutes, if still short of breath then take 2 puffs of albuterol inhaler

## 2017-12-22 NOTE — Assessment & Plan Note (Signed)
Continue 2 L oxygen. Due to her previous bad experience with pulmonary rehab, she does not want to go back

## 2017-12-22 NOTE — Assessment & Plan Note (Signed)
We had detailed discussion about side effect profile of LABA/LAMA combination.  I explained to her that she is currently on Anoro which is a similar type medication the only difference between Anoro and Stiolto is that one is a dry powder and the other is a mist.  We discussed about anticholinergic side effects including mouth dryness.  I reassured her that it would not affect her diabetes since it does not have a steroid. She finally agreed to trial Stiolto in the new year she already has samples and will call us back if this works for prescription

## 2018-01-28 ENCOUNTER — Other Ambulatory Visit: Payer: Medicare Other

## 2018-02-02 ENCOUNTER — Telehealth: Payer: Self-pay | Admitting: Pulmonary Disease

## 2018-02-02 MED ORDER — TIOTROPIUM BROMIDE-OLODATEROL 2.5-2.5 MCG/ACT IN AERS: 2.0000 | INHALATION_SPRAY | Freq: Every day | RESPIRATORY_TRACT | 0 refills | Status: DC

## 2018-02-02 NOTE — Telephone Encounter (Signed)
Patient returned call, CB is [phone].

## 2018-02-02 NOTE — Telephone Encounter (Signed)
LMTCB

## 2018-02-02 NOTE — Telephone Encounter (Signed)
Spoke with pt, she states she has had days where she feels she is not getting the air she needs. There are days where she has no SOB and thinks the cold air is contributing to this. She doesn't want to give up on the stiolto yet and will try it again for one month to see if its really helping her or not. I sent Rx into the pharmacy and nothing further is needed.   Patient Instructions by Rigoberto Noel, MD at 12/21/2017 3:45 PM  Author: Rigoberto Noel, MD Author Type: Physician Filed: 12/21/2017 4:50 PM  Note Status: Signed Cosign: Cosign Not Required Encounter Date: 12/21/2017  Editor: Rigoberto Noel, MD (Physician)    Start taking Stiolto after you are done with Anoro. Call me to report after 2 weeks of taking this  Okay to increase oxygen from 2-3 if you are short of breath for 5 minutes, if still short of breath then take 2 puffs of albuterol inhaler    Instructions      Return in about 3 months (around 03/22/2018) for TP.  Start taking Stiolto after you are done with Anoro. Call me to report after 2 weeks of taking this  Okay to increase oxygen from 2-3 if you are short of breath for 5 minutes, if still short of breath then take 2 puffs of albuterol inhaler       After Visit Summary (Printed 12/21/2017)

## 2018-02-03 DIAGNOSIS — K5901 Slow transit constipation: Secondary | ICD-10-CM | POA: Diagnosis not present

## 2018-02-03 DIAGNOSIS — R569 Unspecified convulsions: Secondary | ICD-10-CM | POA: Diagnosis not present

## 2018-02-03 DIAGNOSIS — E668 Other obesity: Secondary | ICD-10-CM | POA: Diagnosis not present

## 2018-02-03 DIAGNOSIS — N816 Rectocele: Secondary | ICD-10-CM | POA: Diagnosis not present

## 2018-02-03 DIAGNOSIS — Z9981 Dependence on supplemental oxygen: Secondary | ICD-10-CM | POA: Diagnosis not present

## 2018-02-03 DIAGNOSIS — J449 Chronic obstructive pulmonary disease, unspecified: Secondary | ICD-10-CM | POA: Diagnosis not present

## 2018-02-03 DIAGNOSIS — Z6838 Body mass index (BMI) 38.0-38.9, adult: Secondary | ICD-10-CM | POA: Diagnosis not present

## 2018-02-03 DIAGNOSIS — Z8673 Personal history of transient ischemic attack (TIA), and cerebral infarction without residual deficits: Secondary | ICD-10-CM | POA: Diagnosis not present

## 2018-02-03 DIAGNOSIS — Z1501 Genetic susceptibility to malignant neoplasm of breast: Secondary | ICD-10-CM | POA: Diagnosis not present

## 2018-02-18 ENCOUNTER — Other Ambulatory Visit: Payer: Self-pay | Admitting: Gynecologic Oncology

## 2018-02-18 DIAGNOSIS — Z1501 Genetic susceptibility to malignant neoplasm of breast: Secondary | ICD-10-CM

## 2018-02-18 DIAGNOSIS — Z1509 Genetic susceptibility to other malignant neoplasm: Secondary | ICD-10-CM

## 2018-02-18 DIAGNOSIS — R102 Pelvic and perineal pain: Secondary | ICD-10-CM

## 2018-02-18 NOTE — Progress Notes (Signed)
New order placed for correct order for [place]

## 2018-02-22 ENCOUNTER — Inpatient Hospital Stay: Payer: Medicare Other | Attending: Oncology

## 2018-02-22 ENCOUNTER — Ambulatory Visit (HOSPITAL_COMMUNITY)
Admission: RE | Admit: 2018-02-22 | Discharge: 2018-02-22 | Disposition: A | Discharge status: Home / Self Care | Payer: Medicare Other | Source: Ambulatory Visit | Attending: Obstetrics | Admitting: Obstetrics

## 2018-02-22 DIAGNOSIS — Z1502 Genetic susceptibility to malignant neoplasm of ovary: Secondary | ICD-10-CM | POA: Diagnosis not present

## 2018-02-22 DIAGNOSIS — R102 Pelvic and perineal pain: Secondary | ICD-10-CM | POA: Insufficient documentation

## 2018-02-22 DIAGNOSIS — Z8041 Family history of malignant neoplasm of ovary: Secondary | ICD-10-CM | POA: Insufficient documentation

## 2018-02-22 DIAGNOSIS — R978 Other abnormal tumor markers: Secondary | ICD-10-CM | POA: Insufficient documentation

## 2018-02-22 DIAGNOSIS — Z87891 Personal history of nicotine dependence: Secondary | ICD-10-CM | POA: Insufficient documentation

## 2018-02-22 DIAGNOSIS — Z1501 Genetic susceptibility to malignant neoplasm of breast: Secondary | ICD-10-CM | POA: Diagnosis not present

## 2018-02-22 DIAGNOSIS — Z148 Genetic carrier of other disease: Secondary | ICD-10-CM | POA: Diagnosis not present

## 2018-02-22 DIAGNOSIS — Z1509 Genetic susceptibility to other malignant neoplasm: Secondary | ICD-10-CM | POA: Diagnosis not present

## 2018-02-22 DIAGNOSIS — Z803 Family history of malignant neoplasm of breast: Secondary | ICD-10-CM | POA: Diagnosis not present

## 2018-02-22 DIAGNOSIS — D259 Leiomyoma of uterus, unspecified: Secondary | ICD-10-CM | POA: Diagnosis not present

## 2018-02-22 LAB — CBC WITH DIFFERENTIAL (CANCER CENTER ONLY)
Abs Immature Granulocytes: 0.08 10*3/uL — ABNORMAL HIGH (ref 0.00–0.07)
Basophils Absolute: 0 10*3/uL (ref 0.0–0.1)
Basophils Relative: 0 %
EOS PCT: 2 %
Eosinophils Absolute: 0.2 10*3/uL (ref 0.0–0.5)
HEMATOCRIT: 40.3 % (ref 36.0–46.0)
HEMOGLOBIN: 13.5 g/dL (ref 12.0–15.0)
Immature Granulocytes: 1 %
LYMPHS ABS: 1.4 10*3/uL (ref 0.7–4.0)
LYMPHS PCT: 15 %
MCH: 32.2 pg (ref 26.0–34.0)
MCHC: 33.5 g/dL (ref 30.0–36.0)
MCV: 96.2 fL (ref 80.0–100.0)
MONO ABS: 0.9 10*3/uL (ref 0.1–1.0)
Monocytes Relative: 9 %
Neutro Abs: 6.9 10*3/uL (ref 1.7–7.7)
Neutrophils Relative %: 73 %
Platelet Count: 233 10*3/uL (ref 150–400)
RBC: 4.19 MIL/uL (ref 3.87–5.11)
RDW: 12.3 % (ref 11.5–15.5)
WBC Count: 9.4 10*3/uL (ref 4.0–10.5)
nRBC: 0 % (ref 0.0–0.2)

## 2018-02-22 LAB — CMP (CANCER CENTER ONLY)
ALT: 16 U/L (ref 0–44)
AST: 18 U/L (ref 15–41)
Albumin: 3.8 g/dL (ref 3.5–5.0)
Alkaline Phosphatase: 55 U/L (ref 38–126)
Anion gap: 11 (ref 5–15)
BUN: 9 mg/dL (ref 8–23)
CHLORIDE: 99 mmol/L (ref 98–111)
CO2: 28 mmol/L (ref 22–32)
CREATININE: 0.85 mg/dL (ref 0.44–1.00)
Calcium: 9.5 mg/dL (ref 8.9–10.3)
GFR, Est AFR Am: >60 mL/min (ref >60)
GFR, Estimated: >60 mL/min (ref >60)
Glucose, Bld: 114 mg/dL — ABNORMAL HIGH (ref 70–99)
Potassium: 4 mmol/L (ref 3.5–5.1)
SODIUM: 138 mmol/L (ref 135–145)
Total Bilirubin: 0.6 mg/dL (ref 0.3–1.2)
Total Protein: 7.7 g/dL (ref 6.5–8.1)

## 2018-02-23 LAB — CA 125: Cancer Antigen (CA) 125: 14.2 U/mL (ref 0.0–38.1)

## 2018-02-24 ENCOUNTER — Encounter: Payer: Self-pay | Admitting: Obstetrics

## 2018-02-24 ENCOUNTER — Inpatient Hospital Stay (HOSPITAL_BASED_OUTPATIENT_CLINIC_OR_DEPARTMENT_OTHER): Payer: Medicare Other | Admitting: Obstetrics

## 2018-02-24 ENCOUNTER — Inpatient Hospital Stay: Payer: Medicare Other | Admitting: Obstetrics

## 2018-02-24 VITALS — BP 142/70 | HR 74 | Temp 98.8°F | Resp 18 | Ht 65.0 in | Wt 239.0 lb

## 2018-02-24 DIAGNOSIS — Z803 Family history of malignant neoplasm of breast: Secondary | ICD-10-CM

## 2018-02-24 DIAGNOSIS — Z148 Genetic carrier of other disease: Secondary | ICD-10-CM

## 2018-02-24 DIAGNOSIS — Z1501 Genetic susceptibility to malignant neoplasm of breast: Secondary | ICD-10-CM

## 2018-02-24 DIAGNOSIS — R978 Other abnormal tumor markers: Secondary | ICD-10-CM

## 2018-02-24 DIAGNOSIS — Z87891 Personal history of nicotine dependence: Secondary | ICD-10-CM | POA: Diagnosis not present

## 2018-02-24 DIAGNOSIS — Z1502 Genetic susceptibility to malignant neoplasm of ovary: Secondary | ICD-10-CM | POA: Diagnosis not present

## 2018-02-24 DIAGNOSIS — Z8041 Family history of malignant neoplasm of ovary: Secondary | ICD-10-CM

## 2018-02-24 DIAGNOSIS — R102 Pelvic and perineal pain: Secondary | ICD-10-CM

## 2018-02-24 DIAGNOSIS — Z1509 Genetic susceptibility to other malignant neoplasm: Principal | ICD-10-CM

## 2018-02-24 NOTE — Patient Instructions (Addendum)
    From your visit with Dr. [place] Pink: Please try to cut the Citrucel to half a tablespoon  Try taking the Citrucel earlier in the evening so that you can take your blood pressure medication on an empty stomach    Return in 6 months with Ultrasound, bloodwork and visit same day

## 2018-02-24 NOTE — Progress Notes (Signed)
Progress Note : Established Patient FOLLOW-UP  Beth [name] 78 y.o. female  CC:  Chief Complaint  Patient presents with  . BRCA Mutation    Follow up    HPI:  She was initially seen in consultation by Dr. Alycia Rossetti for BRCA 2 gene positivity at the request of Dr. Jana Hakim. She opted for testing for BRCA mutation due to her strong family history of breast and ovarian cancer.  Of note, her daughter was found to be positive after being diagnosed with breast cancer.  Per history, her paternal grandmother had ovarian cancer age of 66 and her paternal aunt had ovarian cancer at the age of 45.  Past medical history includes COPD with being oxygen dependent 24 hours a day. She has also had a stroke which has caused her to have a seizure disorder which is fairly well managed on Kepra. She suffers from fairly significant short term memory loss per her report. She took OCPs for about 10-15 years total. She went through menopause in her late 38s and took HRT briefly but stopped with results from the nurses health study. She has never had any abdominal surgery. She had been having ultrasounds and CA-125s done with Dr. Quincy Simmonds.  She was initially seen for consideration of risk reducing bilateral salpingo-oophorectomy but it was determined that surgery was too risky after pulmonary and neurology input.  It was determined that surveillance ultrasounds and CA 125 levels performed every 6 months would be safer for the patient.    Interval History:  She presents today for follow   BRCA2 surveillance imaging with ultrasound and CA125.   We reviewed the 02/2018 TVUS report with no findings of concern. (Fibroid)  CA125 02/2018 normal  Rectocele with need to splint  She tried the vaginal dilator to help with the splinting but that was minimally useful and she is now considering purchasing nonmedical devices to help.  She visited with urogynecology and has tried some dietary modification with some success.  Has  noted some increased bloating which she attributes to the dietary modification  Radiology 09/2016 - TVUS ovaries not seen, no adnexal mass 02/2017 - TVUS ovaries not seen, no adnexal mass 08/2017 -  US Pelvic Complete With Transvaginal  Result Date: 02/22/2018 CLINICAL DATA:  BRCA 2 patient.  Ovarian cancer screening. EXAM: TRANSABDOMINAL AND TRANSVAGINAL ULTRASOUND OF PELVIS TECHNIQUE: Both transabdominal and transvaginal ultrasound examinations of the pelvis were performed. Transabdominal technique was performed for global imaging of the pelvis including uterus, ovaries, adnexal regions, and pelvic cul-de-sac. It was necessary to proceed with endovaginal exam following the transabdominal exam to visualize the endometrium and ovaries. COMPARISON:  August 27, 2017 FINDINGS: Uterus Measurements: 4.8 x 2.2 x 3.8 cm = volume: 21.9 mL. Contains a 1.5 cm partially calcified fibroid posteriorly in the fundus. Endometrium Thickness: 1.6 mm.  No focal abnormality visualized. Right ovary Not visualized. Left ovary Not visualized. Other findings No abnormal free fluid. IMPRESSION: 1. Neither ovary was visualized. 2. Calcified fibroid in the posterior fundus. Electronically Signed   By: Dorise Bullion III M.D   On: 02/22/2018 11:46   Current Meds:  Outpatient Encounter Medications as of 02/24/2018  Medication Sig  . albuterol (PROVENTIL HFA;VENTOLIN HFA) 108 (90 Base) MCG/ACT inhaler Inhale 2 puffs into the lungs every 4 (four) hours as needed for wheezing or shortness of breath.  Marland Kitchen aspirin EC 81 MG tablet Take 81 mg by mouth daily.  Marland Kitchen atenolol (TENORMIN) 50 MG tablet TAKE 1/2 TABLET BY MOUTH TWICE  A DAY  . captopril (CAPOTEN) 50 MG tablet Take 50 mg by mouth 2 (two) times daily.  . Cholecalciferol (VITAMIN D3) 2000 units capsule 1 tablet by mouth daily  . clonazePAM (KLONOPIN) 2 MG tablet Take 1 tablet (2 mg total) by mouth at bedtime.  . fexofenadine (ALLEGRA) 180 MG tablet Take 180 mg by mouth daily as  needed.   . fluticasone (FLONASE) 50 MCG/ACT nasal spray Place 2 sprays into the nose daily as needed for allergies. Spray twice daily as needed  . guaiFENesin (MUCINEX) 600 MG 12 hr tablet Take 1,200 mg by mouth 2 (two) times daily as needed for cough.   Marland Kitchen KEPPRA 750 MG tablet Take 1 tablet (750 mg total) by mouth 2 (two) times daily.  Marland Kitchen LEXAPRO 20 MG tablet Take 1 tablet (20 mg total) by mouth daily. Morning dosage  . meloxicam (MOBIC) 15 MG tablet 1 tablet as needed  . simvastatin (ZOCOR) 10 MG tablet Take 10 mg by mouth at bedtime.  . Tiotropium Bromide-Olodaterol (STIOLTO RESPIMAT) 2.5-2.5 MCG/ACT AERS Inhale 2 puffs into the lungs daily.  Marland Kitchen triamterene-hydrochlorothiazide (MAXZIDE-25) 37.5-25 MG tablet Take 1 tablet by mouth daily.  . [DISCONTINUED] Tiotropium Bromide-Olodaterol (STIOLTO RESPIMAT IN) Inhale into the lungs daily.  . [DISCONTINUED] umeclidinium-vilanterol (ANORO ELLIPTA) 62.5-25 MCG/INH AEPB Inhale 1 puff daily   No facility-administered encounter medications on file as of 02/24/2018.     Allergy:  Allergies  Allergen Reactions  . Prozac [Fluoxetine Hcl] Other (See Comments)    seizures  . Sulfa Antibiotics Nausea And Vomiting and Other (See Comments)    TINGLING IN LEGS ALSO  . Micardis [Telmisartan] Swelling and Other (See Comments)    Swelling, bruising Swelling, bruising  . Prednisone Other (See Comments)    mood changes Mood changes  . Lisinopril-Hydrochlorothiazide Other (See Comments)    unknown  . Augmentin [Amoxicillin-Pot Clavulanate] Rash  . Avelox [Moxifloxacin Hcl In Nacl] Rash    LEVAQUIN IS OK-SEE NOTE  . Norvasc [Amlodipine Besylate] Other (See Comments)    fatigue    Social Hx:   Social History   Socioeconomic History  . Marital status: Married    Spouse name: Jenny Reichmann  . Number of children: 2  . Years of education: 57  . Highest education level: Not on file  Occupational History  . Not on file  Social Needs  . Financial resource  strain: Not on file  . Food insecurity:    Worry: Not on file    Inability: Not on file  . Transportation needs:    Medical: Not on file    Non-medical: Not on file  Tobacco Use  . Smoking status: Former Smoker    Packs/day: 1.00    Years: 41.00    Pack years: 41.00    Last attempt to quit: 01/13/2006    Years since quitting: 12.1  . Smokeless tobacco: Never Used  Substance and Sexual Activity  . Alcohol use: No    Alcohol/week: 0.0 standard drinks  . Drug use: No  . Sexual activity: Never    Partners: Male    Birth control/protection: Post-menopausal  Lifestyle  . Physical activity:    Days per week: Not on file    Minutes per session: Not on file  . Stress: Not on file  Relationships  . Social connections:    Talks on phone: Not on file    Gets together: Not on file    Attends religious service: Not on file    Active  member of club or organization: Not on file    Attends meetings of clubs or organizations: Not on file    Relationship status: Not on file  . Intimate partner violence:    Fear of current or ex partner: Not on file    Emotionally abused: Not on file    Physically abused: Not on file    Forced sexual activity: Not on file  Other Topics Concern  . Not on file  Social History Narrative   Patient is married ([name]) and lives at home with her husband.   Patient is retired.   Patient has two children.   Patient has a high school education.   Patient is right-handed.   Patient drinks 2 big mugs of coffee daily and sometimes tea in the evenings.    Past Surgical Hx:  Past Surgical History:  Procedure Laterality Date  . broken ankle Right   . CEREBRAL ANEURYSM REPAIR    . TONSILLECTOMY      Past Medical Hx:  Past Medical History:  Diagnosis Date  . BRCA2 positive 2017  . Diverticulosis   . Hearing loss   . HTN (hypertension)   . Memory loss   . 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  Seizures ([place])   . Stroke, hemorrhagic ([place]) aneurysm bleed.   . Vestibulitis of ear     Family Hx:  Family History  Problem Relation Age of Onset  . Dementia Mother   . Stroke Mother   . Thyroid disease Mother   . Osteoporosis Mother   . Ovarian cancer Paternal Grandmother 26  . Ovarian cancer Paternal Aunt 16  . Breast cancer Daughter 18       breast ca x2, bilateral dx. 38, 46--estrogen  . Other Daughter        no genetic testing as of 07/2015; has had ovaries removed  . Other Daughter        BRCA 2 Pos.  . Colon cancer Maternal Aunt        dx. unspecified age  . Emphysema Maternal Uncle   . High blood pressure Maternal Grandmother   . Dementia Maternal Grandfather   . Bleeding Disorder Maternal Grandfather   . Emphysema Maternal Aunt   . Emphysema Maternal Aunt    Review of Systems  Gastrointestinal: Positive for constipation.  Skin: Positive for itching.  Psychiatric/Behavioral: Positive for decreased concentration.  All other systems reviewed and are negative. + bloating   Vitals:  Blood pressure (!) 142/70, pulse 74, temperature 98.8 F (37.1 C), temperature source Oral, resp. rate 18, height '5\' 5"'  (1.651 m), weight 239 lb (108.4 kg), last menstrual period 01/14/1995, SpO2 97 %.[place] 09/17/16:  Physical Exam:   General :  Obese Well developed, 78 y.o., female in no apparent distress HEENT:  Normocephalic/atraumatic, symmetric, EOMI, eyelids normal Neck:   Supple, no masses.  Lymphatics:  No cervical/ submandibular/ supraclavicular/ infraclavicular/ inguinal adenopathy Respiratory:  Respirations unlabored, no use of accessory muscles CV:   Deferred Breast:  Deferred Musculoskeletal: No CVA tenderness, normal muscle strength. Abdomen:  Obese Soft, non-tender and nondistended. No evidence of hernia. No masses. Extremities:  No lymphedema, no erythema, non-tender. Skin:   Normal inspection Neuro/Psych:  No focal motor deficit, no abnormal mental status. Normal gait.  Normal affect. Alert and oriented to person, place, and time  Genito Urinary: Vulva: Normal external female genitalia.  Bladder/urethra: Urethral meatus normal in size and location. No lesions or  masses, well supported bladder Speculum exam: Vagina: No lesion, no discharge, no bleeding. Cervix: Normal appearing, no lesions. Bimanual exam:  Uterus: Unable to delineate  Adnexa: No masses. Rectovaginal:  Mild rectocele. Good tone, no masses, no cul de sac nodularity, no parametrial involvement or nodularity.  Assessment/Plan:  78 y.o. female  1. BRCA2 mutation who has a complicated medical history with COPD, BMI 42, s/p CVA with residual seizure disorder.  Continue with surveillance since surgery would pose significant risk to patient.  [place] without concerning features and CA 125 within normal range.    Plan Q6 month pelvic, CA125 and TVUS  She asked we schedule these all in one day and understands we won't know the CA125 result the same day but can call her with that. 2. Rectocele with splinting  Dietary modification as per urogyn  Alternative if still an issue is a pelvic PTx referral.  Face to face time with patient was 15 minutes. Over 50% of this time was spent on counseling and coordination of care.  Isabel Caprice, MD 02/24/2018, 4:58 PM   Cc: Kathyrn Lass, MD (PCP)

## 2018-03-01 ENCOUNTER — Other Ambulatory Visit: Payer: Self-pay | Admitting: Pulmonary Disease

## 2018-03-02 ENCOUNTER — Other Ambulatory Visit: Payer: Self-pay | Admitting: Pulmonary Disease

## 2018-03-02 ENCOUNTER — Telehealth: Payer: Self-pay | Admitting: Pulmonary Disease

## 2018-03-02 MED ORDER — TIOTROPIUM BROMIDE-OLODATEROL 2.5-2.5 MCG/ACT IN AERS: 2.0000 | INHALATION_SPRAY | Freq: Every day | RESPIRATORY_TRACT | 2 refills | Status: DC

## 2018-03-02 NOTE — Telephone Encounter (Signed)
Spoke with patient. She had called in earlier today to request a refill on the Stiolto. She was calling back to cancel this request as she has decided to no longer take the [place]. She stated that the medication was way too expensive. She was previously on Anoro and this worked well for her.   She wants to know if she can switch back to Anoro.   Pharmacy is [facility] on [address].   RA, please advise if you are ok with this. Thanks!

## 2018-03-03 ENCOUNTER — Other Ambulatory Visit: Payer: Self-pay | Admitting: Adult Health

## 2018-03-03 MED ORDER — UMECLIDINIUM-VILANTEROL 62.5-25 MCG/INH IN AEPB: 1.0000 | INHALATION_SPRAY | Freq: Every day | RESPIRATORY_TRACT | 6 refills | Status: DC

## 2018-03-03 NOTE — Telephone Encounter (Signed)
Called the patient apologized for the  Mistake and sent in the right rx and d/c the stiolto .Nothing further needed at this time.

## 2018-03-03 NOTE — Telephone Encounter (Signed)
Okay to switch back to Anoro. [name]

## 2018-03-03 NOTE — Telephone Encounter (Signed)
Pt states Stillto has not been D/C and was called back in to the pharmacy CB# [phone]//[phone]

## 2018-04-07 ENCOUNTER — Other Ambulatory Visit: Payer: Self-pay

## 2018-04-07 ENCOUNTER — Telehealth (INDEPENDENT_AMBULATORY_CARE_PROVIDER_SITE_OTHER): Payer: Medicare Other | Admitting: Primary Care

## 2018-04-07 DIAGNOSIS — J9611 Chronic respiratory failure with hypoxia: Secondary | ICD-10-CM

## 2018-04-07 DIAGNOSIS — J449 Chronic obstructive pulmonary disease, unspecified: Secondary | ICD-10-CM | POA: Diagnosis not present

## 2018-04-07 MED ORDER — ALBUTEROL SULFATE HFA 108 (90 BASE) MCG/ACT IN AERS: 2.0000 | INHALATION_SPRAY | RESPIRATORY_TRACT | 5 refills | Status: DC | PRN

## 2018-04-07 NOTE — Patient Instructions (Signed)
Thank you for allowing me to speak with you today for your 6 months follow-up visit regarding your COPD   COPD - Continue Anoro 1 puff daily (try taking in the morning) - Albuterol rescue inhaler 2 puffs every 4-6 hours as needed for shortness of breath or wheezing  Allergic rhinitis - Take Allegra during allergy season - Continue Flonase nasal spray  Chronic respiratory failure with hypoxia - Continue 2-3L oxygen nasal canula  - May look into switching DME company for continuous portable oxygen concentrator   Follow-up: - June with NP - August with Dr. Elsworth Soho  - Sooner if you develop fever >100, worsening shortness of breath/wheezing or cough    Chronic Obstructive Pulmonary Disease Chronic obstructive pulmonary disease (COPD) is a long-term (chronic) lung problem. When you have COPD, it is hard for air to get in and out of your lungs. Usually the condition gets worse over time, and your lungs will never return to normal. There are things you can do to keep yourself as healthy as possible.  Your doctor may treat your condition with: ? Medicines. ? Oxygen. ? Lung surgery.  Your doctor may also recommend: ? Rehabilitation. This includes steps to make your body work better. It may involve a team of specialists. ? Quitting smoking, if you smoke. ? Exercise and changes to your diet. ? Comfort measures (palliative care). Follow these instructions at home: Medicines  Take over-the-counter and prescription medicines only as told by your doctor.  Talk to your doctor before taking any cough or allergy medicines. You may need to avoid medicines that cause your lungs to be dry. Lifestyle  If you smoke, stop. Smoking makes the problem worse. If you need help quitting, ask your doctor.  Avoid being around things that make your breathing worse. This may include smoke, chemicals, and fumes.  Stay active, but remember to rest as well.  Learn and use tips on how to relax.  Make sure you  get enough sleep. Most adults need at least 7 hours of sleep every night.  Eat healthy foods. Eat smaller meals more often. Rest before meals. Controlled breathing Learn and use tips on how to control your breathing as told by your doctor. Try:  Breathing in (inhaling) through your nose for 1 second. Then, pucker your lips and breath out (exhale) through your lips for 2 seconds.  Putting one hand on your belly (abdomen). Breathe in slowly through your nose for 1 second. Your hand on your belly should move out. Pucker your lips and breathe out slowly through your lips. Your hand on your belly should move in as you breathe out.  Controlled coughing Learn and use controlled coughing to clear mucus from your lungs. Follow these steps: 1. Lean your head a little forward. 2. Breathe in deeply. 3. Try to hold your breath for 3 seconds. 4. Keep your mouth slightly open while coughing 2 times. 5. Spit any mucus out into a tissue. 6. Rest and do the steps again 1 or 2 times as needed. General instructions  Make sure you get all the shots (vaccines) that your doctor recommends. Ask your doctor about a flu shot and a pneumonia shot.  Use oxygen therapy and pulmonary rehabilitation if told by your doctor. If you need home oxygen therapy, ask your doctor if you should buy a tool to measure your oxygen level (oximeter).  Make a COPD action plan with your doctor. This helps you to know what to do if you feel worse  than usual.  Manage any other conditions you have as told by your doctor.  Avoid going outside when it is very hot, cold, or humid.  Avoid people who have a sickness you can catch (contagious).  Keep all follow-up visits as told by your doctor. This is important. Contact a doctor if:  You cough up more mucus than usual.  There is a change in the color or thickness of the mucus.  It is harder to breathe than usual.  Your breathing is faster than usual.  You have trouble sleeping.   You need to use your medicines more often than usual.  You have trouble doing your normal activities such as getting dressed or walking around the house. Get help right away if:  You have shortness of breath while resting.  You have shortness of breath that stops you from: ? Being able to talk. ? Doing normal activities.  Your chest hurts for longer than 5 minutes.  Your skin color is more blue than usual.  Your pulse oximeter shows that you have low oxygen for longer than 5 minutes.  You have a fever.  You feel too tired to breathe normally. Summary  Chronic obstructive pulmonary disease (COPD) is a long-term lung problem.  The way your lungs work will never return to normal. Usually the condition gets worse over time. There are things you can do to keep yourself as healthy as possible.  Take over-the-counter and prescription medicines only as told by your doctor.  If you smoke, stop. Smoking makes the problem worse. This information is not intended to replace advice given to you by your health care provider. Make sure you discuss any questions you have with your health care provider.   Allergies, Adult An allergy means that your body reacts to something that bothers it (allergen). It is not a normal reaction. This can happen from something that you:  Eat.  Breathe in.  Touch. You can have an allergy (be allergic) to:  Outdoor things, like: ? Pollen. ? Grass. ? Weeds.  Indoor things, like: ? Dust. ? Smoke. ? Pet dander.  Foods.  Medicines.  Things that bother your skin, like: ? Detergents. ? Chemicals. ? Latex.  Perfume.  Bugs. An allergy cannot spread from person to person (is not contagious). Follow these instructions at home:         Stay away from things that you know you are allergic to.  If you have allergies to things in the air, wash out your nose each day. Do it with one of these: ? A salt-water (saline) spray. ? A container  (neti pot).  Take over-the-counter and prescription medicines only as told by your doctor.  Keep all follow-up visits as told by your doctor. This is important.  If you are at risk for a very bad allergy reaction (anaphylaxis), keep an auto-injector with you all the time. This is called an epinephrine injection. ? This is pre-measured medicine with a needle. You can put it into your skin by yourself. ? Right after you have a very bad allergy reaction, you or a person with you must give the medicine in less than a few minutes. This is an emergency.  If you have ever had a very bad allergy reaction, wear a medical alert bracelet or necklace. Your very bad allergy should be written on it. Contact a health care provider if:  Your symptoms do not get better with treatment. Get help right away if:  You have  symptoms of a very bad allergy reaction. These include: ? A swollen mouth, tongue, or throat. ? Pain or tightness in your chest. ? Trouble breathing. ? Being short of breath. ? Dizziness. ? Fainting. ? Very bad pain in your belly (abdomen). ? Throwing up (vomiting). ? Watery poop (diarrhea). Summary  An allergy means that your body reacts to something that bothers it (allergen). It is not a normal reaction.  Stay away from things that make your body react.  Take over-the-counter and prescription medicines only as told by your doctor.  If you are at risk for a very bad allergy reaction, carry an auto-injector (epinephrine injection) all the time. Also, wear a medical alert bracelet or necklace so people know about your allergy. This information is not intended to replace advice given to you by your health care provider. Make sure you discuss any questions you have with your health care provider. Document Released: 04/26/2012 Document Revised: 04/14/2016 Document Reviewed: 04/14/2016 Elsevier Interactive Patient Education  2019 [place] Released: 06/18/2007 Document  Revised: 02/04/2016 Document Reviewed: 02/04/2016 Elsevier Interactive Patient Education  2019 [place] (COVID-19) Are you at risk?  Are you at risk for the Coronavirus (COVID-19)?  To be considered HIGH RISK for Coronavirus (COVID-19), you have to meet the following criteria:  . Traveled to [place], [place], [place], [place] or [place]; or in the [place] to [place], [place], [place], or [place]; and have fever, cough, and shortness of breath within the last 2 weeks of travel OR . Been in close contact with a person diagnosed with COVID-19 within the last 2 weeks and have fever, cough, and shortness of breath . IF YOU DO NOT MEET THESE CRITERIA, YOU ARE CONSIDERED LOW RISK FOR COVID-19.  What to do if you are HIGH RISK for COVID-19?  Marland Kitchen If you are having a medical emergency, call 911. . Seek medical care right away. Before you go to a doctor's office, urgent care or emergency department, call ahead and tell them about your recent travel, contact with someone diagnosed with COVID-19, and your symptoms. You should receive instructions from your physician's office regarding next steps of care.  . When you arrive at healthcare provider, tell the healthcare staff immediately you have returned from visiting [place], [place], [place], [place] or [place]; or traveled in the [place] to [place], [place], [place], or [place]; in the last two weeks or you have been in close contact with a person diagnosed with COVID-19 in the last 2 weeks.   . Tell the health care staff about your symptoms: fever, cough and shortness of breath. . After you have been seen by a medical provider, you will be either: o Tested for (COVID-19) and discharged home on quarantine except to seek medical care if symptoms worsen, and asked to  - Stay home and avoid contact with others until you get your results (4-5 days)  - Avoid travel on public transportation if possible (such as bus,  train, or airplane) or o Sent to the Emergency Department by EMS for evaluation, COVID-19 testing, and possible admission depending on your condition and test results.  What to do if you are LOW RISK for COVID-19?  Reduce your risk of any infection by using the same precautions used for avoiding the common cold or flu:  Marland Kitchen Wash your hands often with soap and warm water for at least 20 seconds.  If soap and water are  not readily available, use an alcohol-based hand sanitizer with at least 60% alcohol.  . If coughing or sneezing, cover your mouth and nose by coughing or sneezing into the elbow areas of your shirt or coat, into a tissue or into your sleeve (not your hands). . Avoid shaking hands with others and consider head nods or verbal greetings only. . Avoid touching your eyes, nose, or mouth with unwashed hands.  . Avoid close contact with people who are sick. . Avoid places or events with large numbers of people in one location, like concerts or sporting events. . Carefully consider travel plans you have or are making. . If you are planning any travel outside or inside the [place], visit the CDC's Travelers' Health webpage for the latest health notices. . If you have some symptoms but not all symptoms, continue to monitor at home and seek medical attention if your symptoms worsen. . If you are having a medical emergency, call 911.   [place] / e-Visit: [email or web address]         [facility]: [facility]: [phone]                   [facility]: [phone]

## 2018-04-07 NOTE — Progress Notes (Signed)
Virtual Visit via Telephone Note  I connected with Beth [name] on 04/07/18 at  4:30 PM EDT by telephone and verified that I am speaking with the correct person using two identifiers.   I discussed the limitations, risks, security and privacy concerns of performing an evaluation and management service by telephone and the availability of in person appointments. I also discussed with the patient that there may be a patient responsible charge related to this service. The patient expressed understanding and agreed to proceed.   Patient is at home, agreeing with [employer]. Myself and Chad Cordial present for phone call today. My nurse Lattie Haw to make follow up visit and mail AVS.   History of Present Illness: 78 year old female, former smoker. PMH significant for COPD, chronic respiratory failure, HTN, organic brain syndrome, stroke, BRCA2 positive, Obesity. Patient of Dr. Elsworth Soho, last seen in office on 12/21/17. She was started on Stiolto during her last visit, however, patient decided to change back to [employer] d/t cost.   Patient contacted by phone call today for 6 month follow-up. She is doing well, breathing is stable. She continues taking Anoro 1 puff daily, states that she takes it at night. Has Albuterol rescue inhaler on hand but has not required it recently. States that she had wheezing symptoms after a gathering with friends. Denies fever. She has since recovered. She is now dealing with allergy symptoms. Complains of PND. She started taking Allegra recently and continues Flonase nasal spray. Continues 2L oxygen, reports 02 sat average 96-98%. She is unhappy with oxygen company and did not do well with pulsed oxygen concentrator that she was given. She is mainly staying indoor right now and will consider changing DME company in the summer at next office visit. [facility]   Observations/Objective:  - No shortness of breath or wheezing noted during  conversation - Occasional clearing mucus from her throat   Assessment and Plan:  COPD - Continue Anoro 1 puff daily (advsied patient take in am) - Albuterol rescue inhaler every 4-6 hours prn sob/wheezing  Allergic rhinitis - Take Allegra during allergy season - Continue Flonase nasal spray - Avoid allergens   Chronic respiratory failure with hypoxia - Continue 2-3L oxygen [place] - O2 saturation reported 96-98% 2L - May look into switching DME company for continuous portable oxygen concentrator   Follow Up Instructions: - June with NP - August with Dr. Elsworth Soho    I discussed the assessment and treatment plan with the patient. The patient was provided an opportunity to ask questions and all were answered. The patient agreed with the plan and demonstrated an understanding of the instructions.   The patient was advised to call back or seek an in-person evaluation if the symptoms worsen or if the condition fails to improve as anticipated.  I provided 35 minutes of non-face-to-face time during this encounter. Phone call 4:30-5:05pm.  Martyn Ehrich, NP

## 2018-04-13 ENCOUNTER — Telehealth: Payer: Self-pay | Admitting: Primary Care

## 2018-04-13 MED ORDER — UMECLIDINIUM-VILANTEROL 62.5-25 MCG/INH IN AEPB: 1.0000 | INHALATION_SPRAY | Freq: Every day | RESPIRATORY_TRACT | 1 refills | Status: DC

## 2018-04-13 NOTE — Telephone Encounter (Signed)
Returned call to verify pharmacy. Spouse wants rx sent to [facility]. Rx sent. Nothing further needed.

## 2018-04-15 ENCOUNTER — Ambulatory Visit: Payer: Medicare Other | Admitting: Neurology

## 2018-04-19 ENCOUNTER — Other Ambulatory Visit: Payer: Self-pay

## 2018-04-19 MED ORDER — UMECLIDINIUM-VILANTEROL 62.5-25 MCG/INH IN AEPB: 1.0000 | INHALATION_SPRAY | Freq: Every day | RESPIRATORY_TRACT | 1 refills | Status: DC

## 2018-06-16 DIAGNOSIS — M2550 Pain in unspecified joint: Secondary | ICD-10-CM | POA: Diagnosis not present

## 2018-06-16 DIAGNOSIS — R569 Unspecified convulsions: Secondary | ICD-10-CM | POA: Diagnosis not present

## 2018-06-16 DIAGNOSIS — F3341 Major depressive disorder, recurrent, in partial remission: Secondary | ICD-10-CM | POA: Diagnosis not present

## 2018-06-16 DIAGNOSIS — J9611 Chronic respiratory failure with hypoxia: Secondary | ICD-10-CM | POA: Diagnosis not present

## 2018-06-16 DIAGNOSIS — E7849 Other hyperlipidemia: Secondary | ICD-10-CM | POA: Diagnosis not present

## 2018-06-16 DIAGNOSIS — J449 Chronic obstructive pulmonary disease, unspecified: Secondary | ICD-10-CM | POA: Diagnosis not present

## 2018-06-16 DIAGNOSIS — I10 Essential (primary) hypertension: Secondary | ICD-10-CM | POA: Diagnosis not present

## 2018-06-16 DIAGNOSIS — R7303 Prediabetes: Secondary | ICD-10-CM | POA: Diagnosis not present

## 2018-07-07 ENCOUNTER — Encounter: Payer: Self-pay | Admitting: Primary Care

## 2018-07-07 ENCOUNTER — Other Ambulatory Visit: Payer: Self-pay

## 2018-07-07 ENCOUNTER — Ambulatory Visit (INDEPENDENT_AMBULATORY_CARE_PROVIDER_SITE_OTHER): Payer: Medicare Other | Admitting: Primary Care

## 2018-07-07 DIAGNOSIS — J449 Chronic obstructive pulmonary disease, unspecified: Secondary | ICD-10-CM

## 2018-07-07 MED ORDER — MONTELUKAST SODIUM 10 MG PO TABS: 10.0000 mg | ORAL_TABLET | Freq: Every day | ORAL | 2 refills | Status: DC

## 2018-07-07 NOTE — Patient Instructions (Addendum)
Continue Anoro 1 puff daily  Albuterol rescue inhaler 2 puffs every 4-6 hours as needed for shortness of breath/wheezing  Use nasal spray twice daily followed by [place] gel  As needed allegra, mucinex and flonase  Rx: Trial Singulair 10mg  at bedtime  Follow up with August/Sept with Dr. Elsworth Soho

## 2018-07-07 NOTE — Progress Notes (Signed)
Virtual Visit via Telephone Note  I connected with Beth [name] on 07/07/18 at  1:30 PM EDT by telephone and verified that I am speaking with the correct person using two identifiers.  Location: Patient: Home Provider: Office   I discussed the limitations, risks, security and privacy concerns of performing an evaluation and management service by telephone and the availability of in person appointments. I also discussed with the patient that there may be a patient responsible charge related to this service. The patient expressed understanding and agreed to proceed.   History of Present Illness: 78 year old female, former smoker. PMH significant for COPD, chronic respiratory failure, HTN, organic brain syndrome, stroke, BRCA2 positive, Obesity. Patient of Dr. Alva, last seen in office on 12/21/17. She was started on Stiolto during her last visit, however, patient decided to change back to Anoro d/t cost.   Previous [place] Encounter: 04/07/18 Patient contacted by phone call today for 6 month follow-up. She is doing well, breathing is stable. She continues taking Anoro 1 puff daily, states that she takes it at night. Has Albuterol rescue inhaler on hand but has not required it recently. States that she had wheezing symptoms after a gathering with friends. Denies fever. She has since recovered. She is now dealing with allergy symptoms. Complains of PND. She started taking Allegra recently and continues Flonase nasal spray. Continues 2L oxygen, reports 02 sat average 96-98%. She is unhappy with oxygen company and did not do well with pulsed oxygen concentrator that she was given. She is mainly staying indoor right now and will consider changing DME company in the summer at next office visit. [facility].   07/07/2018 Patient contacted today for 3 month follow-up. She is doing pretty good. Complains of mucus in the back of her throat. Not using allegra,  mucinex or flonase. Using saline nasal rinses. Breathing well. Sitting, talking and laughing with no breathing difficulties. Mild dyspnea on exertion. Uses 2l oxygen on exertion. She has not needed to use her rescue inhaler.   Observations/Objective:  - No shortness of breath, wheezing or cough observed during phone conversation   Assessment and Plan:  COPD - Stable, rare SABA use  - Continue Anoro 1 puff daily and prn albuterol hfa   Chronic respiratory failure - Stable, using 2L oxygen on exertion - Consider switching DME company in the future for POC when she is more active   Allergic rhinitis - Trial Singulair 10mg at bedtime  - Continue saline nasal spray twice daily - PRN mucinex, allegra or flonase   Follow Up Instructions:  - FU in August with Dr. Alva  I discussed the assessment and treatment plan with the patient. The patient was provided an opportunity to ask questions and all were answered. The patient agreed with the plan and demonstrated an understanding of the instructions.   The patient was advised to call back or seek an in-person evaluation if the symptoms worsen or if the condition fails to improve as anticipated.  I provided 25 minutes of non-face-to-face time during this encounter.    W , NP   

## 2018-08-27 ENCOUNTER — Telehealth: Payer: Self-pay | Admitting: *Deleted

## 2018-08-27 NOTE — Telephone Encounter (Signed)
Left a message to call the office back to schedule an appt

## 2018-08-30 ENCOUNTER — Telehealth: Payer: Self-pay | Admitting: *Deleted

## 2018-08-30 NOTE — Telephone Encounter (Signed)
Attempted to call the patient to schedule appts. Patient's husband answered phone and asked that we call back later in the week; patient was napping

## 2018-09-01 ENCOUNTER — Telehealth: Payer: Self-pay | Admitting: *Deleted

## 2018-09-01 NOTE — Telephone Encounter (Signed)
Scheduled appts for the patient for [place] scan, lab and MD visit. Called and gave the appts to the patient's husband, along with instructions

## 2018-09-22 ENCOUNTER — Ambulatory Visit (INDEPENDENT_AMBULATORY_CARE_PROVIDER_SITE_OTHER): Payer: Medicare Other | Admitting: Primary Care

## 2018-09-22 ENCOUNTER — Encounter: Payer: Self-pay | Admitting: Primary Care

## 2018-09-22 ENCOUNTER — Other Ambulatory Visit: Payer: Self-pay

## 2018-09-22 DIAGNOSIS — J449 Chronic obstructive pulmonary disease, unspecified: Secondary | ICD-10-CM | POA: Diagnosis not present

## 2018-09-22 DIAGNOSIS — J9611 Chronic respiratory failure with hypoxia: Secondary | ICD-10-CM

## 2018-09-22 MED ORDER — ANORO ELLIPTA 62.5-25 MCG/INH IN AEPB: 1.0000 | INHALATION_SPRAY | Freq: Every day | RESPIRATORY_TRACT | 2 refills | Status: DC

## 2018-09-22 NOTE — Progress Notes (Signed)
Virtual Visit via Video Note  I connected with Beth [name] on 09/22/18 at  3:30 PM EDT by a video enabled telemedicine application and verified that I am speaking with the correct person using two identifiers.  Location: Patient: Home  Provider: Office   I discussed the limitations of evaluation and management by telemedicine and the availability of in person appointments. The patient expressed understanding and agreed to proceed.  History of Present Illness: 78 year old female, former smoker. PMH significant for COPD, chronic respiratory failure, HTN, organic brain syndrome, stroke, BRCA2 positive, Obesity. Patient of Dr. Elsworth Soho, last seen in office on 12/21/17. She was started on Stiolto during her last visit, however, patient decided to change back to Anoro d/t cost.   09/22/2018 Patient contacted today for 3 month follow-up. She is doing well, no acute complaints. Needs refill of her Anoro. She has not needed to use her rescuer inhaler and has not had any recent exacerbations of her COPD. Continues using 2L oxygen. Stopped her maxzide d/t a lower bp reading. States that if she eats a diet with high salt she notices some ankle swelling. Continues Allegra and flonase daily. Taking mucinex as needed for congestion. She has been venturing out of the house a little to meet friends in empty restaurant and wearing mask. Denies fever, cough, sob or wheezing.   Imaging: CTA 08/08/15- No Evidence of PE or suspicious pulmonary nodules. Scarring bases in RML and lingula.    Observations/Objective:   - No wheezing, sob or cough noted during phone conversation  Assessment and Plan:  COPD - Stable course, no recent exacerbations  - Continues Anoro 1 puff daily; rarely requires Albuterol hfa - Mucinex as needed twice daily for congestion  - Refills sent   Chronic respiratory failure - Stable, no increased oxygen demand  - Continues 2L oxygen   Allergic rhinitis - Continues Allegra and flonase  daily - Not currently taking singulair    Follow Up Instructions:   - FU 3-4 months in office with Dr. Elsworth Soho  I discussed the assessment and treatment plan with the patient. The patient was provided an opportunity to ask questions and all were answered. The patient agreed with the plan and demonstrated an understanding of the instructions.   The patient was advised to call back or seek an in-person evaluation if the symptoms worsen or if the condition fails to improve as anticipated.  I provided 15 minutes of non-face-to-face time during this encounter.   Martyn Ehrich, NP

## 2018-09-22 NOTE — Patient Instructions (Addendum)
  No changes today   Continue Anoro 1 puff daily (6 refills sent)  Monitor ankle swelling, notify PCP if you start retaining more fluid or BP is elevated   Follow up in 3 months with Dr. Elsworth Soho

## 2018-10-05 DIAGNOSIS — F3341 Major depressive disorder, recurrent, in partial remission: Secondary | ICD-10-CM | POA: Diagnosis not present

## 2018-10-05 DIAGNOSIS — J9611 Chronic respiratory failure with hypoxia: Secondary | ICD-10-CM | POA: Diagnosis not present

## 2018-10-05 DIAGNOSIS — E7849 Other hyperlipidemia: Secondary | ICD-10-CM | POA: Diagnosis not present

## 2018-10-05 DIAGNOSIS — I10 Essential (primary) hypertension: Secondary | ICD-10-CM | POA: Diagnosis not present

## 2018-10-05 DIAGNOSIS — I499 Cardiac arrhythmia, unspecified: Secondary | ICD-10-CM | POA: Diagnosis not present

## 2018-10-05 DIAGNOSIS — R7303 Prediabetes: Secondary | ICD-10-CM | POA: Diagnosis not present

## 2018-10-05 DIAGNOSIS — M8949 Other hypertrophic osteoarthropathy, multiple sites: Secondary | ICD-10-CM | POA: Diagnosis not present

## 2018-10-12 ENCOUNTER — Telehealth: Payer: Self-pay

## 2018-10-12 NOTE — Telephone Encounter (Signed)
NOTES ON FILE FROM DR Kathyrn Lass [phone], REFERRAL SENT TO SCHEDULING

## 2018-10-19 ENCOUNTER — Telehealth: Payer: Self-pay | Admitting: Cardiovascular Disease

## 2018-10-19 DIAGNOSIS — Z23 Encounter for immunization: Secondary | ICD-10-CM | POA: Diagnosis not present

## 2018-10-19 NOTE — Telephone Encounter (Signed)
New Message   Patient is requesting to switch from Dr. Johnsie Cancel to Dr. Irish Lack. Please advise.

## 2018-10-19 NOTE — Telephone Encounter (Signed)
That's fine

## 2018-10-20 DIAGNOSIS — E7849 Other hyperlipidemia: Secondary | ICD-10-CM | POA: Diagnosis not present

## 2018-10-20 DIAGNOSIS — J9611 Chronic respiratory failure with hypoxia: Secondary | ICD-10-CM | POA: Diagnosis not present

## 2018-10-20 DIAGNOSIS — I10 Essential (primary) hypertension: Secondary | ICD-10-CM | POA: Diagnosis not present

## 2018-10-20 DIAGNOSIS — I499 Cardiac arrhythmia, unspecified: Secondary | ICD-10-CM | POA: Diagnosis not present

## 2018-10-20 DIAGNOSIS — F3341 Major depressive disorder, recurrent, in partial remission: Secondary | ICD-10-CM | POA: Diagnosis not present

## 2018-10-20 DIAGNOSIS — M8949 Other hypertrophic osteoarthropathy, multiple sites: Secondary | ICD-10-CM | POA: Diagnosis not present

## 2018-10-20 DIAGNOSIS — R7303 Prediabetes: Secondary | ICD-10-CM | POA: Diagnosis not present

## 2018-10-21 NOTE — Telephone Encounter (Signed)
OK 

## 2018-10-24 NOTE — Progress Notes (Signed)
Office Visit  Note: Gyn-Onc   Beth [name] 78 y.o. female  CC: Follow-up   Assessment/Plan: This is a 78 y.o. year old with a complicated medical history undergoing surveillance in the setting of a known BRCA2 mutation given that she is a poor surgical candidate.  The patient is overall doing well today without significant complaints.  Her ultrasound today was notable for findings of a very small volume of fluid in the endometrial canal.  Given no symptoms, most notably vaginal bleeding, will plan for continued surveillance.  The patient was asked to call the clinic if she develops vaginal bleeding, pain, cramping or other new symptoms.  Her ovaries were not appreciated on ultrasound.  A CA 125 was drawn and pending.  I will call her with these results.  We will plan to continue every 6 month pelvic exams, CA-125 and ultrasound.  We discussed strategies for vaginal dryness.  The patient continues to want to avoid any sort of estrogen cream but was open to the idea of using natural lubricants.  We also discussed other sources of fiber as she has a much easier time defecating when she takes fiber and laxatives regularly.   R , MD., PhD. 10/24/2018, 3:15 PM   HPI: Beth [name] was initially seen in consultation by Dr. Gehrig for BRCA 2 gene positivity at the request of Dr. Magrinat. She opted for testing for BRCA mutation due to her strong family history of breast and ovarian cancer.  Of note, her daughter was found to be positive after being diagnosed with breast cancer.  Per history, her paternal grandmother had ovarian cancer age of 62 and her paternal aunt had ovarian cancer at the age of 70.  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  Thus  the plan was made for surveillance ultrasounds and Ca1 25 every 6-month.  Interval History:   Patient presents today and overall is doing well.  She has had intermittent palpitations, shortness of breath and leg swelling.  She reached out to her primary care provider and has an appointment to see a cardiologist on the 19th of this month.  She notes some increased depression and anxiety symptoms since Covid.  She continues to use 2 L of oxygen.  Overall, she reports a good appetite without nausea or vomiting.  She continues to have symptoms from her rectocele and feels a bulge often when she needs to defecate.  This is unchanged from her last visit.  If she eats a diet high in fiber and uses laxatives as needed her bowel movements are much easier.  She has occasional stress incontinence but otherwise denies urinary symptoms.  She denies any vaginal discharge or bleeding.  Just after the ultrasound that she had before coming to clinic today, she had a very small amount of watery pink discharge.  She denies any pain, cramping or other symptoms except in association with her colon.  She describes some vaginal dryness that for the most part does not cause her symptoms.  Imaging 09/2016 - TVUS ovaries not seen, no adnexal mass 02/2017 - TVUS ovaries not seen, no adnexal mass 08/2017 - TVUS ovaries not seen, no adnexal mass, continued calcified fibroid 02/2018 - TVUS ovaries not seen, no adnexal mass, continued calcified fibroid (unchanged)  Social Hx:     Social History   Socioeconomic History  . Marital status: Married    Spouse name: [name]  . Number of children: 2  . Years of education: 12  . Highest education level: Not on file  Occupational History  . Not on file  Social Needs  . Financial resource strain: Not on file  . Food insecurity    Worry: Not on file    Inability: Not on file  . Transportation needs    Medical: Not on file    Non-medical: Not on file  Tobacco Use  . Smoking status:  Former Smoker    Packs/day: 1.00    Years: 41.00    Pack years: 41.00    Quit date: 01/13/2006    Years since quitting: 12.7  . Smokeless tobacco: Never Used  Substance and Sexual Activity  . Alcohol use: No    Alcohol/week: 0.0 standard drinks  . Drug use: No  . Sexual activity: Never    Partners: Male    Birth control/protection: Post-menopausal  Lifestyle  . Physical activity    Days per week: Not on file    Minutes per session: Not on file  . Stress: Not on file  Relationships  . Social connections    Talks on phone: Not on file    Gets together: Not on file    Attends religious service: Not on file    Active member of club or organization: Not on file    Attends meetings of clubs or organizations: Not on file    Relationship status: Not on file  . Intimate partner violence    Fear of current or ex partner: Not on file    Emotionally abused: Not on file    Physically abused: Not on file    Forced sexual activity: Not on file  Other Topics Concern  . Not on file  Social History Narrative   Patient is married ([name]) and lives at home with her husband.   Patient is retired.   Patient has two children.   Patient has a high school education.   Patient is right-handed.   Patient drinks 2 big mugs of coffee daily and sometimes tea in the evenings.    Past Surgical Hx:  Past Surgical History:  Procedure Laterality Date  . broken ankle Right   . CEREBRAL ANEURYSM REPAIR    . TONSILLECTOMY      Past Medical Hx:  Past Medical History:  Diagnosis Date  . BRCA2 positive 2017  . Diverticulosis   . Hearing loss   . HTN (hypertension)   . Memory loss   . 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 Seizures (HCC)   . Stroke, hemorrhagic (HCC) aneurysm bleed.   . Vestibulitis of ear     Family Hx:  Family History  Problem Relation Age of Onset  . Dementia Mother   . Stroke Mother   . Thyroid disease Mother   .  Osteoporosis Mother   . Ovarian cancer Paternal Grandmother 62  . Ovarian cancer Paternal Aunt 70  . Breast cancer Daughter 38       breast ca x2, bilateral dx. 38, 46--estrogen  . Other Daughter        no genetic testing as of 07/2015; has had ovaries removed  . Other Daughter        BRCA 2 Pos.  . Colon cancer Maternal Aunt        dx. unspecified age  .   Emphysema Maternal Uncle   . High blood pressure Maternal Grandmother   . Dementia Maternal Grandfather   . Bleeding Disorder Maternal Grandfather   . Emphysema Maternal Aunt   . Emphysema Maternal Aunt     Review of Systems:  Constitutional  Feels well Skin No rash, sores, jaundice, itching, dryness,  Cardiovascular  No chest pain, + occasional shortness of breath, palpitations and edema Pulmonary  No cough or wheeze.  Gastro Intestinal  No nausea, vomitting, or diarrhoea. No bright red blood per rectum, no abdominal pain, change in bowel movement, or constipation.  Genito Urinary  No frequency, urgency, dysuria,  Musculo Skeletal  No myalgia, arthralgia, joint swelling or pain  Neurologic  No weakness, numbness, change in gait,  Psychology  No depression, anxiety, insomnia.   Vitals:  Last menstrual period 01/14/1995.  Physical Exam: Blood pressure (!) 155/73, pulse (!) 54, temperature 98 F (36.7 C), temperature source Tympanic, resp. rate (!) 22, height 5' 4" (1.626 m), weight 245 lb (111.1 kg), last menstrual period 01/14/1995. Body mass index is 42.05 kg/m. Neck  Supple without any enlargements.  Lymph node survey. No cervical supraclavicular cervical or inguinal adenopathy Cardiovascular  Pulse normal rate, regularity and rhythm. S1 and S2 normal. Lungs  Decreased breath sounds at lung bases, some expiratory wheezes. Skin  No rash/lesions/breakdown  Psychiatry  Alert and oriented to person, place, and time  Back No CVA tenderness Abdomen  Normoactive bowel sounds, abdomen soft, non-tender and  obese.  Genito Urinary  Vulva/vagina: Normal external female genitalia.  No lesions.   Bladder/urethra:  No lesions or masses  Vagina: Moderately atrophic mucosa, 2 areas along the vaginal vault likely the source of spotting after the ultrasound secondary to atrophic tissue, cervix normal appearing, on bimanual  uterus small and mobile, no adnexal masses appreciated, no nodularity Extremities  No bilateral cyanosis, clubbing; trace edema.   Imaging: Pelvic ultrasound 10/26/18 FINDINGS: Uterus Measurements: 6.4 x 2.2 x 3.4 cm = volume: 25.4 mL. 1.2 x 0.7 x 0.9 cm stable calcified fibroid again noted. Minimal 4 mm fluid collection noted over the lower uterine segment. Follow-up examination in 6-8 weeks suggested. Endometrium Thickness: 4.2 mm.  No focal abnormality visualized. Right ovary Measurements: Not visualized Left ovary Measurements: Not visualized No abnormal free fluid. IMPRESSION: 1.  Stable small uterine fibroid. 2. Minimal 4 mm fluid collection noted the lower uterine segment. Follow-up examination in 6-8 weeks suggested. If this persists MRI of the pelvis can be obtained for further evaluation. 3.  Ovaries again not visualized.  

## 2018-10-25 ENCOUNTER — Other Ambulatory Visit: Payer: Self-pay | Admitting: *Deleted

## 2018-10-25 ENCOUNTER — Telehealth: Payer: Self-pay | Admitting: *Deleted

## 2018-10-25 NOTE — Telephone Encounter (Signed)
This RN spoke with pt per her call stating she has " gotten behind on my appointments due to Covid and want to know what to do ?"  Beth [name] states she usually sees her Gyn and then 6 months later Dr Jana Hakim.  She has a mammogram and then 6 months later a Breast MRI.  She states she is just now seeing her Gyn.  She is scheduled to see Dr Jana Hakim in November and she has not had either the [facility] or MRI.  Beth [name] states she is not having any noted breast issues or concerns at present " except that I am behind on everything "  This RN discussed best for her to proceed with Gyn exam and schedule her mammo (last was May 2019).  Appointment for Dr Jannifer Rodney will be rescheduled to May 2021 and MRI can be done before that appointment.  If any issues or concerns occur prior to rescheduled appointment she can call and be seen earlier.  Beth [name] verbalized understanding of plan.  This RN will send scheduling a request for change in appointments as well this RN will [facility] to for mammogram to assist patient.

## 2018-10-26 ENCOUNTER — Inpatient Hospital Stay: Payer: Medicare Other | Attending: Oncology

## 2018-10-26 ENCOUNTER — Encounter: Payer: Self-pay | Admitting: Gynecologic Oncology

## 2018-10-26 ENCOUNTER — Telehealth: Payer: Self-pay | Admitting: Oncology

## 2018-10-26 ENCOUNTER — Other Ambulatory Visit: Payer: Self-pay

## 2018-10-26 ENCOUNTER — Ambulatory Visit (HOSPITAL_COMMUNITY)
Admission: RE | Admit: 2018-10-26 | Discharge: 2018-10-26 | Disposition: A | Discharge status: Home / Self Care | Payer: Medicare Other | Source: Ambulatory Visit | Attending: Obstetrics | Admitting: Obstetrics

## 2018-10-26 ENCOUNTER — Inpatient Hospital Stay (HOSPITAL_BASED_OUTPATIENT_CLINIC_OR_DEPARTMENT_OTHER): Payer: Medicare Other | Admitting: Gynecologic Oncology

## 2018-10-26 VITALS — BP 155/73 | HR 54 | Temp 98.0°F | Resp 22 | Ht 64.0 in | Wt 245.0 lb

## 2018-10-26 DIAGNOSIS — I10 Essential (primary) hypertension: Secondary | ICD-10-CM | POA: Insufficient documentation

## 2018-10-26 DIAGNOSIS — Z803 Family history of malignant neoplasm of breast: Secondary | ICD-10-CM

## 2018-10-26 DIAGNOSIS — R978 Other abnormal tumor markers: Secondary | ICD-10-CM

## 2018-10-26 DIAGNOSIS — R1909 Other intra-abdominal and pelvic swelling, mass and lump: Secondary | ICD-10-CM

## 2018-10-26 DIAGNOSIS — N816 Rectocele: Secondary | ICD-10-CM | POA: Insufficient documentation

## 2018-10-26 DIAGNOSIS — J449 Chronic obstructive pulmonary disease, unspecified: Secondary | ICD-10-CM | POA: Diagnosis not present

## 2018-10-26 DIAGNOSIS — K5790 Diverticulosis of intestine, part unspecified, without perforation or abscess without bleeding: Secondary | ICD-10-CM | POA: Insufficient documentation

## 2018-10-26 DIAGNOSIS — R102 Pelvic and perineal pain: Secondary | ICD-10-CM | POA: Diagnosis not present

## 2018-10-26 DIAGNOSIS — D259 Leiomyoma of uterus, unspecified: Secondary | ICD-10-CM | POA: Diagnosis not present

## 2018-10-26 DIAGNOSIS — E669 Obesity, unspecified: Secondary | ICD-10-CM | POA: Diagnosis not present

## 2018-10-26 DIAGNOSIS — Z1501 Genetic susceptibility to malignant neoplasm of breast: Secondary | ICD-10-CM | POA: Insufficient documentation

## 2018-10-26 DIAGNOSIS — Z9981 Dependence on supplemental oxygen: Secondary | ICD-10-CM | POA: Insufficient documentation

## 2018-10-26 DIAGNOSIS — Z6841 Body Mass Index (BMI) 40.0 and over, adult: Secondary | ICD-10-CM | POA: Diagnosis not present

## 2018-10-26 DIAGNOSIS — Z8041 Family history of malignant neoplasm of ovary: Secondary | ICD-10-CM | POA: Insufficient documentation

## 2018-10-26 DIAGNOSIS — F418 Other specified anxiety disorders: Secondary | ICD-10-CM | POA: Diagnosis not present

## 2018-10-26 DIAGNOSIS — N838 Other noninflammatory disorders of ovary, fallopian tube and broad ligament: Secondary | ICD-10-CM

## 2018-10-26 DIAGNOSIS — Z148 Genetic carrier of other disease: Secondary | ICD-10-CM | POA: Diagnosis not present

## 2018-10-26 DIAGNOSIS — Z1509 Genetic susceptibility to other malignant neoplasm: Secondary | ICD-10-CM

## 2018-10-26 DIAGNOSIS — Z9189 Other specified personal risk factors, not elsewhere classified: Secondary | ICD-10-CM

## 2018-10-26 NOTE — Telephone Encounter (Signed)
Called pt per 10/12 sch message - unable to reach pt . Left message with appt date and time

## 2018-10-26 NOTE — Patient Instructions (Addendum)
You saw Dr. Jeral Pinch today for your visit. We will continue to follow you with CA-125, pelvic ultrasound and exam every 6 months. Please call the office at [phone] in January 2021 to schedule an appointment for April 2021 and we will schedule your ultrasound as well.  For fiber intake, try Fiber One bars.  Natural oils to try: olive, apricot, coconut  Please call the clinic at [phone] if you develop any symptoms such as abdominal pain, bloating, vaginal bleeding prior to your next visit.

## 2018-10-27 ENCOUNTER — Telehealth: Payer: Self-pay

## 2018-10-27 ENCOUNTER — Encounter: Payer: Self-pay | Admitting: Gynecologic Oncology

## 2018-10-27 LAB — CA 125: Cancer Antigen (CA) 125: 12.1 U/mL (ref 0.0–38.1)

## 2018-10-27 NOTE — Telephone Encounter (Signed)
Told Ms Sutter that the CA-125 was WNL at 12.1. Pt verbalized understanding.

## 2018-10-28 ENCOUNTER — Other Ambulatory Visit: Payer: Self-pay | Admitting: Neurology

## 2018-10-28 DIAGNOSIS — I609 Nontraumatic subarachnoid hemorrhage, unspecified: Secondary | ICD-10-CM

## 2018-10-31 NOTE — Progress Notes (Signed)
Cardiology Office Note   Date:  11/01/2018   ID:  Beth [name], Beth [name] November 03, 1940, MRN [id number]  PCP:  Kathyrn Lass, MD    No chief complaint on file.  palpitations  Wt Readings from Last 3 Encounters:  11/01/18 239 lb 12.8 oz (108.8 kg)  10/26/18 245 lb (111.1 kg)  02/24/18 239 lb (108.4 kg)       History of Present Illness: Beth [name] is a 78 y.o. female  Who I saw in  2009  After stroke. 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  Subsequent seizures. Has some word finding issues and cognitive deficits with emotional lability She takes Keppra and Klonopin and is unable to drive, according to prior records.  In 2017, she was seen by Dr. Johnsie Cancel for palpitations.  Echo at that time: "Left ventricle: The cavity size was normal. Wall thickness was   normal. Systolic function was vigorous. The estimated ejection   fraction was in the range of 65% to 70%. Wall motion was normal;   there were no regional wall motion abnormalities. Doppler   parameters are consistent with abnormal left ventricular   relaxation (grade 1 diastolic dysfunction). Doppler parameters   are consistent with high ventricular filling pressure. - Mitral valve: Calcified annulus.  Impressions:  - Vigorous LV systolic function; grade 1 diastolic dysfunction with   elevated LV filling pressure; trace MR and TR."   Since the last visit, she has had some SHOB.  Worse with exertion.  SHe feels weak.  She is on chronic oxygen for COPD.  She has an occasional fluttering in her heart.  She will eat a banana or drink water and sx improve.   End of September, there were several more intense spells of palpitations.  Two spells caused severe weakness and lightheadedness felt like she might pass out.  Weakness lasted for several days.    Denies : Chest pain.  Leg edema. Nitroglycerin use. Orthopnea.  Paroxysmal nocturnal dyspnea.  Syncope.      Past Medical History:   Diagnosis Date  . BRCA2 positive 2017  . Diverticulosis   . Hearing loss   . HTN (hypertension)   . Memory loss   . Seizure disorder, complex partial, 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[place]) seizure free for one year  . Seizures ([place])   . Stroke, hemorrhagic ([place]) aneurysm bleed.   . Vestibulitis of ear     Past Surgical History:  Procedure Laterality Date  . broken ankle Right   . CEREBRAL ANEURYSM REPAIR    . TONSILLECTOMY       Current Outpatient Medications  Medication Sig Dispense Refill  . albuterol (PROVENTIL HFA;VENTOLIN HFA) 108 (90 Base) MCG/ACT inhaler Inhale 2 puffs into the lungs every 4 (four) hours as needed for wheezing or shortness of breath. 1 Inhaler 5  . aspirin EC 81 MG tablet Take 81 mg by mouth daily.    Marland Kitchen atenolol (TENORMIN) 50 MG tablet TAKE 1/2 TABLET BY MOUTH TWICE A DAY    . captopril (CAPOTEN) 50 MG tablet Take 50 mg by mouth 2 (two) times daily.    . Cholecalciferol (VITAMIN D3) 2000 units capsule 1 tablet by mouth daily    . clonazePAM (KLONOPIN) 2 MG tablet Take 1 tablet (2 mg total) by mouth at bedtime. 90 tablet 1  . fexofenadine (ALLEGRA) 180 MG tablet Take 180 mg by mouth daily as needed.     . fluticasone (FLONASE) 50 MCG/ACT nasal  spray Place 2 sprays into the nose daily as needed for allergies. Spray twice daily as needed    . guaiFENesin (MUCINEX) 600 MG 12 hr tablet Take 1,200 mg by mouth 2 (two) times daily as needed for cough.     Marland Kitchen KEPPRA 750 MG tablet TAKE 1 TABLET TWICE A DAY 180 tablet 0  . LEXAPRO 20 MG tablet Take 1 tablet (20 mg total) by mouth daily. Morning dosage 90 tablet 3  . meloxicam (MOBIC) 15 MG tablet 1 tablet as needed    . simvastatin (ZOCOR) 10 MG tablet Take 10 mg by mouth at bedtime.    Marland Kitchen umeclidinium-vilanterol (ANORO ELLIPTA) 62.5-25 MCG/INH AEPB Inhale 1 puff into the lungs daily. 180 each 2   No current facility-administered medications for this visit.     Allergies:   Prozac [fluoxetine hcl], Sulfa antibiotics,  Micardis [telmisartan], Prednisone, Lisinopril-hydrochlorothiazide, Augmentin [amoxicillin-pot clavulanate], Avelox [moxifloxacin hcl in nacl], and Norvasc [amlodipine besylate]    Social History:  The patient  reports that she quit smoking about 12 years ago. She has a 41.00 pack-year smoking history. She has never used smokeless tobacco. She reports that she does not drink alcohol or use drugs.   Family History:  The patient's family history includes Bleeding Disorder in her maternal grandfather; Breast cancer (age of onset: 70) in her daughter; Colon cancer in her maternal aunt; Dementia in her maternal grandfather and mother; Emphysema in her maternal aunt, maternal aunt, and maternal uncle; High blood pressure in her maternal grandmother; Osteoporosis in her mother; Other in her daughter and daughter; Ovarian cancer (age of onset: 75) in her paternal grandmother; Ovarian cancer (age of onset: 61) in her paternal aunt; Stroke in her mother; Thyroid disease in her mother.    ROS:  Please see the history of present illness.   Otherwise, review of systems are positive for palpitations; pain .   All other systems are reviewed and negative.    PHYSICAL EXAM: VS:  BP 118/76   Pulse (!) 58   Ht '5\' 4"'  (1.626 m)   Wt 239 lb 12.8 oz (108.8 kg)   LMP 01/14/1995 (Approximate)   SpO2 98%   BMI 41.16 kg/m  , BMI Body mass index is 41.16 kg/m. GEN: Well nourished, well developed, in no acute distress  HEENT: normal  Neck: no JVD, carotid bruits, or masses Cardiac: RRR; no murmurs, rubs, or gallops,no edema  Respiratory:  clear to auscultation bilaterally, normal work of breathing GI: soft, nontender, nondistended, + BS MS: no deformity or atrophy  Skin: warm and dry, no rash Neuro:  Strength and sensation are intact Psych: euthymic mood, full affect   EKG:   The ekg ordered today demonstrates SB, nonspecific ST changes   Recent Labs: 02/22/2018: ALT 16; BUN 9; Creatinine 0.85; Hemoglobin  13.5; Platelet Count 233; Potassium 4.0; Sodium 138   Lipid Panel    Component Value Date/Time   CHOL  04/16/2007 0545    146        ATP III CLASSIFICATION:  <200     mg/dL   Desirable  200-239  mg/dL   Borderline High  >=240    mg/dL   High   TRIG 75 04/16/2007 0545   HDL 47 04/16/2007 0545   CHOLHDL 3.1 04/16/2007 0545   VLDL 15 04/16/2007 0545   LDLCALC  04/16/2007 0545    84        Total Cholesterol/HDL:CHD Risk Coronary Heart Disease Risk Table  Men   Women  1/2 Average Risk   3.4   3.3     Other studies Reviewed: Additional studies/ records that were reviewed today with results demonstrating: normal stress test in 2/18.  Normal LV function in 2017.   ASSESSMENT AND PLAN:  1. Palpitations: No episodes in the past few weks.  If sx recur, could plan 30 day event monitor.  2. DOE: Check echo.  3. Seizures: Well controlled.  4. HTN: The current medical regimen is effective;  continue present plan and medications. 5. Edema: Resolved.  Has some focal , symmetric swelling behind both knees.  COuld be Bakers cysts.  SHe will f/u with PMD.   Current medicines are reviewed at length with the patient today.  The patient concerns regarding her medicines were addressed.  The following changes have been made:  No change  Labs/ tests ordered today include:  No orders of the defined types were placed in this encounter.   Recommend 150 minutes/week of aerobic exercise Low fat, low carb, high fiber diet recommended  Disposition:   FU as needed   Signed, Larae Grooms, MD  11/01/2018 12:15 PM    [facility] [address], [place], [place]  [address] Phone: [phone]; Fax: [phone]

## 2018-11-01 ENCOUNTER — Ambulatory Visit (INDEPENDENT_AMBULATORY_CARE_PROVIDER_SITE_OTHER): Payer: Medicare Other | Admitting: Interventional Cardiology

## 2018-11-01 ENCOUNTER — Other Ambulatory Visit: Payer: Self-pay

## 2018-11-01 ENCOUNTER — Encounter: Payer: Self-pay | Admitting: Interventional Cardiology

## 2018-11-01 VITALS — BP 118/76 | HR 58 | Ht 64.0 in | Wt 239.8 lb

## 2018-11-01 DIAGNOSIS — I10 Essential (primary) hypertension: Secondary | ICD-10-CM

## 2018-11-01 DIAGNOSIS — R002 Palpitations: Secondary | ICD-10-CM | POA: Diagnosis not present

## 2018-11-01 DIAGNOSIS — R0602 Shortness of breath: Secondary | ICD-10-CM | POA: Diagnosis not present

## 2018-11-01 DIAGNOSIS — R600 Localized edema: Secondary | ICD-10-CM | POA: Diagnosis not present

## 2018-11-01 NOTE — Patient Instructions (Signed)
Medication Instructions:  Your physician recommends that you continue on your current medications as directed. Please refer to the Current Medication list given to you today.  If you need a refill on your cardiac medications before your next appointment, please call your pharmacy.   Lab work: None Ordered  If you have labs (blood work) drawn today and your tests are completely normal, you will receive your results only by: Marland Kitchen MyChart Message (if you have MyChart) OR . A paper copy in the mail If you have any lab test that is abnormal or we need to change your treatment, we will call you to review the results.  Testing/Procedures: Your physician has requested that you have an echocardiogram. Echocardiography is a painless test that uses sound waves to create images of your heart. It provides your doctor with information about the size and shape of your heart and how well your heart's chambers and valves are working. This procedure takes approximately one hour. There are no restrictions for this procedure.  Follow-Up: . AS NEEDED  Any Other Special Instructions Will Be Listed Below (If Applicable).

## 2018-11-04 ENCOUNTER — Other Ambulatory Visit: Payer: Self-pay

## 2018-11-04 ENCOUNTER — Ambulatory Visit (HOSPITAL_COMMUNITY): Payer: Medicare Other | Attending: Cardiology

## 2018-11-04 DIAGNOSIS — R0602 Shortness of breath: Secondary | ICD-10-CM | POA: Insufficient documentation

## 2018-11-29 ENCOUNTER — Ambulatory Visit: Payer: Medicare Other | Admitting: Oncology

## 2018-12-01 DIAGNOSIS — Z1159 Encounter for screening for other viral diseases: Secondary | ICD-10-CM | POA: Diagnosis not present

## 2018-12-01 DIAGNOSIS — Z0000 Encounter for general adult medical examination without abnormal findings: Secondary | ICD-10-CM | POA: Diagnosis not present

## 2018-12-02 ENCOUNTER — Ambulatory Visit: Payer: Medicare Other | Admitting: Neurology

## 2018-12-07 ENCOUNTER — Telehealth: Payer: Self-pay | Admitting: Neurology

## 2018-12-07 ENCOUNTER — Other Ambulatory Visit: Payer: Self-pay | Admitting: Neurology

## 2018-12-07 DIAGNOSIS — G40409 Other generalized epilepsy and epileptic syndromes, not intractable, without status epilepticus: Secondary | ICD-10-CM

## 2018-12-07 DIAGNOSIS — I609 Nontraumatic subarachnoid hemorrhage, unspecified: Secondary | ICD-10-CM

## 2018-12-07 DIAGNOSIS — F09 Unspecified mental disorder due to known physiological condition: Secondary | ICD-10-CM

## 2018-12-07 MED ORDER — CLONAZEPAM 2 MG PO TABS: 2.0000 mg | ORAL_TABLET | Freq: Every day | ORAL | 0 refills | Status: DC

## 2018-12-07 NOTE — Telephone Encounter (Signed)
I have routed to Dr Leta Baptist to review in Dr Dohmeier's absence.

## 2018-12-07 NOTE — Telephone Encounter (Signed)
Pt is needing a refill on her clonazePAM (KLONOPIN) 2 MG tablet sent to [facility]  Pt has enough for this week.

## 2018-12-08 ENCOUNTER — Ambulatory Visit: Payer: Medicare Other | Admitting: Neurology

## 2019-01-12 ENCOUNTER — Other Ambulatory Visit: Payer: Self-pay | Admitting: Neurology

## 2019-01-12 DIAGNOSIS — I609 Nontraumatic subarachnoid hemorrhage, unspecified: Secondary | ICD-10-CM

## 2019-01-13 DIAGNOSIS — I10 Essential (primary) hypertension: Secondary | ICD-10-CM | POA: Diagnosis not present

## 2019-01-13 DIAGNOSIS — F3341 Major depressive disorder, recurrent, in partial remission: Secondary | ICD-10-CM | POA: Diagnosis not present

## 2019-01-13 DIAGNOSIS — E119 Type 2 diabetes mellitus without complications: Secondary | ICD-10-CM | POA: Diagnosis not present

## 2019-01-13 DIAGNOSIS — E7849 Other hyperlipidemia: Secondary | ICD-10-CM | POA: Diagnosis not present

## 2019-01-13 DIAGNOSIS — J449 Chronic obstructive pulmonary disease, unspecified: Secondary | ICD-10-CM | POA: Diagnosis not present

## 2019-01-26 ENCOUNTER — Telehealth: Payer: Self-pay | Admitting: Neurology

## 2019-01-26 ENCOUNTER — Other Ambulatory Visit: Payer: Self-pay | Admitting: Neurology

## 2019-01-26 DIAGNOSIS — I609 Nontraumatic subarachnoid hemorrhage, unspecified: Secondary | ICD-10-CM

## 2019-01-26 MED ORDER — KEPPRA 750 MG PO TABS: ORAL_TABLET | ORAL | 0 refills | Status: DC

## 2019-01-26 NOTE — Telephone Encounter (Signed)
Pt's husband called wanting to speak to RN about the pt's KEPPRA 750 MG tablet Husband Beth [name] on [employer] states that [facility] is informing them that it is not valid with them. Please advise.

## 2019-01-26 NOTE — Telephone Encounter (Signed)
Called the husband and he states that [facility] is waiting on a script. On our end we forwarded the refill request on 01/12/19 and received a confirmation through escribing. Advised I will attempt and resend for the patient and for him to check back with [facility] in a little bit. Pt's husband verbalized understanding.

## 2019-01-27 ENCOUNTER — Other Ambulatory Visit: Payer: Self-pay | Admitting: Neurology

## 2019-01-27 DIAGNOSIS — I609 Nontraumatic subarachnoid hemorrhage, unspecified: Secondary | ICD-10-CM

## 2019-01-27 MED ORDER — LEVETIRACETAM 750 MG PO TB3D: 750.0000 mg | ORAL_TABLET | Freq: Two times a day (BID) | ORAL | 0 refills | Status: DC

## 2019-02-07 ENCOUNTER — Encounter: Payer: Self-pay | Admitting: Neurology

## 2019-02-07 ENCOUNTER — Ambulatory Visit (INDEPENDENT_AMBULATORY_CARE_PROVIDER_SITE_OTHER): Payer: Medicare Other | Admitting: Neurology

## 2019-02-07 ENCOUNTER — Other Ambulatory Visit: Payer: Self-pay

## 2019-02-07 VITALS — BP 138/84 | HR 72 | Temp 96.3°F | Ht 64.0 in | Wt 239.0 lb

## 2019-02-07 DIAGNOSIS — F09 Unspecified mental disorder due to known physiological condition: Secondary | ICD-10-CM | POA: Diagnosis not present

## 2019-02-07 DIAGNOSIS — G40409 Other generalized epilepsy and epileptic syndromes, not intractable, without status epilepticus: Secondary | ICD-10-CM | POA: Diagnosis not present

## 2019-02-07 DIAGNOSIS — J449 Chronic obstructive pulmonary disease, unspecified: Secondary | ICD-10-CM

## 2019-02-07 DIAGNOSIS — G3184 Mild cognitive impairment, so stated: Secondary | ICD-10-CM | POA: Diagnosis not present

## 2019-02-07 DIAGNOSIS — J9611 Chronic respiratory failure with hypoxia: Secondary | ICD-10-CM | POA: Diagnosis not present

## 2019-02-07 DIAGNOSIS — I609 Nontraumatic subarachnoid hemorrhage, unspecified: Secondary | ICD-10-CM

## 2019-02-07 MED ORDER — LEVETIRACETAM 750 MG PO TB3D: 750.0000 mg | ORAL_TABLET | Freq: Two times a day (BID) | ORAL | 0 refills | Status: DC

## 2019-02-07 NOTE — Patient Instructions (Signed)

## 2019-02-07 NOTE — Progress Notes (Signed)
PATIENT: Beth [name] DOB: 1940/08/21  REASON FOR VISIT: follow up- seizures HISTORY FROM: patient alone, she left husband in the waiting room. She is on a 02 concentrator.   HISTORY OF PRESENT ILLNESS:   02-07-2019: Rv for Beth [name], a long-time patient of our [place] practice. Beth [name] is a meanwhile 79 year old Caucasian right-handed female patient who is seen here after a 29-monthhiatus.  She has felt very isolated during the Covid pandemic, just got her first shot of the vaccine.  [name] has further declined over the last year and it has led to some sometimes 10 situations.  She has had a stable MoCA test and she is feeling that she is able to take very well care of herself.  She has not had recent seizure activity and not since her last visit with [place]  She just gotten a shipment of generic Keppra since the insurance is not covering the brand name anymore.   She is due to her social isolation somewhat frustrated and she endorsed the geriatric depression scale accordingly at 6 out of 15 points.  This seems to be situational and circumstantial..   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 In 2010 she injured her foot severely during a seizure and many surgeries to the ankle.  She became oxygen dependent in 2018 , has COPD. A cognitive decline has been evidient [facility]. She has difficulties to structure her thoughts, is logorrhoeic and yet tangential, but this has improved with oxygen ! She is less fatigued. More alert. She reports today is very good day, and she also reports having very bad ones. Some days she is too tired , too fatigued to move.  Beth [name] had no interval seizure activity, this makes her seizure-free for 12 months.    She continues to take a baby  aspirin, albuterol inhaler, atenolol tablets 25 mg her daily dose, captopril 2 times a day, vitamin D,  Lexapro, Allegra and Klonopin 2 mg at bedtime.  She also has Flonase nasal spray, Mucinex as needed for cough, she takes Keppra 750 mg twice a day, she also is on tiotropium bromide, on Maxide and on ellipta ANORO.   The bedtime medication was needed for nocturnal agitation, and seizure and insomnia. We are discussing her last MRI brain 04-2017, with mild microvascular changes, [facility]     Montreal Cognitive Assessment  02/07/2019 10/14/2017 04/08/2016 10/10/2015 10/11/2014  Visuospatial/ Executive (0/5) _0 Naming (0/3) _1 Attention: Read list of digits (0/2) _2 Attention: Read list of letters (0/1) 1 0 _3 Attention: Serial 7 subtraction starting at 100 (0/3) _4 Language: Repeat phrase (0/2) _5 Language : Fluency (0/1) 1 1 0 1 0  Abstraction (0/2) _6 Delayed Recall (0/5) _7 Orientation (0/6) _8 Total _9 Adjusted Score (based on education) - 27 26 - 27           04-13-2017, Patient is here for seizure follow up, concentration and memory problems.  Beth [name] endorsed the  geriatric depression score at 5 out of 15 points, which is a borderline result.  She states she had no seizure activity over the last 6-8 months, she is now using oxygen 24/7 portable tank is with her today. COPD  Beth [name] reports that some of her routines and rituals have changed-certainly over the last year- again she feels that she is less organized, she has often skipped certain activities that she used to do in order. At  one time she accused her husband of getting lost while he was driving, because she could not recognize the surroundings. Not cooking or backing any longer. 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      History : MM Beth [name] is a 79 year old  female with a history of seizures. She returns today for follow-up. She continues to take Keppra 750 mg twice a day. She denies any seizure events. She is able to complete all ADLs independently. She is currently not operating a motor vehicle. In the past the patient has reported repeatedly trouble with her memory. At the last visit her memory score has remained stable. Patient reports that her memory continued to remained stable. She states that she is forgetful at times reports having a hard time recalling certain things such as a restaurant name. She states that she has been diagnosed with COPD. During this flare up she felt that she had an abnormal heart rhythm and was concerned about a potential stroke. However she reports she did not have any strokelike symptoms. She is currently on medication and inhalers to treat her COPD that she feels is working well for her. She returns today for an evaluation.   HISTORY 04/10/15: MM- Beth [name] is a 79 year old female with a history of seizures. She returns today for follow-up. She is currently taking Keppra 750 mg twice a day. She reports that she is tolerating this medication well. She denies any new neurological symptoms. She states at home she is able to complete all ADLs independently. She stopped driving after her stroke. 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 She states that at the last visit Dr. Brett Fairy ordered Belsomra and gave her samples however she did not start this medication. She states that she has continue using the Klonopin. She reports that she has had some issues breathing and was given albuterol. She states that she is still having some difficulty breathing and her primary care mentioned a sleep study. She denies snoring. She denies any excessive daytime sleepiness. Denies a morning headache. She states that her husband has not witnessed any apnea events. The patient feels that her memory has remained stable. She just  returned from traveling to see family members. She does report some numbness in the fingertips on the right hand. Occasionally will drop things in the right hand. However this time she does not want to proceed with any additional testing. She returns today for an evaluation.  HISTORY 10/11/14 [facility]): Patient returned for a yearly follow up. She reports medical problems: a carbuncle with MRSA, diagnosed spring 2016 and treated with ATB( doxicycline) by Dr. Amy Martinique , her Dermatologist . The patient tolerated the oral doxycycline fine. Beth [name]'s cognitive status remains stable we performed a [place] cognitive assessment test today and she scored well on the clock drawing . She has a mild tremor which impairs the contour of the clock face, she scored well on this Trail Making Test name  3 animals had some trouble again this tremor on drawing a cube. Was able to do the attention tests but not subtraction. Language and abstraction were tested the patient only had a word fluency test of 9 points. She was able to score 4 out of 5 recall words and was fully oriented to date place and time. She scored 26 out of 30 points, is a HS graduate. 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 No seizures in the interval time.   Beth [name] is a 79 y.o. female here seen originally many years ago as a referral from Dr.Kevin Little ,  Mrs. Rufer is an established patient of my practice. 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  She also has a history of hypertension, osteopenia, vertigo and some mild hearing loss, classified as vestibulitis in her older records. Surgical history is positive for right foot fracture in May 2010 and 4 surgeries to the ankle. Her ankle was fractured in a seizure related incident 2010,  twisting her foot in the midst of her typical left twisting body  movements during a seizure.  [name] brain injury in 2008 has affected her cognitively and she often struggles to find words. 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 She is taking 20 mg daily. She also takes vitamin D against fatigue. Her affect is better controlled and she feels more assured, and now is less socially isolated.  Mrs. Bloor had been seizure-free for over 12 months by the time of her April 2014 visit, now she reports that she had one seizure around Christmas 2014, while compliant on her current regimen of Keppra and Clonazepam.  She fills her medications at [facility] on [address] in [place].Vestibular evaluation for severe vertigo   REVIEW OF SYSTEMS: Out of a complete 14 system review of symptoms, the patient complains only of the following symptoms, and all other reviewed systems are negative.  MCI - since stroke, small vessel; diease. Seizures.   ALLERGIES: Allergies  Allergen Reactions  . Prozac [Fluoxetine Hcl] Other (See Comments)    seizures  . Sulfa Antibiotics Nausea And Vomiting and Other (See Comments)    TINGLING IN LEGS ALSO  . Micardis [Telmisartan] Swelling and Other (See Comments)    Swelling, bruising Swelling, bruising  . Prednisone Other (See Comments)    mood changes Mood changes  . Lisinopril-Hydrochlorothiazide Other (See Comments)    unknown  . Augmentin [Amoxicillin-Pot Clavulanate] Rash  . Avelox [Moxifloxacin Hcl In Nacl] Rash    LEVAQUIN IS OK-SEE NOTE  . Norvasc [Amlodipine Besylate] Other (See Comments)    fatigue    HOME MEDICATIONS: Outpatient Medications Prior to Visit  Medication Sig Dispense Refill  . albuterol (PROVENTIL HFA;VENTOLIN HFA) 108 (90 Base) MCG/ACT inhaler Inhale 2 puffs into the lungs every 4 (four) hours as needed for wheezing or shortness of breath. 1 Inhaler 5  . aspirin EC 81 MG tablet Take 81 mg by mouth daily.    Marland Kitchen  atenolol (TENORMIN) 50 MG tablet TAKE 1/2 TABLET BY MOUTH TWICE A DAY    . captopril (CAPOTEN) 50 MG tablet Take 50 mg by mouth 2 (two) times daily.    . Cholecalciferol (VITAMIN D3) 2000 units capsule 1 tablet by mouth daily    . clonazePAM (KLONOPIN) 2 MG tablet Take 1 tablet (2 mg  total) by mouth at bedtime. 90 tablet 0  . fexofenadine (ALLEGRA) 180 MG tablet Take 180 mg by mouth daily as needed.     . fluticasone (FLONASE) 50 MCG/ACT nasal spray Place 2 sprays into the nose daily as needed for allergies. Spray twice daily as needed    . guaiFENesin (MUCINEX) 600 MG 12 hr tablet Take 1,200 mg by mouth 2 (two) times daily as needed for cough.     . levETIRAcetam 750 MG TB3D Take 750 mg by mouth 2 (two) times daily. 180 tablet 0  . LEXAPRO 20 MG tablet Take 1 tablet (20 mg total) by mouth daily. Morning dosage 90 tablet 3  . meloxicam (MOBIC) 15 MG tablet 1 tablet as needed    . simvastatin (ZOCOR) 10 MG tablet Take 10 mg by mouth at bedtime.    Marland Kitchen umeclidinium-vilanterol (ANORO ELLIPTA) 62.5-25 MCG/INH AEPB Inhale 1 puff into the lungs daily. 180 each 2   No facility-administered medications prior to visit.    PAST MEDICAL HISTORY: Past Medical History:  Diagnosis Date  . BRCA2 positive 2017  . Diverticulosis   . Hearing loss   . HTN (hypertension)   . Memory loss   . Seizure disorder, complex partial, 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[place]) seizure free for one year  . Seizures ([place])   . 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   . Vestibulitis of ear     PAST SURGICAL HISTORY: Past Surgical History:  Procedure Laterality Date  . broken ankle Right   . CEREBRAL ANEURYSM REPAIR    . TONSILLECTOMY      FAMILY HISTORY: Family History  Problem Relation Age of Onset  . Dementia Mother   . Stroke Mother   . Thyroid disease Mother   . Osteoporosis Mother   . Ovarian cancer Paternal Grandmother 32  . Ovarian cancer Paternal Aunt 73  . Breast cancer Daughter 17       breast ca x2,  bilateral dx. 38, 46--estrogen  . Other Daughter        no genetic testing as of 07/2015; has had ovaries removed  . Other Daughter        BRCA 2 Pos.  . Colon cancer Maternal Aunt        dx. unspecified age  . Emphysema Maternal Uncle   . High blood pressure Maternal Grandmother   . Dementia Maternal Grandfather   . Bleeding Disorder Maternal Grandfather   . Emphysema Maternal Aunt   . Emphysema Maternal Aunt     SOCIAL HISTORY: Social History   Socioeconomic History  . Marital status: Married    Spouse name: Jenny Reichmann  . Number of children: 2  . Years of education: 3  . Highest education level: Not on file  Occupational History  . Not on file  Tobacco Use  . Smoking status: Former Smoker    Packs/day: 1.00    Years: 41.00    Pack years: 41.00    Quit date: 01/13/2006    Years since quitting: 13.0  . Smokeless tobacco: Never Used  Substance and Sexual Activity  . Alcohol use: No    Alcohol/week: 0.0 standard drinks  . Drug use: No  . Sexual activity: Never    Partners: Male    Birth control/protection: Post-menopausal  Other Topics Concern  . Not on file  Social History Narrative   Patient is married ([name]) and lives at home with her husband.   Patient is retired.   Patient has two children.  Patient has a high school education.   Patient is right-handed.   Patient drinks 2 big mugs of coffee daily and sometimes tea in the evenings.   Social Determinants of Health   Financial Resource Strain:   . Difficulty of Paying Living Expenses: Not on file  Food Insecurity:   . Worried About Charity fundraiser in the Last Year: Not on file  . Ran Out of Food in the Last Year: Not on file  Transportation Needs:   . Lack of Transportation (Medical): Not on file  . Lack of Transportation (Non-Medical): Not on file  Physical Activity:   . Days of Exercise per Week: Not on file  . Minutes of Exercise per Session: Not on file  Stress:   . Feeling of Stress : Not on file    Social Connections:   . Frequency of Communication with Friends and Family: Not on file  . Frequency of Social Gatherings with Friends and Family: Not on file  . Attends Religious Services: Not on file  . Active Member of Clubs or Organizations: Not on file  . Attends Archivist Meetings: Not on file  . Marital Status: Not on file  Intimate Partner Violence:   . Fear of Current or Ex-Partner: Not on file  . Emotionally Abused: Not on file  . Physically Abused: Not on file  . Sexually Abused: Not on file      PHYSICAL EXAM  Vitals:   02/07/19 1335  BP: 138/84  Pulse: 72  Temp: (!) 96.3 F (35.7 C)  Weight: 239 lb (108.4 kg)  Height: 5' 4" (1.626 m)   Body mass index is 41.02 kg/m.  Result History    Montreal Cognitive Assessment  02/07/2019 10/14/2017 04/08/2016 10/10/2015 10/11/2014  Visuospatial/ Executive (0/5) _0 Naming (0/3) _1 Attention: Read list of digits (0/2) _2 Attention: Read list of letters (0/1) 1 0 _3 Attention: Serial 7 subtraction starting at 100 (0/3) _4 Language: Repeat phrase (0/2) _5 Language : Fluency (0/1) 1 1 0 1 0  Abstraction (0/2) _6 Delayed Recall (0/5) _7 Orientation (0/6) _8 Total _9 Adjusted Score (based on education) - 27 26 - 27     Generalized: Well developed, groomed, in no acute distress, on 02 for  24/7 now.    Neurological examination  Mentation: alert - today was able to state  time, place, history . She is a little anxious. She fears her husband develops dementia. She is tangential, and has been all over the place with her interval report, fluent speech, slightly dysphonic. MOCA.  Cranial nerve : loss of taste and smell- she craves sweets. Pupils were equal in size, both are round- and remained reactive to light.  Extraocular movements were full, visual field were full on confrontational test.  Facial sensation and strength were normal.  Uvula and tongue move in midline, no swelling, no tremor. Neck stiffness- restricted ROM-  Head turning and shoulder shrug  were impaired.   soft touch is felt on all 4 extremities. Coordination:  finger-nose-maneuver is without  Gait and station: Gait deferred today.  Reflexes: Deep tendon reflexes are symmetric bilaterally.   DIAGNOSTIC DATA (LABS, IMAGING, TESTING) - I reviewed patient records, labs, notes,  testing and imaging myself where available.  Lab Results  Component Value Date   WBC 9.4 02/22/2018   HGB 13.5 02/22/2018   HCT 40.3 02/22/2018   MCV 96.2 02/22/2018   PLT 233 02/22/2018      Component Value Date/Time   NA 138 02/22/2018 1051   K 4.0 02/22/2018 1051   CL 99 02/22/2018 1051   CO2 28 02/22/2018 1051   GLUCOSE 114 (H) 02/22/2018 1051   BUN 9 02/22/2018 1051   CREATININE 0.85 02/22/2018 1051   CALCIUM 9.5 02/22/2018 1051   PROT 7.7 02/22/2018 1051   ALBUMIN 3.8 02/22/2018 1051   AST 18 02/22/2018 1051   ALT 16 02/22/2018 1051   ALKPHOS 55 02/22/2018 1051   BILITOT 0.6 02/22/2018 1051   GFRNONAA >60 02/22/2018 1051   GFRAA >60 02/22/2018 1051    ASSESSMENT AND PLAN:  79 y.o. year old female  has a past medical history of BRCA2 positive (2017), Diverticulosis, Hearing loss, HTN (hypertension), Memory loss, Seizure disorder, complex partial, 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[place]) (seizure free for one year), Seizures ([place]), Stroke, hemorrhagic ([place]) (aneurysm bleed. ), and Vestibulitis of ear. here with:   1. Status post [place] after aneurysm bleed in 2008- developed Seizures, worst manifestations in 2010- now controlled on Keppra.  2. 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 3. Encephalopathy has improved with oxygen supplementation- more alert, more oriented. She is on 02 for 24/ 7.  4. Gait instability, beginning with ankle fracture in 2010 and now wide based. Walks still without a  cane. Insecure when climbing stairs. She is going out very little - due to COVID 19 . Husband is also gait impaired and has progressive memory loss. .   She'll continue on Keppra 750 mg twice a day. She is now using generic Keppra.  Patient's memory score has remained stable. We will continue to monitor her by [facility] tests q 6 month. If her husband is truly as cognitively impaired the couple may need a care taker to come in, likely the couple's daughter in [place]- for help with medication, financial affairs, taxes , etc.  No driving !   Follow-up in 6 months with NP, please , the next visit after that is again with me. MOCA , refills if needed.    Larey Seat, MD  02/07/2019, 1:51 PM [facility] [address], [place], [place] [address] [phone]

## 2019-04-05 ENCOUNTER — Other Ambulatory Visit: Payer: Self-pay | Admitting: Diagnostic Neuroimaging

## 2019-04-05 DIAGNOSIS — G40409 Other generalized epilepsy and epileptic syndromes, not intractable, without status epilepticus: Secondary | ICD-10-CM

## 2019-04-05 DIAGNOSIS — I609 Nontraumatic subarachnoid hemorrhage, unspecified: Secondary | ICD-10-CM

## 2019-04-05 DIAGNOSIS — F09 Unspecified mental disorder due to known physiological condition: Secondary | ICD-10-CM

## 2019-04-11 ENCOUNTER — Telehealth: Payer: Self-pay | Admitting: *Deleted

## 2019-04-11 NOTE — Telephone Encounter (Signed)
Called and spoke with the patient; explained that it is time for lab,US and MD visit. Patient will talk with her husband and call the office back.

## 2019-04-13 ENCOUNTER — Other Ambulatory Visit: Payer: Self-pay | Admitting: Family Medicine

## 2019-04-13 ENCOUNTER — Telehealth: Payer: Self-pay | Admitting: Oncology

## 2019-04-13 DIAGNOSIS — Z1231 Encounter for screening mammogram for malignant neoplasm of breast: Secondary | ICD-10-CM

## 2019-04-13 NOTE — Telephone Encounter (Signed)
Scheduled appt per 3/31 sch msg - pt aware of appt date and time

## 2019-04-14 ENCOUNTER — Telehealth: Payer: Self-pay | Admitting: *Deleted

## 2019-04-14 NOTE — Telephone Encounter (Signed)
Called and scheduled the patient for an [place] scan, lab and MD visit. Called the patient with the date and times of the appts; along with instructions

## 2019-04-18 ENCOUNTER — Ambulatory Visit
Admission: RE | Admit: 2019-04-18 | Discharge: 2019-04-18 | Disposition: A | Discharge status: Home / Self Care | Payer: Medicare Other | Source: Ambulatory Visit | Attending: Family Medicine | Admitting: Family Medicine

## 2019-04-18 ENCOUNTER — Other Ambulatory Visit: Payer: Self-pay

## 2019-04-18 DIAGNOSIS — Z1231 Encounter for screening mammogram for malignant neoplasm of breast: Secondary | ICD-10-CM | POA: Diagnosis not present

## 2019-04-20 DIAGNOSIS — J449 Chronic obstructive pulmonary disease, unspecified: Secondary | ICD-10-CM | POA: Diagnosis not present

## 2019-04-20 DIAGNOSIS — E119 Type 2 diabetes mellitus without complications: Secondary | ICD-10-CM | POA: Diagnosis not present

## 2019-04-20 DIAGNOSIS — F3341 Major depressive disorder, recurrent, in partial remission: Secondary | ICD-10-CM | POA: Diagnosis not present

## 2019-04-20 DIAGNOSIS — I10 Essential (primary) hypertension: Secondary | ICD-10-CM | POA: Diagnosis not present

## 2019-04-20 DIAGNOSIS — E7849 Other hyperlipidemia: Secondary | ICD-10-CM | POA: Diagnosis not present

## 2019-04-25 ENCOUNTER — Ambulatory Visit: Payer: Medicare Other | Admitting: Primary Care

## 2019-04-26 ENCOUNTER — Other Ambulatory Visit: Payer: Self-pay

## 2019-04-26 ENCOUNTER — Ambulatory Visit (HOSPITAL_COMMUNITY)
Admission: RE | Admit: 2019-04-26 | Discharge: 2019-04-26 | Disposition: A | Discharge status: Home / Self Care | Payer: Medicare Other | Source: Ambulatory Visit | Attending: Gynecologic Oncology | Admitting: Gynecologic Oncology

## 2019-04-26 DIAGNOSIS — Z1501 Genetic susceptibility to malignant neoplasm of breast: Secondary | ICD-10-CM | POA: Insufficient documentation

## 2019-04-26 DIAGNOSIS — N838 Other noninflammatory disorders of ovary, fallopian tube and broad ligament: Secondary | ICD-10-CM | POA: Insufficient documentation

## 2019-04-26 DIAGNOSIS — D259 Leiomyoma of uterus, unspecified: Secondary | ICD-10-CM | POA: Diagnosis not present

## 2019-04-26 DIAGNOSIS — N888 Other specified noninflammatory disorders of cervix uteri: Secondary | ICD-10-CM | POA: Diagnosis not present

## 2019-04-26 DIAGNOSIS — Z1509 Genetic susceptibility to other malignant neoplasm: Secondary | ICD-10-CM | POA: Diagnosis not present

## 2019-04-28 ENCOUNTER — Other Ambulatory Visit: Payer: Self-pay

## 2019-04-28 ENCOUNTER — Encounter: Payer: Self-pay | Admitting: Gynecologic Oncology

## 2019-04-28 ENCOUNTER — Inpatient Hospital Stay: Payer: Medicare Other | Attending: Gynecologic Oncology | Admitting: Gynecologic Oncology

## 2019-04-28 ENCOUNTER — Inpatient Hospital Stay: Payer: Medicare Other

## 2019-04-28 VITALS — BP 116/70 | HR 50 | Temp 98.7°F | Ht 64.0 in | Wt 243.1 lb

## 2019-04-28 DIAGNOSIS — R935 Abnormal findings on diagnostic imaging of other abdominal regions, including retroperitoneum: Secondary | ICD-10-CM

## 2019-04-28 DIAGNOSIS — Z803 Family history of malignant neoplasm of breast: Secondary | ICD-10-CM | POA: Diagnosis not present

## 2019-04-28 DIAGNOSIS — N858 Other specified noninflammatory disorders of uterus: Secondary | ICD-10-CM

## 2019-04-28 DIAGNOSIS — Z1502 Genetic susceptibility to malignant neoplasm of ovary: Secondary | ICD-10-CM | POA: Diagnosis not present

## 2019-04-28 DIAGNOSIS — R978 Other abnormal tumor markers: Secondary | ICD-10-CM

## 2019-04-28 DIAGNOSIS — D390 Neoplasm of uncertain behavior of uterus: Secondary | ICD-10-CM | POA: Insufficient documentation

## 2019-04-28 DIAGNOSIS — R1909 Other intra-abdominal and pelvic swelling, mass and lump: Secondary | ICD-10-CM

## 2019-04-28 DIAGNOSIS — Z87891 Personal history of nicotine dependence: Secondary | ICD-10-CM | POA: Insufficient documentation

## 2019-04-28 DIAGNOSIS — Z1509 Genetic susceptibility to other malignant neoplasm: Secondary | ICD-10-CM

## 2019-04-28 DIAGNOSIS — Z1273 Encounter for screening for malignant neoplasm of ovary: Secondary | ICD-10-CM | POA: Insufficient documentation

## 2019-04-28 DIAGNOSIS — Z1501 Genetic susceptibility to malignant neoplasm of breast: Secondary | ICD-10-CM | POA: Diagnosis not present

## 2019-04-28 DIAGNOSIS — Z8041 Family history of malignant neoplasm of ovary: Secondary | ICD-10-CM

## 2019-04-28 NOTE — Patient Instructions (Signed)
Your ultrasound and Ca1 25 were both reassuring!  Your exam looks good today.  We will plan to get an ultrasound and CA-125 in 6 months as we discussed today is your preference, and I will see you back in a year for an exam, ultrasound and repeat CA-125.

## 2019-04-28 NOTE — Progress Notes (Signed)
Gynecologic Oncology Return Clinic Visit  04/28/19  Reason for Visit: Surveillance in the setting of BRCA2 positive  Treatment History: Beth [name] was initially seen in consultationby Dr. Alycia [name] for BRCA 2 gene positivity at the request of Dr. Jana [name]. She opted for testing for BRCA mutation due to her strong family history of breast and ovarian cancer. Of note, her daughter was found to be positive after being diagnosed with breast cancer. Per history, her paternal grandmother had ovarian cancer age of 54 and her paternal aunt had ovarian cancer at the age of 46.  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  Thus the plan was made for surveillance ultrasounds and Ca1 25 every 24-month  Interval History: The patient reports overall doing well since her last visit.  She denies any significant changes to her past medical or surgical history.  She denies any changes to her family history.  She had very small amount of pink discharge around the time of her ultrasound, otherwise denies any vaginal bleeding or discharge.  She continues to have some issues with constipation intermittently.  She has developed a more difficult time being able to tell when she needs to void, and has some urine leakage.  She endorses a good appetite with any nausea or vomiting.  Imaging 09/2016 - TVUS ovaries not seen, no adnexal mass 02/2017 - TVUS ovaries not seen, no adnexal mass 08/2017 -TVUS ovaries not seen, no adnexal mass, continued calcified fibroid 02/2018 -TVUS ovaries not seen, no adnexal mass, continued calcified fibroid (unchanged) 10/2018 - TVUS ovaries not seen, no adnexal mass, continued calcified fibroid, 465mfluid collection in LUS.   Past Medical/Surgical  History: Past Medical History:  Diagnosis Date  . BRCA2 positive 2017  . Diverticulosis   . Hearing loss   . HTN (hypertension)   . Memory loss   . Seizure disorder, complex partial, 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[place]  . Seizures ([place]  . Stroke, hemorrhagic ([place]ysm bleed.   . Vestibulitis of ear     Past Surgical History:  Procedure Laterality Date  . broken ankle Right   . CEREBRAL ANEURYSM REPAIR    . TONSILLECTOMY      Family History  Problem Relation Age of Onset  . Dementia Mother   . Stroke Mother   . Thyroid disease Mother   . Osteoporosis Mother   . Ovarian cancer Paternal Grandmother [id number]. Ovarian cancer Paternal Aunt [id number]. Breast cancer Daughter [id number]     breast ca x2, bilateral dx. 38, 46--estrogen  . Other Daughter        no genetic testing as of 07/2015; has had ovaries removed  . Other Daughter        BRCA 2 Pos.  . Colon cancer Maternal Aunt        dx. unspecified age  . Emphysema Maternal Uncle   . High blood pressure Maternal Grandmother   . Dementia Maternal Grandfather   . Bleeding Disorder Maternal Grandfather   . Emphysema Maternal Aunt   . Emphysema Maternal Aunt     Social History   Socioeconomic History  . Marital status: Married    Spouse name: Beth [name]. Number of children: 2  . Years of education: 1274.  Highest education level: Not on file  Occupational History  . Not on file  Tobacco Use  . Smoking status: Former Smoker    Packs/day: 1.00    Years: 41.00    Pack years: 41.00    Quit date: 01/13/2006    Years since quitting: 13.2  . Smokeless tobacco: Never Used  Substance and Sexual Activity  . Alcohol use: No    Alcohol/week: 0.0 standard drinks  . Drug use: No  . Sexual activity: Never    Partners: Male    Birth control/protection: Post-menopausal  Other Topics Concern  . Not on file  Social History Narrative   Patient is married ([name]) and lives at home with her husband.   Patient is  retired.   Patient has two children.   Patient has a high school education.   Patient is right-handed.   Patient drinks 2 big mugs of coffee daily and sometimes tea in the evenings.   Social Determinants of Health   Financial Resource Strain:   . Difficulty of Paying Living Expenses:   Food Insecurity:   . Worried About Charity fundraiser in the Last Year:   . Arboriculturist in the Last Year:   Transportation Needs:   . Film/video editor (Medical):   Marland Kitchen Lack of Transportation (Non-Medical):   Physical Activity:   . Days of Exercise per Week:   . Minutes of Exercise per Session:   Stress:   . Feeling of Stress :   Social Connections:   . Frequency of Communication with Friends and Family:   . Frequency of Social Gatherings with Friends and Family:   . Attends Religious Services:   . Active Member of Clubs or Organizations:   . Attends Archivist Meetings:   Marland Kitchen Marital Status:     Current Medications:  Current Outpatient Medications:  .  albuterol (PROVENTIL HFA;VENTOLIN HFA) 108 (90 Base) MCG/ACT inhaler, Inhale 2 puffs into the lungs every 4 (four) hours as needed for wheezing or shortness of breath., Disp: 1 Inhaler, Rfl: 5 .  aspirin EC 81 MG tablet, Take 81 mg by mouth daily., Disp: , Rfl:  .  atenolol (TENORMIN) 25 MG tablet, Take 25 mg by mouth 2 (two) times daily., Disp: , Rfl:  .  atenolol (TENORMIN) 50 MG tablet, TAKE 1/2 TABLET BY MOUTH TWICE A DAY, Disp: , Rfl:  .  captopril (CAPOTEN) 50 MG tablet, Take 50 mg by mouth 2 (two) times daily., Disp: , Rfl:  .  Cholecalciferol (VITAMIN D3) 2000 units capsule, 1 tablet by mouth daily, Disp: , Rfl:  .  clonazePAM (KLONOPIN) 2 MG tablet, TAKE 1 TABLET AT BEDTIME, Disp: 90 tablet, Rfl: 0 .  fexofenadine (ALLEGRA) 180 MG tablet, Take 180 mg by mouth daily as needed. , Disp: , Rfl:  .  fluticasone (FLONASE) 50 MCG/ACT nasal spray, Place 2 sprays into the nose daily as needed for allergies. Spray twice daily as  needed, Disp: , Rfl:  .  guaiFENesin (MUCINEX) 600 MG 12 hr tablet, Take 1,200 mg by mouth 2 (two) times daily as needed for cough. , Disp: , Rfl:  .  levETIRAcetam 750 MG TB3D, Take 750 mg by mouth 2 (two) times daily., Disp: 180 tablet, Rfl: 0 .  LEXAPRO 20 MG tablet, Take 1 tablet (20 mg total) by mouth daily. Morning dosage, Disp: 90 tablet, Rfl: 3 .  meloxicam (MOBIC) 15 MG tablet, 1 tablet as needed, Disp: , Rfl:  .  OVER THE COUNTER MEDICATION, Citrucel as needed, Disp: , Rfl:  .  simvastatin (ZOCOR) 10 MG tablet, Take 10 mg by mouth at bedtime., Disp: , Rfl:  .  triamterene-hydrochlorothiazide (MAXZIDE-25) 37.5-25 MG tablet, Take 1 tablet by mouth daily., Disp: , Rfl:  .  umeclidinium-vilanterol (ANORO ELLIPTA) 62.5-25 MCG/INH AEPB, Inhale 1 puff into the lungs daily., Disp: 180 each, Rfl: 2  Review of Systems: Pertinent positives as per HPI.  Patient continues to have intermittent shortness of breath and is on home oxygen.  She also has some lower extremity edema. Denies appetite changes, fevers, chills, fatigue, unexplained weight changes. Denies hearing loss, neck lumps or masses, mouth sores, ringing in ears or voice changes. Denies cough or wheezing.   Denies chest pain or palpitations.  Denies abdominal distention, pain, blood in stools, constipation, diarrhea, nausea, vomiting, or early satiety. Denies pain with intercourse, dysuria, frequency, hematuria or incontinence. Denies hot flashes, pelvic pain, vaginal bleeding or vaginal discharge.   Denies joint pain, back pain or muscle pain/cramps. Denies itching, rash, or wounds. Denies dizziness, headaches, numbness or seizures. Denies swollen lymph nodes or glands, denies easy bruising or bleeding. Denies anxiety, depression, confusion, or decreased concentration.  Physical Exam: BP 116/70 (BP Location: Right Wrist, Patient Position: Sitting)   Pulse (!) 50   Temp 98.7 F (37.1 C) (Temporal)   Ht _0  (1.626 m)   Wt 243  lb 2 oz (110.3 kg)   LMP 01/14/1995 (Approximate)   SpO2 99%   BMI 41.73 kg/m  General: Alert, oriented, no acute distress. HEENT: Normocephalic, atraumatic, sclera anicteric. Chest: Mildly labored breathing on 2 L of oxygen. Abdomen: Obese, soft, nontender.  Normoactive bowel sounds.  No masses or hepatosplenomegaly appreciated.   Extremities: Grossly normal range of motion.  Warm, well perfused.  1+ edema bilaterally. Skin: No rashes or lesions noted. GU: Normal appearing external genitalia without erythema, excoriation, or lesions.  Speculum exam reveals moderately atrophic vaginal mucosa, cervix normal in appearance without masses or lesions.  Bimanual exam reveals normal cervix, size of uterus difficult to appreciate due to body habitus.  No adnexal masses appreciated.  Rectovaginal exam confirms these findings, no nodularity, some stool within the rectum.  Laboratory & Radiologic Studies: Pelvic ultrasound on 4/13: Uterus Measurements: 4.1 x 2.3 x 2.8 cm = volume: 14 mL. Atrophic. Anteverted. Heterogeneous myometrium. 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 No additional mass lesions. Nabothian cysts at cervix. Endometrium Thickness: 4 mm.  No definite endometrial fluid or focal abnormality Right ovary Not visualized, question atrophic versus obscured by bowel Left ovary Not visualized, question atrophic versus obscured by bowel Other findings No free pelvic fluid or adnexal masses. IMPRESSION: Nonvisualization of ovaries. Otherwise negative exam.  CA-125 pending  Assessment & Plan: Beth [name] is a 79 y.o. year old with a complicated medical history undergoing surveillance in the setting of a known BRCA2 mutation given that she is a poor surgical candidate.  The patient is overall doing well today without significant complaints.    Reviewed ultrasound findings which show resolution of fluid within the lower uterine  segment. Her ovaries were not appreciated on ultrasound.  A CA 125 was drawn and pending.  I will call her with these results.    The day, the patient asked if we can move visits to yearly.  I suggested that we continue every 54-monthCA-125 and ultrasounds and that I can see her on a yearly basis.  22 minutes of total time was spent for this patient encounter, including preparation, face-to-face counseling with the patient and coordination of care, and documentation of the encounter.  Jeral Pinch, MD  Division of Gynecologic Oncology  Department of Obstetrics and Gynecology  [name] [place]

## 2019-04-29 LAB — CA 125: Cancer Antigen (CA) 125: 11.7 U/mL (ref 0.0–38.1)

## 2019-05-02 ENCOUNTER — Ambulatory Visit (INDEPENDENT_AMBULATORY_CARE_PROVIDER_SITE_OTHER): Payer: Medicare Other | Admitting: Primary Care

## 2019-05-02 ENCOUNTER — Encounter: Payer: Self-pay | Admitting: Primary Care

## 2019-05-02 ENCOUNTER — Other Ambulatory Visit: Payer: Self-pay

## 2019-05-02 VITALS — BP 120/80 | HR 60 | Temp 97.1°F | Ht 64.0 in | Wt 239.8 lb

## 2019-05-02 DIAGNOSIS — J449 Chronic obstructive pulmonary disease, unspecified: Secondary | ICD-10-CM | POA: Diagnosis not present

## 2019-05-02 DIAGNOSIS — J9611 Chronic respiratory failure with hypoxia: Secondary | ICD-10-CM

## 2019-05-02 DIAGNOSIS — J301 Allergic rhinitis due to pollen: Secondary | ICD-10-CM

## 2019-05-02 DIAGNOSIS — J309 Allergic rhinitis, unspecified: Secondary | ICD-10-CM | POA: Insufficient documentation

## 2019-05-02 MED ORDER — TIOTROPIUM BROMIDE-OLODATEROL 2.5-2.5 MCG/ACT IN AERS: 2.0000 | INHALATION_SPRAY | Freq: Every day | RESPIRATORY_TRACT | 3 refills | Status: DC

## 2019-05-02 NOTE — Progress Notes (Signed)
_0  ID: Beth [name], female    DOB: May 23, 1940, 79 y.o.   MRN: [id number]  Chief Complaint  Patient presents with  . Follow-up    sob with exertion.  Allergies.    Referring provider: Kathyrn Lass, MD  HPI: 79 year old female, former smoker. PMH significant for COPD, chronic respiratory failure, HTN, organic brain syndrome, stroke, BRCA2 positive, Obesity. Patient of Dr. Elsworth Soho. She was started on Stiolto during her last visit, however, patient decided to change back to Anoro d/t cost.   Previous [facility] pulmonary encounters: 09/22/2018 Patient contacted today for 3 month follow-up. She is doing well, no acute complaints. Needs refill of her Anoro. She has not needed to use her rescuer inhaler and has not had any recent exacerbations of her COPD. Continues using 2L oxygen. Stopped her maxzide d/t a lower bp reading. States that if she eats a diet with high salt she notices some ankle swelling. Continues Allegra and flonase daily. Taking mucinex as needed for congestion. She has been venturing out of the house a little to meet friends in empty restaurant and wearing mask. Denies fever, cough, sob or wheezing.   05/02/2019 Patient presents today for regular follow-up. Last seen for video visit in September. Her breathing has been ok as long as she takes it easy. She gets winded when she rushes. She is wearing 2L oxygen. She denies any low oxygen levels at home. Right now the pollen has been causing her to have some nasal congestion/post nasal drip and mucus production getting stuck in the back of her throat. She just started taking Claritin. She has not tried nasal sprays but has simple saline and AYR at home. She would like to try going back to [facility], states that she experience some side effects with Anoro. Irregular heart beat, eye floaters and does not like the dry power. These symptoms do not last long. She saw cardiology in October 2020. She has tried pulmonary rehab in the past but is not  interested in returning at this time.   Significant tests/ events  CT angio chest 07/2015 >>no pulmonary embolism, no emphysema, calcified mediastinal LNs  PFTs 09/2015>>severe airway obstruction with FEV1 40%, improves to 47% with albuterol,DLCO decreased to 45%  Allergies  Allergen Reactions  . Prozac [Fluoxetine Hcl] Other (See Comments)    seizures  . Sulfa Antibiotics Nausea And Vomiting and Other (See Comments)    TINGLING IN LEGS ALSO  . Micardis [Telmisartan] Swelling and Other (See Comments)    Swelling, bruising Swelling, bruising  . Prednisone Other (See Comments)    mood changes Mood changes  . Lisinopril-Hydrochlorothiazide Other (See Comments)    unknown  . Augmentin [Amoxicillin-Pot Clavulanate] Rash  . Avelox [Moxifloxacin Hcl In Nacl] Rash    LEVAQUIN IS OK-SEE NOTE  . Norvasc [Amlodipine Besylate] Other (See Comments)    fatigue    Immunization History  Administered Date(s) Administered  . Influenza, High Dose Seasonal PF 10/27/2017, 09/14/2018  . Influenza-Unspecified 10/08/2015  . PFIZER SARS-COV-2 Vaccination 02/02/2019, 02/23/2019    Past Medical History:  Diagnosis Date  . BRCA2 positive 2017  . Diverticulosis   . Hearing loss   . HTN (hypertension)   . Memory loss   . Seizure disorder, complex partial, 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[place]) seizure free for one year  . Seizures ([place])   . Stroke, hemorrhagic ([place]) aneurysm bleed.   . Vestibulitis of ear     Tobacco History: Social History   Tobacco Use  Smoking  Status Former Smoker  . Packs/day: 1.00  . Years: 41.00  . Pack years: 41.00  . Quit date: 01/13/2006  . Years since quitting: 13.3  Smokeless Tobacco Never Used   Counseling given: Not Answered   Outpatient Medications Prior to Visit  Medication Sig Dispense Refill  . albuterol (PROVENTIL HFA;VENTOLIN HFA) 108 (90 Base) MCG/ACT inhaler Inhale 2 puffs into the lungs every 4 (four) hours as needed for wheezing or shortness of  breath. 1 Inhaler 5  . aspirin EC 81 MG tablet Take 81 mg by mouth daily.    Marland Kitchen atenolol (TENORMIN) 25 MG tablet Take 25 mg by mouth 2 (two) times daily.    Marland Kitchen atenolol (TENORMIN) 50 MG tablet TAKE 1/2 TABLET BY MOUTH TWICE A DAY    . captopril (CAPOTEN) 50 MG tablet Take 50 mg by mouth 2 (two) times daily.    . Cholecalciferol (VITAMIN D3) 2000 units capsule 1 tablet by mouth daily    . clonazePAM (KLONOPIN) 2 MG tablet TAKE 1 TABLET AT BEDTIME 90 tablet 0  . fluticasone (FLONASE) 50 MCG/ACT nasal spray Place 2 sprays into the nose daily as needed for allergies. Spray twice daily as needed    . guaiFENesin (MUCINEX) 600 MG 12 hr tablet Take 1,200 mg by mouth 2 (two) times daily as needed for cough.     . levETIRAcetam 750 MG TB3D Take 750 mg by mouth 2 (two) times daily. 180 tablet 0  . LEXAPRO 20 MG tablet Take 1 tablet (20 mg total) by mouth daily. Morning dosage 90 tablet 3  . loratadine (CLARITIN) 10 MG tablet Take 10 mg by mouth daily.    . meloxicam (MOBIC) 15 MG tablet 1 tablet as needed    . OVER THE COUNTER MEDICATION Citrucel as needed    . simvastatin (ZOCOR) 10 MG tablet Take 10 mg by mouth at bedtime.    . triamterene-hydrochlorothiazide (MAXZIDE-25) 37.5-25 MG tablet Take 1 tablet by mouth daily.    Marland Kitchen umeclidinium-vilanterol (ANORO ELLIPTA) 62.5-25 MCG/INH AEPB Inhale 1 puff into the lungs daily. 180 each 2  . fexofenadine (ALLEGRA) 180 MG tablet Take 180 mg by mouth daily as needed.      No facility-administered medications prior to visit.    Review of Systems  Review of Systems  HENT: Positive for congestion and postnasal drip.   Respiratory: Positive for shortness of breath.     Physical Exam  BP 120/80 (BP Location: Right Arm, Cuff Size: Large)   Pulse 60   Temp (!) 97.1 F (36.2 C) (Temporal)   Ht _0  (1.626 m)   Wt 239 lb 12.8 oz (108.8 kg)   LMP 01/14/1995 (Approximate)   SpO2 98%   BMI 41.16 kg/m  Physical Exam Constitutional:      Appearance: Normal  appearance.  HENT:     Head: Normocephalic and atraumatic.     Mouth/Throat:     Mouth: Mucous membranes are moist.     Pharynx: Oropharynx is clear.  Cardiovascular:     Rate and Rhythm: Normal rate and regular rhythm.     Comments: No edema Pulmonary:     Effort: Pulmonary effort is normal.     Breath sounds: Normal breath sounds.     Comments: 2L oxygen Neurological:     Mental Status: She is alert.  Psychiatric:        Mood and Affect: Mood normal.        Behavior: Behavior normal.  Thought Content: Thought content normal.        Judgment: Judgment normal.      Lab Results:  CBC    Component Value Date/Time   WBC 9.4 02/22/2018 1051   WBC 9.1 07/30/2015 2103   RBC 4.19 02/22/2018 1051   HGB 13.5 02/22/2018 1051   HCT 40.3 02/22/2018 1051   PLT 233 02/22/2018 1051   MCV 96.2 02/22/2018 1051   MCH 32.2 02/22/2018 1051   MCHC 33.5 02/22/2018 1051   RDW 12.3 02/22/2018 1051   LYMPHSABS 1.4 02/22/2018 1051   MONOABS 0.9 02/22/2018 1051   EOSABS 0.2 02/22/2018 1051   BASOSABS 0.0 02/22/2018 1051    BMET    Component Value Date/Time   NA 138 02/22/2018 1051   K 4.0 02/22/2018 1051   CL 99 02/22/2018 1051   CO2 28 02/22/2018 1051   GLUCOSE 114 (H) 02/22/2018 1051   BUN 9 02/22/2018 1051   CREATININE 0.85 02/22/2018 1051   CALCIUM 9.5 02/22/2018 1051   GFRNONAA >60 02/22/2018 1051   GFRAA >60 02/22/2018 1051    BNP    Component Value Date/Time   BNP 24.5 07/30/2015 2103    ProBNP No results found for: PROBNP  Imaging: MM 3D SCREEN BREAST BILATERAL  Result Date: 04/18/2019 CLINICAL DATA:  Screening. EXAM: DIGITAL SCREENING BILATERAL MAMMOGRAM WITH TOMO AND CAD COMPARISON:  Previous exam(s). ACR Breast Density Category a: The breast tissue is almost entirely fatty. FINDINGS: There are no findings suspicious for malignancy. Images were processed with CAD. IMPRESSION: No mammographic evidence of malignancy. A result letter of this screening mammogram  will be mailed directly to the patient. RECOMMENDATION: Screening mammogram in one year. (Code:SM-B-01Y) BI-RADS CATEGORY  1: Negative. Electronically Signed   By: Ammie Ferrier M.D.   On: 04/18/2019 15:19   US PELVIC COMPLETE WITH TRANSVAGINAL  Result Date: 04/26/2019 CLINICAL DATA:  BRCA2 positive, surveillance EXAM: TRANSABDOMINAL AND TRANSVAGINAL ULTRASOUND OF PELVIS TECHNIQUE: Both transabdominal and transvaginal ultrasound examinations of the pelvis were performed. Transabdominal technique was performed for global imaging of the pelvis including uterus, ovaries, adnexal regions, and pelvic cul-de-sac. It was necessary to proceed with endovaginal exam following the transabdominal exam to visualize the uterus, endometrium, and ovaries. COMPARISON:  10/26/2018 FINDINGS: Uterus Measurements: 4.1 x 2.3 x 2.8 cm = volume: 14 mL. Atrophic. Anteverted. Heterogeneous myometrium. 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 No additional mass lesions. Nabothian cysts at cervix. Endometrium Thickness: 4 mm.  No definite endometrial fluid or focal abnormality Right ovary Not visualized, question atrophic versus obscured by bowel Left ovary Not visualized, question atrophic versus obscured by bowel Other findings No free pelvic fluid or adnexal masses. IMPRESSION: Nonvisualization of ovaries. Otherwise negative exam. Electronically Signed   By: Lavonia Dana M.D.   On: 04/26/2019 16:38     Assessment & Plan:   COPD without exacerbation ([place]) - Mostly well controlled on LABA/LAMA. She continues to go back and forth between prefrence over formula and some intermittent SEs. She would like to try back on Stiolto respimat. Currently dealing with some seasonal allergies. Ambulatory walk today showed she needs 2L oxygen on exertion to stay above 88%.   Chronic respiratory failure (HCC) - Continue 2 at rest and 2-3L on exertion to keep O2 >88-90%  Allergic  rhinitis - Continue Loratadine 2m daily - Add saline nasal rinse twice daily   EMartyn Ehrich NP 05/02/2019

## 2019-05-02 NOTE — Assessment & Plan Note (Signed)
-   Continue Loratadine 10mg  daily - Add saline nasal rinse twice daily

## 2019-05-02 NOTE — Assessment & Plan Note (Signed)
-   Continue 2 at rest and 2-3L on exertion to keep O2 >88-90%

## 2019-05-02 NOTE — Patient Instructions (Addendum)
Continue Anoro until you get prescription for Stiolto Rx Stiolto 2 puffs once daily   You should use 2-3L oxygen on exertion   Follow-up in 6 months with Dr. Elsworth Soho

## 2019-05-02 NOTE — Assessment & Plan Note (Addendum)
-   Mostly well controlled on LABA/LAMA. She continues to go back and forth between prefrence over formula and some intermittent SEs. She would like to try back on Stiolto respimat. Currently dealing with some seasonal allergies. Ambulatory walk today showed she needs 2L oxygen on exertion to stay above 88%.

## 2019-05-16 ENCOUNTER — Other Ambulatory Visit: Payer: Self-pay

## 2019-05-16 ENCOUNTER — Inpatient Hospital Stay (HOSPITAL_COMMUNITY): Payer: Medicare Other

## 2019-05-16 ENCOUNTER — Inpatient Hospital Stay (HOSPITAL_COMMUNITY)
Admission: EM | Admit: 2019-05-16 | Discharge: 2019-05-24 | DRG: 065 | Disposition: A | Discharge status: Rehabilitation Facility | Payer: Medicare Other | Attending: Internal Medicine | Admitting: Internal Medicine

## 2019-05-16 ENCOUNTER — Encounter (HOSPITAL_COMMUNITY): Payer: Self-pay | Admitting: Internal Medicine

## 2019-05-16 ENCOUNTER — Emergency Department (HOSPITAL_COMMUNITY): Payer: Medicare Other

## 2019-05-16 DIAGNOSIS — E785 Hyperlipidemia, unspecified: Secondary | ICD-10-CM | POA: Diagnosis present

## 2019-05-16 DIAGNOSIS — E871 Hypo-osmolality and hyponatremia: Secondary | ICD-10-CM | POA: Diagnosis not present

## 2019-05-16 DIAGNOSIS — Z803 Family history of malignant neoplasm of breast: Secondary | ICD-10-CM | POA: Diagnosis not present

## 2019-05-16 DIAGNOSIS — R29707 NIHSS score 7: Secondary | ICD-10-CM | POA: Diagnosis present

## 2019-05-16 DIAGNOSIS — I6521 Occlusion and stenosis of right carotid artery: Secondary | ICD-10-CM | POA: Diagnosis not present

## 2019-05-16 DIAGNOSIS — I088 Other rheumatic multiple valve diseases: Secondary | ICD-10-CM | POA: Diagnosis present

## 2019-05-16 DIAGNOSIS — Z87891 Personal history of nicotine dependence: Secondary | ICD-10-CM | POA: Diagnosis not present

## 2019-05-16 DIAGNOSIS — G8194 Hemiplegia, unspecified affecting left nondominant side: Secondary | ICD-10-CM | POA: Diagnosis present

## 2019-05-16 DIAGNOSIS — Z20822 Contact with and (suspected) exposure to covid-19: Secondary | ICD-10-CM | POA: Diagnosis not present

## 2019-05-16 DIAGNOSIS — G8190 Hemiplegia, unspecified affecting unspecified side: Secondary | ICD-10-CM | POA: Diagnosis not present

## 2019-05-16 DIAGNOSIS — I6300 Cerebral infarction due to thrombosis of unspecified precerebral artery: Secondary | ICD-10-CM | POA: Diagnosis not present

## 2019-05-16 DIAGNOSIS — R29705 NIHSS score 5: Secondary | ICD-10-CM | POA: Diagnosis not present

## 2019-05-16 DIAGNOSIS — Z888 Allergy status to other drugs, medicaments and biological substances status: Secondary | ICD-10-CM

## 2019-05-16 DIAGNOSIS — Z79899 Other long term (current) drug therapy: Secondary | ICD-10-CM | POA: Diagnosis not present

## 2019-05-16 DIAGNOSIS — I639 Cerebral infarction, unspecified: Secondary | ICD-10-CM | POA: Diagnosis not present

## 2019-05-16 DIAGNOSIS — M7989 Other specified soft tissue disorders: Secondary | ICD-10-CM | POA: Diagnosis not present

## 2019-05-16 DIAGNOSIS — J9611 Chronic respiratory failure with hypoxia: Secondary | ICD-10-CM | POA: Diagnosis present

## 2019-05-16 DIAGNOSIS — R569 Unspecified convulsions: Secondary | ICD-10-CM

## 2019-05-16 DIAGNOSIS — I63231 Cerebral infarction due to unspecified occlusion or stenosis of right carotid arteries: Secondary | ICD-10-CM

## 2019-05-16 DIAGNOSIS — R7303 Prediabetes: Secondary | ICD-10-CM | POA: Diagnosis present

## 2019-05-16 DIAGNOSIS — I10 Essential (primary) hypertension: Secondary | ICD-10-CM | POA: Diagnosis present

## 2019-05-16 DIAGNOSIS — I4891 Unspecified atrial fibrillation: Secondary | ICD-10-CM | POA: Diagnosis present

## 2019-05-16 DIAGNOSIS — Z1501 Genetic susceptibility to malignant neoplasm of breast: Secondary | ICD-10-CM | POA: Diagnosis not present

## 2019-05-16 DIAGNOSIS — R29810 Facial weakness: Secondary | ICD-10-CM | POA: Diagnosis not present

## 2019-05-16 DIAGNOSIS — Z8673 Personal history of transient ischemic attack (TIA), and cerebral infarction without residual deficits: Secondary | ICD-10-CM

## 2019-05-16 DIAGNOSIS — R29818 Other symptoms and signs involving the nervous system: Secondary | ICD-10-CM | POA: Diagnosis not present

## 2019-05-16 DIAGNOSIS — R0902 Hypoxemia: Secondary | ICD-10-CM | POA: Diagnosis not present

## 2019-05-16 DIAGNOSIS — I739 Peripheral vascular disease, unspecified: Secondary | ICD-10-CM | POA: Diagnosis present

## 2019-05-16 DIAGNOSIS — R531 Weakness: Secondary | ICD-10-CM | POA: Diagnosis not present

## 2019-05-16 DIAGNOSIS — Z881 Allergy status to other antibiotic agents status: Secondary | ICD-10-CM | POA: Diagnosis not present

## 2019-05-16 DIAGNOSIS — M2540 Effusion, unspecified joint: Secondary | ICD-10-CM

## 2019-05-16 DIAGNOSIS — I444 Left anterior fascicular block: Secondary | ICD-10-CM | POA: Diagnosis not present

## 2019-05-16 DIAGNOSIS — M79672 Pain in left foot: Secondary | ICD-10-CM | POA: Diagnosis not present

## 2019-05-16 DIAGNOSIS — I63511 Cerebral infarction due to unspecified occlusion or stenosis of right middle cerebral artery: Secondary | ICD-10-CM | POA: Diagnosis present

## 2019-05-16 DIAGNOSIS — Z7982 Long term (current) use of aspirin: Secondary | ICD-10-CM

## 2019-05-16 DIAGNOSIS — M25572 Pain in left ankle and joints of left foot: Secondary | ICD-10-CM | POA: Diagnosis not present

## 2019-05-16 DIAGNOSIS — K5909 Other constipation: Secondary | ICD-10-CM | POA: Diagnosis present

## 2019-05-16 DIAGNOSIS — K5790 Diverticulosis of intestine, part unspecified, without perforation or abscess without bleeding: Secondary | ICD-10-CM | POA: Diagnosis present

## 2019-05-16 DIAGNOSIS — I313 Pericardial effusion (noninflammatory): Secondary | ICD-10-CM | POA: Diagnosis present

## 2019-05-16 DIAGNOSIS — I63411 Cerebral infarction due to embolism of right middle cerebral artery: Principal | ICD-10-CM | POA: Diagnosis present

## 2019-05-16 DIAGNOSIS — D7589 Other specified diseases of blood and blood-forming organs: Secondary | ICD-10-CM | POA: Diagnosis present

## 2019-05-16 DIAGNOSIS — I69354 Hemiplegia and hemiparesis following cerebral infarction affecting left non-dominant side: Secondary | ICD-10-CM | POA: Diagnosis present

## 2019-05-16 DIAGNOSIS — H9190 Unspecified hearing loss, unspecified ear: Secondary | ICD-10-CM | POA: Diagnosis present

## 2019-05-16 DIAGNOSIS — R414 Neurologic neglect syndrome: Secondary | ICD-10-CM | POA: Diagnosis present

## 2019-05-16 DIAGNOSIS — G40909 Epilepsy, unspecified, not intractable, without status epilepticus: Secondary | ICD-10-CM | POA: Diagnosis present

## 2019-05-16 DIAGNOSIS — Z880 Allergy status to penicillin: Secondary | ICD-10-CM

## 2019-05-16 DIAGNOSIS — J449 Chronic obstructive pulmonary disease, unspecified: Secondary | ICD-10-CM | POA: Diagnosis present

## 2019-05-16 DIAGNOSIS — J410 Simple chronic bronchitis: Secondary | ICD-10-CM | POA: Diagnosis not present

## 2019-05-16 DIAGNOSIS — Z823 Family history of stroke: Secondary | ICD-10-CM

## 2019-05-16 DIAGNOSIS — F419 Anxiety disorder, unspecified: Secondary | ICD-10-CM | POA: Diagnosis present

## 2019-05-16 DIAGNOSIS — S93402A Sprain of unspecified ligament of left ankle, initial encounter: Secondary | ICD-10-CM | POA: Diagnosis present

## 2019-05-16 DIAGNOSIS — Z9981 Dependence on supplemental oxygen: Secondary | ICD-10-CM | POA: Diagnosis not present

## 2019-05-16 DIAGNOSIS — J441 Chronic obstructive pulmonary disease with (acute) exacerbation: Secondary | ICD-10-CM | POA: Diagnosis present

## 2019-05-16 DIAGNOSIS — J439 Emphysema, unspecified: Secondary | ICD-10-CM | POA: Diagnosis present

## 2019-05-16 DIAGNOSIS — F329 Major depressive disorder, single episode, unspecified: Secondary | ICD-10-CM | POA: Diagnosis present

## 2019-05-16 DIAGNOSIS — Z6841 Body Mass Index (BMI) 40.0 and over, adult: Secondary | ICD-10-CM | POA: Diagnosis not present

## 2019-05-16 DIAGNOSIS — Z882 Allergy status to sulfonamides status: Secondary | ICD-10-CM | POA: Diagnosis not present

## 2019-05-16 DIAGNOSIS — Z8679 Personal history of other diseases of the circulatory system: Secondary | ICD-10-CM

## 2019-05-16 DIAGNOSIS — F32A Depression, unspecified: Secondary | ICD-10-CM | POA: Diagnosis present

## 2019-05-16 DIAGNOSIS — R29704 NIHSS score 4: Secondary | ICD-10-CM | POA: Diagnosis not present

## 2019-05-16 DIAGNOSIS — Z7902 Long term (current) use of antithrombotics/antiplatelets: Secondary | ICD-10-CM | POA: Diagnosis not present

## 2019-05-16 DIAGNOSIS — Z825 Family history of asthma and other chronic lower respiratory diseases: Secondary | ICD-10-CM

## 2019-05-16 DIAGNOSIS — I619 Nontraumatic intracerebral hemorrhage, unspecified: Secondary | ICD-10-CM | POA: Diagnosis not present

## 2019-05-16 DIAGNOSIS — Z7901 Long term (current) use of anticoagulants: Secondary | ICD-10-CM | POA: Diagnosis not present

## 2019-05-16 DIAGNOSIS — M6281 Muscle weakness (generalized): Secondary | ICD-10-CM | POA: Diagnosis not present

## 2019-05-16 DIAGNOSIS — I340 Nonrheumatic mitral (valve) insufficiency: Secondary | ICD-10-CM | POA: Diagnosis not present

## 2019-05-16 LAB — COMPREHENSIVE METABOLIC PANEL
ALT: 18 U/L (ref 0–44)
AST: 22 U/L (ref 15–41)
Albumin: 3.6 g/dL (ref 3.5–5.0)
Alkaline Phosphatase: 39 U/L (ref 38–126)
Anion gap: 11 (ref 5–15)
BUN: 9 mg/dL (ref 8–23)
CO2: 24 mmol/L (ref 22–32)
Calcium: 8.9 mg/dL (ref 8.9–10.3)
Chloride: 102 mmol/L (ref 98–111)
Creatinine, Ser: 0.79 mg/dL (ref 0.44–1.00)
GFR calc Af Amer: >60 mL/min (ref >60)
GFR calc non Af Amer: >60 mL/min (ref >60)
Glucose, Bld: 155 mg/dL — ABNORMAL HIGH (ref 70–99)
Potassium: 3.9 mmol/L (ref 3.5–5.1)
Sodium: 137 mmol/L (ref 135–145)
Total Bilirubin: 0.4 mg/dL (ref 0.3–1.2)
Total Protein: 7 g/dL (ref 6.5–8.1)

## 2019-05-16 LAB — URINALYSIS, ROUTINE W REFLEX MICROSCOPIC
Bacteria, UA: NONE SEEN
Bilirubin Urine: NEGATIVE
Glucose, UA: NEGATIVE mg/dL
Hgb urine dipstick: NEGATIVE
Ketones, ur: 5 mg/dL — AB
Nitrite: NEGATIVE
Protein, ur: NEGATIVE mg/dL
Specific Gravity, Urine: >1.046 — ABNORMAL HIGH (ref 1.005–1.030)
pH: 6 (ref 5.0–8.0)

## 2019-05-16 LAB — CBC
HCT: 43.4 % (ref 36.0–46.0)
Hemoglobin: 14.2 g/dL (ref 12.0–15.0)
MCH: 33 pg (ref 26.0–34.0)
MCHC: 32.7 g/dL (ref 30.0–36.0)
MCV: 100.9 fL — ABNORMAL HIGH (ref 80.0–100.0)
Platelets: 274 10*3/uL (ref 150–400)
RBC: 4.3 MIL/uL (ref 3.87–5.11)
RDW: 12.4 % (ref 11.5–15.5)
WBC: 10.2 10*3/uL (ref 4.0–10.5)
nRBC: 0 % (ref 0.0–0.2)

## 2019-05-16 LAB — RESPIRATORY PANEL BY RT PCR (FLU A&B, COVID)
Influenza A by PCR: NEGATIVE
Influenza B by PCR: NEGATIVE
SARS Coronavirus 2 by RT PCR: NEGATIVE

## 2019-05-16 LAB — RAPID URINE DRUG SCREEN, HOSP PERFORMED
Amphetamines: NOT DETECTED
Barbiturates: NOT DETECTED
Benzodiazepines: NOT DETECTED
Cocaine: NOT DETECTED
Opiates: NOT DETECTED
Tetrahydrocannabinol: NOT DETECTED

## 2019-05-16 LAB — DIFFERENTIAL
Abs Immature Granulocytes: 0.06 10*3/uL (ref 0.00–0.07)
Basophils Absolute: 0.1 10*3/uL (ref 0.0–0.1)
Basophils Relative: 1 %
Eosinophils Absolute: 0.2 10*3/uL (ref 0.0–0.5)
Eosinophils Relative: 2 %
Immature Granulocytes: 1 %
Lymphocytes Relative: 24 %
Lymphs Abs: 2.4 10*3/uL (ref 0.7–4.0)
Monocytes Absolute: 0.8 10*3/uL (ref 0.1–1.0)
Monocytes Relative: 8 %
Neutro Abs: 6.6 10*3/uL (ref 1.7–7.7)
Neutrophils Relative %: 64 %

## 2019-05-16 LAB — PROTIME-INR
INR: 0.9 (ref 0.8–1.2)
Prothrombin Time: 12.1 seconds (ref 11.4–15.2)

## 2019-05-16 LAB — I-STAT CHEM 8, ED
BUN: 11 mg/dL (ref 8–23)
Calcium, Ion: 1.13 mmol/L — ABNORMAL LOW (ref 1.15–1.40)
Chloride: 100 mmol/L (ref 98–111)
Creatinine, Ser: 0.7 mg/dL (ref 0.44–1.00)
Glucose, Bld: 147 mg/dL — ABNORMAL HIGH (ref 70–99)
HCT: 43 % (ref 36.0–46.0)
Hemoglobin: 14.6 g/dL (ref 12.0–15.0)
Potassium: 3.8 mmol/L (ref 3.5–5.1)
Sodium: 138 mmol/L (ref 135–145)
TCO2: 26 mmol/L (ref 22–32)

## 2019-05-16 LAB — VITAMIN B12: Vitamin B-12: 280 pg/mL (ref 180–914)

## 2019-05-16 LAB — TSH: TSH: 1.737 u[IU]/mL (ref 0.350–4.500)

## 2019-05-16 LAB — APTT: aPTT: 28 seconds (ref 24–36)

## 2019-05-16 LAB — ETHANOL: Alcohol, Ethyl (B): <10 mg/dL (ref <10)

## 2019-05-16 LAB — CBG MONITORING, ED: Glucose-Capillary: 158 mg/dL — ABNORMAL HIGH (ref 70–99)

## 2019-05-16 MED ORDER — ACETAMINOPHEN 650 MG RE SUPP: 650.0000 mg | RECTAL | Status: DC | PRN

## 2019-05-16 MED ORDER — ACETAMINOPHEN 160 MG/5ML PO SOLN: 650.0000 mg | ORAL | Status: DC | PRN

## 2019-05-16 MED ORDER — LEVETIRACETAM 750 MG PO TABS
750.0000 mg | ORAL_TABLET | Freq: Two times a day (BID) | ORAL | Status: DC
  Administered 2019-05-16 – 2019-05-24 (×16): 750 mg via ORAL
  Filled 2019-05-16 (×18): qty 1

## 2019-05-16 MED ORDER — STROKE: EARLY STAGES OF RECOVERY BOOK
Status: AC
  Filled 2019-05-16: qty 1

## 2019-05-16 MED ORDER — ASPIRIN EC 81 MG PO TBEC
81.0000 mg | DELAYED_RELEASE_TABLET | Freq: Every day | ORAL | Status: DC
  Administered 2019-05-16 – 2019-05-17 (×2): 81 mg via ORAL
  Filled 2019-05-16 (×2): qty 1

## 2019-05-16 MED ORDER — VITAMIN D 25 MCG (1000 UNIT) PO TABS
2000.0000 [IU] | ORAL_TABLET | Freq: Every day | ORAL | Status: DC
  Administered 2019-05-16 – 2019-05-24 (×9): 2000 [IU] via ORAL
  Filled 2019-05-16 (×10): qty 2

## 2019-05-16 MED ORDER — ALBUTEROL SULFATE (2.5 MG/3ML) 0.083% IN NEBU
2.5000 mg | INHALATION_SOLUTION | RESPIRATORY_TRACT | Status: DC | PRN
  Administered 2019-05-19 – 2019-05-24 (×4): 2.5 mg via RESPIRATORY_TRACT
  Filled 2019-05-16 (×4): qty 3

## 2019-05-16 MED ORDER — STROKE: EARLY STAGES OF RECOVERY BOOK
Freq: Once | Status: AC
  Filled 2019-05-16: qty 1

## 2019-05-16 MED ORDER — ESCITALOPRAM OXALATE 10 MG PO TABS
20.0000 mg | ORAL_TABLET | Freq: Every day | ORAL | Status: DC
  Administered 2019-05-17 – 2019-05-24 (×8): 20 mg via ORAL
  Filled 2019-05-16 (×9): qty 2

## 2019-05-16 MED ORDER — IOHEXOL 350 MG/ML SOLN
75.0000 mL | Freq: Once | INTRAVENOUS | Status: AC | PRN
  Administered 2019-05-16: 75 mL via INTRAVENOUS

## 2019-05-16 MED ORDER — ACETAMINOPHEN 325 MG PO TABS
650.0000 mg | ORAL_TABLET | ORAL | Status: DC | PRN
  Administered 2019-05-16 – 2019-05-22 (×5): 650 mg via ORAL
  Filled 2019-05-16 (×5): qty 2

## 2019-05-16 MED ORDER — CLONAZEPAM 0.5 MG PO TABS
2.0000 mg | ORAL_TABLET | Freq: Every day | ORAL | Status: DC
  Administered 2019-05-16 – 2019-05-23 (×8): 2 mg via ORAL
  Filled 2019-05-16 (×8): qty 4

## 2019-05-16 MED ORDER — SIMVASTATIN 20 MG PO TABS
10.0000 mg | ORAL_TABLET | Freq: Every day | ORAL | Status: DC
  Administered 2019-05-16: 10 mg via ORAL
  Filled 2019-05-16: qty 1

## 2019-05-16 NOTE — H&P (Signed)
History and Physical    Beth [name] UJW:[id number] DOB: 1940/07/17 DOA: 05/16/2019  PCP: Kathyrn Lass, MD  Patient coming from: Home I have personally briefly reviewed patient's old medical records in [place]  Chief Complaint: Left-sided weakness started 10 AM  HPI: Beth [name] is a 79 y.o. female with medical history significant of hypertension, hyperlipidemia, depression/anxiety, aneurysmal intracranial hemorrhage in 2008, seizure disorder secondary to [place], Chronic hypoxemic respiratory failure on 2 L of oxygen via nasal cannula secondary to underlying COPD presents to emergency department due to sudden onset of left-sided weakness.  Patient tells me that she was doing fine however she noticed sudden onset of left-sided weakness therefore EMS was called and brought patient to the emergency department for further evaluation and management.  She reports nausea and chronic cough due to underlying COPD.  She denies headache, blurry vision, slurred speech, facial droop, syncope, seizures, head trauma, chest pain, shortness of breath, palpitation, wheezing, leg swelling, vomiting, diarrhea, abdominal pain or any urinary symptoms.  She lives with her husband at home.  Does not use cane or walker.  She is independent on daily life activities.  No history of smoking, alcohol, illicit drug use.  ED Course: Upon arrival: Patient's vital signs stable.  Afebrile with no leukocytosis.  Initial labs such as ethanol, PT/INR, APTT, CMP: WNL.  CBC shows MCV of 100.9.  UA and COVID-19 pending.  CT head negative for acute stroke.  CT angio of head and neck was obtained which shows: No large vessel occlusion.  Plaque at the right ICA origin causes approximately 55% stenosis.  No measurable stenosis at the left ICA origin.  EDP consulted neurology.  Patient is not TPA candidate due to history of aneurysmal intracranial hemorrhage.  MRI ordered and is pending.  [facility] hospitalist consulted for admission  for stroke work-up.  Review of Systems: As per HPI otherwise negative.    Past Medical History:  Diagnosis Date  . BRCA2 positive 2017  . Diverticulosis   . Hearing loss   . HTN (hypertension)   . Memory loss   . Seizure disorder, complex partial, 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[place]) seizure free for one year  . Seizures ([place])   . 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   . Vestibulitis of ear     Past Surgical History:  Procedure Laterality Date  . broken ankle Right   . CEREBRAL ANEURYSM REPAIR    . TONSILLECTOMY       reports that she quit smoking about 13 years ago. She has a 41.00 pack-year smoking history. She has never used smokeless tobacco. She reports that she does not drink alcohol or use drugs.  Allergies  Allergen Reactions  . Prozac [Fluoxetine Hcl] Other (See Comments)    seizures  . Sulfa Antibiotics Nausea And Vomiting and Other (See Comments)    TINGLING IN LEGS ALSO  . Micardis [Telmisartan] Swelling and Other (See Comments)    Swelling, bruising Swelling, bruising  . Prednisone Other (See Comments)    mood changes Mood changes  . Lisinopril-Hydrochlorothiazide Other (See Comments)    unknown  . Augmentin [Amoxicillin-Pot Clavulanate] Rash  . Avelox [Moxifloxacin Hcl In Nacl] Rash    LEVAQUIN IS OK-SEE NOTE  . Norvasc [Amlodipine Besylate] Other (See Comments)    fatigue    Family History  Problem Relation Age of Onset  . Dementia Mother   . Stroke Mother   . Thyroid disease Mother   . Osteoporosis Mother   . Ovarian  cancer Paternal Grandmother 31  . Ovarian cancer Paternal Aunt 57  . Breast cancer Daughter 26       breast ca x2, bilateral dx. 38, 46--estrogen  . Other Daughter        no genetic testing as of 07/2015; has had ovaries removed  . Other Daughter        BRCA 2 Pos.  . Colon cancer Maternal Aunt        dx. unspecified age  . Emphysema Maternal Uncle   . High blood pressure Maternal Grandmother   . Dementia Maternal  Grandfather   . Bleeding Disorder Maternal Grandfather   . Emphysema Maternal Aunt   . Emphysema Maternal Aunt     Prior to Admission medications   Medication Sig Start Date End Date Taking? Authorizing Provider  albuterol (PROVENTIL HFA;VENTOLIN HFA) 108 (90 Base) MCG/ACT inhaler Inhale 2 puffs into the lungs every 4 (four) hours as needed for wheezing or shortness of breath. 04/07/18  Yes Martyn Ehrich, NP  aspirin EC 81 MG tablet Take 81 mg by mouth daily.   Yes [provider]  atenolol (TENORMIN) 50 MG tablet Take 25 mg by mouth 2 (two) times daily.  01/12/15  Yes [provider]  captopril (CAPOTEN) 50 MG tablet Take 50 mg by mouth 2 (two) times daily.   Yes [provider]  Cholecalciferol (VITAMIN D3) 2000 units capsule 1 tablet by mouth daily   Yes [provider]  clonazePAM (KLONOPIN) 2 MG tablet TAKE 1 TABLET AT BEDTIME Patient taking differently: Take 1 mg by mouth daily. For seizure activity or insomnia dohmeier. 04/05/19  Yes Dohmeier, Asencion Partridge, MD  fluticasone (FLONASE) 50 MCG/ACT nasal spray Place 1 spray into the nose daily as needed for allergies. Spray twice daily as needed    Yes [provider]  levETIRAcetam 750 MG TB3D Take 750 mg by mouth 2 (two) times daily. 02/07/19  Yes Dohmeier, Asencion Partridge, MD  LEXAPRO 20 MG tablet Take 1 tablet (20 mg total) by mouth daily. Morning dosage 04/21/13  Yes Dohmeier, Asencion Partridge, MD  simvastatin (ZOCOR) 10 MG tablet Take 10 mg by mouth at bedtime.   Yes [provider]  Tiotropium Bromide-Olodaterol 2.5-2.5 MCG/ACT AERS Inhale 2 puffs into the lungs daily. 05/02/19  Yes Martyn Ehrich, NP  triamterene-hydrochlorothiazide (MAXZIDE-25) 37.5-25 MG tablet Take 1 tablet by mouth daily. 01/12/19  Yes [provider]    Physical Exam: Vitals:   05/16/19 1135 05/16/19 1142  BP: 131/60   Pulse: (!) 57   Resp: 16   Temp: 98 F (36.7 C)   TempSrc: Oral   SpO2: 98%   Weight:  108.6  kg  Height:  '5\' 4"'  (1.626 m)    Constitutional: NAD, calm, comfortable, communicating well, on 2 L of oxygen via nasal cannula.,  Obese Eyes: PERRL, lids and conjunctivae normal ENMT: Mucous membranes are moist. Posterior pharynx clear of any exudate or lesions.Normal dentition.  Neck: normal, supple, no masses, no thyromegaly Respiratory: clear to auscultation bilaterally, no wheezing, no crackles. Normal respiratory effort. No accessory muscle use.  Cardiovascular: Regular rate and rhythm, no murmurs / rubs / gallops. No extremity edema. 2+ pedal pulses. No carotid bruits.  Abdomen: no tenderness, no masses palpated. No hepatosplenomegaly. Bowel sounds positive.  Musculoskeletal: no clubbing / cyanosis. No joint deformity upper and lower extremities. Good ROM, no contractures. Normal muscle tone.  Skin: no rashes, lesions, ulcers. No induration Neurologic: No slurred speech or facial droop noted.  Power 3 out of 5 in left upper and lower extremity.  5 out of 5 in right upper and lower extremity.  Sensory loss on left face, arm and leg.   Psychiatric: Normal judgment and insight. Alert and oriented x 3. Normal mood.    Labs on Admission: I have personally reviewed following labs and imaging studies  CBC: Recent Labs  Lab 05/16/19 1030 05/16/19 1134  WBC 10.2  --   NEUTROABS 6.6  --   HGB 14.2 14.6  HCT 43.4 43.0  MCV 100.9*  --   PLT 274  --    Basic Metabolic Panel: Recent Labs  Lab 05/16/19 1030 05/16/19 1134  NA 137 138  K 3.9 3.8  CL 102 100  CO2 24  --   GLUCOSE 155* 147*  BUN 9 11  CREATININE 0.79 0.70  CALCIUM 8.9  --    GFR: Estimated Creatinine Clearance: 70.9 mL/min (by C-G formula based on SCr of 0.7 mg/dL). Liver Function Tests: Recent Labs  Lab 05/16/19 1030  AST 22  ALT 18  ALKPHOS 39  BILITOT 0.4  PROT 7.0  ALBUMIN 3.6   No results for input(s): LIPASE, AMYLASE in the last 168 hours. No results for input(s): AMMONIA in the last 168  hours. Coagulation Profile: Recent Labs  Lab 05/16/19 1030  INR 0.9   Cardiac Enzymes: No results for input(s): CKTOTAL, CKMB, CKMBINDEX, TROPONINI in the last 168 hours. BNP (last 3 results) No results for input(s): PROBNP in the last 8760 hours. HbA1C: No results for input(s): HGBA1C in the last 72 hours. CBG: Recent Labs  Lab 05/16/19 1128  GLUCAP 158*   Lipid Profile: No results for input(s): CHOL, HDL, LDLCALC, TRIG, CHOLHDL, LDLDIRECT in the last 72 hours. Thyroid Function Tests: No results for input(s): TSH, T4TOTAL, FREET4, T3FREE, THYROIDAB in the last 72 hours. Anemia Panel: No results for input(s): VITAMINB12, FOLATE, FERRITIN, TIBC, IRON, RETICCTPCT in the last 72 hours. Urine analysis:    Component Value Date/Time   COLORURINE YELLOW 08/18/2014 1742   APPEARANCEUR CLEAR 08/18/2014 1742   LABSPEC 1.006 08/18/2014 1742   PHURINE 6.5 08/18/2014 1742   GLUCOSEU NEGATIVE 08/18/2014 1742   HGBUR TRACE (A) 08/18/2014 1742   BILIRUBINUR NEGATIVE 08/18/2014 1742   KETONESUR NEGATIVE 08/18/2014 1742   PROTEINUR NEGATIVE 08/18/2014 1742   UROBILINOGEN 0.2 08/18/2014 1742   NITRITE NEGATIVE 08/18/2014 1742   LEUKOCYTESUR TRACE (A) 08/18/2014 1742    Radiological Exams on Admission: CT Code Stroke CTA Head W/WO contrast  Result Date: 05/16/2019 CLINICAL DATA:  Code stroke follow-up, left-sided weakness EXAM: CT ANGIOGRAPHY HEAD AND NECK TECHNIQUE: Multidetector CT imaging of the head and neck was performed using the standard protocol during bolus administration of intravenous contrast. Multiplanar CT image reconstructions and MIPs were obtained to evaluate the vascular anatomy. Carotid stenosis measurements (when applicable) are obtained utilizing NASCET criteria, using the distal internal carotid diameter as the denominator. CONTRAST:  88m OMNIPAQUE IOHEXOL 350 MG/ML SOLN COMPARISON:  None. FINDINGS: CTA NECK FINDINGS Aortic arch: Calcified and noncalcified plaque along  the arch and patent great vessel origins. Right carotid system: Common carotid is patent with atherosclerotic wall thickening and partially retropharyngeal course. There is primarily calcified plaque at the bifurcation extending along the proximal internal carotid causing approximately 55% stenosis. Remainder of the cervical ICA is patent. There is high-grade stenosis at the external carotid origin. Left carotid system: Common carotid is patent with atherosclerotic wall thickening. Internal and external carotid arteries are patent. There  is mild calcified plaque along the proximal ICA without measurable stenosis. Vertebral arteries: Patent.  No measurable stenosis. Skeleton: Degenerative changes of the cervical spine. Other neck: No mass or adenopathy. Upper chest: Emphysema. Review of the MIP images confirms the above findings CTA HEAD FINDINGS Anterior circulation: Intracranial internal carotid arteries patent with minimal calcified plaque. Anterior and middle cerebral arteries are patent. Posterior circulation: Intracranial vertebral arteries basilar artery, and posterior cerebral arteries are patent. Venous sinuses: As permitted by contrast timing, patent. Review of the MIP images confirms the above findings IMPRESSION: No large vessel occlusion. Plaque at the right ICA origin causes approximately 55% stenosis. There is no measurable stenosis at the left ICA origin. Electronically Signed   By: Macy Mis M.D.   On: 05/16/2019 12:26   CT Code Stroke CTA Neck W/WO contrast  Result Date: 05/16/2019 CLINICAL DATA:  Code stroke follow-up, left-sided weakness EXAM: CT ANGIOGRAPHY HEAD AND NECK TECHNIQUE: Multidetector CT imaging of the head and neck was performed using the standard protocol during bolus administration of intravenous contrast. Multiplanar CT image reconstructions and MIPs were obtained to evaluate the vascular anatomy. Carotid stenosis measurements (when applicable) are obtained utilizing NASCET  criteria, using the distal internal carotid diameter as the denominator. CONTRAST:  24m OMNIPAQUE IOHEXOL 350 MG/ML SOLN COMPARISON:  None. FINDINGS: CTA NECK FINDINGS Aortic arch: Calcified and noncalcified plaque along the arch and patent great vessel origins. Right carotid system: Common carotid is patent with atherosclerotic wall thickening and partially retropharyngeal course. There is primarily calcified plaque at the bifurcation extending along the proximal internal carotid causing approximately 55% stenosis. Remainder of the cervical ICA is patent. There is high-grade stenosis at the external carotid origin. Left carotid system: Common carotid is patent with atherosclerotic wall thickening. Internal and external carotid arteries are patent. There is mild calcified plaque along the proximal ICA without measurable stenosis. Vertebral arteries: Patent.  No measurable stenosis. Skeleton: Degenerative changes of the cervical spine. Other neck: No mass or adenopathy. Upper chest: Emphysema. Review of the MIP images confirms the above findings CTA HEAD FINDINGS Anterior circulation: Intracranial internal carotid arteries patent with minimal calcified plaque. Anterior and middle cerebral arteries are patent. Posterior circulation: Intracranial vertebral arteries basilar artery, and posterior cerebral arteries are patent. Venous sinuses: As permitted by contrast timing, patent. Review of the MIP images confirms the above findings IMPRESSION: No large vessel occlusion. Plaque at the right ICA origin causes approximately 55% stenosis. There is no measurable stenosis at the left ICA origin. Electronically Signed   By: PMacy MisM.D.   On: 05/16/2019 12:26   CT HEAD CODE STROKE WO CONTRAST  Result Date: 05/16/2019 CLINICAL DATA:  Code stroke.  Left-sided weakness EXAM: CT HEAD WITHOUT CONTRAST TECHNIQUE: Contiguous axial images were obtained from the base of the skull through the vertex without intravenous  contrast. COMPARISON:  None. FINDINGS: Brain: There is no acute intracranial hemorrhage or mass effect. No definite acute appearing loss of gray-white differentiation. Questionable loss of gray-white differentiation along the right postcentral gyrus is probably artifactual. Encephalomalacia is again identified within the right temporal lobe extending posterior superiorly to the atrium of the right lateral ventricle. Additional patchy hypoattenuation in the supratentorial white matter is nonspecific but probably reflects stable chronic microvascular ischemic changes. Ventricles are stable in size. Vascular: There is no hyperdense vessel. Intracranial atherosclerotic calcification is present at the skull base. Skull: Calvarium is unremarkable. Sinuses/Orbits: Left maxillary sinus retention cyst or polyp. No acute orbital finding. Other: Mastoid air  cells are clear. ASPECTS ([place] Stroke Program Early CT Score) - Ganglionic level infarction (caudate, lentiform nuclei, internal capsule, insula, M1-M3 cortex): 7 - Supraganglionic infarction (M4-M6 cortex): 3 Total score (0-10 with 10 being normal): 10 IMPRESSION: No acute intracranial hemorrhage or evidence of acute infarction. ASPECT score is 10. These results were called by telephone at the time of interpretation on 05/16/2019 to provider Dr. Cheral Marker, who verbally acknowledged these results. Electronically Signed   By: Macy Mis M.D.   On: 05/16/2019 12:09    EKG: Independently reviewed.  Sinus rhythm, no ST elevation or depression noted.  Assessment/Plan Principal Problem:   CVA (cerebral vascular accident) ([place]) Active Problems:   Stroke, hemorrhagic ([place])   HTN (hypertension)   COPD without exacerbation ([facility])   Seizures ([facility])   HLD (hyperlipidemia)   Macrocytosis   Depression    Ischemic stroke: -Patient presented with left-sided weakness.  Has history of aneurysmal intracranial hemorrhage in 2008.  Reviewed CT head, CTA of head and  neck -admit forTelemetry monitoring.  UA and COVID-19 pending -Stroke protocol -Allow for permissive hypertension for the first 24-48h - only treat PRN if SBP >488 mmHg or diastolic blood pressure >891. Blood pressures can be gradually normalized to SBP<140 upon discharge. -MRI brain is ordered and is pending -Maintain Euthermia. -Ordered echocardiogram to- rule out PFO, Lipid Panel and A1C -Frequent neuro checks -Continue aspirin and statin -Risk factor modification -Consult Neurology-appreciate input -PT/OT eval, Speech consult -We will keep her n.p.o. until she passes a bedside swallow evaluation. -On fall/aspiration precautions  Seizure disorder: -Secondary to underlying aneurysmal intracranial hemorrhage -Continue Keppra. -On seizure precautions  Hypertension: Blood pressure is stable -We will hold home BP meds-atenolol, captopril, triamterene-HCTZ for now to allow permissive hypertension and monitor blood pressure closely  Hyperlipidemia: Check lipid panel -Continue statin  Depression/anxiety: Continue Lexapro and Klonopin  Chronic hypoxemic respiratory failure secondary to underlying COPD:-On 2 L of oxygen at baseline.  Continue same.  On continuous pulse ox. -No wheezing noted on exam.  Continue home inhalers  Macrocytosis: MCV: 100.9.  H&H is stable -We will check TSH, B12 and folate.  DVT prophylaxis: TED/SCD Code Status: Full code-confirmed with the patient Family Communication: Patient's husband present at bedside.  Plan of care discussed with patient and her husband in length and they verbalized understanding and agreed with it. I called patient's daughter Ms. [name] as per patient's request and gave her update and she verbalized understanding. Disposition Plan: To be determined Consults called: Neurology by EDP Admission status: Inpatient   Mckinley Jewel MD [facility]  If 7PM-7AM, please contact night-coverage [email or web address] Password  [email or web address]  05/16/2019, 1:04 PM

## 2019-05-16 NOTE — ED Notes (Signed)
Taken to MRI 

## 2019-05-16 NOTE — Progress Notes (Signed)
Pt arrived to 3W31. Alert and oriented x4, telemetry verified, skin intact, complains of 5/10 headache that has persisted from earlier in the day. PRN Tylenol given. Pt passed yale swallow evaluation, MD Pahwani notified, heart healthy diet ordered.

## 2019-05-16 NOTE — Consult Note (Addendum)
Referring Physician: Dr. Roslynn Amble    Chief Complaint: Acute onset of left sided weakness  HPI: Beth [name] is an 79 y.o. female presenting from home via EMS as a Code Stroke after sudden onset of left sided weakness. She was awake and functioning at her normal baseline at home when she had sudden onset of left sided weakness. EMS was called and on arrival they noted left sided weakness, left sensory loss and left hemineglect.   She has a prior history of aneurysmal ICH in 2008. She is not on a blood thinner. She has a history of seizures secondary to the bleed, but they are infrequent; she is on Keppra 750 mg BID for this indication.    LSN: 1000 tPA Given: No: History of aneurysmal ICH.   Past Medical History:  Diagnosis Date  . BRCA2 positive 2017  . Diverticulosis   . Hearing loss   . HTN (hypertension)   . Memory loss   . 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 Seizures ([place])   . 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   . Vestibulitis of ear     Past Surgical History:  Procedure Laterality Date  . broken ankle Right   . CEREBRAL ANEURYSM REPAIR    . TONSILLECTOMY      Family History  Problem Relation Age of Onset  . Dementia Mother   . Stroke Mother   . Thyroid disease Mother   . Osteoporosis Mother   . Ovarian cancer Paternal Grandmother 56  . Ovarian cancer Paternal Aunt 63  . Breast cancer Daughter 22       breast ca x2, bilateral dx. 38, 46--estrogen  . Other Daughter        no genetic testing as of 07/2015; has had ovaries removed  . Other Daughter        BRCA 2 Pos.  . Colon cancer Maternal Aunt        dx. unspecified age  . Emphysema Maternal Uncle   . High blood pressure Maternal Grandmother   . Dementia Maternal Grandfather   . Bleeding Disorder Maternal Grandfather   . Emphysema Maternal Aunt   . Emphysema Maternal Aunt    Social History:  reports that she quit smoking about 13 years ago.  She has a 41.00 pack-year smoking history. She has never used smokeless tobacco. She reports that she does not drink alcohol or use drugs.  Allergies:  Allergies  Allergen Reactions  . Prozac [Fluoxetine Hcl] Other (See Comments)    seizures  . Sulfa Antibiotics Nausea And Vomiting and Other (See Comments)    TINGLING IN LEGS ALSO  . Micardis [Telmisartan] Swelling and Other (See Comments)    Swelling, bruising Swelling, bruising  . Prednisone Other (See Comments)    mood changes Mood changes  . Lisinopril-Hydrochlorothiazide Other (See Comments)    unknown  . Augmentin [Amoxicillin-Pot Clavulanate] Rash  . Avelox [Moxifloxacin Hcl In Nacl] Rash    LEVAQUIN IS OK-SEE NOTE  . Norvasc [Amlodipine Besylate] Other (See Comments)    fatigue     Home Medications:  No current facility-administered medications on file prior to encounter.   Current Outpatient Medications on File Prior to Encounter  Medication Sig Dispense Refill  . albuterol (PROVENTIL HFA;VENTOLIN HFA) 108 (90 Base) MCG/ACT inhaler Inhale 2 puffs into the lungs every 4 (four) hours as needed for wheezing or shortness of breath. 1 Inhaler 5  . aspirin EC 81 MG tablet  Take 81 mg by mouth daily.    Marland Kitchen atenolol (TENORMIN) 25 MG tablet Take 25 mg by mouth 2 (two) times daily.    Marland Kitchen atenolol (TENORMIN) 50 MG tablet TAKE 1/2 TABLET BY MOUTH TWICE A DAY    . captopril (CAPOTEN) 50 MG tablet Take 50 mg by mouth 2 (two) times daily.    . Cholecalciferol (VITAMIN D3) 2000 units capsule 1 tablet by mouth daily    . clonazePAM (KLONOPIN) 2 MG tablet TAKE 1 TABLET AT BEDTIME 90 tablet 0  . fluticasone (FLONASE) 50 MCG/ACT nasal spray Place 2 sprays into the nose daily as needed for allergies. Spray twice daily as needed    . guaiFENesin (MUCINEX) 600 MG 12 hr tablet Take 1,200 mg by mouth 2 (two) times daily as needed for cough.     . levETIRAcetam 750 MG TB3D Take 750 mg by mouth 2 (two) times daily. 180 tablet 0  . LEXAPRO 20 MG  tablet Take 1 tablet (20 mg total) by mouth daily. Morning dosage 90 tablet 3  . loratadine (CLARITIN) 10 MG tablet Take 10 mg by mouth daily.    . meloxicam (MOBIC) 15 MG tablet 1 tablet as needed    . OVER THE COUNTER MEDICATION Citrucel as needed    . simvastatin (ZOCOR) 10 MG tablet Take 10 mg by mouth at bedtime.    . Tiotropium Bromide-Olodaterol 2.5-2.5 MCG/ACT AERS Inhale 2 puffs into the lungs daily. 4 g 3  . triamterene-hydrochlorothiazide (MAXZIDE-25) 37.5-25 MG tablet Take 1 tablet by mouth daily.       ROS: As per HPI. Additional ROS deferred in the context of acuity of presentation.   Physical Examination: Last menstrual period 01/14/1995.  HEENT: [place]/AT Lungs: Respirations unlabored Ext: No edema  Neurologic Examination: Mental Status: Awake and alert. Fully oriented. Thought content appropriate to situation.  Speech fluent with intact naming and comprehension. Able to follow all commands without difficulty. Cranial Nerves: II:  Left homonymous hemianopsia. PERRL.  III,IV, VI: No ptosis. EOM are full with saccadic pursuits noted, worse when gazing to the left.  V,VII: Subtle left facial weakness, lower quadrant. Decreased temp sensation on the left.  VIII: Hearing intact to voice IX,X: No hypophonia XI: Head slightly rotated to the right.  XII: midline tongue extension  Motor: RUE and RLE 5/5 LUE 2-3/5 LLE 4-/5 Drift of LUE noted.  Sensory: Insensate to FT and temp LUE. Decreased temp and FT sensation to LLE.  Deep Tendon Reflexes:  2+ left biceps and brachioradialis. 4+ left patellar (crossed adductor) 1+ right biceps and brachioradialis.  3+ right patellar.  Plantars: Right: downgoing   Left: Equivocal Cerebellar: Severe ataxia with FNF on the left. Normal FNF on the right.  Gait: Deferred  Results for orders placed or performed during the hospital encounter of 05/16/19 (from the past 48 hour(s))  CBG monitoring, ED     Status: Abnormal   Collection Time:  05/16/19 11:28 AM  Result Value Ref Range   Glucose-Capillary 158 (H) 70 - 99 mg/dL    Comment: Glucose reference range applies only to samples taken after fasting for at least 8 hours.   No results found.  Assessment: 79 y.o. female presenting with acute onset of left hemiparesis and sensory loss. She has a prior history of aneurysmal intracerebral hemorrhage in 2008.  1. Exam reveals left hemiparesis, left hemisensory loss and severe LUE ataxia. Findings localizable to right thalamus versus right pons versus left cerebellum.  2. CT head:  Medium to large-sized area of chronic encephalomalacia involving the right frontoparietal region.  3. CTA of head and neck: No LVO.  4. Stroke Risk Factors - HTN and prior stroke.  5. Not a tPA candidate due to history of aneurysmal ICH.  6. Not an endovascular candidate due to no LVO on CTA.  7. History of seizures secondary to [place].   Recommendations: 1. HgbA1c, fasting lipid panel 2. MRI of the brain without contrast 3. PT consult, OT consult, Speech consult 4. Echocardiogram 5. Frequent neuro checks 6. Prophylactic therapy- Continue ASA and simvastatin. May be a candidate for DAPT pending completion of full stroke work up.  7. Risk factor modification 8. Telemetry monitoring 9. Permissive HTN for 24 hours. Use modified parameters due to prior history of aneurysmal ICH: Correct SBP if > 180.  10. Continue Keppra at her home dose.   '@Electronically'  signed: Dr. Kerney Elbe  05/16/2019, 11:34 AM

## 2019-05-16 NOTE — ED Provider Notes (Signed)
[facility] EMERGENCY DEPARTMENT Provider Note   CSN: [id number] Arrival date & time: 05/16/19  1124  An emergency department physician performed an initial assessment on this suspected stroke patient at 1128.  History Chief Complaint  Patient presents with  . Code Stroke    Beth [name] is a 79 y.o. female.  Presenting to ER with concerns for stroke.  Code stroke.  Level 5 history caveat limited due to acuity.  History limited due to acuity.  In brief, in her normal state of health this morning, suddenly around 10 AM she had noted weakness in her left arm, left leg, difficulty with walking.  Also has decreased sensation on her left side.  No pain, no change in vision, no change in the right side of her body.  She does not feel confused.    HPI     Past Medical History:  Diagnosis Date  . BRCA2 positive 2017  . Diverticulosis   . Hearing loss   . HTN (hypertension)   . Memory loss   . 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 Seizures ([place])   . 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   Marland Kitchen Vestibulitis of ear     Patient Active Problem List   Diagnosis Date Noted  . Seizures ([place])   . HLD (hyperlipidemia)   . Macrocytosis   . CVA (cerebral vascular accident) ([place])   . Depression   . Allergic rhinitis 05/02/2019  . At high risk for breast cancer 12/09/2017  . 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 Obesity, morbid, BMI 40.0-49.9 ([place]) 04/30/2016  . Chronic respiratory failure ([place]) 12/11/2015  . COPD without exacerbation ([place]) 10/08/2015  . Genetic testing 08/26/2015  . 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  . Family history of ovarian cancer 07/26/2015  . 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 Benign paroxysmal positional vertigo 04/06/2014  . Tonic-clonic seizure syndrome ([place]) 04/06/2014  . Pulsatile tinnitus of right ear 04/06/2014  . Other forms of epilepsy  and recurrent seizures without mention of intractable epilepsy 04/21/2013  . SAH (subarachnoid hemorrhage) ([place]) 04/21/2013  . 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 HTN (hypertension)   . Vestibulitis of ear     Past Surgical History:  Procedure Laterality Date  . broken ankle Right   . CEREBRAL ANEURYSM REPAIR    . TONSILLECTOMY       OB History    Gravida  2   Para  2   Term  2   Preterm      AB      Living  2     SAB      TAB      Ectopic      Multiple      Live Births  2           Family History  Problem Relation Age of Onset  . Dementia Mother   . Stroke Mother   . Thyroid disease Mother   . Osteoporosis Mother   . Ovarian cancer Paternal Grandmother 1  . Ovarian cancer Paternal Aunt 20  . Breast cancer Daughter 58       breast ca x2, bilateral dx. 38, 46--estrogen  . Other Daughter        no genetic testing as of 07/2015; has had ovaries removed  . Other Daughter  BRCA 2 Pos.  . Colon cancer Maternal Aunt        dx. unspecified age  . Emphysema Maternal Uncle   . High blood pressure Maternal Grandmother   . Dementia Maternal Grandfather   . Bleeding Disorder Maternal Grandfather   . Emphysema Maternal Aunt   . Emphysema Maternal Aunt     Social History   Tobacco Use  . Smoking status: Former Smoker    Packs/day: 1.00    Years: 41.00    Pack years: 41.00    Quit date: 01/13/2006    Years since quitting: 13.3  . Smokeless tobacco: Never Used  Substance Use Topics  . Alcohol use: No    Alcohol/week: 0.0 standard drinks  . Drug use: No    Home Medications Prior to Admission medications   Medication Sig Start Date End Date Taking? Authorizing Provider  albuterol (PROVENTIL HFA;VENTOLIN HFA) 108 (90 Base) MCG/ACT inhaler Inhale 2 puffs into the lungs every 4 (four) hours as needed for wheezing or shortness of breath. 04/07/18   Yes Martyn Ehrich, NP  aspirin EC 81 MG tablet Take 81 mg by mouth daily.   Yes [provider]  atenolol (TENORMIN) 50 MG tablet Take 25 mg by mouth 2 (two) times daily.  01/12/15  Yes [provider]  captopril (CAPOTEN) 50 MG tablet Take 50 mg by mouth 2 (two) times daily.   Yes [provider]  Cholecalciferol (VITAMIN D3) 2000 units capsule 1 tablet by mouth daily   Yes [provider]  clonazePAM (KLONOPIN) 2 MG tablet TAKE 1 TABLET AT BEDTIME Patient taking differently: Take 1 mg by mouth daily. For seizure activity or insomnia dohmeier. 04/05/19  Yes Dohmeier, Asencion Partridge, MD  fluticasone (FLONASE) 50 MCG/ACT nasal spray Place 1 spray into the nose daily as needed for allergies. Spray twice daily as needed    Yes [provider]  levETIRAcetam 750 MG TB3D Take 750 mg by mouth 2 (two) times daily. 02/07/19  Yes Dohmeier, Asencion Partridge, MD  LEXAPRO 20 MG tablet Take 1 tablet (20 mg total) by mouth daily. Morning dosage 04/21/13  Yes Dohmeier, Asencion Partridge, MD  simvastatin (ZOCOR) 10 MG tablet Take 10 mg by mouth at bedtime.   Yes [provider]  Tiotropium Bromide-Olodaterol 2.5-2.5 MCG/ACT AERS Inhale 2 puffs into the lungs daily. 05/02/19  Yes Martyn Ehrich, NP  triamterene-hydrochlorothiazide (MAXZIDE-25) 37.5-25 MG tablet Take 1 tablet by mouth daily. 01/12/19  Yes [provider]    Allergies    Prozac [fluoxetine hcl], Sulfa antibiotics, Micardis [telmisartan], Prednisone, Lisinopril-hydrochlorothiazide, Augmentin [amoxicillin-pot clavulanate], Avelox [moxifloxacin hcl in nacl], and Norvasc [amlodipine besylate]  Review of Systems   Review of Systems  Constitutional: Negative for chills and fever.  HENT: Negative for ear pain and sore throat.   Eyes: Negative for pain and visual disturbance.  Respiratory: Negative for cough and shortness of breath.   Cardiovascular: Negative for chest pain and palpitations.  Gastrointestinal:  Negative for abdominal pain and vomiting.  Genitourinary: Negative for dysuria and hematuria.  Musculoskeletal: Negative for arthralgias and back pain.  Skin: Negative for color change and rash.  Neurological: Positive for weakness and numbness. Negative for seizures and syncope.  All other systems reviewed and are negative.   Physical Exam Updated Vital Signs BP 131/60   Pulse (!) 57   Temp 98 F (36.7 C) (Oral)   Resp 16   Ht '5\' 4"'  (1.626 m)   Wt 108.6 kg   LMP 01/14/1995 (Approximate)  SpO2 98%   BMI 41.10 kg/m   Physical Exam Vitals and nursing note reviewed.  Constitutional:      General: She is not in acute distress.    Appearance: She is well-developed.  HENT:     Head: Normocephalic and atraumatic.  Eyes:     Conjunctiva/sclera: Conjunctivae normal.  Cardiovascular:     Rate and Rhythm: Normal rate and regular rhythm.     Heart sounds: No murmur.  Pulmonary:     Effort: Pulmonary effort is normal. No respiratory distress.     Breath sounds: Normal breath sounds.  Abdominal:     Palpations: Abdomen is soft.     Tenderness: There is no abdominal tenderness.  Musculoskeletal:     Cervical back: Neck supple.  Skin:    General: Skin is warm and dry.  Neurological:     Mental Status: She is alert.     Comments: AAOx3 CN 2-12 intact, speech clear visual fields intact 3-5 strength in left upper, left lower extremity 5 out of 5 strength in RUE, RLE Sensation to light touch intact in RUE and RLE, decreased over entire left extremities Normal FNF of right arm, unable to perform on left arm Pronator drift present     ED Results / Procedures / Treatments   Labs (all labs ordered are listed, but only abnormal results are displayed) Labs Reviewed  CBC - Abnormal; Notable for the following components:      Result Value   MCV 100.9 (*)    All other components within normal limits  COMPREHENSIVE METABOLIC PANEL - Abnormal; Notable for the following components:     Glucose, Bld 155 (*)    All other components within normal limits  I-STAT CHEM 8, ED - Abnormal; Notable for the following components:   Glucose, Bld 147 (*)    Calcium, Ion 1.13 (*)    All other components within normal limits  CBG MONITORING, ED - Abnormal; Notable for the following components:   Glucose-Capillary 158 (*)    All other components within normal limits  RESPIRATORY PANEL BY RT PCR (FLU A&B, COVID)  ETHANOL  PROTIME-INR  APTT  DIFFERENTIAL  RAPID URINE DRUG SCREEN, HOSP PERFORMED  URINALYSIS, ROUTINE W REFLEX MICROSCOPIC    EKG EKG Interpretation  Date/Time:  Monday May 16 2019 12:18:50 EDT Ventricular Rate:  53 PR Interval:    QRS Duration: 112 QT Interval:  454 QTC Calculation: 427 R Axis:   -114 Text Interpretation: Sinus rhythm Left anterior fascicular block Anterolateral infarct, age indeterminate Confirmed by Madalyn Rob [phone]) on 05/16/2019 12:54:18 PM   Radiology CT Code Stroke CTA Head W/WO contrast  Result Date: 05/16/2019 CLINICAL DATA:  Code stroke follow-up, left-sided weakness EXAM: CT ANGIOGRAPHY HEAD AND NECK TECHNIQUE: Multidetector CT imaging of the head and neck was performed using the standard protocol during bolus administration of intravenous contrast. Multiplanar CT image reconstructions and MIPs were obtained to evaluate the vascular anatomy. Carotid stenosis measurements (when applicable) are obtained utilizing NASCET criteria, using the distal internal carotid diameter as the denominator. CONTRAST:  36m OMNIPAQUE IOHEXOL 350 MG/ML SOLN COMPARISON:  None. FINDINGS: CTA NECK FINDINGS Aortic arch: Calcified and noncalcified plaque along the arch and patent great vessel origins. Right carotid system: Common carotid is patent with atherosclerotic wall thickening and partially retropharyngeal course. There is primarily calcified plaque at the bifurcation extending along the proximal internal carotid causing approximately 55% stenosis.  Remainder of the cervical ICA is patent. There is high-grade stenosis at  the external carotid origin. Left carotid system: Common carotid is patent with atherosclerotic wall thickening. Internal and external carotid arteries are patent. There is mild calcified plaque along the proximal ICA without measurable stenosis. Vertebral arteries: Patent.  No measurable stenosis. Skeleton: Degenerative changes of the cervical spine. Other neck: No mass or adenopathy. Upper chest: Emphysema. Review of the MIP images confirms the above findings CTA HEAD FINDINGS Anterior circulation: Intracranial internal carotid arteries patent with minimal calcified plaque. Anterior and middle cerebral arteries are patent. Posterior circulation: Intracranial vertebral arteries basilar artery, and posterior cerebral arteries are patent. Venous sinuses: As permitted by contrast timing, patent. Review of the MIP images confirms the above findings IMPRESSION: No large vessel occlusion. Plaque at the right ICA origin causes approximately 55% stenosis. There is no measurable stenosis at the left ICA origin. Electronically Signed   By: Macy Mis M.D.   On: 05/16/2019 12:26   CT Code Stroke CTA Neck W/WO contrast  Result Date: 05/16/2019 CLINICAL DATA:  Code stroke follow-up, left-sided weakness EXAM: CT ANGIOGRAPHY HEAD AND NECK TECHNIQUE: Multidetector CT imaging of the head and neck was performed using the standard protocol during bolus administration of intravenous contrast. Multiplanar CT image reconstructions and MIPs were obtained to evaluate the vascular anatomy. Carotid stenosis measurements (when applicable) are obtained utilizing NASCET criteria, using the distal internal carotid diameter as the denominator. CONTRAST:  8m OMNIPAQUE IOHEXOL 350 MG/ML SOLN COMPARISON:  None. FINDINGS: CTA NECK FINDINGS Aortic arch: Calcified and noncalcified plaque along the arch and patent great vessel origins. Right carotid system: Common carotid  is patent with atherosclerotic wall thickening and partially retropharyngeal course. There is primarily calcified plaque at the bifurcation extending along the proximal internal carotid causing approximately 55% stenosis. Remainder of the cervical ICA is patent. There is high-grade stenosis at the external carotid origin. Left carotid system: Common carotid is patent with atherosclerotic wall thickening. Internal and external carotid arteries are patent. There is mild calcified plaque along the proximal ICA without measurable stenosis. Vertebral arteries: Patent.  No measurable stenosis. Skeleton: Degenerative changes of the cervical spine. Other neck: No mass or adenopathy. Upper chest: Emphysema. Review of the MIP images confirms the above findings CTA HEAD FINDINGS Anterior circulation: Intracranial internal carotid arteries patent with minimal calcified plaque. Anterior and middle cerebral arteries are patent. Posterior circulation: Intracranial vertebral arteries basilar artery, and posterior cerebral arteries are patent. Venous sinuses: As permitted by contrast timing, patent. Review of the MIP images confirms the above findings IMPRESSION: No large vessel occlusion. Plaque at the right ICA origin causes approximately 55% stenosis. There is no measurable stenosis at the left ICA origin. Electronically Signed   By: PMacy MisM.D.   On: 05/16/2019 12:26   CT HEAD CODE STROKE WO CONTRAST  Result Date: 05/16/2019 CLINICAL DATA:  Code stroke.  Left-sided weakness EXAM: CT HEAD WITHOUT CONTRAST TECHNIQUE: Contiguous axial images were obtained from the base of the skull through the vertex without intravenous contrast. COMPARISON:  None. FINDINGS: Brain: There is no acute intracranial hemorrhage or mass effect. No definite acute appearing loss of gray-white differentiation. Questionable loss of gray-white differentiation along the right postcentral gyrus is probably artifactual. Encephalomalacia is again  identified within the right temporal lobe extending posterior superiorly to the atrium of the right lateral ventricle. Additional patchy hypoattenuation in the supratentorial white matter is nonspecific but probably reflects stable chronic microvascular ischemic changes. Ventricles are stable in size. Vascular: There is no hyperdense vessel. Intracranial atherosclerotic calcification is present  at the skull base. Skull: Calvarium is unremarkable. Sinuses/Orbits: Left maxillary sinus retention cyst or polyp. No acute orbital finding. Other: Mastoid air cells are clear. ASPECTS ([place] Stroke Program Early CT Score) - Ganglionic level infarction (caudate, lentiform nuclei, internal capsule, insula, M1-M3 cortex): 7 - Supraganglionic infarction (M4-M6 cortex): 3 Total score (0-10 with 10 being normal): 10 IMPRESSION: No acute intracranial hemorrhage or evidence of acute infarction. ASPECT score is 10. These results were called by telephone at the time of interpretation on 05/16/2019 to provider Dr. Cheral Marker, who verbally acknowledged these results. Electronically Signed   By: Macy Mis M.D.   On: 05/16/2019 12:09    Procedures .Critical Care Performed by: Lucrezia Starch, MD Authorized by: Lucrezia Starch, MD   Critical care provider statement:    Critical care time (minutes):  45   Critical care was necessary to treat or prevent imminent or life-threatening deterioration of the following conditions:  CNS failure or compromise   Critical care was time spent personally by me on the following activities:  Discussions with consultants, evaluation of patient's response to treatment, examination of patient, ordering and performing treatments and interventions, ordering and review of laboratory studies, ordering and review of radiographic studies, pulse oximetry, re-evaluation of patient's condition, obtaining history from patient or surrogate and review of old charts   (including critical care  time)  Medications Ordered in ED Medications   stroke: mapping our early stages of recovery book (has no administration in time range)  acetaminophen (TYLENOL) tablet 650 mg (has no administration in time range)    Or  acetaminophen (TYLENOL) 160 MG/5ML solution 650 mg (has no administration in time range)    Or  acetaminophen (TYLENOL) suppository 650 mg (has no administration in time range)  simvastatin (ZOCOR) tablet 10 mg (has no administration in time range)  escitalopram (LEXAPRO) tablet 20 mg (has no administration in time range)  clonazePAM (KLONOPIN) tablet 2 mg (has no administration in time range)  levETIRAcetam TB3D 750 mg (has no administration in time range)  Vitamin D3 2,000 Units (has no administration in time range)  albuterol (VENTOLIN HFA) 108 (90 Base) MCG/ACT inhaler 2 puff (has no administration in time range)  aspirin EC tablet 81 mg (has no administration in time range)  iohexol (OMNIPAQUE) 350 MG/ML injection 75 mL (75 mLs Intravenous Contrast Given 05/16/19 1147)    ED Course  I have reviewed the triage vital signs and the nursing notes.  Pertinent labs & imaging results that were available during my care of the patient were reviewed by me and considered in my medical decision making (see chart for details).  Clinical Course as of May 16 1303  Mon May 16, 2019  1130 At bridge on arrival, accompanied to CT, Cheral Marker also present on arrival   [RD]  1246 Rechecked, symptoms mildly improving, ordered mri, consult to hosp for admit   [RD]  1301 D/w Pahwani   [RD]    Clinical Course User Index [RD] Lucrezia Starch, MD   MDM Rules/Calculators/A&P                      79 year old lady who presented to ER with concern for acute onset left-sided weakness.  Notable medical history for aneurysmal brain bleed.  Neurology and myself evaluated patient on arrival.  Significant left-sided deficits.  Unfortunately due to her medical history, not candidate for TPA.  No  bleed on head CT, no LVO on angio.  Neurology high  suspicion for CVA, recommending full stroke admission.  Ordered MRI brain per neurology request, consult hospitalist for admission.  Dr. Doristine Bosworth admitting.   Final Clinical Impression(s) / ED Diagnoses Final diagnoses:  Cerebrovascular accident (CVA), unspecified mechanism ([place])  Acute left-sided weakness    Rx / DC Orders ED Discharge Orders    None       Lucrezia Starch, MD 05/16/19 [phone]

## 2019-05-16 NOTE — Code Documentation (Signed)
Stroke Response Nurse Documentation Code Documentation  Beth [name] is a 79 y.o. female arriving to [facility] ED via [facility] on 05/16/19 with past medical hx of seizure, hemorrhagic (aneurysm), and hypertension. Code stroke was activated by EMS. Patient from home where she was LKW at 1000 and now complaining of left sided weakness and loss of sensation on left. States he was able to give her nebulizer treatment with both hands earlier.   Stroke team at the bedside on patient arrival. Labs drawn and patient cleared for CT by EDP. Patient to CT with team. NIHSS 7, see documentation for details and code stroke times. Patient with left arm weakness, left leg weakness, left decreased sensation and Sensory  neglect on exam.   The following imaging was completed: CT, CTA head and neck. Patient is not a candidate for tPA due to previous hemorrhagic stroke related to aneurysm bleed. Care/Plan VS/neuro checks Q2 x 12 hrs, then Q4. Bedside handoff with ED RN William Hamburger.    Leverne Humbles Stroke Response RN

## 2019-05-16 NOTE — ED Triage Notes (Signed)
Beth [name] 1000; c/o sudden onset of left sided weakness;

## 2019-05-17 ENCOUNTER — Inpatient Hospital Stay (HOSPITAL_COMMUNITY): Payer: Medicare Other

## 2019-05-17 DIAGNOSIS — I619 Nontraumatic intracerebral hemorrhage, unspecified: Secondary | ICD-10-CM

## 2019-05-17 DIAGNOSIS — J449 Chronic obstructive pulmonary disease, unspecified: Secondary | ICD-10-CM

## 2019-05-17 DIAGNOSIS — I63231 Cerebral infarction due to unspecified occlusion or stenosis of right carotid arteries: Secondary | ICD-10-CM

## 2019-05-17 DIAGNOSIS — I6521 Occlusion and stenosis of right carotid artery: Secondary | ICD-10-CM | POA: Diagnosis not present

## 2019-05-17 DIAGNOSIS — I340 Nonrheumatic mitral (valve) insufficiency: Secondary | ICD-10-CM

## 2019-05-17 DIAGNOSIS — R569 Unspecified convulsions: Secondary | ICD-10-CM

## 2019-05-17 LAB — FOLATE RBC
Folate, Hemolysate: 468 ng/mL
Folate, RBC: 1161 ng/mL (ref >498)
Hematocrit: 40.3 % (ref 34.0–46.6)

## 2019-05-17 LAB — CBC WITH DIFFERENTIAL/PLATELET
Abs Immature Granulocytes: 0.06 10*3/uL (ref 0.00–0.07)
Basophils Absolute: 0 10*3/uL (ref 0.0–0.1)
Basophils Relative: 0 %
Eosinophils Absolute: 0.1 10*3/uL (ref 0.0–0.5)
Eosinophils Relative: 1 %
HCT: 37.7 % (ref 36.0–46.0)
Hemoglobin: 12.9 g/dL (ref 12.0–15.0)
Immature Granulocytes: 1 %
Lymphocytes Relative: 17 %
Lymphs Abs: 2.2 10*3/uL (ref 0.7–4.0)
MCH: 33.2 pg (ref 26.0–34.0)
MCHC: 34.2 g/dL (ref 30.0–36.0)
MCV: 96.9 fL (ref 80.0–100.0)
Monocytes Absolute: 1.2 10*3/uL — ABNORMAL HIGH (ref 0.1–1.0)
Monocytes Relative: 9 %
Neutro Abs: 9 10*3/uL — ABNORMAL HIGH (ref 1.7–7.7)
Neutrophils Relative %: 72 %
Platelets: 247 10*3/uL (ref 150–400)
RBC: 3.89 MIL/uL (ref 3.87–5.11)
RDW: 12.2 % (ref 11.5–15.5)
WBC: 12.5 10*3/uL — ABNORMAL HIGH (ref 4.0–10.5)
nRBC: 0 % (ref 0.0–0.2)

## 2019-05-17 LAB — BASIC METABOLIC PANEL
Anion gap: 7 (ref 5–15)
BUN: 8 mg/dL (ref 8–23)
CO2: 26 mmol/L (ref 22–32)
Calcium: 8.8 mg/dL — ABNORMAL LOW (ref 8.9–10.3)
Chloride: 100 mmol/L (ref 98–111)
Creatinine, Ser: 0.74 mg/dL (ref 0.44–1.00)
GFR calc Af Amer: >60 mL/min (ref >60)
GFR calc non Af Amer: >60 mL/min (ref >60)
Glucose, Bld: 116 mg/dL — ABNORMAL HIGH (ref 70–99)
Potassium: 3.7 mmol/L (ref 3.5–5.1)
Sodium: 133 mmol/L — ABNORMAL LOW (ref 135–145)

## 2019-05-17 LAB — LIPID PANEL
Cholesterol: 151 mg/dL (ref 0–200)
HDL: 39 mg/dL — ABNORMAL LOW (ref >40)
LDL Cholesterol: 78 mg/dL (ref 0–99)
Total CHOL/HDL Ratio: 3.9 RATIO
Triglycerides: 172 mg/dL — ABNORMAL HIGH (ref <150)
VLDL: 34 mg/dL (ref 0–40)

## 2019-05-17 LAB — ECHOCARDIOGRAM COMPLETE
Height: 64 in
Weight: 3830.71 oz

## 2019-05-17 LAB — HEMOGLOBIN A1C
Hgb A1c MFr Bld: 6 % — ABNORMAL HIGH (ref 4.8–5.6)
Mean Plasma Glucose: 125.5 mg/dL

## 2019-05-17 MED ORDER — METOPROLOL TARTRATE 5 MG/5ML IV SOLN
5.0000 mg | Freq: Once | INTRAVENOUS | Status: AC
  Administered 2019-05-17: 5 mg via INTRAVENOUS
  Filled 2019-05-17: qty 5

## 2019-05-17 MED ORDER — DICLOFENAC SODIUM 1 % EX GEL
2.0000 g | Freq: Four times a day (QID) | CUTANEOUS | Status: DC
  Administered 2019-05-17 – 2019-05-19 (×10): 2 g via TOPICAL
  Filled 2019-05-17: qty 100

## 2019-05-17 MED ORDER — DILTIAZEM HCL 25 MG/5ML IV SOLN
15.0000 mg | Freq: Once | INTRAVENOUS | Status: AC
  Administered 2019-05-17: 15 mg via INTRAVENOUS
  Filled 2019-05-17: qty 5

## 2019-05-17 MED ORDER — ASPIRIN EC 325 MG PO TBEC
325.0000 mg | DELAYED_RELEASE_TABLET | Freq: Every day | ORAL | Status: DC
  Administered 2019-05-18 – 2019-05-19 (×2): 325 mg via ORAL
  Filled 2019-05-17 (×2): qty 1

## 2019-05-17 MED ORDER — VITAMIN B-12 100 MCG PO TABS
500.0000 ug | ORAL_TABLET | Freq: Every day | ORAL | Status: DC
  Administered 2019-05-17 – 2019-05-24 (×8): 500 ug via ORAL
  Filled 2019-05-17 (×8): qty 5

## 2019-05-17 MED ORDER — SALINE SPRAY 0.65 % NA SOLN
1.0000 | NASAL | Status: DC | PRN
  Administered 2019-05-22: 1 via NASAL
  Filled 2019-05-17: qty 44

## 2019-05-17 MED ORDER — ATORVASTATIN CALCIUM 40 MG PO TABS
40.0000 mg | ORAL_TABLET | Freq: Every day | ORAL | Status: DC
  Administered 2019-05-17 – 2019-05-24 (×8): 40 mg via ORAL
  Filled 2019-05-17 (×8): qty 1

## 2019-05-17 NOTE — Progress Notes (Signed)
Paged on call about pt's heart rate increasing despite PRN medication.

## 2019-05-17 NOTE — Progress Notes (Signed)
Tele reported new onset Afib RVR with HR sustaining in the 150-160's. Pt assessed in room, she was sitting in recliner chair eating dinner. She states she is asymptomatic, denies chest pain, palpitations, or respiratory distress.  MD Vann notified, 12 leak EKG done, 5mg  IV metoprolol given. Pt remains asymptomatic at this time with HR sustaining in the 140-150's. Yellow MEWS protocol initiated. Rapid response Puja notified of change in status per MEWS protocol.    05/17/19 1821  Assess: MEWS Score  Temp 98.4 F (36.9 C)  BP (!) 164/129  ECG Heart Rate (!) 141  Resp 20  SpO2 98 %  O2 Device Nasal Cannula  O2 Flow Rate (L/min) 2 L/min  Assess: MEWS Score  MEWS Temp 0  MEWS Systolic 0  MEWS Pulse 3  MEWS RR 0  MEWS LOC 0  MEWS Score 3  MEWS Score Color Yellow  Assess: if the MEWS score is Yellow or Red  Were vital signs taken at a resting state? Yes  Focused Assessment Documented focused assessment  Early Detection of Sepsis Score *See Row Information* Low  MEWS guidelines implemented *See Row Information* Yes  Treat  MEWS Interventions Administered prn meds/treatments  Take Vital Signs  Increase Vital Sign Frequency  Yellow: Q 2hr X 2 then Q 4hr X 2, if remains yellow, continue Q 4hrs  Escalate  MEWS: Escalate Yellow: discuss with charge nurse/RN and consider discussing with provider and RRT  Notify: Charge Nurse/RN  Name of Charge Nurse/RN Notified Olivia Mackie RN  Date Charge Nurse/RN Notified 05/17/19  Time Charge Nurse/RN Notified [phone]  Notify: Provider  Provider Name/Title MD Eliseo Squires  Date Provider Notified 05/17/19  Time Provider Notified 1821  Notification Type Page  Notification Reason Change in status  Response See new orders  Date of Provider Response 05/17/19  Time of Provider Response 1829  Notify: Rapid Response  Name of Rapid Response RN Notified Puja RN  Date Rapid Response Notified 05/17/19  Time Rapid Response Notified 1846

## 2019-05-17 NOTE — Consult Note (Signed)
ASSESSMENT & PLAN   RIGHT CAROTID STENOSIS: Potentially symptomatic right carotid stenosis.  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  The carotid bifurcation on the right is very high and I do not think the stenosis is surgically accessible.  Her carotid duplex scan is pending and her echo results are pending.  If the carotid duplex scan shows a significant stenosis on the right I think she might potentially be a candidate for carotid stenting (TCAR).  Pending the carotid duplex results I will review the CT angiogram with my partners to do the 2 carotid stenting.  She is on aspirin.  She is also on a statin.  She is not a smoker.  Given her history of intracranial hemorrhage there was some thought that she should not be on dual antiplatelet therapy.  If she is not a candidate for carotid stenting I think we should consider adding Plavix and that may require input from neurosurgery.  I will follow-up after her carotid duplex scan.  REASON FOR CONSULT:    Right carotid stenosis with right brain stroke.  The consult is requested by Dr. Eliseo Squires.   HPI:   Beth [name] is a 79 y.o. female who developed the sudden onset of left arm weakness at 10 AM yesterday and also some left leg weakness.  She denies any previous history of stroke, TIAs, expressive or receptive aphasia, or amaurosis fugax.  She did have an intracranial hemorrhage in 2008 but does not remember the details of this.  To the best of her recollection she was not evaluated by neurosurgery at that time.  Prior to admission she was on 81 mg of aspirin daily and also on a statin.  She is right-handed.  Her symptoms in the left arm have improved slightly but she still has significant left upper extremity and some mild left lower extremity weakness.  She underwent a CT angiogram of as part of her work-up which showed a 55% right ICA  stenosis with no significant stenosis on the left.  For this reason vascular surgery was consulted.  The risk factors for peripheral vascular disease include hypertension and hypercholesterolemia.  She states that she has borderline diabetes.  She denies any family history of premature cardiovascular disease.  She denies tobacco use.  She does have COPD.  She also has a history of palpitations.   Past Medical History:  Diagnosis Date  . BRCA2 positive 2017  . Diverticulosis   . Hearing loss   . HTN (hypertension)   . Memory loss   . 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 Seizures ([place])   . 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   . Vestibulitis of ear     Family History  Problem Relation Age of Onset  . Dementia Mother   . Stroke Mother   . Thyroid disease Mother   . Osteoporosis Mother   . Ovarian cancer Paternal Grandmother 77  . Ovarian cancer Paternal Aunt 74  . Breast cancer Daughter 51       breast ca x2, bilateral dx. 38, 46--estrogen  . Other Daughter        no genetic testing as of 07/2015; has had ovaries removed  . Other Daughter        BRCA 2 Pos.  . Colon cancer Maternal Aunt  dx. unspecified age  . Emphysema Maternal Uncle   . High blood pressure Maternal Grandmother   . Dementia Maternal Grandfather   . Bleeding Disorder Maternal Grandfather   . Emphysema Maternal Aunt   . Emphysema Maternal Aunt     SOCIAL HISTORY: She quit smoking in 2008. Social History   Tobacco Use  . Smoking status: Former Smoker    Packs/day: 1.00    Years: 41.00    Pack years: 41.00    Quit date: 01/13/2006    Years since quitting: 13.3  . Smokeless tobacco: Never Used  Substance Use Topics  . Alcohol use: No    Alcohol/week: 0.0 standard drinks    Allergies  Allergen Reactions  . Prozac [Fluoxetine Hcl] Other (See Comments)    seizures  . Sulfa Antibiotics Nausea And Vomiting and Other (See Comments)      TINGLING IN LEGS ALSO  . Micardis [Telmisartan] Swelling and Other (See Comments)    Swelling, bruising Swelling, bruising  . Prednisone Other (See Comments)    mood changes Mood changes  . Lisinopril-Hydrochlorothiazide Other (See Comments)    unknown  . Augmentin [Amoxicillin-Pot Clavulanate] Rash  . Avelox [Moxifloxacin Hcl In Nacl] Rash    LEVAQUIN IS OK-SEE NOTE  . Norvasc [Amlodipine Besylate] Other (See Comments)    fatigue    Current Facility-Administered Medications  Medication Dose Route Frequency Provider Last Rate Last Admin  . acetaminophen (TYLENOL) tablet 650 mg  650 mg Oral Q4H PRN Pahwani, Rinka R, MD   650 mg at 05/17/19 0214   Or  . acetaminophen (TYLENOL) 160 MG/5ML solution 650 mg  650 mg Per Tube Q4H PRN Pahwani, Rinka R, MD       Or  . acetaminophen (TYLENOL) suppository 650 mg  650 mg Rectal Q4H PRN Pahwani, Rinka R, MD      . albuterol (PROVENTIL) (2.5 MG/3ML) 0.083% nebulizer solution 2.5 mg  2.5 mg Inhalation Q4H PRN Pahwani, Rinka R, MD      . Derrill Memo ON 05/18/2019] aspirin EC tablet 325 mg  325 mg Oral Daily Rosalin Hawking, MD      . atorvastatin (LIPITOR) tablet 40 mg  40 mg Oral Daily Rosalin Hawking, MD   40 mg at 05/17/19 0927  . cholecalciferol (VITAMIN D3) tablet 2,000 Units  2,000 Units Oral Daily Pahwani, Rinka R, MD   2,000 Units at 05/17/19 0927  . clonazePAM (KLONOPIN) tablet 2 mg  2 mg Oral QHS Pahwani, Rinka R, MD   2 mg at 05/16/19 2113  . diclofenac Sodium (VOLTAREN) 1 % topical gel 2 g  2 g Topical QID Vann, Jessica U, DO   2 g at 05/17/19 1238  . escitalopram (LEXAPRO) tablet 20 mg  20 mg Oral Daily Pahwani, Rinka R, MD   20 mg at 05/17/19 0927  . levETIRAcetam (KEPPRA) tablet 750 mg  750 mg Oral BID Pahwani, Rinka R, MD   750 mg at 05/17/19 0927  . sodium chloride (OCEAN) 0.65 % nasal spray 1 spray  1 spray Each Nare PRN Eulogio Bear U, DO      . vitamin B-12 (CYANOCOBALAMIN) tablet 500 mcg  500 mcg Oral Daily Vann, Jessica U, DO   500 mcg at  05/17/19 1239    REVIEW OF SYSTEMS:  '[X]'  denotes positive finding, '[ ]'  denotes negative finding Cardiac  Comments:  Chest pain or chest pressure:    Shortness of breath upon exertion: x   Short of breath when lying flat:  Irregular heart rhythm: x  palpitations      Vascular    Pain in calf, thigh, or hip brought on by ambulation:    Pain in feet at night that wakes you up from your sleep:     Blood clot in your veins:    Leg swelling:  x       Pulmonary    Oxygen at home:    Productive cough:     Wheezing:  x       Neurologic    Sudden weakness in arms or legs:  x  left arm and leg  Sudden numbness in arms or legs:  x  left arm and leg  Sudden onset of difficulty speaking or slurred speech:    Temporary loss of vision in one eye:     Problems with dizziness:         Gastrointestinal    Blood in stool:     Vomited blood:         Genitourinary    Burning when urinating:     Blood in urine:        Psychiatric    Major depression:         Hematologic    Bleeding problems:    Problems with blood clotting too easily:        Skin    Rashes or ulcers:        Constitutional    Fever or chills:    -  PHYSICAL EXAM:   Vitals:   05/17/19 0128 05/17/19 0340 05/17/19 0853 05/17/19 1130  BP: (!) 158/71 (!) 165/81 (!) 182/72 (!) 158/85  Pulse: 78 75 85 86  Resp: 19 17 (!) 22 20  Temp: 98.2 F (36.8 C) 98.1 F (36.7 C) 97.8 F (36.6 C) 98 F (36.7 C)  TempSrc: Oral Oral Oral Oral  SpO2: 97% 100% 99% 99%  Weight:      Height:       Body mass index is 41.1 kg/m.   GENERAL: The patient is a well-nourished female, in no acute distress. The vital signs are documented above. CARDIAC: There is a regular rate and rhythm.  VASCULAR: I do not detect carotid bruits. On the right, she has a palpable femoral pulse.  I cannot palpate popliteal or pedal pulses.  She has brisk Doppler signals in the right dorsalis pedis, posterior tibial, and peroneal positions. On the  left, she has a palpable femoral pulse.  I cannot palpate popliteal or pedal pulses.  She has a brisk Doppler signal in the left dorsalis pedis, posterior tibial, and peroneal positions. PULMONARY: There is good air exchange bilaterally.  She has some expiratory wheezing. ABDOMEN: Soft and non-tender with normal pitched bowel sounds.  Unable to assess for aneurysm because of her size. MUSCULOSKELETAL: There are no major deformities. NEUROLOGIC: She has significant weakness to grip in the left hand and also some weakness to biceps and triceps in the left arm.  She has mild weakness in the left leg to dorsiflexion of the foot also flexion and extension. She has good strength and sensory function on the right side. SKIN: There are no ulcers or rashes noted. PSYCHIATRIC: The patient has a normal affect.  DATA:    CT ANGIOGRAM HEAD & NECK: I reviewed the CT angiogram of the neck.  There is a calcific plaque at the carotid bifurcation.  This was interpreted by radiology is suggesting a 55% R ICA stenosis.  It is difficult to determine given  the calcific disease.  Of note the bifurcation is very high and there is significant tortuosity just beyond the plaque on the right neck.  There is no significant carotid stenosis on the left.  Vertebral arteries are patent.  There is no significant intracranial disease noted.  MRI HEAD: For MRI shows a small acute right MCA infarct.  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  ECHO: Her echo is complete.  Her results are pending.  CAROTID DUPLEX: Her carotid duplex scan is pending.  Lab Results  Component Value Date   WBC 12.5 (H) 05/17/2019   HGB 12.9 05/17/2019   HCT 37.7 05/17/2019   MCV 96.9 05/17/2019   PLT 247 05/17/2019   Lab Results  Component Value Date   NA 133 (L) 05/17/2019   K 3.7 05/17/2019   CL 100 05/17/2019   CO2 26 05/17/2019   Lab Results  Component Value Date   CREATININE 0.74 05/17/2019   Lab Results  Component Value  Date   INR 0.9 05/16/2019   INR 0.9 06/10/2008   Lab Results  Component Value Date   HGBA1C 6.0 (H) 05/17/2019   CBG (last 3)  Recent Labs    05/16/19 [facility]: [phone] Office: [phone]

## 2019-05-17 NOTE — Progress Notes (Signed)
Alerted by vascular technician patient was not feeling well during exam. Pt assessed at bedside, stated she felt like she was going to be sick on her stomach, poop, pass out, or all of them when the tech placed the probe on her neck. Pt encouraged to take slow deep breaths, and head reclined back in chair. Pt stated she was starting to feel better already. Vitals stable. Will continue to monitor for symptoms .

## 2019-05-17 NOTE — Social Work (Signed)
CSW met with pt at bedside. CSW introduced self and explained her role. CSW completed sbirt with pt.  Pt scored a 0 on the sbirt scale. Pt denied alcohol use. Pt denied substance use. Pt did not need resources at this time.  Beth [name], Latanya Presser, [place] Social Worker [phone]

## 2019-05-17 NOTE — Progress Notes (Signed)
Attempted carotid artery duplex, however patient is in the chair and not feeling well. RN at the bedside. Will attempt later as schedule permits.   05/17/2019 11:39 AM Kelby Aline., MHA, RVT, RDCS, RDMS

## 2019-05-17 NOTE — Progress Notes (Signed)
Progress Note    Beth [name]  [id number] DOB: [birth year]  DOA: 05/16/2019 PCP: Kathyrn Lass, MD    Brief Narrative:     Medical records reviewed and are as summarized below:  Beth [name] is an 79 y.o. female with medical history significant of hypertension, hyperlipidemia, depression/anxiety, aneurysmal intracranial hemorrhage in 2008, seizure disorder secondary to [place], Chronic hypoxemic respiratory failure on 2 L of oxygen via nasal cannula secondary to underlying COPD presents to emergency department due to sudden onset of left-sided weakness.  Assessment/Plan:   Principal Problem:   CVA (cerebral vascular accident) ([place]) Active Problems:   Stroke, hemorrhagic ([place])   HTN (hypertension)   COPD without exacerbation ([facility])   Seizures ([facility])   HLD (hyperlipidemia)   Macrocytosis   Depression   Ischemic stroke: -Patient presented with left-sided weakness.  Has history of aneurysmal intracranial hemorrhage in 2008.   -MRI brain:R MCA infarct, likely artery to artery embolic secondary to R ICA proximal stenosis  -Ordered echocardiogram and is pending along with carotid dopplars -Continue aspirin and statin - Neurology-appreciate input -PT/OT eval, Speech consult  Seizure disorder: -Secondary to underlying aneurysmal intracranial hemorrhage -Continue Keppra. -On seizure precautions  Hypertension: Blood pressure is stable -We will hold home BP meds-atenolol, captopril, triamterene-HCTZ for now to allow permissive hypertension and monitor blood pressure closely  Hyperlipidemia: Check lipid panel -Continue statin -LDL: 78  Depression/anxiety: Continue Lexapro and Klonopin  Chronic hypoxemic respiratory failure secondary to underlying COPD:-On 2 L of oxygen at baseline.   -Continue home inhalers  Low normal B12 -replace PO  obesity Body mass index is 41.1 kg/m.   Family Communication/Anticipated D/C date and plan/Code Status   DVT prophylaxis:  Lovenox ordered. Code Status: Full Code.  Family Communication: called daughter Disposition Plan: Status is: Inpatient  Remains inpatient appropriate because:Ongoing diagnostic testing needed not appropriate for outpatient work up   Dispo: The patient is from: Home              Anticipated d/c is to: Home              Anticipated d/c date is: 2 days              Patient currently is not medically stable to d/c.          Medical Consultants:   Vascular neurology    Subjective:   Complaining of a headache  Objective:    Vitals:   05/16/19 2328 05/17/19 0128 05/17/19 0340 05/17/19 0853  BP: (!) 163/78 (!) 158/71 (!) 165/81 (!) 182/72  Pulse: 81 78 75 85  Resp: 20 19 17  (!) 22  Temp: 98.2 F (36.8 C) 98.2 F (36.8 C) 98.1 F (36.7 C) 97.8 F (36.6 C)  TempSrc: Oral Oral Oral Oral  SpO2: 97% 97% 100% 99%  Weight:      Height:        Intake/Output Summary (Last 24 hours) at 05/17/2019 1037 Last data filed at 05/17/2019 0911 Gross per 24 hour  Intake 892 ml  Output 1450 ml  Net -558 ml   Filed Weights   05/16/19 1142  Weight: 108.6 kg    Exam: In bed, sitting up, nasal cannula above eyebrows Diminished breath sounds but no wheezing heard Regular rate and rhythm Positive bowel sounds, soft, nontender Alert and oriented Patient is weaker on the left side  Data Reviewed:   I have personally reviewed following labs and imaging studies:  Labs: Labs show the following:  Basic Metabolic Panel: Recent Labs  Lab 05/16/19 1030 05/16/19 1030 05/16/19 1134 05/17/19 0346  NA 137  --  138 133*  K 3.9   < > 3.8 3.7  CL 102  --  100 100  CO2 24  --   --  26  GLUCOSE 155*  --  147* 116*  BUN 9  --  11 8  CREATININE 0.79  --  0.70 0.74  CALCIUM 8.9  --   --  8.8*   < > = values in this interval not displayed.   GFR Estimated Creatinine Clearance: 70.9 mL/min (by C-G formula based on SCr of 0.74 mg/dL). Liver Function Tests: Recent Labs  Lab  05/16/19 1030  AST 22  ALT 18  ALKPHOS 39  BILITOT 0.4  PROT 7.0  ALBUMIN 3.6   No results for input(s): LIPASE, AMYLASE in the last 168 hours. No results for input(s): AMMONIA in the last 168 hours. Coagulation profile Recent Labs  Lab 05/16/19 1030  INR 0.9    CBC: Recent Labs  Lab 05/16/19 1030 05/16/19 1134 05/17/19 0346  WBC 10.2  --  12.5*  NEUTROABS 6.6  --  9.0*  HGB 14.2 14.6 12.9  HCT 43.4 43.0 37.7  MCV 100.9*  --  96.9  PLT 274  --  247   Cardiac Enzymes: No results for input(s): CKTOTAL, CKMB, CKMBINDEX, TROPONINI in the last 168 hours. BNP (last 3 results) No results for input(s): PROBNP in the last 8760 hours. CBG: Recent Labs  Lab 05/16/19 1128  GLUCAP 158*   D-Dimer: No results for input(s): DDIMER in the last 72 hours. Hgb A1c: Recent Labs    05/17/19 0346  HGBA1C 6.0*   Lipid Profile: Recent Labs    05/17/19 0346  CHOL 151  HDL 39*  LDLCALC 78  TRIG 172*  CHOLHDL 3.9   Thyroid function studies: Recent Labs    05/16/19 1839  TSH 1.737   Anemia work up: Recent Labs    05/16/19 1839  VITAMINB12 280   Sepsis Labs: Recent Labs  Lab 05/16/19 1030 05/17/19 0346  WBC 10.2 12.5*    Microbiology Recent Results (from the past 240 hour(s))  Respiratory Panel by RT PCR (Flu A&B, Covid) - Nasopharyngeal Swab     Status: None   Collection Time: 05/16/19  2:25 PM   Specimen: Nasopharyngeal Swab  Result Value Ref Range Status   SARS Coronavirus 2 by RT PCR NEGATIVE NEGATIVE Final    Comment: (NOTE) SARS-CoV-2 target nucleic acids are NOT DETECTED. The SARS-CoV-2 RNA is generally detectable in upper respiratoy specimens during the acute phase of infection. The lowest concentration of SARS-CoV-2 viral copies this assay can detect is 131 copies/mL. A negative result does not preclude SARS-Cov-2 infection and should not be used as the sole basis for treatment or other patient management decisions. A negative result may occur  with  improper specimen collection/handling, submission of specimen other than nasopharyngeal swab, presence of viral mutation(s) within the areas targeted by this assay, and inadequate number of viral copies (<131 copies/mL). A negative result must be combined with clinical observations, patient history, and epidemiological information. The expected result is Negative. Fact Sheet for Patients:  PinkCheek.be Fact Sheet for Healthcare Providers:  GravelBags.it This test is not yet ap proved or cleared by the [place] FDA and  has been authorized for detection and/or diagnosis of SARS-CoV-2 by FDA under an Emergency Use Authorization (EUA). This EUA will remain  in effect (meaning this  test can be used) for the duration of the COVID-19 declaration under Section 564(b)(1) of the Act, 21 U.S.C. section 360bbb-3(b)(1), unless the authorization is terminated or revoked sooner.    Influenza A by PCR NEGATIVE NEGATIVE Final   Influenza B by PCR NEGATIVE NEGATIVE Final    Comment: (NOTE) The Xpert Xpress SARS-CoV-2/FLU/RSV assay is intended as an aid in  the diagnosis of influenza from Nasopharyngeal swab specimens and  should not be used as a sole basis for treatment. Nasal washings and  aspirates are unacceptable for Xpert Xpress SARS-CoV-2/FLU/RSV  testing. Fact Sheet for Patients: PinkCheek.be Fact Sheet for Healthcare Providers: GravelBags.it This test is not yet approved or cleared by the [place] FDA and  has been authorized for detection and/or diagnosis of SARS-CoV-2 by  FDA under an Emergency Use Authorization (EUA). This EUA will remain  in effect (meaning this test can be used) for the duration of the  Covid-19 declaration under Section 564(b)(1) of the Act, 21  U.S.C. section 360bbb-3(b)(1), unless the authorization is  terminated or revoked. Performed  at [facility], [place] [address], [place], [place] [address]     Procedures and diagnostic studies:  CT Code Stroke CTA Head W/WO contrast  Result Date: 05/16/2019 CLINICAL DATA:  Code stroke follow-up, left-sided weakness EXAM: CT ANGIOGRAPHY HEAD AND NECK TECHNIQUE: Multidetector CT imaging of the head and neck was performed using the standard protocol during bolus administration of intravenous contrast. Multiplanar CT image reconstructions and MIPs were obtained to evaluate the vascular anatomy. Carotid stenosis measurements (when applicable) are obtained utilizing NASCET criteria, using the distal internal carotid diameter as the denominator. CONTRAST:  19mL OMNIPAQUE IOHEXOL 350 MG/ML SOLN COMPARISON:  None. FINDINGS: CTA NECK FINDINGS Aortic arch: Calcified and noncalcified plaque along the arch and patent great vessel origins. Right carotid system: Common carotid is patent with atherosclerotic wall thickening and partially retropharyngeal course. There is primarily calcified plaque at the bifurcation extending along the proximal internal carotid causing approximately 55% stenosis. Remainder of the cervical ICA is patent. There is high-grade stenosis at the external carotid origin. Left carotid system: Common carotid is patent with atherosclerotic wall thickening. Internal and external carotid arteries are patent. There is mild calcified plaque along the proximal ICA without measurable stenosis. Vertebral arteries: Patent.  No measurable stenosis. Skeleton: Degenerative changes of the cervical spine. Other neck: No mass or adenopathy. Upper chest: Emphysema. Review of the MIP images confirms the above findings CTA HEAD FINDINGS Anterior circulation: Intracranial internal carotid arteries patent with minimal calcified plaque. Anterior and middle cerebral arteries are patent. Posterior circulation: Intracranial vertebral arteries basilar artery, and posterior cerebral arteries are patent. Venous  sinuses: As permitted by contrast timing, patent. Review of the MIP images confirms the above findings IMPRESSION: No large vessel occlusion. Plaque at the right ICA origin causes approximately 55% stenosis. There is no measurable stenosis at the left ICA origin. Electronically Signed   By: Macy Mis M.D.   On: 05/16/2019 12:26   CT Code Stroke CTA Neck W/WO contrast  Result Date: 05/16/2019 CLINICAL DATA:  Code stroke follow-up, left-sided weakness EXAM: CT ANGIOGRAPHY HEAD AND NECK TECHNIQUE: Multidetector CT imaging of the head and neck was performed using the standard protocol during bolus administration of intravenous contrast. Multiplanar CT image reconstructions and MIPs were obtained to evaluate the vascular anatomy. Carotid stenosis measurements (when applicable) are obtained utilizing NASCET criteria, using the distal internal carotid diameter as the denominator. CONTRAST:  75mL OMNIPAQUE IOHEXOL 350  MG/ML SOLN COMPARISON:  None. FINDINGS: CTA NECK FINDINGS Aortic arch: Calcified and noncalcified plaque along the arch and patent great vessel origins. Right carotid system: Common carotid is patent with atherosclerotic wall thickening and partially retropharyngeal course. There is primarily calcified plaque at the bifurcation extending along the proximal internal carotid causing approximately 55% stenosis. Remainder of the cervical ICA is patent. There is high-grade stenosis at the external carotid origin. Left carotid system: Common carotid is patent with atherosclerotic wall thickening. Internal and external carotid arteries are patent. There is mild calcified plaque along the proximal ICA without measurable stenosis. Vertebral arteries: Patent.  No measurable stenosis. Skeleton: Degenerative changes of the cervical spine. Other neck: No mass or adenopathy. Upper chest: Emphysema. Review of the MIP images confirms the above findings CTA HEAD FINDINGS Anterior circulation: Intracranial internal  carotid arteries patent with minimal calcified plaque. Anterior and middle cerebral arteries are patent. Posterior circulation: Intracranial vertebral arteries basilar artery, and posterior cerebral arteries are patent. Venous sinuses: As permitted by contrast timing, patent. Review of the MIP images confirms the above findings IMPRESSION: No large vessel occlusion. Plaque at the right ICA origin causes approximately 55% stenosis. There is no measurable stenosis at the left ICA origin. Electronically Signed   By: Macy Mis M.D.   On: 05/16/2019 12:26   MR BRAIN WO CONTRAST  Result Date: 05/16/2019 CLINICAL DATA:  Suspected stroke. Left-sided weakness. EXAM: MRI HEAD WITHOUT CONTRAST TECHNIQUE: Multiplanar, multiecho pulse sequences of the brain and surrounding structures were obtained without intravenous contrast. COMPARISON:  Head CT 05/16/2019 and MRI 04/21/2017 FINDINGS: Brain: There is a small acute right MCA territory infarct primarily involving cortex in the frontoparietal region. The posterior aspect of the right insula is also involved. A large chronic infarct is again noted laterally in the right temporal lobe with a small amount of associated chronic blood products. Patchy T2 hyperintensities in the cerebral white matter bilaterally have mildly progressed from the prior MRI and are nonspecific but compatible with mildly age advanced chronic small vessel ischemic disease. There is mild global cerebral atrophy. No mass, midline shift, or extra-axial fluid collection is identified. Vascular: Major intracranial vascular flow voids are preserved. Skull and upper cervical spine: No suspicious marrow lesion. Moderate disc and facet degeneration in the cervical spine. Sinuses/Orbits: Bilateral cataract extraction. Unchanged left maxillary sinus mucous retention cyst. Clear mastoid air cells. Other: None. IMPRESSION: 1. Small acute right MCA infarct. 2. Chronic right temporal lobe infarct. 3. Mild chronic  small vessel ischemic disease. Electronically Signed   By: Logan Bores M.D.   On: 05/16/2019 14:42   CT HEAD CODE STROKE WO CONTRAST  Result Date: 05/16/2019 CLINICAL DATA:  Code stroke.  Left-sided weakness EXAM: CT HEAD WITHOUT CONTRAST TECHNIQUE: Contiguous axial images were obtained from the base of the skull through the vertex without intravenous contrast. COMPARISON:  None. FINDINGS: Brain: There is no acute intracranial hemorrhage or mass effect. No definite acute appearing loss of gray-white differentiation. Questionable loss of gray-white differentiation along the right postcentral gyrus is probably artifactual. Encephalomalacia is again identified within the right temporal lobe extending posterior superiorly to the atrium of the right lateral ventricle. Additional patchy hypoattenuation in the supratentorial white matter is nonspecific but probably reflects stable chronic microvascular ischemic changes. Ventricles are stable in size. Vascular: There is no hyperdense vessel. Intracranial atherosclerotic calcification is present at the skull base. Skull: Calvarium is unremarkable. Sinuses/Orbits: Left maxillary sinus retention cyst or polyp. No acute orbital finding. Other: Mastoid air  cells are clear. ASPECTS (Pace Stroke Program Early CT Score) - Ganglionic level infarction (caudate, lentiform nuclei, internal capsule, insula, M1-M3 cortex): 7 - Supraganglionic infarction (M4-M6 cortex): 3 Total score (0-10 with 10 being normal): 10 IMPRESSION: No acute intracranial hemorrhage or evidence of acute infarction. ASPECT score is 10. These results were called by telephone at the time of interpretation on 05/16/2019 to provider Dr. Cheral Marker, who verbally acknowledged these results. Electronically Signed   By: Macy Mis M.D.   On: 05/16/2019 12:09    Medications:   . aspirin EC  81 mg Oral Daily  . atorvastatin  40 mg Oral Daily  . cholecalciferol  2,000 Units Oral Daily  . clonazePAM  2 mg Oral  QHS  . diclofenac Sodium  2 g Topical QID  . escitalopram  20 mg Oral Daily  . levETIRAcetam  750 mg Oral BID   Continuous Infusions:   LOS: 1 day   Geradine Girt  [facility]   How to contact the [facility] Attending or Consulting provider [place] or covering provider during after hours [place], for this patient?  1. Check the care team in [facility] and look for a) attending/consulting [facility] provider listed and b) the [facility] team listed 2. Log into [email or web address] and use [place]'s universal password to access. If you do not have the password, please contact the hospital operator. 3. Locate the [facility] provider you are looking for under [facility] and page to a number that you can be directly reached. 4. If you still have difficulty reaching the provider, please page the [facility] (Director on Call) for the Hospitalists listed on amion for assistance.  05/17/2019, 10:37 AM

## 2019-05-17 NOTE — Evaluation (Signed)
Occupational Therapy Evaluation Patient Details Name: Beth [name] MRN: [id number] DOB: [birth year] Today's Date: 05/17/2019    History of Present Illness Pt is a 79 yo female admitted with sudden onset L side weakness, difficulty walking. 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  Pt with COPD and on home O2.   Clinical Impression   Pt admitted with the above diagnosis and has the deficits outlined below. Pt would benefit from cont OT to increase her independence with all her basic adls and adl transfers as well as increased strength and use of LUE and improve L neglect during adls. Pt lives with her husband.  Do not feel she is safe to d/c at this time due to the many deficits she has, but feel she would benefit from inpt rehab prior to d/c to maximize independence before returning home.      Follow Up Recommendations  CIR;Supervision/Assistance - 24 hour    Equipment Recommendations  3 in 1 bedside commode    Recommendations for Other Services Rehab consult     Precautions / Restrictions Precautions Precautions: Fall Precaution Comments: L neglect Restrictions Weight Bearing Restrictions: No      Mobility Bed Mobility Overal bed mobility: Needs Assistance Bed Mobility: Supine to Sit     Supine to sit: Mod assist;HOB elevated     General bed mobility comments: cues to use LUE as gross assist and cues to remember where her LUE is when sitting in bed or on EOB.  Transfers Overall transfer level: Needs assistance   Transfers: Sit to/from Stand;Stand Pivot Transfers Sit to Stand: Mod assist Stand pivot transfers: Mod assist       General transfer comment: Pt required cues to stand up straight when transferring. Pt wants to wear her sandels that do not have a back on them and they appear to slip on floor.  Instructed that yellow socks are best option for now.    Balance Overall balance assessment: Needs  assistance Sitting-balance support: Feet supported;Single extremity supported Sitting balance-Leahy Scale: Fair     Standing balance support: Bilateral upper extremity supported;During functional activity Standing balance-Leahy Scale: Poor Standing balance comment: Pt heavily reliant on outside assist to stand.                           ADL either performed or assessed with clinical judgement   ADL Overall ADL's : Needs assistance/impaired Eating/Feeding: Sitting;Cueing for safety;Set up;Cueing for compensatory techinques Eating/Feeding Details (indicate cue type and reason): Pt with some pocketing and does not feel L side of face as much as right so reports spilling and food on that side of her mouth that she is unaware of. Grooming: Oral care;Wash/dry hands;Wash/dry face;Minimal assistance;Sitting Grooming Details (indicate cue type and reason): Pt with significant L neglect.  Cued to use LUE as a gross assist during grooming tasks.  Pt with no feeling in the LUE. Upper Body Bathing: Moderate assistance;Sitting;Cueing for compensatory techniques;Cueing for safety   Lower Body Bathing: Maximal assistance;Cueing for compensatory techniques;Cueing for safety;Sit to/from stand   Upper Body Dressing : Maximal assistance;Sitting;Cueing for compensatory techniques;Cueing for safety   Lower Body Dressing: Maximal assistance;Sit to/from stand;Cueing for compensatory techniques;Cueing for safety   Toilet Transfer: Moderate assistance;Stand-pivot;BSC(to her strong side)   Toileting- Clothing Manipulation and Hygiene: Moderate assistance;Sit to/from stand;Cueing for safety;Cueing for compensatory techniques       Functional mobility  during ADLs: Moderate assistance;Cueing for safety General ADL Comments: Pt very limited wtih adls due to L side weakness, L neglect, impulsivity and poor balance and ability to stand.     Vision Baseline Vision/History: Wears glasses Wears Glasses:  At all times Vision Assessment?: Yes Eye Alignment: Within Functional Limits Ocular Range of Motion: Within Functional Limits Alignment/Gaze Preference: Gaze right Tracking/Visual Pursuits: Able to track stimulus in all quads without difficulty Convergence: Within functional limits Visual Fields: No apparent deficits     Perception Perception Perception Tested?: Yes Perception Deficits: Inattention/neglect Inattention/Neglect: Does not attend to left visual field;Does not attend to left side of body   Praxis Praxis Praxis tested?: Within functional limits    Pertinent Vitals/Pain Pain Assessment: No/denies pain     Hand Dominance Right   Extremity/Trunk Assessment Upper Extremity Assessment Upper Extremity Assessment: LUE deficits/detail LUE Deficits / Details: PROM WFL. AROM:  shoulder 100*, elbow WFL, wrist WFL, hand limited grip.   LUE Sensation: decreased light touch;decreased proprioception LUE Coordination: decreased fine motor;decreased gross motor   Lower Extremity Assessment Lower Extremity Assessment: Defer to PT evaluation   Cervical / Trunk Assessment Cervical / Trunk Assessment: Normal   Communication Communication Communication: No difficulties   Cognition Arousal/Alertness: Awake/alert Behavior During Therapy: Impulsive Overall Cognitive Status: Impaired/Different from baseline Area of Impairment: Attention;Safety/judgement;Awareness                   Current Attention Level: Selective     Safety/Judgement: Decreased awareness of safety;Decreased awareness of deficits Awareness: Emergent   General Comments: Pt overall does well but has some mild impulsivity, is hyperverbal and does not know the depth of her deficits.   General Comments  Pt very limited with L side hemiplegia, L side with no feeling and L neglect.      Exercises     Shoulder Instructions      Home Living Family/patient expects to be discharged to:: Private  residence Living Arrangements: Spouse/significant other Available Help at Discharge: Family;Available 24 hours/day Type of Home: House Home Access: Stairs to enter [employer] of Steps: 4 Entrance Stairs-Rails: Right Home Layout: One level     Bathroom Shower/Tub: Walk-in shower;Door   Bathroom Toilet: Handicapped height     Home Equipment: Shower seat          Prior Functioning/Environment Level of Independence: Independent with assistive device(s)        Comments: previously walked without cane or walker.  Did break ankle from past sz.        OT Problem List: Decreased strength;Decreased range of motion;Impaired balance (sitting and/or standing);Impaired vision/perception;Decreased coordination;Decreased cognition;Decreased safety awareness;Decreased knowledge of use of DME or AE;Decreased knowledge of precautions;Cardiopulmonary status limiting activity;Impaired sensation;Impaired tone;Impaired UE functional use;Pain      OT Treatment/Interventions: Self-care/ADL training;Neuromuscular education;Therapeutic activities;Balance training    OT Goals(Current goals can be found in the care plan section) Acute Rehab OT Goals Patient Stated Goal: to get better to care for myself. OT Goal Formulation: With patient Time For Goal Achievement: 05/31/19 Potential to Achieve Goals: Good ADL Goals Pt Will Perform Eating: with set-up;sitting Pt Will Perform Grooming: with set-up;sitting Pt Will Perform Upper Body Bathing: with set-up;sitting Pt Will Perform Upper Body Dressing: with set-up;sitting Pt Will Transfer to Toilet: with min guard assist;stand pivot transfer;bedside commode Pt Will Perform Toileting - Clothing Manipulation and hygiene: with min guard assist;sitting/lateral leans Additional ADL Goal #1: Pt will tend to L arm to make sure it is in her line  of sight for safety reasons with minimal verbal cues.  OT Frequency: Min 2X/week   Barriers to D/C:  Decreased caregiver support  husband home to assist.  Pt has 2 daughters. Unsure where they live.       Co-evaluation              AM-PAC OT "6 Clicks" Daily Activity     Outcome Measure Help from another person eating meals?: A Little Help from another person taking care of personal grooming?: A Little Help from another person toileting, which includes using toliet, bedpan, or urinal?: A Lot Help from another person bathing (including washing, rinsing, drying)?: A Lot Help from another person to put on and taking off regular upper body clothing?: A Lot Help from another person to put on and taking off regular lower body clothing?: A Lot 6 Click Score: 14   End of Session Equipment Utilized During Treatment: Gait belt;Oxygen Nurse Communication: Mobility status  Activity Tolerance: Patient tolerated treatment well Patient left: in chair;with call bell/phone within reach;with chair alarm set  OT Visit Diagnosis: Unsteadiness on feet (R26.81);Other abnormalities of gait and mobility (R26.89);Other symptoms and signs involving the nervous system (R29.898)                Time: [id number] OT Time Calculation (min): 41 min Charges:  OT General Charges $OT Visit: 1 Visit OT Evaluation $OT Eval Moderate Complexity: 1 Mod OT Treatments $Self Care/Home Management : 8-22 mins  Glenford Peers 05/17/2019, 10:43 AM

## 2019-05-17 NOTE — Progress Notes (Signed)
  Echocardiogram 2D Echocardiogram has been performed.  Beth [name] 05/17/2019, 3:05 PM

## 2019-05-17 NOTE — Progress Notes (Signed)
Rehab Admissions Coordinator Note:  Per OT recommendation, this patient was screened by Raechel Ache for appropriateness for an Inpatient Acute Rehab Consult.  At this time, we are recommending Inpatient Rehab consult. AC will place rehab order in the chart per protocol.   Raechel Ache 05/17/2019, 3:46 PM  I can be reached at [phone].

## 2019-05-17 NOTE — Progress Notes (Signed)
Called by nursing to report patient in a fib with rvr.  Will give Iv medicine and monitor for response.  Patient is asymptomatic.  Will not start heparin for now due to intracranial bleed history.

## 2019-05-17 NOTE — Progress Notes (Signed)
SLP Cancellation Note  Patient Details Name: Beth [name] MRN: [id number] DOB: 1940-08-04   Cancelled treatment:       Reason Eval/Treat Not Completed: Other (comment) Pt toileting. Will f/u as able.    Osie Bond., M.A. [facility] Pager [phone] Office [phone]  05/17/2019, 4:07 PM

## 2019-05-17 NOTE — Evaluation (Signed)
Physical Therapy Evaluation Patient Details Name: Beth [name] MRN: [id number] DOB: 03-17-1940 Today's Date: 05/17/2019   History of Present Illness  Pt is a 79 yo female admitted with sudden onset L side weakness, difficulty walking. 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  Pt with COPD and on home O2.  Clinical Impression   Pt admitted with above diagnosis. Patient was independent with mobility prior to CVA. She currently demonstrates left hemiparesis, inattention to left side/environment, with resulting decr balance and inability to walk. Husband is supportive and feel she has the potential to progress to overall min assist (or better) with continued intensive therapies. Pt currently with functional limitations due to the deficits listed below (see PT Problem List). Pt will benefit from skilled PT to increase their independence and safety with mobility to allow discharge to the venue listed below.       Follow Up Recommendations CIR;Supervision/Assistance - 24 hour    Equipment Recommendations  (TBD at next venue)    Recommendations for Other Services Rehab consult     Precautions / Restrictions Precautions Precautions: Fall Precaution Comments: L inattention Restrictions Weight Bearing Restrictions: No      Mobility  Bed Mobility Overal bed mobility: Needs Assistance Bed Mobility: Sit to Sidelying         Sit to sidelying: Min assist General bed mobility comments: cues for technique and to protect LUE; assist to raise LLE onto bed  Transfers Overall transfer level: Needs assistance Equipment used: (upper bed rail) Transfers: Sit to/from [facility] Sit to Stand: Min assist Stand pivot transfers: Mod assist       General transfer comment: arranged pt to transfer to her rt from recliner to bed; able to use RUE to grab upper bed rail and stood with min assist; left knee buckles when pt moving quickly and  not attending to her LLE; support to left knee and for balance  Ambulation/Gait             General Gait Details: unsafe to attempt with 1 person  Stairs            Wheelchair Mobility    Modified Rankin (Stroke Patients Only) Modified Rankin (Stroke Patients Only) Pre-Morbid Rankin Score: No symptoms Modified Rankin: Severe disability     Balance Overall balance assessment: Needs assistance Sitting-balance support: Feet supported Sitting balance-Leahy Scale: Fair Sitting balance - Comments: using RUE to support LUE without cues ~50% of time   Standing balance support: During functional activity;Single extremity supported Standing balance-Leahy Scale: Poor Standing balance comment: Pt heavily reliant on outside assist to stand.                             Pertinent Vitals/Pain Pain Assessment: No/denies pain    Home Living Family/patient expects to be discharged to:: Private residence Living Arrangements: Spouse/significant other Available Help at Discharge: Family;Available 24 hours/day Type of Home: House Home Access: Stairs to enter Entrance Stairs-Rails: Right Entrance Stairs-Number of Steps: 4 Home Layout: One level Home Equipment: Shower seat      Prior Function Level of Independence: Independent with assistive device(s)         Comments: previously walked without cane or walker.  Did break ankle from past sz.     Hand Dominance   Dominant Hand: Right    Extremity/Trunk Assessment   Upper Extremity Assessment Upper Extremity  Assessment: Defer to OT evaluation(+edema left hand; ring mobile but getting tight; elevated)    Lower Extremity Assessment Lower Extremity Assessment: LLE deficits/detail LLE Deficits / Details: AROM WFL; knee extension 3+, ankle DF 5 LLE Sensation: (accurate light touch and proprioception) LLE Coordination: WNL    Cervical / Trunk Assessment Cervical / Trunk Assessment: Other exceptions Cervical  / Trunk Exceptions: obesity  Communication   Communication: No difficulties  Cognition Arousal/Alertness: Awake/alert Behavior During Therapy: Impulsive Overall Cognitive Status: Impaired/Different from baseline Area of Impairment: Attention;Safety/judgement;Awareness                   Current Attention Level: Selective     Safety/Judgement: Decreased awareness of safety;Decreased awareness of deficits Awareness: Emergent   General Comments: Pt overall does well but has some mild impulsivity, is hyperverbal and does not know the depth of her deficits (thinks she can go home with [facility] and does not need inpt rehab)      General Comments General comments (skin integrity, edema, etc.): husband present; he is hoping to get her wedding band off her finger; educated on elevation, retrograde massage and try in a bit    Exercises     Assessment/Plan    PT Assessment Patient needs continued PT services  PT Problem List Decreased strength;Decreased balance;Decreased mobility;Decreased cognition;Decreased knowledge of use of DME;Decreased safety awareness;Impaired sensation;Obesity       PT Treatment Interventions DME instruction;Gait training;Functional mobility training;Therapeutic activities;Balance training;Neuromuscular re-education;Cognitive remediation;Patient/family education    PT Goals (Current goals can be found in the Care Plan section)  Acute Rehab PT Goals Patient Stated Goal: to get better to care for myself. PT Goal Formulation: With patient Time For Goal Achievement: 05/31/19 Potential to Achieve Goals: Good    Frequency Min 4X/week   Barriers to discharge        Co-evaluation               AM-PAC PT "6 Clicks" Mobility  Outcome Measure Help needed turning from your back to your side while in a flat bed without using bedrails?: A Lot Help needed moving from lying on your back to sitting on the side of a flat bed without using bedrails?: A Lot Help  needed moving to and from a bed to a chair (including a wheelchair)?: A Lot Help needed standing up from a chair using your arms (e.g., wheelchair or bedside chair)?: A Little Help needed to walk in hospital room?: Total Help needed climbing 3-5 steps with a railing? : Total 6 Click Score: 11    End of Session Equipment Utilized During Treatment: Gait belt;Oxygen Activity Tolerance: Patient tolerated treatment well Patient left: in bed;with call bell/phone within reach;with bed alarm set;with family/visitor present Nurse Communication: Mobility status PT Visit Diagnosis: Hemiplegia and hemiparesis Hemiplegia - Right/Left: Left Hemiplegia - dominant/non-dominant: Non-dominant Hemiplegia - caused by: Cerebral infarction    Time: [id number] PT Time Calculation (min) (ACUTE ONLY): 37 min   Charges:   PT Evaluation $PT Eval Moderate Complexity: 1 Mod PT Treatments $Therapeutic Activity: 8-22 mins         Arby Barrette, PT Pager [phone]   Rexanne Mano 05/17/2019, 4:38 PM

## 2019-05-17 NOTE — Progress Notes (Signed)
7STROKE TEAM PROGRESS NOTE   INTERVAL HISTORY Her RN and husband is at the bedside.  Pt sitting in chair, still has left hemiparesis, but as per husband, is better than yesterday. Pt stated that she was making coffee yesterday and suddenly had left arm weakness and not able to lift up. 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 Brett Fairy at [facility], currently on keppra 750 bid. However, she can not tell whether the aneurysm was treated / secured.   Vitals:   05/16/19 2328 05/17/19 0128 05/17/19 0340 05/17/19 0853  BP: (!) 163/78 (!) 158/71 (!) 165/81 (!) 182/72  Pulse: 81 78 75 85  Resp: 20 19 17  (!) 22  Temp: 98.2 F (36.8 C) 98.2 F (36.8 C) 98.1 F (36.7 C) 97.8 F (36.6 C)  TempSrc: Oral Oral Oral Oral  SpO2: 97% 97% 100% 99%  Weight:      Height:        CBC:  Recent Labs  Lab 05/16/19 1030 05/16/19 1030 05/16/19 1134 05/17/19 0346  WBC 10.2  --   --  12.5*  NEUTROABS 6.6  --   --  9.0*  HGB 14.2   < > 14.6 12.9  HCT 43.4   < > 43.0 37.7  MCV 100.9*  --   --  96.9  PLT 274  --   --  247   < > = values in this interval not displayed.    Basic Metabolic Panel:  Recent Labs  Lab 05/16/19 1030 05/16/19 1030 05/16/19 1134 05/17/19 0346  NA 137   < > 138 133*  K 3.9   < > 3.8 3.7  CL 102   < > 100 100  CO2 24  --   --  26  GLUCOSE 155*   < > 147* 116*  BUN 9   < > 11 8  CREATININE 0.79   < > 0.70 0.74  CALCIUM 8.9  --   --  8.8*   < > = values in this interval not displayed.   Lipid Panel:     Component Value Date/Time   CHOL 151 05/17/2019 0346   TRIG 172 (H) 05/17/2019 0346   HDL 39 (L) 05/17/2019 0346   CHOLHDL 3.9 05/17/2019 0346   VLDL 34 05/17/2019 0346   LDLCALC 78 05/17/2019 0346   HgbA1c:  Lab Results  Component Value Date   HGBA1C 6.0 (H) 05/17/2019   Urine Drug Screen:     Component Value Date/Time   LABOPIA NONE DETECTED 05/16/2019 1556   COCAINSCRNUR NONE DETECTED  05/16/2019 1556   LABBENZ NONE DETECTED 05/16/2019 1556   AMPHETMU NONE DETECTED 05/16/2019 1556   THCU NONE DETECTED 05/16/2019 1556   LABBARB NONE DETECTED 05/16/2019 1556    Alcohol Level     Component Value Date/Time   ETH <10 05/16/2019 1030    IMAGING past 24 hours CT Code Stroke CTA Head W/WO contrast  Result Date: 05/16/2019 CLINICAL DATA:  Code stroke follow-up, left-sided weakness EXAM: CT ANGIOGRAPHY HEAD AND NECK TECHNIQUE: Multidetector CT imaging of the head and neck was performed using the standard protocol during bolus administration of intravenous contrast. Multiplanar CT image reconstructions and MIPs were obtained to evaluate the vascular anatomy. Carotid stenosis measurements (when applicable) are obtained utilizing NASCET criteria, using the distal internal carotid diameter as the denominator. CONTRAST:  5mL OMNIPAQUE IOHEXOL 350 MG/ML SOLN COMPARISON:  None. FINDINGS: CTA NECK  FINDINGS Aortic arch: Calcified and noncalcified plaque along the arch and patent great vessel origins. Right carotid system: Common carotid is patent with atherosclerotic wall thickening and partially retropharyngeal course. There is primarily calcified plaque at the bifurcation extending along the proximal internal carotid causing approximately 55% stenosis. Remainder of the cervical ICA is patent. There is high-grade stenosis at the external carotid origin. Left carotid system: Common carotid is patent with atherosclerotic wall thickening. Internal and external carotid arteries are patent. There is mild calcified plaque along the proximal ICA without measurable stenosis. Vertebral arteries: Patent.  No measurable stenosis. Skeleton: Degenerative changes of the cervical spine. Other neck: No mass or adenopathy. Upper chest: Emphysema. Review of the MIP images confirms the above findings CTA HEAD FINDINGS Anterior circulation: Intracranial internal carotid arteries patent with minimal calcified plaque.  Anterior and middle cerebral arteries are patent. Posterior circulation: Intracranial vertebral arteries basilar artery, and posterior cerebral arteries are patent. Venous sinuses: As permitted by contrast timing, patent. Review of the MIP images confirms the above findings IMPRESSION: No large vessel occlusion. Plaque at the right ICA origin causes approximately 55% stenosis. There is no measurable stenosis at the left ICA origin. Electronically Signed   By: Macy Mis M.D.   On: 05/16/2019 12:26   CT Code Stroke CTA Neck W/WO contrast  Result Date: 05/16/2019 CLINICAL DATA:  Code stroke follow-up, left-sided weakness EXAM: CT ANGIOGRAPHY HEAD AND NECK TECHNIQUE: Multidetector CT imaging of the head and neck was performed using the standard protocol during bolus administration of intravenous contrast. Multiplanar CT image reconstructions and MIPs were obtained to evaluate the vascular anatomy. Carotid stenosis measurements (when applicable) are obtained utilizing NASCET criteria, using the distal internal carotid diameter as the denominator. CONTRAST:  23mL OMNIPAQUE IOHEXOL 350 MG/ML SOLN COMPARISON:  None. FINDINGS: CTA NECK FINDINGS Aortic arch: Calcified and noncalcified plaque along the arch and patent great vessel origins. Right carotid system: Common carotid is patent with atherosclerotic wall thickening and partially retropharyngeal course. There is primarily calcified plaque at the bifurcation extending along the proximal internal carotid causing approximately 55% stenosis. Remainder of the cervical ICA is patent. There is high-grade stenosis at the external carotid origin. Left carotid system: Common carotid is patent with atherosclerotic wall thickening. Internal and external carotid arteries are patent. There is mild calcified plaque along the proximal ICA without measurable stenosis. Vertebral arteries: Patent.  No measurable stenosis. Skeleton: Degenerative changes of the cervical spine. Other  neck: No mass or adenopathy. Upper chest: Emphysema. Review of the MIP images confirms the above findings CTA HEAD FINDINGS Anterior circulation: Intracranial internal carotid arteries patent with minimal calcified plaque. Anterior and middle cerebral arteries are patent. Posterior circulation: Intracranial vertebral arteries basilar artery, and posterior cerebral arteries are patent. Venous sinuses: As permitted by contrast timing, patent. Review of the MIP images confirms the above findings IMPRESSION: No large vessel occlusion. Plaque at the right ICA origin causes approximately 55% stenosis. There is no measurable stenosis at the left ICA origin. Electronically Signed   By: Macy Mis M.D.   On: 05/16/2019 12:26   MR BRAIN WO CONTRAST  Result Date: 05/16/2019 CLINICAL DATA:  Suspected stroke. Left-sided weakness. EXAM: MRI HEAD WITHOUT CONTRAST TECHNIQUE: Multiplanar, multiecho pulse sequences of the brain and surrounding structures were obtained without intravenous contrast. COMPARISON:  Head CT 05/16/2019 and MRI 04/21/2017 FINDINGS: Brain: There is a small acute right MCA territory infarct primarily involving cortex in the frontoparietal region. The posterior aspect of the right insula is  also involved. A large chronic infarct is again noted laterally in the right temporal lobe with a small amount of associated chronic blood products. Patchy T2 hyperintensities in the cerebral white matter bilaterally have mildly progressed from the prior MRI and are nonspecific but compatible with mildly age advanced chronic small vessel ischemic disease. There is mild global cerebral atrophy. No mass, midline shift, or extra-axial fluid collection is identified. Vascular: Major intracranial vascular flow voids are preserved. Skull and upper cervical spine: No suspicious marrow lesion. Moderate disc and facet degeneration in the cervical spine. Sinuses/Orbits: Bilateral cataract extraction. Unchanged left maxillary  sinus mucous retention cyst. Clear mastoid air cells. Other: None. IMPRESSION: 1. Small acute right MCA infarct. 2. Chronic right temporal lobe infarct. 3. Mild chronic small vessel ischemic disease. Electronically Signed   By: Logan Bores M.D.   On: 05/16/2019 14:42   CT HEAD CODE STROKE WO CONTRAST  Result Date: 05/16/2019 CLINICAL DATA:  Code stroke.  Left-sided weakness EXAM: CT HEAD WITHOUT CONTRAST TECHNIQUE: Contiguous axial images were obtained from the base of the skull through the vertex without intravenous contrast. COMPARISON:  None. FINDINGS: Brain: There is no acute intracranial hemorrhage or mass effect. No definite acute appearing loss of gray-white differentiation. Questionable loss of gray-white differentiation along the right postcentral gyrus is probably artifactual. Encephalomalacia is again identified within the right temporal lobe extending posterior superiorly to the atrium of the right lateral ventricle. Additional patchy hypoattenuation in the supratentorial white matter is nonspecific but probably reflects stable chronic microvascular ischemic changes. Ventricles are stable in size. Vascular: There is no hyperdense vessel. Intracranial atherosclerotic calcification is present at the skull base. Skull: Calvarium is unremarkable. Sinuses/Orbits: Left maxillary sinus retention cyst or polyp. No acute orbital finding. Other: Mastoid air cells are clear. ASPECTS ([place] Stroke Program Early CT Score) - Ganglionic level infarction (caudate, lentiform nuclei, internal capsule, insula, M1-M3 cortex): 7 - Supraganglionic infarction (M4-M6 cortex): 3 Total score (0-10 with 10 being normal): 10 IMPRESSION: No acute intracranial hemorrhage or evidence of acute infarction. ASPECT score is 10. These results were called by telephone at the time of interpretation on 05/16/2019 to provider Dr. Cheral Marker, who verbally acknowledged these results. Electronically Signed   By: Macy Mis M.D.   On:  05/16/2019 12:09    PHYSICAL EXAM  Temp:  [97.8 F (36.6 C)-99.1 F (37.3 C)] 98 F (36.7 C) (05/04 1130) Pulse Rate:  [56-86] 86 (05/04 1130) Resp:  [15-22] 20 (05/04 1130) BP: (150-189)/(71-106) 158/85 (05/04 1130) SpO2:  [92 %-100 %] 99 % (05/04 1130)  General - obese, well developed, in no apparent distress.  Ophthalmologic - fundi not visualized due to noncooperation.  Cardiovascular - Regular rhythm and rate.  Mental Status -  Level of arousal and orientation to time, place, and person were intact. Language including expression, naming, repetition, comprehension was assessed and found intact. Fund of Knowledge was assessed and was intact.  Cranial Nerves II - XII - II - Visual field intact OU. III, IV, VI - Extraocular movements intact. V - Facial sensation intact bilaterally. VII - Facial movement intact bilaterally. VIII - Hearing & vestibular intact bilaterally. X - Palate elevates symmetrically. XI - Chin turning & shoulder shrug intact bilaterally. XII - Tongue protrusion intact.  Motor Strength - The patient's strength was normal in RUE and RLE, however, LUE proximal 4/5 and bicep 3/5, tricep 4/5 and finger grip 2/5. LLE proximal 4+/5 and distal foot DF PF 3/5.  Bulk was normal and fasciculations  were absent.   Motor Tone - Muscle tone was assessed at the neck and appendages and was normal.  Reflexes - The patient's reflexes were symmetrical in all extremities and she had no pathological reflexes.  Sensory - Light touch, temperature/pinprick were assessed and were decreased on the LUE, about 25% of the RUE, also decreased on the LLE, about 75% of the RLE.    Coordination - The patient had normal movements in the hands with no ataxia or dysmetria, but slow action on the left.  Tremor was absent.  Gait and Station - deferred.   ASSESSMENT/PLAN Beth [name] is a 79 y.o. female with history of HTN, memory, loss, seizures, stroke presenting with L sided  weakness, L sensory loss and L hemineglect.   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 ASPECTS 10.     CTA head & neck no LVO. R ICA origin plaque 55% stenosis.   MRI  Small R MCA infarct. Old large R temporal lobe encephalomalacia. Mild small vessel disease.   Carotid Doppler  pending  2D Echo pending   LDL 78  HgbA1c 6.0  SCDs for VTE prophylaxis  aspirin 81 mg daily prior to admission, now on aspirin 325 mg daily. Given the hx of SAH with aneurysm and no documentation of coiling or clipping, will recommend against DAPT at this time.   Therapy recommendations:  CIR  Disposition:  pending   R Carotid Stenosis  CTA head & neck  R ICA origin plaque 55% stenosis.   Carotid doppler pending   On aspirin 325  No DAPT given hx aneurysmal ICH without documentation of aneurysm secure  Vascular surgery consultation requested   Hx of SAH due to ?? aneurysm rupture  As per chart from Dr. Brett Fairy, pt had aneurysmal ICH in 2008  Developed seizure in 2009 and followed with Dr. Brett Fairy and doing well now with keppra 750 bid  CT head did not show any hx of craniotomy - ruled out clipping  CT head and CTA head and neck did not show aneurysm but there is a round hyperdensity at right temporal lobe, signal more like calcification and seems separate from the major vessel but can not rule out thrombosed aneurysm.  Given the uncertainty, will against DAPT at this time.   COPD  On home O2 24/7  Mild SOB during this hospitalization  Need anesthesia evaluation for perioperative procedure if needed  Management per primary team  Hypertensive Urgency  BP as high as 189/93 on arrival  BP 150-180s now . Permissive hypertension (OK if < 180/105) but gradually normalized in 5-7 days  . BP goal normotensive  Hyperlipidemia  Home meds:  lipitor 10  Now on lipitor 40  LDL 78, goal <  70  Continue statin at discharge  Other Stroke Risk Factors  Advanced age  Hx Cigarette smoker, quit 13 yrs ago  Morbid Obesity, Body mass index is 41.1 kg/m., recommend weight loss, diet and exercise as appropriate   Other Active Problems  Seizures, on keppra PTA, continued, followed by Dr. Brett Fairy at [facility]  Depression, anxiety on lexapro and klonopin  Baseline memory deficits - follwed by Dr. Brett Fairy at [facility]  Leukocytosis - WBC 12.5  Hyponatremia - [facility] day # 1  Beth Hawking, MD PhD Stroke Neurology 05/17/2019 1:33 PM  I spent  35 minutes in total face-to-face time with the patient,  more than 50% of which was spent in counseling and coordination of care, reviewing test results, images and medication, and discussing the diagnosis of stroke, hx of SAH, ? Unsecured aneurysm, carotid stenosis, seizure, treatment plan and potential prognosis. This patient's care requiresreview of multiple databases, neurological assessment, discussion with family, other specialists and medical decision making of high complexity.   To contact Stroke Continuity provider, please refer to [email or web address] After hours, contact General Neurology

## 2019-05-17 NOTE — Consult Note (Signed)
Physical Medicine and Rehabilitation Consult   Reason for Consult: stroke with functional deficits Referring Physician: Dr. Eliseo Squires   HPI: Beth [name] is a 79 y.o. 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 CT head negative. 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   MRI brain done revealing small acute R-MCA infarct involving frontoparietal region and chronic large right temporal lobe infarct. Dr. Erlinda Hong felt that stroke was embolic due to secondary to R-ICA stenosis and no DAPT as question that round hyperdensity at temporal lobe could be thrombosed aneurysm. On ASA for secondary stroke prevention.   [facility] consulted for input on ICA stenosis and Dr. Scot Dock reviewing case as patient potential candidate for stenting. She did develop A fib with RVR last night requiring IV metoprolol. Therapy evaluations completed revealing functional deficits due to left hemiparesis with sensory deficits and left inattention. CIR recommended due to functional decline.   Patient does not note any visual issues.  She does have some numbness around her mouth and left nostril.  Her husband has noted some spillage of food and drink from the left side of her mouth Review of Systems  Constitutional: Negative for chills and fever.  HENT: Negative for hearing loss and tinnitus.   Eyes: Negative for blurred vision and double vision.  Respiratory: Negative for cough and shortness of breath.   Cardiovascular: Positive for palpitations.  Gastrointestinal: Positive for constipation (due to rectocele). Negative for heartburn and nausea.  Genitourinary: Negative for dysuria and urgency.  Musculoskeletal: Positive for back pain, joint pain (right hip and left ankle) and myalgias.  Skin:  Negative for itching and rash.  Neurological: Positive for sensory change, speech change and focal weakness. Negative for dizziness and headaches.     Past Medical History:  Diagnosis Date  . BRCA2 positive 2017  . Diverticulosis   . Hearing loss   . HTN (hypertension)   . Memory loss   . Seizure disorder, complex partial, 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[place]) seizure free for one year  . Seizures ([place])   . 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   . Vestibulitis of ear     Past Surgical History:  Procedure Laterality Date  . broken ankle Right   . CEREBRAL ANEURYSM REPAIR    . TONSILLECTOMY      Family History  Problem Relation Age of Onset  . Dementia Mother   . Stroke Mother   . Thyroid disease Mother   . Osteoporosis Mother   . Ovarian cancer Paternal Grandmother 55  . Ovarian cancer Paternal Aunt 17  . Breast cancer Daughter 72       breast ca x2, bilateral dx. 38, 46--estrogen  . Other Daughter        no genetic testing as of 07/2015; has had ovaries removed  . Other Daughter        BRCA 2 Pos.  . Colon cancer Maternal Aunt        dx. unspecified age  . Emphysema Maternal Uncle   . High blood pressure Maternal Grandmother   . Dementia Maternal Grandfather   . Bleeding Disorder Maternal Grandfather   . Emphysema Maternal Aunt   . Emphysema Maternal Aunt     Social History:  Married. 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 She reports that she quit smoking about 13 years ago. She has a 41.00 pack-year smoking history. She has never used smokeless tobacco. She reports that she does not drink alcohol or use drugs.   Allergies  Allergen Reactions  . Prozac [Fluoxetine Hcl] Other (See Comments)    seizures  . Sulfa Antibiotics Nausea And Vomiting and Other (See Comments)    TINGLING IN LEGS ALSO  . Micardis [Telmisartan] Swelling and Other (See Comments)    Swelling, bruising Swelling, bruising  . Prednisone Other (See Comments)     mood changes Mood changes  . Lisinopril-Hydrochlorothiazide Other (See Comments)    unknown  . Augmentin [Amoxicillin-Pot Clavulanate] Rash  . Avelox [Moxifloxacin Hcl In Nacl] Rash    LEVAQUIN IS OK-SEE NOTE  . Norvasc [Amlodipine Besylate] Other (See Comments)    fatigue    Medications Prior to Admission  Medication Sig Dispense Refill  . albuterol (PROVENTIL HFA;VENTOLIN HFA) 108 (90 Base) MCG/ACT inhaler Inhale 2 puffs into the lungs every 4 (four) hours as needed for wheezing or shortness of breath. 1 Inhaler 5  . aspirin EC 81 MG tablet Take 81 mg by mouth daily.    Marland Kitchen atenolol (TENORMIN) 50 MG tablet Take 25 mg by mouth 2 (two) times daily.     . captopril (CAPOTEN) 50 MG tablet Take 50 mg by mouth 2 (two) times daily.    . Cholecalciferol (VITAMIN D3) 2000 units capsule 1 tablet by mouth daily    . clonazePAM (KLONOPIN) 2 MG tablet TAKE 1 TABLET AT BEDTIME (Patient taking differently: Take 1 mg by mouth daily. For seizure activity or insomnia dohmeier.) 90 tablet 0  . fluticasone (FLONASE) 50 MCG/ACT nasal spray Place 1 spray into the nose daily as needed for allergies. Spray twice daily as needed     . levETIRAcetam 750 MG TB3D Take 750 mg by mouth 2 (two) times daily. 180 tablet 0  . LEXAPRO 20 MG tablet Take 1 tablet (20 mg total) by mouth daily. Morning dosage 90 tablet 3  . simvastatin (ZOCOR) 10 MG tablet Take 10 mg by mouth at bedtime.    . Tiotropium Bromide-Olodaterol 2.5-2.5 MCG/ACT AERS Inhale 2 puffs into the lungs daily. 4 g 3  . triamterene-hydrochlorothiazide (MAXZIDE-25) 37.5-25 MG tablet Take 1 tablet by mouth daily.      Home: Home Living Family/patient expects to be discharged to:: Private residence Living Arrangements: Spouse/significant other Available Help at Discharge: Family, Available 24 hours/day Type of Home: House Home Access: Stairs to enter [employer] of Steps: 4 Entrance Stairs-Rails: Right Home Layout: One level Bathroom  Shower/Tub: Gaffer, Door [employer] Toilet: Handicapped height Home Equipment: Careers adviser History: Prior Function Level of Independence: Independent with assistive device(s) Comments: previously walked without cane or walker.  Did break ankle from past sz. Functional Status:  Mobility: Bed Mobility Overal bed mobility: Needs Assistance Bed Mobility: Supine to Sit Supine to sit: Mod assist, HOB elevated General bed mobility comments: cues to use LUE as gross assist and cues to remember where her LUE is when sitting in bed or on EOB. Transfers Overall transfer level: Needs assistance Transfers: Sit to/from Stand, Stand Pivot Transfers Sit to Stand: Mod assist Stand pivot transfers: Mod assist General transfer comment: Pt required cues to stand up straight when transferring. Pt wants to wear her sandels that do not have a back on them and they appear to slip on floor.  Instructed that yellow socks are  best option for now.      ADL: ADL Overall ADL's : Needs assistance/impaired Eating/Feeding: Sitting, Cueing for safety, Set up, Cueing for compensatory techinques Eating/Feeding Details (indicate cue type and reason): Pt with some pocketing and does not feel L side of face as much as right so reports spilling and food on that side of her mouth that she is unaware of. Grooming: Oral care, Wash/dry hands, Wash/dry face, Minimal assistance, Sitting Grooming Details (indicate cue type and reason): Pt with significant L neglect.  Cued to use LUE as a gross assist during grooming tasks.  Pt with no feeling in the LUE. Upper Body Bathing: Moderate assistance, Sitting, Cueing for compensatory techniques, Cueing for safety Lower Body Bathing: Maximal assistance, Cueing for compensatory techniques, Cueing for safety, Sit to/from stand Upper Body Dressing : Maximal assistance, Sitting, Cueing for compensatory techniques, Cueing for safety Lower Body Dressing: Maximal assistance,  Sit to/from stand, Cueing for compensatory techniques, Cueing for safety Toilet Transfer: Moderate assistance, Stand-pivot, BSC(to her strong side) Toileting- Clothing Manipulation and Hygiene: Moderate assistance, Sit to/from stand, Cueing for safety, Cueing for compensatory techniques Functional mobility during ADLs: Moderate assistance, Cueing for safety General ADL Comments: Pt very limited wtih adls due to L side weakness, L neglect, impulsivity and poor balance and ability to stand.  Cognition: Cognition Overall Cognitive Status: Impaired/Different from baseline Orientation Level: Oriented X4 Cognition Arousal/Alertness: Awake/alert Behavior During Therapy: Impulsive Overall Cognitive Status: Impaired/Different from baseline Area of Impairment: Attention, Safety/judgement, Awareness Current Attention Level: Selective Safety/Judgement: Decreased awareness of safety, Decreased awareness of deficits Awareness: Emergent General Comments: Pt overall does well but has some mild impulsivity, is hyperverbal and does not know the depth of her deficits.   Blood pressure (!) 158/85, pulse 86, temperature 98 F (36.7 C), temperature source Oral, resp. rate 20, height '5\' 4"'  (1.626 m), weight 108.6 kg, last menstrual period 01/14/1995, SpO2 99 %. Physical Exam  Nursing note and vitals reviewed. Constitutional: She is oriented to person, place, and time. She appears well-developed and well-nourished. Nasal cannula in place.  Eyes: Pupils are equal, round, and reactive to light. Conjunctivae and EOM are normal.  Visual fields are intact confrontation testing  Cardiovascular: An irregularly irregular rhythm present. Tachycardia present.  No murmur heard. Respiratory: Effort normal and breath sounds normal. No respiratory distress.  GI: Soft. Bowel sounds are normal. She exhibits distension. There is no abdominal tenderness.  Musculoskeletal:     Comments: 2+ edema left ankle with ecchymosis  laterally and tenderness with ROM.   Neurological: She is alert and oriented to person, place, and time.  Left facial weakness with minimal dysarthria. Left sided weakness with sensory deficits. Able to follow commands without difficulty.  Gait not tested Visual fields are intact confrontation testing Sensation reduced in the left upper extremity, absent to light touch cannot feel pinch but cannot localize Left lower extremity sensation intact to light touch Motor strength is 3 - in the left deltoid bicep tricep finger flexors and extensors 4 - left hip flexor knee extensor ankle dorsiflexor 5/5 right deltoid bicep tricep grip hip flexor knee extensor ankle dorsiflexor  Skin: Skin is warm and dry.  Psychiatric: She has a normal mood and affect. Her behavior is normal. Judgment and thought content normal.    Results for orders placed or performed during the hospital encounter of 05/16/19 (from the past 24 hour(s))  Urine rapid drug screen (hosp performed)     Status: None   Collection Time: 05/16/19  3:56  PM  Result Value Ref Range   Opiates NONE DETECTED NONE DETECTED   Cocaine NONE DETECTED NONE DETECTED   Benzodiazepines NONE DETECTED NONE DETECTED   Amphetamines NONE DETECTED NONE DETECTED   Tetrahydrocannabinol NONE DETECTED NONE DETECTED   Barbiturates NONE DETECTED NONE DETECTED  Urinalysis, Routine w reflex microscopic     Status: Abnormal   Collection Time: 05/16/19  3:56 PM  Result Value Ref Range   Color, Urine YELLOW YELLOW   APPearance CLEAR CLEAR   Specific Gravity, Urine >1.046 (H) 1.005 - 1.030   pH 6.0 5.0 - 8.0   Glucose, UA NEGATIVE NEGATIVE mg/dL   Hgb urine dipstick NEGATIVE NEGATIVE   Bilirubin Urine NEGATIVE NEGATIVE   Ketones, ur 5 (A) NEGATIVE mg/dL   Protein, ur NEGATIVE NEGATIVE mg/dL   Nitrite NEGATIVE NEGATIVE   Leukocytes,Ua TRACE (A) NEGATIVE   RBC / HPF 0-5 0 - 5 RBC/hpf   WBC, UA 0-5 0 - 5 WBC/hpf   Bacteria, UA NONE SEEN NONE SEEN   Squamous  Epithelial / LPF 0-5 0 - 5  Vitamin B12     Status: None   Collection Time: 05/16/19  6:39 PM  Result Value Ref Range   Vitamin B-12 280 180 - 914 pg/mL  Folate RBC     Status: None   Collection Time: 05/16/19  6:39 PM  Result Value Ref Range   Folate, Hemolysate 468.0 Not Estab. ng/mL   Hematocrit 40.3 34.0 - 46.6 %   Folate, RBC 1,161 >498 ng/mL  TSH     Status: None   Collection Time: 05/16/19  6:39 PM  Result Value Ref Range   TSH 1.737 0.350 - 4.500 uIU/mL  Hemoglobin A1c     Status: Abnormal   Collection Time: 05/17/19  3:46 AM  Result Value Ref Range   Hgb A1c MFr Bld 6.0 (H) 4.8 - 5.6 %   Mean Plasma Glucose 125.5 mg/dL  Lipid panel     Status: Abnormal   Collection Time: 05/17/19  3:46 AM  Result Value Ref Range   Cholesterol 151 0 - 200 mg/dL   Triglycerides 172 (H) <150 mg/dL   HDL 39 (L) >40 mg/dL   Total CHOL/HDL Ratio 3.9 RATIO   VLDL 34 0 - 40 mg/dL   LDL Cholesterol 78 0 - 99 mg/dL  CBC with Differential/Platelet     Status: Abnormal   Collection Time: 05/17/19  3:46 AM  Result Value Ref Range   WBC 12.5 (H) 4.0 - 10.5 K/uL   RBC 3.89 3.87 - 5.11 MIL/uL   Hemoglobin 12.9 12.0 - 15.0 g/dL   HCT 37.7 36.0 - 46.0 %   MCV 96.9 80.0 - 100.0 fL   MCH 33.2 26.0 - 34.0 pg   MCHC 34.2 30.0 - 36.0 g/dL   RDW 12.2 11.5 - 15.5 %   Platelets 247 150 - 400 K/uL   nRBC 0.0 0.0 - 0.2 %   Neutrophils Relative % 72 %   Neutro Abs 9.0 (H) 1.7 - 7.7 K/uL   Lymphocytes Relative 17 %   Lymphs Abs 2.2 0.7 - 4.0 K/uL   Monocytes Relative 9 %   Monocytes Absolute 1.2 (H) 0.1 - 1.0 K/uL   Eosinophils Relative 1 %   Eosinophils Absolute 0.1 0.0 - 0.5 K/uL   Basophils Relative 0 %   Basophils Absolute 0.0 0.0 - 0.1 K/uL   Immature Granulocytes 1 %   Abs Immature Granulocytes 0.06 0.00 - 0.07 K/uL  Basic metabolic  panel     Status: Abnormal   Collection Time: 05/17/19  3:46 AM  Result Value Ref Range   Sodium 133 (L) 135 - 145 mmol/L   Potassium 3.7 3.5 - 5.1 mmol/L    Chloride 100 98 - 111 mmol/L   CO2 26 22 - 32 mmol/L   Glucose, Bld 116 (H) 70 - 99 mg/dL   BUN 8 8 - 23 mg/dL   Creatinine, Ser 0.74 0.44 - 1.00 mg/dL   Calcium 8.8 (L) 8.9 - 10.3 mg/dL   GFR calc non Af Amer >60 >60 mL/min   GFR calc Af Amer >60 >60 mL/min   Anion gap 7 5 - 15   CT Code Stroke CTA Head W/WO contrast  Result Date: 05/16/2019 CLINICAL DATA:  Code stroke follow-up, left-sided weakness EXAM: CT ANGIOGRAPHY HEAD AND NECK TECHNIQUE: Multidetector CT imaging of the head and neck was performed using the standard protocol during bolus administration of intravenous contrast. Multiplanar CT image reconstructions and MIPs were obtained to evaluate the vascular anatomy. Carotid stenosis measurements (when applicable) are obtained utilizing NASCET criteria, using the distal internal carotid diameter as the denominator. CONTRAST:  63m OMNIPAQUE IOHEXOL 350 MG/ML SOLN COMPARISON:  None. FINDINGS: CTA NECK FINDINGS Aortic arch: Calcified and noncalcified plaque along the arch and patent great vessel origins. Right carotid system: Common carotid is patent with atherosclerotic wall thickening and partially retropharyngeal course. There is primarily calcified plaque at the bifurcation extending along the proximal internal carotid causing approximately 55% stenosis. Remainder of the cervical ICA is patent. There is high-grade stenosis at the external carotid origin. Left carotid system: Common carotid is patent with atherosclerotic wall thickening. Internal and external carotid arteries are patent. There is mild calcified plaque along the proximal ICA without measurable stenosis. Vertebral arteries: Patent.  No measurable stenosis. Skeleton: Degenerative changes of the cervical spine. Other neck: No mass or adenopathy. Upper chest: Emphysema. Review of the MIP images confirms the above findings CTA HEAD FINDINGS Anterior circulation: Intracranial internal carotid arteries patent with minimal calcified  plaque. Anterior and middle cerebral arteries are patent. Posterior circulation: Intracranial vertebral arteries basilar artery, and posterior cerebral arteries are patent. Venous sinuses: As permitted by contrast timing, patent. Review of the MIP images confirms the above findings IMPRESSION: No large vessel occlusion. Plaque at the right ICA origin causes approximately 55% stenosis. There is no measurable stenosis at the left ICA origin. Electronically Signed   By: PMacy MisM.D.   On: 05/16/2019 12:26   CT Code Stroke CTA Neck W/WO contrast  Result Date: 05/16/2019 CLINICAL DATA:  Code stroke follow-up, left-sided weakness EXAM: CT ANGIOGRAPHY HEAD AND NECK TECHNIQUE: Multidetector CT imaging of the head and neck was performed using the standard protocol during bolus administration of intravenous contrast. Multiplanar CT image reconstructions and MIPs were obtained to evaluate the vascular anatomy. Carotid stenosis measurements (when applicable) are obtained utilizing NASCET criteria, using the distal internal carotid diameter as the denominator. CONTRAST:  765mOMNIPAQUE IOHEXOL 350 MG/ML SOLN COMPARISON:  None. FINDINGS: CTA NECK FINDINGS Aortic arch: Calcified and noncalcified plaque along the arch and patent great vessel origins. Right carotid system: Common carotid is patent with atherosclerotic wall thickening and partially retropharyngeal course. There is primarily calcified plaque at the bifurcation extending along the proximal internal carotid causing approximately 55% stenosis. Remainder of the cervical ICA is patent. There is high-grade stenosis at the external carotid origin. Left carotid system: Common carotid is patent with atherosclerotic wall thickening. Internal and external  carotid arteries are patent. There is mild calcified plaque along the proximal ICA without measurable stenosis. Vertebral arteries: Patent.  No measurable stenosis. Skeleton: Degenerative changes of the cervical  spine. Other neck: No mass or adenopathy. Upper chest: Emphysema. Review of the MIP images confirms the above findings CTA HEAD FINDINGS Anterior circulation: Intracranial internal carotid arteries patent with minimal calcified plaque. Anterior and middle cerebral arteries are patent. Posterior circulation: Intracranial vertebral arteries basilar artery, and posterior cerebral arteries are patent. Venous sinuses: As permitted by contrast timing, patent. Review of the MIP images confirms the above findings IMPRESSION: No large vessel occlusion. Plaque at the right ICA origin causes approximately 55% stenosis. There is no measurable stenosis at the left ICA origin. Electronically Signed   By: Macy Mis M.D.   On: 05/16/2019 12:26   MR BRAIN WO CONTRAST  Result Date: 05/16/2019 CLINICAL DATA:  Suspected stroke. Left-sided weakness. EXAM: MRI HEAD WITHOUT CONTRAST TECHNIQUE: Multiplanar, multiecho pulse sequences of the brain and surrounding structures were obtained without intravenous contrast. COMPARISON:  Head CT 05/16/2019 and MRI 04/21/2017 FINDINGS: Brain: There is a small acute right MCA territory infarct primarily involving cortex in the frontoparietal region. The posterior aspect of the right insula is also involved. A large chronic infarct is again noted laterally in the right temporal lobe with a small amount of associated chronic blood products. Patchy T2 hyperintensities in the cerebral white matter bilaterally have mildly progressed from the prior MRI and are nonspecific but compatible with mildly age advanced chronic small vessel ischemic disease. There is mild global cerebral atrophy. No mass, midline shift, or extra-axial fluid collection is identified. Vascular: Major intracranial vascular flow voids are preserved. Skull and upper cervical spine: No suspicious marrow lesion. Moderate disc and facet degeneration in the cervical spine. Sinuses/Orbits: Bilateral cataract extraction. Unchanged left  maxillary sinus mucous retention cyst. Clear mastoid air cells. Other: None. IMPRESSION: 1. Small acute right MCA infarct. 2. Chronic right temporal lobe infarct. 3. Mild chronic small vessel ischemic disease. Electronically Signed   By: Logan Bores M.D.   On: 05/16/2019 14:42   CT HEAD CODE STROKE WO CONTRAST  Result Date: 05/16/2019 CLINICAL DATA:  Code stroke.  Left-sided weakness EXAM: CT HEAD WITHOUT CONTRAST TECHNIQUE: Contiguous axial images were obtained from the base of the skull through the vertex without intravenous contrast. COMPARISON:  None. FINDINGS: Brain: There is no acute intracranial hemorrhage or mass effect. No definite acute appearing loss of gray-white differentiation. Questionable loss of gray-white differentiation along the right postcentral gyrus is probably artifactual. Encephalomalacia is again identified within the right temporal lobe extending posterior superiorly to the atrium of the right lateral ventricle. Additional patchy hypoattenuation in the supratentorial white matter is nonspecific but probably reflects stable chronic microvascular ischemic changes. Ventricles are stable in size. Vascular: There is no hyperdense vessel. Intracranial atherosclerotic calcification is present at the skull base. Skull: Calvarium is unremarkable. Sinuses/Orbits: Left maxillary sinus retention cyst or polyp. No acute orbital finding. Other: Mastoid air cells are clear. ASPECTS ([place] Stroke Program Early CT Score) - Ganglionic level infarction (caudate, lentiform nuclei, internal capsule, insula, M1-M3 cortex): 7 - Supraganglionic infarction (M4-M6 cortex): 3 Total score (0-10 with 10 being normal): 10 IMPRESSION: No acute intracranial hemorrhage or evidence of acute infarction. ASPECT score is 10. These results were called by telephone at the time of interpretation on 05/16/2019 to provider Dr. Cheral Marker, who verbally acknowledged these results. Electronically Signed   By: Macy Mis M.D.    On:  05/16/2019 12:09     Assessment/Plan: Diagnosis: Right MCA infarct question cardioembolic versus ICA stenosis 1. Does the need for close, 24 hr/day medical supervision in concert with the patient's rehab needs make it unreasonable for this patient to be served in a less intensive setting? Yes 2. Co-Morbidities requiring supervision/potential complications: New onset atrial fibrillation with rapid ventricular response, morbid obesity, hypertension, right carotid stenosis 3. Due to bladder management, bowel management, safety, skin/wound care, disease management, medication administration, pain management and patient education, does the patient require 24 hr/day rehab nursing? Yes 4. Does the patient require coordinated care of a physician, rehab nurse, therapy disciplines of PT, OT, speech to address physical and functional deficits in the context of the above medical diagnosis(es)? Yes Addressing deficits in the following areas: balance, endurance, locomotion, strength, transferring, bowel/bladder control, bathing, dressing, feeding, toileting, cognition and psychosocial support 5. Can the patient actively participate in an intensive therapy program of at least 3 hrs of therapy per day at least 5 days per week? Yes 6. The potential for patient to make measurable gains while on inpatient rehab is excellent 7. Anticipated functional outcomes upon discharge from inpatient rehab are supervision  with PT, supervision with OT, supervision with SLP. 8. Estimated rehab length of stay to reach the above functional goals is: 16-20d 9. Anticipated discharge destination: Home 10. Overall Rehab/Functional Prognosis: excellent  RECOMMENDATIONS: This patient's condition is appropriate for continued rehabilitative care in the following setting: CIR Patient has agreed to participate in recommended program. Yes Note that insurance prior authorization may be required for reimbursement for recommended  care.  Comment: Not ready today, new onset atrial fibrillation with rapid ventricular response will need to establish plan for this.  Also need to establish whether carotid endarterectomy will be performed urgently or whether this may wait until after inpatient rehabilitation  Bary Leriche, PA-C 05/17/2019   "I have personally performed a face to face diagnostic evaluation of this patient.  Additionally, I have reviewed and concur with the physician assistant's documentation above."  Charlett Blake M.D. [facility] FAAPM&R (Neuromuscular Med) Diplomate Am Board of Electrodiagnostic Med Fellow Am Board of Interventional Pain

## 2019-05-17 NOTE — TOC Initial Note (Signed)
Transition of Care [facility]) - Initial/Assessment Note    Patient Details  Name: Beth [name] MRN: [id number] Date of Birth: 09/13/1940  Transition of Care [facility]) CM/SW Contact:    Pollie Friar, RN Phone Number: 05/17/2019, 1:57 PM  Clinical Narrative:                 Pt denies issues with home medications. Her spouse is able to provide needed transportation. Per pt he is able to provide 24 hour supervision. OT recommending CIR.  TOC following for d/c needs.   Expected Discharge Plan: IP Rehab Facility Barriers to Discharge: Continued Medical Work up   Patient Goals and CMS Choice     Choice offered to / list presented to : Patient, Spouse  Expected Discharge Plan and Services Expected Discharge Plan: [place]   Discharge Planning Services: CM Consult Post Acute Care Choice: IP Rehab Living arrangements for the past 2 months: Single Family Home                                      Prior Living Arrangements/Services Living arrangements for the past 2 months: Single Family Home Lives with:: Spouse Patient language and need for interpreter reviewed:: Yes Do you feel safe going back to the place where you live?: Yes      Need for Family Participation in Patient Care: Yes (Comment) Care giver support system in place?: Yes (comment)(spouse) Current home services: DME(wheelchair/ shower seat) Criminal Activity/Legal Involvement Pertinent to Current Situation/Hospitalization: No - Comment as needed  Activities of Daily Living Home Assistive Devices/Equipment: Eyeglasses ADL Screening (condition at time of admission) Patient's cognitive ability adequate to safely complete daily activities?: Yes Is the patient deaf or have difficulty hearing?: No Does the patient have difficulty seeing, even when wearing glasses/contacts?: Yes Does the patient have difficulty concentrating, remembering, or making decisions?: Yes Patient able to express need for assistance with  ADLs?: Yes Does the patient have difficulty dressing or bathing?: No Independently performs ADLs?: No Communication: Independent Dressing (OT): Needs assistance Is this a change from baseline?: Change from baseline, expected to last <3days Grooming: Needs assistance Is this a change from baseline?: Change from baseline, expected to last <3 days Feeding: Independent Bathing: Needs assistance Is this a change from baseline?: Change from baseline, expected to last <3 days Toileting: Independent In/Out Bed: Needs assistance Is this a change from baseline?: Change from baseline, expected to last <3 days Walks in Home: Needs assistance Is this a change from baseline?: Change from baseline, expected to last <3 days Does the patient have difficulty walking or climbing stairs?: Yes Weakness of Legs: Both Weakness of Arms/Hands: None  Permission Sought/Granted                  Emotional Assessment Appearance:: Appears stated age Attitude/Demeanor/Rapport: Engaged Affect (typically observed): Accepting Orientation: : Oriented to Self, Oriented to Place, Oriented to  Time, Oriented to Situation   Psych Involvement: No (comment)  Admission diagnosis:  CVA (cerebral vascular accident) ([place]) [I63.9] Acute left-sided weakness [R53.1] Cerebrovascular accident (CVA), unspecified mechanism ([place]) [I63.9] Patient Active Problem List   Diagnosis Date Noted  . Seizures ([place])   . HLD (hyperlipidemia)   . Macrocytosis   . CVA (cerebral vascular accident) ([place])   . Depression   . Allergic rhinitis 05/02/2019  . At high risk for breast cancer 12/09/2017  . Amnestic MCI (mild  cognitive impairment with memory loss) 04/13/2017  . Obesity, morbid, BMI 40.0-49.9 ([place]) 04/30/2016  . Chronic respiratory failure ([place]) 12/11/2015  . COPD without exacerbation ([place]) 10/08/2015  . Genetic testing 08/26/2015  . 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  . Family history of ovarian cancer 07/26/2015  . 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 Benign paroxysmal positional vertigo 04/06/2014  . Tonic-clonic seizure syndrome ([place]) 04/06/2014  . Pulsatile tinnitus of right ear 04/06/2014  . Other forms of epilepsy and recurrent seizures without mention of intractable epilepsy 04/21/2013  . SAH (subarachnoid hemorrhage) ([place]) 04/21/2013  . 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 Seizure disorder, complex partial, 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[place])   . Stroke, hemorrhagic ([place])   . HTN (hypertension)   . Vestibulitis of ear    PCP:  Kathyrn Lass, MD Pharmacy:   [facility] - [address] [place] [address] Phone: [phone] Fax: [phone]  [facility], [place] [address] [place] [address] Phone: [phone] Fax: [phone]  [facility] [address] [place], [place]. [place] [address] Phone: [phone] Fax: [place], [address] AT [place] [address] Phone: [phone] Fax: [phone]     Social Determinants of Health (SDOH) Interventions    Readmission Risk Interventions No flowsheet data found.

## 2019-05-18 ENCOUNTER — Inpatient Hospital Stay (HOSPITAL_COMMUNITY): Payer: Medicare Other

## 2019-05-18 DIAGNOSIS — I4891 Unspecified atrial fibrillation: Secondary | ICD-10-CM

## 2019-05-18 DIAGNOSIS — D7589 Other specified diseases of blood and blood-forming organs: Secondary | ICD-10-CM

## 2019-05-18 DIAGNOSIS — I6300 Cerebral infarction due to thrombosis of unspecified precerebral artery: Secondary | ICD-10-CM | POA: Diagnosis not present

## 2019-05-18 DIAGNOSIS — I6521 Occlusion and stenosis of right carotid artery: Secondary | ICD-10-CM

## 2019-05-18 DIAGNOSIS — J449 Chronic obstructive pulmonary disease, unspecified: Secondary | ICD-10-CM | POA: Diagnosis not present

## 2019-05-18 DIAGNOSIS — R531 Weakness: Secondary | ICD-10-CM

## 2019-05-18 LAB — CBC
HCT: 41.7 % (ref 36.0–46.0)
Hemoglobin: 14 g/dL (ref 12.0–15.0)
MCH: 32.6 pg (ref 26.0–34.0)
MCHC: 33.6 g/dL (ref 30.0–36.0)
MCV: 97.2 fL (ref 80.0–100.0)
Platelets: 248 10*3/uL (ref 150–400)
RBC: 4.29 MIL/uL (ref 3.87–5.11)
RDW: 12.3 % (ref 11.5–15.5)
WBC: 10.5 10*3/uL (ref 4.0–10.5)
nRBC: 0 % (ref 0.0–0.2)

## 2019-05-18 LAB — BASIC METABOLIC PANEL
Anion gap: 9 (ref 5–15)
BUN: 7 mg/dL — ABNORMAL LOW (ref 8–23)
CO2: 26 mmol/L (ref 22–32)
Calcium: 9 mg/dL (ref 8.9–10.3)
Chloride: 103 mmol/L (ref 98–111)
Creatinine, Ser: 0.66 mg/dL (ref 0.44–1.00)
GFR calc Af Amer: >60 mL/min (ref >60)
GFR calc non Af Amer: >60 mL/min (ref >60)
Glucose, Bld: 140 mg/dL — ABNORMAL HIGH (ref 70–99)
Potassium: 3.7 mmol/L (ref 3.5–5.1)
Sodium: 138 mmol/L (ref 135–145)

## 2019-05-18 MED ORDER — METOPROLOL TARTRATE 5 MG/5ML IV SOLN
5.0000 mg | Freq: Four times a day (QID) | INTRAVENOUS | Status: DC | PRN
  Administered 2019-05-18: 5 mg via INTRAVENOUS
  Filled 2019-05-18: qty 5

## 2019-05-18 MED ORDER — DILTIAZEM HCL-DEXTROSE 125-5 MG/125ML-% IV SOLN (PREMIX)
5.0000 mg/h | INTRAVENOUS | Status: DC
  Administered 2019-05-18: 20 mg/h via INTRAVENOUS
  Administered 2019-05-18: 5 mg/h via INTRAVENOUS
  Administered 2019-05-19 (×3): 15 mg/h via INTRAVENOUS
  Filled 2019-05-18 (×7): qty 125

## 2019-05-18 MED ORDER — DILTIAZEM LOAD VIA INFUSION
10.0000 mg | Freq: Once | INTRAVENOUS | Status: AC
  Administered 2019-05-18: 10 mg via INTRAVENOUS
  Filled 2019-05-18: qty 10

## 2019-05-18 MED ORDER — DILTIAZEM HCL 25 MG/5ML IV SOLN
15.0000 mg | Freq: Once | INTRAVENOUS | Status: AC
  Administered 2019-05-18: 15 mg via INTRAVENOUS
  Filled 2019-05-18: qty 5

## 2019-05-18 NOTE — Progress Notes (Signed)
[facility] PROGRESS NOTE    Progress Note  Beth [name]  [id number] DOB: 14-Mar-1940 DOA: 05/16/2019 PCP: Kathyrn Lass, MD     Brief Narrative:   [name] is an 79 y.o. female past medical history significant for hypertension, depression aneurysmal intracranial hemorrhage in 2008, seizure disorder secondary to [place] chronic respiratory failure on 2 L of oxygen secondary to COPD comes in for left-sided weakness found to have a stroke  Assessment/Plan:   CVA (cerebral vascular accident) [facility]) With a history of aneurysmal intracranial hemorrhage in 2008. MRI of the brain showed right MCA infarct likely embolic to RCA proximal stenosis echocardiogram 55% Wall motion could not be delineated Carotid Doppler results are pending. Continue aspirin and statins. Physical therapy and Occupational Therapy saw the patient recommended CIR, Speech evaluation is pending. Currently on aspirin 325, neurology recommended against dual antiplatelet therapy. Neurology consulted vascular surgery who is awaiting further carotid duplex as there could be a potential that she is a candidate for carotid stenting carotid duplex is pending.    New onset atrial fibrillation: We will start on IV diltiazem this morning, will add IV metoprolol as needed. Question if this is the cause of her CVA we will discuss with neurology if she is a candidate for anticoagulation as she has a history of a subarachnoid hemorrhage.  Seizure disorder: Continue Keppra.  Essential hypertension: Stable holding home meds atenolol captopril triamterene/hydrochlorothiazide for now allow permissive hypertension.  Hyperlipidemia: Continue statin.  Depression/anxiety: Continue Lexapro and Klonopin  Chronic respiratory failure on 2 L of oxygen due to COPD: Stable.  Low normal B12 continue oral replacement.      DVT prophylaxis: lovenox Family Communication:husband Status is: Inpatient  Remains inpatient  appropriate because:Hemodynamically unstable   Dispo: The patient is from: Home              Anticipated d/c is to: SNF              Anticipated d/c date is: 3 days              Patient currently is not medically stable to d/c.  Code Status:     Code Status Orders  (From admission, onward)         Start     Ordered   05/16/19 1301  Full code  Continuous     05/16/19 1301        Code Status History    This patient has a current code status but no historical code status.   Advance Care Planning Activity    Advance Directive Documentation     Most Recent Value  Type of Advance Directive  Healthcare Power of Attorney  Pre-existing out of facility DNR order (yellow form or pink MOST form)  --  "MOST" Form in Place?  --        IV Access:    Peripheral IV   Procedures and diagnostic studies:   MR BRAIN WO CONTRAST  Result Date: 05/16/2019 CLINICAL DATA:  Suspected stroke. Left-sided weakness. EXAM: MRI HEAD WITHOUT CONTRAST TECHNIQUE: Multiplanar, multiecho pulse sequences of the brain and surrounding structures were obtained without intravenous contrast. COMPARISON:  Head CT 05/16/2019 and MRI 04/21/2017 FINDINGS: Brain: There is a small acute right MCA territory infarct primarily involving cortex in the frontoparietal region. The posterior aspect of the right insula is also involved. A large chronic infarct is again noted laterally in the right temporal lobe with a small amount of associated chronic blood products.  Patchy T2 hyperintensities in the cerebral white matter bilaterally have mildly progressed from the prior MRI and are nonspecific but compatible with mildly age advanced chronic small vessel ischemic disease. There is mild global cerebral atrophy. No mass, midline shift, or extra-axial fluid collection is identified. Vascular: Major intracranial vascular flow voids are preserved. Skull and upper cervical spine: No suspicious marrow lesion. Moderate disc and facet  degeneration in the cervical spine. Sinuses/Orbits: Bilateral cataract extraction. Unchanged left maxillary sinus mucous retention cyst. Clear mastoid air cells. Other: None. IMPRESSION: 1. Small acute right MCA infarct. 2. Chronic right temporal lobe infarct. 3. Mild chronic small vessel ischemic disease. Electronically Signed   By: Logan Bores M.D.   On: 05/16/2019 14:42   ECHOCARDIOGRAM COMPLETE  Result Date: 05/17/2019    ECHOCARDIOGRAM REPORT   Patient Name:   Beth [name] Date of Exam: 05/17/2019 Medical Rec #:  [id number]      Height:       64.0 in Accession #:    [id number]     Weight:       239.4 lb Date of Birth:  [birth year]       BSA:          2.112 m Patient Age:    65 years       BP:           158/85 mmHg Patient Gender: F              HR:           86 bpm. Exam Location:  Inpatient Procedure: 2D Echo Indications:    stroke 434.91  History:        Patient has prior history of Echocardiogram examinations, most                 recent 11/04/2018. Stroke; Risk Factors:Hypertension and Former                 Smoker.  Sonographer:    Jannett Celestine RDCS (AE) Referring Phys: [id number] M[place]in[place] Jewel  Sonographer Comments: Suboptimal parasternal window. Image acquisition challenging due to patient body habitus and Image acquisition challenging due to respiratory motion. limited mobility (left side non-mobile) IMPRESSIONS  1. Left ventricular ejection fraction, by estimation, is 55 to 60%. The left ventricle has normal function. Left ventricular endocardial border not optimally defined to evaluate regional wall motion. Left ventricular diastolic function could not be evaluated.  2. Right ventricular systolic function was not well visualized. The right ventricular size is not well visualized.  3. The mitral valve is grossly normal. Mild mitral valve regurgitation.  4. The aortic valve was not well visualized. Aortic valve regurgitation is not visualized. No aortic stenosis is present.  5. Pulmonic valve  regurgitation not well visualized. Conclusion(s)/Recommendation(s): No intracardiac source of embolism detected on this transthoracic study. A transesophageal echocardiogram is recommended to exclude cardiac source of embolism if clinically indicated. Technically challenging study. Recommend use of echo contrast for future studies. Prominent pericardial fat pad but only very small effusion. LV function globally appears normal, but cannot exclude small focal wall motion abnormalities. FINDINGS  Left Ventricle: Left ventricular ejection fraction, by estimation, is 55 to 60%. The left ventricle has normal function. Left ventricular endocardial border not optimally defined to evaluate regional wall motion. The left ventricular internal cavity size was normal in size. There is no left ventricular hypertrophy. Left ventricular diastolic function could not be evaluated. Right Ventricle: The right ventricular size is not well visualized.  Right vetricular wall thickness was not assessed. Right ventricular systolic function was not well visualized. Left Atrium: Left atrial size was not well visualized. Right Atrium: Right atrial size was not well visualized. Pericardium: A small pericardial effusion is present. Presence of pericardial fat pad. Mitral Valve: The mitral valve is grossly normal. Mild mitral annular calcification. Mild mitral valve regurgitation. Tricuspid Valve: The tricuspid valve is grossly normal. Tricuspid valve regurgitation is trivial. No evidence of tricuspid stenosis. Aortic Valve: The aortic valve was not well visualized. Aortic valve regurgitation is not visualized. No aortic stenosis is present. Pulmonic Valve: The pulmonic valve was not well visualized. Pulmonic valve regurgitation not well visualized. Aorta: The aortic root was not well visualized. Venous: The inferior vena cava was not well visualized. IAS/Shunts: The interatrial septum was not well visualized.  AORTIC VALVE LVOT Vmax:   79.20 cm/s  LVOT Vmean:  50.200 cm/s LVOT VTI:    0.132 m MITRAL VALVE MV Area (PHT): 3.42 cm    SHUNTS MV Decel Time: 222 msec    Systemic VTI: 0.13 m MV E velocity: 85.70 cm/s Buford Dresser MD Electronically signed by Buford Dresser MD Signature Date/Time: 05/17/2019/7:25:16 PM    Final      Medical Consultants:    None.  Anti-Infectives:   none  Subjective:    [name] denies any chest pain or shortness of breath, she relates she is hungry.  Objective:    Vitals:   05/18/19 1000 05/18/19 1050 05/18/19 1120 05/18/19 1204  BP:  (!) 138/98 (!) 149/73 (!) 145/96  Pulse:    (!) 109  Resp: 18 18 20 18   Temp:    97.8 F (36.6 C)  TempSrc:    Oral  SpO2: 94% 97% 97% 96%  Weight:      Height:       SpO2: 96 % O2 Flow Rate (L/min): 2 L/min   Intake/Output Summary (Last 24 hours) at 05/18/2019 1326 Last data filed at 05/18/2019 0350 Gross per 24 hour  Intake --  Output 1650 ml  Net -1650 ml   Filed Weights   05/16/19 1142  Weight: 108.6 kg    Exam: General exam: In no acute distress. Respiratory system: Good air movement and clear to auscultation. Cardiovascular system: S1 & S2 heard, RRR.  Gastrointestinal system: Abdomen is nondistended, soft and nontender.  Central nervous system: Alert and oriented.  Left-sided weakness upper and lower extremity. Extremities: No pedal edema. Skin: No rashes, lesions or ulcers Psychiatry: Judgement and insight appear normal. Mood & affect appropriate.    Data Reviewed:    Labs: Basic Metabolic Panel: Recent Labs  Lab 05/16/19 1030 05/16/19 1030 05/16/19 1134 05/16/19 1134 05/17/19 0346 05/18/19 0503  NA 137  --  138  --  133* 138  K 3.9   < > 3.8   < > 3.7 3.7  CL 102  --  100  --  100 103  CO2 24  --   --   --  26 26  GLUCOSE 155*  --  147*  --  116* 140*  BUN 9  --  11  --  8 7*  CREATININE 0.79  --  0.70  --  0.74 0.66  CALCIUM 8.9  --   --   --  8.8* 9.0   < > = values in this interval not displayed.    GFR Estimated Creatinine Clearance: 70.9 mL/min (by C-G formula based on SCr of 0.66 mg/dL). Liver Function Tests: Recent  Labs  Lab 05/16/19 1030  AST 22  ALT 18  ALKPHOS 39  BILITOT 0.4  PROT 7.0  ALBUMIN 3.6   No results for input(s): LIPASE, AMYLASE in the last 168 hours. No results for input(s): AMMONIA in the last 168 hours. Coagulation profile Recent Labs  Lab 05/16/19 1030  INR 0.9   COVID-19 Labs  No results for input(s): DDIMER, FERRITIN, LDH, CRP in the last 72 hours.  Lab Results  Component Value Date   [place] NEGATIVE 05/16/2019    CBC: Recent Labs  Lab 05/16/19 1030 05/16/19 1134 05/16/19 1839 05/17/19 0346 05/18/19 0503  WBC 10.2  --   --  12.5* 10.5  NEUTROABS 6.6  --   --  9.0*  --   HGB 14.2 14.6  --  12.9 14.0  HCT 43.4 43.0 40.3 37.7 41.7  MCV 100.9*  --   --  96.9 97.2  PLT 274  --   --  247 248   Cardiac Enzymes: No results for input(s): CKTOTAL, CKMB, CKMBINDEX, TROPONINI in the last 168 hours. BNP (last 3 results) No results for input(s): PROBNP in the last 8760 hours. CBG: Recent Labs  Lab 05/16/19 1128  GLUCAP 158*   D-Dimer: No results for input(s): DDIMER in the last 72 hours. Hgb A1c: Recent Labs    05/17/19 0346  HGBA1C 6.0*   Lipid Profile: Recent Labs    05/17/19 0346  CHOL 151  HDL 39*  LDLCALC 78  TRIG 172*  CHOLHDL 3.9   Thyroid function studies: Recent Labs    05/16/19 1839  TSH 1.737   Anemia work up: Recent Labs    05/16/19 1839  VITAMINB12 280   Sepsis Labs: Recent Labs  Lab 05/16/19 1030 05/17/19 0346 05/18/19 0503  WBC 10.2 12.5* 10.5   Microbiology Recent Results (from the past 240 hour(s))  Respiratory Panel by RT PCR (Flu A&B, Covid) - Nasopharyngeal Swab     Status: None   Collection Time: 05/16/19  2:25 PM   Specimen: Nasopharyngeal Swab  Result Value Ref Range Status   SARS Coronavirus 2 by RT PCR NEGATIVE NEGATIVE Final    Comment: (NOTE) SARS-CoV-2 target  nucleic acids are NOT DETECTED. The SARS-CoV-2 RNA is generally detectable in upper respiratoy specimens during the acute phase of infection. The lowest concentration of SARS-CoV-2 viral copies this assay can detect is 131 copies/mL. A negative result does not preclude SARS-Cov-2 infection and should not be used as the sole basis for treatment or other patient management decisions. A negative result may occur with  improper specimen collection/handling, submission of specimen other than nasopharyngeal swab, presence of viral mutation(s) within the areas targeted by this assay, and inadequate number of viral copies (<131 copies/mL). A negative result must be combined with clinical observations, patient history, and epidemiological information. The expected result is Negative. Fact Sheet for Patients:  PinkCheek.be Fact Sheet for Healthcare Providers:  GravelBags.it This test is not yet ap proved or cleared by the [place] FDA and  has been authorized for detection and/or diagnosis of SARS-CoV-2 by FDA under an Emergency Use Authorization (EUA). This EUA will remain  in effect (meaning this test can be used) for the duration of the COVID-19 declaration under Section 564(b)(1) of the Act, 21 U.S.C. section 360bbb-3(b)(1), unless the authorization is terminated or revoked sooner.    Influenza A by PCR NEGATIVE NEGATIVE Final   Influenza B by PCR NEGATIVE NEGATIVE Final    Comment: (NOTE) The Xpert Xpress SARS-CoV-2/FLU/RSV  assay is intended as an aid in  the diagnosis of influenza from Nasopharyngeal swab specimens and  should not be used as a sole basis for treatment. Nasal washings and  aspirates are unacceptable for Xpert Xpress SARS-CoV-2/FLU/RSV  testing. Fact Sheet for Patients: PinkCheek.be Fact Sheet for Healthcare Providers: GravelBags.it This test is not  yet approved or cleared by the [place] FDA and  has been authorized for detection and/or diagnosis of SARS-CoV-2 by  FDA under an Emergency Use Authorization (EUA). This EUA will remain  in effect (meaning this test can be used) for the duration of the  Covid-19 declaration under Section 564(b)(1) of the Act, 21  U.S.C. section 360bbb-3(b)(1), unless the authorization is  terminated or revoked. Performed at [facility], [place] [address] [place], [place] [address]      Medications:    aspirin EC  325 mg Oral Daily   atorvastatin  40 mg Oral Daily   cholecalciferol  2,000 Units Oral Daily   clonazePAM  2 mg Oral QHS   diclofenac Sodium  2 g Topical QID   escitalopram  20 mg Oral Daily   levETIRAcetam  750 mg Oral BID   vitamin B-12  500 mcg Oral Daily   Continuous Infusions:  diltiazem (CARDIZEM) infusion 10 mg/hr (05/18/19 1109)      LOS: 2 days   Charlynne Cousins  [facility]  05/18/2019, 1:26 PM

## 2019-05-18 NOTE — Progress Notes (Signed)
   05/17/19 2041  Assess: MEWS Score  Temp 98.4 F (36.9 C)  BP (!) 170/94  Pulse Rate 95  ECG Heart Rate (!) 144  Resp 19  Level of Consciousness Alert  SpO2 98 %  O2 Device Nasal Cannula  O2 Flow Rate (L/min) 2 L/min  Assess: MEWS Score  MEWS Temp 0  MEWS Systolic 0  MEWS Pulse 3  MEWS RR 0  MEWS LOC 0  MEWS Score 3  MEWS Score Color Yellow  Assess: if the MEWS score is Yellow or Red  Were vital signs taken at a resting state? Yes  Focused Assessment Documented focused assessment  Early Detection of Sepsis Score *See Row Information* Low  MEWS guidelines implemented *See Row Information* Yes  Treat  MEWS Interventions Administered prn meds/treatments  Take Vital Signs  Increase Vital Sign Frequency  Yellow: Q 2hr X 2 then Q 4hr X 2, if remains yellow, continue Q 4hrs  Escalate  MEWS: Escalate Yellow: discuss with charge nurse/RN and consider discussing with provider and RRT  Notify: Charge Nurse/RN  Name of Charge Nurse/RN Notified Phil RN  Date Charge Nurse/RN Notified 05/17/19  Time Charge Nurse/RN Notified 2000  Notify: Provider  Provider Name/Title Opyd MD  Date Provider Notified 05/17/19  Time Provider Notified 2015  Notification Type Call  Notification Reason Change in status  Response See new orders  Date of Provider Response 05/17/19  Time of Provider Response 2031  Notify: Rapid Response  Name of Rapid Response RN Notified Mindy RN  Date Rapid Response Notified 05/17/19  Time Rapid Response Notified 2045  Document  Patient Outcome Stabilized after interventions

## 2019-05-18 NOTE — Progress Notes (Signed)
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  Peak systolic velocity is 0000000 cm/s with an end-diastolic velocity of 61 cm/s.  Given that the echo was negative I think this is most likely the cause for the right MCA stroke.  She is not a candidate for carotid endarterectomy given that her bifurcation is very high.  However I have reviewed the films with Dr. Donzetta Matters, and he feels that she may be a candidate for transcarotid stenting (TCAR).  She would need to be on dual antiplatelet therapy for this procedure which I think would be reasonable given that her intracranial hemorrhage was back in 2008.  I will discuss the timing of the procedure with Dr. Donzetta Matters.  [name], MD Office: [phone]

## 2019-05-18 NOTE — Progress Notes (Signed)
Carotid artery duplex completed. Refer to "CV Proc" under chart review to view preliminary results.  Preliminary results discussed with Dr. Scot Dock.  05/18/2019 2:30 PM Kelby Aline., MHA, RVT, RDCS, RDMS

## 2019-05-18 NOTE — Progress Notes (Addendum)
7STROKE TEAM PROGRESS NOTE   INTERVAL HISTORY Pt lying in bed, husband at bedside along with vascular technologist. Pt developed afib RVR and currently on cardizem drip. CUS showed right ICA 60-79% stenosis. Not candidate for CEA but likely able to do carotid stent.  I further questioned her and her husband about ICH at 2008. She stated that she was in her cardiology's office for stress test and she mentioned to her cardiologist about memory difficulty and intermittent dizziness. She was sent for an outpt MRI with and without contrast. Days later, she got call to see that she had brain bleed will send her to see neurologist outpt. She then followed with Dr. Roxy Horseman at [facility] who reviewed her outpt images and told her she had aneurysm with SAH. She denies any procedure or surgery for the brain bleed. Current CTA head and neck did not show any aneurysm or residue aneurysm or any evidence of prior intervention.  Vitals:   05/18/19 1120 05/18/19 1204 05/18/19 1220 05/18/19 1340  BP: (!) 149/73 (!) 145/96 140/89   Pulse:  (!) 109    Resp: 20 18 17 19   Temp:  97.8 F (36.6 C)    TempSrc:  Oral    SpO2: 97% 96% 96% 95%  Weight:      Height:        CBC:  Recent Labs  Lab 05/16/19 1030 05/16/19 1134 05/17/19 0346 05/18/19 0503  WBC 10.2   < > 12.5* 10.5  NEUTROABS 6.6  --  9.0*  --   HGB 14.2   < > 12.9 14.0  HCT 43.4   < > 37.7 41.7  MCV 100.9*   < > 96.9 97.2  PLT 274   < > 247 248   < > = values in this interval not displayed.    Basic Metabolic Panel:  Recent Labs  Lab 05/17/19 0346 05/18/19 0503  NA 133* 138  K 3.7 3.7  CL 100 103  CO2 26 26  GLUCOSE 116* 140*  BUN 8 7*  CREATININE 0.74 0.66  CALCIUM 8.8* 9.0   Lipid Panel:     Component Value Date/Time   CHOL 151 05/17/2019 0346   TRIG 172 (H) 05/17/2019 0346   HDL 39 (L) 05/17/2019 0346   CHOLHDL 3.9 05/17/2019 0346   VLDL 34 05/17/2019 0346   LDLCALC 78 05/17/2019 0346   HgbA1c:  Lab Results  Component Value  Date   HGBA1C 6.0 (H) 05/17/2019   Urine Drug Screen:     Component Value Date/Time   LABOPIA NONE DETECTED 05/16/2019 1556   COCAINSCRNUR NONE DETECTED 05/16/2019 1556   LABBENZ NONE DETECTED 05/16/2019 1556   AMPHETMU NONE DETECTED 05/16/2019 1556   THCU NONE DETECTED 05/16/2019 1556   LABBARB NONE DETECTED 05/16/2019 1556    Alcohol Level     Component Value Date/Time   ETH <10 05/16/2019 1030    IMAGING past 24 hours VAS US CAROTID  Result Date: 05/18/2019 Carotid Arterial Duplex Study Indications:                            CVA. Limitations                             Today's exam was limited due to the high  bifurcation of the carotid, heavy                                         calcification and the resulting                                         shadowing and the patient's respiratory                                         variation. Comparison Study:                       No prior study Pre-Surgical Evaluation & Surgical      Stenosis at RICA only. Right bifurcation Correlation                             is located near the mandible. Performing Technologist: Maudry Mayhew MHA, RDMS, RVT, RDCS  Examination Guidelines: A complete evaluation includes B-mode imaging, spectral Doppler, color Doppler, and power Doppler as needed of all accessible portions of each vessel. Bilateral testing is considered an integral part of a complete examination. Limited examinations for reoccurring indications may be performed as noted.  Right Carotid Findings: +----------+--------+-------+--------+------------------------+----------------+           PSV cm/sEDV    StenosisPlaque Description      Comments                           cm/s                                                    +----------+--------+-------+--------+------------------------+----------------+ CCA Prox  44      10                                                       +----------+--------+-------+--------+------------------------+----------------+ CCA Distal32      9                                                       +----------+--------+-------+--------+------------------------+----------------+ ICA Prox  324     61     60-79%  heterogenous and        high bifurcation                                  calcific                                 +----------+--------+-------+--------+------------------------+----------------+ ICA Distal82  22                                     tortuous         +----------+--------+-------+--------+------------------------+----------------+ ECA       199     43             calcific                                 +----------+--------+-------+--------+------------------------+----------------+ +----------+--------+-------+----------------+-------------------+           PSV cm/sEDV cmsDescribe        Arm Pressure (mmHG) +----------+--------+-------+----------------+-------------------+ Subclavian101            Multiphasic, WNL                    +----------+--------+-------+----------------+-------------------+ +---------+--------+--+--------+-+---------+ VertebralPSV cm/s24EDV cm/s6Antegrade +---------+--------+--+--------+-+---------+  Left Carotid Findings: +----------+--------+--------+--------+-------------------------+--------+           PSV cm/sEDV cm/sStenosisPlaque Description       Comments +----------+--------+--------+--------+-------------------------+--------+ CCA Prox  103     19                                                +----------+--------+--------+--------+-------------------------+--------+ CCA Distal83      21              calcific                          +----------+--------+--------+--------+-------------------------+--------+ ICA Prox  56      16              heterogenous and calcific          +----------+--------+--------+--------+-------------------------+--------+ ICA Distal51      13                                                +----------+--------+--------+--------+-------------------------+--------+ ECA       112     20                                                +----------+--------+--------+--------+-------------------------+--------+ +----------+--------+--------+----------------+-------------------+           PSV cm/sEDV cm/sDescribe        Arm Pressure (mmHG) +----------+--------+--------+----------------+-------------------+ Subclavian                Multiphasic, WNL                    +----------+--------+--------+----------------+-------------------+ +---------+--------+--------+--------------+ VertebralPSV cm/sEDV cm/sNot identified +---------+--------+--------+--------------+   Summary: Right Carotid: Velocities in 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 Left Carotid: Velocities in the left ICA are consistent with a 1-39% stenosis. Vertebrals:  Right vertebral artery demonstrates antegrade flow. Left vertebral              artery was not visualized. Subclavians: Normal flow hemodynamics were  seen in bilateral subclavian              arteries. *See table(s) above for measurements and observations.  Electronically signed by [name] MD on 05/18/2019 at 3:17:28 PM.    Final     PHYSICAL EXAM   Temp:  [97.6 F (36.4 C)-98.4 F (36.9 C)] 97.8 F (36.6 C) (05/05 1204) Pulse Rate:  [74-130] 109 (05/05 1204) Resp:  [15-25] 19 (05/05 1340) BP: (138-186)/(67-129) 140/89 (05/05 1220) SpO2:  [91 %-98 %] 95 % (05/05 1340)  General - obese, well developed, in no apparent distress.  Ophthalmologic - fundi not visualized due to noncooperation.  Cardiovascular - Regular rhythm and rate.  Mental Status -  Level of arousal and orientation to time, place, and person were intact. Language including expression,  naming, repetition, comprehension was assessed and found intact. Fund of Knowledge was assessed and was intact.  Cranial Nerves II - XII - II - Visual field intact OU. III, IV, VI - Extraocular movements intact. V - Facial sensation intact bilaterally. VII - Facial movement intact bilaterally. VIII - Hearing & vestibular intact bilaterally. X - Palate elevates symmetrically. XI - Chin turning & shoulder shrug intact bilaterally. XII - Tongue protrusion intact.  Motor Strength - The patient's strength was normal in RUE and RLE, however, LUE proximal 4/5 and bicep 3/5, tricep 4/5 and finger grip 2/5. LLE proximal 4+/5 and distal foot DF PF 3/5.  Bulk was normal and fasciculations were absent.   Motor Tone - Muscle tone was assessed at the neck and appendages and was normal.  Reflexes - The patient's reflexes were symmetrical in all extremities and she had no pathological reflexes.  Sensory - Light touch, temperature/pinprick were assessed and were decreased on the LUE, about 25% of the RUE, also decreased on the LLE, about 75% of the RLE.    Coordination - The patient had normal movements in the hands with no ataxia or dysmetria, but ataxic on the left FTN.  Tremor was absent.  Gait and Station - deferred.   ASSESSMENT/PLAN Beth [name] is a 79 y.o. female with history of HTN, memory, loss, seizures, stroke presenting with L sided weakness, L sensory loss and L hemineglect.   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 Although embolic from new onset AF can not be ruled out but felt less likely given high grade stenosis of right ICA.  Code Stroke CT head No acute abnormality. ASPECTS 10.     CTA head & neck no LVO. R ICA origin plaque 55% stenosis.   MRI  Small R MCA infarct. Old large R temporal lobe encephalomalacia. Mild small vessel disease.   Carotid Doppler  R ICA 60-79%  2D Echo EF 55-60%. No source of embolus    LDL 78  HgbA1c 6.0  SCDs for VTE prophylaxis  aspirin 81 mg daily prior to admission, now on aspirin 325 mg daily. Given her stroke is not big in size, OK with heparin IV for stroke prevention. Will recommend ASA down to 81 if heparin IV initiated.    Therapy recommendations:  CIR  Disposition:  pending   Atrial Fibrillation w/ RVR, new onset  New cardiem gtt and metoprolol  CHA2DS2-VASc Score = 7  Age in Years:  ?75   +2    Sex:  Female   Female   +1    Hypertension History:  yes   +1  Diabetes Mellitus:  No 0 Congestive Heart Failure History:  0  Vascular Disease History:  yes   +1     Stroke/TIA/Thromboembolism History:  yes   +2  Given her stroke is not big in size, OK with heparin IV for stroke prevention. Will recommend ASA down to 81 if heparin IV initiated. . Consider switch to [place] at discharge   R Carotid Stenosis  CTA head & neck  R ICA origin plaque 55% stenosis.   Carotid Doppler  R ICA 60-79%  On aspirin 325  Given her brain bleed was in 2008, and current CTA head and neck did not show evidence of aneurysm, OK with DAPT if needed for carotid stenting  Vascular surgery consultation Scot Dock) feels R ICA w/ 60-79% stenosis. Not a CEA candidate d/t high bifurcation. Likely ok for stenting ([facility]) w/ Dr. Donzetta Matters. OK with DAPT  Hx of SAH due to ?? aneurysm rupture  As per chart from Dr. Brett Fairy, pt had aneurysmal ICH in 2008  Developed seizure in 2009 and followed with Dr. Brett Fairy and doing well now with keppra 750 bid  CT head did not show any hx of craniotomy - ruled out clipping  CT head and CTA head and neck did not show aneurysm  Ok with DAPT if needed for carotid stenting  COPD  On home O2 24/7  Mild SOB during this hospitalization  Need anesthesia evaluation for perioperative procedure if needed  Management per primary team  Hypertensive Urgency  BP as high as 189/93 on arrival  BP 150-180s now . Permissive hypertension (OK if <  180/105) but gradually normalized in 5-7 days  . BP goal normotensive  Hyperlipidemia  Home meds:  lipitor 10  Now on lipitor 40  LDL 78, goal < 70  Continue statin at discharge  Other Stroke Risk Factors  Advanced age  Hx Cigarette smoker, quit 13 yrs ago  Morbid Obesity, Body mass index is 41.1 kg/m., recommend weight loss, diet and exercise as appropriate   Other Active Problems  Seizures, on keppra PTA, continued, followed by Dr. Brett Fairy at [facility]  Depression, anxiety on lexapro and klonopin  Baseline memory deficits - follwed by Dr. Brett Fairy at [facility]  Leukocytosis, resolved  Hyponatremia, resolved  Hospital day # 2  Rosalin Hawking, MD PhD Stroke Neurology 05/18/2019 3:18 PM    To contact Stroke Continuity provider, please refer to [email or web address] After hours, contact General Neurology

## 2019-05-18 NOTE — Progress Notes (Signed)
Physical Therapy Treatment Patient Details Name: Beth [name] MRN: [id number] DOB: 08/17/1940 Today's Date: 05/18/2019    History of Present Illness Pt is a 79 yo female admitted with sudden onset L side weakness, difficulty walking. 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  Pt with COPD and on home O2.    PT Comments    Patient cleared by RN to participate in therapy. This session limited to therex in supine and EOB given new order to maintain HR <100 bpm. HR 80-90s in supine and in 110s in sitting, A fib. Pt returned to supine and HR back into high 80s end of session. Pt with SpO2 93% or > on 2L O2 via [place]. Pt continues to present with L side weakness (UE > LE) and L inattention. Pt with bruising on top of L foot and c/o of pain. PT will continue to follow acutely and progress as tolerated.       Follow Up Recommendations  CIR;Supervision/Assistance - 24 hour     Equipment Recommendations  (TBD at next venue)    Recommendations for Other Services Rehab consult     Precautions / Restrictions Precautions Precautions: Fall Precaution Comments: L inattention; maintain HR <100  Restrictions Weight Bearing Restrictions: No    Mobility  Bed Mobility Overal bed mobility: Needs Assistance Bed Mobility: Supine to Sit;Sit to Supine     Supine to sit: Mod assist;HOB elevated Sit to supine: Min guard   General bed mobility comments: cues for sequencing and to use/protect L UE; assist to elevate trunk and to bring L hip to EOB   Transfers                    Ambulation/Gait                 Stairs             Wheelchair Mobility    Modified Rankin (Stroke Patients Only) Modified Rankin (Stroke Patients Only) Pre-Morbid Rankin Score: No symptoms Modified Rankin: Severe disability     Balance Overall balance assessment: Needs assistance Sitting-balance support: Feet supported Sitting balance-Leahy Scale:  Fair                                      Cognition Arousal/Alertness: Awake/alert Behavior During Therapy: WFL for tasks assessed/performed Overall Cognitive Status: Within Functional Limits for tasks assessed                                        Exercises General Exercises - Upper Extremity Shoulder Flexion: Self ROM;AAROM;Left Elbow Flexion: AAROM;Self ROM;Left Wrist Flexion: AROM;Left Wrist Extension: AROM;Left Digit Composite Flexion: AROM;Left Composite Extension: AROM;Left General Exercises - Lower Extremity Ankle Circles/Pumps: AROM;Both Long Arc Quad: AROM;Both;Seated Heel Slides: AROM;Both Hip Flexion/Marching: AROM;Both    General Comments        Pertinent Vitals/Pain Pain Assessment: Faces Faces Pain Scale: Hurts little more Pain Location: L foot (bruising noted on top of foot) Pain Descriptors / Indicators: Sore Pain Intervention(s): Limited activity within patient's tolerance;Monitored during session;Repositioned    Home Living                      Prior Function  PT Goals (current goals can now be found in the care plan section) Progress towards PT goals: Progressing toward goals    Frequency    Min 4X/week      PT Plan Current plan remains appropriate    Co-evaluation              AM-PAC PT "6 Clicks" Mobility   Outcome Measure  Help needed turning from your back to your side while in a flat bed without using bedrails?: A Lot Help needed moving from lying on your back to sitting on the side of a flat bed without using bedrails?: A Lot Help needed moving to and from a bed to a chair (including a wheelchair)?: A Lot Help needed standing up from a chair using your arms (e.g., wheelchair or bedside chair)?: A Little Help needed to walk in hospital room?: A Lot Help needed climbing 3-5 steps with a railing? : Total 6 Click Score: 12    End of Session Equipment Utilized During  Treatment: Oxygen Activity Tolerance: Patient tolerated treatment well Patient left: in bed;with call bell/phone within reach;with bed alarm set Nurse Communication: Mobility status PT Visit Diagnosis: Hemiplegia and hemiparesis Hemiplegia - Right/Left: Left Hemiplegia - dominant/non-dominant: Non-dominant Hemiplegia - caused by: Cerebral infarction     Time: [id number] PT Time Calculation (min) (ACUTE ONLY): 37 min  Charges:  $Therapeutic Exercise: 8-22 mins $Therapeutic Activity: 8-22 mins                     Earney Navy, PTA Acute Rehabilitation Services Pager: [phone] Office: [phone]     Darliss Cheney 05/18/2019, 5:42 PM

## 2019-05-18 NOTE — Progress Notes (Signed)
Paged on call Dr. Myna Hidalgo about patient's heart rate running 120-140's; awaiting orders at this time

## 2019-05-18 NOTE — Progress Notes (Signed)
Inpatient Rehabilitation Admissions Coordinator  Inpatient rehab consult received. I met at bedside with patient and her spouse to discuss goals and expectations of a possible inpt rehab admit. They are in agreement. I await medical workup completion before pursuing admission.  Danne Baxter, RN, MSN Rehab Admissions Coordinator [phone] 05/18/2019 12:51 PM

## 2019-05-18 NOTE — Progress Notes (Signed)
   VASCULAR SURGERY ASSESSMENT & PLAN:   RIGHT CAROTID STENOSIS: This patient has a moderate 55% right carotid stenosis by CT angiogram of the neck.  I have ordered a carotid duplex scan which is pending.  Based on my review of the CT angiogram this stenosis is not surgically accessible.  I will review with my partners to see if she is a candidate potentially for carotid stenting.   RAPID A. FIB: The patient is now having issues with rapid A. fib.  Her 2D echo did not show an intracardiac source for embolization.  May need to consider transesophageal echo.  I will follow-up after review of her carotid duplex scan.   SUBJECTIVE:   No complaints this morning.  PHYSICAL EXAM:   Vitals:   05/18/19 0531 05/18/19 0532 05/18/19 0533 05/18/19 0609  BP:    (!) 152/100  Pulse:    (!) 115  Resp: 17 15 17 17   Temp:    97.6 F (36.4 C)  TempSrc:    Oral  SpO2: 97% 96% 96% 92%  Weight:      Height:       Still with significant weakness and paresthesias in the left upper extremity.  LABS:   Lab Results  Component Value Date   WBC 10.5 05/18/2019   HGB 14.0 05/18/2019   HCT 41.7 05/18/2019   MCV 97.2 05/18/2019   PLT 248 05/18/2019   Lab Results  Component Value Date   CREATININE 0.66 05/18/2019   Lab Results  Component Value Date   INR 0.9 05/16/2019   CBG (last 3)  Recent Labs    05/16/19 1128  GLUCAP 158*    PROBLEM LIST:    Principal Problem:   CVA (cerebral vascular accident) ([place]) Active Problems:   Stroke, hemorrhagic ([place])   HTN (hypertension)   COPD without exacerbation ([facility])   Seizures ([place])   HLD (hyperlipidemia)   Macrocytosis   Depression   CURRENT MEDS:   . aspirin EC  325 mg Oral Daily  . atorvastatin  40 mg Oral Daily  . cholecalciferol  2,000 Units Oral Daily  . clonazePAM  2 mg Oral QHS  . diclofenac Sodium  2 g Topical QID  . escitalopram  20 mg Oral Daily  . levETIRAcetam  750 mg Oral BID  . vitamin B-12  500 mcg Oral Daily     [name] Office: [phone] 05/18/2019   VASCULAR SURGERY:  Now having problems with rapid A. Fib.  Echo: No intracardiac source of embolization detected.  Carotid duplex pending.  [name], MD Office: [phone]

## 2019-05-18 NOTE — Evaluation (Signed)
Speech Language Pathology Evaluation Patient Details Name: Beth [name] MRN: [id number] DOB: [birth year] Today's Date: 05/18/2019 Time: 9528-4132 SLP Time Calculation (min) (ACUTE ONLY): 31 min  Problem List:  Patient Active Problem List   Diagnosis Date Noted  . Seizures ([place])   . HLD (hyperlipidemia)   . Macrocytosis   . CVA (cerebral vascular accident) ([place])   . Depression   . Allergic rhinitis 05/02/2019  . At high risk for breast cancer 12/09/2017  . 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 Obesity, morbid, BMI 40.0-49.9 ([place]) 04/30/2016  . Chronic respiratory failure ([place]) 12/11/2015  . COPD without exacerbation ([place]) 10/08/2015  . Genetic testing 08/26/2015  . 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  . Family history of ovarian cancer 07/26/2015  . 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 Benign paroxysmal positional vertigo 04/06/2014  . Tonic-clonic seizure syndrome ([place]) 04/06/2014  . Pulsatile tinnitus of right ear 04/06/2014  . Other forms of epilepsy and recurrent seizures without mention of intractable epilepsy 04/21/2013  . SAH (subarachnoid hemorrhage) ([place]) 04/21/2013  . 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 Stroke, hemorrhagic ([place])   . HTN (hypertension)   . Vestibulitis of ear    Past Medical History:  Past Medical History:  Diagnosis Date  . BRCA2 positive 2017  . Diverticulosis   . Hearing loss   . HTN (hypertension)   . Memory loss   . 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 Seizures ([place])   . Stroke, hemorrhagic ([place]) aneurysm bleed.   . Vestibulitis of ear    Past Surgical History:  Past Surgical History:  Procedure Laterality Date  . broken ankle Right   . CEREBRAL ANEURYSM REPAIR    . TONSILLECTOMY     HPI:  Beth [name] is a 79 y.o. female with medical history significant of hypertension,  hyperlipidemia, depression/anxiety, aneurysmal intracranial hemorrhage in 2008, seizure disorder secondary to [place], Chronic hypoxemic respiratory failure on 2 L of oxygen via nasal cannula secondary to underlying COPD presents to emergency department due to sudden onset of left-sided weakness. MRI of Brain revealed small acute right MCA infarct and chronic right temporal lobe infarct.   Assessment / Plan / Recommendation Clinical Impression  Mrs. Balestrieri was seen for a cognitive-linguistic evaluation, which revealed very mild cognitive deficits, different from baseline. Husband was present to aid as historian. They largely report her cognition as baseline, but upon testing felt that she was having slower responses/slowed processing. Pt does not handle any IADLs at baseline. Husband drives, manages finances/meds, cooks, etc. She was assessed with the [facility] Mental Status Examination with total score of 26/30 (27+/30 is WNL). Errors were in calculations and paragraph memory. Although all other areas were correct, pt had significantly delayed processing and required repetition of directions. She reports feeling "foggy" and that she cannot accurately recall what each provider is telling her as it "all blends together." D/t deficits being so mild in nature, no further ST indicated on acute level. Pt is expected to benefit from at least a repeat cognitive evaluation at next level of care to assess for processing speeds and more thorough/complex problem solving. Pt/family in agreement.     SLP Assessment  SLP Recommendation/Assessment: All further Speech Lanaguage Pathology  needs can be addressed in the next venue of care SLP Visit Diagnosis: Cognitive communication deficit (R41.841)    Follow Up  Recommendations  Inpatient Rehab    Frequency and Duration   No acute SLP indicated; follow up for mild deficits at next level of care        SLP Evaluation Cognition  Overall Cognitive Status:  Within Functional Limits for tasks assessed Arousal/Alertness: Awake/alert Orientation Level: Oriented X4 Attention: Focused;Sustained Focused Attention: Appears intact Sustained Attention: Impaired Sustained Attention Impairment: Verbal complex Memory: Appears intact Executive Function: Decision Making;Reasoning Reasoning: Appears intact Decision Making: Appears intact Safety/Judgment: Appears intact [employer] Scales of Cognitive Functioning: Purposeful/appropriate       Oral / Motor  Oral Motor/Sensory Function Overall Oral Motor/Sensory Function: Within functional limits Motor Speech Overall Motor Speech: Appears within functional limits for tasks assessed Respiration: Within functional limits Phonation: Normal Resonance: Within functional limits Articulation: Within functional limitis Intelligibility: Intelligible Motor Planning: Witnin functional limits   Okema Rollinson P. Cassadi Purdie, M.S., CCC-SLP Speech-Language Pathologist Acute Rehabilitation Services Pager: [place] 05/18/2019, 11:20 AM

## 2019-05-19 DIAGNOSIS — I6521 Occlusion and stenosis of right carotid artery: Secondary | ICD-10-CM | POA: Diagnosis not present

## 2019-05-19 DIAGNOSIS — I63411 Cerebral infarction due to embolism of right middle cerebral artery: Principal | ICD-10-CM

## 2019-05-19 LAB — HEPARIN LEVEL (UNFRACTIONATED): Heparin Unfractionated: <0.1 IU/mL — ABNORMAL LOW (ref 0.30–0.70)

## 2019-05-19 MED ORDER — ASPIRIN EC 81 MG PO TBEC
81.0000 mg | DELAYED_RELEASE_TABLET | Freq: Every day | ORAL | Status: DC
  Administered 2019-05-19: 81 mg via ORAL

## 2019-05-19 MED ORDER — HEPARIN (PORCINE) 25000 UT/250ML-% IV SOLN
1200.0000 [IU]/h | INTRAVENOUS | Status: DC
  Administered 2019-05-19: 1150 [IU]/h via INTRAVENOUS
  Administered 2019-05-20: 1200 [IU]/h via INTRAVENOUS
  Filled 2019-05-19 (×2): qty 250

## 2019-05-19 MED ORDER — METOPROLOL TARTRATE 25 MG PO TABS: 25.0000 mg | ORAL_TABLET | Freq: Two times a day (BID) | ORAL | Status: DC

## 2019-05-19 NOTE — Progress Notes (Signed)
ANTICOAGULATION CONSULT NOTE - Initial Consult  Pharmacy Consult for heparin Indication: atrial fibrillation/CVA  Allergies  Allergen Reactions  . Prozac [Fluoxetine Hcl] Other (See Comments)    seizures  . Sulfa Antibiotics Nausea And Vomiting and Other (See Comments)    TINGLING IN LEGS ALSO  . Micardis [Telmisartan] Swelling and Other (See Comments)    Swelling, bruising Swelling, bruising  . Prednisone Other (See Comments)    mood changes Mood changes  . Lisinopril-Hydrochlorothiazide Other (See Comments)    unknown  . Augmentin [Amoxicillin-Pot Clavulanate] Rash  . Avelox [Moxifloxacin Hcl In Nacl] Rash    LEVAQUIN IS OK-SEE NOTE  . Norvasc [Amlodipine Besylate] Other (See Comments)    fatigue    Patient Measurements: Height: '5\' 4"'  (162.6 cm) Weight: 108.6 kg (239 lb 6.7 oz) IBW/kg (Calculated) : 54.7 Heparin Dosing Weight: 80kg  Vital Signs: Temp: 98.5 F (36.9 C) (05/06 0412) Temp Source: Oral (05/06 0412) BP: 129/70 (05/06 0412) Pulse Rate: 87 (05/06 0412)  Labs: Recent Labs    05/16/19 1030 05/16/19 1030 05/16/19 1134 05/16/19 1134 05/16/19 1839 05/17/19 0346 05/18/19 0503  HGB 14.2   < > 14.6   < >  --  12.9 14.0  HCT 43.4   < > 43.0  --  40.3 37.7 41.7  PLT 274  --   --   --   --  247 248  APTT 28  --   --   --   --   --   --   LABPROT 12.1  --   --   --   --   --   --   INR 0.9  --   --   --   --   --   --   CREATININE 0.79   < > 0.70  --   --  0.74 0.66   < > = values in this interval not displayed.    Estimated Creatinine Clearance: 70.9 mL/min (by C-G formula based on SCr of 0.66 mg/dL).   Medical History: Past Medical History:  Diagnosis Date  . BRCA2 positive 2017  . Diverticulosis   . Hearing loss   . HTN (hypertension)   . Memory loss   . Seizure disorder, complex partial, 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[place]) seizure free for one year  . Seizures ([place])   . Stroke, hemorrhagic ([place]) aneurysm bleed.   . Vestibulitis of ear      Medications:  Medications Prior to Admission  Medication Sig Dispense Refill Last Dose  . albuterol (PROVENTIL HFA;VENTOLIN HFA) 108 (90 Base) MCG/ACT inhaler Inhale 2 puffs into the lungs every 4 (four) hours as needed for wheezing or shortness of breath. 1 Inhaler 5 Past Week at Unknown time  . aspirin EC 81 MG tablet Take 81 mg by mouth daily.   05/15/2019 at Unknown time  . atenolol (TENORMIN) 50 MG tablet Take 25 mg by mouth 2 (two) times daily.    05/16/2019 at 9;45am  . captopril (CAPOTEN) 50 MG tablet Take 50 mg by mouth 2 (two) times daily.   05/16/2019 at Unknown time  . Cholecalciferol (VITAMIN D3) 2000 units capsule 1 tablet by mouth daily   05/15/2019 at Unknown time  . clonazePAM (KLONOPIN) 2 MG tablet TAKE 1 TABLET AT BEDTIME (Patient taking differently: Take 1 mg by mouth daily. For seizure activity or insomnia dohmeier.) 90 tablet 0 05/16/2019 at Unknown time  . fluticasone (FLONASE) 50 MCG/ACT nasal spray Place 1 spray into the nose daily as  needed for allergies. Spray twice daily as needed    05/15/2019 at Unknown time  . levETIRAcetam 750 MG TB3D Take 750 mg by mouth 2 (two) times daily. 180 tablet 0 05/16/2019 at Unknown time  . LEXAPRO 20 MG tablet Take 1 tablet (20 mg total) by mouth daily. Morning dosage 90 tablet 3 05/15/2019 at Unknown time  . simvastatin (ZOCOR) 10 MG tablet Take 10 mg by mouth at bedtime.   05/15/2019 at Unknown time  . Tiotropium Bromide-Olodaterol 2.5-2.5 MCG/ACT AERS Inhale 2 puffs into the lungs daily. 4 g 3 05/16/2019 at Unknown time  . triamterene-hydrochlorothiazide (MAXZIDE-25) 37.5-25 MG tablet Take 1 tablet by mouth daily.   05/15/2019 at Unknown time   Scheduled:  . aspirin EC  325 mg Oral Daily  . atorvastatin  40 mg Oral Daily  . cholecalciferol  2,000 Units Oral Daily  . clonazePAM  2 mg Oral QHS  . diclofenac Sodium  2 g Topical QID  . escitalopram  20 mg Oral Daily  . levETIRAcetam  750 mg Oral BID  . vitamin B-12  500 mcg Oral Daily   Infusions:   . diltiazem (CARDIZEM) infusion 15 mg/hr (05/19/19 1014)    Assessment: Pt presented with left sided weakness. She has a hx of ICH in the past. She was dx with new CVA with afib. IV heparin has been ordered for anticoagulation. We will use a lower goal due to risk of hemorrhagic transformation.   Scr wnl CBC wnl  Goal of Therapy:  Heparin level: 0.3-0.5 Monitor platelets by anticoagulation protocol: Yes   Plan:  Heparin infusion at 1150 units/hr Check 8 hr heparin level Daily HL and CBC  Onnie Boer, PharmD, BCIDP, AAHIVP, CPP Infectious Disease Pharmacist 05/19/2019 10:21 AM

## 2019-05-19 NOTE — Progress Notes (Signed)
Physical Therapy Treatment Patient Details Name: Beth [name] MRN: [id number] DOB: Nov 01, 1940 Today's Date: 05/19/2019    History of Present Illness Pt is a 79 yo female admitted with sudden onset L side weakness, difficulty walking. 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  Pt with COPD and on home O2.    PT Comments    Patient is making progress toward PT goals and able to ambulate ~6 ft with mod A +2 and RW. Pt continues to present with L side weakness and inattention. Pt remains a good candidate for CIR level therapies.    Follow Up Recommendations  CIR;Supervision/Assistance - 24 hour     Equipment Recommendations  (TBD at next venue)    Recommendations for Other Services Rehab consult     Precautions / Restrictions Precautions Precautions: Fall Precaution Comments: L inattention; maintain HR <100  Restrictions Weight Bearing Restrictions: No    Mobility  Bed Mobility Overal bed mobility: Needs Assistance Bed Mobility: Supine to Sit     Supine to sit: Mod assist;HOB elevated     General bed mobility comments: cues for sequencing and to use/protect L UE; assist to elevate trunk and to bring L hip to EOB   Transfers Overall transfer level: Needs assistance Equipment used: Rolling walker (2 wheeled) Transfers: Sit to/from Stand Sit to Stand: Mod assist;+2 safety/equipment         General transfer comment: cues for hand placement; assist to maintain L hand grip on RW, to power up into standing, and to gain balance upon stand; pt with L lateral bias and multimodal cues needed to correct to midline  Ambulation/Gait Ambulation/Gait assistance: Mod assist;+2 physical assistance;+2 safety/equipment Gait Distance (Feet): 6 Feet Assistive device: Rolling walker (2 wheeled) Gait Pattern/deviations: Step-through pattern;Decreased weight shift to right;Decreased dorsiflexion - left;Decreased step length - right;Decreased step  length - left;Decreased stance time - left     General Gait Details: cues for sequencing and multimodal cues and assist to weight shift; assist required for balance and managing RW    Stairs             Wheelchair Mobility    Modified Rankin (Stroke Patients Only) Modified Rankin (Stroke Patients Only) Pre-Morbid Rankin Score: No symptoms Modified Rankin: Moderately severe disability     Balance Overall balance assessment: Needs assistance Sitting-balance support: Feet supported Sitting balance-Leahy Scale: Fair   Postural control: Left lateral lean Standing balance support: Bilateral upper extremity supported;During functional activity Standing balance-Leahy Scale: Poor                              Cognition Arousal/Alertness: Awake/alert Behavior During Therapy: WFL for tasks assessed/performed Overall Cognitive Status: Within Functional Limits for tasks assessed                                 General Comments: L inattention      Exercises      General Comments        Pertinent Vitals/Pain Pain Assessment: Faces Faces Pain Scale: Hurts a little bit Pain Location: L foot  Pain Descriptors / Indicators: Sore Pain Intervention(s): Monitored during session    Home Living                      Prior Function  PT Goals (current goals can now be found in the care plan section) Progress towards PT goals: Progressing toward goals    Frequency    Min 4X/week      PT Plan Current plan remains appropriate    Co-evaluation PT/OT/SLP Co-Evaluation/Treatment: Yes Reason for Co-Treatment: For patient/therapist safety;To address functional/ADL transfers PT goals addressed during session: Mobility/safety with mobility;Balance        AM-PAC PT "6 Clicks" Mobility   Outcome Measure  Help needed turning from your back to your side while in a flat bed without using bedrails?: A Lot Help needed moving from  lying on your back to sitting on the side of a flat bed without using bedrails?: A Lot Help needed moving to and from a bed to a chair (including a wheelchair)?: A Lot Help needed standing up from a chair using your arms (e.g., wheelchair or bedside chair)?: A Little Help needed to walk in hospital room?: A Lot Help needed climbing 3-5 steps with a railing? : Total 6 Click Score: 12    End of Session Equipment Utilized During Treatment: Oxygen;Gait belt Activity Tolerance: Patient tolerated treatment well Patient left: with call bell/phone within reach;in chair;with chair alarm set Nurse Communication: Mobility status PT Visit Diagnosis: Hemiplegia and hemiparesis Hemiplegia - Right/Left: Left Hemiplegia - dominant/non-dominant: Non-dominant Hemiplegia - caused by: Cerebral infarction     Time: 0920-0957 PT Time Calculation (min) (ACUTE ONLY): 37 min  Charges:  $Gait Training: 8-22 mins                     Earney Navy, PTA Acute Rehabilitation Services Pager: [phone] Office: [phone]     Darliss Cheney 05/19/2019, 2:21 PM

## 2019-05-19 NOTE — Progress Notes (Signed)
RN helping NT return pt to bed via steady and pt's iv was pulled out.  No bleeding noted from site and catheter tip intact.  Pt had heparin and diltiazem infusing, RN called pharmacy and pharmacy confirmed that the medications are compatible.  Both medications were restarted on remaining iv and patient's primary RN was notified.

## 2019-05-19 NOTE — Progress Notes (Signed)
7STROKE TEAM PROGRESS NOTE   INTERVAL HISTORY No family at bedside. Pt sitting in bed, awake alert, talkative. Still in afib RVR, HR better than yesterday, on heparin and cardizem IV. VVS Dr. Scot Dock has discussed with pt about carotid stenting soon.    Vitals:   05/18/19 1900 05/18/19 2015 05/19/19 0029 05/19/19 0412  BP: 115/80 (!) 148/76 (!) 124/56 129/70  Pulse:  93 80 87  Resp: '18 19 17 19  ' Temp:  98.1 F (36.7 C) 98 F (36.7 C) 98.5 F (36.9 C)  TempSrc:  Oral Oral Oral  SpO2: 92% 96% 98% 97%  Weight:      Height:        CBC:  Recent Labs  Lab 05/16/19 1030 05/16/19 1134 05/17/19 0346 05/18/19 0503  WBC 10.2   < > 12.5* 10.5  NEUTROABS 6.6  --  9.0*  --   HGB 14.2   < > 12.9 14.0  HCT 43.4   < > 37.7 41.7  MCV 100.9*   < > 96.9 97.2  PLT 274   < > 247 248   < > = values in this interval not displayed.    Basic Metabolic Panel:  Recent Labs  Lab 05/17/19 0346 05/18/19 0503  NA 133* 138  K 3.7 3.7  CL 100 103  CO2 26 26  GLUCOSE 116* 140*  BUN 8 7*  CREATININE 0.74 0.66  CALCIUM 8.8* 9.0   Lipid Panel:     Component Value Date/Time   CHOL 151 05/17/2019 0346   TRIG 172 (H) 05/17/2019 0346   HDL 39 (L) 05/17/2019 0346   CHOLHDL 3.9 05/17/2019 0346   VLDL 34 05/17/2019 0346   LDLCALC 78 05/17/2019 0346   HgbA1c:  Lab Results  Component Value Date   HGBA1C 6.0 (H) 05/17/2019   Urine Drug Screen:     Component Value Date/Time   LABOPIA NONE DETECTED 05/16/2019 1556   COCAINSCRNUR NONE DETECTED 05/16/2019 1556   LABBENZ NONE DETECTED 05/16/2019 1556   AMPHETMU NONE DETECTED 05/16/2019 1556   THCU NONE DETECTED 05/16/2019 1556   LABBARB NONE DETECTED 05/16/2019 1556    Alcohol Level     Component Value Date/Time   ETH <10 05/16/2019 1030    IMAGING past 24 hours VAS US CAROTID  Result Date: 05/18/2019 Carotid Arterial Duplex Study Indications:                            CVA. Limitations                             Today's exam was  limited due to the high                                         bifurcation of the carotid, heavy                                         calcification and the resulting                                         shadowing and the  patient's respiratory                                         variation. Comparison Study:                       No prior study Pre-Surgical Evaluation & Surgical      Stenosis at [facility] only. Right bifurcation Correlation                             is located near the mandible. Performing Technologist: Maudry Mayhew MHA, RDMS, RVT, RDCS  Examination Guidelines: A complete evaluation includes B-mode imaging, spectral Doppler, color Doppler, and power Doppler as needed of all accessible portions of each vessel. Bilateral testing is considered an integral part of a complete examination. Limited examinations for reoccurring indications may be performed as noted.  Right Carotid Findings: +----------+--------+-------+--------+------------------------+----------------+           PSV cm/sEDV    StenosisPlaque Description      Comments                           cm/s                                                    +----------+--------+-------+--------+------------------------+----------------+ CCA Prox  44      10                                                      +----------+--------+-------+--------+------------------------+----------------+ CCA Distal32      9                                                       +----------+--------+-------+--------+------------------------+----------------+ ICA Prox  324     61     60-79%  heterogenous and        high bifurcation                                  calcific                                 +----------+--------+-------+--------+------------------------+----------------+ ICA Distal82      22                                     tortuous          +----------+--------+-------+--------+------------------------+----------------+ ECA       199     43             calcific                                 +----------+--------+-------+--------+------------------------+----------------+ +----------+--------+-------+----------------+-------------------+  PSV cm/sEDV cmsDescribe        Arm Pressure (mmHG) +----------+--------+-------+----------------+-------------------+ Subclavian101            Multiphasic, WNL                    +----------+--------+-------+----------------+-------------------+ +---------+--------+--+--------+-+---------+ VertebralPSV cm/s24EDV cm/s6Antegrade +---------+--------+--+--------+-+---------+  Left Carotid Findings: +----------+--------+--------+--------+-------------------------+--------+           PSV cm/sEDV cm/sStenosisPlaque Description       Comments +----------+--------+--------+--------+-------------------------+--------+ CCA Prox  103     19                                                +----------+--------+--------+--------+-------------------------+--------+ CCA Distal83      21              calcific                          +----------+--------+--------+--------+-------------------------+--------+ ICA Prox  56      16              heterogenous and calcific         +----------+--------+--------+--------+-------------------------+--------+ ICA Distal51      13                                                +----------+--------+--------+--------+-------------------------+--------+ ECA       112     20                                                +----------+--------+--------+--------+-------------------------+--------+ +----------+--------+--------+----------------+-------------------+           PSV cm/sEDV cm/sDescribe        Arm Pressure (mmHG) +----------+--------+--------+----------------+-------------------+ Subclavian                 Multiphasic, WNL                    +----------+--------+--------+----------------+-------------------+ +---------+--------+--------+--------------+ VertebralPSV cm/sEDV cm/sNot identified +---------+--------+--------+--------------+   Summary: Right Carotid: Velocities in 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 Left Carotid: Velocities in the left ICA are consistent with a 1-39% stenosis. Vertebrals:  Right vertebral artery demonstrates antegrade flow. Left vertebral              artery was not visualized. Subclavians: Normal flow hemodynamics were seen in bilateral subclavian              arteries. *See table(s) above for measurements and observations.  Electronically signed by [name] MD on 05/18/2019 at 3:17:28 PM.    Final     PHYSICAL EXAM   Temp:  [97.5 F (36.4 C)-98.5 F (36.9 C)] 98.5 F (36.9 C) (05/06 0412) Pulse Rate:  [80-109] 87 (05/06 0412) Resp:  [17-22] 19 (05/06 0412) BP: (115-158)/(56-97) 129/70 (05/06 0412) SpO2:  [90 %-98 %] 97 % (05/06 0412)  General - obese, well developed, in no apparent distress.  Ophthalmologic - fundi not visualized due to noncooperation.  Cardiovascular - Regular rhythm and rate.  Mental Status -  Level of arousal and orientation to time, place, and person were intact. Language including expression, naming, repetition, comprehension was assessed and found intact. Fund of Knowledge was assessed and was intact.  Cranial Nerves II - XII - II - Visual field intact OU. III, IV, VI - Extraocular movements intact. V - Facial sensation intact bilaterally. VII - Facial movement intact bilaterally. VIII - Hearing & vestibular intact bilaterally. X - Palate elevates symmetrically. XI - Chin turning & shoulder shrug intact bilaterally. XII - Tongue protrusion intact.  Motor Strength - The patient's strength was normal in RUE and RLE, however, LUE proximal 4/5 and bicep 3/5, tricep 4/5 and finger grip  2/5. LLE proximal 4+/5 and distal foot DF PF 3/5.  Bulk was normal and fasciculations were absent.   Motor Tone - Muscle tone was assessed at the neck and appendages and was normal.  Reflexes - The patient's reflexes were symmetrical in all extremities and she had no pathological reflexes.  Sensory - Light touch, temperature/pinprick were assessed and were decreased on the LUE, about 25% of the RUE, also decreased on the LLE, about 75% of the RLE.    Coordination - The patient had normal movements in the hands with no ataxia or dysmetria, but ataxic on the left FTN.  Tremor was absent.  Gait and Station - deferred.   ASSESSMENT/PLAN Ms. ADRIANA QUINBY is a 79 y.o. female with history of HTN, memory, loss, seizures, stroke presenting with L sided weakness, L sensory loss and L hemineglect.   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 Although embolic from new onset AF can not be ruled out but felt less likely given high grade stenosis of right ICA.  Code Stroke CT head No acute abnormality. ASPECTS 10.     CTA head & neck no LVO. R ICA origin plaque 55% stenosis.   MRI  Small R MCA infarct. Old large R temporal lobe encephalomalacia. Mild small vessel disease.   Carotid Doppler  R ICA 60-79%  2D Echo EF 55-60%. No source of embolus   LDL 78  HgbA1c 6.0  SCDs for VTE prophylaxis  aspirin 81 mg daily prior to admission, now on aspirin heparin IV and ASA 81.  Therapy recommendations:  CIR  Disposition:  pending   Atrial Fibrillation w/ RVR, new onset  New cardiem gtt and metoprolol  CHA2DS2-VASc Score = 7  Age in Years:  ?41   +2    Sex:  Female   Female   +1    Hypertension History:  yes   +1     Diabetes Mellitus:  No 0 Congestive Heart Failure History:  0  Vascular Disease History:  yes   +1     Stroke/TIA/Thromboembolism History:  yes   +2  Now on heparin IV and ASA 81  . Consider switch to DOAC at discharge.    R Carotid Stenosis  CTA head & neck  R ICA origin plaque 55% stenosis.   Carotid Doppler  R ICA 60-79%  On aspirin 81 and heparin IV  Vascular surgery consultation Scot Dock) feels R ICA w/ 60-79% stenosis. Not a CEA candidate d/t high bifurcation. Ok for stenting Ivor Reining) w/ Dr. Donzetta Matters. Plan procedure in next week or so.   As per Dr. Scot Dock - pt will need Plavix for at least 3 days prior to the procedure and we need to continue this for least a month.  Given her brain bleed was in 2008, and current CTA head and neck did not show evidence of aneurysm, OK with plavix if needed for carotid stenting    Hx of SAH due to ?? aneurysm rupture  As per chart from Dr. Brett Fairy, pt had aneurysmal ICH in 2008  Developed seizure in 2009 and followed with Dr. Brett Fairy and doing well now with keppra 750 bid  However, CT head and CTA head and neck did not show aneurysm and CT head did not show evidence of prior intervention  Pt was diagnosed with ICH by MRI as outpt and never hospitalized for this, not sure if acute or chronic bleeding at that time.  Ok with plavix if needed for carotid stenting  COPD  On home O2 24/7  Mild SOB during this hospitalization  Need anesthesia evaluation for perioperative procedure if needed  Management per primary team  Hypertensive Urgency  BP as high as 189/93 on arrival  BP 120s-140s now . Permissive hypertension (OK if < 180/105) but gradually normalized in 3-5 days  . BP goal normotensive  Hyperlipidemia  Home meds:  lipitor 10  Now on lipitor 40  LDL 78, goal < 70  Continue statin at discharge  Other Stroke Risk Factors  Advanced age  Hx Cigarette smoker, quit 13 yrs ago  Morbid Obesity, Body mass index is 41.1 kg/m., recommend weight loss, diet and exercise as appropriate   Other Active Problems  Seizures, on keppra PTA, continued, followed by Dr. Brett [facility]y at [facility]  Depression, anxiety on lexapro and klonopin  Baseline memory  deficits - follwed [facility] Dr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   Hospital day # 3  Neurology will sign off. Please call with questions. Pt will follow up with Dr. Brett Fairy at [facility] in about 4 weeks after discharge. Thanks for the consult.   Rosalin Hawking, MD PhD Stroke Neurology 05/19/2019 11:49 AM    To contact Stroke Continuity provider, please refer to [email or web address] After hours, contact General Neurology

## 2019-05-19 NOTE — Progress Notes (Signed)
   VASCULAR SURGERY ASSESSMENT & PLAN:   SYMPTOMATIC RIGHT CAROTID STENOSIS: Her carotid duplex scan suggested a 60 to 79% carotid stenosis based on the velocities.  Thus I think this is clearly a symptomatic lesion which caused her right MCA stroke.  She is not a candidate for carotid endarterectomy as I cannot clamp above the plaque given her high bifurcation and the extent of the plaque distally.  However I have reviewed the films with Dr. Donzetta Matters, and she is felt to be a candidate for transcarotid stenting (TCAR).  For this procedure she would need to be on Plavix for at least 3 days prior to the procedure and we need to continue this for least a month.  Given that her intracranial hemorrhage was in 2008 I do not think this is a contraindication bowel will wait neurology's input.  She is on a statin and is currently on 325 mg a day of aspirin which he I believe is going to be decreased to 81 mg.  With respect to timing of carotid stenting this could potentially be done next week or the following week.  Therefore if she is discharged before that she can return as an outpatient for the procedure.  I have discussed the plan in detail with both the patient at the bedside and with her daughter [name] over the phone [phone]).  SUBJECTIVE:   No specific complaints this morning  PHYSICAL EXAM:   Vitals:   05/18/19 1900 05/18/19 2015 05/19/19 0029 05/19/19 0412  BP: 115/80 (!) 148/76 (!) 124/56 129/70  Pulse:  93 80 87  Resp: 18 19 17 19   Temp:  98.1 F (36.7 C) 98 F (36.7 C) 98.5 F (36.9 C)  TempSrc:  Oral Oral Oral  SpO2: 92% 96% 98% 97%  Weight:      Height:       Still with significant left arm weakness and mild left lower extremity weakness.  LABS:   CBG (last 3)  Recent Labs    05/16/19 1128  GLUCAP 158*    PROBLEM LIST:    Principal Problem:   CVA (cerebral vascular accident) ([place]) Active Problems:   Stroke, hemorrhagic ([place])   HTN (hypertension)   COPD without  exacerbation (HCC)   Seizures (HCC)   HLD (hyperlipidemia)   Macrocytosis   Depression   Atrial fibrillation with RVR (HCC)   CURRENT MEDS:   . aspirin EC  325 mg Oral Daily  . atorvastatin  40 mg Oral Daily  . cholecalciferol  2,000 Units Oral Daily  . clonazePAM  2 mg Oral QHS  . diclofenac Sodium  2 g Topical QID  . escitalopram  20 mg Oral Daily  . levETIRAcetam  750 mg Oral BID  . vitamin B-12  500 mcg Oral Daily    [name] Office: [phone] 05/19/2019

## 2019-05-19 NOTE — Progress Notes (Signed)
ANTICOAGULATION CONSULT NOTE - Follow Up Consult  Pharmacy Consult for heparin Indication: atrial fibrillation/CVA  Allergies  Allergen Reactions  . Prozac [Fluoxetine Hcl] Other (See Comments)    seizures  . Sulfa Antibiotics Nausea And Vomiting and Other (See Comments)    TINGLING IN LEGS ALSO  . Micardis [Telmisartan] Swelling and Other (See Comments)    Swelling, bruising Swelling, bruising  . Prednisone Other (See Comments)    mood changes Mood changes  . Lisinopril-Hydrochlorothiazide Other (See Comments)    unknown  . Augmentin [Amoxicillin-Pot Clavulanate] Rash  . Avelox [Moxifloxacin Hcl In Nacl] Rash    LEVAQUIN IS OK-SEE NOTE  . Norvasc [Amlodipine Besylate] Other (See Comments)    fatigue    Patient Measurements: Height: '5\' 4"'  (162.6 cm) Weight: 108.6 kg (239 lb 6.7 oz) IBW/kg (Calculated) : 54.7 Heparin Dosing Weight: 80 kg  Vital Signs: BP: 102/63 (05/06 1651) Pulse Rate: 113 (05/06 1505)  Labs: Recent Labs    05/17/19 0346 05/18/19 0503 05/19/19 1825  HGB 12.9 14.0  --   HCT 37.7 41.7  --   PLT 247 248  --   HEPARINUNFRC  --   --  <0.10*  CREATININE 0.74 0.66  --     Estimated Creatinine Clearance: 70.9 mL/min (by C-G formula based on SCr of 0.66 mg/dL).   Medical History: Past Medical History:  Diagnosis Date  . BRCA2 positive 2017  . Diverticulosis   . Hearing loss   . HTN (hypertension)   . Memory loss   . 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 Seizures ([place])   . Stroke, hemorrhagic ([place]) aneurysm bleed.   Elwyn Reach of ear     Assessment: 79 yr old female presented on 05/16/19 with left-sided weakness. She has a hx of ICH in the past. She was dx with new CVA with afib. IV heparin has been ordered for anticoagulation. We will use a lower goal (with no boluses), due to risk of hemorrhagic transformation. Pt was on no anticoagulation PTA.  CBC WNL; aPTT 28 sec, INR  0.9  Heparin level ~7 hrs after heparin infusion was started at 1150 units/hr was <0.10 units/ml, which is below the goal range for this pt. Per RN, no issues with IV or bleeding observed.  Goal of Therapy:  Heparin level: 0.3-0.5 units/ml Monitor platelets by anticoagulation protocol: Yes   Plan:  Increase heparin infusion to 1300 units/hr Check 8-hr heparin level Monitor daily heparin level, CBC Monitor for signs/symptoms of bleeding F/U switching to DOAC at discharge  Gillermina Hu, PharmD, BCPS, [place] Pharmacist 05/19/2019 7:54 PM

## 2019-05-19 NOTE — Progress Notes (Signed)
Occupational Therapy Treatment Patient Details Name: DNAE KURMAN MRN: [id number] DOB: 12-07-1940 Today's Date: 05/19/2019    History of present illness Pt is a 79 yo female admitted with sudden onset L side weakness, difficulty walking. 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  Pt with COPD and on home O2.   OT comments  Patient continues to make steady progress towards goals in skilled OT session. Patient's session encompassed co-treat with PT in order to increase activity tolerance to complete ADLs and functional mobility. Pt demonstrates increased improvement in bed mobility and sequencing in motor planning, but requires reiteration of cues in order to complete in a fluid manner. Pt motivated to ambulate to toilet, however due to HR restrictions (cannot go above 100 bpm) ambulated to recliner in session. Pt would continue to greatly benefit from CIR due to ability to complete increased hours of therapy and further training in neuromuscular re-education; will continue to follow acutely.    Follow Up Recommendations  CIR;Supervision/Assistance - 24 hour    Equipment Recommendations  3 in 1 bedside commode    Recommendations for Other Services      Precautions / Restrictions Precautions Precautions: Fall Precaution Comments: L inattention; maintain HR <100  Restrictions Weight Bearing Restrictions: No       Mobility Bed Mobility Overal bed mobility: Needs Assistance Bed Mobility: Supine to Sit     Supine to sit: Mod assist;HOB elevated     General bed mobility comments: cues for sequencing and to use/protect L UE; assist to elevate trunk and to bring L hip to EOB   Transfers Overall transfer level: Needs assistance Equipment used: Rolling walker (2 wheeled) Transfers: Sit to/from Stand Sit to Stand: Mod assist;+2 safety/equipment Stand pivot transfers: Mod assist       General transfer comment: cues for hand placement;  assist to maintain L hand grip on RW, to power up into standing, and to gain balance upon stand; pt with L lateral bias and multimodal cues needed to correct to midline    Balance Overall balance assessment: Needs assistance Sitting-balance support: Feet supported Sitting balance-Leahy Scale: Fair   Postural control: Left lateral lean Standing balance support: Bilateral upper extremity supported;During functional activity Standing balance-Leahy Scale: Poor                             ADL either performed or assessed with clinical judgement   ADL Overall ADL's : Needs assistance/impaired Eating/Feeding: Sitting;Cueing for safety;Set up;Cueing for compensatory techinques                   Lower Body Dressing: Maximal assistance;Sit to/from stand;Cueing for compensatory techniques;Cueing for safety Lower Body Dressing Details (indicate cue type and reason): Pt unable to don socks, however able to demonstrate figure 4 on R, unable to complete on L             Functional mobility during ADLs: Moderate assistance;Cueing for safety General ADL Comments: pt with increased abilities in session to complete ADLs, however due to HR needing to stay below 100, activity remains limited     Vision       Perception     Praxis      Cognition Arousal/Alertness: Awake/alert Behavior During Therapy: WFL for tasks assessed/performed Overall Cognitive Status: Within Functional Limits for tasks assessed Area of Impairment: Attention;Safety/judgement;Awareness  Current Attention Level: Selective     Safety/Judgement: Decreased awareness of safety;Decreased awareness of deficits Awareness: Emergent   General Comments: L inattention        Exercises     Shoulder Instructions       General Comments      Pertinent Vitals/ Pain       Pain Assessment: Faces Faces Pain Scale: Hurts a little bit Pain Location: L foot  Pain Descriptors /  Indicators: Sore Pain Intervention(s): Monitored during session;Limited activity within patient's tolerance;Repositioned  Home Living                                          Prior Functioning/Environment              Frequency  Min 2X/week        Progress Toward Goals  OT Goals(current goals can now be found in the care plan section)  Progress towards OT goals: Progressing toward goals  Acute Rehab OT Goals Patient Stated Goal: to get stronger OT Goal Formulation: With patient Time For Goal Achievement: 05/31/19 Potential to Achieve Goals: Good  Plan Discharge plan remains appropriate    Co-evaluation    PT/OT/SLP Co-Evaluation/Treatment: Yes Reason for Co-Treatment: To address functional/ADL transfers;For patient/therapist safety PT goals addressed during session: Mobility/safety with mobility;Balance OT goals addressed during session: ADL's and self-care      AM-PAC OT "6 Clicks" Daily Activity     Outcome Measure   Help from another person eating meals?: A Little Help from another person taking care of personal grooming?: A Little Help from another person toileting, which includes using toliet, bedpan, or urinal?: A Lot Help from another person bathing (including washing, rinsing, drying)?: A Lot Help from another person to put on and taking off regular upper body clothing?: A Lot Help from another person to put on and taking off regular lower body clothing?: A Lot 6 Click Score: 14    End of Session Equipment Utilized During Treatment: Gait belt;Oxygen  OT Visit Diagnosis: Unsteadiness on feet (R26.81);Other abnormalities of gait and mobility (R26.89);Other symptoms and signs involving the nervous system (R29.898)   Activity Tolerance Patient tolerated treatment well(remains limited due to keeping HR below 100)   Patient Left in chair;with call bell/phone within reach;with chair alarm set   Nurse Communication Mobility status         Time: [id number] OT Time Calculation (min): 37 min  Charges: OT General Charges $OT Visit: 1 Visit OT Treatments $Self Care/Home Management : 8-22 mins  Corinne Ports E. Karlena Luebke, COTA/L Acute Rehabilitation Services [place] 05/19/2019, 3:20 PM

## 2019-05-19 NOTE — Progress Notes (Signed)
[facility] PROGRESS NOTE    Progress Note  Beth [name]  [id number] DOB: [birth year] DOA: 05/16/2019 PCP: Kathyrn Lass, MD     Brief Narrative:   Beth [name] is an 79 y.o. female past medical history significant for hypertension, depression aneurysmal intracranial hemorrhage in 2008, seizure disorder secondary to [place] chronic respiratory failure on 2 L of oxygen secondary to COPD comes in for left-sided weakness found to have a stroke  Assessment/Plan:   CVA (cerebral vascular accident) [facility]) With a history of aneurysmal intracranial hemorrhage in 2008. MRI of the brain showed right MCA infarct likely embolic to RCA proximal stenosis echocardiogram 55% Wall motion could not be delineated Carotid Doppler results are pending. Continue aspirin and statins. Physical therapy and Occupational Therapy saw the patient recommended CIR, Speech evaluation no restrictions Neurology agree with IV heparin decrease aspirin to 81 mg. Neurology consulted vascular surgery who recommended transcarotid stenting, she needs to be on Plavix for 3 days prior to this procedure and continue for at least 2 months post procedure.  Which could be done as an outpatient procedure. Neurology to recommend on what the patient can go on aspirin or Plavix, she will need to go on DOAC.  New onset atrial fibrillation: Heart rate control on diltiazem 15 continue current dose continue IV metoprolol as needed. Continue IV heparin will transition to a DOAC as an outpatient. We will have to evaluate with vascular surgery if she could be on a DOAC and aspirin as they are recommending for her to be on Plavix prior to her stenting procedure.  Seizure disorder: Continue Keppra. No seizure events.  Essential hypertension: Stable holding home meds atenolol captopril triamterene/hydrochlorothiazide for now allow permissive hypertension. Blood pressure is ranging 135/72 129/70  Hyperlipidemia: Continue statin.   Depression/anxiety: Continue Lexapro and Klonopin  Chronic respiratory failure on 2 L of oxygen due to COPD: Stable.  Low normal B12 continue oral replacement.      DVT prophylaxis: lovenox Family Communication:husband Status is: Inpatient  Remains inpatient appropriate because:Hemodynamically unstable   Dispo: The patient is from: Home              Anticipated d/c is to: CIR              Anticipated d/c date is: 3 days              Patient currently is not medically stable to d/c.  Code Status:     Code Status Orders  (From admission, onward)         Start     Ordered   05/16/19 1301  Full code  Continuous     05/16/19 1301        Code Status History    This patient has a current code status but no historical code status.   Advance Care Planning Activity    Advance Directive Documentation     Most Recent Value  Type of Advance Directive  Healthcare Power of [place]  Pre-existing out of facility DNR order (yellow form or pink MOST form)  -  "MOST" Form in Place?  -        IV Access:    Peripheral IV   Procedures and diagnostic studies:   ECHOCARDIOGRAM COMPLETE  Result Date: 05/17/2019    ECHOCARDIOGRAM REPORT   Patient Name:   Beth [name] Date of Exam: 05/17/2019 Medical Rec #:  [id number]      Height:       64.0 in  Accession #:    [id number]     Weight:       239.4 lb Date of Birth:  1940-08-11       BSA:          2.112 m Patient Age:    63 years       BP:           158/85 mmHg Patient Gender: F              HR:           86 bpm. Exam Location:  Inpatient Procedure: 2D Echo Indications:    stroke 434.91  History:        Patient has prior history of Echocardiogram examinations, most                 recent 11/04/2018. Stroke; Risk Factors:Hypertension and Former                 Smoker.  Sonographer:    Jannett Celestine RDCS (AE) Referring Phys: [id number] Mckinley Jewel  Sonographer Comments: Suboptimal parasternal window. Image acquisition challenging due to  patient body habitus and Image acquisition challenging due to respiratory motion. limited mobility (left side non-mobile) IMPRESSIONS  1. Left ventricular ejection fraction, by estimation, is 55 to 60%. The left ventricle has normal function. Left ventricular endocardial border not optimally defined to evaluate regional wall motion. Left ventricular diastolic function could not be evaluated.  2. Right ventricular systolic function was not well visualized. The right ventricular size is not well visualized.  3. The mitral valve is grossly normal. Mild mitral valve regurgitation.  4. The aortic valve was not well visualized. Aortic valve regurgitation is not visualized. No aortic stenosis is present.  5. Pulmonic valve regurgitation not well visualized. Conclusion(s)/Recommendation(s): No intracardiac source of embolism detected on this transthoracic study. A transesophageal echocardiogram is recommended to exclude cardiac source of embolism if clinically indicated. Technically challenging study. Recommend use of echo contrast for future studies. Prominent pericardial fat pad but only very small effusion. LV function globally appears normal, but cannot exclude small focal wall motion abnormalities. FINDINGS  Left Ventricle: Left ventricular ejection fraction, by estimation, is 55 to 60%. The left ventricle has normal function. Left ventricular endocardial border not optimally defined to evaluate regional wall motion. The left ventricular internal cavity size was normal in size. There is no left ventricular hypertrophy. Left ventricular diastolic function could not be evaluated. Right Ventricle: The right ventricular size is not well visualized. Right vetricular wall thickness was not assessed. Right ventricular systolic function was not well visualized. Left Atrium: Left atrial size was not well visualized. Right Atrium: Right atrial size was not well visualized. Pericardium: A small pericardial effusion is present.  Presence of pericardial fat pad. Mitral Valve: The mitral valve is grossly normal. Mild mitral annular calcification. Mild mitral valve regurgitation. Tricuspid Valve: The tricuspid valve is grossly normal. Tricuspid valve regurgitation is trivial. No evidence of tricuspid stenosis. Aortic Valve: The aortic valve was not well visualized. Aortic valve regurgitation is not visualized. No aortic stenosis is present. Pulmonic Valve: The pulmonic valve was not well visualized. Pulmonic valve regurgitation not well visualized. Aorta: The aortic root was not well visualized. Venous: The inferior vena cava was not well visualized. IAS/Shunts: The interatrial septum was not well visualized.  AORTIC VALVE LVOT Vmax:   79.20 cm/s LVOT Vmean:  50.200 cm/s LVOT VTI:    0.132 m MITRAL VALVE MV Area (PHT): 3.42 cm  SHUNTS MV Decel Time: 222 msec    Systemic VTI: 0.13 m MV E velocity: 85.70 cm/s Buford Dresser MD Electronically signed by Buford Dresser MD Signature Date/Time: 05/17/2019/7:25:16 PM    Final    VAS US CAROTID  Result Date: 05/18/2019 Carotid Arterial Duplex Study Indications:                            CVA. Limitations                             Today's exam was limited due to the high                                         bifurcation of the carotid, heavy                                         calcification and the resulting                                         shadowing and the patient's respiratory                                         variation. Comparison Study:                       No prior study Pre-Surgical Evaluation & Surgical      Stenosis at [facility] only. Right bifurcation Correlation                             is located near the mandible. Performing Technologist: Maudry Mayhew MHA, RDMS, RVT, RDCS  Examination Guidelines: A complete evaluation includes B-mode imaging, spectral Doppler, color Doppler, and power Doppler as needed of all accessible portions of each vessel.  Bilateral testing is considered an integral part of a complete examination. Limited examinations for reoccurring indications may be performed as noted.  Right Carotid Findings: +----------+--------+-------+--------+------------------------+----------------+           PSV cm/sEDV    StenosisPlaque Description      Comments                           cm/s                                                    +----------+--------+-------+--------+------------------------+----------------+ CCA Prox  44      10                                                      +----------+--------+-------+--------+------------------------+----------------+ CCA Distal32      9                                                       +----------+--------+-------+--------+------------------------+----------------+  ICA Prox  324     61     60-79%  heterogenous and        high bifurcation                                  calcific                                 +----------+--------+-------+--------+------------------------+----------------+ ICA Distal82      22                                     tortuous         +----------+--------+-------+--------+------------------------+----------------+ ECA       199     43             calcific                                 +----------+--------+-------+--------+------------------------+----------------+ +----------+--------+-------+----------------+-------------------+           PSV cm/sEDV cmsDescribe        Arm Pressure (mmHG) +----------+--------+-------+----------------+-------------------+ Subclavian101            Multiphasic, WNL                    +----------+--------+-------+----------------+-------------------+ +---------+--------+--+--------+-+---------+ VertebralPSV cm/s24EDV cm/s6Antegrade +---------+--------+--+--------+-+---------+  Left Carotid Findings:  +----------+--------+--------+--------+-------------------------+--------+           PSV cm/sEDV cm/sStenosisPlaque Description       Comments +----------+--------+--------+--------+-------------------------+--------+ CCA Prox  103     19                                                +----------+--------+--------+--------+-------------------------+--------+ CCA Distal83      21              calcific                          +----------+--------+--------+--------+-------------------------+--------+ ICA Prox  56      16              heterogenous and calcific         +----------+--------+--------+--------+-------------------------+--------+ ICA Distal51      13                                                +----------+--------+--------+--------+-------------------------+--------+ ECA       112     20                                                +----------+--------+--------+--------+-------------------------+--------+ +----------+--------+--------+----------------+-------------------+           PSV cm/sEDV cm/sDescribe        Arm Pressure (mmHG) +----------+--------+--------+----------------+-------------------+ Subclavian                Multiphasic, WNL                    +----------+--------+--------+----------------+-------------------+ +---------+--------+--------+--------------+  Experiment cm/sEDV cm/sNot identified +---------+--------+--------+--------------+   Summary: Right Carotid: Velocities in 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 Left Carotid: Velocities in the left ICA are consistent with a 1-39% stenosis. Vertebrals:  Right vertebral artery demonstrates antegrade flow. Left vertebral              artery was not visualized. Subclavians: Normal flow hemodynamics were seen in bilateral subclavian              arteries. *See table(s) above for measurements and observations.  Electronically signed by [name] MD on 05/18/2019 at 3:17:28 PM.    Final      Medical Consultants:    None.  Anti-Infectives:   none  Subjective:    Beth [name] in a good mood ready to move on.  Objective:    Vitals:   05/18/19 1900 05/18/19 2015 05/19/19 0029 05/19/19 0412  BP: 115/80 (!) 148/76 (!) 124/56 129/70  Pulse:  93 80 87  Resp: 18 19 17 19   Temp:  98.1 F (36.7 C) 98 F (36.7 C) 98.5 F (36.9 C)  TempSrc:  Oral Oral Oral  SpO2: 92% 96% 98% 97%  Weight:      Height:       SpO2: 97 % O2 Flow Rate (L/min): 2 L/min   Intake/Output Summary (Last 24 hours) at 05/19/2019 1004 Last data filed at 05/18/2019 2216 Gross per 24 hour  Intake 480 ml  Output 1450 ml  Net -970 ml   Filed Weights   05/16/19 1142  Weight: 108.6 kg    Exam: General exam: In no acute distress. Respiratory system: Good air movement and clear to auscultation. Cardiovascular system: S1 & S2 heard, RRR. No JVD. Gastrointestinal system: Abdomen is nondistended, soft and nontender.  Central nervous system: Alert and oriented.  Left-sided weakness Psychiatry: Judgement and insight appear normal.  Joyful mood   Data Reviewed:    Labs: Basic Metabolic Panel: Recent Labs  Lab 05/16/19 1030 05/16/19 1030 05/16/19 1134 05/16/19 1134 05/17/19 0346 05/18/19 0503  NA 137  --  138  --  133* 138  K 3.9   < > 3.8   < > 3.7 3.7  CL 102  --  100  --  100 103  CO2 24  --   --   --  26 26  GLUCOSE 155*  --  147*  --  116* 140*  BUN 9  --  11  --  8 7*  CREATININE 0.79  --  0.70  --  0.74 0.66  CALCIUM 8.9  --   --   --  8.8* 9.0   < > = values in this interval not displayed.   GFR Estimated Creatinine Clearance: 70.9 mL/min (by C-G formula based on SCr of 0.66 mg/dL). Liver Function Tests: Recent Labs  Lab 05/16/19 1030  AST 22  ALT 18  ALKPHOS 39  BILITOT 0.4  PROT 7.0  ALBUMIN 3.6   No results for input(s): LIPASE, AMYLASE in the last 168 hours. No results for input(s): AMMONIA in the last  168 hours. Coagulation profile Recent Labs  Lab 05/16/19 1030  INR 0.9   COVID-19 Labs  No results for input(s): DDIMER, FERRITIN, LDH, CRP in the last 72 hours.  Lab Results  Component Value Date   [place] NEGATIVE 05/16/2019    CBC: Recent Labs  Lab 05/16/19 1030 05/16/19  1134 05/16/19 1839 05/17/19 0346 05/18/19 0503  WBC 10.2  --   --  12.5* 10.5  NEUTROABS 6.6  --   --  9.0*  --   HGB 14.2 14.6  --  12.9 14.0  HCT 43.4 43.0 40.3 37.7 41.7  MCV 100.9*  --   --  96.9 97.2  PLT 274  --   --  247 248   Cardiac Enzymes: No results for input(s): CKTOTAL, CKMB, CKMBINDEX, TROPONINI in the last 168 hours. BNP (last 3 results) No results for input(s): PROBNP in the last 8760 hours. CBG: Recent Labs  Lab 05/16/19 1128  GLUCAP 158*   D-Dimer: No results for input(s): DDIMER in the last 72 hours. Hgb A1c: Recent Labs    05/17/19 0346  HGBA1C 6.0*   Lipid Profile: Recent Labs    05/17/19 0346  CHOL 151  HDL 39*  LDLCALC 78  TRIG 172*  CHOLHDL 3.9   Thyroid function studies: Recent Labs    05/16/19 1839  TSH 1.737   Anemia work up: Recent Labs    05/16/19 1839  VITAMINB12 280   Sepsis Labs: Recent Labs  Lab 05/16/19 1030 05/17/19 0346 05/18/19 0503  WBC 10.2 12.5* 10.5   Microbiology Recent Results (from the past 240 hour(s))  Respiratory Panel by RT PCR (Flu A&B, Covid) - Nasopharyngeal Swab     Status: None   Collection Time: 05/16/19  2:25 PM   Specimen: Nasopharyngeal Swab  Result Value Ref Range Status   SARS Coronavirus 2 by RT PCR NEGATIVE NEGATIVE Final    Comment: (NOTE) SARS-CoV-2 target nucleic acids are NOT DETECTED. The SARS-CoV-2 RNA is generally detectable in upper respiratoy specimens during the acute phase of infection. The lowest concentration of SARS-CoV-2 viral copies this assay can detect is 131 copies/mL. A negative result does not preclude SARS-Cov-2 infection and should not be used as the sole basis for  treatment or other patient management decisions. A negative result may occur with  improper specimen collection/handling, submission of specimen other than nasopharyngeal swab, presence of viral mutation(s) within the areas targeted by this assay, and inadequate number of viral copies (<131 copies/mL). A negative result must be combined with clinical observations, patient history, and epidemiological information. The expected result is Negative. Fact Sheet for Patients:  PinkCheek.be Fact Sheet for Healthcare Providers:  GravelBags.it This test is not yet ap proved or cleared by the [place] FDA and  has been authorized for detection and/or diagnosis of SARS-CoV-2 by FDA under an Emergency Use Authorization (EUA). This EUA will remain  in effect (meaning this test can be used) for the duration of the COVID-19 declaration under Section 564(b)(1) of the Act, 21 U.S.C. section 360bbb-3(b)(1), unless the authorization is terminated or revoked sooner.    Influenza A by PCR NEGATIVE NEGATIVE Final   Influenza B by PCR NEGATIVE NEGATIVE Final    Comment: (NOTE) The Xpert Xpress SARS-CoV-2/FLU/RSV assay is intended as an aid in  the diagnosis of influenza from Nasopharyngeal swab specimens and  should not be used as a sole basis for treatment. Nasal washings and  aspirates are unacceptable for Xpert Xpress SARS-CoV-2/FLU/RSV  testing. Fact Sheet for Patients: PinkCheek.be Fact Sheet for Healthcare Providers: GravelBags.it This test is not yet approved or cleared by the [place] FDA and  has been authorized for detection and/or diagnosis of SARS-CoV-2 by  FDA under an Emergency Use Authorization (EUA). This EUA will remain  in effect (meaning this test can be used)  for the duration of the  Covid-19 declaration under Section 564(b)(1) of the Act, 21  U.S.C. section  360bbb-3(b)(1), unless the authorization is  terminated or revoked. Performed at [facility], [place] [address], [place], [place] [address]      Medications:   . aspirin EC  325 mg Oral Daily  . atorvastatin  40 mg Oral Daily  . cholecalciferol  2,000 Units Oral Daily  . clonazePAM  2 mg Oral QHS  . diclofenac Sodium  2 g Topical QID  . escitalopram  20 mg Oral Daily  . levETIRAcetam  750 mg Oral BID  . vitamin B-12  500 mcg Oral Daily   Continuous Infusions: . diltiazem (CARDIZEM) infusion 15 mg/hr (05/19/19 0044)      LOS: 3 days   Charlynne Cousins  [facility]  05/19/2019, 10:04 AM

## 2019-05-20 DIAGNOSIS — I6521 Occlusion and stenosis of right carotid artery: Secondary | ICD-10-CM

## 2019-05-20 DIAGNOSIS — J410 Simple chronic bronchitis: Secondary | ICD-10-CM

## 2019-05-20 LAB — CBC
HCT: 38.3 % (ref 36.0–46.0)
Hemoglobin: 12.8 g/dL (ref 12.0–15.0)
MCH: 32.5 pg (ref 26.0–34.0)
MCHC: 33.4 g/dL (ref 30.0–36.0)
MCV: 97.2 fL (ref 80.0–100.0)
Platelets: 251 10*3/uL (ref 150–400)
RBC: 3.94 MIL/uL (ref 3.87–5.11)
RDW: 12.2 % (ref 11.5–15.5)
WBC: 12.4 10*3/uL — ABNORMAL HIGH (ref 4.0–10.5)
nRBC: 0 % (ref 0.0–0.2)

## 2019-05-20 LAB — HEPARIN LEVEL (UNFRACTIONATED): Heparin Unfractionated: 0.66 IU/mL (ref 0.30–0.70)

## 2019-05-20 MED ORDER — DILTIAZEM HCL ER COATED BEADS 180 MG PO CP24
300.0000 mg | ORAL_CAPSULE | Freq: Every day | ORAL | Status: DC
  Administered 2019-05-20 – 2019-05-24 (×5): 300 mg via ORAL
  Filled 2019-05-20 (×5): qty 1

## 2019-05-20 MED ORDER — DILTIAZEM HCL-DEXTROSE 125-5 MG/125ML-% IV SOLN (PREMIX)
5.0000 mg/h | INTRAVENOUS | Status: AC
  Filled 2019-05-20: qty 125

## 2019-05-20 MED ORDER — ARFORMOTEROL TARTRATE 15 MCG/2ML IN NEBU
15.0000 ug | INHALATION_SOLUTION | Freq: Two times a day (BID) | RESPIRATORY_TRACT | Status: DC
  Administered 2019-05-20 – 2019-05-21 (×3): 15 ug via RESPIRATORY_TRACT
  Filled 2019-05-20 (×3): qty 2

## 2019-05-20 MED ORDER — UMECLIDINIUM BROMIDE 62.5 MCG/INH IN AEPB
1.0000 | INHALATION_SPRAY | Freq: Every day | RESPIRATORY_TRACT | Status: DC
  Filled 2019-05-20: qty 7

## 2019-05-20 MED ORDER — CLOPIDOGREL BISULFATE 75 MG PO TABS
75.0000 mg | ORAL_TABLET | Freq: Every day | ORAL | Status: DC
  Administered 2019-05-20 – 2019-05-24 (×5): 75 mg via ORAL
  Filled 2019-05-20 (×5): qty 1

## 2019-05-20 MED ORDER — APIXABAN 5 MG PO TABS
5.0000 mg | ORAL_TABLET | Freq: Two times a day (BID) | ORAL | Status: DC
  Administered 2019-05-20 – 2019-05-24 (×9): 5 mg via ORAL
  Filled 2019-05-20 (×9): qty 1

## 2019-05-20 MED ORDER — PANTOPRAZOLE SODIUM 40 MG PO TBEC
40.0000 mg | DELAYED_RELEASE_TABLET | Freq: Two times a day (BID) | ORAL | Status: DC
  Administered 2019-05-20 – 2019-05-24 (×9): 40 mg via ORAL
  Filled 2019-05-20 (×9): qty 1

## 2019-05-20 MED ORDER — POLYETHYLENE GLYCOL 3350 17 G PO PACK
17.0000 g | PACK | Freq: Every day | ORAL | Status: DC
  Administered 2019-05-22 – 2019-05-24 (×3): 17 g via ORAL
  Filled 2019-05-20 (×4): qty 1

## 2019-05-20 NOTE — Progress Notes (Signed)
Inpatient Rehabilitation Admissions Coordinator  I await clarification of timing of TCAR as well as rate control off IV diltiazem before I could pursue CIR admit. I will follow up on Monday.  Danne Baxter, RN, MSN Rehab Admissions Coordinator [phone] 05/20/2019 11:56 AM

## 2019-05-20 NOTE — Progress Notes (Addendum)
  Progress Note  VASCULAR SURGERY ASSESSMENT & PLAN:   SYMPTOMATIC RIGHT CAROTID STENOSIS: The patient is scheduled for transcarotid stenting (TCAR) on 06/07/2019.  She can go home from our standpoint and we can bring her back electively.  She will have to be off of her DOAC for 48 hours preoperatively.  She needs to be on Plavix starting 72 hours preop.  I will follow up this weekend and discussed this with her.  Beth Mayo, MD Office: [phone]   05/20/2019 8:04 AM * No surgery found *  Subjective: not in any distress. No new neurological symptoms. States some concerns about her discharge and if she will go to rehab. States she does not feel that she can go home and get the care that she needs   Vitals:   05/20/19 0348 05/20/19 0700  BP: (!) 163/97 (!) 161/76  Pulse: 98 73  Resp: 18 18  Temp: 98.6 F (37 C) 98.3 F (36.8 C)  SpO2: 97% 99%   Physical Exam: General: well appearing, well nourished, not in distress Lungs: mildly labored on [place] Extremities: well perfused and warm Abdomen:  Obese, soft, non tender Neurologic: alert and oriented. Beth [name] intact. Does have continued weakness of left arm and mild left lower extremity weakness  CBC    Component Value Date/Time   WBC 12.4 (H) 05/20/2019 0421   RBC 3.94 05/20/2019 0421   HGB 12.8 05/20/2019 0421   HGB 13.5 02/22/2018 1051   HCT 38.3 05/20/2019 0421   HCT 40.3 05/16/2019 1839   PLT 251 05/20/2019 0421   PLT 233 02/22/2018 1051   MCV 97.2 05/20/2019 0421   MCH 32.5 05/20/2019 0421   MCHC 33.4 05/20/2019 0421   RDW 12.2 05/20/2019 0421   LYMPHSABS 2.2 05/17/2019 0346   MONOABS 1.2 (H) 05/17/2019 0346   EOSABS 0.1 05/17/2019 0346   BASOSABS 0.0 05/17/2019 0346    BMET    Component Value Date/Time   NA 138 05/18/2019 0503   K 3.7 05/18/2019 0503   CL 103 05/18/2019 0503   CO2 26 05/18/2019 0503   GLUCOSE 140 (H) 05/18/2019 0503   BUN 7 (L) 05/18/2019 0503   CREATININE 0.66 05/18/2019 0503   CREATININE  0.85 02/22/2018 1051   CALCIUM 9.0 05/18/2019 0503   GFRNONAA >60 05/18/2019 0503   GFRNONAA >60 02/22/2018 1051   GFRAA >60 05/18/2019 0503   GFRAA >60 02/22/2018 1051    INR    Component Value Date/Time   INR 0.9 05/16/2019 1030     Intake/Output Summary (Last 24 hours) at 05/20/2019 0804 Last data filed at 05/19/2019 1651 Gross per 24 hour  Intake 505.82 ml  Output 1350 ml  Net -844.18 ml     Assessment/Plan:  79 y.o. female with 60-79% right ICA stenosis on duplex. Right MCA stroke. Felt to be candidate for TCAR. Per neurology feel that she is okay to be on DOAC so will start Plavix at time of discharge. On Heparin gtt for new onset Afib.  She should stay on Aspirin 81 mg and statin. Timing of TCAR to be determined. Dr. Scot Dock Dr. Donzetta Matters will follow up regarding timing of surgery   Beth Caldwell, PA-C [facility] [phone] 05/20/2019 8:04 AM

## 2019-05-20 NOTE — Progress Notes (Signed)
Physical Therapy Treatment Patient Details Name: Beth [name] MRN: [id number] DOB: 02-20-1940 Today's Date: 05/20/2019    History of Present Illness Pt is a 79 yo female admitted with sudden onset L side weakness, difficulty walking. 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  Pt with COPD and on home O2.    PT Comments    Patient continues to make steady progress toward PT goals and tolerated mobility well. Continue to recommend CIR for further skilled PT services to maximize independence and safety with mobility.    Follow Up Recommendations  CIR;Supervision/Assistance - 24 hour     Equipment Recommendations  (TBD at next venue)    Recommendations for Other Services Rehab consult     Precautions / Restrictions Precautions Precautions: Fall Precaution Comments: L inattention; maintain HR <100  Restrictions Weight Bearing Restrictions: No    Mobility  Bed Mobility Overal bed mobility: Needs Assistance Bed Mobility: Supine to Sit     Supine to sit: HOB elevated;Min guard     General bed mobility comments: use of rail and cues to protect L UE   Transfers Overall transfer level: Needs assistance Equipment used: Rolling walker (2 wheeled);2 person hand held assist Transfers: Sit to/from [facility] Sit to Stand: Mod assist;Min assist Stand pivot transfers: Mod assist;+2 safety/equipment       General transfer comment: pt stood from EOB, SPT to [facility] with +2 HHA, and then stood from [facility] with RW; assistance required to power up into standing, balance/weight shifting, and L grip on RW  Ambulation/Gait Ambulation/Gait assistance: Mod assist;+2 safety/equipment;+2 physical assistance Gait Distance (Feet): 6 Feet Assistive device: Rolling walker (2 wheeled) Gait Pattern/deviations: Step-through pattern;Decreased weight shift to right;Decreased dorsiflexion - left;Decreased step length - right;Decreased step length  - left;Decreased stance time - left     General Gait Details: cues for sequencing and multimodal cues and assist to weight shift; assist required for balance and managing RW    Stairs             Wheelchair Mobility    Modified Rankin (Stroke Patients Only) Modified Rankin (Stroke Patients Only) Pre-Morbid Rankin Score: No symptoms Modified Rankin: Moderately severe disability     Balance Overall balance assessment: Needs assistance Sitting-balance support: Feet supported Sitting balance-Leahy Scale: Fair     Standing balance support: Bilateral upper extremity supported;During functional activity Standing balance-Leahy Scale: Poor                              Cognition Arousal/Alertness: Awake/alert Behavior During Therapy: WFL for tasks assessed/performed Overall Cognitive Status: Within Functional Limits for tasks assessed                                 General Comments: L inattention      Exercises      General Comments        Pertinent Vitals/Pain Pain Assessment: No/denies pain    Home Living                      Prior Function            PT Goals (current goals can now be found in the care plan section) Progress towards PT goals: Progressing toward goals    Frequency    Min 4X/week  PT Plan Current plan remains appropriate    Co-evaluation              AM-PAC PT "6 Clicks" Mobility   Outcome Measure  Help needed turning from your back to your side while in a flat bed without using bedrails?: A Lot Help needed moving from lying on your back to sitting on the side of a flat bed without using bedrails?: A Lot Help needed moving to and from a bed to a chair (including a wheelchair)?: A Lot Help needed standing up from a chair using your arms (e.g., wheelchair or bedside chair)?: A Little Help needed to walk in hospital room?: A Lot Help needed climbing 3-5 steps with a railing? : Total 6  Click Score: 12    End of Session Equipment Utilized During Treatment: Oxygen;Gait belt Activity Tolerance: Patient tolerated treatment well Patient left: with call bell/phone within reach;in chair;with chair alarm set Nurse Communication: Mobility status PT Visit Diagnosis: Hemiplegia and hemiparesis Hemiplegia - Right/Left: Left Hemiplegia - dominant/non-dominant: Non-dominant Hemiplegia - caused by: Cerebral infarction     Time: 1347-1416 PT Time Calculation (min) (ACUTE ONLY): 29 min  Charges:  $Gait Training: 23-37 mins                     Earney Navy, PTA Acute Rehabilitation Services Pager: [phone] Office: [phone]     Darliss Cheney 05/20/2019, 4:04 PM

## 2019-05-20 NOTE — Progress Notes (Signed)
[facility] PROGRESS NOTE    Progress Note  Beth [name]  [id number] DOB: 1940/02/27 DOA: 05/16/2019 PCP: Kathyrn Lass, MD     Brief Narrative:   Beth [name] is an 79 y.o. female past medical history significant for hypertension, depression aneurysmal intracranial hemorrhage in 2008, seizure disorder secondary to [place] chronic respiratory failure on 2 L of oxygen secondary to COPD comes in for left-sided weakness found to have a stroke  Assessment/Plan:   CVA (cerebral vascular accident) ([place]) We will transition IV heparin to [place]. Discontinue aspirin continue Plavix she needs to on Plavix 72 hours prior to procedure. His spoken to vascular surgery she needs to be off the DOAC for at least 48 hours prior to the procedure.  New onset atrial fibrillation: Rate control on IV diltiazem we will transition to oral diltiazem continue IV metoprolol as needed. We will discuss with neurology whether she needs to be on aspirin Plavix and a DOAC as an outpatient, this would put her at high risk of bleeding.  Seizure disorder: Continue Keppra. No seizure events.  Essential hypertension: Stable holding home meds atenolol captopril triamterene/hydrochlorothiazide for now allow permissive hypertension. Blood pressure is ranging 135/72 129/70  Hyperlipidemia: Continue statin.  Depression/anxiety: Continue Lexapro and Klonopin  Chronic respiratory failure on 2 L of oxygen due to COPD: Stable.  Low normal B12 continue oral replacement.      DVT prophylaxis: lovenox Family Communication:husband Status is: Inpatient  Remains inpatient appropriate because:Hemodynamically unstable   Dispo: The patient is from: Home              Anticipated d/c is to: CIR              Anticipated d/c date is: 1 days              Patient currently is  medically stable to d/c.  Code Status:     Code Status Orders  (From admission, onward)         Start     Ordered   05/16/19 1301   Full code  Continuous     05/16/19 1301        Code Status History    This patient has a current code status but no historical code status.   Advance Care Planning Activity    Advance Directive Documentation     Most Recent Value  Type of Advance Directive  Healthcare Power of Attorney  Pre-existing out of facility DNR order (yellow form or pink MOST form)  --  "MOST" Form in Place?  --        IV Access:    Peripheral IV   Procedures and diagnostic studies:   VAS US CAROTID  Result Date: 05/18/2019 Carotid Arterial Duplex Study Indications:                            CVA. Limitations                             Today's exam was limited due to the high                                         bifurcation of the carotid, heavy  calcification and the resulting                                         shadowing and the patient's respiratory                                         variation. Comparison Study:                       No prior study Pre-Surgical Evaluation & Surgical      Stenosis at [facility] only. Right bifurcation Correlation                             is located near the mandible. Performing Technologist: Maudry Mayhew MHA, RDMS, RVT, RDCS  Examination Guidelines: A complete evaluation includes B-mode imaging, spectral Doppler, color Doppler, and power Doppler as needed of all accessible portions of each vessel. Bilateral testing is considered an integral part of a complete examination. Limited examinations for reoccurring indications may be performed as noted.  Right Carotid Findings: +----------+--------+-------+--------+------------------------+----------------+           PSV cm/sEDV    StenosisPlaque Description      Comments                           cm/s                                                    +----------+--------+-------+--------+------------------------+----------------+ CCA Prox  44      10                                                       +----------+--------+-------+--------+------------------------+----------------+ CCA Distal32      9                                                       +----------+--------+-------+--------+------------------------+----------------+ ICA Prox  324     61     60-79%  heterogenous and        high bifurcation                                  calcific                                 +----------+--------+-------+--------+------------------------+----------------+ ICA Distal82      22                                     tortuous         +----------+--------+-------+--------+------------------------+----------------+  ECA       199     43             calcific                                 +----------+--------+-------+--------+------------------------+----------------+ +----------+--------+-------+----------------+-------------------+           PSV cm/sEDV cmsDescribe        Arm Pressure (mmHG) +----------+--------+-------+----------------+-------------------+ Subclavian101            Multiphasic, WNL                    +----------+--------+-------+----------------+-------------------+ +---------+--------+--+--------+-+---------+ VertebralPSV cm/s24EDV cm/s6Antegrade +---------+--------+--+--------+-+---------+  Left Carotid Findings: +----------+--------+--------+--------+-------------------------+--------+           PSV cm/sEDV cm/sStenosisPlaque Description       Comments +----------+--------+--------+--------+-------------------------+--------+ CCA Prox  103     19                                                +----------+--------+--------+--------+-------------------------+--------+ CCA Distal83      21              calcific                          +----------+--------+--------+--------+-------------------------+--------+ ICA Prox  56      16              heterogenous and calcific          +----------+--------+--------+--------+-------------------------+--------+ ICA Distal51      13                                                +----------+--------+--------+--------+-------------------------+--------+ ECA       112     20                                                +----------+--------+--------+--------+-------------------------+--------+ +----------+--------+--------+----------------+-------------------+           PSV cm/sEDV cm/sDescribe        Arm Pressure (mmHG) +----------+--------+--------+----------------+-------------------+ Subclavian                Multiphasic, WNL                    +----------+--------+--------+----------------+-------------------+ +---------+--------+--------+--------------+ VertebralPSV cm/sEDV cm/sNot identified +---------+--------+--------+--------------+   Summary: Right Carotid: Velocities in 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 Left Carotid: Velocities in the left ICA are consistent with a 1-39% stenosis. Vertebrals:  Right vertebral artery demonstrates antegrade flow. Left vertebral              artery was not visualized. Subclavians: Normal flow hemodynamics were seen in bilateral subclavian              arteries. *See table(s) above for measurements and observations.  Electronically signed by [name] MD on 05/18/2019 at 3:17:28 PM.    Final      Medical  Consultants:    None.  Anti-Infectives:   none  Subjective:    Beth [name] no new complaints feels great.  Objective:    Vitals:   05/19/19 2013 05/19/19 2346 05/20/19 0348 05/20/19 0700  BP: (!) 133/93 (!) 172/85 (!) 163/97 (!) 161/76  Pulse: 86 88 98 73  Resp: 18 19 18 18   Temp: 98.4 F (36.9 C) 97.8 F (36.6 C) 98.6 F (37 C) 98.3 F (36.8 C)  TempSrc: Oral  Oral Oral  SpO2: 94% 96% 97% 99%  Weight:      Height:       SpO2: 99 % O2 Flow Rate (L/min): 2 L/min   Intake/Output Summary (Last 24  hours) at 05/20/2019 0846 Last data filed at 05/19/2019 1651 Gross per 24 hour  Intake 505.82 ml  Output 1350 ml  Net -844.18 ml   Filed Weights   05/16/19 1142  Weight: 108.6 kg    Exam: General exam: In no acute distress. Respiratory system: Good air movement and clear to auscultation. Cardiovascular system: S1 & S2 heard, RRR. No JVD. Gastrointestinal system: Abdomen is nondistended, soft and nontender.  Central nervous system: Alert and oriented.  Extremities: No pedal edema. Skin: No rashes, lesions or ulcers  Data Reviewed:    Labs: Basic Metabolic Panel: Recent Labs  Lab 05/16/19 1030 05/16/19 1030 05/16/19 1134 05/16/19 1134 05/17/19 0346 05/18/19 0503  NA 137  --  138  --  133* 138  K 3.9   < > 3.8   < > 3.7 3.7  CL 102  --  100  --  100 103  CO2 24  --   --   --  26 26  GLUCOSE 155*  --  147*  --  116* 140*  BUN 9  --  11  --  8 7*  CREATININE 0.79  --  0.70  --  0.74 0.66  CALCIUM 8.9  --   --   --  8.8* 9.0   < > = values in this interval not displayed.   GFR Estimated Creatinine Clearance: 70.9 mL/min (by C-G formula based on SCr of 0.66 mg/dL). Liver Function Tests: Recent Labs  Lab 05/16/19 1030  AST 22  ALT 18  ALKPHOS 39  BILITOT 0.4  PROT 7.0  ALBUMIN 3.6   No results for input(s): LIPASE, AMYLASE in the last 168 hours. No results for input(s): AMMONIA in the last 168 hours. Coagulation profile Recent Labs  Lab 05/16/19 1030  INR 0.9   COVID-19 Labs  No results for input(s): DDIMER, FERRITIN, LDH, CRP in the last 72 hours.  Lab Results  Component Value Date   [place] NEGATIVE 05/16/2019    CBC: Recent Labs  Lab 05/16/19 1030 05/16/19 1030 05/16/19 1134 05/16/19 1839 05/17/19 0346 05/18/19 0503 05/20/19 0421  WBC 10.2  --   --   --  12.5* 10.5 12.4*  NEUTROABS 6.6  --   --   --  9.0*  --   --   HGB 14.2  --  14.6  --  12.9 14.0 12.8  HCT 43.4   < > 43.0 40.3 37.7 41.7 38.3  MCV 100.9*  --   --   --  96.9 97.2  97.2  PLT 274  --   --   --  247 248 251   < > = values in this interval not displayed.   Cardiac Enzymes: No results for input(s): CKTOTAL, CKMB, CKMBINDEX, TROPONINI in the last 168 hours.  BNP (last 3 results) No results for input(s): PROBNP in the last 8760 hours. CBG: Recent Labs  Lab 05/16/19 1128  GLUCAP 158*   D-Dimer: No results for input(s): DDIMER in the last 72 hours. Hgb A1c: No results for input(s): HGBA1C in the last 72 hours. Lipid Profile: No results for input(s): CHOL, HDL, LDLCALC, TRIG, CHOLHDL, LDLDIRECT in the last 72 hours. Thyroid function studies: No results for input(s): TSH, T4TOTAL, T3FREE, THYROIDAB in the last 72 hours.  Invalid input(s): FREET3 Anemia work up: No results for input(s): VITAMINB12, FOLATE, FERRITIN, TIBC, IRON, RETICCTPCT in the last 72 hours. Sepsis Labs: Recent Labs  Lab 05/16/19 1030 05/17/19 0346 05/18/19 0503 05/20/19 0421  WBC 10.2 12.5* 10.5 12.4*   Microbiology Recent Results (from the past 240 hour(s))  Respiratory Panel by RT PCR (Flu A&B, Covid) - Nasopharyngeal Swab     Status: None   Collection Time: 05/16/19  2:25 PM   Specimen: Nasopharyngeal Swab  Result Value Ref Range Status   SARS Coronavirus 2 by RT PCR NEGATIVE NEGATIVE Final    Comment: (NOTE) SARS-CoV-2 target nucleic acids are NOT DETECTED. The SARS-CoV-2 RNA is generally detectable in upper respiratoy specimens during the acute phase of infection. The lowest concentration of SARS-CoV-2 viral copies this assay can detect is 131 copies/mL. A negative result does not preclude SARS-Cov-2 infection and should not be used as the sole basis for treatment or other patient management decisions. A negative result may occur with  improper specimen collection/handling, submission of specimen other than nasopharyngeal swab, presence of viral mutation(s) within the areas targeted by this assay, and inadequate number of viral copies (<131 copies/mL). A  negative result must be combined with clinical observations, patient history, and epidemiological information. The expected result is Negative. Fact Sheet for Patients:  PinkCheek.be Fact Sheet for Healthcare Providers:  GravelBags.it This test is not yet ap proved or cleared by the [place] FDA and  has been authorized for detection and/or diagnosis of SARS-CoV-2 by FDA under an Emergency Use Authorization (EUA). This EUA will remain  in effect (meaning this test can be used) for the duration of the COVID-19 declaration under Section 564(b)(1) of the Act, 21 U.S.C. section 360bbb-3(b)(1), unless the authorization is terminated or revoked sooner.    Influenza A by PCR NEGATIVE NEGATIVE Final   Influenza B by PCR NEGATIVE NEGATIVE Final    Comment: (NOTE) The Xpert Xpress SARS-CoV-2/FLU/RSV assay is intended as an aid in  the diagnosis of influenza from Nasopharyngeal swab specimens and  should not be used as a sole basis for treatment. Nasal washings and  aspirates are unacceptable for Xpert Xpress SARS-CoV-2/FLU/RSV  testing. Fact Sheet for Patients: PinkCheek.be Fact Sheet for Healthcare Providers: GravelBags.it This test is not yet approved or cleared by the [place] FDA and  has been authorized for detection and/or diagnosis of SARS-CoV-2 by  FDA under an Emergency Use Authorization (EUA). This EUA will remain  in effect (meaning this test can be used) for the duration of the  Covid-19 declaration under Section 564(b)(1) of the Act, 21  U.S.C. section 360bbb-3(b)(1), unless the authorization is  terminated or revoked. Performed at [facility], [place] [address], [place], [place] [address]      Medications:   . atorvastatin  40 mg Oral Daily  . cholecalciferol  2,000 Units Oral Daily  . clonazePAM  2 mg Oral QHS  . clopidogrel  75 mg Oral  Daily  . escitalopram  20 mg  Oral Daily  . levETIRAcetam  750 mg Oral BID  . pantoprazole  40 mg Oral BID  . vitamin B-12  500 mcg Oral Daily   Continuous Infusions: . diltiazem (CARDIZEM) infusion 15 mg/hr (05/19/19 1715)  . heparin 1,200 Units/hr (05/20/19 0814)      LOS: 4 days   [facility]  05/20/2019, 8:46 AM

## 2019-05-20 NOTE — Progress Notes (Signed)
ANTICOAGULATION CONSULT NOTE - Follow Up Consult  Pharmacy Consult for heparin Indication: atrial fibrillation/CVA  Allergies  Allergen Reactions  . Prozac [Fluoxetine Hcl] Other (See Comments)    seizures  . Sulfa Antibiotics Nausea And Vomiting and Other (See Comments)    TINGLING IN LEGS ALSO  . Micardis [Telmisartan] Swelling and Other (See Comments)    Swelling, bruising Swelling, bruising  . Prednisone Other (See Comments)    mood changes Mood changes  . Lisinopril-Hydrochlorothiazide Other (See Comments)    unknown  . Augmentin [Amoxicillin-Pot Clavulanate] Rash  . Avelox [Moxifloxacin Hcl In Nacl] Rash    LEVAQUIN IS OK-SEE NOTE  . Norvasc [Amlodipine Besylate] Other (See Comments)    fatigue    Patient Measurements: Height: '5\' 4"'  (162.6 cm) Weight: 108.6 kg (239 lb 6.7 oz) IBW/kg (Calculated) : 54.7 Heparin Dosing Weight: 80 kg  Vital Signs: Temp: 98.6 F (37 C) (05/07 0348) Temp Source: Oral (05/07 0348) BP: 163/97 (05/07 0348) Pulse Rate: 98 (05/07 0348)  Labs: Recent Labs    05/18/19 0503 05/19/19 1825 05/20/19 0421  HGB 14.0  --  12.8  HCT 41.7  --  38.3  PLT 248  --  251  HEPARINUNFRC  --  <0.10* 0.66  CREATININE 0.66  --   --     Estimated Creatinine Clearance: 70.9 mL/min (by C-G formula based on SCr of 0.66 mg/dL).   Medical History: Past Medical History:  Diagnosis Date  . BRCA2 positive 2017  . Diverticulosis   . Hearing loss   . HTN (hypertension)   . Memory loss   . 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 Seizures ([place])   . 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   Elwyn Reach of ear     Assessment: 79 yr old female presented on 05/16/19 with left-sided weakness. She has a hx of ICH in the past. She was dx with new CVA with afib. IV heparin has been ordered for anticoagulation. We will use a lower goal (with no boluses), due to risk of hemorrhagic transformation. Pt  was on no anticoagulation PTA.  CBC WNL; aPTT 28 sec, INR 0.9  5/7 AM update:  Heparin level above goal this AM  No issues per RN  Goal of Therapy:  Heparin level: 0.3-0.5 units/ml Monitor platelets by anticoagulation protocol: Yes   Plan:  Dec heparin to 1200 units/hr 1400 heparin level  Narda Bonds, PharmD, BCPS Clinical Pharmacist Phone: [phone]

## 2019-05-20 NOTE — Progress Notes (Addendum)
ANTICOAGULATION CONSULT NOTE - follow up Pharmacy Consult for apixaban Indication: atrial fibrillation/CVA  Allergies  Allergen Reactions  . Prozac [Fluoxetine Hcl] Other (See Comments)    seizures  . Sulfa Antibiotics Nausea And Vomiting and Other (See Comments)    TINGLING IN LEGS ALSO  . Micardis [Telmisartan] Swelling and Other (See Comments)    Swelling, bruising Swelling, bruising  . Prednisone Other (See Comments)    mood changes Mood changes  . Lisinopril-Hydrochlorothiazide Other (See Comments)    unknown  . Augmentin [Amoxicillin-Pot Clavulanate] Rash  . Avelox [Moxifloxacin Hcl In Nacl] Rash    LEVAQUIN IS OK-SEE NOTE  . Norvasc [Amlodipine Besylate] Other (See Comments)    fatigue    Patient Measurements: Height: '5\' 4"'$  (162.6 cm) Weight: 108.6 kg (239 lb 6.7 oz) IBW/kg (Calculated) : 54.7 Heparin Dosing Weight: 80kg  Vital Signs: Temp: 98.3 F (36.8 C) (05/07 0700) Temp Source: Oral (05/07 0700) BP: 161/76 (05/07 0700) Pulse Rate: 73 (05/07 0700)  Labs: Recent Labs    05/18/19 0503 05/19/19 1825 05/20/19 0421  HGB 14.0  --  12.8  HCT 41.7  --  38.3  PLT 248  --  251  HEPARINUNFRC  --  <0.10* 0.66  CREATININE 0.66  --   --     Estimated Creatinine Clearance: 70.9 mL/min (by C-G formula based on SCr of 0.66 mg/dL).   Medical History: Past Medical History:  Diagnosis Date  . BRCA2 positive 2017  . Diverticulosis   . Hearing loss   . HTN (hypertension)   . Memory loss   . Seizure disorder, complex partial, 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[place]) seizure free for one year  . Seizures ([place])   . 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   . Vestibulitis of ear     Medications:  Medications Prior to Admission  Medication Sig Dispense Refill Last Dose  . albuterol (PROVENTIL HFA;VENTOLIN HFA) 108 (90 Base) MCG/ACT inhaler Inhale 2 puffs into the lungs every 4 (four) hours as needed for wheezing or shortness of breath. 1 Inhaler 5 Past Week at Unknown  time  . aspirin EC 81 MG tablet Take 81 mg by mouth daily.   05/15/2019 at Unknown time  . atenolol (TENORMIN) 50 MG tablet Take 25 mg by mouth 2 (two) times daily.    05/16/2019 at 9;45am  . captopril (CAPOTEN) 50 MG tablet Take 50 mg by mouth 2 (two) times daily.   05/16/2019 at Unknown time  . Cholecalciferol (VITAMIN D3) 2000 units capsule 1 tablet by mouth daily   05/15/2019 at Unknown time  . clonazePAM (KLONOPIN) 2 MG tablet TAKE 1 TABLET AT BEDTIME (Patient taking differently: Take 1 mg by mouth daily. For seizure activity or insomnia dohmeier.) 90 tablet 0 05/16/2019 at Unknown time  . fluticasone (FLONASE) 50 MCG/ACT nasal spray Place 1 spray into the nose daily as needed for allergies. Spray twice daily as needed    05/15/2019 at Unknown time  . levETIRAcetam 750 MG TB3D Take 750 mg by mouth 2 (two) times daily. 180 tablet 0 05/16/2019 at Unknown time  . LEXAPRO 20 MG tablet Take 1 tablet (20 mg total) by mouth daily. Morning dosage 90 tablet 3 05/15/2019 at Unknown time  . simvastatin (ZOCOR) 10 MG tablet Take 10 mg by mouth at bedtime.   05/15/2019 at Unknown time  . Tiotropium Bromide-Olodaterol 2.5-2.5 MCG/ACT AERS Inhale 2 puffs into the lungs daily. 4 g 3 05/16/2019 at Unknown time  . triamterene-hydrochlorothiazide (MAXZIDE-25) 37.5-25 MG tablet Take  1 tablet by mouth daily.   05/15/2019 at Unknown time   Scheduled:  . apixaban  5 mg Oral BID  . atorvastatin  40 mg Oral Daily  . cholecalciferol  2,000 Units Oral Daily  . clonazePAM  2 mg Oral QHS  . clopidogrel  75 mg Oral Daily  . diltiazem  300 mg Oral Daily  . escitalopram  20 mg Oral Daily  . levETIRAcetam  750 mg Oral BID  . pantoprazole  40 mg Oral BID  . vitamin B-12  500 mcg Oral Daily   Infusions:  . diltiazem (CARDIZEM) infusion      Assessment: Pt presented with left sided weakness. She has a hx of ICH in the past. She was dx with new CVA with afib. Pt was transitioned from IV heparin drip  to apixaban on 05/20/19 for her  anticoagulation. Age<80, wt>60, scr<1.5 on 05/18/19.   Scr 0.66,  wnl (last checked 05/18/19),  UOP 1450 ml (0.5 ml/kg/hr) on 5/10;  UOP 1500 ml (3.5 ml/kg/hr) so far today 5/11. CBC:  H/H 12.6/37.7, pltc 294k  wnl stable (05/24/19) No bleeding reported.  Patient to discharge today to admit to CIR.   Goal of Therapy:  Monitor platelets by anticoagulation protocol: Yes   Plan:  Continue Apixaban 71m PO BID Education completed (patient and spouse) 5/9  Benefits check completed by Case manager/SW (see note 05/24/19) >Eliquis is covered, Co-Pay $33.00   RNicole Cella RPh Clinical Pharmacist Please check AMION for all [place] numbers After 10:00 PM, call [phone]/07/2019 9:01 AM

## 2019-05-21 DIAGNOSIS — I6521 Occlusion and stenosis of right carotid artery: Secondary | ICD-10-CM | POA: Diagnosis not present

## 2019-05-21 LAB — CBC
HCT: 37.1 % (ref 36.0–46.0)
Hemoglobin: 12.5 g/dL (ref 12.0–15.0)
MCH: 33.1 pg (ref 26.0–34.0)
MCHC: 33.7 g/dL (ref 30.0–36.0)
MCV: 98.1 fL (ref 80.0–100.0)
Platelets: 258 10*3/uL (ref 150–400)
RBC: 3.78 MIL/uL — ABNORMAL LOW (ref 3.87–5.11)
RDW: 12.3 % (ref 11.5–15.5)
WBC: 10.4 10*3/uL (ref 4.0–10.5)
nRBC: 0 % (ref 0.0–0.2)

## 2019-05-21 MED ORDER — CAPTOPRIL 25 MG PO TABS
50.0000 mg | ORAL_TABLET | Freq: Two times a day (BID) | ORAL | Status: DC
  Administered 2019-05-21 – 2019-05-24 (×7): 50 mg via ORAL
  Filled 2019-05-21 (×6): qty 2
  Filled 2019-05-21: qty 1
  Filled 2019-05-21: qty 2

## 2019-05-21 MED ORDER — TIOTROPIUM BROMIDE-OLODATEROL 2.5-2.5 MCG/ACT IN AERS
2.0000 | INHALATION_SPRAY | Freq: Every day | RESPIRATORY_TRACT | Status: DC
  Administered 2019-05-22 – 2019-05-24 (×3): 2 via RESPIRATORY_TRACT
  Filled 2019-05-21: qty 1

## 2019-05-21 NOTE — Progress Notes (Signed)
[facility] PROGRESS NOTE    Progress Note  Beth [name]  [id number] DOB: May 25, 1940 DOA: 05/16/2019 PCP: Kathyrn Lass, MD     Brief Narrative:   Beth [name] is an 79 y.o. female past medical history significant for hypertension, depression aneurysmal intracranial hemorrhage in 2008, seizure disorder secondary to [place] chronic respiratory failure on 2 L of oxygen secondary to COPD comes in for left-sided weakness found to have a stroke  Assessment/Plan:   CVA (cerebral vascular accident) ([place]) We will transition IV heparin to [place]. Continue Plavix, she needs to on Plavix 72 hours prior to procedure. His spoken to vascular surgery she needs to be off the DOAC for at least 48 hours prior to the procedure. Stenting with CVA scheduled for 06/07/2019.  New onset atrial fibrillation: On oral diltiazem continue IV metoprolol as needed. Continue Eliquis 48 hours prior to procedure, vascular surgery to dictate when can be restarted.  Seizure disorder: Continue Keppra. No seizure events.  Essential hypertension: Stable holding home meds atenolol captopril triamterene/hydrochlorothiazide for now allow permissive hypertension. Blood pressure is ranging 135/72 129/70  Hyperlipidemia: Continue statin.  Depression/anxiety: Continue Lexapro and Klonopin  Chronic respiratory failure on 2 L of oxygen due to COPD: Stable.  Low normal B12: Continue oral replacement.    DVT prophylaxis: lovenox Family Communication:husband Status is: Inpatient  Remains inpatient appropriate because:Hemodynamically unstable   Dispo: The patient is from: Home              Anticipated d/c is to: CIR              Anticipated d/c date is: 1 days              Patient currently is  medically stable to d/c, she is awaiting CIR placement  Code Status:     Code Status Orders  (From admission, onward)         Start     Ordered   05/16/19 1301  Full code  Continuous     05/16/19 1301        Code Status History    This patient has a current code status but no historical code status.   Advance Care Planning Activity    Advance Directive Documentation     Most Recent Value  Type of Advance Directive  Healthcare Power of Attorney  Pre-existing out of facility DNR order (yellow form or pink MOST form)  --  "MOST" Form in Place?  --        IV Access:    Peripheral IV   Procedures and diagnostic studies:   No results found.   Medical Consultants:    None.  Anti-Infectives:   none  Subjective:    Beth [name] no new complaints feels great.  Objective:    Vitals:   05/20/19 2102 05/20/19 2350 05/21/19 0322 05/21/19 0749  BP:  (!) 169/88 (!) 164/82   Pulse: 77 79 73   Resp: 16 16 17    Temp:  97.9 F (36.6 C) 98 F (36.7 C)   TempSrc:  Oral    SpO2: 94% 97% 97% 95%  Weight:      Height:       SpO2: 95 % O2 Flow Rate (L/min): 2 L/min   Intake/Output Summary (Last 24 hours) at 05/21/2019 0857 Last data filed at 05/20/2019 0932 Gross per 24 hour  Intake 228.89 ml  Output --  Net 228.89 ml   Filed Weights   05/16/19 1142  Weight: 108.6 kg    Exam: General exam: In no acute distress. Respiratory system: Good air movement and clear to auscultation. Cardiovascular system: S1 & S2 heard, RRR. No JVD. Gastrointestinal system: Abdomen is nondistended, soft and nontender.  Central nervous system: Alert and oriented.  Extremities: No pedal edema. Skin: No rashes, lesions or ulcers  Data Reviewed:    Labs: Basic Metabolic Panel: Recent Labs  Lab 05/16/19 1030 05/16/19 1030 05/16/19 1134 05/16/19 1134 05/17/19 0346 05/18/19 0503  NA 137  --  138  --  133* 138  K 3.9   < > 3.8   < > 3.7 3.7  CL 102  --  100  --  100 103  CO2 24  --   --   --  26 26  GLUCOSE 155*  --  147*  --  116* 140*  BUN 9  --  11  --  8 7*  CREATININE 0.79  --  0.70  --  0.74 0.66  CALCIUM 8.9  --   --   --  8.8* 9.0   < > = values in this interval  not displayed.   GFR Estimated Creatinine Clearance: 70.9 mL/min (by C-G formula based on SCr of 0.66 mg/dL). Liver Function Tests: Recent Labs  Lab 05/16/19 1030  AST 22  ALT 18  ALKPHOS 39  BILITOT 0.4  PROT 7.0  ALBUMIN 3.6   No results for input(s): LIPASE, AMYLASE in the last 168 hours. No results for input(s): AMMONIA in the last 168 hours. Coagulation profile Recent Labs  Lab 05/16/19 1030  INR 0.9   COVID-19 Labs  No results for input(s): DDIMER, FERRITIN, LDH, CRP in the last 72 hours.  Lab Results  Component Value Date   [place] NEGATIVE 05/16/2019    CBC: Recent Labs  Lab 05/16/19 1030 05/16/19 1030 05/16/19 1134 05/16/19 1839 05/17/19 0346 05/18/19 0503 05/20/19 0421 05/21/19 0408  WBC 10.2  --   --   --  12.5* 10.5 12.4* 10.4  NEUTROABS 6.6  --   --   --  9.0*  --   --   --   HGB 14.2   < > 14.6  --  12.9 14.0 12.8 12.5  HCT 43.4   < > 43.0 40.3 37.7 41.7 38.3 37.1  MCV 100.9*  --   --   --  96.9 97.2 97.2 98.1  PLT 274  --   --   --  247 248 251 258   < > = values in this interval not displayed.   Cardiac Enzymes: No results for input(s): CKTOTAL, CKMB, CKMBINDEX, TROPONINI in the last 168 hours. BNP (last 3 results) No results for input(s): PROBNP in the last 8760 hours. CBG: Recent Labs  Lab 05/16/19 1128  GLUCAP 158*   D-Dimer: No results for input(s): DDIMER in the last 72 hours. Hgb A1c: No results for input(s): HGBA1C in the last 72 hours. Lipid Profile: No results for input(s): CHOL, HDL, LDLCALC, TRIG, CHOLHDL, LDLDIRECT in the last 72 hours. Thyroid function studies: No results for input(s): TSH, T4TOTAL, T3FREE, THYROIDAB in the last 72 hours.  Invalid input(s): FREET3 Anemia work up: No results for input(s): VITAMINB12, FOLATE, FERRITIN, TIBC, IRON, RETICCTPCT in the last 72 hours. Sepsis Labs: Recent Labs  Lab 05/17/19 0346 05/18/19 0503 05/20/19 0421 05/21/19 0408  WBC 12.5* 10.5 12.4* 10.4    Microbiology Recent Results (from the past 240 hour(s))  Respiratory Panel by RT PCR (Flu A&B, Covid) -  Nasopharyngeal Swab     Status: None   Collection Time: 05/16/19  2:25 PM   Specimen: Nasopharyngeal Swab  Result Value Ref Range Status   SARS Coronavirus 2 by RT PCR NEGATIVE NEGATIVE Final    Comment: (NOTE) SARS-CoV-2 target nucleic acids are NOT DETECTED. The SARS-CoV-2 RNA is generally detectable in upper respiratoy specimens during the acute phase of infection. The lowest concentration of SARS-CoV-2 viral copies this assay can detect is 131 copies/mL. A negative result does not preclude SARS-Cov-2 infection and should not be used as the sole basis for treatment or other patient management decisions. A negative result may occur with  improper specimen collection/handling, submission of specimen other than nasopharyngeal swab, presence of viral mutation(s) within the areas targeted by this assay, and inadequate number of viral copies (<131 copies/mL). A negative result must be combined with clinical observations, patient history, and epidemiological information. The expected result is Negative. Fact Sheet for Patients:  PinkCheek.be Fact Sheet for Healthcare Providers:  GravelBags.it This test is not yet ap proved or cleared by the [place] FDA and  has been authorized for detection and/or diagnosis of SARS-CoV-2 by FDA under an Emergency Use Authorization (EUA). This EUA will remain  in effect (meaning this test can be used) for the duration of the COVID-19 declaration under Section 564(b)(1) of the Act, 21 U.S.C. section 360bbb-3(b)(1), unless the authorization is terminated or revoked sooner.    Influenza A by PCR NEGATIVE NEGATIVE Final   Influenza B by PCR NEGATIVE NEGATIVE Final    Comment: (NOTE) The Xpert Xpress SARS-CoV-2/FLU/RSV assay is intended as an aid in  the diagnosis of influenza from  Nasopharyngeal swab specimens and  should not be used as a sole basis for treatment. Nasal washings and  aspirates are unacceptable for Xpert Xpress SARS-CoV-2/FLU/RSV  testing. Fact Sheet for Patients: PinkCheek.be Fact Sheet for Healthcare Providers: GravelBags.it This test is not yet approved or cleared by the [place] FDA and  has been authorized for detection and/or diagnosis of SARS-CoV-2 by  FDA under an Emergency Use Authorization (EUA). This EUA will remain  in effect (meaning this test can be used) for the duration of the  Covid-19 declaration under Section 564(b)(1) of the Act, 21  U.S.C. section 360bbb-3(b)(1), unless the authorization is  terminated or revoked. Performed at [facility], [place] [address] [place], [place] [address]      Medications:   . apixaban  5 mg Oral BID  . arformoterol  15 mcg Nebulization BID  . atorvastatin  40 mg Oral Daily  . cholecalciferol  2,000 Units Oral Daily  . clonazePAM  2 mg Oral QHS  . clopidogrel  75 mg Oral Daily  . diltiazem  300 mg Oral Daily  . escitalopram  20 mg Oral Daily  . levETIRAcetam  750 mg Oral BID  . pantoprazole  40 mg Oral BID  . polyethylene glycol  17 g Oral Daily  . umeclidinium bromide  1 puff Inhalation Daily  . vitamin B-12  500 mcg Oral Daily   Continuous Infusions:     LOS: 5 days   Charlynne Cousins  [facility]  05/21/2019, 8:57 AM

## 2019-05-21 NOTE — Progress Notes (Signed)
Physical Therapy Treatment Patient Details Name: Beth [name] MRN: [id number] DOB: [birth year] Today's Date: 05/21/2019    History of Present Illness Pt is a 79 yo female admitted with sudden onset L side weakness, difficulty walking. 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  Pt with COPD and on home O2.    PT Comments    Patient continues to make progress toward PT goals and able to increased gait distance this session. Pt with c/o painful L UE and R shoulder today. Continue to recommend CIR for further skilled PT services to maximize independence and safety with mobility.     Follow Up Recommendations  CIR;Supervision/Assistance - 24 hour     Equipment Recommendations  (TBD at next venue)    Recommendations for Other Services Rehab consult     Precautions / Restrictions Precautions Precautions: Fall Precaution Comments: L inattention; maintain HR <100  Restrictions Weight Bearing Restrictions: No    Mobility  Bed Mobility Overal bed mobility: Needs Assistance Bed Mobility: Supine to Sit     Supine to sit: HOB elevated;Mod assist     General bed mobility comments: assist to elevate trunk and scoot L hip to EOB; limited today by pain with use of bilat UE   Transfers Overall transfer level: Needs assistance Equipment used: Rolling walker (2 wheeled);2 person hand held assist Transfers: Sit to/from Stand Sit to Stand: Mod assist;Min assist         General transfer comment: L hand on RW; assist to power up into standing and for balance upon standing; use of L wrist splint   Ambulation/Gait Ambulation/Gait assistance: Mod assist;+2 safety/equipment;Min assist Gait Distance (Feet): 10 Feet Assistive device: Rolling walker (2 wheeled)(L wrist splint) Gait Pattern/deviations: Step-through pattern;Decreased dorsiflexion - left;Decreased step length - right;Decreased step length - left;Decreased stance time - left;Step-to  pattern     General Gait Details: cues for sequencing, weight shift, and use of AD; assistance for balance and managing RW; chair follow    Stairs             Wheelchair Mobility    Modified Rankin (Stroke Patients Only) Modified Rankin (Stroke Patients Only) Pre-Morbid Rankin Score: No symptoms Modified Rankin: Moderately severe disability     Balance Overall balance assessment: Needs assistance Sitting-balance support: Feet supported Sitting balance-Leahy Scale: Fair     Standing balance support: Bilateral upper extremity supported;During functional activity Standing balance-Leahy Scale: Poor                              Cognition Arousal/Alertness: Awake/alert Behavior During Therapy: WFL for tasks assessed/performed Overall Cognitive Status: Within Functional Limits for tasks assessed                                 General Comments: L inattention      Exercises      General Comments        Pertinent Vitals/Pain Pain Assessment: Faces Faces Pain Scale: Hurts even more Pain Location: L UE with weight bearing and R shoulder (upper trap) Pain Descriptors / Indicators: Aching;Grimacing;Guarding;Sharp;Spasm Pain Intervention(s): Limited activity within patient's tolerance;Monitored during session;Repositioned    Home Living                      Prior Function  PT Goals (current goals can now be found in the care plan section) Progress towards PT goals: Progressing toward goals    Frequency    Min 4X/week      PT Plan Current plan remains appropriate    Co-evaluation              AM-PAC PT "6 Clicks" Mobility   Outcome Measure  Help needed turning from your back to your side while in a flat bed without using bedrails?: A Lot Help needed moving from lying on your back to sitting on the side of a flat bed without using bedrails?: A Lot Help needed moving to and from a bed to a chair  (including a wheelchair)?: A Lot Help needed standing up from a chair using your arms (e.g., wheelchair or bedside chair)?: A Little Help needed to walk in hospital room?: A Lot Help needed climbing 3-5 steps with a railing? : Total 6 Click Score: 12    End of Session Equipment Utilized During Treatment: Oxygen;Gait belt Activity Tolerance: Patient tolerated treatment well Patient left: with call bell/phone within reach;in chair;with chair alarm set Nurse Communication: Mobility status PT Visit Diagnosis: Hemiplegia and hemiparesis Hemiplegia - Right/Left: Left Hemiplegia - dominant/non-dominant: Non-dominant Hemiplegia - caused by: Cerebral infarction     Time: 1300-1331 PT Time Calculation (min) (ACUTE ONLY): 31 min  Charges:  $Gait Training: 23-37 mins                     Earney Navy, PTA Acute Rehabilitation Services Pager: [phone] Office: [phone]     Darliss Cheney 05/21/2019, 2:11 PM

## 2019-05-21 NOTE — Progress Notes (Signed)
   VASCULAR SURGERY ASSESSMENT & PLAN:   SYMPTOMATIC RIGHT CAROTID STENOSIS: No new neurologic symptoms.  We have scheduled her for transcarotid stenting on 06/07/2019.  She is on Eliquis for atrial fibrillation.  We will stop this 48 hours preop.  She is on Plavix which she will need to continue for at least 1 month postop.  She will also need to be on 81 mg of aspirin at least 3 days prior to the procedure.  If she is discharged or goes to rehab then we can arrange for her surgery appropriately.  PULMONARY: The patient is very concerned about her cough and pulmonary status.  She is followed by Dr. Elsworth Soho.  It might be worth having her seen by pulmonary preoperatively.   SUBJECTIVE:   No new symptoms overnight.  Multiple questions this morning which I answered.  PHYSICAL EXAM:   Vitals:   05/20/19 1959 05/20/19 2102 05/20/19 2350 05/21/19 0322  BP: (!) 165/94  (!) 169/88 (!) 164/82  Pulse: 77 77 79 73  Resp: 18 16 16 17   Temp: 98.7 F (37.1 C)  97.9 F (36.6 C) 98 F (36.7 C)  TempSrc: Oral  Oral   SpO2: 94% 94% 97% 97%  Weight:      Height:       Still with significant left upper extremity weakness.  LABS:   Lab Results  Component Value Date   WBC 10.4 05/21/2019   HGB 12.5 05/21/2019   HCT 37.1 05/21/2019   MCV 98.1 05/21/2019   PLT 258 05/21/2019    PROBLEM LIST:    Principal Problem:   CVA (cerebral vascular accident) ([place]) Active Problems:   Stroke, hemorrhagic ([place])   HTN (hypertension)   COPD without exacerbation ([facility])   Seizures ([facility])   HLD (hyperlipidemia)   Macrocytosis   Depression   Atrial fibrillation with RVR ([facility])   Stenosis of right carotid artery   CURRENT MEDS:   . apixaban  5 mg Oral BID  . arformoterol  15 mcg Nebulization BID  . atorvastatin  40 mg Oral Daily  . cholecalciferol  2,000 Units Oral Daily  . clonazePAM  2 mg Oral QHS  . clopidogrel  75 mg Oral Daily  . diltiazem  300 mg Oral Daily  . escitalopram  20 mg Oral Daily  .  levETIRAcetam  750 mg Oral BID  . pantoprazole  40 mg Oral BID  . polyethylene glycol  17 g Oral Daily  . umeclidinium bromide  1 puff Inhalation Daily  . vitamin B-12  500 mcg Oral Daily    [name] Office: [phone] 05/21/2019

## 2019-05-22 ENCOUNTER — Inpatient Hospital Stay (HOSPITAL_COMMUNITY): Payer: Medicare Other

## 2019-05-22 LAB — CBC
HCT: 36.9 % (ref 36.0–46.0)
Hemoglobin: 12.3 g/dL (ref 12.0–15.0)
MCH: 33.2 pg (ref 26.0–34.0)
MCHC: 33.3 g/dL (ref 30.0–36.0)
MCV: 99.5 fL (ref 80.0–100.0)
Platelets: 248 10*3/uL (ref 150–400)
RBC: 3.71 MIL/uL — ABNORMAL LOW (ref 3.87–5.11)
RDW: 12.4 % (ref 11.5–15.5)
WBC: 9.5 10*3/uL (ref 4.0–10.5)
nRBC: 0 % (ref 0.0–0.2)

## 2019-05-22 LAB — GLUCOSE, CAPILLARY: Glucose-Capillary: 140 mg/dL — ABNORMAL HIGH (ref 70–99)

## 2019-05-22 MED ORDER — TRIAMTERENE-HCTZ 37.5-25 MG PO TABS
1.0000 | ORAL_TABLET | Freq: Every day | ORAL | Status: DC
  Administered 2019-05-22 – 2019-05-24 (×3): 1 via ORAL
  Filled 2019-05-22 (×3): qty 1

## 2019-05-22 NOTE — Progress Notes (Signed)
[facility] PROGRESS NOTE    Progress Note  Beth [name]  [id number] DOB: [birth year] DOA: 05/16/2019 PCP: Kathyrn Lass, MD     Brief Narrative:   Beth [name] is an 79 y.o. female past medical history significant for hypertension, depression aneurysmal intracranial hemorrhage in 2008, seizure disorder secondary to [place] chronic respiratory failure on 2 L of oxygen secondary to COPD comes in for left-sided weakness found to have a stroke  Assessment/Plan:   CVA (cerebral vascular accident) ([place]) We will transition IV heparin to [place]. Continue Plavix, she needs to on Plavix 72 hours prior to procedure. His spoken to vascular surgery she needs to be off the DOAC for at least 48 hours prior to the procedure. Stenting with CVA scheduled for 06/07/2019. Awaiting inpatient rehab patient is medically stable for transfer.  New onset atrial fibrillation: On oral diltiazem continue IV metoprolol as needed. Continue Eliquis 48 hours prior to procedure, vascular surgery to dictate when can be restarted.  Seizure disorder: Continue Keppra. No seizure events.  Essential hypertension: Stable holding home meds atenolol captopril triamterene/hydrochlorothiazide for now allow permissive hypertension. Blood pressure is ranging 135/72 129/70  Hyperlipidemia: Continue statin.  Depression/anxiety: Continue Lexapro and Klonopin  Chronic respiratory failure on 2 L of oxygen due to COPD: Stable.  Low normal B12: Continue oral replacement.    DVT prophylaxis: lovenox Family Communication:husband Status is: Inpatient  Remains inpatient appropriate because:Hemodynamically unstable   Dispo: The patient is from: Home              Anticipated d/c is to: CIR              Anticipated d/c date is: 1 days              Patient currently adequately stable for transfer hopefully can go to inpatient rehab on 05/23/2019.  Code Status:     Code Status Orders  (From admission, onward)           Start     Ordered   05/16/19 1301  Full code  Continuous     05/16/19 1301        Code Status History    This patient has a current code status but no historical code status.   Advance Care Planning Activity    Advance Directive Documentation     Most Recent Value  Type of Advance Directive  Healthcare Power of Attorney  Pre-existing out of facility DNR order (yellow form or pink MOST form)  --  "MOST" Form in Place?  --        IV Access:    Peripheral IV   Procedures and diagnostic studies:   No results found.   Medical Consultants:    None.  Anti-Infectives:   none  Subjective:    Beth [name] no new complaints feels great.  Objective:    Vitals:   05/21/19 2014 05/21/19 2330 05/22/19 0427 05/22/19 0855  BP: (!) 193/79 (!) 158/69 (!) 182/71 (!) 174/74  Pulse: 83  69   Resp: 19 19 14 18   Temp: (!) 97.4 F (36.3 C) 97.6 F (36.4 C) 97.8 F (36.6 C) 97.8 F (36.6 C)  TempSrc: Oral Oral Oral Oral  SpO2: 96% 96% 96% 97%  Weight:      Height:       SpO2: 97 % O2 Flow Rate (L/min): 2 L/min   Intake/Output Summary (Last 24 hours) at 05/22/2019 [id number] Last data filed at 05/21/2019 1941 Gross per 24  hour  Intake 960 ml  Output 1000 ml  Net -40 ml   Filed Weights   05/16/19 1142  Weight: 108.6 kg    Exam: General exam: In no acute distress. Respiratory system: Good air movement and clear to auscultation. Cardiovascular system: S1 & S2 heard, RRR. No JVD. Gastrointestinal system: Abdomen is nondistended, soft and nontender.  Central nervous system: Alert and oriented.  Extremities: No pedal edema. Skin: No rashes, lesions or ulcers  Data Reviewed:    Labs: Basic Metabolic Panel: Recent Labs  Lab 05/16/19 1030 05/16/19 1030 05/16/19 1134 05/16/19 1134 05/17/19 0346 05/18/19 0503  NA 137  --  138  --  133* 138  K 3.9   < > 3.8   < > 3.7 3.7  CL 102  --  100  --  100 103  CO2 24  --   --   --  26 26  GLUCOSE 155*  --   147*  --  116* 140*  BUN 9  --  11  --  8 7*  CREATININE 0.79  --  0.70  --  0.74 0.66  CALCIUM 8.9  --   --   --  8.8* 9.0   < > = values in this interval not displayed.   GFR Estimated Creatinine Clearance: 70.9 mL/min (by C-G formula based on SCr of 0.66 mg/dL). Liver Function Tests: Recent Labs  Lab 05/16/19 1030  AST 22  ALT 18  ALKPHOS 39  BILITOT 0.4  PROT 7.0  ALBUMIN 3.6   No results for input(s): LIPASE, AMYLASE in the last 168 hours. No results for input(s): AMMONIA in the last 168 hours. Coagulation profile Recent Labs  Lab 05/16/19 1030  INR 0.9   COVID-19 Labs  No results for input(s): DDIMER, FERRITIN, LDH, CRP in the last 72 hours.  Lab Results  Component Value Date   [place] NEGATIVE 05/16/2019    CBC: Recent Labs  Lab 05/16/19 1030 05/16/19 1134 05/17/19 0346 05/18/19 0503 05/20/19 0421 05/21/19 0408 05/22/19 0451  WBC 10.2   < > 12.5* 10.5 12.4* 10.4 9.5  NEUTROABS 6.6  --  9.0*  --   --   --   --   HGB 14.2   < > 12.9 14.0 12.8 12.5 12.3  HCT 43.4   < > 37.7 41.7 38.3 37.1 36.9  MCV 100.9*   < > 96.9 97.2 97.2 98.1 99.5  PLT 274   < > 247 248 251 258 248   < > = values in this interval not displayed.   Cardiac Enzymes: No results for input(s): CKTOTAL, CKMB, CKMBINDEX, TROPONINI in the last 168 hours. BNP (last 3 results) No results for input(s): PROBNP in the last 8760 hours. CBG: Recent Labs  Lab 05/16/19 1128 05/22/19 0901  GLUCAP 158* 140*   D-Dimer: No results for input(s): DDIMER in the last 72 hours. Hgb A1c: No results for input(s): HGBA1C in the last 72 hours. Lipid Profile: No results for input(s): CHOL, HDL, LDLCALC, TRIG, CHOLHDL, LDLDIRECT in the last 72 hours. Thyroid function studies: No results for input(s): TSH, T4TOTAL, T3FREE, THYROIDAB in the last 72 hours.  Invalid input(s): FREET3 Anemia work up: No results for input(s): VITAMINB12, FOLATE, FERRITIN, TIBC, IRON, RETICCTPCT in the last 72  hours. Sepsis Labs: Recent Labs  Lab 05/18/19 0503 05/20/19 0421 05/21/19 0408 05/22/19 0451  WBC 10.5 12.4* 10.4 9.5   Microbiology Recent Results (from the past 240 hour(s))  Respiratory Panel by RT PCR (  Flu A&B, Covid) - Nasopharyngeal Swab     Status: None   Collection Time: 05/16/19  2:25 PM   Specimen: Nasopharyngeal Swab  Result Value Ref Range Status   SARS Coronavirus 2 by RT PCR NEGATIVE NEGATIVE Final    Comment: (NOTE) SARS-CoV-2 target nucleic acids are NOT DETECTED. The SARS-CoV-2 RNA is generally detectable in upper respiratoy specimens during the acute phase of infection. The lowest concentration of SARS-CoV-2 viral copies this assay can detect is 131 copies/mL. A negative result does not preclude SARS-Cov-2 infection and should not be used as the sole basis for treatment or other patient management decisions. A negative result may occur with  improper specimen collection/handling, submission of specimen other than nasopharyngeal swab, presence of viral mutation(s) within the areas targeted by this assay, and inadequate number of viral copies (<131 copies/mL). A negative result must be combined with clinical observations, patient history, and epidemiological information. The expected result is Negative. Fact Sheet for Patients:  PinkCheek.be Fact Sheet for Healthcare Providers:  GravelBags.it This test is not yet ap proved or cleared by the [place] FDA and  has been authorized for detection and/or diagnosis of SARS-CoV-2 by FDA under an Emergency Use Authorization (EUA). This EUA will remain  in effect (meaning this test can be used) for the duration of the COVID-19 declaration under Section 564(b)(1) of the Act, 21 U.S.C. section 360bbb-3(b)(1), unless the authorization is terminated or revoked sooner.    Influenza A by PCR NEGATIVE NEGATIVE Final   Influenza B by PCR NEGATIVE NEGATIVE Final     Comment: (NOTE) The Xpert Xpress SARS-CoV-2/FLU/RSV assay is intended as an aid in  the diagnosis of influenza from Nasopharyngeal swab specimens and  should not be used as a sole basis for treatment. Nasal washings and  aspirates are unacceptable for Xpert Xpress SARS-CoV-2/FLU/RSV  testing. Fact Sheet for Patients: PinkCheek.be Fact Sheet for Healthcare Providers: GravelBags.it This test is not yet approved or cleared by the [place] FDA and  has been authorized for detection and/or diagnosis of SARS-CoV-2 by  FDA under an Emergency Use Authorization (EUA). This EUA will remain  in effect (meaning this test can be used) for the duration of the  Covid-19 declaration under Section 564(b)(1) of the Act, 21  U.S.C. section 360bbb-3(b)(1), unless the authorization is  terminated or revoked. Performed at [facility], [place] [address] [place], [place] [address]      Medications:   . apixaban  5 mg Oral BID  . atorvastatin  40 mg Oral Daily  . captopril  50 mg Oral BID  . cholecalciferol  2,000 Units Oral Daily  . clonazePAM  2 mg Oral QHS  . clopidogrel  75 mg Oral Daily  . diltiazem  300 mg Oral Daily  . escitalopram  20 mg Oral Daily  . levETIRAcetam  750 mg Oral BID  . pantoprazole  40 mg Oral BID  . polyethylene glycol  17 g Oral Daily  . Tiotropium Bromide-Olodaterol  2 puff Inhalation Daily  . vitamin B-12  500 mcg Oral Daily   Continuous Infusions:     LOS: 6 days   Charlynne Cousins  [facility]  05/22/2019, 9:28 AM

## 2019-05-22 NOTE — Progress Notes (Signed)
Physical Therapy Treatment Patient Details Name: Beth [name] MRN: [id number] DOB: 1940/10/18 Today's Date: 05/22/2019    History of Present Illness Pt is a 79 yo female admitted with sudden onset L side weakness, difficulty walking. 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  Pt with COPD and on home O2.    PT Comments    Pt tolerated treatment well. PT and pt electing to defer functional mobility 2/2 recent x-ray of L foot and ankle, pt with pain to palpation, swelling, and ecchymosis on dorsal aspect of L foot. Pt participates in dynamic balance task emphasizing use of LUE, moving objects of various sizes from side to side on table. PT provides neuroscience education on the need for increased practice to improve motor learning. PT suggests pt perform AROM with LUE, followed by self AAROM utilizing RUE for support with most tasks if able. Pt will continue to benefit from PT POC to improve strength, balance, and mobility quality. PT continues to recommend CIR at this time.  Follow Up Recommendations  CIR;Supervision/Assistance - 24 hour     Equipment Recommendations  (defer to post-acute)    Recommendations for Other Services       Precautions / Restrictions Precautions Precautions: Fall Restrictions Weight Bearing Restrictions: No    Mobility  Bed Mobility                  Transfers                    Ambulation/Gait                 Stairs             Wheelchair Mobility    Modified Rankin (Stroke Patients Only) Modified Rankin (Stroke Patients Only) Pre-Morbid Rankin Score: No symptoms Modified Rankin: Moderately severe disability     Balance Overall balance assessment: Needs assistance Sitting-balance support: No upper extremity supported;Feet supported Sitting balance-Leahy Scale: Good Sitting balance - Comments: supervision in recliner without back support                                    Cognition Arousal/Alertness: Awake/alert Behavior During Therapy: WFL for tasks assessed/performed Overall Cognitive Status: Within Functional Limits for tasks assessed                                        Exercises Other Exercises Other Exercises: Pt performs reaching exercise picking up objects with LUE and moving to other side of the table, promoting LUE strength, ROM, motor control, as well as dynamic sitting balance and core strength.    General Comments General comments (skin integrity, edema, etc.): pt qith multiple questions about LUE function and exercise as well as proprer footwear for CIR      Pertinent Vitals/Pain Pain Assessment: Faces Faces Pain Scale: Hurts little more Pain Location: R shoulder Pain Descriptors / Indicators: Grimacing Pain Intervention(s): Monitored during session    Home Living                      Prior Function            PT Goals (current goals can now be found in the care plan section) Acute Rehab PT  Goals Patient Stated Goal: to get stronger Progress towards PT goals: Progressing toward goals    Frequency    Min 4X/week      PT Plan Current plan remains appropriate    Co-evaluation              AM-PAC PT "6 Clicks" Mobility   Outcome Measure  Help needed turning from your back to your side while in a flat bed without using bedrails?: A Lot Help needed moving from lying on your back to sitting on the side of a flat bed without using bedrails?: A Lot Help needed moving to and from a bed to a chair (including a wheelchair)?: A Lot Help needed standing up from a chair using your arms (e.g., wheelchair or bedside chair)?: A Little Help needed to walk in hospital room?: A Lot Help needed climbing 3-5 steps with a railing? : Total 6 Click Score: 12    End of Session Equipment Utilized During Treatment: Oxygen Activity Tolerance: Patient tolerated treatment well Patient left:  in chair;with call bell/phone within reach;with family/visitor present Nurse Communication: Mobility status PT Visit Diagnosis: Hemiplegia and hemiparesis Hemiplegia - Right/Left: Left Hemiplegia - dominant/non-dominant: Non-dominant Hemiplegia - caused by: Cerebral infarction     Time: [id number] PT Time Calculation (min) (ACUTE ONLY): 32 min  Charges:  $Therapeutic Activity: 8-22 mins                     Zenaida Niece, PT, DPT Acute Rehabilitation Pager: [place] 05/22/2019, 4:56 PM

## 2019-05-22 NOTE — Plan of Care (Signed)
  Problem: Education: Goal: Knowledge of General Education information will improve Description: Including pain rating scale, medication(s)/side effects and non-pharmacologic comfort measures Outcome: Progressing   Problem: Activity: Goal: Risk for activity intolerance will decrease Outcome: Progressing   Problem: Nutrition: Goal: Adequate nutrition will be maintained Outcome: Progressing   

## 2019-05-23 DIAGNOSIS — I6521 Occlusion and stenosis of right carotid artery: Secondary | ICD-10-CM | POA: Diagnosis not present

## 2019-05-23 LAB — CBC
HCT: 38.3 % (ref 36.0–46.0)
Hemoglobin: 12.9 g/dL (ref 12.0–15.0)
MCH: 33.2 pg (ref 26.0–34.0)
MCHC: 33.7 g/dL (ref 30.0–36.0)
MCV: 98.7 fL (ref 80.0–100.0)
Platelets: 265 10*3/uL (ref 150–400)
RBC: 3.88 MIL/uL (ref 3.87–5.11)
RDW: 12.6 % (ref 11.5–15.5)
WBC: 11 10*3/uL — ABNORMAL HIGH (ref 4.0–10.5)
nRBC: 0 % (ref 0.0–0.2)

## 2019-05-23 MED ORDER — ASPIRIN EC 81 MG PO TBEC
81.0000 mg | DELAYED_RELEASE_TABLET | Freq: Every day | ORAL | Status: DC
  Administered 2019-05-23 – 2019-05-24 (×2): 81 mg via ORAL
  Filled 2019-05-23 (×2): qty 1

## 2019-05-23 MED ORDER — ATORVASTATIN CALCIUM 40 MG PO TABS: 40.0000 mg | ORAL_TABLET | Freq: Every day | ORAL | Status: DC

## 2019-05-23 MED ORDER — ATORVASTATIN CALCIUM 20 MG PO TABS: 20.0000 mg | ORAL_TABLET | Freq: Every day | ORAL | 11 refills | Status: DC

## 2019-05-23 MED ORDER — APIXABAN 5 MG PO TABS: 5.0000 mg | ORAL_TABLET | Freq: Two times a day (BID) | ORAL | Status: DC

## 2019-05-23 MED ORDER — CLOPIDOGREL BISULFATE 75 MG PO TABS: 75.0000 mg | ORAL_TABLET | Freq: Every day | ORAL | Status: DC

## 2019-05-23 MED ORDER — DILTIAZEM HCL ER COATED BEADS 300 MG PO CP24: 300.0000 mg | ORAL_CAPSULE | Freq: Every day | ORAL | Status: DC

## 2019-05-23 NOTE — PMR Pre-admission (Signed)
PMR Admission Coordinator Pre-Admission Assessment  Patient: Beth [name] is an 79 y.o., female MRN: [id number] DOB: [birth year] Height: 5' 4" (162.6 cm) Weight: 108.6 kg              Insurance Information  PRIMARY: Medicare a and b      Policy#: [id number]      Subscriber: pt Benefits:  Phone #: passport one online     Name: 5/5 Eff. Date: 08/14/2006     Deduct: $1484      Out of Pocket Max: none      Life Max: none  CIR: 100%      SNF: 20 full days Outpatient: 80%     Co-Pay: 205 Home Health: 100%     Co-Pay: none DME: 80%     Co-Pay: 20% Providers: pt choice  SECONDARY: Tricare for Life      Policy#: [id number]      Phone#: pt  The "Data Collection Information Summary" for patients in Inpatient Rehabilitation Facilities with attached "Privacy Act Dewey Records" was provided and verbally reviewed with: Patient and Family  Emergency Contact Information Contact Information    Name Relation Home Work Mobile   [place] E Spouse [phone]     [place], Pals Daughter   [phone]     Current Medical History  Patient Admitting Diagnosis: right MCA infarct  History of present Illness: Beth [name] is a 79 y.o. 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 CT head negative. 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   MRI brain done revealing small acute R-MCA infarct involving frontoparietal region and chronic large right temporal lobe infarct. Dr. Erlinda Hong felt that stroke was embolic due to secondary to R-ICA stenosis and no DAPT as question that round hyper density at temporal lobe could be thrombosed aneurysm. On ASA for secondary stroke prevention.   VVS consulted for input on ICA stenosis and Dr. Scot Dock reviewed case with Dr. Donzetta Matters  and plan trans carotid stenting on 06/07/2019. She is on Eliquis. Will need to stop Eliquis  48 hrs preoperatively.     She did develop A fib with RVR  requiring IV metoprolol and IV Cardizem. Has since transitioned to po. She was continued on Hydrochlorothiazide and triamterene.  She did complain of pain in th left foot. Xrays of the ankle and foot were unremarkable.Therapy evaluations completed revealing functional deficits due to left hemiparesis with sensory deficits and left inattention.    Complete NIHSS TOTAL: 0 Glasgow Coma Scale Score: 15  Past Medical History  Past Medical History:  Diagnosis Date  . BRCA2 positive 2017  . Diverticulosis   . Hearing loss   . HTN (hypertension)   . Memory loss   . Seizure disorder, complex partial, 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[place]) seizure free for one year  . Seizures ([place])   . 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   . Vestibulitis of ear     Family History  family history includes Bleeding Disorder in her maternal grandfather; Breast cancer (age of onset: 61) in her daughter; Colon cancer in her maternal aunt; Dementia in her maternal grandfather and mother; Emphysema in her maternal aunt, maternal aunt, and maternal uncle; High blood pressure in her maternal grandmother; Osteoporosis in her mother; Other in her daughter and daughter; Ovarian cancer (  age of onset: 35) in her paternal grandmother; Ovarian cancer (age of onset: 63) in her paternal aunt; Stroke in her mother; Thyroid disease in her mother.  Prior Rehab/Hospitalizations:  Has the patient had prior rehab or hospitalizations prior to admission? Yes  Has the patient had major surgery during 100 days prior to admission? No  Current Medications   Current Facility-Administered Medications:  .  acetaminophen (TYLENOL) tablet 650 mg, 650 mg, Oral, Q4H PRN, 650 mg at 05/22/19 2016 **OR** acetaminophen (TYLENOL) 160 MG/5ML solution 650 mg, 650 mg, Per Tube, Q4H PRN **OR** acetaminophen  (TYLENOL) suppository 650 mg, 650 mg, Rectal, Q4H PRN, Pahwani, Rinka R, MD .  albuterol (PROVENTIL) (2.5 MG/3ML) 0.083% nebulizer solution 2.5 mg, 2.5 mg, Inhalation, Q4H PRN, Pahwani, Rinka R, MD, 2.5 mg at 05/21/19 0755 .  apixaban (ELIQUIS) tablet 5 mg, 5 mg, Oral, BID, Charlynne Cousins, MD, 5 mg at 05/24/19 0858 .  aspirin EC tablet 81 mg, 81 mg, Oral, Daily, Angelia Mould, MD, 81 mg at 05/24/19 0900 .  atorvastatin (LIPITOR) tablet 40 mg, 40 mg, Oral, Daily, Rosalin Hawking, MD, 40 mg at 05/24/19 0859 .  captopril (CAPOTEN) tablet 50 mg, 50 mg, Oral, BID, Charlynne Cousins, MD, 50 mg at 05/24/19 0858 .  cholecalciferol (VITAMIN D3) tablet 2,000 Units, 2,000 Units, Oral, Daily, Pahwani, Rinka R, MD, 2,000 Units at 05/24/19 0859 .  clonazePAM (KLONOPIN) tablet 2 mg, 2 mg, Oral, QHS, Pahwani, Rinka R, MD, 2 mg at 05/23/19 2210 .  clopidogrel (PLAVIX) tablet 75 mg, 75 mg, Oral, Daily, Charlynne Cousins, MD, 75 mg at 05/24/19 0858 .  diltiazem (CARDIZEM CD) 24 hr capsule 300 mg, 300 mg, Oral, Daily, Charlynne Cousins, MD, 300 mg at 05/24/19 0858 .  escitalopram (LEXAPRO) tablet 20 mg, 20 mg, Oral, Daily, Pahwani, Rinka R, MD, 20 mg at 05/24/19 0858 .  levETIRAcetam (KEPPRA) tablet 750 mg, 750 mg, Oral, BID, Pahwani, Rinka R, MD, 750 mg at 05/24/19 0859 .  metoprolol tartrate (LOPRESSOR) injection 5 mg, 5 mg, Intravenous, Q6H PRN, Charlynne Cousins, MD, 5 mg at 05/18/19 1514 .  pantoprazole (PROTONIX) EC tablet 40 mg, 40 mg, Oral, BID, Charlynne Cousins, MD, 40 mg at 05/24/19 0857 .  polyethylene glycol (MIRALAX / GLYCOLAX) packet 17 g, 17 g, Oral, Daily, Charlynne Cousins, MD, 17 g at 05/24/19 0857 .  sodium chloride (OCEAN) 0.65 % nasal spray 1 spray, 1 spray, Each Nare, PRN, Vann, Jessica U, DO, 1 spray at 05/22/19 0849 .  Tiotropium Bromide-Olodaterol 2.5-2.5 MCG/ACT AERS 2 puff, 2 puff, Inhalation, Daily, Charlynne Cousins, MD, 2 puff at 05/24/19 0757 .   triamterene-hydrochlorothiazide (MAXZIDE-25) 37.5-25 MG per tablet 1 tablet, 1 tablet, Oral, Daily, Charlynne Cousins, MD, 1 tablet at 05/24/19 0859 .  vitamin B-12 (CYANOCOBALAMIN) tablet 500 mcg, 500 mcg, Oral, Daily, Eulogio Bear U, DO, 500 mcg at 05/24/19 0857  Patients Current Diet:  Diet Order            Diet - low sodium heart healthy        Diet Heart Room service appropriate? Yes; Fluid consistency: Thin  Diet effective now              Precautions / Restrictions Precautions Precautions: Fall Precaution Comments: Left inattention  Restrictions Weight Bearing Restrictions: No   Has the patient had 2 or more falls or a fall with injury in the past year?No  Prior Activity Level Limited Community (1-2x/wk): Independent without  AD  Prior Functional Level Prior Function Level of Independence: Independent with assistive device(s) Comments: previously walked without cane or walker.  Did break ankle from past sz.  Self Care: Did the patient need help bathing, dressing, using the toilet or eating?  Independent  Indoor Mobility: Did the patient need assistance with walking from room to room (with or without device)? Independent  Stairs: Did the patient need assistance with internal or external stairs (with or without device)? Independent  Functional Cognition: Did the patient need help planning regular tasks such as shopping or remembering to take medications? Independent  Home Assistive Devices / Equipment Home Assistive Devices/Equipment: Eyeglasses Home Equipment: Shower seat  Prior Device Use: Indicate devices/aids used by the patient prior to current illness, exacerbation or injury? None of the above  Current Functional Level Cognition  Arousal/Alertness: Awake/alert Overall Cognitive Status: Within Functional Limits for tasks assessed Current Attention Level: Selective Orientation Level: Oriented to person, Oriented to place, Oriented to time, Oriented to  situation Safety/Judgement: Decreased awareness of safety, Decreased awareness of deficits General Comments: Pt pleasant and willing to participate in therapy. Continued left inattention, however improving.  Attention: Focused, Sustained Focused Attention: Appears intact Sustained Attention: Impaired Sustained Attention Impairment: Verbal complex Memory: Appears intact Executive Function: Decision Making, Reasoning Reasoning: Appears intact Decision Making: Appears intact Safety/Judgment: Appears intact [employer] Scales of Cognitive Functioning: Purposeful/appropriate    Extremity Assessment (includes Sensation/Coordination)  Upper Extremity Assessment: Defer to OT evaluation(+edema left hand; ring mobile but getting tight; elevated) LUE Deficits / Details: PROM WFL. AROM:  shoulder 100*, elbow WFL, wrist WFL, hand limited grip.   LUE Sensation: decreased light touch, decreased proprioception LUE Coordination: decreased fine motor, decreased gross motor  Lower Extremity Assessment: LLE deficits/detail LLE Deficits / Details: AROM WFL; knee extension 3+, ankle DF 5 LLE Sensation: (accurate light touch and proprioception) LLE Coordination: WNL    ADLs  Overall ADL's : Needs assistance/impaired Eating/Feeding: Sitting, Cueing for safety, Set up, Cueing for compensatory techinques Eating/Feeding Details (indicate cue type and reason): Pt with some pocketing and does not feel L side of face as much as right so reports spilling and food on that side of her mouth that she is unaware of. Grooming: Wash/dry hands, Moderate assistance, Standing Grooming Details (indicate cue type and reason): While standing at the sink. Pt rested forearms on counter. Mod assist for balance. Pt able to incorporate LUE into task.  Upper Body Bathing: Moderate assistance, Sitting, Cueing for compensatory techniques, Cueing for safety Lower Body Bathing: Maximal assistance, Cueing for compensatory  techniques, Cueing for safety, Sit to/from stand Upper Body Dressing : Maximal assistance, Sitting, Cueing for compensatory techniques, Cueing for safety Lower Body Dressing: Total assistance Lower Body Dressing Details (indicate cue type and reason): To don socks Toilet Transfer: Moderate assistance, Stand-pivot, BSC, +2 for physical assistance, +2 for safety/equipment(to strong side) Toileting- Clothing Manipulation and Hygiene: Maximal assistance, +2 for safety/equipment Toileting - Clothing Manipulation Details (indicate cue type and reason): Max assist for peri-care in standing following BM.  Functional mobility during ADLs: Moderate assistance, +2 for physical assistance, +2 for safety/equipment, Rolling walker General ADL Comments: Pt transferred to/from [facility] and ambulated to bedroom sink with RW and mod assist x 2. Pt required assist to maintain LUE on RW during mobility. Pt tolerated standing 1 x 4 min and 1 x 4.5 min during tasks.     Mobility  Overal bed mobility: Needs Assistance Bed Mobility: Supine to Sit Supine to sit: Min  guard, HOB elevated Sit to supine: Min guard Sit to sidelying: Min assist General bed mobility comments: To ensure balance and safety. Cues to utilize LUE.     Transfers  Overall transfer level: Needs assistance Equipment used: Rolling walker (2 wheeled) Transfers: Sit to/from Stand, [employer] Transfers Sit to Stand: Mod assist, +2 safety/equipment Stand pivot transfers: Mod assist, +2 safety/equipment General transfer comment: Assist to power up into standing. Cues for hand placement, sequencing, and incorporation of LUE. Assist to maintain LUE on RW.     Ambulation / Gait / Stairs / Wheelchair Mobility  Ambulation/Gait Ambulation/Gait assistance: Mod assist, +2 safety/equipment, Min assist Gait Distance (Feet): (~10 ft total with seated break ) Assistive device: Rolling walker (2 wheeled) Gait Pattern/deviations: Step-through pattern, Decreased  stance time - left, Decreased step length - right, Decreased step length - left, Trunk flexed, Wide base of support General Gait Details: assist to maintain balance, weight shift, manage RW, and maintain L hand grip on RW; cues to reposition L UE when needed, for sequencing, and upright posture     Posture / Balance Dynamic Sitting Balance Sitting balance - Comments: supervision in recliner without back support Balance Overall balance assessment: Needs assistance Sitting-balance support: No upper extremity supported, Feet supported Sitting balance-Leahy Scale: Fair Sitting balance - Comments: supervision in recliner without back support Postural control: Left lateral lean Standing balance support: Bilateral upper extremity supported, During functional activity Standing balance-Leahy Scale: Poor Standing balance comment: Pt heavily reliant on outside assist to stand.    Special needs/care consideration Trans carotid stenting planned for 06/07/2019 with Dr. Donzetta Matters Hgb A1c 6.0 Designated visitors are spouse, Jenny Reichmann and Daughter, [place] at 2  To 3 liters [place]   Previous Home Environment  Living Arrangements: Spouse/significant other  Lives With: Spouse Available Help at Discharge: Family, Available 24 hours/day Type of Home: House Home Layout: One level Home Access: Stairs to enter Entrance Stairs-Rails: Right Entrance Stairs-Number of Steps: 4 Bathroom Shower/Tub: Gaffer, Charity fundraiser: Handicapped height Bathroom Accessibility: Yes How Accessible: Accessible via walker [place]: No  Discharge Living Setting Plans for Discharge Living Setting: Patient's home, Lives with (comment)(spouse) Type of Home at Discharge: House Discharge Home Layout: One level Discharge Home Access: Stairs to enter Entrance Stairs-Rails: Right Entrance Stairs-Number of Steps: 4 Discharge Bathroom Shower/Tub: Walk-in shower Discharge Bathroom Toilet: Handicapped  height Discharge Bathroom Accessibility: Yes How Accessible: Accessible via walker Does the patient have any problems obtaining your medications?: No  Social/Family/Support Systems Patient Roles: Spouse Contact Information: spouse, [name] Anticipated Ambulance person Information: see above Ability/Limitations of Caregiver: no limitations Caregiver Availability: 24/7 Discharge Plan Discussed with Primary Caregiver: Yes Is Caregiver In Agreement with Plan?: Yes Does Caregiver/Family have Issues with Lodging/Transportation while Pt is in Rehab?: No  Goals Patient/Family Goal for Rehab: supervision PT, OT, and SLP Expected length of stay: ELOS 10 to 14 days; TCAR lanned 5/25 Pt/Family Agrees to Admission and willing to participate: Yes Program Orientation Provided & Reviewed with Pt/Caregiver Including Roles  & Responsibilities: Yes  Decrease burden of Care through IP rehab admission: n/a  Possible need for SNF placement upon discharge:not anticipated  Patient Condition: This patient's medical and functional status has changed since the consult dated 05/18/2019 in which the Rehabilitation Physician determined and documented that the patient was potentially appropriate for intensive rehabilitative care in an inpatient rehabilitation facility. Issues have been addressed and update has been discussed with Dr. Dagoberto Ligas and patient now appropriate for inpatient rehabilitation.  Will admit to inpatient rehab today.   Preadmission Screen Completed By:  Cleatrice Burke, RN, 05/24/2019 10:23 AM ______________________________________________________________________   Discussed status with Dr. Dagoberto Ligas on 05/24/2019 at  1023 and received approval for admission today.  Admission Coordinator:  Cleatrice Burke, time 0354 Date 05/24/2019

## 2019-05-23 NOTE — Progress Notes (Signed)
Orthopedic Tech Progress Note Patient Details:  Beth [name] 12-Jul-1940 [id number]  Patient ID: Beth [name], female   DOB: [birth year], 79 y.o.   MRN: [id number] Called in order to [place] 05/23/2019, 6:10 AM

## 2019-05-23 NOTE — Progress Notes (Signed)
Physical Therapy Treatment Patient Details Name: Beth [name] MRN: [id number] DOB: 30-Aug-1940 Today's Date: 05/23/2019    History of Present Illness Pt is a 79 yo female admitted with sudden onset L side weakness, difficulty walking. 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  Pt with COPD and on home O2.    PT Comments    Patient is making steady progress toward PT goals. Pt continues to be a great candidate for CIR level therapies.    Follow Up Recommendations  CIR;Supervision/Assistance - 24 hour     Equipment Recommendations  Other (comment)(defer to post acute)    Recommendations for Other Services Rehab consult     Precautions / Restrictions Precautions Precautions: Fall Precaution Comments: Left inattention  Restrictions Weight Bearing Restrictions: No    Mobility  Bed Mobility Overal bed mobility: Needs Assistance Bed Mobility: Supine to Sit     Supine to sit: Min guard;HOB elevated     General bed mobility comments: To ensure balance and safety. Cues to utilize LUE.   Transfers Overall transfer level: Needs assistance Equipment used: Rolling walker (2 wheeled) Transfers: Sit to/from [facility] Sit to Stand: Mod assist;+2 safety/equipment Stand pivot transfers: Mod assist;+2 safety/equipment       General transfer comment: Assist to power up into standing. Cues for hand placement, sequencing, and incorporation of LUE. Assist to maintain LUE on RW.   Ambulation/Gait Ambulation/Gait assistance: Mod assist;+2 safety/equipment;Min assist Gait Distance (Feet): (~10 ft total ) Assistive device: Rolling walker (2 wheeled) Gait Pattern/deviations: Step-through pattern;Decreased stance time - left;Decreased step length - right;Decreased step length - left;Trunk flexed;Wide base of support     General Gait Details: assist to maintain balance, weight shift, manage RW, and maintain L hand grip on RW;  cues to reposition L UE when needed, for sequencing, and upright posture    Stairs             Wheelchair Mobility    Modified Rankin (Stroke Patients Only)       Balance Overall balance assessment: Needs assistance Sitting-balance support: No upper extremity supported;Feet supported Sitting balance-Leahy Scale: Fair     Standing balance support: Bilateral upper extremity supported;During functional activity Standing balance-Leahy Scale: Poor                              Cognition Arousal/Alertness: Awake/alert Behavior During Therapy: WFL for tasks assessed/performed Overall Cognitive Status: Within Functional Limits for tasks assessed                                 General Comments: Pt pleasant and willing to participate in therapy. Continued left inattention, however improving.       Exercises      General Comments General comments (skin integrity, edema, etc.): Pt on 3L [place] with SpO2 maintaining in 90s.       Pertinent Vitals/Pain Pain Assessment: 0-10 Pain Score: 10-Worst pain ever Pain Location: R shoulder Pain Descriptors / Indicators: Grimacing;Guarding Pain Intervention(s): Limited activity within patient's tolerance;Monitored during session;Repositioned    Home Living                      Prior Function            PT Goals (current goals can now be found in the  care plan section) Progress towards PT goals: Progressing toward goals    Frequency    Min 4X/week      PT Plan Current plan remains appropriate    Co-evaluation PT/OT/SLP Co-Evaluation/Treatment: Yes Reason for Co-Treatment: For patient/therapist safety;To address functional/ADL transfers PT goals addressed during session: Mobility/safety with mobility;Balance OT goals addressed during session: ADL's and self-care;Strengthening/ROM      AM-PAC PT "6 Clicks" Mobility   Outcome Measure  Help needed turning from your back to your side  while in a flat bed without using bedrails?: A Lot Help needed moving from lying on your back to sitting on the side of a flat bed without using bedrails?: A Lot Help needed moving to and from a bed to a chair (including a wheelchair)?: A Lot Help needed standing up from a chair using your arms (e.g., wheelchair or bedside chair)?: A Lot Help needed to walk in hospital room?: A Lot Help needed climbing 3-5 steps with a railing? : Total 6 Click Score: 11    End of Session Equipment Utilized During Treatment: Oxygen;Gait belt Activity Tolerance: Patient tolerated treatment well Patient left: in chair;with call bell/phone within reach;with chair alarm set;with family/visitor present Nurse Communication: Mobility status PT Visit Diagnosis: Hemiplegia and hemiparesis Hemiplegia - Right/Left: Left Hemiplegia - dominant/non-dominant: Non-dominant Hemiplegia - caused by: Cerebral infarction     Time: [id number] PT Time Calculation (min) (ACUTE ONLY): 45 min  Charges:  $Gait Training: 8-22 mins                     Earney Navy, PTA Acute Rehabilitation Services Pager: [phone] Office: [phone]     Darliss Cheney 05/23/2019, 2:26 PM

## 2019-05-23 NOTE — Progress Notes (Signed)
   VASCULAR SURGERY ASSESSMENT & PLAN:   SYMPTOMATIC RIGHT CAROTID STENOSIS: She is continuing to make gradual improvement in her strength on the left.  She is scheduled for transcarotid stenting on 06/07/2019.  She is on Eliquis for atrial fibrillation.  She is on aspirin and Plavix.  She is also on a statin.  We will stop her Eliquis 48 hours preoperatively on 06/05/19.  She is scheduled for surgery with Dr. Donzetta Matters and I will have him stop by this week as I am off the remainder of the week.  SUBJECTIVE:   Complains of pain in the left foot.  X-rays of the ankle and foot were unremarkable.   PHYSICAL EXAM:   Vitals:   05/22/19 2004 05/23/19 0011 05/23/19 0012 05/23/19 0345  BP: (!) 156/77 (!) 152/73 (!) 152/73 (!) 149/76  Pulse:      Resp: 20 (!) 26 18 15   Temp: (!) 96.7 F (35.9 C) 97.6 F (36.4 C) 97.6 F (36.4 C) 97.7 F (36.5 C)  TempSrc: Oral Oral Oral Oral  SpO2: 96% 97%  96%  Weight:      Height:       Continues to show slight improvement in strength in the left upper extremity.  She tells me that she is ambulating some.  LABS:   Lab Results  Component Value Date   WBC 11.0 (H) 05/23/2019   HGB 12.9 05/23/2019   HCT 38.3 05/23/2019   MCV 98.7 05/23/2019   PLT 265 05/23/2019   CBG (last 3)  Recent Labs    05/22/19 0901  GLUCAP 140*    PROBLEM LIST:    Principal Problem:   CVA (cerebral vascular accident) ([place]) Active Problems:   Stroke, hemorrhagic ([place])   HTN (hypertension)   COPD without exacerbation ([facility])   Seizures ([facility])   HLD (hyperlipidemia)   Macrocytosis   Depression   Atrial fibrillation with RVR ([facility])   Stenosis of right carotid artery   CURRENT MEDS:   . apixaban  5 mg Oral BID  . aspirin EC  81 mg Oral Daily  . atorvastatin  40 mg Oral Daily  . captopril  50 mg Oral BID  . cholecalciferol  2,000 Units Oral Daily  . clonazePAM  2 mg Oral QHS  . clopidogrel  75 mg Oral Daily  . diltiazem  300 mg Oral Daily  . escitalopram  20 mg Oral  Daily  . levETIRAcetam  750 mg Oral BID  . pantoprazole  40 mg Oral BID  . polyethylene glycol  17 g Oral Daily  . Tiotropium Bromide-Olodaterol  2 puff Inhalation Daily  . triamterene-hydrochlorothiazide  1 tablet Oral Daily  . vitamin B-12  500 mcg Oral Daily    [name] Office: [phone] 05/23/2019

## 2019-05-23 NOTE — Discharge Summary (Addendum)
Physician Discharge Summary  Beth [name] YTR:[id number] DOB: 25-Oct-1940 DOA: 05/16/2019  PCP: Kathyrn Lass, MD  Admit date: 05/16/2019 Discharge date: 05/24/2019  Admitted From: Home Disposition:  CIR  Recommendations for Outpatient Follow-up:  1. Follow up with PCP in 1-2 weeks 2. Please obtain BMP/CBC in one week   Home Health:No Equipment/Devices:None  Discharge Condition:stable CODE STATUS:Full Diet recommendation: Heart Healthy   Brief/Interim Summary: 79 y.o. female past medical history significant for hypertension, depression aneurysmal intracranial hemorrhage in 2008, seizure disorder secondary to [place] chronic respiratory failure on 2 L of oxygen secondary to COPD comes in for left-sided weakness found to have a stroke CTA showed lesions   Discharge Diagnoses:  Principal Problem:   CVA (cerebral vascular accident) ([place]) Active Problems:   Stroke, hemorrhagic ([place])   HTN (hypertension)   COPD without exacerbation ([place])   Seizures ([place])   HLD (hyperlipidemia)   Macrocytosis   Depression   Atrial fibrillation with RVR ([place])   Stenosis of right carotid artery  Acute CVA: She has a history of an intracranial aneurysm back in 2008, MRI of the brain showed an MCA infarct with an RCA proximal stenosis echocardiogram showed an EF of 55% CT angio of the showed intracranial stenosis vascular surgery was consulted vascular surgery was consulted for possible transcarotid stenting, they agreed and will perform this in 2 weeks the patient will need to be on Plavix for the procedure and will need to stay on Plavix for at least 3 months post procedure.  Which will be done as an outpatient. Due to her A. fib she was started on NOAC see below for further details.  New onset atrial fibrillation: Controlled with diltiazem started on IV heparin on admission now on Eliquis which she will continue as an outpatient.  Seizure disorder: Likely due to stroke continue Keppra.  Essential  hypertension: Her atenolol will discontinue as she was started on IV diltiazem for A. fib, she was continued on hydrochlorothiazide and triamterene. Initially during her admission she was allowed permissive hypertension now we will try to get her blood pressure to goal over the next 2 weeks.  Continue statin.  Depression/anxiety: Continue Lexapro and Klonopin.  Chronic respiratory failure due to COPD on 2 L of oxygen at home she remained stable.  Low B12: It was repleted orally she will continue it as an outpatient.  Discharge Instructions  Discharge Instructions    Ambulatory referral to Neurology   Complete by: As directed    An appointment is requested in approximately: 4 weeks   Diet - low sodium heart healthy   Complete by: As directed    Increase activity slowly   Complete by: As directed      Allergies as of 05/24/2019      Reactions   Prozac [fluoxetine Hcl] Other (See Comments)   seizures   Sulfa Antibiotics Nausea And Vomiting, Other (See Comments)   TINGLING IN LEGS ALSO   Micardis [telmisartan] Swelling, Other (See Comments)   Swelling, bruising Swelling, bruising   Prednisone Other (See Comments)   mood changes Mood changes   Lisinopril-hydrochlorothiazide Other (See Comments)   unknown   Augmentin [amoxicillin-pot Clavulanate] Rash   Avelox [moxifloxacin Hcl In Nacl] Rash   LEVAQUIN IS OK-SEE NOTE   Norvasc [amlodipine Besylate] Other (See Comments)   fatigue      Medication List    STOP taking these medications   atenolol 50 MG tablet Commonly known as: TENORMIN   simvastatin 10 MG tablet Commonly  known as: ZOCOR     TAKE these medications   albuterol 108 (90 Base) MCG/ACT inhaler Commonly known as: VENTOLIN HFA Inhale 2 puffs into the lungs every 4 (four) hours as needed for wheezing or shortness of breath.   apixaban 5 MG Tabs tablet Commonly known as: ELIQUIS Take 1 tablet (5 mg total) by mouth 2 (two) times daily.   aspirin EC 81 MG  tablet Take 81 mg by mouth daily.   atorvastatin 20 MG tablet Commonly known as: Lipitor Take 2 tablets (40 mg total) by mouth daily.   captopril 50 MG tablet Commonly known as: CAPOTEN Take 50 mg by mouth 2 (two) times daily.   clonazePAM 2 MG tablet Commonly known as: KLONOPIN TAKE 1 TABLET AT BEDTIME What changed:   how much to take  when to take this  additional instructions   clopidogrel 75 MG tablet Commonly known as: PLAVIX Take 1 tablet (75 mg total) by mouth daily.   diltiazem 300 MG 24 hr capsule Commonly known as: CARDIZEM CD Take 1 capsule (300 mg total) by mouth daily.   Flonase 50 MCG/ACT nasal spray Generic drug: fluticasone Place 1 spray into the nose daily as needed for allergies. Spray twice daily as needed   levETIRAcetam 750 MG Tb3d Take 750 mg by mouth 2 (two) times daily.   Lexapro 20 MG tablet Generic drug: escitalopram Take 1 tablet (20 mg total) by mouth daily. Morning dosage   Tiotropium Bromide-Olodaterol 2.5-2.5 MCG/ACT Aers Inhale 2 puffs into the lungs daily.   triamterene-hydrochlorothiazide 37.5-25 MG tablet Commonly known as: MAXZIDE-25 Take 1 tablet by mouth daily.   Vitamin D3 50 MCG (2000 UT) capsule 1 tablet by mouth daily      Follow-up Information    Dohmeier, Asencion Partridge, MD. Schedule an appointment as soon as possible for a visit in 4 week(s).   Specialty: Neurology Contact information: [address] [place] [address] [phone]          Allergies  Allergen Reactions  . Prozac [Fluoxetine Hcl] Other (See Comments)    seizures  . Sulfa Antibiotics Nausea And Vomiting and Other (See Comments)    TINGLING IN LEGS ALSO  . Micardis [Telmisartan] Swelling and Other (See Comments)    Swelling, bruising Swelling, bruising  . Prednisone Other (See Comments)    mood changes Mood changes  . Lisinopril-Hydrochlorothiazide Other (See Comments)    unknown  . Augmentin [Amoxicillin-Pot Clavulanate]  Rash  . Avelox [Moxifloxacin Hcl In Nacl] Rash    LEVAQUIN IS OK-SEE NOTE  . Norvasc [Amlodipine Besylate] Other (See Comments)    fatigue    Consultations:  Neurology    Vascular surgery   Procedures/Studies: CT Code Stroke CTA Head W/WO contrast  Result Date: 05/16/2019 CLINICAL DATA:  Code stroke follow-up, left-sided weakness EXAM: CT ANGIOGRAPHY HEAD AND NECK TECHNIQUE: Multidetector CT imaging of the head and neck was performed using the standard protocol during bolus administration of intravenous contrast. Multiplanar CT image reconstructions and MIPs were obtained to evaluate the vascular anatomy. Carotid stenosis measurements (when applicable) are obtained utilizing NASCET criteria, using the distal internal carotid diameter as the denominator. CONTRAST:  3m OMNIPAQUE IOHEXOL 350 MG/ML SOLN COMPARISON:  None. FINDINGS: CTA NECK FINDINGS Aortic arch: Calcified and noncalcified plaque along the arch and patent great vessel origins. Right carotid system: Common carotid is patent with atherosclerotic wall thickening and partially retropharyngeal course. There is primarily calcified plaque at the bifurcation extending along the proximal internal carotid  causing approximately 55% stenosis. Remainder of the cervical ICA is patent. There is high-grade stenosis at the external carotid origin. Left carotid system: Common carotid is patent with atherosclerotic wall thickening. Internal and external carotid arteries are patent. There is mild calcified plaque along the proximal ICA without measurable stenosis. Vertebral arteries: Patent.  No measurable stenosis. Skeleton: Degenerative changes of the cervical spine. Other neck: No mass or adenopathy. Upper chest: Emphysema. Review of the MIP images confirms the above findings CTA HEAD FINDINGS Anterior circulation: Intracranial internal carotid arteries patent with minimal calcified plaque. Anterior and middle cerebral arteries are patent. Posterior  circulation: Intracranial vertebral arteries basilar artery, and posterior cerebral arteries are patent. Venous sinuses: As permitted by contrast timing, patent. Review of the MIP images confirms the above findings IMPRESSION: No large vessel occlusion. Plaque at the right ICA origin causes approximately 55% stenosis. There is no measurable stenosis at the left ICA origin. Electronically Signed   By: Macy Mis M.D.   On: 05/16/2019 12:26   DG Ankle 2 Views Left  Result Date: 05/22/2019 CLINICAL DATA:  Left ankle pain and swelling EXAM: LEFT ANKLE - 2 VIEW COMPARISON:  None. FINDINGS: There is soft tissue swelling about the ankle. There is no acute displaced fracture. No dislocation. There is a moderate-sized plantar calcaneal spur. There is an Achilles tendon enthesophyte. Degenerative changes are noted of the midfoot. IMPRESSION: Soft tissue swelling without acute fracture or dislocation. Electronically Signed   By: Constance Holster M.D.   On: 05/22/2019 16:56   CT Code Stroke CTA Neck W/WO contrast  Result Date: 05/16/2019 CLINICAL DATA:  Code stroke follow-up, left-sided weakness EXAM: CT ANGIOGRAPHY HEAD AND NECK TECHNIQUE: Multidetector CT imaging of the head and neck was performed using the standard protocol during bolus administration of intravenous contrast. Multiplanar CT image reconstructions and MIPs were obtained to evaluate the vascular anatomy. Carotid stenosis measurements (when applicable) are obtained utilizing NASCET criteria, using the distal internal carotid diameter as the denominator. CONTRAST:  28m OMNIPAQUE IOHEXOL 350 MG/ML SOLN COMPARISON:  None. FINDINGS: CTA NECK FINDINGS Aortic arch: Calcified and noncalcified plaque along the arch and patent great vessel origins. Right carotid system: Common carotid is patent with atherosclerotic wall thickening and partially retropharyngeal course. There is primarily calcified plaque at the bifurcation extending along the proximal  internal carotid causing approximately 55% stenosis. Remainder of the cervical ICA is patent. There is high-grade stenosis at the external carotid origin. Left carotid system: Common carotid is patent with atherosclerotic wall thickening. Internal and external carotid arteries are patent. There is mild calcified plaque along the proximal ICA without measurable stenosis. Vertebral arteries: Patent.  No measurable stenosis. Skeleton: Degenerative changes of the cervical spine. Other neck: No mass or adenopathy. Upper chest: Emphysema. Review of the MIP images confirms the above findings CTA HEAD FINDINGS Anterior circulation: Intracranial internal carotid arteries patent with minimal calcified plaque. Anterior and middle cerebral arteries are patent. Posterior circulation: Intracranial vertebral arteries basilar artery, and posterior cerebral arteries are patent. Venous sinuses: As permitted by contrast timing, patent. Review of the MIP images confirms the above findings IMPRESSION: No large vessel occlusion. Plaque at the right ICA origin causes approximately 55% stenosis. There is no measurable stenosis at the left ICA origin. Electronically Signed   By: PMacy MisM.D.   On: 05/16/2019 12:26   MR BRAIN WO CONTRAST  Result Date: 05/16/2019 CLINICAL DATA:  Suspected stroke. Left-sided weakness. EXAM: MRI HEAD WITHOUT CONTRAST TECHNIQUE: Multiplanar, multiecho pulse sequences of  the brain and surrounding structures were obtained without intravenous contrast. COMPARISON:  Head CT 05/16/2019 and MRI 04/21/2017 FINDINGS: Brain: There is a small acute right MCA territory infarct primarily involving cortex in the frontoparietal region. The posterior aspect of the right insula is also involved. A large chronic infarct is again noted laterally in the right temporal lobe with a small amount of associated chronic blood products. Patchy T2 hyperintensities in the cerebral white matter bilaterally have mildly progressed  from the prior MRI and are nonspecific but compatible with mildly age advanced chronic small vessel ischemic disease. There is mild global cerebral atrophy. No mass, midline shift, or extra-axial fluid collection is identified. Vascular: Major intracranial vascular flow voids are preserved. Skull and upper cervical spine: No suspicious marrow lesion. Moderate disc and facet degeneration in the cervical spine. Sinuses/Orbits: Bilateral cataract extraction. Unchanged left maxillary sinus mucous retention cyst. Clear mastoid air cells. Other: None. IMPRESSION: 1. Small acute right MCA infarct. 2. Chronic right temporal lobe infarct. 3. Mild chronic small vessel ischemic disease. Electronically Signed   By: Logan Bores M.D.   On: 05/16/2019 14:42   DG Foot 2 Views Left  Result Date: 05/22/2019 CLINICAL DATA:  Pain and swelling. EXAM: LEFT FOOT - 2 VIEW COMPARISON:  None. FINDINGS: There is nonspecific soft tissue swelling about the foot. There are degenerative changes of the first metatarsophalangeal joint. There is lucency at the head of the first metatarsal which may be secondary to underlying degenerative changes. There is a moderate-sized plantar calcaneal spur. IMPRESSION: 1. No acute displaced fracture or dislocation. 2. There is nonspecific soft tissue swelling about the foot. Electronically Signed   By: Constance Holster M.D.   On: 05/22/2019 16:57   ECHOCARDIOGRAM COMPLETE  Result Date: 05/17/2019    ECHOCARDIOGRAM REPORT   Patient Name:   DRUE CAMERA Date of Exam: 05/17/2019 Medical Rec #:  [id number]      Height:       64.0 in Accession #:    [id number]     Weight:       239.4 lb Date of Birth:  1940-05-25       BSA:          2.112 m Patient Age:    22 years       BP:           158/85 mmHg Patient Gender: F              HR:           86 bpm. Exam Location:  Inpatient Procedure: 2D Echo Indications:    stroke 434.91  History:        Patient has prior history of Echocardiogram examinations, most                  recent 11/04/2018. Stroke; Risk Factors:Hypertension and Former                 Smoker.  Sonographer:    Jannett Celestine RDCS (AE) Referring Phys: [id number] Mckinley Jewel  Sonographer Comments: Suboptimal parasternal window. Image acquisition challenging due to patient body habitus and Image acquisition challenging due to respiratory motion. limited mobility (left side non-mobile) IMPRESSIONS  1. Left ventricular ejection fraction, by estimation, is 55 to 60%. The left ventricle has normal function. Left ventricular endocardial border not optimally defined to evaluate regional wall motion. Left ventricular diastolic function could not be evaluated.  2. Right ventricular systolic function was not well visualized. The right  ventricular size is not well visualized.  3. The mitral valve is grossly normal. Mild mitral valve regurgitation.  4. The aortic valve was not well visualized. Aortic valve regurgitation is not visualized. No aortic stenosis is present.  5. Pulmonic valve regurgitation not well visualized. Conclusion(s)/Recommendation(s): No intracardiac source of embolism detected on this transthoracic study. A transesophageal echocardiogram is recommended to exclude cardiac source of embolism if clinically indicated. Technically challenging study. Recommend use of echo contrast for future studies. Prominent pericardial fat pad but only very small effusion. LV function globally appears normal, but cannot exclude small focal wall motion abnormalities. FINDINGS  Left Ventricle: Left ventricular ejection fraction, by estimation, is 55 to 60%. The left ventricle has normal function. Left ventricular endocardial border not optimally defined to evaluate regional wall motion. The left ventricular internal cavity size was normal in size. There is no left ventricular hypertrophy. Left ventricular diastolic function could not be evaluated. Right Ventricle: The right ventricular size is not well visualized. Right  vetricular wall thickness was not assessed. Right ventricular systolic function was not well visualized. Left Atrium: Left atrial size was not well visualized. Right Atrium: Right atrial size was not well visualized. Pericardium: A small pericardial effusion is present. Presence of pericardial fat pad. Mitral Valve: The mitral valve is grossly normal. Mild mitral annular calcification. Mild mitral valve regurgitation. Tricuspid Valve: The tricuspid valve is grossly normal. Tricuspid valve regurgitation is trivial. No evidence of tricuspid stenosis. Aortic Valve: The aortic valve was not well visualized. Aortic valve regurgitation is not visualized. No aortic stenosis is present. Pulmonic Valve: The pulmonic valve was not well visualized. Pulmonic valve regurgitation not well visualized. Aorta: The aortic root was not well visualized. Venous: The inferior vena cava was not well visualized. IAS/Shunts: The interatrial septum was not well visualized.  AORTIC VALVE LVOT Vmax:   79.20 cm/s LVOT Vmean:  50.200 cm/s LVOT VTI:    0.132 m MITRAL VALVE MV Area (PHT): 3.42 cm    SHUNTS MV Decel Time: 222 msec    Systemic VTI: 0.13 m MV E velocity: 85.70 cm/s Buford Dresser MD Electronically signed by Buford Dresser MD Signature Date/Time: 05/17/2019/7:25:16 PM    Final    US PELVIC COMPLETE WITH TRANSVAGINAL  Result Date: 04/26/2019 CLINICAL DATA:  BRCA2 positive, surveillance EXAM: TRANSABDOMINAL AND TRANSVAGINAL ULTRASOUND OF PELVIS TECHNIQUE: Both transabdominal and transvaginal ultrasound examinations of the pelvis were performed. Transabdominal technique was performed for global imaging of the pelvis including uterus, ovaries, adnexal regions, and pelvic cul-de-sac. It was necessary to proceed with endovaginal exam following the transabdominal exam to visualize the uterus, endometrium, and ovaries. COMPARISON:  10/26/2018 FINDINGS: Uterus Measurements: 4.1 x 2.3 x 2.8 cm = volume: 14 mL. Atrophic.  Anteverted. Heterogeneous myometrium. 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 No additional mass lesions. Nabothian cysts at cervix. Endometrium Thickness: 4 mm.  No definite endometrial fluid or focal abnormality Right ovary Not visualized, question atrophic versus obscured by bowel Left ovary Not visualized, question atrophic versus obscured by bowel Other findings No free pelvic fluid or adnexal masses. IMPRESSION: Nonvisualization of ovaries. Otherwise negative exam. Electronically Signed   By: Lavonia Dana M.D.   On: 04/26/2019 16:38   CT HEAD CODE STROKE WO CONTRAST  Result Date: 05/16/2019 CLINICAL DATA:  Code stroke.  Left-sided weakness EXAM: CT HEAD WITHOUT CONTRAST TECHNIQUE: Contiguous axial images were obtained from the base of the skull through the vertex without intravenous contrast. COMPARISON:  None. FINDINGS: Brain: There is no acute intracranial hemorrhage or mass effect. No definite acute appearing loss of gray-white differentiation. Questionable loss of gray-white differentiation along the right postcentral gyrus is probably artifactual. Encephalomalacia is again identified within the right temporal lobe extending posterior superiorly to the atrium of the right lateral ventricle. Additional patchy hypoattenuation in the supratentorial white matter is nonspecific but probably reflects stable chronic microvascular ischemic changes. Ventricles are stable in size. Vascular: There is no hyperdense vessel. Intracranial atherosclerotic calcification is present at the skull base. Skull: Calvarium is unremarkable. Sinuses/Orbits: Left maxillary sinus retention cyst or polyp. No acute orbital finding. Other: Mastoid air cells are clear. ASPECTS ([place] Stroke Program Early CT Score) - Ganglionic level infarction (caudate, lentiform nuclei, internal capsule, insula, M1-M3 cortex): 7 - Supraganglionic infarction (M4-M6 cortex): 3 Total score (0-10  with 10 being normal): 10 IMPRESSION: No acute intracranial hemorrhage or evidence of acute infarction. ASPECT score is 10. These results were called by telephone at the time of interpretation on 05/16/2019 to provider Dr. Cheral Marker, who verbally acknowledged these results. Electronically Signed   By: Macy Mis M.D.   On: 05/16/2019 12:09   VAS US CAROTID  Result Date: 05/18/2019 Carotid Arterial Duplex Study Indications:                            CVA. Limitations                             Today's exam was limited due to the high                                         bifurcation of the carotid, heavy                                         calcification and the resulting                                         shadowing and the patient's respiratory                                         variation. Comparison Study:                       No prior study Pre-Surgical Evaluation & Surgical      Stenosis at [facility] only. Right bifurcation Correlation                             is located near the mandible. Performing Technologist: Maudry Mayhew MHA, RDMS, RVT, RDCS  Examination Guidelines: A complete evaluation includes B-mode imaging, spectral Doppler, color Doppler, and power Doppler as needed of all accessible portions of each vessel. Bilateral testing is considered an integral part of a complete examination. Limited examinations for reoccurring indications may be performed as noted.  Right Carotid Findings: +----------+--------+-------+--------+------------------------+----------------+           PSV cm/sEDV  StenosisPlaque Description      Comments                           cm/s                                                    +----------+--------+-------+--------+------------------------+----------------+ CCA Prox  44      10                                                      +----------+--------+-------+--------+------------------------+----------------+ CCA Distal32       9                                                       +----------+--------+-------+--------+------------------------+----------------+ ICA Prox  324     61     60-79%  heterogenous and        high bifurcation                                  calcific                                 +----------+--------+-------+--------+------------------------+----------------+ ICA Distal82      22                                     tortuous         +----------+--------+-------+--------+------------------------+----------------+ ECA       199     43             calcific                                 +----------+--------+-------+--------+------------------------+----------------+ +----------+--------+-------+----------------+-------------------+           PSV cm/sEDV cmsDescribe        Arm Pressure (mmHG) +----------+--------+-------+----------------+-------------------+ Subclavian101            Multiphasic, WNL                    +----------+--------+-------+----------------+-------------------+ +---------+--------+--+--------+-+---------+ VertebralPSV cm/s24EDV cm/s6Antegrade +---------+--------+--+--------+-+---------+  Left Carotid Findings: +----------+--------+--------+--------+-------------------------+--------+           PSV cm/sEDV cm/sStenosisPlaque Description       Comments +----------+--------+--------+--------+-------------------------+--------+ CCA Prox  103     19                                                +----------+--------+--------+--------+-------------------------+--------+ CCA Distal83      21              calcific                          +----------+--------+--------+--------+-------------------------+--------+  ICA Prox  56      16              heterogenous and calcific         +----------+--------+--------+--------+-------------------------+--------+ ICA Distal51      13                                                 +----------+--------+--------+--------+-------------------------+--------+ ECA       112     20                                                +----------+--------+--------+--------+-------------------------+--------+ +----------+--------+--------+----------------+-------------------+           PSV cm/sEDV cm/sDescribe        Arm Pressure (mmHG) +----------+--------+--------+----------------+-------------------+ Subclavian                Multiphasic, WNL                    +----------+--------+--------+----------------+-------------------+ +---------+--------+--------+--------------+ VertebralPSV cm/sEDV cm/sNot identified +---------+--------+--------+--------------+   Summary: Right Carotid: Velocities in 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 Left Carotid: Velocities in the left ICA are consistent with a 1-39% stenosis. Vertebrals:  Right vertebral artery demonstrates antegrade flow. Left vertebral              artery was not visualized. Subclavians: Normal flow hemodynamics were seen in bilateral subclavian              arteries. *See table(s) above for measurements and observations.  Electronically signed by [name] MD on 05/18/2019 at 3:17:28 PM.    Final     Subjective: No new complaints.  Discharge Exam: Vitals:   05/24/19 0846 05/24/19 0900  BP:  (!) 144/75  Pulse:  93  Resp:  18  Temp:  97.7 F (36.5 C)  SpO2: 97% 97%   Vitals:   05/24/19 0402 05/24/19 0757 05/24/19 0846 05/24/19 0900  BP: 114/70   (!) 144/75  Pulse: 87   93  Resp: 16   18  Temp: 97.8 F (36.6 C)   97.7 F (36.5 C)  TempSrc: Oral   Oral  SpO2: 90% 96% 97% 97%  Weight:      Height:        General: Pt is alert, awake, not in acute distress Cardiovascular: RRR, S1/S2 +, no rubs, no gallops Respiratory: CTA bilaterally, no wheezing, no rhonchi Abdominal: Soft, NT, ND, bowel sounds + Extremities: no edema, no cyanosis    The results of  significant diagnostics from this hospitalization (including imaging, microbiology, ancillary and laboratory) are listed below for reference.     Microbiology: Recent Results (from the past 240 hour(s))  Respiratory Panel by RT PCR (Flu A&B, Covid) - Nasopharyngeal Swab     Status: None   Collection Time: 05/16/19  2:25 PM   Specimen: Nasopharyngeal Swab  Result Value Ref Range Status   SARS Coronavirus 2 by RT PCR NEGATIVE NEGATIVE Final    Comment: (NOTE) SARS-CoV-2 target nucleic acids are NOT DETECTED. The SARS-CoV-2 RNA is generally detectable in upper respiratoy specimens during the acute phase of infection. The  lowest concentration of SARS-CoV-2 viral copies this assay can detect is 131 copies/mL. A negative result does not preclude SARS-Cov-2 infection and should not be used as the sole basis for treatment or other patient management decisions. A negative result may occur with  improper specimen collection/handling, submission of specimen other than nasopharyngeal swab, presence of viral mutation(s) within the areas targeted by this assay, and inadequate number of viral copies (<131 copies/mL). A negative result must be combined with clinical observations, patient history, and epidemiological information. The expected result is Negative. Fact Sheet for Patients:  PinkCheek.be Fact Sheet for Healthcare Providers:  GravelBags.it This test is not yet ap proved or cleared by the [place] FDA and  has been authorized for detection and/or diagnosis of SARS-CoV-2 by FDA under an Emergency Use Authorization (EUA). This EUA will remain  in effect (meaning this test can be used) for the duration of the COVID-19 declaration under Section 564(b)(1) of the Act, 21 U.S.C. section 360bbb-3(b)(1), unless the authorization is terminated or revoked sooner.    Influenza A by PCR NEGATIVE NEGATIVE Final   Influenza B by PCR  NEGATIVE NEGATIVE Final    Comment: (NOTE) The Xpert Xpress SARS-CoV-2/FLU/RSV assay is intended as an aid in  the diagnosis of influenza from Nasopharyngeal swab specimens and  should not be used as a sole basis for treatment. Nasal washings and  aspirates are unacceptable for Xpert Xpress SARS-CoV-2/FLU/RSV  testing. Fact Sheet for Patients: PinkCheek.be Fact Sheet for Healthcare Providers: GravelBags.it This test is not yet approved or cleared by the [place] FDA and  has been authorized for detection and/or diagnosis of SARS-CoV-2 by  FDA under an Emergency Use Authorization (EUA). This EUA will remain  in effect (meaning this test can be used) for the duration of the  Covid-19 declaration under Section 564(b)(1) of the Act, 21  U.S.C. section 360bbb-3(b)(1), unless the authorization is  terminated or revoked. Performed at [facility], [place] [address], [place], [place] [address]      Labs: BNP (last 3 results) No results for input(s): BNP in the last 8760 hours. Basic Metabolic Panel: Recent Labs  Lab 05/18/19 0503  NA 138  K 3.7  CL 103  CO2 26  GLUCOSE 140*  BUN 7*  CREATININE 0.66  CALCIUM 9.0   Liver Function Tests: No results for input(s): AST, ALT, ALKPHOS, BILITOT, PROT, ALBUMIN in the last 168 hours. No results for input(s): LIPASE, AMYLASE in the last 168 hours. No results for input(s): AMMONIA in the last 168 hours. CBC: Recent Labs  Lab 05/20/19 0421 05/21/19 0408 05/22/19 0451 05/23/19 0417 05/24/19 0315  WBC 12.4* 10.4 9.5 11.0* 13.2*  HGB 12.8 12.5 12.3 12.9 12.6  HCT 38.3 37.1 36.9 38.3 37.7  MCV 97.2 98.1 99.5 98.7 97.4  PLT 251 258 248 265 294   Cardiac Enzymes: No results for input(s): CKTOTAL, CKMB, CKMBINDEX, TROPONINI in the last 168 hours. BNP: Invalid input(s): POCBNP CBG: Recent Labs  Lab 05/22/19 0901  GLUCAP 140*   D-Dimer No results for input(s):  DDIMER in the last 72 hours. Hgb A1c No results for input(s): HGBA1C in the last 72 hours. Lipid Profile No results for input(s): CHOL, HDL, LDLCALC, TRIG, CHOLHDL, LDLDIRECT in the last 72 hours. Thyroid function studies No results for input(s): TSH, T4TOTAL, T3FREE, THYROIDAB in the last 72 hours.  Invalid input(s): FREET3 Anemia work up No results for input(s): VITAMINB12, FOLATE, FERRITIN, TIBC, IRON, RETICCTPCT in the last 72 hours. Urinalysis  Component Value Date/Time   COLORURINE YELLOW 05/16/2019 1556   APPEARANCEUR CLEAR 05/16/2019 1556   LABSPEC >1.046 (H) 05/16/2019 1556   PHURINE 6.0 05/16/2019 1556   GLUCOSEU NEGATIVE 05/16/2019 1556   HGBUR NEGATIVE 05/16/2019 1556   BILIRUBINUR NEGATIVE 05/16/2019 1556   KETONESUR 5 (A) 05/16/2019 1556   PROTEINUR NEGATIVE 05/16/2019 1556   UROBILINOGEN 0.2 08/18/2014 1742   NITRITE NEGATIVE 05/16/2019 1556   LEUKOCYTESUR TRACE (A) 05/16/2019 1556   Sepsis Labs Invalid input(s): PROCALCITONIN,  WBC,  LACTICIDVEN Microbiology Recent Results (from the past 240 hour(s))  Respiratory Panel by RT PCR (Flu A&B, Covid) - Nasopharyngeal Swab     Status: None   Collection Time: 05/16/19  2:25 PM   Specimen: Nasopharyngeal Swab  Result Value Ref Range Status   SARS Coronavirus 2 by RT PCR NEGATIVE NEGATIVE Final    Comment: (NOTE) SARS-CoV-2 target nucleic acids are NOT DETECTED. The SARS-CoV-2 RNA is generally detectable in upper respiratoy specimens during the acute phase of infection. The lowest concentration of SARS-CoV-2 viral copies this assay can detect is 131 copies/mL. A negative result does not preclude SARS-Cov-2 infection and should not be used as the sole basis for treatment or other patient management decisions. A negative result may occur with  improper specimen collection/handling, submission of specimen other than nasopharyngeal swab, presence of viral mutation(s) within the areas targeted by this assay, and  inadequate number of viral copies (<131 copies/mL). A negative result must be combined with clinical observations, patient history, and epidemiological information. The expected result is Negative. Fact Sheet for Patients:  PinkCheek.be Fact Sheet for Healthcare Providers:  GravelBags.it This test is not yet ap proved or cleared by the [place] FDA and  has been authorized for detection and/or diagnosis of SARS-CoV-2 by FDA under an Emergency Use Authorization (EUA). This EUA will remain  in effect (meaning this test can be used) for the duration of the COVID-19 declaration under Section 564(b)(1) of the Act, 21 U.S.C. section 360bbb-3(b)(1), unless the authorization is terminated or revoked sooner.    Influenza A by PCR NEGATIVE NEGATIVE Final   Influenza B by PCR NEGATIVE NEGATIVE Final    Comment: (NOTE) The Xpert Xpress SARS-CoV-2/FLU/RSV assay is intended as an aid in  the diagnosis of influenza from Nasopharyngeal swab specimens and  should not be used as a sole basis for treatment. Nasal washings and  aspirates are unacceptable for Xpert Xpress SARS-CoV-2/FLU/RSV  testing. Fact Sheet for Patients: PinkCheek.be Fact Sheet for Healthcare Providers: GravelBags.it This test is not yet approved or cleared by the [place] FDA and  has been authorized for detection and/or diagnosis of SARS-CoV-2 by  FDA under an Emergency Use Authorization (EUA). This EUA will remain  in effect (meaning this test can be used) for the duration of the  Covid-19 declaration under Section 564(b)(1) of the Act, 21  U.S.C. section 360bbb-3(b)(1), unless the authorization is  terminated or revoked. Performed at [facility], [place] [address] [place], [place] [address]      Time coordinating discharge: Over 40 minutes  SIGNED:   Charlynne Cousins, MD  [facility] 05/24/2019, 11:24 AM Pager   If 7PM-7AM, please contact night-coverage [email or web address] Password [email or web address]

## 2019-05-23 NOTE — Progress Notes (Signed)
Occupational Therapy Treatment Patient Details Name: Beth [name] MRN: [id number] DOB: 1940/06/22 Today's Date: 05/23/2019    History of present illness Pt is a 79 yo female admitted with sudden onset L side weakness, difficulty walking. 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  Pt with COPD and on home O2.   OT comments  Pt making progress in therapy, demonstrating improved activity tolerance and attention to left side. Pt completed toileting task with BSC, unable to maintain standing balance to complete peri-care following a BM. Pt ambulated to bedroom sink with RW and mod assist x 2. Pt required assist to maintain LUE on RW as well as one moderate seated rest break due to SOB and fatigue. SpO2 maintained in 90s on 3L [place]. Pt able to wash hands at sink, resting forearms on counter, and incorporating use of LUE into task. Pt required mod assist to maintain standing balance at the sink. Pt tolerated standing 1 x 4 min and 1 x 4.5 min during therapy session this date. Continued to educate and encourage pt to use LUE throughout the day with good understanding. OT will continue to follow acutely. Continue to recommend CIR for additional rehab following hospital discharge.    Follow Up Recommendations  CIR;Supervision/Assistance - 24 hour    Equipment Recommendations  3 in 1 bedside commode    Recommendations for Other Services      Precautions / Restrictions Precautions Precautions: Fall Precaution Comments: Left inattention - improving Restrictions Weight Bearing Restrictions: No       Mobility Bed Mobility Overal bed mobility: Needs Assistance Bed Mobility: Supine to Sit     Supine to sit: Min guard;HOB elevated     General bed mobility comments: To ensure balance and safety. Cues to utilize LUE.   Transfers Overall transfer level: Needs assistance Equipment used: Rolling walker (2 wheeled) Transfers: Sit to/from [employer] Sit to Stand: Mod assist;+2 physical assistance;+2 safety/equipment Stand pivot transfers: Mod assist;+2 physical assistance;+2 safety/equipment       General transfer comment: Assist to power up into standing. Cues for hand placement and incorporation of LUE. Assist to maintain LUE on RW.     Balance Overall balance assessment: Needs assistance Sitting-balance support: No upper extremity supported;Feet supported Sitting balance-Leahy Scale: Fair     Standing balance support: Bilateral upper extremity supported;During functional activity Standing balance-Leahy Scale: Poor                             ADL either performed or assessed with clinical judgement   ADL Overall ADL's : Needs assistance/impaired     Grooming: Wash/dry hands;Moderate assistance;Standing Grooming Details (indicate cue type and reason): While standing at the sink. Pt rested forearms on counter. Mod assist for balance. Pt able to incorporate LUE into task.              Lower Body Dressing: Total assistance Lower Body Dressing Details (indicate cue type and reason): To don socks Toilet Transfer: Moderate assistance;Stand-pivot;BSC;+2 for physical assistance;+2 for safety/equipment(to strong side)   Toileting- Clothing Manipulation and Hygiene: Maximal assistance;+2 for safety/equipment Toileting - Clothing Manipulation Details (indicate cue type and reason): Max assist for peri-care in standing following BM.      Functional mobility during ADLs: Moderate assistance;+2 for physical assistance;+2 for safety/equipment;Rolling walker General ADL Comments: Pt transferred to/from [facility] and ambulated to bedroom sink with RW  and mod assist x 2. Pt required assist to maintain LUE on RW during mobility. Pt tolerated standing 1 x 4 min and 1 x 4.5 min during tasks.      Vision       Perception     Praxis      Cognition Arousal/Alertness: Awake/alert Behavior During Therapy: WFL for  tasks assessed/performed Overall Cognitive Status: Within Functional Limits for tasks assessed                                 General Comments: Pt pleasant and willing to participate in therapy. Continued left inattention, however improving.         Exercises     Shoulder Instructions       General Comments Pt on 3L [place] with SpO2 maintaining in 90s.     Pertinent Vitals/ Pain       Pain Assessment: 0-10 Pain Score: 10-Worst pain ever Pain Location: R shoulder Pain Descriptors / Indicators: Grimacing;Guarding Pain Intervention(s): Limited activity within patient's tolerance;Monitored during session;Repositioned  Home Living                           Bathroom Accessibility: Yes How Accessible: Accessible via walker        Lives With: Spouse    Prior Functioning/Environment              Frequency           Progress Toward Goals  OT Goals(current goals can now be found in the care plan section)  Progress towards OT goals: Progressing toward goals  ADL Goals Pt Will Perform Eating: with set-up;sitting Pt Will Perform Grooming: with set-up;sitting Pt Will Perform Upper Body Bathing: with set-up;sitting Pt Will Perform Upper Body Dressing: with set-up;sitting Pt Will Transfer to Toilet: with min guard assist;stand pivot transfer;bedside commode Pt Will Perform Toileting - Clothing Manipulation and hygiene: with min guard assist;sitting/lateral leans Additional ADL Goal #1: Pt will tend to L arm to make sure it is in her line of sight for safety reasons with minimal verbal cues.  Plan Discharge plan remains appropriate    Co-evaluation    PT/OT/SLP Co-Evaluation/Treatment: Yes Reason for Co-Treatment: For patient/therapist safety;To address functional/ADL transfers   OT goals addressed during session: ADL's and self-care;Strengthening/ROM      AM-PAC OT "6 Clicks" Daily Activity     Outcome Measure   Help from another person  eating meals?: A Little Help from another person taking care of personal grooming?: A Little Help from another person toileting, which includes using toliet, bedpan, or urinal?: A Lot Help from another person bathing (including washing, rinsing, drying)?: A Lot Help from another person to put on and taking off regular upper body clothing?: A Lot Help from another person to put on and taking off regular lower body clothing?: A Lot 6 Click Score: 14    End of Session Equipment Utilized During Treatment: Gait belt;Rolling walker;Oxygen  OT Visit Diagnosis: Unsteadiness on feet (R26.81);Other abnormalities of gait and mobility (R26.89);Other symptoms and signs involving the nervous system (R29.898)   Activity Tolerance Patient tolerated treatment well   Patient Left in chair;with call bell/phone within reach;with chair alarm set;with family/visitor present   Nurse Communication Mobility status        Time: [id number] OT Time Calculation (min): 45 min  Charges: OT General Charges $OT Visit: 1 Visit OT Treatments $Self  Care/Home Management : 8-22 mins $Therapeutic Activity: 8-22 mins  Mauri Brooklyn OTR/L [place] 05/23/2019, 1:18 PM

## 2019-05-23 NOTE — Plan of Care (Signed)

## 2019-05-24 ENCOUNTER — Encounter (HOSPITAL_COMMUNITY): Payer: Self-pay | Admitting: Physical Medicine & Rehabilitation

## 2019-05-24 ENCOUNTER — Other Ambulatory Visit: Payer: Self-pay

## 2019-05-24 ENCOUNTER — Inpatient Hospital Stay (HOSPITAL_COMMUNITY)
Admission: RE | Admit: 2019-05-24 | Discharge: 2019-06-06 | DRG: 057 | Disposition: A | Discharge status: Home Health | Payer: Medicare Other | Source: Intra-hospital | Attending: Physical Medicine & Rehabilitation | Admitting: Physical Medicine & Rehabilitation

## 2019-05-24 DIAGNOSIS — Z803 Family history of malignant neoplasm of breast: Secondary | ICD-10-CM

## 2019-05-24 DIAGNOSIS — I4891 Unspecified atrial fibrillation: Secondary | ICD-10-CM | POA: Diagnosis present

## 2019-05-24 DIAGNOSIS — I6521 Occlusion and stenosis of right carotid artery: Secondary | ICD-10-CM | POA: Diagnosis present

## 2019-05-24 DIAGNOSIS — Z823 Family history of stroke: Secondary | ICD-10-CM | POA: Diagnosis not present

## 2019-05-24 DIAGNOSIS — H9190 Unspecified hearing loss, unspecified ear: Secondary | ICD-10-CM | POA: Diagnosis present

## 2019-05-24 DIAGNOSIS — J441 Chronic obstructive pulmonary disease with (acute) exacerbation: Secondary | ICD-10-CM | POA: Diagnosis present

## 2019-05-24 DIAGNOSIS — Z882 Allergy status to sulfonamides status: Secondary | ICD-10-CM

## 2019-05-24 DIAGNOSIS — R062 Wheezing: Secondary | ICD-10-CM

## 2019-05-24 DIAGNOSIS — S93402A Sprain of unspecified ligament of left ankle, initial encounter: Secondary | ICD-10-CM | POA: Diagnosis present

## 2019-05-24 DIAGNOSIS — Z9981 Dependence on supplemental oxygen: Secondary | ICD-10-CM | POA: Diagnosis not present

## 2019-05-24 DIAGNOSIS — I63511 Cerebral infarction due to unspecified occlusion or stenosis of right middle cerebral artery: Secondary | ICD-10-CM | POA: Diagnosis present

## 2019-05-24 DIAGNOSIS — Z825 Family history of asthma and other chronic lower respiratory diseases: Secondary | ICD-10-CM | POA: Diagnosis not present

## 2019-05-24 DIAGNOSIS — K5790 Diverticulosis of intestine, part unspecified, without perforation or abscess without bleeding: Secondary | ICD-10-CM | POA: Diagnosis present

## 2019-05-24 DIAGNOSIS — I69354 Hemiplegia and hemiparesis following cerebral infarction affecting left non-dominant side: Principal | ICD-10-CM

## 2019-05-24 DIAGNOSIS — Z7901 Long term (current) use of anticoagulants: Secondary | ICD-10-CM | POA: Diagnosis not present

## 2019-05-24 DIAGNOSIS — D72829 Elevated white blood cell count, unspecified: Secondary | ICD-10-CM | POA: Diagnosis not present

## 2019-05-24 DIAGNOSIS — I10 Essential (primary) hypertension: Secondary | ICD-10-CM | POA: Diagnosis present

## 2019-05-24 DIAGNOSIS — Z881 Allergy status to other antibiotic agents status: Secondary | ICD-10-CM

## 2019-05-24 DIAGNOSIS — Z7902 Long term (current) use of antithrombotics/antiplatelets: Secondary | ICD-10-CM | POA: Diagnosis not present

## 2019-05-24 DIAGNOSIS — Z1501 Genetic susceptibility to malignant neoplasm of breast: Secondary | ICD-10-CM

## 2019-05-24 DIAGNOSIS — J449 Chronic obstructive pulmonary disease, unspecified: Secondary | ICD-10-CM | POA: Diagnosis present

## 2019-05-24 DIAGNOSIS — K5909 Other constipation: Secondary | ICD-10-CM | POA: Diagnosis present

## 2019-05-24 DIAGNOSIS — J9611 Chronic respiratory failure with hypoxia: Secondary | ICD-10-CM | POA: Diagnosis present

## 2019-05-24 DIAGNOSIS — M19072 Primary osteoarthritis, left ankle and foot: Secondary | ICD-10-CM

## 2019-05-24 DIAGNOSIS — Z888 Allergy status to other drugs, medicaments and biological substances status: Secondary | ICD-10-CM | POA: Diagnosis not present

## 2019-05-24 DIAGNOSIS — Z87891 Personal history of nicotine dependence: Secondary | ICD-10-CM | POA: Diagnosis not present

## 2019-05-24 DIAGNOSIS — E871 Hypo-osmolality and hyponatremia: Secondary | ICD-10-CM | POA: Diagnosis not present

## 2019-05-24 DIAGNOSIS — Z7982 Long term (current) use of aspirin: Secondary | ICD-10-CM

## 2019-05-24 DIAGNOSIS — F329 Major depressive disorder, single episode, unspecified: Secondary | ICD-10-CM | POA: Diagnosis present

## 2019-05-24 DIAGNOSIS — R569 Unspecified convulsions: Secondary | ICD-10-CM

## 2019-05-24 DIAGNOSIS — Z79899 Other long term (current) drug therapy: Secondary | ICD-10-CM

## 2019-05-24 LAB — CBC
HCT: 37.7 % (ref 36.0–46.0)
Hemoglobin: 12.6 g/dL (ref 12.0–15.0)
MCH: 32.6 pg (ref 26.0–34.0)
MCHC: 33.4 g/dL (ref 30.0–36.0)
MCV: 97.4 fL (ref 80.0–100.0)
Platelets: 294 10*3/uL (ref 150–400)
RBC: 3.87 MIL/uL (ref 3.87–5.11)
RDW: 12.2 % (ref 11.5–15.5)
WBC: 13.2 10*3/uL — ABNORMAL HIGH (ref 4.0–10.5)
nRBC: 0 % (ref 0.0–0.2)

## 2019-05-24 MED ORDER — TRAZODONE HCL 50 MG PO TABS
25.0000 mg | ORAL_TABLET | Freq: Every evening | ORAL | Status: DC | PRN
  Administered 2019-05-31 – 2019-06-02 (×3): 50 mg via ORAL
  Filled 2019-05-24 (×5): qty 1

## 2019-05-24 MED ORDER — ESCITALOPRAM OXALATE 10 MG PO TABS
20.0000 mg | ORAL_TABLET | Freq: Every day | ORAL | Status: DC
  Administered 2019-05-25 – 2019-06-06 (×13): 20 mg via ORAL
  Filled 2019-05-24 (×13): qty 2

## 2019-05-24 MED ORDER — CLONAZEPAM 0.5 MG PO TABS
2.0000 mg | ORAL_TABLET | Freq: Every day | ORAL | Status: DC
  Administered 2019-05-24 – 2019-06-05 (×13): 2 mg via ORAL
  Filled 2019-05-24 (×13): qty 4

## 2019-05-24 MED ORDER — POLYETHYLENE GLYCOL 3350 17 G PO PACK
17.0000 g | PACK | Freq: Every day | ORAL | Status: DC
  Administered 2019-05-25 – 2019-06-05 (×8): 17 g via ORAL
  Filled 2019-05-24 (×12): qty 1

## 2019-05-24 MED ORDER — CAPTOPRIL 25 MG PO TABS
50.0000 mg | ORAL_TABLET | Freq: Two times a day (BID) | ORAL | Status: DC
  Administered 2019-05-24 – 2019-06-05 (×25): 50 mg via ORAL
  Filled 2019-05-24 (×27): qty 2

## 2019-05-24 MED ORDER — METOPROLOL TARTRATE 5 MG/5ML IV SOLN
5.0000 mg | Freq: Four times a day (QID) | INTRAVENOUS | Status: DC | PRN
  Filled 2019-05-24: qty 5

## 2019-05-24 MED ORDER — CAPTOPRIL 50 MG PO TABS
50.0000 mg | ORAL_TABLET | Freq: Two times a day (BID) | ORAL | Status: DC
  Filled 2019-05-24: qty 1

## 2019-05-24 MED ORDER — PROCHLORPERAZINE MALEATE 5 MG PO TABS: 5.0000 mg | ORAL_TABLET | Freq: Four times a day (QID) | ORAL | Status: DC | PRN

## 2019-05-24 MED ORDER — ATORVASTATIN CALCIUM 20 MG PO TABS
40.0000 mg | ORAL_TABLET | Freq: Every day | ORAL | 11 refills | Status: DC
Start: 2019-05-24 — End: 2019-06-10

## 2019-05-24 MED ORDER — ATORVASTATIN CALCIUM 40 MG PO TABS
40.0000 mg | ORAL_TABLET | Freq: Every day | ORAL | Status: DC
  Administered 2019-05-25 – 2019-06-06 (×13): 40 mg via ORAL
  Filled 2019-05-24 (×13): qty 1

## 2019-05-24 MED ORDER — VITAMIN D 25 MCG (1000 UNIT) PO TABS
2000.0000 [IU] | ORAL_TABLET | Freq: Every day | ORAL | Status: DC
  Administered 2019-05-25 – 2019-06-06 (×13): 2000 [IU] via ORAL
  Filled 2019-05-24 (×13): qty 2

## 2019-05-24 MED ORDER — BISACODYL 10 MG RE SUPP: 10.0000 mg | Freq: Every day | RECTAL | Status: DC | PRN

## 2019-05-24 MED ORDER — VITAMIN B-12 1000 MCG PO TABS
500.0000 ug | ORAL_TABLET | Freq: Every day | ORAL | Status: DC
  Administered 2019-05-25 – 2019-06-06 (×13): 500 ug via ORAL
  Filled 2019-05-24 (×13): qty 1

## 2019-05-24 MED ORDER — LEVETIRACETAM 750 MG PO TABS
750.0000 mg | ORAL_TABLET | Freq: Two times a day (BID) | ORAL | Status: DC
  Administered 2019-05-24 – 2019-06-06 (×26): 750 mg via ORAL
  Filled 2019-05-24 (×26): qty 1

## 2019-05-24 MED ORDER — GUAIFENESIN-DM 100-10 MG/5ML PO SYRP
5.0000 mL | ORAL_SOLUTION | Freq: Four times a day (QID) | ORAL | Status: DC | PRN
  Administered 2019-05-29: 10 mL via ORAL
  Filled 2019-05-24 (×2): qty 10

## 2019-05-24 MED ORDER — FLEET ENEMA 7-19 GM/118ML RE ENEM: 1.0000 | ENEMA | Freq: Once | RECTAL | Status: DC | PRN

## 2019-05-24 MED ORDER — CLOPIDOGREL BISULFATE 75 MG PO TABS
75.0000 mg | ORAL_TABLET | Freq: Every day | ORAL | Status: DC
  Administered 2019-05-25 – 2019-06-06 (×13): 75 mg via ORAL
  Filled 2019-05-24 (×13): qty 1

## 2019-05-24 MED ORDER — POLYETHYLENE GLYCOL 3350 17 G PO PACK
17.0000 g | PACK | Freq: Every day | ORAL | Status: DC | PRN
  Filled 2019-05-24: qty 1

## 2019-05-24 MED ORDER — ASPIRIN EC 81 MG PO TBEC
81.0000 mg | DELAYED_RELEASE_TABLET | Freq: Every day | ORAL | Status: DC
  Administered 2019-05-25 – 2019-06-06 (×13): 81 mg via ORAL
  Filled 2019-05-24 (×13): qty 1

## 2019-05-24 MED ORDER — PROCHLORPERAZINE EDISYLATE 10 MG/2ML IJ SOLN: 5.0000 mg | Freq: Four times a day (QID) | INTRAMUSCULAR | Status: DC | PRN

## 2019-05-24 MED ORDER — SALINE SPRAY 0.65 % NA SOLN: 1.0000 | NASAL | Status: DC | PRN

## 2019-05-24 MED ORDER — PANTOPRAZOLE SODIUM 40 MG PO TBEC
40.0000 mg | DELAYED_RELEASE_TABLET | Freq: Two times a day (BID) | ORAL | Status: DC
  Administered 2019-05-24 – 2019-06-06 (×26): 40 mg via ORAL
  Filled 2019-05-24 (×26): qty 1

## 2019-05-24 MED ORDER — APIXABAN 5 MG PO TABS
5.0000 mg | ORAL_TABLET | Freq: Two times a day (BID) | ORAL | Status: DC
  Administered 2019-05-24 – 2019-06-06 (×26): 5 mg via ORAL
  Filled 2019-05-24 (×26): qty 1

## 2019-05-24 MED ORDER — DIPHENHYDRAMINE HCL 12.5 MG/5ML PO ELIX: 12.5000 mg | ORAL_SOLUTION | Freq: Four times a day (QID) | ORAL | Status: DC | PRN

## 2019-05-24 MED ORDER — ACETAMINOPHEN 325 MG PO TABS
325.0000 mg | ORAL_TABLET | ORAL | Status: DC | PRN
  Administered 2019-05-25 – 2019-06-05 (×11): 650 mg via ORAL
  Filled 2019-05-24 (×13): qty 2

## 2019-05-24 MED ORDER — DILTIAZEM HCL ER COATED BEADS 300 MG PO CP24
300.0000 mg | ORAL_CAPSULE | Freq: Every day | ORAL | Status: DC
  Administered 2019-05-25 – 2019-06-06 (×13): 300 mg via ORAL
  Filled 2019-05-24 (×13): qty 1

## 2019-05-24 MED ORDER — ALUM & MAG HYDROXIDE-SIMETH 200-200-20 MG/5ML PO SUSP: 30.0000 mL | ORAL | Status: DC | PRN

## 2019-05-24 MED ORDER — TRIAMTERENE-HCTZ 37.5-25 MG PO TABS
1.0000 | ORAL_TABLET | Freq: Every day | ORAL | Status: DC
  Administered 2019-05-25 – 2019-06-03 (×10): 1 via ORAL
  Filled 2019-05-24 (×10): qty 1

## 2019-05-24 MED ORDER — PROCHLORPERAZINE 25 MG RE SUPP: 12.5000 mg | Freq: Four times a day (QID) | RECTAL | Status: DC | PRN

## 2019-05-24 NOTE — Discharge Instructions (Signed)

## 2019-05-24 NOTE — Progress Notes (Signed)
Resting comfortably without complaints.  Tele on.  In afib, as has been the case throughout the shift.  No chest pain or complaints.

## 2019-05-24 NOTE — TOC Benefit Eligibility Note (Signed)
Transition of Care [facility]) Benefit Eligibility Note    Patient Details  Name: Beth [name] MRN: [id number] Date of Birth: 08-01-1940   Medication/Dose: Arne [place]  5 MG BIS  Covered?: Yes  Tier: (NO TIER)  Prescription Coverage [facility]  [facility]  Spoke with Person/Company/Phone Number:: [facility]   @ [employer] # [phone]  Co-Pay: $33.00  Prior Approval: No  Deductible: Unmet  Additional Notes: 90 DAY SUPPLY FOR M/O $ 29.00    Memory Argue Phone Number: 05/24/2019, 10:24 AM

## 2019-05-24 NOTE — Plan of Care (Signed)
  Problem: Education: Goal: Knowledge of General Education information will improve Description: Including pain rating scale, medication(s)/side effects and non-pharmacologic comfort measures Outcome: Progressing   Problem: Activity: Goal: Risk for activity intolerance will decrease Outcome: Progressing   Problem: Safety: Goal: Ability to remain free from injury will improve Outcome: Progressing   Problem: Education: Goal: Knowledge of disease or condition will improve Outcome: Progressing

## 2019-05-24 NOTE — Progress Notes (Signed)
[facility] PROGRESS NOTE    Progress Note  DARE BULNES  [id number] DOB: [birth year] DOA: 05/16/2019 PCP: Kathyrn Lass, MD     Brief Narrative:   NIYAH FLAMMER is an 79 y.o. female past medical history significant for hypertension, depression aneurysmal intracranial hemorrhage in 2008, seizure disorder secondary to [place] chronic respiratory failure on 2 L of oxygen secondary to COPD comes in for left-sided weakness found to have a stroke  Assessment/Plan:   CVA (cerebral vascular accident) ([place]) We will transition IV heparin to [place]. Continue Plavix, she needs to on Plavix 72 hours prior to procedure. Awaiting inpatient rehab admission no events overnight patient is medically stable transfer.  New onset atrial fibrillation: On oral diltiazem continue IV metoprolol as needed. Continue Eliquis 48 hours prior to procedure, vascular surgery to dictate when can be restarted.  Seizure disorder: Continue Keppra. No seizure events.  Essential hypertension: Stable holding home meds atenolol captopril triamterene/hydrochlorothiazide for now allow permissive hypertension. Blood pressure is ranging 135/72 129/70  Hyperlipidemia: Continue statin.  Depression/anxiety: Continue Lexapro and Klonopin  Chronic respiratory failure on 2 L of oxygen due to COPD: Stable.  Low normal B12: Continue oral replacement.    DVT prophylaxis: lovenox Family Communication:husband Status is: Inpatient  Remains inpatient appropriate because:Hemodynamically unstable   Dispo: The patient is from: Home              Anticipated d/c is to: [facility]              Anticipated d/c date is: 1 days              Patient currently adequately stable for transfer hopefully can go to inpatient rehab on 05/23/2019.  Code Status:     Code Status Orders  (From admission, onward)         Start     Ordered   05/16/19 1301  Full code  Continuous     05/16/19 1301        Code Status History    This patient has a current code status but no historical code status.   Advance Care Planning Activity    Advance Directive Documentation     Most Recent Value  Type of Advance Directive  Healthcare Power of Attorney  Pre-existing out of facility DNR order (yellow form or pink MOST form)  --  "MOST" Form in Place?  --        IV Access:    Peripheral IV   Procedures and diagnostic studies:   DG Ankle 2 Views Left  Result Date: 05/22/2019 CLINICAL DATA:  Left ankle pain and swelling EXAM: LEFT ANKLE - 2 VIEW COMPARISON:  None. FINDINGS: There is soft tissue swelling about the ankle. There is no acute displaced fracture. No dislocation. There is a moderate-sized plantar calcaneal spur. There is an Achilles tendon enthesophyte. Degenerative changes are noted of the midfoot. IMPRESSION: Soft tissue swelling without acute fracture or dislocation. Electronically Signed   By: Constance Holster M.D.   On: 05/22/2019 16:56   DG Foot 2 Views Left  Result Date: 05/22/2019 CLINICAL DATA:  Pain and swelling. EXAM: LEFT FOOT - 2 VIEW COMPARISON:  None. FINDINGS: There is nonspecific soft tissue swelling about the foot. There are degenerative changes of the first metatarsophalangeal joint. There is lucency at the head of the first metatarsal which may be secondary to underlying degenerative changes. There is a moderate-sized plantar calcaneal spur. IMPRESSION: 1. No acute displaced fracture or dislocation. 2. There  is nonspecific soft tissue swelling about the foot. Electronically Signed   By: Constance Holster M.D.   On: 05/22/2019 16:57     Medical Consultants:    None.  Anti-Infectives:   none  Subjective:    Glynis Smiles Vara no new complaints feels great.  Objective:    Vitals:   05/24/19 0402 05/24/19 0757 05/24/19 0846 05/24/19 0900  BP: 114/70   (!) 144/75  Pulse: 87   93  Resp: 16   18  Temp: 97.8 F (36.6 C)   97.7 F (36.5 C)  TempSrc: Oral   Oral  SpO2: 90% 96% 97%  97%  Weight:      Height:       SpO2: 97 % O2 Flow Rate (L/min): 2 L/min   Intake/Output Summary (Last 24 hours) at 05/24/2019 1104 Last data filed at 05/24/2019 1001 Gross per 24 hour  Intake --  Output 2950 ml  Net -2950 ml   Filed Weights   05/16/19 1142  Weight: 108.6 kg    Exam: General exam: In no acute distress. Respiratory system: Good air movement and clear to auscultation. Cardiovascular system: S1 & S2 heard, RRR. No JVD. Gastrointestinal system: Abdomen is nondistended, soft and nontender.  Central nervous system: Alert and oriented.  Extremities: No pedal edema. Skin: No rashes, lesions or ulcers  Data Reviewed:    Labs: Basic Metabolic Panel: Recent Labs  Lab 05/18/19 0503  NA 138  K 3.7  CL 103  CO2 26  GLUCOSE 140*  BUN 7*  CREATININE 0.66  CALCIUM 9.0   GFR Estimated Creatinine Clearance: 70.9 mL/min (by C-G formula based on SCr of 0.66 mg/dL). Liver Function Tests: No results for input(s): AST, ALT, ALKPHOS, BILITOT, PROT, ALBUMIN in the last 168 hours. No results for input(s): LIPASE, AMYLASE in the last 168 hours. No results for input(s): AMMONIA in the last 168 hours. Coagulation profile No results for input(s): INR, PROTIME in the last 168 hours. COVID-19 Labs  No results for input(s): DDIMER, FERRITIN, LDH, CRP in the last 72 hours.  Lab Results  Component Value Date   [place] NEGATIVE 05/16/2019    CBC: Recent Labs  Lab 05/20/19 0421 05/21/19 0408 05/22/19 0451 05/23/19 0417 05/24/19 0315  WBC 12.4* 10.4 9.5 11.0* 13.2*  HGB 12.8 12.5 12.3 12.9 12.6  HCT 38.3 37.1 36.9 38.3 37.7  MCV 97.2 98.1 99.5 98.7 97.4  PLT 251 258 248 265 294   Cardiac Enzymes: No results for input(s): CKTOTAL, CKMB, CKMBINDEX, TROPONINI in the last 168 hours. BNP (last 3 results) No results for input(s): PROBNP in the last 8760 hours. CBG: Recent Labs  Lab 05/22/19 0901  GLUCAP 140*   D-Dimer: No results for input(s): DDIMER in  the last 72 hours. Hgb A1c: No results for input(s): HGBA1C in the last 72 hours. Lipid Profile: No results for input(s): CHOL, HDL, LDLCALC, TRIG, CHOLHDL, LDLDIRECT in the last 72 hours. Thyroid function studies: No results for input(s): TSH, T4TOTAL, T3FREE, THYROIDAB in the last 72 hours.  Invalid input(s): FREET3 Anemia work up: No results for input(s): VITAMINB12, FOLATE, FERRITIN, TIBC, IRON, RETICCTPCT in the last 72 hours. Sepsis Labs: Recent Labs  Lab 05/21/19 0408 05/22/19 0451 05/23/19 0417 05/24/19 0315  WBC 10.4 9.5 11.0* 13.2*   Microbiology Recent Results (from the past 240 hour(s))  Respiratory Panel by RT PCR (Flu A&B, Covid) - Nasopharyngeal Swab     Status: None   Collection Time: 05/16/19  2:25 PM  Specimen: Nasopharyngeal Swab  Result Value Ref Range Status   SARS Coronavirus 2 by RT PCR NEGATIVE NEGATIVE Final    Comment: (NOTE) SARS-CoV-2 target nucleic acids are NOT DETECTED. The SARS-CoV-2 RNA is generally detectable in upper respiratoy specimens during the acute phase of infection. The lowest concentration of SARS-CoV-2 viral copies this assay can detect is 131 copies/mL. A negative result does not preclude SARS-Cov-2 infection and should not be used as the sole basis for treatment or other patient management decisions. A negative result may occur with  improper specimen collection/handling, submission of specimen other than nasopharyngeal swab, presence of viral mutation(s) within the areas targeted by this assay, and inadequate number of viral copies (<131 copies/mL). A negative result must be combined with clinical observations, patient history, and epidemiological information. The expected result is Negative. Fact Sheet for Patients:  PinkCheek.be Fact Sheet for Healthcare Providers:  GravelBags.it This test is not yet ap proved or cleared by the [place] FDA and  has been  authorized for detection and/or diagnosis of SARS-CoV-2 by FDA under an Emergency Use Authorization (EUA). This EUA will remain  in effect (meaning this test can be used) for the duration of the COVID-19 declaration under Section 564(b)(1) of the Act, 21 U.S.C. section 360bbb-3(b)(1), unless the authorization is terminated or revoked sooner.    Influenza A by PCR NEGATIVE NEGATIVE Final   Influenza B by PCR NEGATIVE NEGATIVE Final    Comment: (NOTE) The Xpert Xpress SARS-CoV-2/FLU/RSV assay is intended as an aid in  the diagnosis of influenza from Nasopharyngeal swab specimens and  should not be used as a sole basis for treatment. Nasal washings and  aspirates are unacceptable for Xpert Xpress SARS-CoV-2/FLU/RSV  testing. Fact Sheet for Patients: PinkCheek.be Fact Sheet for Healthcare Providers: GravelBags.it This test is not yet approved or cleared by the [place] FDA and  has been authorized for detection and/or diagnosis of SARS-CoV-2 by  FDA under an Emergency Use Authorization (EUA). This EUA will remain  in effect (meaning this test can be used) for the duration of the  Covid-19 declaration under Section 564(b)(1) of the Act, 21  U.S.C. section 360bbb-3(b)(1), unless the authorization is  terminated or revoked. Performed at [facility], [place] [address], [place], [place] [address]      Medications:   . apixaban  5 mg Oral BID  . aspirin EC  81 mg Oral Daily  . atorvastatin  40 mg Oral Daily  . captopril  50 mg Oral BID  . cholecalciferol  2,000 Units Oral Daily  . clonazePAM  2 mg Oral QHS  . clopidogrel  75 mg Oral Daily  . diltiazem  300 mg Oral Daily  . escitalopram  20 mg Oral Daily  . levETIRAcetam  750 mg Oral BID  . pantoprazole  40 mg Oral BID  . polyethylene glycol  17 g Oral Daily  . Tiotropium Bromide-Olodaterol  2 puff Inhalation Daily  . triamterene-hydrochlorothiazide  1 tablet Oral  Daily  . vitamin B-12  500 mcg Oral Daily   Continuous Infusions:     LOS: 8 days   Charlynne Cousins  [facility]  05/24/2019, 11:04 AM

## 2019-05-24 NOTE — Progress Notes (Signed)
Cristina Gong, RN  Rehab Admission Coordinator  Physical Medicine and Rehabilitation  PMR Pre-admission     Signed  Date of Service:  05/23/2019 11:23 AM      Related encounter: ED to Hosp-Admission (Current) from 05/16/2019 in [facility]      Signed        Show:Clear all '[x]' Manual'[x]' Template'[x]' Copied  Added by: '[x]' Cristina Gong, RN  '[]' Hover for details PMR Admission Coordinator Pre-Admission Assessment   Patient: Beth [name] is an 79 y.o., female MRN: [id number] DOB: [birth year] Height: '5\' 4"'  (162.6 cm) Weight: 108.6 kg                                                                                                                                                  Insurance Information   PRIMARY: Medicare a and b      Policy#: [id number]      Subscriber: pt Benefits:  Phone #: passport one online     Name: 5/5 Eff. Date: 08/14/2006     Deduct: $1484      Out of Pocket Max: none      Life Max: none  CIR: 100%      SNF: 20 full days Outpatient: 80%     Co-Pay: 205 Home Health: 100%     Co-Pay: none DME: 80%     Co-Pay: 20% Providers: pt choice  SECONDARY: Tricare for Life      Policy#: [id number]      Phone#: pt   The "Data Collection Information Summary" for patients in Inpatient Rehabilitation Facilities with attached "Privacy Act [place] Records" was provided and verbally reviewed with: Patient and Family   Emergency Contact Information         Contact Information     Name Relation Home Work Mobile    Adona E Spouse [phone]        Priti, [place] Daughter     [phone]       Current Medical History  Patient Admitting Diagnosis: right MCA infarct   History of present Illness: YAZLYN WENTZEL is a 79 y.o. 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 CT head negative. 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   MRI brain done revealing small acute R-MCA infarct involving frontoparietal region and chronic large right temporal lobe infarct. Dr. Erlinda Hong felt that stroke was embolic due to secondary to R-ICA stenosis and no DAPT as question that round hyper density at temporal lobe could be thrombosed aneurysm. On ASA for secondary stroke prevention.    [facility] consulted for input on ICA stenosis and Dr. Scot Dock reviewed case with Dr. Donzetta Matters  and plan trans carotid stenting on 06/07/2019. She is on Eliquis. Will need to stop Eliquis  48 hrs preoperatively.      She did develop A fib with RVR  requiring IV metoprolol and IV Cardizem. Has since transitioned to po. She was continued on Hydrochlorothiazide and triamterene.  She did complain of pain in th left foot. Xrays of the ankle and foot were unremarkable.Therapy evaluations completed revealing functional deficits due to left hemiparesis with sensory deficits and left inattention.      Complete NIHSS TOTAL: 0 Glasgow Coma Scale Score: 15   Past Medical History      Past Medical History:  Diagnosis Date  . BRCA2 positive 2017  . Diverticulosis    . Hearing loss    . HTN (hypertension)    . Memory loss    . Seizure disorder, complex partial, 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[place]) seizure free for one year  . Seizures ([place])    . 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   . Vestibulitis of ear        Family History  family history includes Bleeding Disorder in her maternal grandfather; Breast cancer (age of onset: 54) in her daughter; Colon cancer in her maternal aunt; Dementia in her maternal grandfather and mother; Emphysema in her maternal aunt, maternal aunt, and maternal uncle; High blood pressure in her maternal grandmother; Osteoporosis in her mother; Other in her daughter and daughter; Ovarian cancer (age of onset: 26) in her  paternal grandmother; Ovarian cancer (age of onset: 70) in her paternal aunt; Stroke in her mother; Thyroid disease in her mother.   Prior Rehab/Hospitalizations:  Has the patient had prior rehab or hospitalizations prior to admission? Yes   Has the patient had major surgery during 100 days prior to admission? No   Current Medications    Current Facility-Administered Medications:  .  acetaminophen (TYLENOL) tablet 650 mg, 650 mg, Oral, Q4H PRN, 650 mg at 05/22/19 2016 **OR** acetaminophen (TYLENOL) 160 MG/5ML solution 650 mg, 650 mg, Per Tube, Q4H PRN **OR** acetaminophen (TYLENOL) suppository 650 mg, 650 mg, Rectal, Q4H PRN, Pahwani, Rinka R, MD .  albuterol (PROVENTIL) (2.5 MG/3ML) 0.083% nebulizer solution 2.5 mg, 2.5 mg, Inhalation, Q4H PRN, Pahwani, Rinka R, MD, 2.5 mg at 05/21/19 0755 .  apixaban (ELIQUIS) tablet 5 mg, 5 mg, Oral, BID, Charlynne Cousins, MD, 5 mg at 05/24/19 0858 .  aspirin EC tablet 81 mg, 81 mg, Oral, Daily, Angelia Mould, MD, 81 mg at 05/24/19 0900 .  atorvastatin (LIPITOR) tablet 40 mg, 40 mg, Oral, Daily, Rosalin Hawking, MD, 40 mg at 05/24/19 0859 .  captopril (CAPOTEN) tablet 50 mg, 50 mg, Oral, BID, Charlynne Cousins, MD, 50 mg at 05/24/19 0858 .  cholecalciferol (VITAMIN D3) tablet 2,000 Units, 2,000 Units, Oral, Daily, Pahwani, Rinka R, MD, 2,000 Units at 05/24/19 0859 .  clonazePAM (KLONOPIN) tablet 2 mg, 2 mg, Oral, QHS, Pahwani, Rinka R, MD, 2 mg at 05/23/19 2210 .  clopidogrel (PLAVIX) tablet 75 mg, 75 mg, Oral, Daily, Charlynne Cousins, MD, 75 mg at 05/24/19 0858 .  diltiazem (CARDIZEM CD) 24 hr capsule 300 mg, 300 mg, Oral, Daily, Charlynne Cousins, MD, 300 mg at 05/24/19 0858 .  escitalopram (LEXAPRO) tablet 20 mg, 20 mg, Oral, Daily, Pahwani, Rinka R, MD, 20 mg at 05/24/19 0858 .  levETIRAcetam (KEPPRA) tablet 750 mg, 750 mg, Oral, BID, Pahwani, Rinka R, MD, 750 mg at 05/24/19 0859 .  metoprolol tartrate (LOPRESSOR) injection 5 mg, 5  mg,  Intravenous, Q6H PRN, Charlynne Cousins, MD, 5 mg at 05/18/19 1514 .  pantoprazole (PROTONIX) EC tablet 40 mg, 40 mg, Oral, BID, Charlynne Cousins, MD, 40 mg at 05/24/19 0857 .  polyethylene glycol (MIRALAX / GLYCOLAX) packet 17 g, 17 g, Oral, Daily, Charlynne Cousins, MD, 17 g at 05/24/19 0857 .  sodium chloride (OCEAN) 0.65 % nasal spray 1 spray, 1 spray, Each Nare, PRN, Vann, Jessica U, DO, 1 spray at 05/22/19 0849 .  Tiotropium Bromide-Olodaterol 2.5-2.5 MCG/ACT AERS 2 puff, 2 puff, Inhalation, Daily, Charlynne Cousins, MD, 2 puff at 05/24/19 0757 .  triamterene-hydrochlorothiazide (MAXZIDE-25) 37.5-25 MG per tablet 1 tablet, 1 tablet, Oral, Daily, Charlynne Cousins, MD, 1 tablet at 05/24/19 0859 .  vitamin B-12 (CYANOCOBALAMIN) tablet 500 mcg, 500 mcg, Oral, Daily, Eulogio Bear U, DO, 500 mcg at 05/24/19 0857   Patients Current Diet:     Diet Order                      Diet - low sodium heart healthy           Diet Heart Room service appropriate? Yes; Fluid consistency: Thin  Diet effective now                   Precautions / Restrictions Precautions Precautions: Fall Precaution Comments: Left inattention  Restrictions Weight Bearing Restrictions: No    Has the patient had 2 or more falls or a fall with injury in the past year?No   Prior Activity Level Limited Community (1-2x/wk): Independent without AD   Prior Functional Level Prior Function Level of Independence: Independent with assistive device(s) Comments: previously walked without cane or walker.  Did break ankle from past sz.   Self Care: Did the patient need help bathing, dressing, using the toilet or eating?  Independent   Indoor Mobility: Did the patient need assistance with walking from room to room (with or without device)? Independent   Stairs: Did the patient need assistance with internal or external stairs (with or without device)? Independent   Functional Cognition: Did the patient  need help planning regular tasks such as shopping or remembering to take medications? Independent   Home Assistive Devices / Equipment Home Assistive Devices/Equipment: Eyeglasses Home Equipment: Shower seat   Prior Device Use: Indicate devices/aids used by the patient prior to current illness, exacerbation or injury? None of the above   Current Functional Level Cognition   Arousal/Alertness: Awake/alert Overall Cognitive Status: Within Functional Limits for tasks assessed Current Attention Level: Selective Orientation Level: Oriented to person, Oriented to place, Oriented to time, Oriented to situation Safety/Judgement: Decreased awareness of safety, Decreased awareness of deficits General Comments: Pt pleasant and willing to participate in therapy. Continued left inattention, however improving.  Attention: Focused, Sustained Focused Attention: Appears intact Sustained Attention: Impaired Sustained Attention Impairment: Verbal complex Memory: Appears intact Executive Function: Decision Making, Reasoning Reasoning: Appears intact Decision Making: Appears intact Safety/Judgment: Appears intact [employer] Scales of Cognitive Functioning: Purposeful/appropriate    Extremity Assessment (includes Sensation/Coordination)   Upper Extremity Assessment: Defer to OT evaluation(+edema left hand; ring mobile but getting tight; elevated) LUE Deficits / Details: PROM WFL. AROM:  shoulder 100*, elbow WFL, wrist WFL, hand limited grip.   LUE Sensation: decreased light touch, decreased proprioception LUE Coordination: decreased fine motor, decreased gross motor  Lower Extremity Assessment: LLE deficits/detail LLE Deficits / Details: AROM WFL; knee extension 3+, ankle DF 5 LLE Sensation: (accurate  light touch and proprioception) LLE Coordination: WNL     ADLs   Overall ADL's : Needs assistance/impaired Eating/Feeding: Sitting, Cueing for safety, Set up, Cueing for compensatory  techinques Eating/Feeding Details (indicate cue type and reason): Pt with some pocketing and does not feel L side of face as much as right so reports spilling and food on that side of her mouth that she is unaware of. Grooming: Wash/dry hands, Moderate assistance, Standing Grooming Details (indicate cue type and reason): While standing at the sink. Pt rested forearms on counter. Mod assist for balance. Pt able to incorporate LUE into task.  Upper Body Bathing: Moderate assistance, Sitting, Cueing for compensatory techniques, Cueing for safety Lower Body Bathing: Maximal assistance, Cueing for compensatory techniques, Cueing for safety, Sit to/from stand Upper Body Dressing : Maximal assistance, Sitting, Cueing for compensatory techniques, Cueing for safety Lower Body Dressing: Total assistance Lower Body Dressing Details (indicate cue type and reason): To don socks Toilet Transfer: Moderate assistance, Stand-pivot, BSC, +2 for physical assistance, +2 for safety/equipment(to strong side) Toileting- Clothing Manipulation and Hygiene: Maximal assistance, +2 for safety/equipment Toileting - Clothing Manipulation Details (indicate cue type and reason): Max assist for peri-care in standing following BM.  Functional mobility during ADLs: Moderate assistance, +2 for physical assistance, +2 for safety/equipment, Rolling walker General ADL Comments: Pt transferred to/from [facility] and ambulated to bedroom sink with RW and mod assist x 2. Pt required assist to maintain LUE on RW during mobility. Pt tolerated standing 1 x 4 min and 1 x 4.5 min during tasks.      Mobility   Overal bed mobility: Needs Assistance Bed Mobility: Supine to Sit Supine to sit: Min guard, HOB elevated Sit to supine: Min guard Sit to sidelying: Min assist General bed mobility comments: To ensure balance and safety. Cues to utilize LUE.      Transfers   Overall transfer level: Needs assistance Equipment used: Rolling walker (2  wheeled) Transfers: Sit to/from Stand, [employer] Transfers Sit to Stand: Mod assist, +2 safety/equipment Stand pivot transfers: Mod assist, +2 safety/equipment General transfer comment: Assist to power up into standing. Cues for hand placement, sequencing, and incorporation of LUE. Assist to maintain LUE on RW.      Ambulation / Gait / Stairs / Wheelchair Mobility   Ambulation/Gait Ambulation/Gait assistance: Mod assist, +2 safety/equipment, Min assist Gait Distance (Feet): (~10 ft total with seated break ) Assistive device: Rolling walker (2 wheeled) Gait Pattern/deviations: Step-through pattern, Decreased stance time - left, Decreased step length - right, Decreased step length - left, Trunk flexed, Wide base of support General Gait Details: assist to maintain balance, weight shift, manage RW, and maintain L hand grip on RW; cues to reposition L UE when needed, for sequencing, and upright posture      Posture / Balance Dynamic Sitting Balance Sitting balance - Comments: supervision in recliner without back support Balance Overall balance assessment: Needs assistance Sitting-balance support: No upper extremity supported, Feet supported Sitting balance-Leahy Scale: Fair Sitting balance - Comments: supervision in recliner without back support Postural control: Left lateral lean Standing balance support: Bilateral upper extremity supported, During functional activity Standing balance-Leahy Scale: Poor Standing balance comment: Pt heavily reliant on outside assist to stand.     Special needs/care consideration Trans carotid stenting planned for 06/07/2019 with Dr. Donzetta Matters Hgb A1c 6.0 Designated visitors are spouse, Jenny Reichmann and Daughter, [name] Home Oxygen at 2  To 3 liters [place]    Previous Home Environment  Living Arrangements: Spouse/significant other  Lives With: Spouse Available Help at Discharge: Family, Available 24 hours/day Type of Home: House Home Layout: One level Home Access:  Stairs to enter Entrance Stairs-Rails: Right Entrance Stairs-Number of Steps: 4 Bathroom Shower/Tub: Gaffer, Charity fundraiser: Handicapped height Bathroom Accessibility: Yes How Accessible: Accessible via walker [place]ma: No   Discharge Living Setting Plans for Discharge Living Setting: Patient's home, Lives with (comment)(spouse) Type of Home at Discharge: House Discharge Home Layout: One level Discharge Home Access: Stairs to enter Entrance Stairs-Rails: Right Entrance Stairs-Number of Steps: 4 Discharge Bathroom Shower/Tub: Walk-in shower Discharge Bathroom Toilet: Handicapped height Discharge Bathroom Accessibility: Yes How Accessible: Accessible via walker Does the patient have any problems obtaining your medications?: No   Social/Family/Support Systems Patient Roles: Spouse Contact Information: spouse, [name] Anticipated Ambulance person Information: see above Ability/Limitations of Caregiver: no limitations Caregiver Availability: 24/7 Discharge Plan Discussed with Primary Caregiver: Yes Is Caregiver In Agreement with Plan?: Yes Does Caregiver/Family have Issues with Lodging/Transportation while Pt is in Rehab?: No   Goals Patient/Family Goal for Rehab: supervision PT, OT, and SLP Expected length of stay: ELOS 10 to 14 days; TCAR lanned 5/25 Pt/Family Agrees to Admission and willing to participate: Yes Program Orientation Provided & Reviewed with Pt/Caregiver Including Roles  & Responsibilities: Yes   Decrease burden of Care through IP rehab admission: n/a   Possible need for SNF placement upon discharge:not anticipated   Patient Condition: This patient's medical and functional status has changed since the consult dated 05/18/2019 in which the Rehabilitation Physician determined and documented that the patient was potentially appropriate for intensive rehabilitative care in an inpatient rehabilitation facility. Issues have been addressed and  update has been discussed with Dr. Dagoberto Ligas and patient now appropriate for inpatient rehabilitation. Will admit to inpatient rehab today.    Preadmission Screen Completed By:  Cleatrice Burke, RN, 05/24/2019 10:23 AM ______________________________________________________________________   Discussed status with Dr. Dagoberto Ligas on 05/24/2019 at  1023 and received approval for admission today.   Admission Coordinator:  Cleatrice Burke, time 9437 Date 05/24/2019             Cosigned by: Courtney Heys, MD at 05/24/2019 10:28 AM  Revision History

## 2019-05-24 NOTE — Progress Notes (Signed)
Inpatient Rehabilitation Admissions Coordinator  Inpatient rehab bed is available to admit patient to today. I met with patient at bedside and spoke with her spouse by phone. They are in agreement. I have notified Dr. Olevia Bowens, [facility] team and acute team. I will make th arrangements to admit today.  Danne Baxter, RN, MSN Rehab Admissions Coordinator [phone] 05/24/2019 10:21 AM

## 2019-05-24 NOTE — Progress Notes (Signed)
Patient arrived to unit, no complications noted at this time. Husband at bedside. Audie Clear, LPN

## 2019-05-24 NOTE — H&P (Signed)
Physical Medicine and Rehabilitation Admission H&P    Chief Complaint  Patient presents with  . 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  Dr. Erlinda Hong felt the stroke was embolic secondary to right-ICA stenosis and ASA added and and recommended no DAPT due to concerns of thrombosed aneurysm.  CTA was negative for aneurysm and she was cleared to start Plavix if needed for carotid stenting.   Patient did develop A. fib with RVR requiring IV metoprolol and was transitioned to Cardizem for rate control.  Eliquis added due to new onset stroke.    Dr. Scot Dock was consulted for input on ICA stenosis and plans are for transcarotid stenting on 05/25 by Dr. Doristine Section stop Eliquis 48 hours preop on 05/23.  She had reports of left foot pain after twisting it during a transfer. Left ankle/foot films done due to pain and swelling and was negative for fracture--moderate plantar calcaneal spur, Achilles tendon entheosphyte and mid foot degenerative changes also noted.  Patient continues to have left-sided weakness with proprioceptive deficits affecting ADLs and mobility.  CIR recommended due to functional decline  Pt reports LBM this AM- wheezes a lot- admits doesn't like albuterol since makes her heart rate faster.  Asking about L ankle splint/boot since has twisted ankle a couple of times since admission, with transfers.     Review of Systems  Constitutional: Negative for chills and fever.  HENT: Negative for hearing loss and tinnitus.    Eyes: Positive for blurred vision (due for glasses--not seeing as well).  Respiratory: Positive for cough, shortness of breath (with minimal activity. Has to take multiple rest breaks. ) and wheezing.   Cardiovascular: Positive for palpitations (on and off PTA). Negative for chest pain.  Gastrointestinal: Positive for constipation. Negative for abdominal pain, heartburn and nausea.  Musculoskeletal: Positive for joint pain (bad left shoulder and now with right shoulder pain).  Neurological: Positive for dizziness and headaches (buzzing feeling). Negative for speech change.  All other systems reviewed and are negative.   Past Medical History:  Diagnosis Date  . BRCA2 positive 2017  . Diverticulosis   . Hearing loss   . HTN (hypertension)   . Memory loss   . Seizure disorder, complex partial, 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[place] [address]) seizure free for one year  . Seizures ([place])   . 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   . Vestibulitis of ear     Past Surgical History:  Procedure Laterality Date  . broken ankle Right   . CEREBRAL ANEURYSM REPAIR    . TONSILLECTOMY      Family History  Problem Relation Age of Onset  . Dementia Mother   . Stroke Mother   . Thyroid disease Mother   . Osteoporosis Mother   . Ovarian cancer Paternal Grandmother 36  . Ovarian cancer Paternal Aunt 20  . Breast cancer Daughter 79       breast ca x2, bilateral dx. 38, 46--estrogen  . Other Daughter        no genetic testing as of 07/2015; has had ovaries removed  .  Other Daughter        BRCA 2 Pos.  . Colon cancer Maternal Aunt        dx. unspecified age  . Emphysema Maternal Uncle   . High blood pressure Maternal Grandmother   . Dementia Maternal Grandfather   . Bleeding Disorder Maternal Grandfather   . Emphysema Maternal Aunt   . Emphysema Maternal Aunt     Social History: Married.  Independent PTA--but needs to take frequent rest breaks with activity due to DOE.  She reports that she quit  smoking about 13 years ago. She has a 41.00 pack-year smoking history. She has never used smokeless tobacco. She reports that she does not drink alcohol or use drugs.    Allergies  Allergen Reactions  . Prozac [Fluoxetine Hcl] Other (See Comments)    seizures  . Sulfa Antibiotics Nausea And Vomiting and Other (See Comments)    TINGLING IN LEGS ALSO  . Micardis [Telmisartan] Swelling and Other (See Comments)    Swelling, bruising Swelling, bruising  . Prednisone Other (See Comments)    mood changes Mood changes  . Lisinopril-Hydrochlorothiazide Other (See Comments)    unknown  . Augmentin [Amoxicillin-Pot Clavulanate] Rash  . Avelox [Moxifloxacin Hcl In Nacl] Rash    LEVAQUIN IS OK-SEE NOTE  . Norvasc [Amlodipine Besylate] Other (See Comments)    fatigue    Medications Prior to Admission  Medication Sig Dispense Refill  . albuterol (PROVENTIL HFA;VENTOLIN HFA) 108 (90 Base) MCG/ACT inhaler Inhale 2 puffs into the lungs every 4 (four) hours as needed for wheezing or shortness of breath. 1 Inhaler 5  . apixaban (ELIQUIS) 5 MG TABS tablet Take 1 tablet (5 mg total) by mouth 2 (two) times daily. 60 tablet   . aspirin EC 81 MG tablet Take 81 mg by mouth daily.    Marland Kitchen atorvastatin (LIPITOR) 20 MG tablet Take 2 tablets (40 mg total) by mouth daily. 30 tablet 11  . captopril (CAPOTEN) 50 MG tablet Take 50 mg by mouth 2 (two) times daily.    . Cholecalciferol (VITAMIN D3) 2000 units capsule 1 tablet by mouth daily    . clonazePAM (KLONOPIN) 2 MG tablet TAKE 1 TABLET AT BEDTIME (Patient taking differently: Take 1 mg by mouth daily. For seizure activity or insomnia dohmeier.) 90 tablet 0  . clopidogrel (PLAVIX) 75 MG tablet Take 1 tablet (75 mg total) by mouth daily.    Marland Kitchen diltiazem (CARDIZEM CD) 300 MG 24 hr capsule Take 1 capsule (300 mg total) by mouth daily.    . fluticasone (FLONASE) 50 MCG/ACT nasal spray Place 1 spray into the nose daily as needed for allergies. Spray twice daily as needed      . levETIRAcetam 750 MG TB3D Take 750 mg by mouth 2 (two) times daily. 180 tablet 0  . LEXAPRO 20 MG tablet Take 1 tablet (20 mg total) by mouth daily. Morning dosage 90 tablet 3  . Tiotropium Bromide-Olodaterol 2.5-2.5 MCG/ACT AERS Inhale 2 puffs into the lungs daily. 4 g 3  . triamterene-hydrochlorothiazide (MAXZIDE-25) 37.5-25 MG tablet Take 1 tablet by mouth daily.      Drug Regimen Review  Drug regimen was reviewed and remains appropriate with no significant issues identified  Home:     Functional History:    Functional Status:  Mobility:          ADL:    Cognition:       Last menstrual period 01/14/1995. Physical Exam  Nursing note and vitals reviewed. Constitutional:  She is oriented to person, place, and time. She appears well-developed and well-nourished. Nasal cannula in place.  Awake, appropriate, talkative, audible wheezing, husband at bedside, NAD  HENT:  Head: Normocephalic and atraumatic.  Nose: Nose normal.  Mouth/Throat: Oropharynx is clear and moist. No oropharyngeal exudate.  Equal smile- tongue midline Facial sensation decreased on L side  Eyes: Conjunctivae are normal.  EOMI B/L- nystagmus to R only with EOMs  Neck: No JVD present. No tracheal deviation present.  Cardiovascular:  Irregularly irregular- rate controlled  Respiratory: She has wheezes.  Tends to wheeze with increase WOB with conversation.  Intermittent congested cough.  Wheezing audibly, but more notable with auscultation Congested, wheezy cough when talking - a couple of time difficulty finishing her sentences. On Home O2 2L Asked nursing to get pt some albuterol  GI: Soft. Bowel sounds are normal. She exhibits no distension. There is no abdominal tenderness.  Musculoskeletal:     Cervical back: Normal range of motion and neck supple.     Comments: Moderate edema with ecchymosis left ankle--tender to touch and pain with attempts at ROM.  Also has ecchymosis alone left  lateral shin and healing abrasion on knee.  L ankle/dorsum of foot- mild greenish bruise and 2+ edema noted RUE- biceps, triceps, WE, grip and finger abd 5-/5 LUE- biceps 4-/5, triceps 4/5, WE 4/5, grip 4/5, finger abd 4-/5 RLE- HF, KE, KF, DF and PF 5-/5 LLE- 4-/5 in same muscles except NT on DF and PF due to ankle sprain- too painful  Neurological: She is alert and oriented to person, place, and time.  Speech clear but wheezing at times. Able to follow commands without difficulty. Verbose.   Decreased to light touch in LUE and LLE No hoffman's no clonus on L side  Skin: Skin is warm and dry. No erythema.  No skin breakdown on backside Skin bruises on arms esp- ecchymoses noted  Psychiatric: She has a normal mood and affect.    Results for orders placed or performed during the hospital encounter of 05/16/19 (from the past 48 hour(s))  CBC     Status: Abnormal   Collection Time: 05/23/19  4:17 AM  Result Value Ref Range   WBC 11.0 (H) 4.0 - 10.5 K/uL   RBC 3.88 3.87 - 5.11 MIL/uL   Hemoglobin 12.9 12.0 - 15.0 g/dL   HCT 38.3 36.0 - 46.0 %   MCV 98.7 80.0 - 100.0 fL   MCH 33.2 26.0 - 34.0 pg   MCHC 33.7 30.0 - 36.0 g/dL   RDW 12.6 11.5 - 15.5 %   Platelets 265 150 - 400 K/uL   nRBC 0.0 0.0 - 0.2 %    Comment: Performed at [facility], [place] [address], [place], [place] [address]  CBC     Status: Abnormal   Collection Time: 05/24/19  3:15 AM  Result Value Ref Range   WBC 13.2 (H) 4.0 - 10.5 K/uL   RBC 3.87 3.87 - 5.11 MIL/uL   Hemoglobin 12.6 12.0 - 15.0 g/dL   HCT 37.7 36.0 - 46.0 %   MCV 97.4 80.0 - 100.0 fL   MCH 32.6 26.0 - 34.0 pg   MCHC 33.4 30.0 - 36.0 g/dL   RDW 12.2 11.5 - 15.5 %   Platelets 294 150 - 400 K/uL   nRBC 0.0 0.0 - 0.2 %    Comment: Performed at [facility], [place] [address], [place], [place] [address]   DG Ankle 2 Views  Left  Result Date: 05/22/2019 CLINICAL DATA:  Left ankle pain and swelling EXAM: LEFT ANKLE - 2 VIEW COMPARISON:  None.  FINDINGS: There is soft tissue swelling about the ankle. There is no acute displaced fracture. No dislocation. There is a moderate-sized plantar calcaneal spur. There is an Achilles tendon enthesophyte. Degenerative changes are noted of the midfoot. IMPRESSION: Soft tissue swelling without acute fracture or dislocation. Electronically Signed   By: Constance Holster M.D.   On: 05/22/2019 16:56   DG Foot 2 Views Left  Result Date: 05/22/2019 CLINICAL DATA:  Pain and swelling. EXAM: LEFT FOOT - 2 VIEW COMPARISON:  None. FINDINGS: There is nonspecific soft tissue swelling about the foot. There are degenerative changes of the first metatarsophalangeal joint. There is lucency at the head of the first metatarsal which may be secondary to underlying degenerative changes. There is a moderate-sized plantar calcaneal spur. IMPRESSION: 1. No acute displaced fracture or dislocation. 2. There is nonspecific soft tissue swelling about the foot. Electronically Signed   By: Constance Holster M.D.   On: 05/22/2019 16:57       Medical Problem List and Plan: 1.  Impaired function, mobility and ADLs with L hemiparesis secondary to R MCA stroke  -patient may  Shower- asking for one ASAP  -ELOS/Goals: 10-14 days- mod I to supervision 2.  Antithrombotics: -DVT/anticoagulation:  Pharmaceutical: Other (comment)--Eliquis  -antiplatelet therapy: on DAPT 3. Pain Management: Continue Klonopin for "pain' and seizures  4. Mood: LCSW to follow for evaluation and support.   -antipsychotic agents: N/A 5. Neuropsych: This patient is capable of making decisions on her own behalf. 6. Skin/Wound Care: Routine pressure measures.  7. Fluids/Electrolytes/Nutrition: Monitor I/O. Check lytes in am.  8. HTN: Monitor BP tid--continue Capoten, Cardizem and Maxzide.  9. Seizure: Has been controlled on Klonopin and Keppra bid.   10. Leucocytosis: Monitor for signs of infection.  11. New onset A fib: Monitor HR tid--on Cardizem and  Eliquis  12. Chronic constipation: Managed with Miralax  13. H/o Depression: On Lexapro and Klonopin.  14. 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  Continue Stiolto. Will add Claritin and nose spray to help with nasal congestion.  Also added Mucinex DM to help with congested cough.  Check chest x-ray due to reports of worsening. Needs xopenex (since has tachycardia with albuterol) as needed for SOB/wheezing.  15.  Left ankle sprain: Will order ASO.  Ice tid as well as Voltaren gel to Achilles tendon to help with pain.    Bary Leriche- PA-C 05/24/2019   I have personally performed a face to face diagnostic evaluation of this patient and formulated the key components of the plan.  Additionally, I have personally reviewed laboratory data, imaging studies, as well as relevant notes and concur with the physician assistant's documentation above.   The patient's status has not changed from the original H&P.  Any changes in documentation from the acute care chart have been noted above.    Courtney Heys, MD 05/24/2019

## 2019-05-24 NOTE — TOC Transition Note (Signed)
Transition of Care [facility]) - CM/SW Discharge Note   Patient Details  Name: Beth [name] MRN: [id number] Date of Birth: June 17, 1940  [facility] CM/SW Contact:  Pollie Friar, RN Phone Number: 05/24/2019, 1:41 PM   Clinical Narrative:    Pt is discharging to CIR today. CM signing off.    Final next level of care: IP Rehab Facility Barriers to Discharge: No Barriers Identified   Patient Goals and CMS Choice     Choice offered to / list presented to : Patient, Spouse  Discharge Placement                       Discharge Plan and Services   Discharge Planning Services: CM Consult Post Acute Care Choice: IP Rehab                               Social Determinants of Health (SDOH) Interventions     Readmission Risk Interventions No flowsheet data found.

## 2019-05-24 NOTE — Progress Notes (Signed)
Kirsteins, Luanna Salk, MD  Physician  Physical Medicine and Rehabilitation  Consult Note     Signed  Date of Service:  05/18/2019  8:52 AM      Related encounter: ED to Hosp-Admission (Current) from 05/16/2019 in [place] [facility]      Signed      Expand AllCollapse All   Show:Clear all '[x]' Manual'[x]' Template'[]' Copied  Added by: '[x]' Kirsteins, Luanna Salk, MD'[x]' Love, Ivan Anchors, PA-C  '[]' Hover for details          Physical Medicine and Rehabilitation Consult     Reason for Consult: stroke with functional deficits Referring Physician: Dr. Eliseo Squires     HPI: Beth [name] is a 79 y.o. 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 CT head negative. 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   MRI brain done revealing small acute R-MCA infarct involving frontoparietal region and chronic large right temporal lobe infarct. Dr. Erlinda Hong felt that stroke was embolic due to secondary to R-ICA stenosis and no DAPT as question that round hyperdensity at temporal lobe could be thrombosed aneurysm. On ASA for secondary stroke prevention.    [facility] consulted for input on ICA stenosis and Dr. Scot Dock reviewing case as patient potential candidate for stenting. She did develop A fib with RVR last night requiring IV metoprolol. Therapy evaluations completed revealing functional deficits due to left hemiparesis with sensory deficits and left inattention. CIR recommended due to functional decline.    Patient does not note any visual issues.  She does have some numbness around her mouth and left nostril.  Her husband has noted some spillage of food and drink from the left side of her mouth Review of Systems  Constitutional: Negative for chills and fever.  HENT: Negative for hearing  loss and tinnitus.   Eyes: Negative for blurred vision and double vision.  Respiratory: Negative for cough and shortness of breath.   Cardiovascular: Positive for palpitations.  Gastrointestinal: Positive for constipation (due to rectocele). Negative for heartburn and nausea.  Genitourinary: Negative for dysuria and urgency.  Musculoskeletal: Positive for back pain, joint pain (right hip and left ankle) and myalgias.  Skin: Negative for itching and rash.  Neurological: Positive for sensory change, speech change and focal weakness. Negative for dizziness and headaches.          Past Medical History:  Diagnosis Date  . BRCA2 positive 2017  . Diverticulosis    . Hearing loss    . HTN (hypertension)    . Memory loss    . Seizure disorder, complex partial, 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[place]) seizure free for one year  . Seizures ([place])    . 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   . Vestibulitis of ear             Past Surgical History:  Procedure Laterality Date  . broken ankle Right    . CEREBRAL ANEURYSM REPAIR      . TONSILLECTOMY               Family History  Problem Relation Age of Onset  . Dementia Mother    . Stroke Mother    . Thyroid disease Mother    . Osteoporosis Mother    . Ovarian cancer Paternal Grandmother 65  . Ovarian cancer Paternal Aunt 26  .  Breast cancer Daughter 38        breast ca x2, bilateral dx. 38, 46--estrogen  . Other Daughter          no genetic testing as of 07/2015; has had ovaries removed  . Other Daughter          BRCA 2 Pos.  . Colon cancer Maternal Aunt          dx. unspecified age  . Emphysema Maternal Uncle    . High blood pressure Maternal Grandmother    . Dementia Maternal Grandfather    . Bleeding Disorder Maternal Grandfather    . Emphysema Maternal Aunt    . Emphysema Maternal Aunt        Social History:  Married. 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 She reports that she quit smoking about  13 years ago. She has a 41.00 pack-year smoking history. She has never used smokeless tobacco. She reports that she does not drink alcohol or use drugs.          Allergies  Allergen Reactions  . Prozac [Fluoxetine Hcl] Other (See Comments)      seizures  . Sulfa Antibiotics Nausea And Vomiting and Other (See Comments)      TINGLING IN LEGS ALSO  . Micardis [Telmisartan] Swelling and Other (See Comments)      Swelling, bruising Swelling, bruising  . Prednisone Other (See Comments)      mood changes Mood changes  . Lisinopril-Hydrochlorothiazide Other (See Comments)      unknown  . Augmentin [Amoxicillin-Pot Clavulanate] Rash  . Avelox [Moxifloxacin Hcl In Nacl] Rash      LEVAQUIN IS OK-SEE NOTE  . Norvasc [Amlodipine Besylate] Other (See Comments)      fatigue            Medications Prior to Admission  Medication Sig Dispense Refill  . albuterol (PROVENTIL HFA;VENTOLIN HFA) 108 (90 Base) MCG/ACT inhaler Inhale 2 puffs into the lungs every 4 (four) hours as needed for wheezing or shortness of breath. 1 Inhaler 5  . aspirin EC 81 MG tablet Take 81 mg by mouth daily.      Marland Kitchen atenolol (TENORMIN) 50 MG tablet Take 25 mg by mouth 2 (two) times daily.       . captopril (CAPOTEN) 50 MG tablet Take 50 mg by mouth 2 (two) times daily.      . Cholecalciferol (VITAMIN D3) 2000 units capsule 1 tablet by mouth daily      . clonazePAM (KLONOPIN) 2 MG tablet TAKE 1 TABLET AT BEDTIME (Patient taking differently: Take 1 mg by mouth daily. For seizure activity or insomnia dohmeier.) 90 tablet 0  . fluticasone (FLONASE) 50 MCG/ACT nasal spray Place 1 spray into the nose daily as needed for allergies. Spray twice daily as needed       . levETIRAcetam 750 MG TB3D Take 750 mg by mouth 2 (two) times daily. 180 tablet 0  . LEXAPRO 20 MG tablet Take 1 tablet (20 mg total) by mouth daily. Morning dosage 90 tablet 3  . simvastatin (ZOCOR) 10 MG tablet Take 10 mg by mouth at bedtime.      . Tiotropium  Bromide-Olodaterol 2.5-2.5 MCG/ACT AERS Inhale 2 puffs into the lungs daily. 4 g 3  . triamterene-hydrochlorothiazide (MAXZIDE-25) 37.5-25 MG tablet Take 1 tablet by mouth daily.          Home: Home Living Family/patient expects to be discharged to:: Private residence  Living Arrangements: Spouse/significant other Available Help at Discharge: Family, Available 24 hours/day Type of Home: House Home Access: Stairs to enter [employer] of Steps: 4 Entrance Stairs-Rails: Right Home Layout: One level Bathroom Shower/Tub: Gaffer, Door [employer] Toilet: Handicapped height Home Equipment: Careers adviser History: Prior Function Level of Independence: Independent with assistive device(s) Comments: previously walked without cane or walker.  Did break ankle from past sz. Functional Status:  Mobility: Bed Mobility Overal bed mobility: Needs Assistance Bed Mobility: Supine to Sit Supine to sit: Mod assist, HOB elevated General bed mobility comments: cues to use LUE as gross assist and cues to remember where her LUE is when sitting in bed or on EOB. Transfers Overall transfer level: Needs assistance Transfers: Sit to/from Stand, Stand Pivot Transfers Sit to Stand: Mod assist Stand pivot transfers: Mod assist General transfer comment: Pt required cues to stand up straight when transferring. Pt wants to wear her sandels that do not have a back on them and they appear to slip on floor.  Instructed that yellow socks are best option for now.   ADL: ADL Overall ADL's : Needs assistance/impaired Eating/Feeding: Sitting, Cueing for safety, Set up, Cueing for compensatory techinques Eating/Feeding Details (indicate cue type and reason): Pt with some pocketing and does not feel L side of face as much as right so reports spilling and food on that side of her mouth that she is unaware of. Grooming: Oral care, Wash/dry hands, Wash/dry face, Minimal assistance, Sitting Grooming  Details (indicate cue type and reason): Pt with significant L neglect.  Cued to use LUE as a gross assist during grooming tasks.  Pt with no feeling in the LUE. Upper Body Bathing: Moderate assistance, Sitting, Cueing for compensatory techniques, Cueing for safety Lower Body Bathing: Maximal assistance, Cueing for compensatory techniques, Cueing for safety, Sit to/from stand Upper Body Dressing : Maximal assistance, Sitting, Cueing for compensatory techniques, Cueing for safety Lower Body Dressing: Maximal assistance, Sit to/from stand, Cueing for compensatory techniques, Cueing for safety Toilet Transfer: Moderate assistance, Stand-pivot, BSC(to her strong side) Toileting- Clothing Manipulation and Hygiene: Moderate assistance, Sit to/from stand, Cueing for safety, Cueing for compensatory techniques Functional mobility during ADLs: Moderate assistance, Cueing for safety General ADL Comments: Pt very limited wtih adls due to L side weakness, L neglect, impulsivity and poor balance and ability to stand.   Cognition: Cognition Overall Cognitive Status: Impaired/Different from baseline Orientation Level: Oriented X4 Cognition Arousal/Alertness: Awake/alert Behavior During Therapy: Impulsive Overall Cognitive Status: Impaired/Different from baseline Area of Impairment: Attention, Safety/judgement, Awareness Current Attention Level: Selective Safety/Judgement: Decreased awareness of safety, Decreased awareness of deficits Awareness: Emergent General Comments: Pt overall does well but has some mild impulsivity, is hyperverbal and does not know the depth of her deficits.     Blood pressure (!) 158/85, pulse 86, temperature 98 F (36.7 C), temperature source Oral, resp. rate 20, height '5\' 4"'  (1.626 m), weight 108.6 kg, last menstrual period 01/14/1995, SpO2 99 %. Physical Exam  Nursing note and vitals reviewed. Constitutional: She is oriented to person, place, and time. She appears  well-developed and well-nourished. Nasal cannula in place.  Eyes: Pupils are equal, round, and reactive to light. Conjunctivae and EOM are normal.  Visual fields are intact confrontation testing  Cardiovascular: An irregularly irregular rhythm present. Tachycardia present.  No murmur heard. Respiratory: Effort normal and breath sounds normal. No respiratory distress.  GI: Soft. Bowel sounds are normal. She exhibits distension. There is no abdominal tenderness.  Musculoskeletal:  Comments: 2+ edema left ankle with ecchymosis laterally and tenderness with ROM.   Neurological: She is alert and oriented to person, place, and time.  Left facial weakness with minimal dysarthria. Left sided weakness with sensory deficits. Able to follow commands without difficulty.  Gait not tested Visual fields are intact confrontation testing Sensation reduced in the left upper extremity, absent to light touch cannot feel pinch but cannot localize Left lower extremity sensation intact to light touch Motor strength is 3 - in the left deltoid bicep tricep finger flexors and extensors 4 - left hip flexor knee extensor ankle dorsiflexor 5/5 right deltoid bicep tricep grip hip flexor knee extensor ankle dorsiflexor  Skin: Skin is warm and dry.  Psychiatric: She has a normal mood and affect. Her behavior is normal. Judgment and thought content normal.      Lab Results Last 24 Hours       Results for orders placed or performed during the hospital encounter of 05/16/19 (from the past 24 hour(s))  Urine rapid drug screen (hosp performed)     Status: None    Collection Time: 05/16/19  3:56 PM  Result Value Ref Range    Opiates NONE DETECTED NONE DETECTED    Cocaine NONE DETECTED NONE DETECTED    Benzodiazepines NONE DETECTED NONE DETECTED    Amphetamines NONE DETECTED NONE DETECTED    Tetrahydrocannabinol NONE DETECTED NONE DETECTED    Barbiturates NONE DETECTED NONE DETECTED  Urinalysis, Routine w reflex  microscopic     Status: Abnormal    Collection Time: 05/16/19  3:56 PM  Result Value Ref Range    Color, Urine YELLOW YELLOW    APPearance CLEAR CLEAR    Specific Gravity, Urine >1.046 (H) 1.005 - 1.030    pH 6.0 5.0 - 8.0    Glucose, UA NEGATIVE NEGATIVE mg/dL    Hgb urine dipstick NEGATIVE NEGATIVE    Bilirubin Urine NEGATIVE NEGATIVE    Ketones, ur 5 (A) NEGATIVE mg/dL    Protein, ur NEGATIVE NEGATIVE mg/dL    Nitrite NEGATIVE NEGATIVE    Leukocytes,Ua TRACE (A) NEGATIVE    RBC / HPF 0-5 0 - 5 RBC/hpf    WBC, UA 0-5 0 - 5 WBC/hpf    Bacteria, UA NONE SEEN NONE SEEN    Squamous Epithelial / LPF 0-5 0 - 5  Vitamin B12     Status: None    Collection Time: 05/16/19  6:39 PM  Result Value Ref Range    Vitamin B-12 280 180 - 914 pg/mL  Folate RBC     Status: None    Collection Time: 05/16/19  6:39 PM  Result Value Ref Range    Folate, Hemolysate 468.0 Not Estab. ng/mL    Hematocrit 40.3 34.0 - 46.6 %    Folate, RBC 1,161 >498 ng/mL  TSH     Status: None    Collection Time: 05/16/19  6:39 PM  Result Value Ref Range    TSH 1.737 0.350 - 4.500 uIU/mL  Hemoglobin A1c     Status: Abnormal    Collection Time: 05/17/19  3:46 AM  Result Value Ref Range    Hgb A1c MFr Bld 6.0 (H) 4.8 - 5.6 %    Mean Plasma Glucose 125.5 mg/dL  Lipid panel     Status: Abnormal    Collection Time: 05/17/19  3:46 AM  Result Value Ref Range    Cholesterol 151 0 - 200 mg/dL    Triglycerides 172 (H) <150 mg/dL  HDL 39 (L) >40 mg/dL    Total CHOL/HDL Ratio 3.9 RATIO    VLDL 34 0 - 40 mg/dL    LDL Cholesterol 78 0 - 99 mg/dL  CBC with Differential/Platelet     Status: Abnormal    Collection Time: 05/17/19  3:46 AM  Result Value Ref Range    WBC 12.5 (H) 4.0 - 10.5 K/uL    RBC 3.89 3.87 - 5.11 MIL/uL    Hemoglobin 12.9 12.0 - 15.0 g/dL    HCT 37.7 36.0 - 46.0 %    MCV 96.9 80.0 - 100.0 fL    MCH 33.2 26.0 - 34.0 pg    MCHC 34.2 30.0 - 36.0 g/dL    RDW 12.2 11.5 - 15.5 %    Platelets 247 150 -  400 K/uL    nRBC 0.0 0.0 - 0.2 %    Neutrophils Relative % 72 %    Neutro Abs 9.0 (H) 1.7 - 7.7 K/uL    Lymphocytes Relative 17 %    Lymphs Abs 2.2 0.7 - 4.0 K/uL    Monocytes Relative 9 %    Monocytes Absolute 1.2 (H) 0.1 - 1.0 K/uL    Eosinophils Relative 1 %    Eosinophils Absolute 0.1 0.0 - 0.5 K/uL    Basophils Relative 0 %    Basophils Absolute 0.0 0.0 - 0.1 K/uL    Immature Granulocytes 1 %    Abs Immature Granulocytes 0.06 0.00 - 0.07 K/uL  Basic metabolic panel     Status: Abnormal    Collection Time: 05/17/19  3:46 AM  Result Value Ref Range    Sodium 133 (L) 135 - 145 mmol/L    Potassium 3.7 3.5 - 5.1 mmol/L    Chloride 100 98 - 111 mmol/L    CO2 26 22 - 32 mmol/L    Glucose, Bld 116 (H) 70 - 99 mg/dL    BUN 8 8 - 23 mg/dL    Creatinine, Ser 0.74 0.44 - 1.00 mg/dL    Calcium 8.8 (L) 8.9 - 10.3 mg/dL    GFR calc non Af Amer >60 >60 mL/min    GFR calc Af Amer >60 >60 mL/min    Anion gap 7 5 - 15       Imaging Results (Last 48 hours)  CT Code Stroke CTA Head W/WO contrast   Result Date: 05/16/2019 CLINICAL DATA:  Code stroke follow-up, left-sided weakness EXAM: CT ANGIOGRAPHY HEAD AND NECK TECHNIQUE: Multidetector CT imaging of the head and neck was performed using the standard protocol during bolus administration of intravenous contrast. Multiplanar CT image reconstructions and MIPs were obtained to evaluate the vascular anatomy. Carotid stenosis measurements (when applicable) are obtained utilizing NASCET criteria, using the distal internal carotid diameter as the denominator. CONTRAST:  80m OMNIPAQUE IOHEXOL 350 MG/ML SOLN COMPARISON:  None. FINDINGS: CTA NECK FINDINGS Aortic arch: Calcified and noncalcified plaque along the arch and patent great vessel origins. Right carotid system: Common carotid is patent with atherosclerotic wall thickening and partially retropharyngeal course. There is primarily calcified plaque at the bifurcation extending along the proximal  internal carotid causing approximately 55% stenosis. Remainder of the cervical ICA is patent. There is high-grade stenosis at the external carotid origin. Left carotid system: Common carotid is patent with atherosclerotic wall thickening. Internal and external carotid arteries are patent. There is mild calcified plaque along the proximal ICA without measurable stenosis. Vertebral arteries: Patent.  No measurable stenosis. Skeleton: Degenerative changes of the cervical spine. Other  neck: No mass or adenopathy. Upper chest: Emphysema. Review of the MIP images confirms the above findings CTA HEAD FINDINGS Anterior circulation: Intracranial internal carotid arteries patent with minimal calcified plaque. Anterior and middle cerebral arteries are patent. Posterior circulation: Intracranial vertebral arteries basilar artery, and posterior cerebral arteries are patent. Venous sinuses: As permitted by contrast timing, patent. Review of the MIP images confirms the above findings IMPRESSION: No large vessel occlusion. Plaque at the right ICA origin causes approximately 55% stenosis. There is no measurable stenosis at the left ICA origin. Electronically Signed   By: Macy Mis M.D.   On: 05/16/2019 12:26    CT Code Stroke CTA Neck W/WO contrast   Result Date: 05/16/2019 CLINICAL DATA:  Code stroke follow-up, left-sided weakness EXAM: CT ANGIOGRAPHY HEAD AND NECK TECHNIQUE: Multidetector CT imaging of the head and neck was performed using the standard protocol during bolus administration of intravenous contrast. Multiplanar CT image reconstructions and MIPs were obtained to evaluate the vascular anatomy. Carotid stenosis measurements (when applicable) are obtained utilizing NASCET criteria, using the distal internal carotid diameter as the denominator. CONTRAST:  75m OMNIPAQUE IOHEXOL 350 MG/ML SOLN COMPARISON:  None. FINDINGS: CTA NECK FINDINGS Aortic arch: Calcified and noncalcified plaque along the arch and patent  great vessel origins. Right carotid system: Common carotid is patent with atherosclerotic wall thickening and partially retropharyngeal course. There is primarily calcified plaque at the bifurcation extending along the proximal internal carotid causing approximately 55% stenosis. Remainder of the cervical ICA is patent. There is high-grade stenosis at the external carotid origin. Left carotid system: Common carotid is patent with atherosclerotic wall thickening. Internal and external carotid arteries are patent. There is mild calcified plaque along the proximal ICA without measurable stenosis. Vertebral arteries: Patent.  No measurable stenosis. Skeleton: Degenerative changes of the cervical spine. Other neck: No mass or adenopathy. Upper chest: Emphysema. Review of the MIP images confirms the above findings CTA HEAD FINDINGS Anterior circulation: Intracranial internal carotid arteries patent with minimal calcified plaque. Anterior and middle cerebral arteries are patent. Posterior circulation: Intracranial vertebral arteries basilar artery, and posterior cerebral arteries are patent. Venous sinuses: As permitted by contrast timing, patent. Review of the MIP images confirms the above findings IMPRESSION: No large vessel occlusion. Plaque at the right ICA origin causes approximately 55% stenosis. There is no measurable stenosis at the left ICA origin. Electronically Signed   By: PMacy MisM.D.   On: 05/16/2019 12:26    MR BRAIN WO CONTRAST   Result Date: 05/16/2019 CLINICAL DATA:  Suspected stroke. Left-sided weakness. EXAM: MRI HEAD WITHOUT CONTRAST TECHNIQUE: Multiplanar, multiecho pulse sequences of the brain and surrounding structures were obtained without intravenous contrast. COMPARISON:  Head CT 05/16/2019 and MRI 04/21/2017 FINDINGS: Brain: There is a small acute right MCA territory infarct primarily involving cortex in the frontoparietal region. The posterior aspect of the right insula is also  involved. A large chronic infarct is again noted laterally in the right temporal lobe with a small amount of associated chronic blood products. Patchy T2 hyperintensities in the cerebral white matter bilaterally have mildly progressed from the prior MRI and are nonspecific but compatible with mildly age advanced chronic small vessel ischemic disease. There is mild global cerebral atrophy. No mass, midline shift, or extra-axial fluid collection is identified. Vascular: Major intracranial vascular flow voids are preserved. Skull and upper cervical spine: No suspicious marrow lesion. Moderate disc and facet degeneration in the cervical spine. Sinuses/Orbits: Bilateral cataract extraction. Unchanged left maxillary sinus mucous  retention cyst. Clear mastoid air cells. Other: None. IMPRESSION: 1. Small acute right MCA infarct. 2. Chronic right temporal lobe infarct. 3. Mild chronic small vessel ischemic disease. Electronically Signed   By: Logan Bores M.D.   On: 05/16/2019 14:42    CT HEAD CODE STROKE WO CONTRAST   Result Date: 05/16/2019 CLINICAL DATA:  Code stroke.  Left-sided weakness EXAM: CT HEAD WITHOUT CONTRAST TECHNIQUE: Contiguous axial images were obtained from the base of the skull through the vertex without intravenous contrast. COMPARISON:  None. FINDINGS: Brain: There is no acute intracranial hemorrhage or mass effect. No definite acute appearing loss of gray-white differentiation. Questionable loss of gray-white differentiation along the right postcentral gyrus is probably artifactual. Encephalomalacia is again identified within the right temporal lobe extending posterior superiorly to the atrium of the right lateral ventricle. Additional patchy hypoattenuation in the supratentorial white matter is nonspecific but probably reflects stable chronic microvascular ischemic changes. Ventricles are stable in size. Vascular: There is no hyperdense vessel. Intracranial atherosclerotic calcification is present  at the skull base. Skull: Calvarium is unremarkable. Sinuses/Orbits: Left maxillary sinus retention cyst or polyp. No acute orbital finding. Other: Mastoid air cells are clear. ASPECTS ([place] Stroke Program Early CT Score) - Ganglionic level infarction (caudate, lentiform nuclei, internal capsule, insula, M1-M3 cortex): 7 - Supraganglionic infarction (M4-M6 cortex): 3 Total score (0-10 with 10 being normal): 10 IMPRESSION: No acute intracranial hemorrhage or evidence of acute infarction. ASPECT score is 10. These results were called by telephone at the time of interpretation on 05/16/2019 to provider Dr. Cheral Marker, who verbally acknowledged these results. Electronically Signed   By: Macy Mis M.D.   On: 05/16/2019 12:09         Assessment/Plan: Diagnosis: Right MCA infarct question cardioembolic versus ICA stenosis 1. Does the need for close, 24 hr/day medical supervision in concert with the patient's rehab needs make it unreasonable for this patient to be served in a less intensive setting? Yes 2. Co-Morbidities requiring supervision/potential complications: New onset atrial fibrillation with rapid ventricular response, morbid obesity, hypertension, right carotid stenosis 3. Due to bladder management, bowel management, safety, skin/wound care, disease management, medication administration, pain management and patient education, does the patient require 24 hr/day rehab nursing? Yes 4. Does the patient require coordinated care of a physician, rehab nurse, therapy disciplines of PT, OT, speech to address physical and functional deficits in the context of the above medical diagnosis(es)? Yes Addressing deficits in the following areas: balance, endurance, locomotion, strength, transferring, bowel/bladder control, bathing, dressing, feeding, toileting, cognition and psychosocial support 5. Can the patient actively participate in an intensive therapy program of at least 3 hrs of therapy per day at least 5  days per week? Yes 6. The potential for patient to make measurable gains while on inpatient rehab is excellent 7. Anticipated functional outcomes upon discharge from inpatient rehab are supervision  with PT, supervision with OT, supervision with SLP. 8. Estimated rehab length of stay to reach the above functional goals is: 16-20d 9. Anticipated discharge destination: Home 10. Overall Rehab/Functional Prognosis: excellent   RECOMMENDATIONS: This patient's condition is appropriate for continued rehabilitative care in the following setting: CIR Patient has agreed to participate in recommended program. Yes Note that insurance prior authorization may be required for reimbursement for recommended care.   Comment: Not ready today, new onset atrial fibrillation with rapid ventricular response will need to establish plan for this.  Also need to establish whether carotid endarterectomy will be performed urgently or whether this  may wait until after inpatient rehabilitation   Bary Leriche, PA-C 05/17/2019    "I have personally performed a face to face diagnostic evaluation of this patient.  Additionally, I have reviewed and concur with the physician assistant's documentation above."   Charlett Blake M.D. [facility] FAAPM&R (Neuromuscular Med) Diplomate Am Board of Electrodiagnostic Med Fellow Am Board of Interventional Pain            Revision History                     Routing History

## 2019-05-25 ENCOUNTER — Inpatient Hospital Stay (HOSPITAL_COMMUNITY): Payer: Medicare Other

## 2019-05-25 ENCOUNTER — Inpatient Hospital Stay (HOSPITAL_COMMUNITY): Payer: Medicare Other | Admitting: Speech Pathology

## 2019-05-25 ENCOUNTER — Inpatient Hospital Stay (HOSPITAL_COMMUNITY): Payer: Medicare Other | Admitting: Occupational Therapy

## 2019-05-25 LAB — CBC WITH DIFFERENTIAL/PLATELET
Abs Immature Granulocytes: 0.07 10*3/uL (ref 0.00–0.07)
Basophils Absolute: 0.1 10*3/uL (ref 0.0–0.1)
Basophils Relative: 1 %
Eosinophils Absolute: 0.3 10*3/uL (ref 0.0–0.5)
Eosinophils Relative: 2 %
HCT: 35.7 % — ABNORMAL LOW (ref 36.0–46.0)
Hemoglobin: 11.9 g/dL — ABNORMAL LOW (ref 12.0–15.0)
Immature Granulocytes: 1 %
Lymphocytes Relative: 15 %
Lymphs Abs: 1.6 10*3/uL (ref 0.7–4.0)
MCH: 32.4 pg (ref 26.0–34.0)
MCHC: 33.3 g/dL (ref 30.0–36.0)
MCV: 97.3 fL (ref 80.0–100.0)
Monocytes Absolute: 1 10*3/uL (ref 0.1–1.0)
Monocytes Relative: 9 %
Neutro Abs: 8 10*3/uL — ABNORMAL HIGH (ref 1.7–7.7)
Neutrophils Relative %: 72 %
Platelets: 272 10*3/uL (ref 150–400)
RBC: 3.67 MIL/uL — ABNORMAL LOW (ref 3.87–5.11)
RDW: 12.1 % (ref 11.5–15.5)
WBC: 11 10*3/uL — ABNORMAL HIGH (ref 4.0–10.5)
nRBC: 0 % (ref 0.0–0.2)

## 2019-05-25 LAB — COMPREHENSIVE METABOLIC PANEL
ALT: 20 U/L (ref 0–44)
AST: 22 U/L (ref 15–41)
Albumin: 3 g/dL — ABNORMAL LOW (ref 3.5–5.0)
Alkaline Phosphatase: 43 U/L (ref 38–126)
Anion gap: 11 (ref 5–15)
BUN: 9 mg/dL (ref 8–23)
CO2: 30 mmol/L (ref 22–32)
Calcium: 8.9 mg/dL (ref 8.9–10.3)
Chloride: 93 mmol/L — ABNORMAL LOW (ref 98–111)
Creatinine, Ser: 0.72 mg/dL (ref 0.44–1.00)
GFR calc Af Amer: >60 mL/min (ref >60)
GFR calc non Af Amer: >60 mL/min (ref >60)
Glucose, Bld: 139 mg/dL — ABNORMAL HIGH (ref 70–99)
Potassium: 3.5 mmol/L (ref 3.5–5.1)
Sodium: 134 mmol/L — ABNORMAL LOW (ref 135–145)
Total Bilirubin: 0.8 mg/dL (ref 0.3–1.2)
Total Protein: 6.5 g/dL (ref 6.5–8.1)

## 2019-05-25 MED ORDER — LORATADINE 10 MG PO TABS
10.0000 mg | ORAL_TABLET | Freq: Every day | ORAL | Status: DC
  Administered 2019-05-25 – 2019-06-06 (×13): 10 mg via ORAL
  Filled 2019-05-25 (×13): qty 1

## 2019-05-25 MED ORDER — BLOOD PRESSURE CONTROL BOOK
Freq: Once | Status: AC
  Filled 2019-05-25: qty 1

## 2019-05-25 MED ORDER — NON FORMULARY: 1.0000 | Freq: Two times a day (BID) | Status: DC

## 2019-05-25 MED ORDER — TIOTROPIUM BROMIDE-OLODATEROL 2.5-2.5 MCG/ACT IN AERS
2.0000 | INHALATION_SPRAY | Freq: Every day | RESPIRATORY_TRACT | Status: DC
  Administered 2019-05-25: 13:00:00 2 via RESPIRATORY_TRACT

## 2019-05-25 NOTE — Progress Notes (Signed)
Orthopedic Tech Progress Note Patient Details:  Beth [name] 1940-01-26 [id number]  Ortho Devices Type of Ortho Device: Prafo boot/shoe Ortho Device/Splint Location: LLE Ortho Device/Splint Interventions: Ordered, Application   Post Interventions Patient Tolerated: Well Instructions Provided: Adjustment of device, Care of device   [name] 05/25/2019, 12:51 PM

## 2019-05-25 NOTE — Progress Notes (Signed)
Team Conference Report to Patient/Family  Team Conference discussion was reviewed with the patient and caregiver, including goals, any changes in plan of care and target discharge date.  Patient and caregiver express understanding and are in agreement.  The patient has a target discharge date of  06/07/19.  Dyanne Iha 05/25/2019, 3:05 PM

## 2019-05-25 NOTE — Care Management (Signed)
[facility] Individual Statement of Services  Patient Name:  [name]  Date:  05/25/2019  Welcome to the [facility].  Our goal is to provide you with an individualized program based on your diagnosis and situation, designed to meet your specific needs.  With this comprehensive rehabilitation program, you will be expected to participate in at least 3 hours of rehabilitation therapies Monday-Friday, with modified therapy programming on the weekends.  Your rehabilitation program will include the following services:  Physical Therapy (PT), Occupational Therapy (OT), Speech Therapy (ST), 24 hour per day rehabilitation nursing, Therapeutic Recreaction (TR), Neuropsychology, Case Management (Social Worker), Rehabilitation Medicine, Nutrition Services and Pharmacy Services  Weekly team conferences will be held on Wednesdays to discuss your progress.  Your Social Worker will talk with you frequently to get your input and to update you on team discussions.  Team conferences with you and your family in attendance may also be held.  Expected length of stay: 06/06/19  Overall anticipated outcome: Supervision  Depending on your progress and recovery, your program may change. Your Social Worker will coordinate services and will keep you informed of any changes. Your Social Worker's name and contact numbers are listed  below.  The following services may also be recommended but are not provided by the [facility]:    [place] will be made to provide these services after discharge if needed.  Arrangements include referral to agencies that provide these services.  Your insurance has been verified to be:  Medicare Your primary doctor is:  Kathyrn Lass, MD  Pertinent information will be shared with your doctor and your insurance company.  Social Worker:  Erlene Quan, Adeline or ([phone]   Information discussed with and copy given to patient by: Dyanne Iha, 05/25/2019, 3:08 PM

## 2019-05-25 NOTE — Progress Notes (Signed)
Social Work Assessment and Plan   Patient Details  Name: Beth [name] MRN: [id number] Date of Birth: [birth year]  Today's Date: 05/25/2019  Problem List:  Patient Active Problem List   Diagnosis Date Noted  . On home O2 05/24/2019  . Acute right arterial ischemic stroke, MCA (middle cerebral artery) ([place]) 05/24/2019  . Stenosis of right carotid artery   . Atrial fibrillation with RVR ([place]) 05/18/2019  . Seizures ([place])   . HLD (hyperlipidemia)   . Macrocytosis   . Acute ischemic right MCA stroke ([place])   . Depression   . Allergic rhinitis 05/02/2019  . At high risk for breast cancer 12/09/2017  . 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 Obesity, morbid, BMI 40.0-49.9 ([place]) 04/30/2016  . Chronic respiratory failure ([place]) 12/11/2015  . COPD without exacerbation ([place]) 10/08/2015  . Genetic testing 08/26/2015  . 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  . Family history of ovarian cancer 07/26/2015  . 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 Benign paroxysmal positional vertigo 04/06/2014  . Tonic-clonic seizure syndrome ([place]) 04/06/2014  . Pulsatile tinnitus of right ear 04/06/2014  . Other forms of epilepsy and recurrent seizures without mention of intractable epilepsy 04/21/2013  . SAH (subarachnoid hemorrhage) ([place]) 04/21/2013  . 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 Seizure disorder, complex partial, 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[place])   . Stroke, hemorrhagic ([place])   . HTN (hypertension)   . Vestibulitis of ear    Past Medical History:  Past Medical History:  Diagnosis Date  . BRCA2 positive 2017  . Diverticulosis   . Hearing loss   . HTN (hypertension)   . Memory loss   . Seizure disorder, complex partial, 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[place]) seizure free for one year  . Seizures ([place])   . Stroke, hemorrhagic ([place]) aneurysm bleed.   . Vestibulitis of ear    Past Surgical History:  Past Surgical History:  Procedure Laterality Date  . broken ankle Right    . CEREBRAL ANEURYSM REPAIR    . TONSILLECTOMY     Social History:  reports that she quit smoking about 13 years ago. She has a 41.00 pack-year smoking history. She has never used smokeless tobacco. She reports that she does not drink alcohol or use drugs.  Family / Support Systems Patient Roles: Spouse Spouse/Significant Other: [name] Children: Daughter Anticipated Caregiver: [name] Caregiver Availability: 24/7  Social History Preferred language: English Religion:  Read: Yes Write: Yes Employment Status: Retired   Abuse/Neglect Abuse/Neglect Assessment Can Be Completed: Yes Physical Abuse: Denies Verbal Abuse: Denies Sexual Abuse: Denies Exploitation of patient/patient's resources: Denies Self-Neglect: Denies  Emotional Status Pt's affect, behavior and adjustment status: no Recent Psychosocial Issues: no Psychiatric History: no Substance Abuse History: no  Patient / Family Perceptions, Expectations & Goals Pt/Family understanding of illness & functional limitations: yes Pt/family expectations/goals: Patient/spouse goal to discharge home  [place]: None Premorbid Home Care/DME Agencies: None Transportation available at discharge: Spouse/Daughter able to transport  Discharge Planning Living Arrangements: Spouse/significant other [place]: Children, Spouse/significant other Type of Residence: Private residence(3 Steps with rails to enter front door, spouse has ramp. 3 at back door) Insurance Resources: [employer] Resources: Radio broadcast assistant Screen Referred: Yes Living Expenses: Rent Money Management: Patient, Spouse Does the patient have any problems obtaining your medications?: No [place] Barriers to Discharge: Medical stability Social Work Anticipated Follow Up Needs: [place] Additional Notes/Comments: SW not aware of any currently Expected length of stay: 06/07/19  Clinical  Impression SW entered room.  Met patient, spouse. Introduced self, explained role and process. Answered questions and concerns. SW will follow up with patient/spouse.  Dyanne Iha 05/25/2019, 3:30 PM

## 2019-05-25 NOTE — Evaluation (Signed)
Physical Therapy Assessment and Plan  Patient Details  Name: Beth [name] MRN: [id number] Date of Birth: [birth year]  PT Diagnosis: Abnormal posture, Coordination disorder, Hemiparesis non-dominant, Impaired sensation and Muscle weakness Rehab Potential: Good ELOS: 2-2.5 weeks   Today's Date: 05/25/2019 PT Individual Time: 1108-1205 PT Individual Time Calculation (min): 57 min    Problem List:  Patient Active Problem List   Diagnosis Date Noted  . On home O2 05/24/2019  . Acute right arterial ischemic stroke, MCA (middle cerebral artery) ([place]) 05/24/2019  . Stenosis of right carotid artery   . Atrial fibrillation with RVR ([place]) 05/18/2019  . Seizures ([place])   . HLD (hyperlipidemia)   . Macrocytosis   . Acute ischemic right MCA stroke ([place])   . Depression   . Allergic rhinitis 05/02/2019  . At high risk for breast cancer 12/09/2017  . 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 Obesity, morbid, BMI 40.0-49.9 ([place]) 04/30/2016  . Chronic respiratory failure ([place]) 12/11/2015  . COPD without exacerbation ([place]) 10/08/2015  . Genetic testing 08/26/2015  . 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  . Family history of ovarian cancer 07/26/2015  . 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 Benign paroxysmal positional vertigo 04/06/2014  . Tonic-clonic seizure syndrome ([place]) 04/06/2014  . Pulsatile tinnitus of right ear 04/06/2014  . Other forms of epilepsy and recurrent seizures without mention of intractable epilepsy 04/21/2013  . SAH (subarachnoid hemorrhage) ([place]) 04/21/2013  . 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 Seizure disorder, complex partial, 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[place])   . Stroke, hemorrhagic ([place])   . HTN (hypertension)   . Vestibulitis of ear     Past Medical History:  Past Medical History:  Diagnosis Date  . BRCA2 positive 2017  . Diverticulosis   . Hearing loss   . HTN (hypertension)   . Memory loss   . Seizure disorder, complex partial,  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[place]) seizure free for one year  . Seizures ([place])   . Stroke, hemorrhagic ([place]) aneurysm bleed.   . Vestibulitis of ear    Past Surgical History:  Past Surgical History:  Procedure Laterality Date  . broken ankle Right   . CEREBRAL ANEURYSM REPAIR    . TONSILLECTOMY      Assessment & Plan Clinical Impression: Patient is a 79 y.o. year old female with  history of HTN, aneurysm rupture with SAH with resultant seizures, hearing loss, COPD with chronic hypoxic respiratory failure--oxygen dependent; who was admitted on 05/16/19 with sudden onset of left sided weakness, sensory loss and neglect. 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  Dr. Erlinda Hong felt the stroke was embolic secondary to right-ICA stenosis and ASA added and and recommended no DAPT due to concerns of thrombosed aneurysm.  CTA was negative for aneurysm and she was cleared to start Plavix if needed for carotid stenting.   Patient did develop A. fib with RVR requiring IV metoprolol and was transitioned to Cardizem for rate control.  Eliquis added due to new onset stroke.   Dr. Scot Dock was consulted for input on ICA stenosis and plans are for transcarotid stenting on 05/25 by Dr. Doristine Section stop Eliquis 48 hours preop on 05/23.  She had reports of left foot pain after twisting it during a transfer. Left ankle/foot films done due to pain and swelling and was negative for fracture--moderate plantar calcaneal spur, Achilles  tendon entheosphyte and mid foot degenerative changes also noted.  Patient continues to have left-sided weakness with proprioceptive deficits affecting ADLs and mobility.  CIR recommended due to functional decline Pt reports LBM this AM- wheezes a lot- admits doesn't like albuterol since makes her heart rate faster.  Asking about L ankle splint/boot  since has twisted ankle a couple of times since admission, with transfers.   Patient transferred to CIR on 05/24/2019 .   Patient currently requires mod with mobility secondary to muscle weakness, decreased coordination and decreased standing balance, decreased postural control, hemiplegia and decreased balance strategies.  Prior to hospitalization, patient was independent  with mobility and lived with Spouse in a House home.  Home access is 4Stairs to enter.  Patient will benefit from skilled PT intervention to maximize safe functional mobility, minimize fall risk and decrease caregiver burden for planned discharge home with 24 hour supervision.  Anticipate patient will benefit from follow up [place] at discharge.  PT - End of Session Activity Tolerance: Tolerates 30+ min activity with multiple rests Endurance Deficit: Yes PT Assessment Rehab Potential (ACUTE/IP ONLY): Good PT Barriers to Discharge: Inaccessible home environment PT Barriers to Discharge Comments: 4 STE PT Patient demonstrates impairments in the following area(s): Balance;Safety;Behavior;Edema;Endurance;Motor;Sensory PT Transfers Functional Problem(s): Bed Mobility;Bed to Chair;Car;Furniture PT Locomotion Functional Problem(s): Ambulation;Wheelchair Mobility;Stairs PT Plan PT Intensity: Minimum of 1-2 x/day ,45 to 90 minutes PT Frequency: 5 out of 7 days PT Duration Estimated Length of Stay: 2-2.5 weeks PT Treatment/Interventions: Ambulation/gait training;Community reintegration;DME/adaptive equipment instruction;Neuromuscular re-education;Stair training;UE/LE Strength taining/ROM;Balance/vestibular training;Discharge planning;Functional electrical stimulation;Pain management;Skin care/wound management;Therapeutic Activities;UE/LE Coordination activities;Cognitive remediation/compensation;Functional mobility training;Patient/family education;Splinting/orthotics;Therapeutic Exercise;Wheelchair propulsion/positioning;Psychosocial  support PT Transfers Anticipated Outcome(s): supervision PT Locomotion Anticipated Outcome(s): supervision PT Recommendation Recommendations for Other Services: Neuropsych consult Follow Up Recommendations: Home health PT Patient destination: Home Equipment Recommended: To be determined  Skilled Therapeutic Intervention Evaluation completed (see details above and below) with education on PT POC and goals and individual treatment initiated with focus on functional mobility, transfers, gait and balance. Pt on 2L O2/min throughout session via [place]. Pt supine in bed upon PT arrival, agreeable to therapy tx and denies pain at rest. Pt does report feeling very tired and she thinks she is getting a sinus infection, limiting participation today. Pt also with L ankle edema and reports she thinks she twisted it on acute, therapist ace wrapped ankle for edema control and support - would benefit from ankle brace. Pt performed sit<>stand from bed with min assist and ambulated into bathroom with mod assist and RW, pt did expereicne x 1 posterior LOB when navigating threshold of bathroom requiring increased assist to correct.  Toilet transfer mod assist with assist for clothing management. When sitting on the toilet pt does report feeling light headed, some increased work of breathing, SpO2 95% and HR 76 bpm, pt reports this feeling improved after seated rest break. Pt reports not having energy to walk out of bathroom. Stand pivot to toilet with mod assist and RW, cues for safety and techniques. Pt transported to the gym and performed car transfer this session mod assist, assist needed to help lift L LE into car. Pt continues to report fatigue and feelin weak today. Pt transported back to room and left in w/c with needs in reach and chair alarm set.    PT Evaluation Precautions/Restrictions Precautions Precautions: Fall(Simultaneous filing. User may not have seen previous data.) Precaution Comments: Left inattention  (Simultaneous filing. User may not have seen previous data.) Restrictions Weight Bearing  Restrictions: No(Simultaneous filing. User may not have seen previous data.) Pain Pain Assessment Pain Scale: Faces Faces Pain Scale: Hurts a little bit Pain Type: Acute pain Pain Location: Ankle Pain Orientation: Left Pain Descriptors / Indicators: Discomfort Pain Onset: With Activity Pain Intervention(s): Repositioned Home Living/Prior Functioning Home Living Available Help at Discharge: Family;Available 24 hours/day Type of Home: House Home Access: Stairs to enter [employer] of Steps: 4 Entrance Stairs-Rails: Right Home Layout: One level Bathroom Shower/Tub: Walk-in shower;Door [employer] Toilet: Handicapped height Bathroom Accessibility: Yes  Lives With: Spouse Prior Function Level of Independence: Independent with gait;Independent with basic ADLs  Able to Take Stairs?: Yes Driving: No Vocation: Retired Comments: previously walked without cane or walker (has both at home).  Did break ankle from past sz. Cognition Overall Cognitive Status: Within Functional Limits for tasks assessed Arousal/Alertness: Awake/alert Orientation Level: Oriented X4 Focused Attention: Appears intact Memory: Appears intact Awareness: Appears intact Safety/Judgment: Appears intact Sensation Sensation Light Touch: Impaired by gross assessment Proprioception: Impaired by gross assessment Additional Comments: pt with diminished sensation L LE compared to R LE. Pt with impaired L UE sensation > L LE Coordination Gross Motor Movements are Fluid and Coordinated: No Fine Motor Movements are Fluid and Coordinated: No Coordination and Movement Description: impaired coordination secondary to strength and sensory deficits Motor  Motor Motor: Hemiplegia Motor - Skilled Clinical Observations: L hemiparesis  Mobility Bed Mobility Bed Mobility: Supine to Sit;Sit to Supine Supine to Sit: Minimal  Assistance - Patient > 75% Sit to Supine: Moderate Assistance - Patient 50-74% Transfers Transfers: Sit to Stand;Stand Pivot Transfers Sit to Stand: Minimal Assistance - Patient > 75% Stand Pivot Transfers: Moderate Assistance - Patient 50 - 74% Stand Pivot Transfer Details: Verbal cues for gait pattern;Verbal cues for technique;Verbal cues for precautions/safety;Verbal cues for safe use of DME/AE Transfer (Assistive device): Rolling walker Locomotion  Gait Ambulation: Yes Gait Assistance: Moderate Assistance - Patient 50-74% Gait Distance (Feet): 10 Feet Assistive device: Rolling walker Gait Assistance Details: Visual cues for safe use of DME/AE;Verbal cues for technique;Verbal cues for safe use of DME/AE;Manual facilitation for weight shifting Gait Gait: Yes Gait Pattern: Step-to pattern;Decreased stance time - left;Decreased dorsiflexion - left;Trunk flexed Gait velocity: decreased Stairs / Additional Locomotion Stairs: No  Trunk/Postural Assessment  Cervical Assessment Cervical Assessment: Exceptions to WFL(forward head posture) Thoracic Assessment Thoracic Assessment: Exceptions to WFL(rounded shoulders) Lumbar Assessment Lumbar Assessment: Exceptions to WFL(posterior pelvic tilt) Postural Control Postural Control: Deficits on evaluation Protective Responses: impaired  Balance Balance Balance Assessed: Yes Static Sitting Balance Static Sitting - Level of Assistance: 5: Stand by assistance Dynamic Sitting Balance Dynamic Sitting - Level of Assistance: 5: Stand by assistance Static Standing Balance Static Standing - Level of Assistance: 4: Min assist Dynamic Standing Balance Dynamic Standing - Level of Assistance: 3: Mod assist;4: Min assist Extremity Assessment  RLE Assessment RLE Assessment: Within Functional Limits LLE Assessment LLE Assessment: Exceptions to [facility] General Strength Comments: weakness and swelling at the ankle limiting strength/ROM. hip flexion 4/5,  quads 3-/5 and hamstrings 4/5    Refer to Care Plan for Long Term Goals  Recommendations for other services: Neuropsych  Discharge Criteria: Patient will be discharged from PT if patient refuses treatment 3 consecutive times without medical reason, if treatment goals not met, if there is a change in medical status, if patient makes no progress towards goals or if patient is discharged from hospital.  The above assessment, treatment plan, treatment alternatives and goals were discussed and mutually agreed upon: by  patient  Netta Corrigan, PT, DPT, CSRS 05/25/2019, 12:19 PM

## 2019-05-25 NOTE — Evaluation (Signed)
Occupational Therapy Assessment and Plan  Patient Details  Name: Beth [name] MRN: [id number] Date of Birth: 1940-11-05  OT Diagnosis: abnormal posture, hemiplegia affecting non-dominant side and muscle weakness (generalized) Rehab Potential: Rehab Potential (ACUTE ONLY): Excellent ELOS: 14-16 days   Today's Date: 05/25/2019 OT Individual Time: 0350-0938 OT Individual Time Calculation (min): 62 min     Problem List:  Patient Active Problem List   Diagnosis Date Noted  . On home O2 05/24/2019  . Acute right arterial ischemic stroke, MCA (middle cerebral artery) ([place]) 05/24/2019  . Stenosis of right carotid artery   . Atrial fibrillation with RVR ([place]) 05/18/2019  . Seizures ([place])   . HLD (hyperlipidemia)   . Macrocytosis   . Acute ischemic right MCA stroke ([place])   . Depression   . Allergic rhinitis 05/02/2019  . At high risk for breast cancer 12/09/2017  . 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 Obesity, morbid, BMI 40.0-49.9 ([employer]) 04/30/2016  . Chronic respiratory failure ([place]) 12/11/2015  . COPD without exacerbation ([place]) 10/08/2015  . Genetic testing 08/26/2015  . 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  . Family history of ovarian cancer 07/26/2015  . 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 Benign paroxysmal positional vertigo 04/06/2014  . Tonic-clonic seizure syndrome ([place]) 04/06/2014  . Pulsatile tinnitus of right ear 04/06/2014  . Other forms of epilepsy and recurrent seizures without mention of intractable epilepsy 04/21/2013  . SAH (subarachnoid hemorrhage) ([place]) 04/21/2013  . 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 Seizure disorder, complex partial, 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[place])   . Stroke, hemorrhagic ([place])   . HTN (hypertension)   . Vestibulitis of ear     Past Medical History:  Past Medical History:  Diagnosis Date  . BRCA2 positive 2017  . Diverticulosis   . Hearing loss   . HTN (hypertension)   . Memory loss   . Seizure  disorder, complex partial, 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[place]) seizure free for one year  . Seizures ([place])   . Stroke, hemorrhagic ([place]) aneurysm bleed.   . Vestibulitis of ear    Past Surgical History:  Past Surgical History:  Procedure Laterality Date  . broken ankle Right   . CEREBRAL ANEURYSM REPAIR    . TONSILLECTOMY      Assessment & Plan Clinical Impression: Patient is a 79 y.o. year old female with recent admission to the hospital on 05/16/19 with sudden onset of left sided weakness, sensory loss and neglect. 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  Patient transferred to CIR on 05/24/2019 .    Patient currently requires mod with basic self-care skills secondary to muscle weakness, decreased cardiorespiratoy endurance, impaired timing and sequencing, unbalanced muscle activation and decreased coordination and decreased sitting balance, decreased standing balance, decreased postural control, hemiplegia and decreased balance strategies.  Prior to hospitalization, patient could complete ADLs with independent .  Patient will benefit from skilled intervention to decrease level of assist with basic self-care skills and increase independence with basic self-care skills prior to discharge home with care partner.  Anticipate patient will require 24 hour supervision and follow up home health.  OT - End of Session Activity Tolerance: Improving Endurance Deficit: Yes OT Assessment Rehab Potential (ACUTE ONLY): Excellent OT Barriers to Discharge: Other (comments) OT Barriers to Discharge Comments: Pt reports that spouse has some cognitive issues and can only provide  limited assist. OT Patient demonstrates impairments in the following area(s): Balance;Endurance;Motor;Sensory OT Basic ADL's Functional Problem(s):  Eating;Grooming;Bathing;Dressing;Toileting OT Transfers Functional Problem(s): Tub/Shower;Toilet OT Additional Impairment(s): Fuctional Use of Upper Extremity OT Plan OT Intensity: Minimum of 1-2 x/day, 45 to 90 minutes OT Frequency: 5 out of 7 days OT Duration/Estimated Length of Stay: 14-16 days OT Treatment/Interventions: Balance/vestibular training;Discharge planning;Functional electrical stimulation;Pain management;Self Care/advanced ADL retraining;Therapeutic Activities;UE/LE Coordination activities;Cognitive remediation/compensation;Disease mangement/prevention;Functional mobility training;Patient/family education;Therapeutic Exercise;Visual/perceptual remediation/compensation;Community reintegration;DME/adaptive equipment instruction;Neuromuscular re-education;Psychosocial support;Splinting/orthotics;UE/LE Strength taining/ROM;Wheelchair propulsion/positioning OT Self Feeding Anticipated Outcome(s): modified independent OT Basic Self-Care Anticipated Outcome(s): supervision OT Toileting Anticipated Outcome(s): supervision OT Bathroom Transfers Anticipated Outcome(s): supervision OT Recommendation Recommendations for Other Services: Therapeutic Recreation consult Therapeutic Recreation Interventions: Stress management Patient destination: Home Follow Up Recommendations: Home health OT;24 hour supervision/assistance Equipment Recommended: To be determined   Skilled Therapeutic Intervention Pt worked on selfcare retraining sit to stand from the EOB.  She needed mod assist for transition to sitting with supervision for static sitting balance.  She needed 2Ls supplemental O2 with sats at 93% on this.  She worked on bathing tasks and dressing, demonstrating left inattention to the UE.  She would only try to integrate it into the tasks when cued by therapist and then would state "I can't do anything with that arm, I have no strength".  She needed mod assist for sit to stand and for stand pivot  transfer to the bedside commode.  She was able to complete toilet hygiene with min guard assist.  Worked on LB dressing from the [facility] with total assist for donning pants as well as gripper socks.  She also needed max assist for donning and fastening her front clasping bra with mod assist for donning a pullover shirt.  Finished session with transition to supine with min assist and call button and phone in reach.  Safety alarm on as well.  Discussed expectations for LOS as well as for projection of supervision goals with relation to self care tasks and transfers.  Pt is in agreement with this as she reports that her spouse cannot provide much physical or cognitive assist based on his own health issues.    OT Evaluation Precautions/Restrictions  Precautions Precautions: Fall(Simultaneous filing. User may not have seen previous data.) Precaution Comments: Left inattention (Simultaneous filing. User may not have seen previous data.) Restrictions Weight Bearing Restrictions: No(Simultaneous filing. User may not have seen previous data.)  Pain Pain Assessment Pain Scale: Faces Faces Pain Scale: Hurts a little bit Pain Type: Acute pain Pain Location: Ankle Pain Orientation: Left Pain Descriptors / Indicators: Discomfort Pain Onset: With Activity Pain Intervention(s): Repositioned Home Living/Prior [place] expects to be discharged to:: Private residence Living Arrangements: Spouse/significant other Available Help at Discharge: Family, Available 24 hours/day Type of Home: House Home Access: Stairs to enter Technical brewer of Steps: 4 Entrance Stairs-Rails: Right Home Layout: One level Bathroom Shower/Tub: Gaffer, Charity fundraiser: Handicapped height Bathroom Accessibility: Yes  Lives With: Spouse IADL History Homemaking Responsibilities: Yes Current License: No Occupation: Retired Prior Function Level of Independence: Independent with  gait, Independent with basic ADLs  Able to Take Stairs?: Yes Driving: No Vocation: Retired Comments: previously walked without cane or walker (has both at home).  Did break ankle from past sz. ADL ADL Eating: Supervision/safety Where Assessed-Eating: Bed level Grooming: Minimal assistance Where Assessed-Grooming: Edge of bed Upper Body Bathing: Minimal assistance Where Assessed-Upper Body Bathing: Edge of bed Lower Body Bathing: Moderate assistance Where Assessed-Lower Body Bathing: Sitting  at sink, Edge of bed Upper Body Dressing: Maximal cueing Where Assessed-Upper Body Dressing: Edge of bed Lower Body Dressing: Maximal assistance Where Assessed-Lower Body Dressing: Edge of bed Toileting: Moderate assistance Where Assessed-Toileting: Bedside Commode Toilet Transfer: Moderate assistance Toilet Transfer Method: Stand pivot Toilet Transfer Equipment: Bedside commode Vision Baseline Vision/History: Wears glasses Wears Glasses: At all times Patient Visual Report: No change from baseline(initial blurring but now seems to be back to baseline) Vision Assessment?: (To be further evaled in treatment) Perception  Perception: Impaired Inattention/Neglect: Does not attend to left side of body(needs cueing to integrate the LUE into functional tasks) Praxis Praxis: Intact Cognition Overall Cognitive Status: Within Functional Limits for tasks assessed Arousal/Alertness: Awake/alert Orientation Level: Person;Place;Situation Person: Oriented Place: Oriented Situation: Oriented Year: 2021 Month: May Day of Week: Correct Memory: Appears intact Immediate Memory Recall: Sock;Blue;Bed Memory Recall Sock: Without Cue Memory Recall Blue: Without Cue Memory Recall Bed: Without Cue Attention: Focused;Sustained Focused Attention: Appears intact Sustained Attention: Appears intact Awareness: Appears intact Reasoning: Appears intact Decision Making: Appears intact Safety/Judgment: Appears  intact Sensation Sensation Light Touch: Impaired Detail Light Touch Impaired Details: Impaired LUE Proprioception: Impaired Detail Proprioception Impaired Details: Impaired LUE Additional Comments: Pt unable to detect light touch distally from the elbow down in the LUE Coordination Gross Motor Movements are Fluid and Coordinated: No Fine Motor Movements are Fluid and Coordinated: No Coordination and Movement Description: Pt needed mod hand over hand assist to integrate the LUE into functional tasks such as holding deodorant to remove the top or when applying it under the right arm. Motor  Motor Motor: Hemiplegia Motor - Skilled Clinical Observations: L hemiparesis Mobility  Bed Mobility Bed Mobility: Sit to Supine;Supine to Sit Supine to Sit: Moderate Assistance - Patient 50-74% Sit to Supine: Minimal Assistance - Patient > 75% Transfers Sit to Stand: Moderate Assistance - Patient 50-74% Stand to Sit: Moderate Assistance - Patient 50-74%  Trunk/Postural Assessment  Cervical Assessment Cervical Assessment: Exceptions to WFL(forward head) Thoracic Assessment Thoracic Assessment: Exceptions to WFL(thoracic rounding) Lumbar Assessment Lumbar Assessment: Exceptions to WFL(posterior pelvic tilt) Postural Control Postural Control: Deficits on evaluation Protective Responses: impaired  Balance Balance Balance Assessed: Yes Static Sitting Balance Static Sitting - Balance Support: Feet supported Static Sitting - Level of Assistance: 5: Stand by assistance Dynamic Sitting Balance Dynamic Sitting - Balance Support: During functional activity Dynamic Sitting - Level of Assistance: 4: Min assist(contact guard assist) Static Standing Balance Static Standing - Balance Support: During functional activity Static Standing - Level of Assistance: 4: Min assist Dynamic Standing Balance Dynamic Standing - Balance Support: During functional activity Dynamic Standing - Level of Assistance: 3:  Mod assist Extremity/Trunk Assessment RUE Assessment RUE Assessment: Within Functional Limits General Strength Comments: AROM WFLs for selfcare tasks, but not formally assessed LUE Assessment LUE Assessment: Exceptions to [facility] Active Range of Motion (AROM) Comments: AAROM WFLS for all joints General Strength Comments: Pt currently Brunnstrum stage V movement in the arm and hand.  She demonstrates isolated movements deviating away from synergy, but needs mod assist hand over hand secondary to decreased sensory feedback as well as decreased coordination.  Gross digit flexion and extension present but unable to oppose thumb to any digits.     Refer to Care Plan for Long Term Goals  Recommendations for other services: Therapeutic Recreation  Stress management   Discharge Criteria: Patient will be discharged from OT if patient refuses treatment 3 consecutive times without medical reason, if treatment goals not met, if there is a  change in medical status, if patient makes no progress towards goals or if patient is discharged from hospital.  The above assessment, treatment plan, treatment alternatives and goals were discussed and mutually agreed upon: by patient  Woodfin Kiss OTR/L 05/25/2019, 12:48 PM

## 2019-05-25 NOTE — Evaluation (Signed)
Speech Language Pathology Assessment and Plan  Patient Details  Name: Beth [name] MRN: [id number] Date of Birth: 1940/04/10  SLP Diagnosis: Cognitive Impairments  Rehab Potential: Excellent ELOS: 2 weeks    Today's Date: 05/25/2019 SLP Individual Time: 5364-6803 SLP Individual Time Calculation (min): 61 min   Problem List:  Patient Active Problem List   Diagnosis Date Noted  . On home O2 05/24/2019  . Acute right arterial ischemic stroke, MCA (middle cerebral artery) ([place]) 05/24/2019  . Stenosis of right carotid artery   . Atrial fibrillation with RVR ([place]) 05/18/2019  . Seizures ([place])   . HLD (hyperlipidemia)   . Macrocytosis   . Acute ischemic right MCA stroke ([place])   . Depression   . Allergic rhinitis 05/02/2019  . At high risk for breast cancer 12/09/2017  . 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 Obesity, morbid, BMI 40.0-49.9 ([place]) 04/30/2016  . Chronic respiratory failure ([place]) 12/11/2015  . COPD without exacerbation ([place]) 10/08/2015  . Genetic testing 08/26/2015  . 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  . Family history of ovarian cancer 07/26/2015  . 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 Benign paroxysmal positional vertigo 04/06/2014  . Tonic-clonic seizure syndrome ([place]) 04/06/2014  . Pulsatile tinnitus of right ear 04/06/2014  . Other forms of epilepsy and recurrent seizures without mention of intractable epilepsy 04/21/2013  . SAH (subarachnoid hemorrhage) ([place]) 04/21/2013  . 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 Seizure disorder, complex partial, 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[place])   . Stroke, hemorrhagic ([place])   . HTN (hypertension)   . Vestibulitis of ear    Past Medical History:  Past Medical History:  Diagnosis Date  . BRCA2 positive 2017  . Diverticulosis   . Hearing loss   . HTN (hypertension)   . Memory loss   . Seizure disorder, complex partial, 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[place]) seizure free for one year  . Seizures  ([place])   . Stroke, hemorrhagic ([place]) aneurysm bleed.   . Vestibulitis of ear    Past Surgical History:  Past Surgical History:  Procedure Laterality Date  . broken ankle Right   . CEREBRAL ANEURYSM REPAIR    . TONSILLECTOMY      Assessment / Plan / Recommendation Clinical Impression   HPI: Beth [name] is a 79 year old female with history of HTN, aneurysm rupture with SAH with resultant seizures, hearing loss, COPD with chronic hypoxic respiratory failure--oxygen dependent; who was admitted on 05/16/19 with sudden onset of left sided weakness, sensory loss and neglect. 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  Dr. Erlinda Hong felt the stroke was embolic secondary to right-ICA stenosis and ASA added and and recommended no DAPT due to concerns of thrombosed aneurysm.  CTA was negative for aneurysm and she was cleared to start Plavix if needed for carotid stenting.   Patient did develop A. fib with RVR requiring IV metoprolol and was transitioned to Cardizem for rate control.  Eliquis added due to new onset stroke.   Dr. Scot Dock was consulted for input on ICA stenosis and plans are for transcarotid stenting on 05/25 by Dr. Doristine Section stop Eliquis 48 hours preop on 05/23.  She had reports of left foot pain after twisting it during a transfer. Left ankle/foot films done due to pain and swelling and was negative for fracture--moderate plantar calcaneal spur, Achilles tendon entheosphyte and  mid foot degenerative changes also noted.  Patient continues to have left-sided weakness with proprioceptive deficits affecting ADLs and mobility.  Pt admitted to CIR 05/24/19 and SLP evaluation was completed 05/25/19 with results as follows:  Pt presents with mild higher level cognitive deficits marked by decreased complex problem solving skills, reduced recall of new  functional information, and selective attention to tasks. Pt's only score that fell outside of normal limits on Cognistat evaluation was on visual construction subtest (2 = severe). She demonstrated difficulty problem solving use of her inhaler, and although she only required 1 categorical cue to recall 1/4 words on delayed recall task, she reported having difficulty recalling her doctor's name, timeline of hospitalization, carrying over transfer techniques she learned from PT, etc. Therefore, recommend pt receive skilled ST services while inpatient to address above listed cognitive deficits to maximize her functional independence and safety at discharge.    Skilled Therapeutic Interventions          Cognitive-linguistic evaluation was administered and results were reviewed with pt (please see above for details regarding results).   SLP Assessment  Patient will need skilled [place] [facility] Services during CIR admission    Recommendations  Patient destination: Home Follow up Recommendations: 24 hour supervision/assistance;Other (comment)(TBD depending on progress) Equipment Recommended: None recommended by SLP    SLP Frequency 3 to 5 out of 7 days   SLP Duration  SLP Intensity  SLP Treatment/Interventions 2 weeks  Minumum of 1-2 x/day, 30 to 90 minutes  Cognitive remediation/compensation;Cueing hierarchy;Functional tasks;Patient/family education;Internal/external aids    Pain Pain Assessment Pain Scale: Faces Pain Score: 0-No pain  Prior Functioning Type of Home: House  Lives With: Spouse Available Help at Discharge: Family;Available 24 hours/day Vocation: Retired  Programmer, systems Overall Cognitive Status: Impaired/Different from baseline Arousal/Alertness: Awake/alert Orientation Level: Oriented to person;Oriented to place;Oriented to situation(oriented to month) Attention: Selective Focused Attention: Appears intact Sustained Attention: Appears  intact Selective Attention: Impaired Selective Attention Impairment: Verbal basic;Functional basic Memory: Impaired Memory Impairment: Decreased recall of new information Immediate Memory Recall: Sock;Blue;Bed Memory Recall Sock: Without Cue Memory Recall Blue: Without Cue Memory Recall Bed: Without Cue Awareness: Appears intact Problem Solving: Impaired Problem Solving Impairment: Functional complex;Verbal complex Executive Function: Decision Making;Reasoning;Organizing Reasoning: Appears intact Organizing: Impaired Organizing Impairment: Functional complex;Verbal complex Decision Making: Appears intact Safety/Judgment: Appears intact  Comprehension Auditory Comprehension Overall Auditory Comprehension: Appears within functional limits for tasks assessed Yes/No Questions: Within Functional Limits Commands: Within Functional Limits Conversation: Complex Visual Recognition/Discrimination Discrimination: Not tested Reading Comprehension Reading Status: Within funtional limits Expression Expression Primary Mode of Expression: Verbal Verbal Expression Overall Verbal Expression: Appears within functional limits for tasks assessed Initiation: No impairment Level of Generative/Spontaneous Verbalization: Conversation Repetition: No impairment Naming: No impairment Pragmatics: No impairment Non-Verbal Means of Communication: Not applicable Written Expression Dominant Hand: Right Written Expression: Not tested Oral Motor Oral Motor/Sensory Function Overall Oral Motor/Sensory Function: Within functional limits Motor Speech Overall Motor Speech: Appears within functional limits for tasks assessed Respiration: Within functional limits Phonation: Normal Resonance: Within functional limits Articulation: Within functional limitis Intelligibility: Intelligible Motor Planning: Witnin functional limits Motor Speech Errors: Not applicable   Intelligibility: Intelligible   Short  Term Goals: Week 1: SLP Short Term Goal 1 (Week 1): Pt will demonstrate ability to problem solve complex functional situations with Supervision A cues. SLP Short Term Goal 2 (Week 1): Pt will selectively attend to tasks with Supervision A cues for redirection. SLP Short Term Goal 3 (Week 1): Pt  will recall new and/or daily information with Supervision A cues for use of strategies or aids.  Refer to Care Plan for Long Term Goals  Recommendations for other services: None  and Neuropsych  Discharge Criteria: Patient will be discharged from SLP if patient refuses treatment 3 consecutive times without medical reason, if treatment goals not met, if there is a change in medical status, if patient makes no progress towards goals or if patient is discharged from hospital.  The above assessment, treatment plan, treatment alternatives and goals were discussed and mutually agreed upon: by patient  Arbutus Leas 05/25/2019, 3:07 PM

## 2019-05-25 NOTE — Progress Notes (Signed)
[place] PHYSICAL MEDICINE & REHABILITATION PROGRESS NOTE   Subjective/Complaints:  No issues overnite   ROS- neg CP SOB N/V/D  Objective:   No results found. Recent Labs    05/24/19 0315 05/25/19 0543  WBC 13.2* 11.0*  HGB 12.6 11.9*  HCT 37.7 35.7*  PLT 294 272   Recent Labs    05/25/19 0543  NA 134*  K 3.5  CL 93*  CO2 30  GLUCOSE 139*  BUN 9  CREATININE 0.72  CALCIUM 8.9    Intake/Output Summary (Last 24 hours) at 05/25/2019 0914 Last data filed at 05/25/2019 0440 Gross per 24 hour  Intake 120 ml  Output 1301 ml  Net -1181 ml     Physical Exam: Vital Signs Blood pressure (!) 153/71, pulse 70, temperature 98.1 F (36.7 C), temperature source Oral, resp. rate 16, last menstrual period 01/14/1995, SpO2 94 %.   General: No acute distress Mood and affect are appropriate Heart: Regular rate and rhythm no rubs murmurs or extra sounds Lungs: Clear to auscultation, breathing unlabored, no rales or wheezes Abdomen: Positive bowel sounds, soft nontender to palpation, nondistended Extremities: No clubbing, cyanosis, or edema Skin: No evidence of breakdown, no evidence of rash Neurologic: Cranial nerves II through XII intact, motor strength is 5/5 in r ight deltoid, bicep, tricep, grip, hip flexor, knee extensors, ankle dorsiflexor and plantar flexor 3/5 left delt bi tri grip, 2- HF, 3- KE trace ankle DF  Sensory exam normal sensation to light touch and proprioception in bilateral upper and lower extremities Cerebellar exam normal finger to nose to finger as well as heel to shin in bilateral upper and lower extremities Musculoskeletal: Full range of motion in all 4 extremities. No joint swelling   Assessment/Plan: 1. 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  Physiatrist is providing close team supervision and 24  hour management of active medical problems listed below.  Physiatrist and rehab team continue to assess barriers to discharge/monitor patient progress toward functional and medical goals  Care Tool:  Bathing              Bathing assist       Upper Body Dressing/Undressing Upper body dressing   What is the patient wearing?: Hospital gown only    Upper body assist Assist Level: Minimal Assistance - Patient > 75%    Lower Body Dressing/Undressing Lower body dressing            Lower body assist       Toileting Toileting    Toileting assist Assist for toileting: Moderate Assistance - Patient 50 - 74%     Transfers Chair/bed transfer  Transfers assist     Chair/bed transfer assist level: Moderate Assistance - Patient 50 - 74%     Locomotion Ambulation   Ambulation assist              Walk 10 feet activity   Assist           Walk 50 feet activity   Assist           Walk 150 feet activity   Assist           Walk 10 feet on uneven surface  activity   Assist           Wheelchair     Assist  Wheelchair 50 feet with 2 turns activity    Assist            Wheelchair 150 feet activity     Assist          Blood pressure (!) 153/71, pulse 70, temperature 98.1 F (36.7 C), temperature source Oral, resp. rate 16, last menstrual period 01/14/1995, SpO2 94 %.    Medical Problem List and Plan: 1.  Impaired function, mobility and ADLs with L hemiparesis secondary to R MCA stroke             -patient may  Shower- asking for one ASAP- initial evals today              -ELOS/Goals: 10-14 days- mod I to supervision 2.  Antithrombotics: -DVT/anticoagulation:  Pharmaceutical: Other (comment)--Eliquis             -antiplatelet therapy: on DAPT 3. Pain Management: Continue Klonopin for "pain' and seizures  4. Mood: LCSW to follow for evaluation and support.              -antipsychotic agents:  N/A 5. Neuropsych: This patient is capable of making decisions on her own behalf. 6. Skin/Wound Care: Routine pressure measures.  7. Fluids/Electrolytes/Nutrition: Monitor I/O. Check lytes in am.  8. HTN: Monitor BP tid--continue Capoten, Cardizem and Maxzide.  Vitals:   05/24/19 1955 05/25/19 0454  BP: (!) 155/84 (!) 153/71  Pulse: 78 70  Resp:    Temp: 98.2 F (36.8 C) 98.1 F (36.7 C)  SpO2: 98% 94%   9. Seizure: Has been controlled on Klonopin and Keppra bid.   10. Leucocytosis: Monitor for signs of infection.  11. New onset A fib: Monitor HR tid--on Cardizem and Eliquis - rate is controlled  12. Chronic constipation: Managed with Miralax  13. H/o Depression: On Lexapro and Klonopin.  14. 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  Continue Stiolto. Will add Claritin and nose spray to help with nasal congestion.  Also added Mucinex DM to help with congested cough.  Check chest x-ray due to reports of worsening. Needs xopenex (since has tachycardia with albuterol) as needed for SOB/wheezing.  15.  Left ankle sprain: Will order ASO.  Ice tid as well as Voltaren gel to Achilles tendon to help with pain., PRAFO at noc  LOS: 1 days A FACE TO [name] 05/25/2019, 9:14 AM

## 2019-05-25 NOTE — Progress Notes (Signed)
Inpatient Rehabilitation  Patient information reviewed and entered into eRehab system by Jadarian Mckay M. Jimia Gentles, M.A., CCC/SLP, PPS Coordinator.  Information including medical coding, functional ability and quality indicators will be reviewed and updated through discharge.    

## 2019-05-25 NOTE — Progress Notes (Signed)
Called ortho for ASO

## 2019-05-25 NOTE — Progress Notes (Signed)
Orthopedic Tech Progress Note Patient Details:  Beth [name] [birth year] [id number] Patient was asleep, talked to nurse and she instructed to drop off in room. Ortho Devices Type of Ortho Device: Prafo boot/shoe Ortho Device/Splint Location: LLE Ortho Device/Splint Interventions: Ordered, Application   Post Interventions Patient Tolerated: Well Instructions Provided: Adjustment of device, Care of device   Beth [name] 05/25/2019, 2:42 PM

## 2019-05-26 ENCOUNTER — Inpatient Hospital Stay (HOSPITAL_COMMUNITY): Payer: Medicare Other | Admitting: Speech Pathology

## 2019-05-26 ENCOUNTER — Inpatient Hospital Stay (HOSPITAL_COMMUNITY): Payer: Medicare Other | Admitting: Occupational Therapy

## 2019-05-26 ENCOUNTER — Inpatient Hospital Stay (HOSPITAL_COMMUNITY): Payer: Medicare Other | Admitting: Physical Therapy

## 2019-05-26 MED ORDER — TIOTROPIUM BROMIDE-OLODATEROL 2.5-2.5 MCG/ACT IN AERS
1.0000 | INHALATION_SPRAY | Freq: Two times a day (BID) | RESPIRATORY_TRACT | Status: DC
  Administered 2019-05-26 – 2019-06-02 (×15): 1 via RESPIRATORY_TRACT

## 2019-05-26 MED ORDER — TIOTROPIUM BROMIDE-OLODATEROL 2.5-2.5 MCG/ACT IN AERS: 2.0000 | INHALATION_SPRAY | Freq: Two times a day (BID) | RESPIRATORY_TRACT | Status: DC

## 2019-05-26 MED ORDER — LEVALBUTEROL HCL 0.63 MG/3ML IN NEBU
0.6300 mg | INHALATION_SOLUTION | Freq: Two times a day (BID) | RESPIRATORY_TRACT | Status: DC
  Administered 2019-05-27 (×2): 0.63 mg via RESPIRATORY_TRACT
  Filled 2019-05-26 (×3): qty 3

## 2019-05-26 NOTE — Progress Notes (Signed)
Physical Therapy Session Note  Patient Details  Name: Beth [name] MRN: [id number] Date of Birth: April 09, 1940  Today's Date: 05/26/2019 PT Individual Time: [id number] PT Individual Time Calculation (min): 81 min   Short Term Goals: Week 1:  PT Short Term Goal 1 (Week 1): Pt will ambulate x 50 ft with RW and min assist PT Short Term Goal 2 (Week 1): Pt will ascend/descend 4 steps with single rail and min assist PT Short Term Goal 3 (Week 1): Pt will perform bed<>chair transfer with CGA and LRAD  Skilled Therapeutic Interventions/Progress Updates:   Pt received sitting in w/c with her husband, Jenny Reichmann, and the dietary staff present placing her meal orders for tomorrow - pt requires increase time to complete this task. Pt agreeable to therapy session. Pt with increased verbosity throughout session. Pt received and maintained on 2L of O2 throughout session - this is pt's baseline at home due to COPD. Therapist donned B LE shoes and L LE soft ankle brace max assist for time management. Pt reports need to use bathroom. Sit<>stands using RW with CGA/min assist for balance during session - cuing for L hand placement on/off RW orthotic. Gait ~39ft to bathroom using RW with min assist for balance - slow gait speed with decreased B LE step lengths. Standing with min assist for balance performed LB clothing management with mod assist (cuing for increased incorporation of L UE into task). Continent of bladder and bowels - seated peri-care with close supervision for sitting balance. Gait ~23ft back to w/c with pt requiring seated rest break - demonstrates increased difficulty stepping down bathroom threshold and L hand slipping off RW orthotic. Seated hand hygiene.  Transported to/from gym in w/c for time management and energy conservation. Therapist provided pt with smaller L hand orthotic for RW. Ascended/descended 8 (3" height) steps using B HRs (therapist facilitating L UE support on rail) with min assist for ascent  and mod assist for descent - pt self-selecting reciprocal pattern on ascent and attempted reciprocal pattern on descent but had significant L ankle pain when stepping down leading with R LE due to L ankle being pushed into DF - cuing for step-to pattern leading with L LE. SpO2 92% after recovering to 97%. Gait training ~61ft using RW with CGA/min assist for balance - continues to demonstrate decreased B LE step length and slow gait speed. R stand pivot transfer to w/c using RW with CGA/min assist for balance. Transported back to room. Gait ~25ft to recliner using RW with CGA/min assist and cuing for sequencing LE stepping and AD management around obstacles in room. Left seated in recliner with B LEs elevated to aid in L LE edema, needs in reach, and chair alarm on.  Therapy Documentation Precautions:  Precautions Precautions: Fall(Simultaneous filing. User may not have seen previous data.) Precaution Comments: Left inattention (Simultaneous filing. User may not have seen previous data.) Restrictions Weight Bearing Restrictions: No  Pain: Reports L ankle pain; notable edema on dorsum of foot and around medial/latearl malleoli - therapist donned soft ankle brace for session and pt reports this helps.   Therapy/Group: Individual Therapy  Tawana Scale, PT, DPT 05/26/2019, 12:57 PM

## 2019-05-26 NOTE — Progress Notes (Signed)
Occupational Therapy Session Note  Patient Details  Name: Beth [name] MRN: [id number] Date of Birth: December 22, 1940  Today's Date: 05/26/2019 OT Individual Time: [id number] OT Individual Time Calculation (min): 77 min    Short Term Goals: Week 1:  OT Short Term Goal 1 (Week 1): Pt will complete UB dressing with supervision for pullover shirt for two consecutive sessions. OT Short Term Goal 2 (Week 1): Pt will complete LB dressing for donning pants with min assist following hemi dressing techniques for two consecutive sessions. OT Short Term Goal 3 (Week 1): Pt will complete toilet transfers with min assist using the RW for support. OT Short Term Goal 4 (Week 1): Pt will use the LUE as a stabilizer during selfcare tasks with supervision and no more than min instructional cueing for integration.  Skilled Therapeutic Interventions/Progress Updates:    Pt transferred from supine to sit EOB with min assist to begin session.  Oxygen sats were at 95-96% on 2Ls nasal cannula at rest.  She completed functional mobility to the toilet with use of the RW and hand splint on the left at mod assist level.  She was able to complete clothing management sit to stand with mod assist as well.  Toilet hygiene was performed in sitting with lateral lean and min guard assist.   She integrated the LUE during toileting tasks to help tear her toilet paper with mod assist overall.  She did exhibit decreased ability to manipulate and hold onto the paper when attempting to switch it to the right hand.  From the toilet, she then transferred over to the shower seat to finish undressing and for completion of bathing tasks.  Mod hand held assist needed for transfer with use of the grab bars for support, along with therapist assist.  She was able to complete all bathing sit to stand with overall mod assist.  She completed UB bathing with min assist and mod facilitation to integrate the LUE to wash the right arm and underarm.  Min assist  was needed for sit to stand in the shower in order for pt to wash her front and back peri area.  She placed her RLE up on the shower seat to complete this, with therapist discussing with her that this would not be the safest option after her current stroke.  Pt declined completion of washing her hair secondary to fatigue.  After drying off, she transferred to the wheelchair with mod assist and worked on dressing tasks and grooming tasks at the sink.  She needed setup for oral hygiene in sitting with max assist for placing her hair back in a pony tail.  Max assist was needed for donning a front fastening bra as well as mod assist for pullover shirt with therapist assist secondary to decreased time.  LB dressing was completed at max assist level as well secondary to time limitations.  Pt was left sitting up in the wheelchair with call button and phone in reach and safety belt in place.    Therapy Documentation Precautions:  Precautions Precautions: Fall Precaution Comments: Left inattention,  O2 dependent at home Restrictions Weight Bearing Restrictions: No  Pain:  No report of pain  ADL: See Care Tool Section for some details of mobility and selfcare  Therapy/Group: Individual Therapy  Eliav Mechling OTR/L 05/26/2019, 4:26 PM

## 2019-05-26 NOTE — Progress Notes (Signed)
Speech Language Pathology Daily Session Note  Patient Details  Name: LATIGRA MIKRUT MRN: [id number] Date of Birth: 1940/01/30  Today's Date: 05/26/2019 SLP Individual Time: [id number] SLP Individual Time Calculation (min): 40 min  Short Term Goals: Week 1: SLP Short Term Goal 1 (Week 1): Pt will demonstrate ability to problem solve complex functional situations with Supervision A cues. SLP Short Term Goal 2 (Week 1): Pt will selectively attend to tasks with Supervision A cues for redirection. SLP Short Term Goal 3 (Week 1): Pt will recall new and/or daily information with Supervision A cues for use of strategies or aids.  Skilled Therapeutic Interventions: Skilled treatment session focused on cognitive goals. SLP facilitated session by providing Min A verbal cues for recall of her current medications and their functions. Patient requested to organize a pill box during next session and SLP provided verbal education on ideas to maximize independence and efficiency with task due to weakness in her LUE. Patient verbalized understanding. Patient left upright in bed with alarm on and all needs within reach. Continue with current plan of care.      Pain No/Denies Pain   Therapy/Group: Individual Therapy  Donelle Hise 05/26/2019, 12:31 PM

## 2019-05-26 NOTE — Patient Care Conference (Signed)
Inpatient RehabilitationTeam Conference and Plan of Care Update Date: 05/26/2019   Time: 8:47 AM    Patient Name: Beth [name]      Medical Record Number: [id number]  Date of Birth: 1940-11-27 Sex: Female         Room/Bed: 4W22C/4W22C-01 Payor Info: Payor: MEDICARE / Plan: MEDICARE PART A AND B / Product Type: *No Product type* /    Admit Date/Time:  05/24/2019  2:37 PM  Primary Diagnosis:  Acute ischemic right MCA stroke [facility])  Patient Active Problem List   Diagnosis Date Noted  . On home O2 05/24/2019  . Acute right arterial ischemic stroke, MCA (middle cerebral artery) ([place]) 05/24/2019  . Stenosis of right carotid artery   . Atrial fibrillation with RVR ([place]) 05/18/2019  . Seizures ([place])   . HLD (hyperlipidemia)   . Macrocytosis   . Acute ischemic right MCA stroke ([place])   . Depression   . Allergic rhinitis 05/02/2019  . At high risk for breast cancer 12/09/2017  . 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 Obesity, morbid, BMI 40.0-49.9 ([place]) 04/30/2016  . Chronic respiratory failure ([place]) 12/11/2015  . COPD without exacerbation ([place]) 10/08/2015  . Genetic testing 08/26/2015  . 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  . Family history of ovarian cancer 07/26/2015  . 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 Benign paroxysmal positional vertigo 04/06/2014  . Tonic-clonic seizure syndrome ([place]) 04/06/2014  . Pulsatile tinnitus of right ear 04/06/2014  . Other forms of epilepsy and recurrent seizures without mention of intractable epilepsy 04/21/2013  . SAH (subarachnoid hemorrhage) ([place]) 04/21/2013  . 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 Stroke, hemorrhagic ([place])   . HTN (hypertension)   . Vestibulitis of ear     Expected Discharge Date: Expected Discharge Date: 06/07/19  Team Members Present: Physician leading conference: Dr. Alysia Penna Care Coodinator Present: Nestor Lewandowsky, RN, BSN, CRRN;Christina Sampson Goon, BSW Nurse Present: Isla Pence, RN PT Present: Michaelene Song, PT OT Present: Clyda Greener, OT SLP Present: Jettie Booze, CF-SLP PPS Coordinator present : Ileana Ladd, Burna Mortimer, SLP     Current Status/Progress Goal Weekly Team Focus  Bowel/Bladder   pt cont of b/b lbm 05/24/19  remain cont of bowel and bladder  asess q shift and prn    Swallow/Nutrition/ Hydration             ADL's   Min assist for UB  bathing with mod assist for UB dressing.  Max assist for for LB bathing and dressing sit to stand.  Mod assist for toilet transfers with mod assist for clothing management and toilet hygiene.  supervision overall  selfcare retraining, transfer training, balance retraining, neuromuscular re-education,  LUE coordination, DME education, pt/family education   Mobility   min-mod assist for all mobility up to 10 ft gait with RW  supervision  strength, balance, L NMR, gait, endurance   Communication             Safety/Cognition/ Behavioral Observations            Pain   pt c/o of no pain at this time   reman pain free  assess pai  q shift and prn    Skin   pt has scattered bruising  remain free from breakdown and skin infection  assess skin q shift and prn     Rehab Goals Patient on target to meet rehab goals: Yes Rehab  Goals Revised: Patient goals currently being evaluated *See Care Plan and progress notes for long and short-term goals.     Barriers to Discharge  Current Status/Progress Possible Resolutions Date Resolved   Nursing  Medical stability               PT  Inaccessible home environment  4 STE              OT Other (comments)  Pt reports that spouse has some cognitive issues and can only provide limited assist.             SLP                Care Coordinator Medical stability   SW not aware of barriers currently.          Discharge Planning/Teaching Needs:  Goal to discharge home  Will schedule if needed    Team Discussion:  Estimated two week stay currently min-max assist and set supervision goals, right neglect and poor proprioception addressed with hand over hand assistance  Revisions to Treatment Plan:      Medical Summary Current Status: Left hemiparesis, some problems with sitting balance.  Mild wheezing history of COPD O2 dependent Weekly Focus/Goal: Initiate rehabilitation program  Barriers to Discharge: Pending Surgery;Medical stability  Barriers to Discharge Comments: Will need surgery on 5/25 Possible Resolutions to Barriers: Will structure rehab program to accommodate surgical scheduling   Continued Need for Acute Rehabilitation Level of Care: The patient requires daily medical management by a physician with specialized training in physical medicine and rehabilitation for the following reasons: Direction of a multidisciplinary physical rehabilitation program to maximize functional independence : Yes Medical management of patient stability for increased activity during participation in an intensive rehabilitation regime.: Yes Analysis of laboratory values and/or radiology reports with any subsequent need for medication adjustment and/or medical intervention. : Yes   I attest that I was present, lead the team conference, and concur with the assessment and plan of the team.   Dorien Chihuahua B 05/26/2019, 8:47 AM

## 2019-05-26 NOTE — Progress Notes (Signed)
Patient ID: Beth [name], female   DOB: 1940/04/12, 79 y.o.   MRN: [id number]   SW received note to follow up with patient's previous and potential SW at discharge. Lenon Oms [phone]. SW clarified the plan is to discharge patient from [facility] before transitioning to her surgery.

## 2019-05-26 NOTE — Plan of Care (Signed)
  Problem: Consults Goal: RH STROKE PATIENT EDUCATION Description: See Patient Education module for education specifics  Outcome: Progressing   Problem: RH KNOWLEDGE DEFICIT Goal: RH STG INCREASE KNOWLEDGE OF HYPERTENSION Description: Pt will demonstrate knowledge of blood pressure management including diet, medications, and activity at discharge with cues/reminders to assist and use of handout/books, etc.  Outcome: Progressing Goal: RH STG INCREASE KNOWLEGDE OF HYPERLIPIDEMIA Description: Pt will demonstrate knowledge of cholesterol management including diet and medications for stroke prevention at discharge with cues/reminders to assist and use of handouts, books, etc Outcome: Progressing Goal: RH STG INCREASE KNOWLEDGE OF STROKE PROPHYLAXIS Description: Pt will be able to verbalize personal risk factors for stroke and prevention methods at the time of discharge with cues and reminders to use handouts and books Outcome: Progressing

## 2019-05-26 NOTE — IPOC Note (Signed)
Overall Plan of Care [facility]) Patient Details Name: Beth [name] MRN: [id number] DOB: 01/05/1941  Admitting Diagnosis: Acute ischemic right MCA stroke [facility])  Hospital Problems: Principal Problem:   Acute ischemic right MCA stroke ([place]) Active Problems:   HTN (hypertension)   COPD without exacerbation ([place])   Stenosis of right carotid artery   On home O2   Acute right arterial ischemic stroke, MCA (middle cerebral artery) ([place])     Functional Problem List: Nursing Endurance, Motor, Sensory  PT Balance, Safety, Behavior, Edema, Endurance, Motor, Sensory  OT Balance, Endurance, Motor, Sensory  SLP Cognition  TR         Basic ADL's: OT Eating, Grooming, Bathing, Dressing, Toileting     Advanced  ADL's: OT       Transfers: PT Bed Mobility, Bed to Chair, Car, Patent attorney, Agricultural engineer: PT Ambulation, Emergency planning/management officer, Stairs     Additional Impairments: OT Fuctional Use of Upper Extremity  SLP Social Cognition   Memory, Problem Solving, Attention  TR      Anticipated Outcomes Item Anticipated Outcome  Self Feeding modified independent  Swallowing      Basic self-care  supervision  Toileting  supervision   Bathroom Transfers supervision  Bowel/Bladder  Pt will manage bowel and bladder with Mod I assist  Transfers  supervision  Locomotion  supervision  Communication     Cognition  Mod I  Pain  Pt will manage pain at 2 or less on a scale of 0-10.  Safety/Judgment  Pt will remain free of falls with injury while in rehab with min assist/cues.   Therapy Plan: PT Intensity: Minimum of 1-2 x/day ,45 to 90 minutes PT Frequency: 5 out of 7 days PT Duration Estimated Length of Stay: 2-2.5 weeks OT Intensity: Minimum of 1-2 x/day, 45 to 90 minutes OT Frequency: 5 out of 7 days OT Duration/Estimated Length of Stay: 14-16 days SLP Intensity: Minumum of 1-2 x/day, 30 to 90 minutes SLP Frequency: 3 to 5 out of 7 days SLP  Duration/Estimated Length of Stay: 2 weeks   Due to the current state of emergency, patients may not be receiving their 3-hours of Medicare-mandated therapy.   Team Interventions: Nursing Interventions Patient/Family Education, Disease Management/Prevention, Medication Management, Discharge Planning  PT interventions Ambulation/gait training, Community reintegration, DME/adaptive equipment instruction, Neuromuscular re-education, Stair training, UE/LE Strength taining/ROM, Training and development officer, Discharge planning, Functional electrical stimulation, Pain management, Skin care/wound management, Therapeutic Activities, UE/LE Coordination activities, Cognitive remediation/compensation, Functional mobility training, Patient/family education, Splinting/orthotics, Therapeutic Exercise, Wheelchair propulsion/positioning, Psychosocial support  OT Interventions Balance/vestibular training, Discharge planning, Functional electrical stimulation, Pain management, Self Care/advanced ADL retraining, Therapeutic Activities, UE/LE Coordination activities, Cognitive remediation/compensation, Disease mangement/prevention, Functional mobility training, Patient/family education, Therapeutic Exercise, Visual/perceptual remediation/compensation, Community reintegration, DME/adaptive equipment instruction, Neuromuscular re-education, Psychosocial support, Splinting/orthotics, UE/LE Strength taining/ROM, Wheelchair propulsion/positioning  SLP Interventions Cognitive remediation/compensation, English as a second language teacher, Functional tasks, Patient/family education, Internal/external aids  TR Interventions    SW/CM Interventions Discharge Planning, Psychosocial Support, Patient/Family Education   Barriers to Discharge MD  Medical stability and Pending surgery  Nursing Medical stability    PT Inaccessible home environment 4 STE  OT Other (comments) Pt reports that spouse has some cognitive issues and can only provide limited  assist.  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  Dr. Erlinda Hong felt the stroke was embolic secondary to right-ICA stenosis and ASA added and and recommended no DAPT due to concerns of thrombosed aneurysm.  CTA was negative for aneurysm and she was cleared to start Plavix if needed for carotid stenting.   Patient did develop A. fib with RVR requiring IV metoprolol and was transitioned to Cardizem for rate control.  Eliquis added due to new onset stroke.    Dr. Scot Dock was consulted for input on ICA stenosis and plans are for transcarotid stenting on 05/25 by Dr. Doristine Section stop Eliquis 48 hours preop on 05/23.  She had reports of left foot pain after twisting it during a transfer. Left ankle/foot films done due to pain and swelling and was negative for fracture--moderate plantar calcaneal spur, Achilles tendon entheosphyte and mid foot degenerative changes also noted.  Patient continues to have left-sided weakness with proprioceptive deficits affecting  ADLs and mobility.  CIR recommended due to functional decline  Plan to transfer to Vascular surgery 5/25  Now requiring 24/7 Rehab RN,MD, as well as CIR level PT, OT and SLP.  Treatment team will focus on ADLs and mobility with goals set at Supervision See Team Conference Notes for weekly updates to the plan of care

## 2019-05-26 NOTE — Progress Notes (Signed)
[facility] PHYSICAL MEDICINE & REHABILITATION PROGRESS NOTE   Subjective/Complaints:  Talkative this morning asking about her chest x-ray.  We discussed the findings.  Occasional cough.  She has not taken any as needed nebulizer treatments.  She was started on her long-acting steroid inhaler  ROS- neg CP SOB N/V/D  Objective:   DG Chest 2 View  Result Date: 05/25/2019 CLINICAL DATA:  Wheezing for 2 days EXAM: CHEST - 2 VIEW COMPARISON:  July 30, 2015 FINDINGS: The heart size and mediastinal contours are within normal limits. Both lungs are clear. Again noted is mild hyperinflation of the upper lung zones. Calcified granulomata seen within the left upper lung and right lung base. The visualized skeletal structures are unremarkable. IMPRESSION: No active cardiopulmonary disease.  Findings suggestive of COPD. Electronically Signed   By: Prudencio Pair M.D.   On: 05/25/2019 20:13   Recent Labs    05/24/19 0315 05/25/19 0543  WBC 13.2* 11.0*  HGB 12.6 11.9*  HCT 37.7 35.7*  PLT 294 272   Recent Labs    05/25/19 0543  NA 134*  K 3.5  CL 93*  CO2 30  GLUCOSE 139*  BUN 9  CREATININE 0.72  CALCIUM 8.9    Intake/Output Summary (Last 24 hours) at 05/26/2019 1635 Last data filed at 05/26/2019 0700 Gross per 24 hour  Intake 600 ml  Output 542 ml  Net 58 ml     Physical Exam: Vital Signs Blood pressure (!) 152/77, pulse 95, temperature 97.9 F (36.6 C), resp. rate 16, weight 107.8 kg, last menstrual period 01/14/1995, SpO2 95 %.  General: No acute distress Mood and affect are appropriate Heart: Regular rate and rhythm no rubs murmurs or extra sounds Lungs: Clear to auscultation, breathing unlabored, no rales or wheezes Abdomen: Positive bowel sounds, soft nontender to palpation, nondistended Extremities: No clubbing, cyanosis, or edema Skin: No evidence of breakdown, no evidence of rash   3/5 left delt bi tri grip, 2- HF, 3- KE trace ankle DF  Sensory exam normal sensation  to light touch and proprioception in bilateral upper and lower extremities Cerebellar exam normal finger to nose to finger as well as heel to shin in bilateral upper and lower extremities Musculoskeletal: Full range of motion in all 4 extremities. No joint swelling   Assessment/Plan: 1. 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  Physiatrist is providing close team supervision and 24 hour management of active medical problems listed below.  Physiatrist and rehab team continue to assess barriers to discharge/monitor patient progress toward functional and medical goals  Care Tool:  Bathing    Body parts bathed by patient: Left arm, Chest, Abdomen, Front perineal area, Right upper leg, Left upper leg, Face, Right lower leg   Body parts bathed by helper: Right arm, Left lower leg, Buttocks     Bathing assist Assist Level: Moderate Assistance - Patient 50 - 74%     Upper Body Dressing/Undressing Upper body dressing   What is the patient wearing?: Pull over shirt    Upper body assist Assist Level: Maximal Assistance - Patient 25 - 49%    Lower Body Dressing/Undressing Lower body dressing      What is the patient wearing?: Underwear/pull up, Pants     Lower body assist Assist for lower body dressing: Maximal Assistance - Patient 25 - 49%     Toileting Toileting  Toileting assist Assist for toileting: Moderate Assistance - Patient 50 - 74%     Transfers Chair/bed transfer  Transfers assist     Chair/bed transfer assist level: Minimal Assistance - Patient > 75% Chair/bed transfer assistive device: Programmer, multimedia   Ambulation assist      Assist level: Minimal Assistance - Patient > 75% Assistive device: Walker-rolling Max distance: 57ft   Walk 10 feet activity   Assist     Assist level:  Minimal Assistance - Patient > 75% Assistive device: Walker-rolling   Walk 50 feet activity   Assist Walk 50 feet with 2 turns activity did not occur: Safety/medical concerns(fatigue, poor endurance)         Walk 150 feet activity   Assist Walk 150 feet activity did not occur: Safety/medical concerns         Walk 10 feet on uneven surface  activity   Assist Walk 10 feet on uneven surfaces activity did not occur: Safety/medical concerns         Wheelchair     Assist Will patient use wheelchair at discharge?: No             Wheelchair 50 feet with 2 turns activity    Assist            Wheelchair 150 feet activity     Assist          Blood pressure (!) 152/77, pulse 95, temperature 97.9 F (36.6 C), resp. rate 16, weight 107.8 kg, last menstrual period 01/14/1995, SpO2 95 %.    Medical Problem List and Plan: 1.  Impaired function, mobility and ADLs with L hemiparesis secondary to R MCA stroke             -patient may  Shower- asking for one ASAP- initial evals today              -ELOS/Goals: 10-14 days- mod I to supervision 2.  Antithrombotics: -DVT/anticoagulation:  Pharmaceutical: Other (comment)--Eliquis             -antiplatelet therapy: on DAPT 3. Pain Management: Continue Klonopin for "pain' and seizures  4. Mood: LCSW to follow for evaluation and support.              -antipsychotic agents: N/A 5. Neuropsych: This patient is capable of making decisions on her own behalf. 6. Skin/Wound Care: Routine pressure measures.  7. Fluids/Electrolytes/Nutrition: Monitor I/O. Check lytes in am.  8. HTN: Monitor BP tid--continue Capoten, Cardizem and Maxzide.  Vitals:   05/26/19 0930 05/26/19 1542  BP:  (!) 152/77  Pulse: (!) 102 95  Resp:  16  Temp:  97.9 F (36.6 C)  SpO2:  95%   9. Seizure: Has been controlled on Klonopin and Keppra bid.   10. Leucocytosis: Monitor for signs of infection.  11. New onset A fib: Monitor HR tid--on  Cardizem and Eliquis - rate is up , systolic still elevated will increase cardizem , if this persists 12. Chronic constipation: Managed with Miralax  13. H/o Depression: On Lexapro and Klonopin.  14. 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  Continue Stiolto. Will add Claritin and nose spray to help with nasal congestion.  Also added Mucinex DM to help with congested cough.  Check chest x-ray due to reports of worsening. Schedule xopenex (since has tachycardia with albuterol) as needed for SOB/wheezing.  15.  Left ankle sprain: Will order ASO.  Ice tid as well as  Voltaren gel to Achilles tendon to help with pain., PRAFO at noc  LOS: 2 days A FACE TO [place] [facility] Narda Fundora 05/26/2019, 4:35 PM

## 2019-05-27 ENCOUNTER — Inpatient Hospital Stay (HOSPITAL_COMMUNITY): Payer: Medicare Other | Admitting: Physical Therapy

## 2019-05-27 ENCOUNTER — Other Ambulatory Visit: Payer: Self-pay

## 2019-05-27 ENCOUNTER — Inpatient Hospital Stay (HOSPITAL_COMMUNITY): Payer: Medicare Other | Admitting: Occupational Therapy

## 2019-05-27 ENCOUNTER — Encounter (HOSPITAL_COMMUNITY): Payer: Medicare Other | Admitting: Psychology

## 2019-05-27 ENCOUNTER — Inpatient Hospital Stay (HOSPITAL_COMMUNITY): Payer: Medicare Other | Admitting: Speech Pathology

## 2019-05-27 LAB — URINALYSIS, ROUTINE W REFLEX MICROSCOPIC
Bilirubin Urine: NEGATIVE
Glucose, UA: NEGATIVE mg/dL
Ketones, ur: NEGATIVE mg/dL
Nitrite: NEGATIVE
Protein, ur: NEGATIVE mg/dL
Specific Gravity, Urine: 1.006 (ref 1.005–1.030)
pH: 7 (ref 5.0–8.0)

## 2019-05-27 MED ORDER — WHITE PETROLATUM EX OINT
TOPICAL_OINTMENT | CUTANEOUS | Status: AC
  Administered 2019-05-27: 0.2
  Filled 2019-05-27: qty 28.35

## 2019-05-27 NOTE — Progress Notes (Signed)
Occupational Therapy Session Note  Patient Details  Name: Beth [name] MRN: [id number] Date of Birth: November 06, 1940  Today's Date: 05/27/2019 OT Individual Time: 0905-1001 OT Individual Time Calculation (min): 56 min    Short Term Goals: Week 1:  OT Short Term Goal 1 (Week 1): Pt will complete UB dressing with supervision for pullover shirt for two consecutive sessions. OT Short Term Goal 2 (Week 1): Pt will complete LB dressing for donning pants with min assist following hemi dressing techniques for two consecutive sessions. OT Short Term Goal 3 (Week 1): Pt will complete toilet transfers with min assist using the RW for support. OT Short Term Goal 4 (Week 1): Pt will use the LUE as a stabilizer during selfcare tasks with supervision and no more than min instructional cueing for integration.  Skilled Therapeutic Interventions/Progress Updates:    Pt in bed to start session with reports of having a breathing treatment earlier that had caused her to be dizzy and slight nauseas.  BP taken in supine with HOB at 43 degrees at 138/87.  Oxygen sats were at 97% on 2Ls nasal cannula.  Pt kept eyes closed initially secondary to being extremely dizzy.  She did transition to the EOB with min assist and then completed stand pivot transfer to the bedside commode at the same level.  She was able to then complete toilet hygiene with min guard assist in sitting before transitioning back to the EOB with min assist.  Pt with increased anxiety throughout session, stating that she was fearful of falling and that she needs a lot of assistance, even though she was able to complete transfers and standing at min assist.  She completed oral hygiene in sitting with setup using the RUE.  Mod assist for LUE functional use as a stabilizer when holding the toothpaste to be opened.  She transitioned back to supine at the end of the limited session with min guard assist.  Nursing was notified of pt's dizziness as no vestibular issues  were noted to be the cause with gross observation.  She was left with the call button and phone in reach with safety alarm in place.    Therapy Documentation Precautions:  Precautions Precautions: Fall Precaution Comments: Left inattention,  O2 dependent at home Restrictions Weight Bearing Restrictions: No   Pain: Pain Assessment Pain Scale: Faces Faces Pain Scale: Hurts a little bit Pain Type: Acute pain Pain Location: Head Pain Orientation: Mid Pain Descriptors / Indicators: Aching;Headache Pain Onset: On-going Pain Intervention(s): Repositioned ADL: See Care Tool Section for some details of mobility and selfcare  Therapy/Group: Individual Therapy  Lilyanah Celestin  OTR/L 05/27/2019, 12:34 PM

## 2019-05-27 NOTE — Progress Notes (Signed)
Physical Therapy Session Note  Patient Details  Name: Beth [name] MRN: [id number] Date of Birth: 10-05-1940  Today's Date: 05/27/2019 PT Individual Time: 1410-1418 PT Individual Time Calculation (min): 8 min   and  Today's Date: 05/27/2019 PT Missed Time: 67 Minutes Missed Time Reason: Patient ill (Comment)  Short Term Goals: Week 1:  PT Short Term Goal 1 (Week 1): Pt will ambulate x 50 ft with RW and min assist PT Short Term Goal 2 (Week 1): Pt will ascend/descend 4 steps with single rail and min assist PT Short Term Goal 3 (Week 1): Pt will perform bed<>chair transfer with CGA and LRAD  Skilled Therapeutic Interventions/Progress Updates:   Pt received supine in bed asleep, but awakens to verbal stimulus. Pt reports she has been having a severe R sided headache today with "dizziness" and "lightheadedness" as well as the sensation of "feeling like I'm not a boat" with nausea. Pt reports tylenol administered ~1hour ago and her headache is "lightening up." Pt politely declines therapy at this time requesting to rest today and hoping for a more productive session tomorrow. Therapist noticed pt wearing L soft ankle brace with pt reporting she has had it on since yesterday - therapist educated pt about taking it off at night and only wearing during the day with mobility. Pt left supine in bed with needs in reach, lines intact, and bed alarm on. Missed 67 minutes of skilled physical therapy.  Therapy Documentation Precautions:  Precautions Precautions: Fall Precaution Comments: Left inattention,  O2 dependent at home Restrictions Weight Bearing Restrictions: No  Therapy/Group: Individual Therapy  Tawana Scale, PT, DPT 05/27/2019, 12:25 PM

## 2019-05-27 NOTE — Progress Notes (Signed)
Speech Language Pathology Daily Session Note  Patient Details  Name: ANISHA ITA MRN: [id number] Date of Birth: 1940/08/29  Today's Date: 05/27/2019 SLP Individual Time: [id number] SLP Individual Time Calculation (min): 25 min  Short Term Goals: Week 1: SLP Short Term Goal 1 (Week 1): Pt will demonstrate ability to problem solve complex functional situations with Supervision A cues. SLP Short Term Goal 2 (Week 1): Pt will selectively attend to tasks with Supervision A cues for redirection. SLP Short Term Goal 3 (Week 1): Pt will recall new and/or daily information with Supervision A cues for use of strategies or aids.  Skilled Therapeutic Interventions: Skilled treatment session focused on cognitive goals. SLP facilitated session by providing supervision level verbal cues for complex problem solving during a BID pill box organization task. Patient reported that she felt "woozy" when looking down to complete the task. RN aware and patient reported she suspected it was due to her new breathing treatment she received this morning. Recommend to continue task during next session. Patient left upright in bed with alarm on and all needs within reach. Continue with current plan of care.      Pain No/Denies Pain   Therapy/Group: Individual Therapy  Abigal Choung 05/27/2019, 3:26 PM

## 2019-05-27 NOTE — Progress Notes (Signed)
[name] PHYSICAL MEDICINE & REHABILITATION PROGRESS NOTE   Subjective/Complaints:  Freq urination with burning , no fever .  Hs occ bladder infx in pst, none recently  Breathing is better just had xopenex   ROS- neg CP SOB N/V/D  Objective:   DG Chest 2 View  Result Date: 05/25/2019 CLINICAL DATA:  Wheezing for 2 days EXAM: CHEST - 2 VIEW COMPARISON:  July 30, 2015 FINDINGS: The heart size and mediastinal contours are within normal limits. Both lungs are clear. Again noted is mild hyperinflation of the upper lung zones. Calcified granulomata seen within the left upper lung and right lung base. The visualized skeletal structures are unremarkable. IMPRESSION: No active cardiopulmonary disease.  Findings suggestive of COPD. Electronically Signed   By: Prudencio Pair M.D.   On: 05/25/2019 20:13   Recent Labs    05/25/19 0543  WBC 11.0*  HGB 11.9*  HCT 35.7*  PLT 272   Recent Labs    05/25/19 0543  NA 134*  K 3.5  CL 93*  CO2 30  GLUCOSE 139*  BUN 9  CREATININE 0.72  CALCIUM 8.9    Intake/Output Summary (Last 24 hours) at 05/27/2019 0801 Last data filed at 05/27/2019 0533 Gross per 24 hour  Intake 400 ml  Output 200 ml  Net 200 ml     Physical Exam: Vital Signs Blood pressure 140/81, pulse 90, temperature 97.6 F (36.4 C), temperature source Oral, resp. rate 18, height 5\' 4"  (1.626 m), weight 107.8 kg, last menstrual period 01/14/1995, SpO2 98 %.   General: No acute distress Mood and affect are appropriate Heart: Regular rate and rhythm no rubs murmurs or extra sounds Lungs: Clear to auscultation, breathing unlabored, no rales or wheezes Abdomen: Positive bowel sounds, soft nontender to palpation, nondistended Extremities: No clubbing, cyanosis, or edema Skin: No evidence of breakdown, no evidence of rash  3/5 left delt bi tri grip, 2- HF, 3- KE trace ankle DF  Sensory exam normal sensation to light touch and proprioception in bilateral upper and lower  extremities Cerebellar exam normal finger to nose to finger as well as heel to shin in bilateral upper and lower extremities Musculoskeletal: Full range of motion in all 4 extremities. No joint swelling   Assessment/Plan: 1. 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  Physiatrist is providing close team supervision and 24 hour management of active medical problems listed below.  Physiatrist and rehab team continue to assess barriers to discharge/monitor patient progress toward functional and medical goals  Care Tool:  Bathing    Body parts bathed by patient: Left arm, Chest, Abdomen, Front perineal area, Right upper leg, Left upper leg, Face, Right lower leg   Body parts bathed by helper: Right arm, Left lower leg, Buttocks     Bathing assist Assist Level: Moderate Assistance - Patient 50 - 74%     Upper Body Dressing/Undressing Upper body dressing   What is the patient wearing?: Pull over shirt    Upper body assist Assist Level: Minimal Assistance - Patient > 75%    Lower Body Dressing/Undressing Lower body dressing      What is the patient wearing?: Pants     Lower body assist Assist for lower body dressing: Minimal Assistance - Patient > 75%     Toileting Toileting    Toileting assist Assist for toileting: Moderate Assistance - Patient 50 -  74%     Transfers Chair/bed transfer  Transfers assist     Chair/bed transfer assist level: Minimal Assistance - Patient > 75% Chair/bed transfer assistive device: Museum/gallery exhibitions officer assist      Assist level: Minimal Assistance - Patient > 75% Assistive device: Walker-rolling Max distance: 63ft   Walk 10 feet activity   Assist     Assist level: Minimal Assistance - Patient > 75% Assistive device: Walker-rolling   Walk 50 feet  activity   Assist Walk 50 feet with 2 turns activity did not occur: Safety/medical concerns(fatigue, poor endurance)         Walk 150 feet activity   Assist Walk 150 feet activity did not occur: Safety/medical concerns         Walk 10 feet on uneven surface  activity   Assist Walk 10 feet on uneven surfaces activity did not occur: Safety/medical concerns         Wheelchair     Assist Will patient use wheelchair at discharge?: No             Wheelchair 50 feet with 2 turns activity    Assist            Wheelchair 150 feet activity     Assist          Blood pressure 140/81, pulse 90, temperature 97.6 F (36.4 C), temperature source Oral, resp. rate 18, height 5\' 4"  (1.626 m), weight 107.8 kg, last menstrual period 01/14/1995, SpO2 98 %.    Medical Problem List and Plan: 1.  Impaired function, mobility and ADLs with L hemiparesis secondary to R MCA stroke             -patient may  Shower-              -ELOS/Goals:DC 5/25 to VVS service for R ICA stenting Dr Donzetta Matters  2.  Antithrombotics: -DVT/anticoagulation:  Pharmaceutical: Other (comment)--Eliquis             -antiplatelet therapy: on DAPT 3. Pain Management: Continue Klonopin for "pain' and seizures  4. Mood: LCSW to follow for evaluation and support.              -antipsychotic agents: N/A 5. Neuropsych: This patient is capable of making decisions on her own behalf. 6. Skin/Wound Care: Routine pressure measures.  7. Fluids/Electrolytes/Nutrition: Monitor I/O. Check lytes in am.  8. HTN: Monitor BP tid--continue Capoten, Cardizem and Maxzide.  Vitals:   05/27/19 0513 05/27/19 0757  BP: 140/81   Pulse: 90   Resp: 18   Temp: 97.6 F (36.4 C)   SpO2: 97% 98%   9. Seizure: Has been controlled on Klonopin and Keppra bid.   10. Leucocytosis: Monitor for signs of infection.  11. New onset A fib: Monitor HR tid--on Cardizem and Eliquis - rate is up , systolic still elevated will  increase cardizem , if this persists 12. Chronic constipation: Managed with Miralax  13. H/o Depression: On Lexapro and Klonopin.  14. 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  Continue Stiolto. Will add Claritin and nose spray to help with nasal congestion.  Also added Mucinex DM to help with congested cough.  Check chest x-ray due to reports of worsening. Schedule xopenex (since has tachycardia with albuterol) as needed for SOB/wheezing.  15.  Left ankle sprain: Will order ASO.  Ice tid as well as Voltaren gel to Achilles tendon to help with  pain., PRAFO at noc  LOS: 3 days A FACE TO FACE EVALUATION WAS PERFORMED  Charlett Blake 05/27/2019, 8:01 AM

## 2019-05-28 ENCOUNTER — Inpatient Hospital Stay (HOSPITAL_COMMUNITY): Payer: Medicare Other

## 2019-05-28 ENCOUNTER — Inpatient Hospital Stay (HOSPITAL_COMMUNITY): Payer: Medicare Other | Admitting: Physical Therapy

## 2019-05-28 ENCOUNTER — Inpatient Hospital Stay (HOSPITAL_COMMUNITY): Payer: Medicare Other | Admitting: Speech Pathology

## 2019-05-28 LAB — URINE CULTURE: Culture: <10000 — AB

## 2019-05-28 MED ORDER — GUAIFENESIN ER 600 MG PO TB12
600.0000 mg | ORAL_TABLET | Freq: Two times a day (BID) | ORAL | Status: DC | PRN
  Administered 2019-05-28 – 2019-06-03 (×5): 600 mg via ORAL
  Filled 2019-05-28 (×5): qty 1

## 2019-05-28 MED ORDER — WHITE PETROLATUM EX OINT
TOPICAL_OINTMENT | CUTANEOUS | Status: AC
  Filled 2019-05-28: qty 28.35

## 2019-05-28 MED ORDER — DICLOFENAC SODIUM 1 % EX GEL
2.0000 g | Freq: Four times a day (QID) | CUTANEOUS | Status: DC
  Filled 2019-05-28: qty 100

## 2019-05-28 MED ORDER — LEVALBUTEROL HCL 0.63 MG/3ML IN NEBU: 0.6300 mg | INHALATION_SOLUTION | Freq: Four times a day (QID) | RESPIRATORY_TRACT | Status: DC | PRN

## 2019-05-28 MED ORDER — BACITRACIN ZINC 500 UNIT/GM EX OINT
TOPICAL_OINTMENT | Freq: Two times a day (BID) | CUTANEOUS | Status: DC
  Administered 2019-06-04: 1 via TOPICAL
  Filled 2019-05-28 (×4): qty 28.35

## 2019-05-28 NOTE — Progress Notes (Signed)
Orthopedic Tech Progress Note Patient Details:  Beth [name] July 22, 1940 [id number]  Patient ID: [name], female   DOB: [birth year], 79 y.o.   MRN: [id number] I called and spoke with RN. She said pt had aso and would call if things changed.  Karolee Stamps 05/28/2019, 8:09 PM

## 2019-05-28 NOTE — Progress Notes (Signed)
[facility] PHYSICAL MEDICINE & REHABILITATION PROGRESS NOTE   Subjective/Complaints:  Receiving patient care As per RN, complaining of sinus pain and already receiving nasal spray and claritin. Meds reviewed and does not appear to be using nasal spray. Will add Mucinex.  SBP low but diastolic high  ROS- neg CP SOB N/V/D  Objective:   No results found. No results for input(s): WBC, HGB, HCT, PLT in the last 72 hours. No results for input(s): NA, K, CL, CO2, GLUCOSE, BUN, CREATININE, CALCIUM in the last 72 hours.  Intake/Output Summary (Last 24 hours) at 05/28/2019 1822 Last data filed at 05/28/2019 1330 Gross per 24 hour  Intake 1120 ml  Output --  Net 1120 ml     Physical Exam: Vital Signs Blood pressure (!) 114/100, pulse 75, temperature 97.8 F (36.6 C), temperature source Oral, resp. rate 19, height 5\' 4"  (1.626 m), weight 107.8 kg, last menstrual period 01/14/1995, SpO2 94 %. General: No acute distress, receiving patient care Mood and affect are appropriate Heart: Regular rate and rhythm no rubs murmurs or extra sounds Lungs: Clear to auscultation, breathing unlabored, no rales or wheezes Abdomen: Positive bowel sounds, soft nontender to palpation, nondistended Extremities: No clubbing, cyanosis, or edema Skin: No evidence of breakdown, no evidence of rash  3/5 left delt bi tri grip, 2- HF, 3- KE trace ankle DF  Sensory exam normal sensation to light touch and proprioception in bilateral upper and lower extremities Cerebellar exam normal finger to nose to finger as well as heel to shin in bilateral upper and lower extremities Musculoskeletal: Full range of motion in all 4 extremities. No joint swelling   Assessment/Plan: 1. 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  Physiatrist is providing close team supervision and 24  hour management of active medical problems listed below.  Physiatrist and rehab team continue to assess barriers to discharge/monitor patient progress toward functional and medical goals  Care Tool:  Bathing    Body parts bathed by patient: Left arm, Chest, Abdomen, Front perineal area, Right upper leg, Left upper leg, Face, Right lower leg   Body parts bathed by helper: Right arm     Bathing assist Assist Level: Moderate Assistance - Patient 50 - 74%     Upper Body Dressing/Undressing Upper body dressing   What is the patient wearing?: Hospital gown only    Upper body assist Assist Level: Minimal Assistance - Patient > 75%    Lower Body Dressing/Undressing Lower body dressing      What is the patient wearing?: Pants     Lower body assist Assist for lower body dressing: Minimal Assistance - Patient > 75%     Toileting Toileting    Toileting assist Assist for toileting: Minimal Assistance - Patient > 75%     Transfers Chair/bed transfer  Transfers assist     Chair/bed transfer assist level: Minimal Assistance - Patient > 75% Chair/bed transfer assistive device: Programmer, multimedia   Ambulation assist      Assist level: Minimal Assistance - Patient > 75% Assistive device: Walker-rolling Max distance: 44ft   Walk 10 feet activity   Assist     Assist level: Minimal Assistance - Patient > 75% Assistive device: Walker-rolling   Walk 50 feet activity   Assist Walk 50 feet with 2 turns activity did not occur: Safety/medical concerns(fatigue, poor endurance)  Walk 150 feet activity   Assist Walk 150 feet activity did not occur: Safety/medical concerns         Walk 10 feet on uneven surface  activity   Assist Walk 10 feet on uneven surfaces activity did not occur: Safety/medical concerns         Wheelchair     Assist Will patient use wheelchair at discharge?: No             Wheelchair 50 feet with 2  turns activity    Assist            Wheelchair 150 feet activity     Assist          Blood pressure (!) 114/100, pulse 75, temperature 97.8 F (36.6 C), temperature source Oral, resp. rate 19, height 5\' 4"  (1.626 m), weight 107.8 kg, last menstrual period 01/14/1995, SpO2 94 %.    Medical Problem List and Plan: 1.  Impaired function, mobility and ADLs with L hemiparesis secondary to R MCA stroke             -patient may  Shower-              -ELOS/Goals:DC 5/25 to VVS service for R ICA stenting Dr Donzetta Matters  2.  Antithrombotics: -DVT/anticoagulation:  Pharmaceutical: Other (comment)--Eliquis             -antiplatelet therapy: on DAPT 3. Pain Management: Continue Klonopin for "pain' and seizures. 4. Mood: LCSW to follow for evaluation and support.              -antipsychotic agents: N/A 5. Neuropsych: This patient is capable of making decisions on her own behalf. 6. Skin/Wound Care: Routine pressure measures.  7. Fluids/Electrolytes/Nutrition: Monitor I/O. Check lytes in am.  8. HTN: Monitor BP tid--continue Capoten, Cardizem and Maxzide.   5/15:  SBP low but diastolic high- continue to monitor.  Vitals:   05/28/19 0552 05/28/19 1404  BP: (!) 134/95 (!) 114/100  Pulse: 92 75  Resp:  19  Temp:  97.8 F (36.6 C)  SpO2: 96% 94%   9. Seizure: Has been controlled on Klonopin and Keppra bid.   10. Leucocytosis: Monitor for signs of infection.  11. New onset A fib: Monitor HR tid--on Cardizem and Eliquis - rate is up , systolic still elevated will increase cardizem , if this persists  5/15: rate controlled 12. Chronic constipation: Managed with Miralax  13. H/o Depression: On Lexapro and Klonopin.  14. 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  Continue Stiolto. Will add Claritin and nose spray to help with nasal congestion.  Also added Mucinex DM to help with congested cough.  Check chest x-ray due to reports of  worsening. Schedule xopenex (since has tachycardia with albuterol) as needed for SOB/wheezing.  15.  Left ankle sprain: Will order ASO.  Ice tid as well as Voltaren gel to Achilles tendon to help with pain., PRAFO at noc 16. Sinus pain: As per RN, complaining of sinus pain and already receiving nasal spray and claritin. Meds reviewed and does not appear to be using nasal spray. Will add Mucinex 600mg  BID.   LOS: 4 days A FACE TO FACE EVALUATION WAS PERFORMED  Beth [name] Beth [name] 05/28/2019, 6:22 PM

## 2019-05-28 NOTE — Progress Notes (Signed)
Patient states after she had her am and pm nebulizer treatment, she experienced some dizziness and "buzzing" in her head. She states her am treatment "wiped her whole day away". After her pm treatment she states that the nurse gave tylenol and she felt some better. RT explained that she would try and speak with MD and figure out if nebs are still necessary. RT will continue to monitor.

## 2019-05-28 NOTE — Progress Notes (Signed)
Physical Therapy Session Note  Patient Details  Name: Beth [name] MRN: [id number] Date of Birth: Jun 08, 1940  Today's Date: 05/28/2019 PT Individual Time: 0806-0900 PT Individual Time Calculation (min): 54 min   Short Term Goals: Week 1:  PT Short Term Goal 1 (Week 1): Pt will ambulate x 50 ft with RW and min assist PT Short Term Goal 2 (Week 1): Pt will ascend/descend 4 steps with single rail and min assist PT Short Term Goal 3 (Week 1): Pt will perform bed<>chair transfer with CGA and LRAD  Skilled Therapeutic Interventions/Progress Updates:   Pt received asleep, supine in bed but awakens upon verbal stimulus. Pt agreeable to therapy session. Received and maintained in 2L of O2 via nasal cannula throughout session. Reports she had another breathing treatment last night resulting in a "buzzing/vibrating" feeling along the crown of her head as well as a "foggy brain" feeling that she isn't able to think clearly. Supine>sitting L EOB with min assist for trunk upright. Sitting EOB donned L soft ankle brace and B shoes max assist for time management. Sit<>stands using RW with min assist for lifting/balance - cuing for increased anterior trunk lean. Gait ~82ft to toilet using RW with CGA/min assist for balance and cuing for AD management over door threshold. Sit<>stands on/off toilet using grab bar with min assist. Continent of bladder and bowels. Seated peri-care with close supervision for trunk control and cuing for use of L UE involvement in grasping toilet paper. Sitting on toilet donned LB clothing with max assist for time management. Pt requiring seated rest break on toilet prior to ambulating ~64ft back to w/c using RW with min assist for balance and AD management down doorway threshold. Pt requiring seated rest break to "catch my breath" - SpO2 95%. RT present for breathing treatment and pt/therapist notified her of the symptoms pt has experience with prior 2 treatments. Therapist educated pt on  possible effects of CVA on breathing with potentially impaired trunk and respiratory muscle activation. Pt requesting to eat breakfast. Set-up with meal tray and left seated in w/c with needs in reach and seat belt alarm on. Reinforced education regarding elevating L LE throughout day for edema management - less edema noted today compared to yesterday.  Therapy Documentation Precautions:  Precautions Precautions: Fall Precaution Comments: Left inattention,  O2 dependent at home Restrictions Weight Bearing Restrictions: No  Pain:   Reports L ankle pain when coming to stand from toilet due to ankle positioned into DF during weightbearing.   Therapy/Group: Individual Therapy  Tawana Scale, PT, DPT 05/28/2019, 7:49 AM

## 2019-05-28 NOTE — Progress Notes (Signed)
Speech Language Pathology Daily Session Note  Patient Details  Name: Beth [name] MRN: [id number] Date of Birth: 11-09-1940  Today's Date: 05/28/2019 SLP Individual Time: 1310-1347 SLP Individual Time Calculation (min): 37 min  Short Term Goals: Week 1: SLP Short Term Goal 1 (Week 1): Pt will demonstrate ability to problem solve complex functional situations with Supervision A cues. SLP Short Term Goal 2 (Week 1): Pt will selectively attend to tasks with Supervision A cues for redirection. SLP Short Term Goal 3 (Week 1): Pt will recall new and/or daily information with Supervision A cues for use of strategies or aids.  Skilled Therapeutic Interventions:  Pt was seen for skilled ST targeting cognitive goals.  Session began late as pt had just started lunch upon therapist's arrival.  SLP facilitated the session with a medication management task to address problem solving goals.  Pt loaded her pills into a twice daily pill box with mod I for task organization and error awareness.   Pt was left in bed with bed alarm set and call bell within reach.  Continue per current plan of care.    Pain Pain Assessment Pain Scale: 0-10 Pain Score: 0-No pain  Therapy/Group: Individual Therapy  Tyrez Berrios, Selinda Orion 05/28/2019, 1:51 PM

## 2019-05-28 NOTE — Progress Notes (Signed)
Occupational Therapy Session Note  Patient Details  Name: Beth [name] MRN: [id number] Date of Birth: [birth year]  Today's Date: 05/28/2019 OT Individual Time: [id number] OT Individual Time Calculation (min): 94 min    Short Term Goals: Week 1:  OT Short Term Goal 1 (Week 1): Pt will complete UB dressing with supervision for pullover shirt for two consecutive sessions. OT Short Term Goal 2 (Week 1): Pt will complete LB dressing for donning pants with min assist following hemi dressing techniques for two consecutive sessions. OT Short Term Goal 3 (Week 1): Pt will complete toilet transfers with min assist using the RW for support. OT Short Term Goal 4 (Week 1): Pt will use the LUE as a stabilizer during selfcare tasks with supervision and no more than min instructional cueing for integration.  Skilled Therapeutic Interventions/Progress Updates:  Pt received supine in bed agreeable to OT intervention. Pt on 2L O2 throughout session with O2 WNL. Session focus on functional mobility and BADL retraining. Overall, pt required supervision for bed mobility with HOB elevated and use of bed rails and MIN A for functional transfers with RW. Pt complete seated grooming tasks at sink with supervision- set- up assist overall. Issued pt bathmit to assist with bathing. Pt appreciative of AE and required set- up assist to wash face at sink with LUE and hand over hand assist. Pt complete oral care at sink with set- up. Pt complete stand pivot transfer to toilet with no AD with use of grab bars with MINA. MIN A for toileting to doff clothes with pt able to complete anterior pericare with supervision. Pt complete shower transfer to walk in shower with MIN A with use of grab bars. MOD A overall for bathing with pt utilizing bathmit. Pt required cues throughout shower to incorporate LUE into ADLs. MIN A for dressing EOB to don new hospital gown. Pt left supine in bed with bed alarm activated and all needs within reach.    Therapy Documentation Precautions:  Precautions Precautions: Fall Precaution Comments: Left inattention,  O2 dependent at home Restrictions Weight Bearing Restrictions: No General:   Vital Signs: Therapy Vitals Temp: 97.8 F (36.6 C) Temp Source: Oral Pulse Rate: 75 Resp: 19 BP: (!) 114/100 Patient Position (if appropriate): Sitting Oxygen Therapy SpO2: 94 % O2 Device: Nasal Cannula O2 Flow Rate (L/min): 2 L/min Pain: Pt reports unrated pain in LUE; offered repositioning as pain mgmt strategy.   Therapy/Group: Individual Therapy  Ihor Gully 05/28/2019, 4:30 PM

## 2019-05-28 NOTE — Progress Notes (Signed)
Wore left PRAFO until 0400. +2 pitting edema to LLE, especially to top of left foot. Using BSC. [id number]'s C/O feeling dizzy after taking neb. Treatment. Will spot check orthostatic vitals this AM. C/O right ear pain. "I get dizzy when I have a sinus infection." 02 at 2L/M via [place]. Patrici Ranks A

## 2019-05-29 ENCOUNTER — Inpatient Hospital Stay (HOSPITAL_COMMUNITY): Payer: Medicare Other | Admitting: Physical Therapy

## 2019-05-29 ENCOUNTER — Inpatient Hospital Stay (HOSPITAL_COMMUNITY): Payer: Medicare Other

## 2019-05-29 MED ORDER — GABAPENTIN 600 MG PO TABS
300.0000 mg | ORAL_TABLET | Freq: Every day | ORAL | Status: DC
  Administered 2019-05-29 – 2019-06-05 (×8): 300 mg via ORAL
  Filled 2019-05-29 (×8): qty 1

## 2019-05-29 MED ORDER — HYDROCERIN EX CREA
TOPICAL_CREAM | Freq: Two times a day (BID) | CUTANEOUS | Status: DC
  Filled 2019-05-29 (×2): qty 113

## 2019-05-29 NOTE — Progress Notes (Signed)
Physical Therapy Session Note  Patient Details  Name: Beth [name] MRN: [id number] Date of Birth: [birth year]  Today's Date: 05/29/2019 PT Individual Time: [id number] PT Individual Time Calculation (min): 56 min   Short Term Goals: Week 1:  PT Short Term Goal 1 (Week 1): Pt will ambulate x 50 ft with RW and min assist PT Short Term Goal 2 (Week 1): Pt will ascend/descend 4 steps with single rail and min assist PT Short Term Goal 3 (Week 1): Pt will perform bed<>chair transfer with CGA and LRAD  Skilled Therapeutic Interventions/Progress Updates:    Pt seated in recliner upon PT arrival, agreeable to therapy tx and denies pain at rest. Therapist assisted to don L ankle brace, shoes and socks. Pt performed sit<>stand with RW and CGA, ambulated x 10 ft to the w/c with RW and min assist, cues for attention to R side/UE and RW management. Pt transported to the gym. Stand pivot w/c<>nustep this session with CGA. Pt used nustep with L hand attachment, using all extremities working on endurance and global strengthening, x 6 minutes on workload 5. Pt then worked on ambulation this session with RW for endurance and gait, cues throughout for attention to R UE - x 35 ft, x 40 ft and x 30 ft with RW and min assist. Pt uses bathroom in the dayroom this session, ambulates in/out of bathroom with RW and min assist, continent of bladder, assist with clothing management. Pt performed x 5 sit<>stands this session from chair with hands on knees working on LE strengthening. During rest breaks therapist adjusted w/c and added lap tray for better L UE positioning. Pt transported back to room at end of session and left in w/c with needs in reach and chair alarm set.   Therapy Documentation Precautions:  Precautions Precautions: Fall Precaution Comments: Left inattention,  O2 dependent at home Restrictions Weight Bearing Restrictions: No    Therapy/Group: Individual Therapy  Netta Corrigan, PT, DPT,  CSRS 05/29/2019, 1:04 PM

## 2019-05-29 NOTE — Progress Notes (Signed)
Slept good. LLE, especially top of foot with 2 plus pitting edema. BLE's elevated on pillows. O2 at 2L/m. Decreased C/O pain to right ear. "I'm starting to feeling better." Excited about getting hair washed. Left PRAFO applied at HS. Declined need for voltaren gel. Patrici Ranks A

## 2019-05-29 NOTE — Progress Notes (Signed)
Physical Therapy Session Note  Patient Details  Name: Beth [name] MRN: [id number] Date of Birth: 1940-02-19  Today's Date: 05/29/2019 PT Individual Time: 0810-0908 PT Individual Time Calculation (min): 58 min   Short Term Goals: Week 1:  PT Short Term Goal 1 (Week 1): Pt will ambulate x 50 ft with RW and min assist PT Short Term Goal 2 (Week 1): Pt will ascend/descend 4 steps with single rail and min assist PT Short Term Goal 3 (Week 1): Pt will perform bed<>chair transfer with CGA and LRAD  Skilled Therapeutic Interventions/Progress Updates:   Pt received supine in bed and agreeable to therapy session. Pt had completed breakfast. Supine>sitting L EOB, HOB elevated and using bedrails, with supervision. Sitting EOB donned L soft ankle brace and B shoes max assist. Sit<>stand using RW with CGA for steadying. Gait training ~69ft to toilet using RW with CGA for steadying and cuing for AD management over threshold. Sit<>stand on/off toilet using grab bar with CGA for safety. Continent of bladder - performed seated peri-care with supervision for safety. Donned LB clothing max assist for time management. Standing with CGA for steadying pulled up pants with min assist to incorporate L UE into task. Pt requires seated rest break on toilet to "catch breath" prior to ambulating ~ 59ft back to w/c using RW with CGA for steadying continued cuing for AD management over threshold. Performed UB dressing with mod assist and cuing for sequencing and increased incorporation of L UE in the task. Gait training ~40ft to recliner using RW with CGA for steadying - continues to require cuing for strapping L hand in/out of RW orthotic prior to sitting after ambulating with impaired attention to L hemibody. Pt left seated in recliner with needs in reach, LEs elevated for edema management, and chair alarm on.   Pt requires frequent seated rest breaks due to feeling SOB - SpO2 monitored and 92% or higher throughout  session.  Therapy Documentation Precautions:  Precautions Precautions: Fall Precaution Comments: Left inattention,  O2 dependent at home Restrictions Weight Bearing Restrictions: No  Pain: No reports of pain throughout session.   Therapy/Group: Individual Therapy  Tawana Scale, PT, DPT 05/29/2019, 7:48 AM

## 2019-05-29 NOTE — Progress Notes (Signed)
[place] PHYSICAL MEDICINE & REHABILITATION PROGRESS NOTE   Subjective/Complaints: Sinus pain better controlled. Has some left sided neuropathic pain and would like to try Gabapentin at night. Has som dry skin, would like to try eucerin  ROS- neg CP SOB N/V/D  Objective:   No results found. No results for input(s): WBC, HGB, HCT, PLT in the last 72 hours. No results for input(s): NA, K, CL, CO2, GLUCOSE, BUN, CREATININE, CALCIUM in the last 72 hours.  Intake/Output Summary (Last 24 hours) at 05/29/2019 1033 Last data filed at 05/29/2019 0852 Gross per 24 hour  Intake 882 ml  Output --  Net 882 ml     Physical Exam: Vital Signs Blood pressure (!) 128/59, pulse 90, temperature 97.9 F (36.6 C), temperature source Oral, resp. rate 19, height 5\' 4"  (1.626 m), weight 107.8 kg, last menstrual period 01/14/1995, SpO2 96 %. General: No acute distress, receiving patient care Mood and affect are appropriate Heart: Regular rate and rhythm no rubs murmurs or extra sounds Lungs: Clear to auscultation, breathing unlabored, no rales or wheezes Abdomen: Positive bowel sounds, soft nontender to palpation, nondistended Extremities: No clubbing, cyanosis, or edema Skin: No evidence of breakdown, no evidence of rash, dry skin 3/5 left delt bi tri grip, 2- HF, 3- KE trace ankle DF  Sensory exam normal sensation to light touch and proprioception in bilateral upper and lower extremities Cerebellar exam normal finger to nose to finger as well as heel to shin in bilateral upper and lower extremities Musculoskeletal: Full range of motion in all 4 extremities. No joint swelling   Assessment/Plan: 1. 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  Physiatrist is providing close team supervision and 24 hour management of active medical problems listed  below.  Physiatrist and rehab team continue to assess barriers to discharge/monitor patient progress toward functional and medical goals  Care Tool:  Bathing    Body parts bathed by patient: Left arm, Chest, Abdomen, Front perineal area, Right upper leg, Left upper leg, Face, Right lower leg   Body parts bathed by helper: Right arm     Bathing assist Assist Level: Moderate Assistance - Patient 50 - 74%     Upper Body Dressing/Undressing Upper body dressing   What is the patient wearing?: Hospital gown only    Upper body assist Assist Level: Minimal Assistance - Patient > 75%    Lower Body Dressing/Undressing Lower body dressing      What is the patient wearing?: Pants     Lower body assist Assist for lower body dressing: Minimal Assistance - Patient > 75%     Toileting Toileting    Toileting assist Assist for toileting: Minimal Assistance - Patient > 75%     Transfers Chair/bed transfer  Transfers assist     Chair/bed transfer assist level: Minimal Assistance - Patient > 75% Chair/bed transfer assistive device: Programmer, multimedia   Ambulation assist      Assist level: Minimal Assistance - Patient > 75% Assistive device: Walker-rolling Max distance: 19ft   Walk 10 feet activity   Assist     Assist level: Minimal Assistance - Patient > 75% Assistive device: Walker-rolling   Walk 50 feet activity   Assist Walk 50 feet with 2 turns activity did not occur: Safety/medical concerns(fatigue, poor endurance)         Walk 150 feet activity  Assist Walk 150 feet activity did not occur: Safety/medical concerns         Walk 10 feet on uneven surface  activity   Assist Walk 10 feet on uneven surfaces activity did not occur: Safety/medical concerns         Wheelchair     Assist Will patient use wheelchair at discharge?: No             Wheelchair 50 feet with 2 turns activity    Assist             Wheelchair 150 feet activity     Assist          Blood pressure (!) 128/59, pulse 90, temperature 97.9 F (36.6 C), temperature source Oral, resp. rate 19, height 5\' 4"  (1.626 m), weight 107.8 kg, last menstrual period 01/14/1995, SpO2 96 %.    Medical Problem List and Plan: 1.  Impaired function, mobility and ADLs with L hemiparesis secondary to R MCA stroke             -patient may  Shower-              -ELOS/Goals:DC 5/25 to VVS service for R ICA stenting Dr Donzetta Matters  2.  Antithrombotics: -DVT/anticoagulation:  Pharmaceutical: Other (comment)--Eliquis             -antiplatelet therapy: on DAPT 3. Pain Management: Continue Klonopin for "pain' and seizures.  5/16: start Gabapentin HS 300mg  for neuropathic pain on left side 4. Mood: LCSW to follow for evaluation and support.              -antipsychotic agents: N/A 5. Neuropsych: This patient is capable of making decisions on her own behalf. 6. Skin/Wound Care: Routine pressure measures. Eucerin for dry skin 7. Fluids/Electrolytes/Nutrition: Monitor I/O. Check lytes in am.  8. HTN: Monitor BP tid--continue Capoten, Cardizem and Maxzide.   5/15:  SBP low but diastolic high- continue to monitor.   5/16: BP stable Vitals:   05/28/19 2001 05/29/19 0504  BP: 132/73 (!) 128/59  Pulse: 84 90  Resp: 19   Temp: 97.8 F (36.6 C) 97.9 F (36.6 C)  SpO2: 97% 96%   9. Seizure: Has been controlled on Klonopin and Keppra bid.   10. Leucocytosis: Monitor for signs of infection.  11. New onset A fib: Monitor HR tid--on Cardizem and Eliquis - rate is up , systolic still elevated will increase cardizem , if this persists  5/15: rate controlled 12. Chronic constipation: Managed with Miralax  13. H/o Depression: On Lexapro and Klonopin.  14. 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  Continue Stiolto. Will add Claritin and nose spray to help with nasal congestion.  Also added Mucinex  DM to help with congested cough.  Check chest x-ray due to reports of worsening. Schedule xopenex (since has tachycardia with albuterol) as needed for SOB/wheezing.  15.  Left ankle sprain: Will order ASO.  Ice tid as well as Voltaren gel to Achilles tendon to help with pain., PRAFO at noc  5/16: discussed schedule for wearing brace. Continue to wear with PT and at rest as much as tolerated. Can remove when resting when feeling tight and ice in between.  16. Sinus pain: As per RN, complaining of sinus pain and already receiving nasal spray and claritin. Meds reviewed and does not appear to be using nasal spray. Will add Mucinex 600mg  BID.   5/16: much improved LOS: 5 days A FACE TO  FACE EVALUATION WAS PERFORMED  Izora Ribas 05/29/2019, 10:33 AM

## 2019-05-30 ENCOUNTER — Inpatient Hospital Stay (HOSPITAL_COMMUNITY): Payer: Medicare Other

## 2019-05-30 ENCOUNTER — Ambulatory Visit: Payer: Medicare Other | Admitting: Oncology

## 2019-05-30 ENCOUNTER — Inpatient Hospital Stay (HOSPITAL_COMMUNITY): Payer: Medicare Other | Admitting: Occupational Therapy

## 2019-05-30 DIAGNOSIS — J449 Chronic obstructive pulmonary disease, unspecified: Secondary | ICD-10-CM

## 2019-05-30 LAB — CBC
HCT: 36.1 % (ref 36.0–46.0)
Hemoglobin: 12.5 g/dL (ref 12.0–15.0)
MCH: 33.3 pg (ref 26.0–34.0)
MCHC: 34.6 g/dL (ref 30.0–36.0)
MCV: 96.3 fL (ref 80.0–100.0)
Platelets: 289 10*3/uL (ref 150–400)
RBC: 3.75 MIL/uL — ABNORMAL LOW (ref 3.87–5.11)
RDW: 12.3 % (ref 11.5–15.5)
WBC: 9.8 10*3/uL (ref 4.0–10.5)
nRBC: 0 % (ref 0.0–0.2)

## 2019-05-30 LAB — BASIC METABOLIC PANEL
Anion gap: 10 (ref 5–15)
BUN: 9 mg/dL (ref 8–23)
CO2: 28 mmol/L (ref 22–32)
Calcium: 8.9 mg/dL (ref 8.9–10.3)
Chloride: 92 mmol/L — ABNORMAL LOW (ref 98–111)
Creatinine, Ser: 0.67 mg/dL (ref 0.44–1.00)
GFR calc Af Amer: >60 mL/min (ref >60)
GFR calc non Af Amer: >60 mL/min (ref >60)
Glucose, Bld: 129 mg/dL — ABNORMAL HIGH (ref 70–99)
Potassium: 3.2 mmol/L — ABNORMAL LOW (ref 3.5–5.1)
Sodium: 130 mmol/L — ABNORMAL LOW (ref 135–145)

## 2019-05-30 MED ORDER — POTASSIUM CHLORIDE CRYS ER 20 MEQ PO TBCR
20.0000 meq | EXTENDED_RELEASE_TABLET | Freq: Two times a day (BID) | ORAL | Status: DC
  Administered 2019-05-30 – 2019-06-03 (×8): 20 meq via ORAL
  Filled 2019-05-30 (×8): qty 1

## 2019-05-30 MED ORDER — POTASSIUM CHLORIDE CRYS ER 20 MEQ PO TBCR
20.0000 meq | EXTENDED_RELEASE_TABLET | Freq: Once | ORAL | Status: AC
  Administered 2019-05-30: 20 meq via ORAL
  Filled 2019-05-30: qty 1

## 2019-05-30 NOTE — Progress Notes (Signed)
Physical Therapy Session Note  Patient Details  Name: Beth [name] MRN: [id number] Date of Birth: 07/14/1940  Today's Date: 05/30/2019 PT Individual Time: 1118-1200 and 1530-1640 PT Individual Time Calculation (min): 42 min and 70 min     Short Term Goals: Week 1:  PT Short Term Goal 1 (Week 1): Pt will ambulate x 50 ft with RW and min assist PT Short Term Goal 2 (Week 1): Pt will ascend/descend 4 steps with single rail and min assist PT Short Term Goal 3 (Week 1): Pt will perform bed<>chair transfer with CGA and LRAD  Skilled Therapeutic Interventions/Progress Updates:    Session 1: Pt seated in w/c upon PT arrival, agreeable to therapy tx and denies pain, does report some muscle ache/soreness from therapy yesterday. Therapist assisted to don shoes total assist for time management. Pt transported to the gym. Pt ambulated 2 x 40 ft this session with RW and CGA, cues for attention to L side and functional use of L UE, cues for increased step length. Pt worked on standing balance and L UE functional use to perform horseshoe toss activity x 2 trials without UE support and CGA for balance. Pt continued to work on standing balance and L UE coordination this session to perform clothespin grasping task while standing, cues for focus/attention to task and decreased speed to improve accuracy. Pt ambulated x 50 ft back to her w/c with RW and CGA, transported back to room. Stand pivot w/c<>toilet with CGA, continent of bladder, supervision for pericare, min assist for clothing management for L side. Pt left in w/c at end of session, needs in reach and chair alarm set.   Session 2: Pt seated in w/c upon PT arrival, agreeable to therapy tx and denies pain. Pt transported to the gym. Stand pivot w/c<>nustep this session with CGA. Pt used nustep this session x 8 minutes on workload 4 for global strengthening and overall endurance- hand attachment used to incorporate and use L UE. Pt ambulated x 50 ft to the w/c  with RW and CGA, transported to the gym. Worked on dynamic balance and gait without UE support this session to perform the following tasks - x32 ft ambulation without AD min assist, toe taps on aerobic step min-mod assist x 2 trials, sidestepping in each direction 4 x 8 ft, and ambulation x 44 ft without AD min assist. During gait without AD pt does maintain short step length and wide BOS. Pt transported back to room, ambulated from w/c>toilet>recliner this session without AD and with min assist, cues for safety and assist for O2 tank management. Pt continent of bladder, min assist with clothing management on L side, performs pericare without assist. Pt transfers to recliner and left with needs in reach , legs elevated and shoes/socks doffed.    Therapy Documentation Precautions:  Precautions Precautions: Fall Precaution Comments: Left inattention,  O2 dependent at home Restrictions Weight Bearing Restrictions: No    Therapy/Group: Individual Therapy  Netta Corrigan, PT, DPT, CSRS 05/30/2019, 7:50 AM

## 2019-05-30 NOTE — Progress Notes (Signed)
Occupational Therapy Session Note  Patient Details  Name: Beth [name] MRN: [id number] Date of Birth: 1940/08/16  Today's Date: 05/30/2019 OT Individual Time: [id number] Individual Time Calculation (min): 46 min    Short Term Goals: Week 1:  OT Short Term Goal 1 (Week 1): Pt will complete UB dressing with supervision for pullover shirt for two consecutive sessions. OT Short Term Goal 2 (Week 1): Pt will complete LB dressing for donning pants with min assist following hemi dressing techniques for two consecutive sessions. OT Short Term Goal 3 (Week 1): Pt will complete toilet transfers with min assist using the RW for support. OT Short Term Goal 4 (Week 1): Pt will use the LUE as a stabilizer during selfcare tasks with supervision and no more than min instructional cueing for integration.  Skilled Therapeutic Interventions/Progress Updates:    Pt completed supine to sit EOB with min assist in order to work on dressing tasks.  She was able to donn her bra after fastening it, with only min assist to pull it down in the back.  She needed assist with orientation of it however and two attempts to get it on.  She then donned her pullover shirt following hemi technique with supervision.  Pants were donned sit to stand with min assist as well as mod assist for gripper socks.  Left ankle orthosis required total assist to apply.  Pt finished session with transfer to the wheelchair with min assist using the RW for support.  Pt left in the chair with call button and phone in reach and safety belt in place.  Pt's O2 sats while sitting during session on room air were at 90%, but pt did exhibit some labored breathing at 2/4 on the dyspnea scale.  Therapy Documentation Precautions:  Precautions Precautions: Fall Precaution Comments: Left inattention,  O2 dependent at home Restrictions Weight Bearing Restrictions: No  Pain: Pain Assessment Pain Scale: Faces Pain Score: 0-No pain ADL: See Care Tool  Section for some details of mobility and selfcare  Therapy/Group: Individual Therapy  Bernell Sigal OTR/L 05/30/2019, 12:18 PM

## 2019-05-30 NOTE — Progress Notes (Addendum)
[place] PHYSICAL MEDICINE & REHABILITATION PROGRESS NOTE   Subjective/Complaints: Seems to have had a good night. Left arm improving. Pain seemed better last night   ROS: Patient denies fever, rash, sore throat, blurred vision, nausea, vomiting, diarrhea, cough, shortness of breath or chest pain,   headache, or mood change.   Objective:   No results found. Recent Labs    05/30/19 0540  WBC 9.8  HGB 12.5  HCT 36.1  PLT 289   Recent Labs    05/30/19 0540  NA 130*  K 3.2*  CL 92*  CO2 28  GLUCOSE 129*  BUN 9  CREATININE 0.67  CALCIUM 8.9    Intake/Output Summary (Last 24 hours) at 05/30/2019 1354 Last data filed at 05/30/2019 1300 Gross per 24 hour  Intake 680 ml  Output --  Net 680 ml     Physical Exam: Vital Signs Blood pressure 134/79, pulse 83, temperature 97.8 F (36.6 C), resp. rate 19, height 5\' 4"  (1.626 m), weight 107.8 kg, last menstrual period 01/14/1995, SpO2 95 %. Constitutional: No distress . Vital signs reviewed. HEENT: EOMI, oral membranes moist Neck: supple Cardiovascular: RRR without murmur. No JVD    Respiratory/Chest: CTA Bilaterally without wheezes or rales. Normal effort    GI/Abdomen: BS +, non-tender, non-distended Ext: no clubbing, cyanosis, or edema Psych: pleasant and cooperative Skin: No evidence of breakdown, no evidence of rash, dry skin   3- to 3/5 left delt bi tri grip, 3- HF, 3-to 3/5 KE, 2+ DF  Sensory exam normal sensation to light touch and proprioception in bilateral  lower extremities. Sl decreased LUE.  Cerebellar exam normal finger to nose to finger as well as heel to shin in bilateral upper and lower extremities Musculoskeletal: Full range of motion in all 4 extremities. No joint swelling   Assessment/Plan: 1. 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  Physiatrist is  providing close team supervision and 24 hour management of active medical problems listed below.  Physiatrist and rehab team continue to assess barriers to discharge/monitor patient progress toward functional and medical goals  Care Tool:  Bathing    Body parts bathed by patient: Left arm, Chest, Abdomen, Front perineal area, Right upper leg, Left upper leg, Face, Right lower leg   Body parts bathed by helper: Right arm     Bathing assist Assist Level: Moderate Assistance - Patient 50 - 74%     Upper Body Dressing/Undressing Upper body dressing   What is the patient wearing?: Bra, Pull over shirt    Upper body assist Assist Level: Minimal Assistance - Patient > 75%    Lower Body Dressing/Undressing Lower body dressing      What is the patient wearing?: Pants     Lower body assist Assist for lower body dressing: Minimal Assistance - Patient > 75%     Toileting Toileting    Toileting assist Assist for toileting: Minimal Assistance - Patient > 75%     Transfers Chair/bed transfer  Transfers assist     Chair/bed transfer assist level: Contact Guard/Touching assist Chair/bed transfer assistive device: Programmer, multimedia   Ambulation assist      Assist level: Contact Guard/Touching assist Assistive device: Walker-rolling Max distance: 40 ft   Walk 10 feet activity   Assist     Assist level: Contact Guard/Touching assist Assistive device: Walker-rolling   Walk  50 feet activity   Assist Walk 50 feet with 2 turns activity did not occur: Safety/medical concerns(fatigue, poor endurance)         Walk 150 feet activity   Assist Walk 150 feet activity did not occur: Safety/medical concerns         Walk 10 feet on uneven surface  activity   Assist Walk 10 feet on uneven surfaces activity did not occur: Safety/medical concerns         Wheelchair     Assist Will patient use wheelchair at discharge?: No              Wheelchair 50 feet with 2 turns activity    Assist            Wheelchair 150 feet activity     Assist          Blood pressure 134/79, pulse 83, temperature 97.8 F (36.6 C), resp. rate 19, height 5\' 4"  (1.626 m), weight 107.8 kg, last menstrual period 01/14/1995, SpO2 95 %.    Medical Problem List and Plan: 1.  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 Will ask SLP to investigate.              -ELOS/Goals:DC 5/25 to VVS service for R ICA stenting Dr Donzetta Matters  2.  Antithrombotics: -DVT/anticoagulation:  Pharmaceutical: Other (comment)--Eliquis             -antiplatelet therapy: on DAPT 3. Pain Management: Continue Klonopin for "pain' and seizures.  5/16: started Gabapentin HS 300mg  for neuropathic pain on left side--seems to have helped --5/17 4. Mood: LCSW to follow for evaluation and support.              -antipsychotic agents: N/A 5. Neuropsych: This patient is capable of making decisions on her own behalf. 6. Skin/Wound Care: Routine pressure measures. Eucerin for dry skin 7. Fluids/Electrolytes/Nutrition: Monitor I/O. Check lytes in am.  8. HTN: Monitor BP tid--continue Capoten, Cardizem and Maxzide.   5/15:  SBP low but diastolic high- continue to monitor.   5/17: BP stable Vitals:   05/30/19 0615 05/30/19 1318  BP: 121/78 134/79  Pulse: 70 83  Resp: 20 19  Temp: (!) 97.5 F (36.4 C) 97.8 F (36.6 C)  SpO2: 97% 95%   9. Seizure: Has been controlled on Klonopin and Keppra bid.   10. Leucocytosis: Monitor for signs of infection.  11. New onset A fib: Monitor HR tid--on Cardizem and Eliquis - rate is up , systolic still elevated will increase cardizem , if this persists  5/17: rate/bp controlled 12. Chronic constipation: Managed with Miralax  13. H/o Depression: On Lexapro and Klonopin.  14. COPD with chronic  respiratory failure: Oxygen dependent--add humidity is reporting some nosebleeds.  Continue Stiolto. Will add Claritin and nose spray to help with nasal congestion.  Also added Mucinex DM to help with congested cough.  Check chest x-ray due to reports of worsening. Schedule xopenex (since has tachycardia with albuterol) as needed for SOB/wheezing.  15.  Left ankle sprain: Will order ASO.  Ice tid as well as Voltaren gel to Achilles tendon to help with pain., PRAFO at noc  5/16: discussed schedule for wearing brace. Continue to wear with PT and at rest as much as tolerated. Can remove when resting when feeling  tight and ice in between.  16. Sinus pain: As per RN, complaining of sinus pain and already receiving nasal spray and claritin. Meds reviewed and does not appear to be using nasal spray. Will add Mucinex 600mg  BID.   5/16-17: much improved   LOS: 6 days A FACE TO [place] 05/30/2019, 1:54 PM

## 2019-05-31 ENCOUNTER — Inpatient Hospital Stay (HOSPITAL_COMMUNITY): Payer: Medicare Other | Admitting: Occupational Therapy

## 2019-05-31 ENCOUNTER — Inpatient Hospital Stay (HOSPITAL_COMMUNITY): Payer: Medicare Other | Admitting: Physical Therapy

## 2019-05-31 ENCOUNTER — Inpatient Hospital Stay (HOSPITAL_COMMUNITY): Payer: Medicare Other

## 2019-05-31 NOTE — Progress Notes (Signed)
Occupational Therapy Session Note  Patient Details  Name: Beth [name] MRN: [id number] Date of Birth: 1940/07/16  Today's Date: 05/31/2019 OT Individual Time: 1001-1105 OT Individual Time Calculation (min): 64 min    Short Term Goals: Week 1:  OT Short Term Goal 1 (Week 1): Pt will complete UB dressing with supervision for pullover shirt for two consecutive sessions. OT Short Term Goal 2 (Week 1): Pt will complete LB dressing for donning pants with min assist following hemi dressing techniques for two consecutive sessions. OT Short Term Goal 3 (Week 1): Pt will complete toilet transfers with min assist using the RW for support. OT Short Term Goal 4 (Week 1): Pt will use the LUE as a stabilizer during selfcare tasks with supervision and no more than min instructional cueing for integration.  Skilled Therapeutic Interventions/Progress Updates:    Session 1:  (0901-1000) Pt in wheelchair at the sink for grooming tasks with setup.  She was able to complete combing her hair and brushing her teeth with supervision.  She utilized the LUE as a gross assist for stabilizing the toothpaste while opening it with the right.  Next, therapist took her down to the therapy gym where the focus of the rest of the session was on walk-in shower transfers.  She was able to complete simulated walk-in shower transfers with min assist using the RW and the shower seat.  Had pt step in backwards to get into the shower and then step out forward to transfer out.  Discussed use of a shower seat and pt will need to purchase a new one.  Therapist suggested using the 3:1 in the shower as well as this is usually covered by insurance.  She will discuss options with her spouse and also decide if the walk-in shower will be the easiest option vs using a tub bench in the tub.  Therapist also provided padding to her left hand walker splint secondary to it being too uncomfortable.  She reported it feeling better.  Pt returned to the room  and pt was left in the wheelchair with call button and phone in reach with safety belt in place.    Session 2: (1420-1505) Pt completed transfer from the bathroom to the wheelchair to start session with min assist using the RW for support.  After sterilizing her hands, she was pushed down to the therapy gym via wheelchair with supervision.  Beth [name] then completed transfer stand pivot to the therapy mat.  Had her work on LUE coordination and use by working on picking up and placing foam blocks 2"x3" in a container at chest level.  She also worked on manipulating them to stand them on one end.  Emphasis on maintaining visual contact on the target at all times as she lacks sensory input.  Finished session with transfer back to the wheelchair with mod instructional cueing for hand placement with sit to stand.  She was taken back to the room where she transferred to the bed with min assist and sat in preparation for SLP.  Bed alarm in place with nursing at the edge of the room to observe for safety.    Therapy Documentation Precautions:  Precautions Precautions: Fall Precaution Comments: Left inattention,  O2 dependent at home Restrictions Weight Bearing Restrictions: No   Pain: Pain Assessment Pain Scale: Faces Pain Score: 0-No pain ADL: See Care Tool Section for some details of mobility and selfcare  Therapy/Group: Individual Therapy  Beth [name] OTR/L 05/31/2019, 12:23 PM

## 2019-05-31 NOTE — Progress Notes (Signed)
Speech Language Pathology Daily Session Note  Patient Details  Name: FAATIMAH YOSS MRN: [id number] Date of Birth: 1940-03-11  Today's Date: 05/31/2019 SLP Individual Time: [id number] SLP Individual Time Calculation (min): 28 min  Short Term Goals: Week 1: SLP Short Term Goal 1 (Week 1): Pt will demonstrate ability to problem solve complex functional situations with Supervision A cues. SLP Short Term Goal 2 (Week 1): Pt will selectively attend to tasks with Supervision A cues for redirection. SLP Short Term Goal 3 (Week 1): Pt will recall new and/or daily information with Supervision A cues for use of strategies or aids.  Skilled Therapeutic Interventions: Skilled ST services focused on cognitive skills. SLP facilitated mildly complex problem solving skills in BID medication management task, pt demonstrated mod I for problem solving and required supervision A verbal cues for recall of medication name/function/times per day. Pt was left in room with call bell within reach and bed alarm set. ST recommends to continue skilled ST services.      Pain Pain Assessment Pain Scale: Faces Pain Score: 0-No pain  Therapy/Group: Individual Therapy  Tu Bayle  [facility] 05/31/2019, 3:50 PM

## 2019-05-31 NOTE — Progress Notes (Signed)
[facility] PHYSICAL MEDICINE & REHABILITATION PROGRESS NOTE   Subjective/Complaints: Seems to have had a good night. Left arm improving. Pain seemed better last night. Sinus issues have resolved. Has been wearing ankle brace night and day.   ROS: Patient denies fever, rash, sore throat, blurred vision, nausea, vomiting, diarrhea, cough, shortness of breath or chest pain,   headache, or mood change.   Objective:   No results found. Recent Labs    05/30/19 0540  WBC 9.8  HGB 12.5  HCT 36.1  PLT 289   Recent Labs    05/30/19 0540  NA 130*  K 3.2*  CL 92*  CO2 28  GLUCOSE 129*  BUN 9  CREATININE 0.67  CALCIUM 8.9    Intake/Output Summary (Last 24 hours) at 05/31/2019 1258 Last data filed at 05/31/2019 0800 Gross per 24 hour  Intake 660 ml  Output --  Net 660 ml     Physical Exam: Vital Signs Blood pressure 128/78, pulse 63, temperature 98 F (36.7 C), resp. rate 19, height 5\' 4"  (1.626 m), weight 107.8 kg, last menstrual period 01/14/1995, SpO2 97 %. Constitutional: No distress . Vital signs reviewed. HEENT: EOMI, oral membranes moist Neck: supple Cardiovascular: RRR without murmur. No JVD    Respiratory/Chest: CTA Bilaterally without wheezes or rales. Normal effort    GI/Abdomen: BS +, non-tender, non-distended Ext: no clubbing, cyanosis, or edema Psych: pleasant and cooperative Skin: No evidence of breakdown, no evidence of rash, dry skin   3- to 3/5 left delt bi tri grip, 3- HF, 3-to 3/5 KE, 2+ DF  Sensory exam normal sensation to light touch and proprioception in bilateral  lower extremities. Sl decreased LUE.  Cerebellar exam normal finger to nose to finger as well as heel to shin in bilateral upper and lower extremities Musculoskeletal: Full range of motion in all 4 extremities. Left ankle swelling improving  Assessment/Plan: 1. 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  Physiatrist is providing close team supervision and 24 hour management of active medical problems listed below.  Physiatrist and rehab team continue to assess barriers to discharge/monitor patient progress toward functional and medical goals  Care Tool:  Bathing    Body parts bathed by patient: Left arm, Chest, Abdomen, Front perineal area, Right upper leg, Left upper leg, Face, Right lower leg   Body parts bathed by helper: Right arm     Bathing assist Assist Level: Moderate Assistance - Patient 50 - 74%     Upper Body Dressing/Undressing Upper body dressing   What is the patient wearing?: Bra, Pull over shirt    Upper body assist Assist Level: Minimal Assistance - Patient > 75%    Lower Body Dressing/Undressing Lower body dressing      What is the patient wearing?: Pants     Lower body assist Assist for lower body dressing: Minimal Assistance - Patient > 75%     Toileting Toileting    Toileting assist Assist for toileting: Minimal Assistance - Patient > 75%     Transfers Chair/bed transfer  Transfers assist     Chair/bed transfer assist level: Contact Guard/Touching assist Chair/bed transfer assistive device: Programmer, multimedia   Ambulation assist      Assist level: Minimal Assistance - Patient > 75% Assistive device: Walker-rolling Max distance: 40 ft   Walk 10 feet activity   Assist  Assist level: Contact Guard/Touching assist Assistive device: Walker-rolling   Walk 50 feet activity   Assist Walk 50 feet with 2 turns activity did not occur: Safety/medical concerns(fatigue, poor endurance)         Walk 150 feet activity   Assist Walk 150 feet activity did not occur: Safety/medical concerns         Walk 10 feet on uneven surface  activity   Assist Walk 10 feet on uneven surfaces activity did not occur: Safety/medical concerns          Wheelchair     Assist Will patient use wheelchair at discharge?: No             Wheelchair 50 feet with 2 turns activity    Assist            Wheelchair 150 feet activity     Assist          Blood pressure 128/78, pulse 63, temperature 98 F (36.7 C), resp. rate 19, height 5\' 4"  (1.626 m), weight 107.8 kg, last menstrual period 01/14/1995, SpO2 97 %.    Medical Problem List and Plan: 1.  Impaired function, mobility and ADLs with L hemiparesis secondary to R MCA stroke             -patient may shower  -pt expressed some difficulties with solids such as breads. Will ask SLP to investigate.              -ELOS/Goals:DC 5/25 to VVS service for R ICA stenting Dr Donzetta Matters   -Continue CIR PT, OT 2.  Antithrombotics: -DVT/anticoagulation:  Pharmaceutical: Other (comment)--Eliquis             -antiplatelet therapy: on DAPT 3. Pain Management: Continue Klonopin for "pain' and seizures.  5/16: started Gabapentin HS 300mg  for neuropathic pain on left side--seems to have helped --5/17 4. Mood: LCSW to follow for evaluation and support.              -antipsychotic agents: N/A 5. Neuropsych: This patient is capable of making decisions on her own behalf. 6. Skin/Wound Care: Routine pressure measures. Eucerin for dry skin 7. Fluids/Electrolytes/Nutrition: Monitor I/O. Check lytes in am.  8. HTN: Monitor BP tid--continue Capoten, Cardizem and Maxzide.   5/15:  SBP low but diastolic high- continue to monitor.   5/17: BP stable  5/18: well controlled Vitals:   05/30/19 2006 05/31/19 0552  BP: (!) 150/84 128/78  Pulse: 79 63  Resp: 18 19  Temp: 97.8 F (36.6 C) 98 F (36.7 C)  SpO2: 94% 97%   9. Seizure: Has been controlled on Klonopin and Keppra bid.   10. Leucocytosis: Monitor for signs of infection.  11. New onset A fib: Monitor HR tid--on Cardizem and Eliquis - rate is up , systolic still elevated will increase cardizem , if this persists  5/18: rate  controlled 12. Chronic constipation: Managed with Miralax  13. H/o Depression: On Lexapro and Klonopin.  14. 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  Continue Stiolto. Will add Claritin and nose spray to help with nasal congestion.  Also added Mucinex DM to help with congested cough.  Check chest x-ray due to reports of worsening. Schedule xopenex (since has tachycardia with albuterol) as needed for SOB/wheezing.  15.  Left ankle sprain: Will order ASO.  Ice tid as well as Voltaren gel to Achilles tendon to help with pain., PRAFO at noc  5/16: discussed schedule for wearing brace.  Continue to wear with PT and at rest as much as tolerated. Can remove when resting when feeling tight and ice in between.   5/18: Encouraged icing more often 16. Sinus pain: As per RN, complaining of sinus pain and already receiving nasal spray and claritin. Meds reviewed and does not appear to be using nasal spray. Will add Mucinex 600mg  BID.   5/16-17: much improved   LOS: 7 days A FACE TO FACE EVALUATION WAS PERFORMED  Clide Deutscher Sarath Privott 05/31/2019, 12:58 PM

## 2019-05-31 NOTE — Progress Notes (Signed)
Physical Therapy Session Note  Patient Details  Name: Beth [name] MRN: [id number] Date of Birth: [birth year]  Today's Date: 05/31/2019 PT Individual Time: 0905-1001 PT Individual Time Calculation (min): 56 min   Short Term Goals: Week 1:  PT Short Term Goal 1 (Week 1): Pt will ambulate x 50 ft with RW and min assist PT Short Term Goal 2 (Week 1): Pt will ascend/descend 4 steps with single rail and min assist PT Short Term Goal 3 (Week 1): Pt will perform bed<>chair transfer with CGA and LRAD  Skilled Therapeutic Interventions/Progress Updates:   Pt received supine in bed and agreeable to therapy session. Pt received and maintained on 2L of O2 via nasal cannula during session. Supine>sitting L EOB, HOB elevated, with supervision and increased time. Sitting EOB donned B shoes and L ankle brace max assist for time management. Sit<>stands using RW with CGA for steadying. Gait training ~73ft to toilet using RW with CGA for steadying - cuing for AD management over door threshold. Standing with CGA performed LB clothing management with increased time and min assist for L side. Continent of bladder - seated peri-care with set-up assist. Sit<>stand toilet<>RW/grab bar with CGA for steadying. Upon initiating gait back out to room pt had L posterior LOB requiring min/mod assist to recover - cuing for smaller turns with AD and stepping LEs. Gait ~84ft back to w/c using RW with CGA/min assist and cuing for AD management and L UE placement on RW orthotic (would slip off if pt moved AD incorectly). Requires seated rest break and pt requesting increase to 3L of O2 via nasal cannula to recovery breathing. Performed UB dressing with mod assist and max cuing and manual facilitation for increased L UE incorporation into task. Seated LB clothing management with max assist - standing pulled pants over hips with min assist and cuing/manual facilitation for L UE incorporation. Standing at sink oral care with CGA for  steadying. Pt left seated in w/c as hand-off to [name].   Therapy Documentation Precautions:  Precautions Precautions: Fall Precaution Comments: Left inattention,  O2 dependent at home Restrictions Weight Bearing Restrictions: No  Pain:  Reports some "soreness" from therapy yesterday and pt states "old lady achiness" but denies other pain.    Therapy/Group: Individual Therapy  Tawana Scale, PT, DPT 05/31/2019, 7:52 AM

## 2019-06-01 ENCOUNTER — Inpatient Hospital Stay (HOSPITAL_COMMUNITY): Payer: Medicare Other

## 2019-06-01 ENCOUNTER — Inpatient Hospital Stay (HOSPITAL_COMMUNITY): Payer: Medicare Other | Admitting: Physical Therapy

## 2019-06-01 ENCOUNTER — Inpatient Hospital Stay (HOSPITAL_COMMUNITY): Payer: Medicare Other | Admitting: Occupational Therapy

## 2019-06-01 ENCOUNTER — Inpatient Hospital Stay (HOSPITAL_COMMUNITY): Payer: Medicare Other | Admitting: Speech Pathology

## 2019-06-01 NOTE — Progress Notes (Signed)
Occupational Therapy Weekly Progress Note  Patient Details  Name: Beth [name] MRN: [id number] Date of Birth: May 02, 1940  Beginning of progress report period: May 24, 2019 End of progress report period: Jun 01, 2019  Today's Date: 06/01/2019 OT Individual Time: 0900-1015 OT Individual Time Calculation (min): 75 min    Patient has met 4 of 4 short term goals.  Beth [name] is making steady progress with OT at this time.  She is completing UB dressing including donning a sports type bra with min assist.  She was able to complete LB dressing with min assist as well sit to stand.  She demonstrates slight difficulty donning socks and is not able to successfully integrate the LUE for this task secondary to sensory and coordination issues.  She is able to use the LUE at a gross assist level for holding objects to be opened as well as being able to use the UE to assist with pulling her shirt sleeve up her arm.  Decreased proprioception and light touch impact her ability to determine how tight she is gripping something as well.  Transfers are currently being performed with use of the RW and a hand splint attachment on the left side.  She is able to complete transfers to the toilet and the walk-in shower with min guard assist.  She does need min to mod instructional cueing for hand placement with sit to stand as well as she tends to want to pull up on the walker at times.  Oxygen sats are remaining greater than 95% on 2Ls nasal cannula with activity.  When it is removed her sats are still around 90% but pt she will exhibit some dyspnea at 2/4 and some anxiety requiring it to be used.  Feel she is on target to meet supervision level goals for home.  Her spouse will be able to provide this with expected discharge later this week if therapy team and MD agree.  Will continue with current OT POC until expected discharge.    Patient continues to demonstrate the following deficits: muscle weakness, decreased  cardiorespiratoy endurance, unbalanced muscle activation and decreased coordination, decreased attention to left, decreased memory and decreased standing balance, hemiplegia and decreased balance strategies and therefore will continue to benefit from skilled OT intervention to enhance overall performance with BADL, iADL and Reduce care partner burden.  Patient progressing toward long term goals..  Continue plan of care.  OT Short Term Goals Week 2:  OT Short Term Goal 1 (Week 2): Continue working on established LTGs set at supervision level.  Skilled Therapeutic Interventions/Progress Updates:    Pt worked on shower and dressing during session.  She was able to complete transfer into the shower with use of the RW for support.  She removed clothing sit to stand with min assist.  Mod instructional cueing to sustain attention on showering task and for organization.  Incorporated use of the wash mit with the left hand throughout session with overall min assist after therapist applied it.  She was able to complete LB bathing with min guard assist sit to stand.  She transferred out to the sink for dressing tasks.  Mod instructional cueing for following hemi dressing techniques, with min assist needed for pulling up pants on the left side.  Therapist assisted with donning pullover shirt and gripper socks secondary to time limitations.  Mod assist was needed for donning sports bra as well.  Finished session with spouse in the room and pt sitting at the sink  to finish grooming tasks.    Therapy Documentation Precautions:  Precautions Precautions: Fall Precaution Comments: Left inattention,  O2 dependent at home Restrictions Weight Bearing Restrictions: No  Pain: Pain Assessment Pain Scale: Faces Pain Score: 0-No pain ADL: See Care Tool Section for some details of mobility and selfcare  Therapy/Group: Individual Therapy  Beth [name] OTR/L 06/01/2019, 10:39 AM

## 2019-06-01 NOTE — Progress Notes (Signed)
[facility] PHYSICAL MEDICINE & REHABILITATION PROGRESS NOTE   Subjective/Complaints:  Slept ok Good shower Feels like LUE sensation improving    ROS: Patient denies CP, SOB, N/V/D Objective:   No results found. Recent Labs    05/30/19 0540  WBC 9.8  HGB 12.5  HCT 36.1  PLT 289   Recent Labs    05/30/19 0540  NA 130*  K 3.2*  CL 92*  CO2 28  GLUCOSE 129*  BUN 9  CREATININE 0.67  CALCIUM 8.9    Intake/Output Summary (Last 24 hours) at 06/01/2019 0914 Last data filed at 05/31/2019 2015 Gross per 24 hour  Intake 840 ml  Output --  Net 840 ml     Physical Exam: Vital Signs Blood pressure 123/65, pulse 77, temperature 97.6 F (36.4 C), resp. rate 20, height 5\' 4"  (1.626 m), weight 107.8 kg, last menstrual period 01/14/1995, SpO2 96 %.  General: No acute distress Mood and affect are appropriate Heart: Regular rate and rhythm no rubs murmurs or extra sounds Lungs: Clear to auscultation, breathing unlabored, no rales or wheezes Abdomen: Positive bowel sounds, soft nontender to palpation, nondistended Extremities: No clubbing, cyanosis, or edema Skin: No evidence of breakdown, no evidence of rash     3- to 3/5 left delt bi tri grip, 3- HF, 3-to 3/5 KE, 2+ DF  Sensory exam normal sensation to light touch and proprioception in bilateral  lower extremities. Sl decreased LUE.  Cerebellar exam normal finger to nose to finger as well as heel to shin in bilateral upper and lower extremities Musculoskeletal: Full range of motion in all 4 extremities. No joint swelling   Assessment/Plan: 1. 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  Physiatrist is providing close team supervision and 24 hour management of active medical problems listed below.  Physiatrist and rehab team continue to assess barriers to discharge/monitor  patient progress toward functional and medical goals  Care Tool:  Bathing    Body parts bathed by patient: Left arm, Chest, Abdomen, Front perineal area, Right upper leg, Left upper leg, Face, Right lower leg   Body parts bathed by helper: Right arm     Bathing assist Assist Level: Moderate Assistance - Patient 50 - 74%     Upper Body Dressing/Undressing Upper body dressing   What is the patient wearing?: Bra, Pull over shirt    Upper body assist Assist Level: Minimal Assistance - Patient > 75%    Lower Body Dressing/Undressing Lower body dressing      What is the patient wearing?: Pants     Lower body assist Assist for lower body dressing: Minimal Assistance - Patient > 75%     Toileting Toileting    Toileting assist Assist for toileting: Minimal Assistance - Patient > 75%     Transfers Chair/bed transfer  Transfers assist     Chair/bed transfer assist level: Contact Guard/Touching assist Chair/bed transfer assistive device: Programmer, multimedia   Ambulation assist      Assist level: Minimal Assistance - Patient > 75% Assistive device: Walker-rolling Max distance: 69ft   Walk 10 feet activity   Assist     Assist level: Minimal Assistance - Patient > 75% Assistive device: Walker-rolling   Walk 50 feet activity   Assist Walk 50 feet with 2 turns activity did not occur: Safety/medical concerns(fatigue, poor endurance)  Walk 150 feet activity   Assist Walk 150 feet activity did not occur: Safety/medical concerns         Walk 10 feet on uneven surface  activity   Assist Walk 10 feet on uneven surfaces activity did not occur: Safety/medical concerns         Wheelchair     Assist Will patient use wheelchair at discharge?: No             Wheelchair 50 feet with 2 turns activity    Assist            Wheelchair 150 feet activity     Assist          Blood pressure 123/65, pulse 77,  temperature 97.6 F (36.4 C), resp. rate 20, height 5\' 4"  (1.626 m), weight 107.8 kg, last menstrual period 01/14/1995, SpO2 96 %.    Medical Problem List and Plan: 1.  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 Will ask SLP to investigate.              -ELOS/Goals:DC 5/25 to VVS service for R ICA stenting Dr Donzetta Matters  2.  Antithrombotics: -DVT/anticoagulation:  Pharmaceutical: Other (comment)--Eliquis             -antiplatelet therapy: on DAPT 3. Pain Management: Continue Klonopin for "pain' and seizures.  5/16: started Gabapentin HS 300mg  for neuropathic pain on left side--seems to have helped --5/17 4. Mood: LCSW to follow for evaluation and support.              -antipsychotic agents: N/A 5. Neuropsych: This patient is capable of making decisions on her own behalf. 6. Skin/Wound Care: Routine pressure measures. Eucerin for dry skin 7. Fluids/Electrolytes/Nutrition: Monitor I/O. Improved 1087ml yesterday  8. HTN: Monitor BP tid--continue Capoten, Cardizem and Maxzide.    Vitals:   05/31/19 2005 06/01/19 0533  BP: (!) 141/82 123/65  Pulse: 84 77  Resp: 18 20  Temp: 97.8 F (36.6 C) 97.6 F (36.4 C)  SpO2: 94% 96%  controlled 5/19 9. Seizure: Has been controlled on Klonopin and Keppra bid.   10. Leucocytosis: Monitor for signs of infection.  11. New onset A fib: Monitor HR tid--on Cardizem and Eliquis - rate is up , systolic still elevated will increase cardizem , if this persists  5/17: rate/bp controlled 12. Chronic constipation: Managed with Miralax  13. H/o Depression: On Lexapro and Klonopin.  14. 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  Continue Stiolto. Will add Claritin and nose spray to help with nasal congestion.  Also added Mucinex DM to help with congested cough.  Check chest  x-ray due to reports of worsening. Schedule xopenex (since has tachycardia with albuterol) as needed for SOB/wheezing.  15.  Left ankle sprain: Will order ASO.  Ice tid as well as Voltaren gel to Achilles tendon to help with pain., PRAFO at noc  5/16: discussed schedule for wearing brace. Continue to wear with PT and at rest as much as tolerated. Can remove when resting when feeling tight and ice in between.  16. Sinus pain: As per RN, complaining of sinus pain and already receiving nasal spray and claritin. Meds reviewed and does not appear to be using nasal spray. Will add Mucinex 600mg  BID.   5/16-17:  much improved   LOS: 8 days A FACE TO FACE EVALUATION WAS PERFORMED  Charlett Blake 06/01/2019, 9:14 AM

## 2019-06-01 NOTE — Progress Notes (Signed)
Physical Therapy Weekly Progress Note  Patient Details  Name: Beth [name] MRN: [id number] Date of Birth: [birth year]  Beginning of progress report period: May 25, 2019 End of progress report period: Jun 01, 2019  Today's Date: 06/01/2019 PT Individual Time: 0092-3300 PT Individual Time Calculation (min): 57 min    Patient has met 3 of 3 short term goals. Pt progressing with all functional mobility and is able to ambulate with RW up to 40-60 ft consistently with CGA, has worked on Dietitian and gait without AD requiring min assist. Pt continues to require cues for functional use of L UE as she does have sensory impairments. Pt also limited by baseline COPD and overall decreased cardiovascular endurance.   Patient continues to demonstrate the following deficits muscle weakness, decreased cardiorespiratoy endurance and decreased oxygen support, decreased coordination, and decreased standing balance, hemiplegia, and decreased balance strategies and therefore will continue to benefit from skilled PT intervention to increase functional independence with mobility.  Patient progressing toward long term goals..  Continue plan of care.  PT Short Term Goals Week 1:  PT Short Term Goal 1 (Week 1): Pt will ambulate x 50 ft with RW and min assist PT Short Term Goal 1 - Progress (Week 1): Met PT Short Term Goal 2 (Week 1): Pt will ascend/descend 4 steps with single rail and min assist PT Short Term Goal 2 - Progress (Week 1): Met PT Short Term Goal 3 (Week 1): Pt will perform bed<>chair transfer with CGA and LRAD PT Short Term Goal 3 - Progress (Week 1): Met Week 2:  PT Short Term Goal 1 (Week 2): STG=LTG due to ELOS  Skilled Therapeutic Interventions/Progress Updates:    Pt seated in w/c upon PT arrival, agreeable to therapy tx and denies pain. Continues on 2L O2/min throughout session vis [place]. Therapist donned shoes and L Ace wrap to ankle for edema control- total assist. Pt transported to the  gym in w/c. Pt ambulated x 40 ft this session without AD and min assist working on dynamic balance and gait, wide BOS and short step length. Pt worked on dynamic standing balance this session to perform the following tasks without UE support - toe taps on aerobic step x 2 trials with min-mod assist, standing on airex while performing bean bag toss min-mod assist. Pt reports having to use the bathroom, ambulated from mat>bathroom x 60 ft with RW and CGA, transfer to toilet CGA and able to perform clothing management without assist today, continent of bladder, performs pericare without assist. Stand pivot to w/c from toilet, CGA. Seated in w/c at the sink to wash hands. Pt transported back to room and ambulated x 10 ft to her recliner without AD, min assist and cues for safety. Pt left in recliner with needs in reach and chair alarm set.   Therapy Documentation Precautions:  Precautions Precautions: Fall Precaution Comments: Left inattention,  O2 dependent at home Restrictions Weight Bearing Restrictions: No   Therapy/Group: Individual Therapy  Netta Corrigan, PT, DPT, CSRS 06/01/2019, 7:49 AM

## 2019-06-01 NOTE — Progress Notes (Signed)
Patient ID: Beth [name], female   DOB: 1940-03-06, 79 y.o.   MRN: [id number] Team Conference Report to Patient/Family  Team Conference discussion was reviewed with the patient and caregiver, including goals, any changes in plan of care and target discharge date.  Patient and caregiver express understanding and are in agreement.  The patient has a target discharge date of 06/04/19. Sw followed up with spouse and daughter about ICA stenting procedure. Both unsure of procedure date, sw will follow up with Dr. Posey Pronto.  Dyanne Iha 06/01/2019, 2:26 PM

## 2019-06-01 NOTE — Patient Care Conference (Signed)
Inpatient RehabilitationTeam Conference and Plan of Care Update Date: 06/01/2019   Time: 1:57 PM    Patient Name: Beth [name]      Medical Record Number: [id number]  Date of Birth: [birth year] Sex: Female         Room/Bed: 4W22C/4W22C-01 Payor Info: Payor: MEDICARE / Plan: MEDICARE PART A AND B / Product Type: *No Product type* /    Admit Date/Time:  05/24/2019  2:37 PM  Primary Diagnosis:  Acute ischemic right MCA stroke [facility])  Patient Active Problem List   Diagnosis Date Noted  . On home O2 05/24/2019  . Acute right arterial ischemic stroke, MCA (middle cerebral artery) ([place]) 05/24/2019  . Stenosis of right carotid artery   . Atrial fibrillation with RVR ([place]) 05/18/2019  . Seizures ([place])   . HLD (hyperlipidemia)   . Macrocytosis   . Acute ischemic right MCA stroke ([place])   . Depression   . Allergic rhinitis 05/02/2019  . At high risk for breast cancer 12/09/2017  . 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 Obesity, morbid, BMI 40.0-49.9 ([place]) 04/30/2016  . Chronic respiratory failure ([place]) 12/11/2015  . COPD without exacerbation ([place]) 10/08/2015  . Genetic testing 08/26/2015  . 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  . Family history of ovarian cancer 07/26/2015  . 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 Benign paroxysmal positional vertigo 04/06/2014  . Tonic-clonic seizure syndrome ([place]) 04/06/2014  . Pulsatile tinnitus of right ear 04/06/2014  . Other forms of epilepsy and recurrent seizures without mention of intractable epilepsy 04/21/2013  . SAH (subarachnoid hemorrhage) ([place]) 04/21/2013  . 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 Seizure disorder, complex partial, 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[place])   . Stroke, hemorrhagic ([place])   . HTN (hypertension)   . Vestibulitis of ear     Expected Discharge Date: Expected Discharge Date: 06/04/19  Team Members Present: Physician leading conference: Dr. Alysia Penna Care Coodinator Present: Nestor Lewandowsky, RN, BSN, CRRN;Christina Sampson Goon, [place] Nurse Present: Mohammed Kindle, RN PT Present: Michaelene Song, PT OT Present: Clyda Greener, OT SLP Present: Jettie Booze, CF-SLP PPS Coordinator present : Gunnar Fusi, SLP     Current Status/Progress Goal Weekly Team Focus  Bowel/Bladder   Patient is continent of bowel and bladder.  Periods of urinary frequency.  LBM 05/31/19  Patient will maintain bowel and bladder continence  Assess Qshift and PRN for s/sx of changes   Swallow/Nutrition/ Hydration             ADL's   Min asist for UB bathing with min assist for UB dressing.  LB bathing and dressing at min assist as well.  Transfers to the toilet and the shower at min guard with use of the RW and hand splint on the left.  LUE functional use is at a gross assist level with supervision.  Decreased sensory feedback on the left as well.  supervision overall  selfcare retraining, balance retraining, transfer training, DME education, neuromuscular re-education   Mobility   min assist bed mobility, CGA/min assist sit<>stand and stand pivots with RW, gait up to 51f using RW with min assist  supervision overall  activity tolerance/cardiopulmonary endurance training, gait training, transfer training, standing balance, B LE strengthening, pt education   Communication             Safety/Cognition/ Behavioral Observations  Supervision A  Mod I  complex problem solving, selective attention and recall   Pain   Patient c/o pain to RUE relieved by  Tylenol  Patient will have pain level less than 4 and maintain activity tolerance  Assess pain Qshift and PRN.  Administer Tylenol PRN   Skin   Patient has scattered subcutaneous bruising, particulary to BUE  Patient will remain free of skin breakdown and infection  Assess skin Qshift and PRN    Rehab Goals Patient on target to meet rehab goals: Yes Rehab Goals Revised: patient on target wit current goals *See Care Plan and progress notes for long and  short-term goals.     Barriers to Discharge  Current Status/Progress Possible Resolutions Date Resolved   Nursing                  PT                    OT                  SLP                Care Coordinator Medical stability sw not aware of any currently on target          Discharge Planning/Teaching Needs:  Patient and spouse plan to discharge home  Will schedule if needed   Team Discussion:  Stent surgery was delayed until June. Patient making progress will not need to manage stairs as has a ramp, limited by COPD but that is chronic.Has memory issues which will be reviewed with family.   Revisions to Treatment Plan:  Discharge date moved up due to progress noted.     Medical Summary Current Status: COPD with wheezing,hypoK, likely due to HCTZ Weekly Focus/Goal: Potassium supplementation with repeat BMET  Barriers to Discharge: Medical stability;Other (comments)  Barriers to Discharge Comments: ICA stenting on 5/25 Possible Resolutions to Barriers: repeat labwork on KCL   Continued Need for Acute Rehabilitation Level of Care: The patient requires daily medical management by a physician with specialized training in physical medicine and rehabilitation for the following reasons: Direction of a multidisciplinary physical rehabilitation program to maximize functional independence : Yes Medical management of patient stability for increased activity during participation in an intensive rehabilitation regime.: Yes Analysis of laboratory values and/or radiology reports with any subsequent need for medication adjustment and/or medical intervention. : Yes   I attest that I was present, lead the team conference, and concur with the assessment and plan of the team.   Dorien Chihuahua B 06/01/2019, 1:57 PM

## 2019-06-01 NOTE — Progress Notes (Signed)
Speech Language Pathology Weekly Progress and Session Note  Patient Details  Name: Beth [name] MRN: [id number] Date of Birth: 10-12-1940  Beginning of progress report period: May 25, 2019 End of progress report period: Jun 01, 2019  Today's Date: 06/01/2019 SLP Individual Time: 5397-6734 SLP Individual Time Calculation (min): 41 min  Short Term Goals: Week 1: SLP Short Term Goal 1 (Week 1): Pt will demonstrate ability to problem solve complex functional situations with Supervision A cues. SLP Short Term Goal 1 - Progress (Week 1): Met SLP Short Term Goal 2 (Week 1): Pt will selectively attend to tasks with Supervision A cues for redirection. SLP Short Term Goal 2 - Progress (Week 1): Met SLP Short Term Goal 3 (Week 1): Pt will recall new and/or daily information with Supervision A cues for use of strategies or aids. SLP Short Term Goal 3 - Progress (Week 1): Met    New Short Term Goals: Week 2: SLP Short Term Goal 1 (Week 2): STG=LTG due to remaining length of stay  Weekly Progress Updates: Pt has made excellent functional gains and met 3 out of 3 short term goals this reporting period. Pt is currently Supervision assist for complex cognitive tasks due to impairments impacting her complex problem solving, selective attention, and short term memory. Pt has demonstrated improved complex problem solving and ability to attend to tasks in the presence of external distractions, as well as use of compensatory memory strategies. Pt education is ongoing; no family has been present for ST sessions to date. Pt would continue to benefit from skilled ST while inpatient in order to maximize functional independence and reduce burden of care prior to discharge. It is not anticipated that pt will need follow up ST services.       Intensity: Minumum of 1-2 x/day, 30 to 90 minutes Frequency: 3 to 5 out of 7 days Duration/Length of Stay: 06/07/19 Treatment/Interventions: Cognitive  remediation/compensation;Cueing hierarchy;Functional tasks;Patient/family education;Internal/external aids   Daily Session  Skilled Therapeutic Interventions: Pt was seen for skilled ST targeting cognitive goals. SLP provided verbal review and handout regarding compensatory memory strategies. Functional conversation also focused on how to better use strategies already in place at home more efficiently with focus on routines, note taking, and "everything has its place".  In functional conversation regarding safety at home, pt generated a list of questions she wanted to ask PT/OT regarding equipment, demonstrating use of compensatory memory strategy Mod I. Pt left sitting in chair with alarm set and needs within reach. Continue per current plan of care.        Pain Pain Assessment Pain Scale: 0-10 Pain Score: 0-No pain  Therapy/Group: Individual Therapy  Arbutus Leas 06/01/2019, 7:20 AM

## 2019-06-01 NOTE — Progress Notes (Signed)
Physical Therapy Session Note  Patient Details  Name: Beth [name] MRN: [id number] Date of Birth: 05-14-1940  Today's Date: 06/01/2019 PT Individual Time: 1138-1205 PT Individual Time Calculation (min): 27 min   Short Term Goals: Week 1:  PT Short Term Goal 1 (Week 1): Pt will ambulate x 50 ft with RW and min assist PT Short Term Goal 1 - Progress (Week 1): Met PT Short Term Goal 2 (Week 1): Pt will ascend/descend 4 steps with single rail and min assist PT Short Term Goal 2 - Progress (Week 1): Met PT Short Term Goal 3 (Week 1): Pt will perform bed<>chair transfer with CGA and LRAD PT Short Term Goal 3 - Progress (Week 1): Met  Week 2:  PT Short Term Goal 1 (Week 2): STG=LTG due to ELOS  Skilled Therapeutic Interventions/Progress Updates: Pt presented at toilet with nsg present agreeable to therapy. Nsg handed off pt to complete toileting. Pt performed toilet transfer with RW and CGA and was able to perform peri-care with supervision. Performed LB clothing management with minA. Pt ambulated to w/c and performed hand hygiene at sink from chair. Pt transported to day room and participated in ambulation for gait training/endurance. Pt ambulated 7f x 3 with pt performing STS transfers with supervision and ambulation with CGA with PTA providing intermittent cues for managing L hand as would slip off hand grip or off RW altogether. Pt required 2-3 min breaks between bouts for recovery. Pt transported back to room at end of session with remained in w/c to eat lunch. Pt left with belt alarm on, call bell within reach and needs met.   Therapy Documentation Precautions:  Precautions Precautions: Fall Precaution Comments: Left inattention,  O2 dependent at home Restrictions Weight Bearing Restrictions: No General:   Vital Signs:   Pain: Pain Assessment Pain Scale: 0-10 Pain Score: 0-No pain   Therapy/Group: Individual Therapy  Krystofer Hevener  Berneta Sconyers, PTA  06/01/2019, 1:15 PM

## 2019-06-02 ENCOUNTER — Inpatient Hospital Stay (HOSPITAL_COMMUNITY): Payer: Medicare Other | Admitting: Physical Therapy

## 2019-06-02 ENCOUNTER — Inpatient Hospital Stay (HOSPITAL_COMMUNITY): Payer: Medicare Other | Admitting: Occupational Therapy

## 2019-06-02 MED ORDER — TIOTROPIUM BROMIDE-OLODATEROL 2.5-2.5 MCG/ACT IN AERS
2.0000 | INHALATION_SPRAY | Freq: Every day | RESPIRATORY_TRACT | Status: DC
  Administered 2019-06-03 – 2019-06-05 (×3): 2 via RESPIRATORY_TRACT

## 2019-06-02 MED ORDER — PSEUDOEPHEDRINE HCL 30 MG PO TABS
60.0000 mg | ORAL_TABLET | Freq: Two times a day (BID) | ORAL | Status: AC
  Administered 2019-06-02 – 2019-06-03 (×3): 60 mg via ORAL
  Filled 2019-06-02: qty 1
  Filled 2019-06-02: qty 2
  Filled 2019-06-02: qty 1
  Filled 2019-06-02 (×3): qty 2

## 2019-06-02 NOTE — Progress Notes (Signed)
Physical Therapy Session Note  Patient Details  Name: Beth [name] MRN: [id number] Date of Birth: [birth year]  Today's Date: 06/02/2019 PT Individual Time: 1035-1105 and 1640-1750 PT Individual Time Calculation (min): 30 min and 70 min  Short Term Goals: Week 2:  PT Short Term Goal 1 (Week 2): STG=LTG due to ELOS  Skilled Therapeutic Interventions/Progress Updates:    Session 1: Pt received sitting in w/c and agreeable to therapy session. Pt noted not to have nasal cannula in nose and appeared unaware of this - received and maintained on 2L via nasal cannula throughout session. Therapist placed walker bag on RW. Transported to/from gym in w/c for time management and energy conservation. Simulated car transfer (SUV height) with education on proper sequencing for increased safety awareness - had pt manage O2 line and she used walker bag to collect line - performed transfer with CGA for steadying. Continues to require seated rest breaks for breathing between activities due to feeling SOB. Transported back to room and left seated in w/c with needs in reach and chair alarm on.  Session 2: Pt received sitting in w/c with her daughter, [name], and husband, Jenny Reichmann, present. Pt agreeable to therapy session. Received and maintained on 2L of O2 via nasal cannula during session - pt reports she has a 13ft O2 line to a stationary tank in home then uses a 2L portable tank when going somewhere in the car. Pt's husband has pictures of ramped entrances into the home and pt will need to go in/out via wheelchair due to set-up of ramps. Family reports concerns regarding pt's safety at home due to pt's impaired dual-task and pt's husband's recent memory impairments. Therapist educated family on her 45 using RW for standing/ambulation at CGA/close supervision level of assistance as well as the effects of dual-cognitive challenges during mobility and need for pt to focus on the 1 mobility task at a time to increase her safety  (i.e. avoid conversations while pt's performing a mobility task). Sit<>stands using RW with close supervision for safety during session - min cuing for L hand placement on/off RW orthotic. Gait training ~40ft x2 to/from bathroom using RW with CGA for safety - pt attempting to manage O2 line requiring max cuing for placement of line to navigate safely in room - when attempting to manage O2 line or move AD pt's L hand will either slide out of orthotic or pull orthotic off of RW (discussed with family the idea of more securely attaching RW orthotic to her RW). Standing with close supervision performed LB clothing management with supervision and mod cuing for visually observing L hand participate in task due to impaired sensation. Sit<>stand to/from toilet using grab bar with close supervision. Continent of bladder and performed seated peri-care without assist.  Transported to/from ADL apartment in w/c for time management and energy conservation. Ambulated ~12ft in ADL apartment using RW with CGA/close supervision for safety and mod cuing for safe O2 line management. Therapist reinforced education to pt/family regarding her dual-task cognitive impairments and need for 24hr support with someone assisting with O2 line management, observing L hand placement on RW orthotic (as it does slip off at times), and ensuring safe pathway for navigating in home. Pt's family reporting decreased fear of pt's safety at home but planning for additional education/hands-on training tomorrow. Transported back to room and left seated in w/c with needs in reach and chair alarm on.  Therapy Documentation Precautions:  Precautions Precautions: Fall Precaution Comments: Left inattention,  O2 dependent  at home Restrictions Weight Bearing Restrictions: No  Pain: Session 1: No reports of pain throughout session.  Session 2: Reports some pain in arch of L foot; noticed increased edema compared to prior - therapist reinforced  education/importance of elevating LE during the day while sitting.   Therapy/Group: Individual Therapy  Tawana Scale, PT, DPT 06/02/2019, 8:01 AM

## 2019-06-02 NOTE — Progress Notes (Addendum)
Occupational Therapy Session Note  Patient Details  Name: Beth [name] MRN: [id number] Date of Birth: [birth year]  Today's Date: 06/02/2019 OT Individual Time: 0904-1003 OT Individual Time Calculation (min): 59 min    Short Term Goals: Week 2:  OT Short Term Goal 1 (Week 2): Continue working on established LTGs set at supervision level.  Skilled Therapeutic Interventions/Progress Updates:    Session 1: [phone])  Pt worked on dressing and toileting during session.  She was able to donn her sports bra with supervision as well as her pullover shirt.  She needed mod instructional cueing for orientation of shirt as initially she donned it backwards without any awareness.  Once corrected, she was able to donn it with setup assist.  She then worked on donning pants with mod instructional cueing and min facilitation secondary to not beginning with the LLE first and having difficulty crossing her LLE over the right knee.  She completed toilet transfer with supervision using the RW and hand splint on the left side.  She was able to complete all toileting tasks with min guard as well before transferring out to the wheelchair.  Finished session with pt in the wheelchair and NT in to assist with finishing dressing secondary to decreased time.  Pt continues to demonstrate decreased divided attention throughout session secondary to her own internal distraction with conversation as well as decreased memory of techniques session to session with regards to dressing and hand placement with transfers.      Session 2: (1405-1450)  Pt in wheelchair to start session requesting the need to toilet.  She worked on donning her left shoe over the ankle splint with min assist for therapist to help support the LLE when crossing it over the right knee.  She then ambulated to the toilet with use of the RW and close supervision.  Pt needed mod instructional cueing to place the LUE on the walker splint and velcro the strap.  Even  when doing go at times, she pulls the splint off of the walker without any awareness that this has happened, requiring cueing for therapist and assist to reapply it.  She completed toileting tasks with min guard assist and mod instructional cueing to maintain visual attention on the LUE when trying to pull up pants.  She has mostly been successful pulling up underpants and pants on the left side using a hook method with the left hand since she lacks sensation and coordination for opposition.  She next ambulated out to the sink where she completed washing her hands with min guard assist.  She returned to the wheelchair to complete task.  To finish session, she wanted to remove her left shoe and ankle splint secondary to being uncomfortable.  She was able to complete this with mod instructional cueing and min assist.  Pt left with call button and phone in reach and LUE supported on half lap tray.   Therapy Documentation Precautions:  Precautions Precautions: Fall Precaution Comments: Left inattention,  O2 dependent at home Restrictions Weight Bearing Restrictions: No   Pain: Pain Assessment Pain Scale: 0-10 Pain Score: 3  ADL: See Care Tool Section for some details of mobility and selfcare  Therapy/Group: Individual Therapy  Amarie Tarte OTR/L 06/02/2019, 12:43 PM

## 2019-06-02 NOTE — Progress Notes (Signed)
[place] PHYSICAL MEDICINE & REHABILITATION PROGRESS NOTE   Subjective/Complaints:  Discussed Right ICA stenting date 06/20/19 per Dr Gae Gallop     ROS: Patient denies CP, SOB, N/V/D Objective:   No results found. No results for input(s): WBC, HGB, HCT, PLT in the last 72 hours. No results for input(s): NA, K, CL, CO2, GLUCOSE, BUN, CREATININE, CALCIUM in the last 72 hours.  Intake/Output Summary (Last 24 hours) at 06/02/2019 0834 Last data filed at 06/02/2019 0754 Gross per 24 hour  Intake 1020 ml  Output --  Net 1020 ml     Physical Exam: Vital Signs Blood pressure 135/69, pulse 70, temperature 97.6 F (36.4 C), resp. rate 18, height 5\' 4"  (1.626 m), weight 107.8 kg, last menstrual period 01/14/1995, SpO2 95 %.   General: No acute distress Mood and affect are appropriate Heart: Regular rate and rhythm no rubs murmurs or extra sounds Lungs: Clear to auscultation, breathing unlabored, no rales or wheezes Abdomen: Positive bowel sounds, soft nontender to palpation, nondistended Extremities: No clubbing, cyanosis, or edema Skin: No evidence of breakdown, no evidence of rash    3- to 3/5 left delt bi tri grip, 3- HF, 3-to 3/5 KE, 2+ DF  Sensory exam normal sensation to light touch and proprioception in bilateral  lower extremities. Sl decreased LUE.  Cerebellar exam normal finger to nose to finger as well as heel to shin in bilateral upper and lower extremities Musculoskeletal: Full range of motion in all 4 extremities. No joint swelling   Assessment/Plan: 1. 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  Physiatrist is providing close team supervision and 24 hour management of active medical problems listed below.  Physiatrist and rehab team continue to assess barriers to discharge/monitor patient progress toward functional  and medical goals  Care Tool:  Bathing    Body parts bathed by patient: Left arm, Chest, Abdomen, Front perineal area, Right upper leg, Left upper leg, Face, Right lower leg   Body parts bathed by helper: Right arm     Bathing assist Assist Level: Moderate Assistance - Patient 50 - 74%     Upper Body Dressing/Undressing Upper body dressing   What is the patient wearing?: Bra, Pull over shirt    Upper body assist Assist Level: Minimal Assistance - Patient > 75%    Lower Body Dressing/Undressing Lower body dressing      What is the patient wearing?: Pants     Lower body assist Assist for lower body dressing: Minimal Assistance - Patient > 75%     Toileting Toileting    Toileting assist Assist for toileting: Minimal Assistance - Patient > 75%     Transfers Chair/bed transfer  Transfers assist     Chair/bed transfer assist level: Contact Guard/Touching assist Chair/bed transfer assistive device: Programmer, multimedia   Ambulation assist      Assist level: Contact Guard/Touching assist Assistive device: Walker-rolling Max distance: 60 ft   Walk 10 feet activity   Assist     Assist level: Contact Guard/Touching assist Assistive device: Walker-rolling   Walk 50 feet activity   Assist Walk 50 feet with 2 turns activity did not occur: Safety/medical concerns(fatigue, poor endurance)  Assist level: Contact Guard/Touching assist Assistive device: Walker-rolling    Walk 150 feet activity   Assist Walk 150 feet activity did not occur: Safety/medical concerns  Walk 10 feet on uneven surface  activity   Assist Walk 10 feet on uneven surfaces activity did not occur: Safety/medical concerns         Wheelchair     Assist Will patient use wheelchair at discharge?: No             Wheelchair 50 feet with 2 turns activity    Assist            Wheelchair 150 feet activity     Assist           Blood pressure 135/69, pulse 70, temperature 97.6 F (36.4 C), resp. rate 18, height 5\' 4"  (1.626 m), weight 107.8 kg, last menstrual period 01/14/1995, SpO2 95 %.    Medical Problem List and Plan: 1.  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 Will ask SLP to investigate.              -ELOS/Goals:DC 5/22, stenting 6/7 per Dr Scot Dock  2.  Antithrombotics: -DVT/anticoagulation:  Pharmaceutical: Other (comment)--Eliquis             -antiplatelet therapy: on DAPT 3. Pain Management: Continue Klonopin for "pain' and seizures.  5/16: started Gabapentin HS 300mg  for neuropathic pain on left side--seems to have helped --5/17 4. Mood: LCSW to follow for evaluation and support.              -antipsychotic agents: N/A 5. Neuropsych: This patient is capable of making decisions on her own behalf. 6. Skin/Wound Care: Routine pressure measures. Eucerin for dry skin 7. Fluids/Electrolytes/Nutrition: Monitor I/O. Improved 1060ml yesterday  8. HTN: Monitor BP tid--continue Capoten, Cardizem and Maxzide.    Vitals:   06/01/19 2039 06/02/19 0520  BP: (!) 149/79 135/69  Pulse: 67 70  Resp: 20 18  Temp: 97.7 F (36.5 C) 97.6 F (36.4 C)  SpO2: 97% 95%  controlled 5/20 9. Seizure: Has been controlled on Klonopin and Keppra bid.   10. Leucocytosis: Monitor for signs of infection.  11. New onset A fib: Monitor HR tid--on Cardizem and Eliquis - rate is controlled  12. Chronic constipation: Managed with Miralax  13. H/o Depression: On Lexapro and Klonopin.  14. 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  Continue Stiolto. Will add Claritin and nose spray to help with nasal congestion.  Also added Mucinex DM to help with congested cough.  Check chest x-ray due to reports of worsening. Schedule xopenex (since has  tachycardia with albuterol) as needed for SOB/wheezing.  15.  Left ankle sprain: Will order ASO.  Ice tid as well as Voltaren gel to Achilles tendon to help with pain., PRAFO at noc  5/16: discussed schedule for wearing brace. Continue to wear with PT and at rest as much as tolerated. Can remove when resting when feeling tight and ice in between.  16. Sinus pain: As per RN, complaining of sinus pain and already receiving nasal spray and claritin. Meds reviewed and does not appear to be using nasal spray. Will add Mucinex 600mg  BID.   5/16-17: much improved   LOS: 9 days A FACE TO [place] E Annalucia Laino 06/02/2019, 8:34 AM

## 2019-06-03 ENCOUNTER — Inpatient Hospital Stay (HOSPITAL_COMMUNITY): Payer: Medicare Other | Admitting: Physical Therapy

## 2019-06-03 ENCOUNTER — Inpatient Hospital Stay (HOSPITAL_COMMUNITY): Payer: Medicare Other | Admitting: Occupational Therapy

## 2019-06-03 ENCOUNTER — Inpatient Hospital Stay (HOSPITAL_COMMUNITY): Payer: Medicare Other | Admitting: Speech Pathology

## 2019-06-03 LAB — BASIC METABOLIC PANEL
Anion gap: 9 (ref 5–15)
BUN: 8 mg/dL (ref 8–23)
CO2: 28 mmol/L (ref 22–32)
Calcium: 8.9 mg/dL (ref 8.9–10.3)
Chloride: 87 mmol/L — ABNORMAL LOW (ref 98–111)
Creatinine, Ser: 0.69 mg/dL (ref 0.44–1.00)
GFR calc Af Amer: >60 mL/min (ref >60)
GFR calc non Af Amer: >60 mL/min (ref >60)
Glucose, Bld: 111 mg/dL — ABNORMAL HIGH (ref 70–99)
Potassium: 4.8 mmol/L (ref 3.5–5.1)
Sodium: 124 mmol/L — ABNORMAL LOW (ref 135–145)

## 2019-06-03 MED ORDER — MUSCLE RUB 10-15 % EX CREA
TOPICAL_CREAM | CUTANEOUS | Status: DC | PRN
  Filled 2019-06-03: qty 85

## 2019-06-03 MED ORDER — GABAPENTIN 600 MG PO TABS: 300.0000 mg | ORAL_TABLET | Freq: Every day | ORAL | 0 refills | Status: DC

## 2019-06-03 MED ORDER — CLOPIDOGREL BISULFATE 75 MG PO TABS: 75.0000 mg | ORAL_TABLET | Freq: Every day | ORAL | 0 refills | Status: DC

## 2019-06-03 MED ORDER — APIXABAN 5 MG PO TABS: 5.0000 mg | ORAL_TABLET | Freq: Two times a day (BID) | ORAL | 0 refills | Status: DC

## 2019-06-03 MED ORDER — POLYETHYLENE GLYCOL 3350 17 G PO PACK: 17.0000 g | PACK | Freq: Every day | ORAL | 0 refills | Status: DC

## 2019-06-03 MED ORDER — MUSCLE RUB 10-15 % EX CREA: 1.0000 "application " | TOPICAL_CREAM | CUTANEOUS | 0 refills | Status: DC | PRN

## 2019-06-03 MED ORDER — LORATADINE 10 MG PO TABS: 10.0000 mg | ORAL_TABLET | Freq: Every day | ORAL | Status: DC

## 2019-06-03 MED ORDER — HYDROCERIN EX CREA: 1.0000 "application " | TOPICAL_CREAM | Freq: Two times a day (BID) | CUTANEOUS | 0 refills | Status: DC

## 2019-06-03 MED ORDER — SALINE SPRAY 0.65 % NA SOLN: 1.0000 | Freq: Four times a day (QID) | NASAL | 0 refills | Status: DC

## 2019-06-03 MED ORDER — SALINE SPRAY 0.65 % NA SOLN: 1.0000 | NASAL | 0 refills | Status: DC | PRN

## 2019-06-03 MED ORDER — FAMOTIDINE 20 MG PO TABS
20.0000 mg | ORAL_TABLET | Freq: Two times a day (BID) | ORAL | 0 refills | Status: DC
Start: 2019-06-03 — End: 2021-12-11

## 2019-06-03 MED ORDER — ACETAMINOPHEN 325 MG PO TABS: 325.0000 mg | ORAL_TABLET | ORAL | Status: AC | PRN

## 2019-06-03 MED ORDER — CYANOCOBALAMIN 500 MCG PO TABS: 500.0000 ug | ORAL_TABLET | Freq: Every day | ORAL | Status: DC

## 2019-06-03 MED ORDER — DILTIAZEM HCL ER COATED BEADS 300 MG PO CP24: 300.0000 mg | ORAL_CAPSULE | Freq: Every day | ORAL | 0 refills | Status: DC

## 2019-06-03 MED ORDER — POTASSIUM CHLORIDE CRYS ER 20 MEQ PO TBCR: 20.0000 meq | EXTENDED_RELEASE_TABLET | Freq: Every day | ORAL | Status: DC

## 2019-06-03 NOTE — Progress Notes (Signed)
Occupational Therapy Discharge Summary  Patient Details  Name: Beth [name] MRN: [id number] Date of Birth: 12/13/1940  Today's Date: 06/03/2019 OT Individual Time: 0903-1006 OT Individual Time Calculation (min): 63 min    Session Note:  Pt's spouse and her daughter in for education this session.  Pt completed donning her sports bra and a pullover shirt with setup and increased time following correct hemi technique.  Discussed correct technique with crossing the RLE over the left knee for donning LB clothing, but did not practice as she already had on her pants.  She was able to complete toilet transfer and toileting with supervision.  Mod instructional cueing was still needed for pt to place the LUE up on the walker and strap it to the walker splint.  She also needed cueing to maintain attention on the LUE as well as it did pull the splint off when ambulating out of the bathroom and pt was not aware of this.  Provided handout and verbal education on exercises and activities to be completed daily at home as well for the LUE.  Discussed and therapist demonstrated possible techniques for shower transfer to the walk-in shower.  Her daughter feels that their shower is too small and she will have pt shower at her house, where she has a larger walk-in shower.  Finished session with pt in the wheelchair with family present.  Safety belt in place with call button and phone in reach.   Patient has met 6 of 9 long term goals due to improved activity tolerance, improved balance, postural control, ability to compensate for deficits, functional use of  LEFT upper extremity and improved coordination.  Patient to discharge at overall Supervision level.  Patient's care partner is independent to provide the necessary physical and cognitive assistance at discharge.    Reasons goals not met: pt needs min assist for bathing and dressing tasks.  She is currently only using the LUE at supervision level at a gross assist  level not at a diminished level.   Recommendation:  Patient will benefit from ongoing skilled OT services in home health setting to continue to advance functional skills in the area of BADL and Reduce care partner burden.  Pt will continue to benefit from HHOT to further increase pt's independence with selfcare tasks and functional transfers as well as to help her progress LUE functional use back to WFLs.    Equipment: 3:1  Reasons for discharge: treatment goals met and discharge from hospital  Patient/family agrees with progress made and goals achieved: Yes  OT Discharge Precautions/Restrictions  Precautions Precautions: Fall Precaution Comments: Left inattention,  O2 dependent at home Restrictions Weight Bearing Restrictions: No  Pain Pain Assessment Pain Scale: Faces Pain Score: 0-No pain ADL ADL Eating: Independent Where Assessed-Eating: Wheelchair Grooming: Independent Where Assessed-Grooming: Wheelchair Upper Body Bathing: Supervision/safety Where Assessed-Upper Body Bathing: Shower Lower Body Bathing: Minimal assistance Where Assessed-Lower Body Bathing: Shower Upper Body Dressing: Supervision/safety Where Assessed-Upper Body Dressing: Wheelchair Lower Body Dressing: Minimal assistance Where Assessed-Lower Body Dressing: Wheelchair Toileting: Supervision/safety Where Assessed-Toileting: Bedside Commode Toilet Transfer: Close supervision Toilet Transfer Method: Ambulating Toilet Transfer Equipment: Bedside commode Walk-In Shower Transfer: Close supervision Walk-In Shower Transfer Method: Ambulating Walk-In Shower Equipment: Transfer tub bench Vision Baseline Vision/History: Wears glasses Wears Glasses: At all times Patient Visual Report: No change from baseline Eye Alignment: Within Functional Limits Ocular Range of Motion: Within Functional Limits Tracking/Visual Pursuits: Able to track stimulus in all quads without difficulty Convergence: Within functional  limits Visual   Fields: No apparent deficits Perception  Perception: Impaired Inattention/Neglect: Does not attend to left side of body Praxis Praxis: Intact Cognition Overall Cognitive Status: Impaired/Different from baseline Arousal/Alertness: Awake/alert Orientation Level: Oriented X4 Attention: Sustained Focused Attention: Appears intact Sustained Attention: Appears intact Selective Attention: Impaired Selective Attention Impairment: Verbal basic;Functional basic Memory: Impaired Memory Impairment: Decreased recall of new information Awareness: Appears intact Problem Solving: Impaired Problem Solving Impairment: Functional complex Executive Function: Organizing Organizing: Impaired Organizing Impairment: Functional complex Safety/Judgment: Appears intact Comments: Pt continues to demonstrate decreased selective attention secondary to being distracted with conversations when working on ADL tasks. Sensation Sensation Light Touch: Impaired Detail Light Touch Impaired Details: Impaired LUE Proprioception: Impaired Detail Proprioception Impaired Details: Impaired LUE Stereognosis: Impaired Detail Stereognosis Impaired Details: Impaired LUE Additional Comments: Pt able to feel some touch in the left hand, but unable to determine exact location and has absent proprioception throughout the UE Coordination Gross Motor Movements are Fluid and Coordinated: No Fine Motor Movements are Fluid and Coordinated: No Coordination and Movement Description: Pt demonstrates decreased coordination and proprioception in the LUE.  She can use it as a gross assist with supervision for holding items to be opened but is unable to complete efficient tip to tip pinch for FM tasks.  She will pull her hand off of the walker splint on the walker without any awareness that it has moved. Motor  Motor Motor: Hemiplegia Motor - Discharge Observations: Still with mild left hemiparesis and significant sensory  impairment in the LUE and LLE Mobility  Bed Mobility Bed Mobility: Sit to Supine;Supine to Sit Supine to Sit: Supervision/Verbal cueing Sit to Supine: Supervision/Verbal cueing Transfers Sit to Stand: Supervision/Verbal cueing Stand to Sit: Supervision/Verbal cueing  Trunk/Postural Assessment  Cervical Assessment Cervical Assessment: Exceptions to WFL(forward head) Thoracic Assessment Thoracic Assessment: Exceptions to WFL(thoracic rounding) Lumbar Assessment Lumbar Assessment: Exceptions to WFL(flexed posture)  Balance Balance Balance Assessed: Yes Static Sitting Balance Static Sitting - Balance Support: Feet supported Static Sitting - Level of Assistance: 7: Independent Dynamic Sitting Balance Dynamic Sitting - Balance Support: During functional activity Dynamic Sitting - Level of Assistance: 5: Stand by assistance Static Standing Balance Static Standing - Balance Support: During functional activity Static Standing - Level of Assistance: 5: Stand by assistance Dynamic Standing Balance Dynamic Standing - Balance Support: During functional activity Dynamic Standing - Level of Assistance: 5: Stand by assistance Extremity/Trunk Assessment RUE Assessment RUE Assessment: Within Functional Limits General Strength Comments: AROM and strength WFLS LUE Assessment LUE Assessment: Exceptions to WFL Active Range of Motion (AROM) Comments: AAROM WFLS for all joints General Strength Comments: Pt currently Brunnstrum stage V movement in the arm and hand.  She demonstrates isolated movements deviating away from synergy, she can use the LUE as a gross assist for holding itmes with supervision but needs visual input to keep from dropping them.  Use of a wash mit is required for bathing tasks.  Mod facilitation is needed if attempting to grasp clothing with the LUE and help pull up.  She can oppose the thumb to the first digit but not others efficiently.   , OTR/L 06/03/2019, 12:59  PM 

## 2019-06-03 NOTE — Progress Notes (Signed)
Physical Therapy Discharge Summary  Patient Details  Name: Beth [name] MRN: [id number] Date of Birth: 06-06-1940  Today's Date: 06/03/2019 PT Individual Time: [id number] and 1403-1440 PT Individual Time Calculation (min): 60 min and 37 min    Patient has met 5 of 6 long term goals due to improved activity tolerance, improved balance, improved postural control, increased strength, decreased pain, ability to compensate for deficits, functional use of  left upper extremity and left lower extremity, improved attention, improved awareness and improved coordination.  Patient to discharge at an ambulatory level Supervision.   Patient's care partners attended hands-on training/education and are independent to provide the necessary physical and cognitive assistance at discharge.  Reasons goals not met: Patient continues to require min/mod assist for stair navigation partially due to L ankle pain with DF during weightbearing position.  Recommendation:  Patient will benefit from ongoing skilled PT services in home health setting to continue to advance safe functional mobility, address ongoing impairments in activity tolerance, cardiopulmonary endurance, standing balance, higher level gait training, stair navigation, and minimize fall risk.  Equipment: No equipment provided - pt has all necessary DME (RW and w/c)  Reasons for discharge: treatment goals met and discharge from hospital  Patient/family agrees with progress made and goals achieved: Yes  Skilled Therapeutic Interventions/Progress Updates:  Session 1: Pt received supine in bed with RN present for morning medication. Pt requiring increased time to take medications and initiate OOB mobility. MD in/out for morning assessment. Supine>sitting L EOB, HOB flat and not using bedrail, mod-I with increased time. Sitting EOB therapist donned B shoes max assist for time management - pt declining to wear L ankle brace reporting still having some  discomfort in arch of foot like yesterday. Sit<>stands using RW with close supervision for safety. Gait ~80f to bathroom using RW with close supervision - continues to require mod cuing for safe O2 line management. Sit<>stand to/from toilet using RW/grab bars with close supervision. Continent of bladder and performed seated peri-care with set-up assist. Donned clean LB clothing max assist for time management. Gait ~173fusing RW to wheelchair at sink with close supervision and mod cuing to stay closer to AD and for safe O2 line management. Pt requests to sit at sink and perform hygiene. Performed oral care and washed face with set-up assist. Pt's husband and daughter arrived. Pt reports she may shower at her daughters house since the pt's showers are not easily accessible but discussed pt's daughter's house has 3 STE with R HR only and she has not performed stair navigation recently - therapist recommended discussing this further with HHPT prior to attempting stair navigation at daughter's house. Pt's daughter reports interest in higher caregivers therefore SW notified to provide information for this. Pt left seated in w/c at sink with JaJeneen RinksOT arriving to assume care of patient.   Session 2: Pt received sitting in w/c and agreeable to therapy session. Pt noted to have abrasion on L forearm - RN present to address. Cardiology MD in/out to address pt's concerns regarding upcoming surgery. Received and maintained on 2L of O2 via nasal cannula during session except for brief moments of increase to 3L per pt request after activity (pt reports this is what she does at home with activity). Transported to/from gym in w/c for time management and energy conservation. Pt continues to decline use of L ankle brace at this time due to edema in foot/ankle. Sit<>stands using RW with close supervision during session. Gait training 10063fsing RW  with close supervision for safety and 1x standing rest break for breathing -  demonstrates significantly decreased gait speed with reciprocal pattern but decreased B LE step length. Min cuing during session for L hand attention on RW orthotic. Simulated ambulatory car transfer (SUV height) using RW with close supervision and pt able to recall proper sequencing of transfer with min cuing. Therapist reinforced education on performing stair navigation with therapist prior to attempting at her daughter's house. Transported back to room and pt left seated in w/c with needs in reach, lines intact, and RN present.   PT Discharge Precautions/Restrictions Precautions Precautions: Fall Precaution Comments: Left inattention,  O2 dependent at home Restrictions Weight Bearing Restrictions: No Pain Pain Assessment Pain Scale: 0-10 Pain Score: 3 (reports "some" pain in L foot/ankle and R hip) Pain Type: Acute pain Pain Location: Ankle Pain Orientation: Left Pain Descriptors / Indicators: Aching;Discomfort Pain Onset: On-going Pain Intervention(s): Rest;Distraction;Repositioned;Other (Comment)(reinforced education on importance of elevating between therapy sessions) Vision/Perception  Perception Perception: Impaired Inattention/Neglect: Does not attend to left side of body Praxis Praxis: Impaired Praxis Impairment Details: Motor planning(motor planning/problem solving impairments with O2 line management)  Cognition Overall Cognitive Status: Impaired/Different from baseline Arousal/Alertness: Awake/alert Orientation Level: Oriented to person;Oriented to place;Oriented to time;Oriented to situation Attention: Focused;Sustained Focused Attention: Appears intact Sustained Attention: Impaired(though pt hyper verbose becoming internally distracted but pt's family reports this is baseline) Selective Attention: Impaired Problem Solving: Impaired Safety/Judgment: Appears intact Comments: Pt continues to demonstrate decreased selective attention secondary to being distracted with  conversations when working on mobility tasks. Sensation Sensation Light Touch: Impaired Detail Light Touch Impaired Details: Impaired LUE;Impaired LLE Hot/Cold: Not tested Proprioception: Impaired Detail Proprioception Impaired Details: Impaired LLE;Impaired LUE Stereognosis: Not tested Coordination Gross Motor Movements are Fluid and Coordinated: No Fine Motor Movements are Fluid and Coordinated: No Coordination and Movement Description: Pt demonstrates decreased coordination and proprioception in the LUE - she will pull her hand off the walker without any awareness that it has moved. Motor  Motor Motor: Hemiplegia Motor - Discharge Observations: Still with mild left hemiparesis and significant sensory impairment in the LUE and LLE  Mobility Bed Mobility Bed Mobility: Supine to Sit;Sit to Supine Supine to Sit: Independent with assistive device Sit to Supine: Independent with assistive device Transfers Transfers: Sit to Stand;Stand Pivot Transfers;Stand to Sit Sit to Stand: Supervision/Verbal cueing Stand to Sit: Supervision/Verbal cueing Stand Pivot Transfers: Supervision/Verbal cueing Stand Pivot Transfer Details: Verbal cues for technique;Verbal cues for safe use of DME/AE Transfer (Assistive device): Rolling walker Locomotion  Gait Ambulation: Yes Gait Assistance: Supervision/Verbal cueing Gait Distance (Feet): 100 Feet Assistive device: Rolling walker Gait Assistance Details: Verbal cues for safe use of DME/AE;Verbal cues for sequencing;Verbal cues for gait pattern;Verbal cues for precautions/safety Gait Gait: Yes Gait Pattern: Impaired Gait Pattern: Decreased step length - left;Decreased step length - right;Decreased stride length Gait velocity: significantly decreased Stairs / Additional Locomotion Stairs: No  Trunk/Postural Assessment  Cervical Assessment Cervical Assessment: Exceptions to WFL(forward head) Thoracic Assessment Thoracic Assessment: Exceptions to  WFL(thoracic rounding) Lumbar Assessment Lumbar Assessment: Exceptions to WFL(posterior pelvic tilt in sitting) Postural Control Postural Control: Deficits on evaluation Protective Responses: continues to require UE support on RW for balance  Balance Balance Balance Assessed: Yes Static Sitting Balance Static Sitting - Balance Support: Feet supported Static Sitting - Level of Assistance: 7: Independent Dynamic Sitting Balance Dynamic Sitting - Balance Support: During functional activity Dynamic Sitting - Level of Assistance: 5: Stand by assistance Static Standing Balance Static Standing -  Balance Support: During functional activity Static Standing - Level of Assistance: 5: Stand by assistance Dynamic Standing Balance Dynamic Standing - Balance Support: During functional activity Dynamic Standing - Level of Assistance: 5: Stand by assistance Extremity Assessment      RLE Assessment RLE Assessment: Within Functional Limits LLE Assessment LLE Assessment: Exceptions to [facility] General Strength Comments: pain and swelling at the ankle limiting strength/ROM. hip flexion 4/5, quads 4+/5 and hamstrings 4/5    Tawana Scale, PT, DPT 06/03/2019, 7:53 AM

## 2019-06-03 NOTE — Progress Notes (Signed)
VASCULAR SURGERY:  The patient is doing well in her rehab.  She is headed to therapy now.  She is scheduled for transcarotid stenting on 06/20/2019.  She is on aspirin, Plavix, and a statin.  She will stop her Eliquis 48 hours prior to the procedure.  The patient did not have any specific questions related to her surgery but wanted me to touch bases with her daughter [name] [phone]).  I have reviewed these medications with the patient and I will have my office call the week prior to surgery to be sure that she knows to stop her Eliquis 48 hours prior to the procedure.  She will return on 06/20/2019 for elective transcarotid stenting of the right internal carotid artery.  [name], MD Office: [phone]

## 2019-06-03 NOTE — Progress Notes (Addendum)
[facility] PHYSICAL MEDICINE & REHABILITATION PROGRESS NOTE   Subjective/Complaints: Patient complaining of right-sided neck pain.  Also right-sided shoulder pain. In addition she would like to speak to [facility]VS before stenting  ROS: Patient denies CP, SOB, N/V/D Objective:   No results found. No results for input(s): WBC, HGB, HCT, PLT in the last 72 hours. No results for input(s): NA, K, CL, CO2, GLUCOSE, BUN, CREATININE, CALCIUM in the last 72 hours.  Intake/Output Summary (Last 24 hours) at 06/03/2019 0948 Last data filed at 06/02/2019 2130 Gross per 24 hour  Intake 600 ml  Output --  Net 600 ml     Physical Exam: Vital Signs Blood pressure (!) 102/57, pulse 61, temperature 97.8 F (36.6 C), resp. rate 19, height 5\' 4"  (1.626 m), weight 107.8 kg, last menstrual period 01/14/1995, SpO2 95 %.  General: No acute distress Mood and affect are appropriate Heart: Regular rate and rhythm no rubs murmurs or extra sounds Lungs: Clear to auscultation, breathing unlabored, no rales or wheezes Abdomen: Positive bowel sounds, soft nontender to palpation, nondistended Extremities: No clubbing, cyanosis, or edema Skin: No evidence of breakdown, no evidence of rash    3- to 3/5 left delt bi tri grip, 3- HF, 3-to 3/5 KE, 2+ DF  Sensory exam normal sensation to light touch and proprioception in bilateral  lower extremities. Sl decreased LUE.  Cerebellar exam normal finger to nose to finger as well as heel to shin in bilateral upper and lower extremities Musculoskeletal Right paracervical area without tenderness there is tenderness along the insertion of the sternocleidomastoid on the mastoid process.  There is also tenderness along the right trapezius.  Assessment/Plan: 1. 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  Physiatrist is providing close  team supervision and 24 hour management of active medical problems listed below.  Physiatrist and rehab team continue to assess barriers to discharge/monitor patient progress toward functional and medical goals  Care Tool:  Bathing    Body parts bathed by patient: Left arm, Chest, Abdomen, Front perineal area, Right upper leg, Left upper leg, Face, Right lower leg   Body parts bathed by helper: Right arm     Bathing assist Assist Level: Moderate Assistance - Patient 50 - 74%     Upper Body Dressing/Undressing Upper body dressing   What is the patient wearing?: Bra, Pull over shirt    Upper body assist Assist Level: Minimal Assistance - Patient > 75%    Lower Body Dressing/Undressing Lower body dressing      What is the patient wearing?: Pants     Lower body assist Assist for lower body dressing: Minimal Assistance - Patient > 75%     Toileting Toileting    Toileting assist Assist for toileting: Minimal Assistance - Patient > 75%     Transfers Chair/bed transfer  Transfers assist     Chair/bed transfer assist level: Contact Guard/Touching assist Chair/bed transfer assistive device: Programmer, multimedia   Ambulation assist      Assist level: Contact Guard/Touching assist Assistive device: Walker-rolling Max distance: 53ft   Walk 10 feet activity   Assist     Assist level: Contact Guard/Touching assist Assistive device: Walker-rolling   Walk 50 feet activity   Assist Walk 50 feet with 2 turns activity did not occur: Safety/medical concerns(fatigue, poor endurance)  Assist level: Contact Guard/Touching assist Assistive device: Walker-rolling  Walk 150 feet activity   Assist Walk 150 feet activity did not occur: Safety/medical concerns         Walk 10 feet on uneven surface  activity   Assist Walk 10 feet on uneven surfaces activity did not occur: Safety/medical concerns         Wheelchair     Assist Will  patient use wheelchair at discharge?: No             Wheelchair 50 feet with 2 turns activity    Assist            Wheelchair 150 feet activity     Assist          Blood pressure (!) 102/57, pulse 61, temperature 97.8 F (36.6 C), resp. rate 19, height 5\' 4"  (1.626 m), weight 107.8 kg, last menstrual period 01/14/1995, SpO2 95 %.    Medical Problem List and Plan: 1.  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  Antithrombotics: -DVT/anticoagulation:  Pharmaceutical: Other (comment)--Eliquis             -antiplatelet therapy: on DAPT 3. Pain Management: Continue Klonopin for "pain' and seizures.  Myofascial pain, add Sportscreme  5/16: started Gabapentin HS 300mg  for neuropathic pain on left side--seems to have helped --5/17 4. Mood: LCSW to follow for evaluation and support.              -antipsychotic agents: N/A 5. Neuropsych: This patient is capable of making decisions on her own behalf. 6. Skin/Wound Care: Routine pressure measures. Eucerin for dry skin 7. Fluids/Electrolytes/Nutrition: Monitor I/O. Improved 101ml yesterday  8. HTN: Monitor BP tid--continue Capoten, Cardizem and Maxzide.    Vitals:   06/02/19 1957 06/03/19 0556  BP: (!) 143/83 (!) 102/57  Pulse: 85 61  Resp: 20 19  Temp: 97.8 F (36.6 C) 97.8 F (36.6 C)  SpO2: 97% 95%  Some lability but overall in a normal range 9. Seizure: Has been controlled on Klonopin and Keppra bid.   10. Leucocytosis: Monitor for signs of infection.  11. New onset A fib: Monitor HR tid--on Cardizem and Eliquis - rate is controlled  12. Chronic constipation: Managed with Miralax  13. H/o Depression: On Lexapro and Klonopin.  14. 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  Continue Stiolto.  Will add Claritin and nose spray to help with nasal congestion.  Also added Mucinex DM to help with congested cough.  Check chest x-ray due to reports of worsening. Schedule xopenex (since has tachycardia with albuterol) as needed for SOB/wheezing.  15.  Left ankle sprain: Will order ASO.  Ice tid as well as Voltaren gel to Achilles tendon to help with pain., PRAFO at noc  Improved.  16. Sinus pain, improved with Sudafed this may have raised blood pressure yesterday: Will only continue for 2 days as per RN, complaining of sinus pain and already receiving nasal spray and claritin. Meds reviewed and does not appear to be using nasal spray. Will add Mucinex 600mg  BID.   5/16-17: much improved   LOS: 10 days A FACE TO [place] E Tevan Marian 06/03/2019, 9:48 AM

## 2019-06-03 NOTE — Progress Notes (Signed)
Inpatient Rehab Care Coordinator Discharge Note Inpatient Rehabilitation Care Coordinator  Discharge Note  The overall goal for the admission was met for:   Discharge location: Home with spouse and daughter.  Length of Stay: 14 days with discharge 06/06/19 due to medical issues and MD put her on fluid restriction of 1500cc/day.  Discharge activity level: Patient discharging using RW for standing/ambulation at CGA/close supervision level.  Home/community participation: Clinical research associate provided included: MD, RD, PT, OT, SLP, RN, CM, TR, Pharmacy, Neuropsych and SW  Financial Services: Medicare and Other: T[employer]are  Follow-up services arranged: Home Health: PT, OT , DME: 3 n1 and Patient/Family has no preference for HH/DME agencies  Comments (or additional information): [facility]  Daughter requested list of names for hired care providers and was given a list of private duty care agencies.  Patient/Family verbalized understanding of follow-up arrangements: Yes  Individual responsible for coordination of the follow-up plan: Daughter; Wendy:[phone]  Confirmed correct DME delivered from [place]: Margarito Liner 06/03/2019    Margarito Liner-

## 2019-06-03 NOTE — H&P (View-Only) (Signed)
VASCULAR SURGERY:  The patient is doing well in her rehab.  She is headed to therapy now.  She is scheduled for transcarotid stenting on 06/20/2019.  She is on aspirin, Plavix, and a statin.  She will stop her Eliquis 48 hours prior to the procedure.  The patient did not have any specific questions related to her surgery but wanted me to touch bases with her daughter [name] [phone]).  I have reviewed these medications with the patient and I will have my office call the week prior to surgery to be sure that she knows to stop her Eliquis 48 hours prior to the procedure.  She will return on 06/20/2019 for elective transcarotid stenting of the right internal carotid artery.  [name], MD Office: [phone]

## 2019-06-03 NOTE — Discharge Summary (Signed)
Physician Discharge Summary  Patient ID: Beth [name] MRN: [id number] DOB/AGE: 1940/06/09 79 y.o.  Admit date: 05/24/2019 Discharge date: 06/06/2019  Discharge Diagnoses:  Principal Problem:   Acute ischemic right MCA stroke [facility]) Active Problems:   HTN (hypertension)   COPD without exacerbation (HCC)   Seizures (HCC)   Stenosis of right carotid artery   On home O2   Degenerative arthritis of left foot   Left ankle sprain   Discharged Condition: stable    Significant Diagnostic Studies:  DG Chest 2 View  Result Date: 05/25/2019 CLINICAL DATA:  Wheezing for 2 days EXAM: CHEST - 2 VIEW COMPARISON:  July 30, 2015 FINDINGS: The heart size and mediastinal contours are within normal limits. Both lungs are clear. Again noted is mild hyperinflation of the upper lung zones. Calcified granulomata seen within the left upper lung and right lung base. The visualized skeletal structures are unremarkable. IMPRESSION: No active cardiopulmonary disease.  Findings suggestive of COPD. Electronically Signed   By: Prudencio Pair M.D.   On: 05/25/2019 20:13   DG Ankle 2 Views Left  Result Date: 05/22/2019 CLINICAL DATA:  Left ankle pain and swelling EXAM: LEFT ANKLE - 2 VIEW COMPARISON:  None. FINDINGS: There is soft tissue swelling about the ankle. There is no acute displaced fracture. No dislocation. There is a moderate-sized plantar calcaneal spur. There is an Achilles tendon enthesophyte. Degenerative changes are noted of the midfoot. IMPRESSION: Soft tissue swelling without acute fracture or dislocation. Electronically Signed   By: Constance Holster M.D.   On: 05/22/2019 16:56    DG Foot 2 Views Left  Result Date: 05/22/2019 CLINICAL DATA:  Pain and swelling. EXAM: LEFT FOOT - 2 VIEW COMPARISON:  None. FINDINGS: There is nonspecific soft tissue swelling about the foot. There are degenerative changes of the first metatarsophalangeal joint. There is lucency at the head of the first metatarsal which may  be secondary to underlying degenerative changes. There is a moderate-sized plantar calcaneal spur. IMPRESSION: 1. No acute displaced fracture or dislocation. 2. There is nonspecific soft tissue swelling about the foot. Electronically Signed   By: Constance Holster M.D.   On: 05/22/2019 16:57    Labs:  Basic Metabolic Panel: BMP Latest Ref Rng & Units 06/06/2019 06/05/2019 06/04/2019  Glucose 70 - 99 mg/dL 171(H) 124(H) 153(H)  BUN 8 - 23 mg/dL 7(L) 8 9  Creatinine 0.44 - 1.00 mg/dL 0.70 0.70 0.70  Sodium 135 - 145 mmol/L 136 126(L) 128(L)  Potassium 3.5 - 5.1 mmol/L 4.3 4.2 4.2  Chloride 98 - 111 mmol/L 98 93(L) 91(L)  CO2 22 - 32 mmol/L 28 27 27   Calcium 8.9 - 10.3 mg/dL 9.1 8.7(L) 9.3    CBC: CBC Latest Ref Rng & Units 06/06/2019 05/30/2019 05/25/2019  WBC 4.0 - 10.5 K/uL 7.6 9.8 11.0(H)  Hemoglobin 12.0 - 15.0 g/dL 12.1 12.5 11.9(L)  Hematocrit 36.0 - 46.0 % 36.6 36.1 35.7(L)  Platelets 150 - 400 K/uL 291 289 272    CBG: No results for input(s): GLUCAP in the last 168 hours.  Brief HPI:   Beth [name] is a 79 y.o. female with history of HTN, aneurysm rupture with SAH, seizures, COPD with chronic hypoxic respiratory failure--oxygen dependent; who was admitted on 05/16/2019 with sudden onset of left-sided weakness, sensory loss and left neglect.  CTA head/neck was negative for LVO and showed plaque right ICA with 55% stenosis.  MRI brain revealed small acute right MCA infarct involving frontoparietal lesion.  Dr. Rigoberto Noel felt  the stroke was embolic secondary to right ICA stenosis and aspirin/Plavix added.  She did develop A. fib with RVR requiring IV metoprolol for rate control and this was transitioned to Cardizem.  Eliquis was added due to new onset A. Fib.  Dr. Doren Custard was consulted for input on ICA stenosis and plans for transcarotid stenting in the future.  Recommendations are to stop Eliquis 48 hours prior to procedure.  Patient reported onset of left foot pain with edema and ecchymosis  after twisting it during a transfer.    X-rays of ankle and foot was negative for fracture and showed degenerative changes midfoot as well as Achilles tendon enthesophyte. She continued to be limited by left-sided weakness with proprioceptive deficits affecting ADLs and mobility.  CIR was recommended due to functional decline.  Hospital Course: Beth [name] was admitted to rehab 05/24/2019 for inpatient therapies to consist of PT, ST and OT at least three hours five days a week. Past admission physiatrist, therapy team and rehab RN have worked together to provide customized collaborative inpatient rehab.  Due to ongoing left ankle pain due to strain as well as degenerative changes; ASO was ordered for support with ice for local measures and Voltren gel to address achilles tendinitis.  PRAFO was used at nights and this has help ameliorate her symptoms. She was maintained on DAPT with serial CBC showing H/H/Plts to be stable. She continued to have issues with wheezing and CXR done was negative for acute process. She was instructed on importance of pulmonary hygiene and use of IS.  Respiratory status has been stable with scheduled home MDI and endurance has been improving.  Gabapentin was added to help with left sided neuropathic pain.   Dr. Scot Dock has followed up with patient to answer questions regarding procedure and recommended stopping Eliquis 48 hours prior to carotid stenting which is set for 06/07. Her blood pressures were monitored on TID basis and have been tightly controlled on home regimen. She developed progressive drop in sodium levels to 124 therefore Maxzide was discontinued and she was placed on fluid restriction of 1500 cc/day. Her length of stay was extended to 5/24 and follow up labs showed that hyponatremia had resolved. She was advised to continue to monitor fluid intake and recommend repeat check of lytes in 1-2 weeks to monitor for stability. She has made gains during rehab stay and is  currently at CGA/close supervision level. She will continue to receive follow up HHPT and Rosa Sanchez by [facility] after discharge   Rehab course: During patient's stay in rehab weekly team conferences were held to monitor patient's progress, set goals and discuss barriers to discharge. At admission, patient required mod assist with mobility and ADL tasks.  She exhibited mild higher level cognitive deficits affecting complex problem solving, recall of new information and selective attention. She has had improvement in activity tolerance, balance, postural control as well as ability to compensate for deficits. She has had improvement in functional use LUE  and LLE as well as improvement in awareness. She is able to complete UB bathing/ dressing tasks with supervision and requires min assist with LB ADL tasks. She requires close supervision with transfers and to ambulate 100' with RW.  She is able to complete complex tasks at modified independent levels and has had improvement in attention to tasks as well as use of compensatory strategies for memory. Family education was completed with husband and daughter regarding all aspects of safety and care.    Discharge  disposition: 01-Home or Self Care  Diet: Heart Healthy with 1500 cc FR/day  Special Instructions: 1. No driving or strenuous activity till cleared by MD. 2. Recommend repeat BMET in 7-10 days.   Allergies as of 06/06/2019      Reactions   Prozac [fluoxetine Hcl] Other (See Comments)   seizures   Sulfa Antibiotics Nausea And Vomiting, Other (See Comments)   TINGLING IN LEGS ALSO   Micardis [telmisartan] Swelling, Other (See Comments)   Swelling, bruising Swelling, bruising   Prednisone Other (See Comments)   mood changes Mood changes   Lisinopril-hydrochlorothiazide Other (See Comments)   unknown   Augmentin [amoxicillin-pot Clavulanate] Rash   Avelox [moxifloxacin Hcl In Nacl] Rash   LEVAQUIN IS OK-SEE NOTE   Norvasc  [amlodipine Besylate] Other (See Comments)   fatigue      Medication List    STOP taking these medications   triamterene-hydrochlorothiazide 37.5-25 MG tablet Commonly known as: MAXZIDE-25     TAKE these medications   acetaminophen 325 MG tablet Commonly known as: TYLENOL Take 1-2 tablets (325-650 mg total) by mouth every 4 (four) hours as needed for mild pain.   albuterol 108 (90 Base) MCG/ACT inhaler Commonly known as: VENTOLIN HFA Inhale 2 puffs into the lungs every 4 (four) hours as needed for wheezing or shortness of breath.   apixaban 5 MG Tabs tablet Commonly known as: ELIQUIS Take 1 tablet (5 mg total) by mouth 2 (two) times daily.   captopril 50 MG tablet Commonly known as: CAPOTEN Take 50 mg by mouth 2 (two) times daily.   clonazePAM 2 MG tablet Commonly known as: KLONOPIN TAKE 1 TABLET AT BEDTIME What changed: additional instructions   clopidogrel 75 MG tablet Commonly known as: PLAVIX Take 1 tablet (75 mg total) by mouth daily.   diltiazem 300 MG 24 hr capsule Commonly known as: CARDIZEM CD Take 1 capsule (300 mg total) by mouth daily.   famotidine 20 MG tablet Commonly known as: Pepcid Take 1 tablet (20 mg total) by mouth 2 (two) times daily.   Flonase 50 MCG/ACT nasal spray Generic drug: fluticasone Place 1 spray into the nose daily as needed for allergies. Spray twice daily as needed   gabapentin 600 MG tablet Commonly known as: NEURONTIN Take 0.5 tablets (300 mg total) by mouth at bedtime. Notes to patient: For nerve pain/neuropathy   hydrocerin Crea Apply 1 application topically 2 (two) times daily. Notes to patient: For dry skin   levETIRAcetam 750 MG Tb3d Take 750 mg by mouth 2 (two) times daily.   Lexapro 20 MG tablet Generic drug: escitalopram Take 1 tablet (20 mg total) by mouth daily. Morning dosage   loratadine 10 MG tablet Commonly known as: CLARITIN Take 1 tablet (10 mg total) by mouth daily.   Muscle Rub 10-15 %  Crea Apply 1 application topically as needed for muscle pain. To neck and shoulder   polyethylene glycol 17 g packet Commonly known as: MIRALAX / GLYCOLAX Take 17 g by mouth daily. Purchase bottle over the counter and use capful daily   sodium chloride 0.65 % Soln nasal spray Commonly known as: OCEAN Place 1 spray into both nostrils in the morning, at noon, in the evening, and at bedtime.   vitamin B-12 500 MCG tablet Commonly known as: CYANOCOBALAMIN Take 1 tablet (500 mcg total) by mouth daily.   Vitamin D3 50 MCG (2000 UT) capsule Take 2,000 Units by mouth daily.      Follow-up Information  Kirsteins, Luanna Salk, MD Follow up.   Specialty: Physical Medicine and Rehabilitation Why: Office will call you with follow up appointment Contact information: [place] [address] [phone]        [place]. Call.   Why: for post stroke appointment Contact information: [address]     [place] [phone]       Kathyrn Lass, MD. Call.   Specialty: Family Medicine Why: for follow up appointment Contact information: [place] [address] [phone]        Angelia Mould, MD Follow up.   Specialties: Vascular Surgery, Cardiology Why: Office will call you a week prior to surgery to confirm details.  Contact information: [address] [place] [address] [phone]           Signed: Bary Leriche 06/14/2019, 10:31 PM

## 2019-06-03 NOTE — Progress Notes (Signed)
Discussed drop in sodium levels with patient and daughter. Maxzide discontinued as likely culprit and BP have been running low. She did get dose this am and discussed that it may take up to 48 hours to see improvement. Discharge will have to be held till Sun/Monday pending recovery/labwork.

## 2019-06-03 NOTE — Progress Notes (Signed)
Speech Language Pathology Discharge Summary  Patient Details  Name: Beth [name] MRN: [id number] Date of Birth: 11-13-1940  Today's Date: 06/03/2019 SLP Individual Time: 1300-1345 SLP Individual Time Calculation (min): 45 min   Skilled Therapeutic Interventions:  Pt was seen for skilled ST targeting cognitive goals.  SLP facilitated the session with a complex scheduling task to address problem solving goals and attention goals.  Pt initially needed supervision cues for task organization as well as to recognize and correct errors; however, as task progressed therapist was able to fade cues to mod I.  Pt would intermittently lose focus from task but would redirect herself without cues.  Pt was left in wheelchair with call bell within reach.  Pt is ready for discharge tomorrow.     Patient has met 4 of 4 long term goals.  Patient to discharge at overall Modified Independent level.  Reasons goals not met:     Clinical Impression/Discharge Summary:   Pt has made excellent gains while inpatient and is discharging having met 4 out of 4 long term goals.  Pt is currently mod I for tasks.  Pt/family education is complete at this time.  Pt has demonstrated improved attention to tasks, use of memory compensatory strategies, and complex problem solving.  Pt is discharging home with recommendations for 24/7 supervision.    Care Partner:  Caregiver Able to Provide Assistance: Yes  Type of Caregiver Assistance: Physical;Cognitive  Recommendation:  24 hour supervision/assistance      Equipment: none recommended by SLP   Reasons for discharge: Discharged from hospital   Patient/Family Agrees with Progress Made and Goals Achieved: Yes    Micaella Gitto, Selinda Orion 06/03/2019, 12:50 PM

## 2019-06-03 NOTE — Plan of Care (Signed)
  Problem: Consults Goal: RH STROKE PATIENT EDUCATION Description: See Patient Education module for education specifics  Outcome: Progressing   Problem: RH KNOWLEDGE DEFICIT Goal: RH STG INCREASE KNOWLEDGE OF HYPERTENSION Description: Pt will demonstrate knowledge of blood pressure management including diet, medications, and activity at discharge with cues/reminders to assist and use of handout/books, etc.  Outcome: Progressing Goal: RH STG INCREASE KNOWLEGDE OF HYPERLIPIDEMIA Description: Pt will demonstrate knowledge of cholesterol management including diet and medications for stroke prevention at discharge with cues/reminders to assist and use of handouts, books, etc Outcome: Progressing Goal: RH STG INCREASE KNOWLEDGE OF STROKE PROPHYLAXIS Description: Pt will be able to verbalize personal risk factors for stroke and prevention methods at the time of discharge with cues and reminders to use handouts and books Outcome: Progressing

## 2019-06-04 LAB — BASIC METABOLIC PANEL
Anion gap: 10 (ref 5–15)
BUN: 9 mg/dL (ref 8–23)
CO2: 27 mmol/L (ref 22–32)
Calcium: 9.3 mg/dL (ref 8.9–10.3)
Chloride: 91 mmol/L — ABNORMAL LOW (ref 98–111)
Creatinine, Ser: 0.7 mg/dL (ref 0.44–1.00)
GFR calc Af Amer: >60 mL/min (ref >60)
GFR calc non Af Amer: >60 mL/min (ref >60)
Glucose, Bld: 153 mg/dL — ABNORMAL HIGH (ref 70–99)
Potassium: 4.2 mmol/L (ref 3.5–5.1)
Sodium: 128 mmol/L — ABNORMAL LOW (ref 135–145)

## 2019-06-04 NOTE — Progress Notes (Signed)
PHYSICAL MEDICINE & REHABILITATION PROGRESS NOTE   Subjective/Complaints:   Pt reports she knows she cannot leave today- maybe Sunday or Monday due to low Sodium- she is "OK' with this.  No other complaints.   ROS: Pt denies SOB, abd pain, CP, N/V/C/D, and vision changes  Objective:   No results found. No results for input(s): WBC, HGB, HCT, PLT in the last 72 hours. Recent Labs    06/03/19 1018 06/04/19 0816  NA 124* 128*  K 4.8 4.2  CL 87* 91*  CO2 28 27  GLUCOSE 111* 153*  BUN 8 9  CREATININE 0.69 0.70  CALCIUM 8.9 9.3    Intake/Output Summary (Last 24 hours) at 06/04/2019 1650 Last data filed at 06/04/2019 1416 Gross per 24 hour  Intake 960 ml  Output --  Net 960 ml     Physical Exam: Vital Signs Blood pressure 133/61, pulse 72, temperature 98.2 F (36.8 C), resp. rate 18, height 5\' 4"  (1.626 m), weight 107.8 kg, last menstrual period 01/14/1995, SpO2 97 %.  General: No acute distress- sitting up in bed; appropriate, talkative, NAD Mood and affect are appropriate Heart: RRR, no MR/G, no JVD Lungs:  Pt denies SOB, abd pain, CP, N/V/C/D, and vision changes Abdomen: Soft, NT, ND, (+)BS  Extremities: No clubbing, cyanosis, or edema Skin: No evidence of breakdown, no evidence of rash   3- to 3/5 left delt bi tri grip, 3- HF, 3-to 3/5 KE, 2+ DF  Sensory exam normal sensation to light touch and proprioception in bilateral  lower extremities. Sl decreased LUE.  Cerebellar exam normal finger to nose to finger as well as heel to shin in bilateral upper and lower extremities Musculoskeletal Right paracervical area without tenderness there is tenderness along the insertion of the sternocleidomastoid on the mastoid process.  There is also tenderness along the right trapezius.  Assessment/Plan: 1. 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  Physiatrist is providing close team supervision and 24 hour management of active medical problems listed below.  Physiatrist and rehab team continue to assess barriers to discharge/monitor patient progress toward functional and medical goals  Care Tool:  Bathing    Body parts bathed by patient: Left arm, Chest, Abdomen, Front perineal area, Right upper leg, Left upper leg, Face, Right lower leg, Left lower leg, Right arm, Buttocks   Body parts bathed by helper: Right arm     Bathing assist Assist Level: Supervision/Verbal cueing     Upper Body Dressing/Undressing Upper body dressing   What is the patient wearing?: Bra, Pull over shirt    Upper body assist Assist Level: Supervision/Verbal cueing    Lower Body Dressing/Undressing Lower body dressing      What is the patient wearing?: Pants, Underwear/pull up     Lower body assist Assist for lower body dressing: Minimal Assistance - Patient > 75%     Toileting Toileting    Toileting assist Assist for toileting: Supervision/Verbal cueing     Transfers Chair/bed transfer  Transfers assist     Chair/bed transfer assist level: Supervision/Verbal cueing Chair/bed transfer assistive device: Programmer, multimedia   Ambulation assist      Assist level: Supervision/Verbal cueing Assistive device: Walker-rolling Max distance: 168ft   Walk 10 feet activity   Assist     Assist level: Supervision/Verbal cueing Assistive device: [employer]  50 feet activity   Assist Walk 50 feet with 2 turns activity did not occur: Safety/medical concerns(fatigue, poor endurance)  Assist level: Supervision/Verbal cueing Assistive device: Walker-rolling    Walk 150 feet activity   Assist Walk 150 feet activity did not occur: Safety/medical concerns         Walk 10 feet on uneven surface  activity   Assist Walk 10 feet on uneven surfaces activity did not occur:  (based on clinical judgement)   Assist level: Minimal Assistance - Patient > 75% Assistive device: Aeronautical engineer Will patient use wheelchair at discharge?: No             Wheelchair 50 feet with 2 turns activity    Assist            Wheelchair 150 feet activity     Assist          Blood pressure 133/61, pulse 72, temperature 98.2 F (36.8 C), resp. rate 18, height 5\' 4"  (1.626 m), weight 107.8 kg, last menstrual period 01/14/1995, SpO2 97 %.    Medical Problem List and Plan: 1.  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  Antithrombotics: -DVT/anticoagulation:  Pharmaceutical: Other (comment)--Eliquis             -antiplatelet therapy: on DAPT 3. Pain Management: Continue Klonopin for "pain' and seizures.  Myofascial pain, add Sportscreme  5/16: started Gabapentin HS 300mg  for neuropathic pain on left side--seems to have helped --5/17 4. Mood: LCSW to follow for evaluation and support.              -antipsychotic agents: N/A 5. Neuropsych: This patient is capable of making decisions on her own behalf. 6. Skin/Wound Care: Routine pressure measures. Eucerin for dry skin 7. Fluids/Electrolytes/Nutrition: Monitor I/O. Improved 1088ml yesterday  8. HTN: Monitor BP tid--continue Capoten, Cardizem and Maxzide.    Vitals:   06/04/19 0622 06/04/19 1424  BP: (!) 155/83 133/61  Pulse: 77 72  Resp: 18 18  Temp: (!) 97.4 F (36.3 C) 98.2 F (36.8 C)  SpO2: 95% 97%   5/22- BP normal to slightly elevated 155/83 max- con't regimen 9. Seizure: Has been controlled on Klonopin and Keppra bid.   10. Leucocytosis: Monitor for signs of infection.  11. New onset A fib: Monitor HR tid--on Cardizem and Eliquis - rate is controlled   12. Chronic constipation: Managed with Miralax   13. H/o Depression: On Lexapro and Klonopin.  14. 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  Continue Stiolto. Will add Claritin and nose spray to help with nasal congestion.  Also added Mucinex DM to help with congested cough.  Check chest x-ray due to reports of worsening. Schedule xopenex (since has tachycardia with albuterol) as needed for SOB/wheezing.  15.  Left ankle sprain: Will order ASO.  Ice tid as well as Voltaren gel to Achilles tendon to help with pain., PRAFO at noc  Improved.  16. Sinus pain, improved with Sudafed this may have raised blood pressure yesterday: Will only continue for 2 days as per RN, complaining of sinus pain and already receiving nasal spray and claritin. Meds reviewed and  does not appear to be using nasal spray. Will add Mucinex 600mg  BID.   5/16-17: much improved 17. Hyponatremia  5/22- Na was 124 yesterday afternoon- is 128 today- if up more tomorrow to 130 or above, will d/c pt.    LOS: 11 days A FACE TO FACE EVALUATION WAS PERFORMED  Rachelle Edwards 06/04/2019, 4:50 PM

## 2019-06-05 LAB — BASIC METABOLIC PANEL
Anion gap: 6 (ref 5–15)
BUN: 8 mg/dL (ref 8–23)
CO2: 27 mmol/L (ref 22–32)
Calcium: 8.7 mg/dL — ABNORMAL LOW (ref 8.9–10.3)
Chloride: 93 mmol/L — ABNORMAL LOW (ref 98–111)
Creatinine, Ser: 0.7 mg/dL (ref 0.44–1.00)
GFR calc Af Amer: >60 mL/min (ref >60)
GFR calc non Af Amer: >60 mL/min (ref >60)
Glucose, Bld: 124 mg/dL — ABNORMAL HIGH (ref 70–99)
Potassium: 4.2 mmol/L (ref 3.5–5.1)
Sodium: 126 mmol/L — ABNORMAL LOW (ref 135–145)

## 2019-06-05 NOTE — Progress Notes (Signed)
[facility] PHYSICAL MEDICINE & REHABILITATION PROGRESS NOTE   Subjective/Complaints:   Pt reports she is hopeful sodium is doing good- exlained to her AND husband in room, it's actually dropped to 126- likely because it sounds like she's drinking a LOT of water.   Have advised pt we are going to put her on fluid restriction of 1500cc/day and suggest other methods for handling dry mouth. .   ROS:  Pt denies SOB, abd pain, CP, N/V/C/D, and vision changes   Objective:   No results found. No results for input(s): WBC, HGB, HCT, PLT in the last 72 hours. Recent Labs    06/04/19 0816 06/05/19 0224  NA 128* 126*  K 4.2 4.2  CL 91* 93*  CO2 27 27  GLUCOSE 153* 124*  BUN 9 8  CREATININE 0.70 0.70  CALCIUM 9.3 8.7*    Intake/Output Summary (Last 24 hours) at 06/05/2019 1513 Last data filed at 06/05/2019 1314 Gross per 24 hour  Intake 420 ml  Output --  Net 420 ml     Physical Exam: Vital Signs Blood pressure (!) 136/94, pulse 69, temperature 97.8 F (36.6 C), resp. rate 14, height 5\' 4"  (1.626 m), weight 107.8 kg, last menstrual period 01/14/1995, SpO2 98 %.  General: sitting up in bed; talkative; husband at bedside, NAD Mood and affect are appropriate Heart: RRR Lungs: CTA B/L- no W/R/R- good air movement Abdomen: Soft, NT, ND, (+)BS  Extremities: No clubbing, cyanosis, or edema Skin: No evidence of breakdown, no evidence of rash   3- to 3/5 left delt bi tri grip, 3- HF, 3-to 3/5 KE, 2+ DF  Sensory exam normal sensation to light touch and proprioception in bilateral  lower extremities. Sl decreased LUE.  Cerebellar exam normal finger to nose to finger as well as heel to shin in bilateral upper and lower extremities Musculoskeletal Right paracervical area without tenderness there is tenderness along the insertion of the sternocleidomastoid on the mastoid process.  There is also tenderness along the right trapezius.  Assessment/Plan: 1. 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  Physiatrist is providing close team supervision and 24 hour management of active medical problems listed below.  Physiatrist and rehab team continue to assess barriers to discharge/monitor patient progress toward functional and medical goals  Care Tool:  Bathing    Body parts bathed by patient: Left arm, Chest, Abdomen, Front perineal area, Right upper leg, Left upper leg, Face, Right lower leg, Left lower leg, Right arm, Buttocks   Body parts bathed by helper: Right arm     Bathing assist Assist Level: Supervision/Verbal cueing     Upper Body Dressing/Undressing Upper body dressing   What is the patient wearing?: Bra, Pull over shirt    Upper body assist Assist Level: Supervision/Verbal cueing    Lower Body Dressing/Undressing Lower body dressing      What is the patient wearing?: Pants, Underwear/pull up     Lower body assist Assist for lower body dressing: Minimal Assistance - Patient > 75%     Toileting Toileting    Toileting assist Assist for toileting: Supervision/Verbal cueing     Transfers Chair/bed transfer  Transfers assist     Chair/bed transfer assist level: Supervision/Verbal cueing Chair/bed transfer assistive device: Programmer, multimedia   Ambulation assist      Assist level: Supervision/Verbal cueing Assistive device: Walker-rolling  Max distance: 11ft   Walk 10 feet activity   Assist     Assist level: Supervision/Verbal cueing Assistive device: Walker-rolling   Walk 50 feet activity   Assist Walk 50 feet with 2 turns activity did not occur: Safety/medical concerns(fatigue, poor endurance)  Assist level: Supervision/Verbal cueing Assistive device: Walker-rolling    Walk 150 feet activity   Assist Walk 150 feet activity did not occur: Safety/medical  concerns         Walk 10 feet on uneven surface  activity   Assist Walk 10 feet on uneven surfaces activity did not occur: (based on clinical judgement)   Assist level: Minimal Assistance - Patient > 75% Assistive device: Aeronautical engineer Will patient use wheelchair at discharge?: No             Wheelchair 50 feet with 2 turns activity    Assist            Wheelchair 150 feet activity     Assist          Blood pressure (!) 136/94, pulse 69, temperature 97.8 F (36.6 C), resp. rate 14, height 5\' 4"  (1.626 m), weight 107.8 kg, last menstrual period 01/14/1995, SpO2 98 %.    Medical Problem List and Plan: 1.  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  Antithrombotics: -DVT/anticoagulation:  Pharmaceutical: Other (comment)--Eliquis             -antiplatelet therapy: on DAPT 3. Pain Management: Continue Klonopin for "pain' and seizures.  Myofascial pain, add Sportscreme  5/16: started Gabapentin HS 300mg  for neuropathic pain on left side--seems to have helped --5/17 4. Mood: LCSW to follow for evaluation and support.              -antipsychotic agents: N/A 5. Neuropsych: This patient is capable of making decisions on her own behalf. 6. Skin/Wound Care: Routine pressure measures. Eucerin for dry skin 7. Fluids/Electrolytes/Nutrition: Monitor I/O. Improved 1028ml yesterday  8. HTN: Monitor BP tid--continue Capoten, Cardizem and Maxzide.    Vitals:   06/05/19 0811 06/05/19 1500  BP:  (!) 136/94  Pulse:  69  Resp:  14  Temp:  97.8 F (36.6 C)  SpO2: 98% 98%   5/23- BP well controlled- con't regimen 9. Seizure: Has been controlled on Klonopin and Keppra bid.   10. Leucocytosis: Monitor for signs of infection.  11. New onset A fib: Monitor HR tid--on Cardizem  and Eliquis -  5/23- rate controlled 12. Chronic constipation: Managed with Miralax  13. H/o Depression: On Lexapro and Klonopin.  14. 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  Continue Stiolto. Will add Claritin and nose spray to help with nasal congestion.  Also added Mucinex DM to help with congested cough.  Check chest x-ray due to reports of worsening. Schedule xopenex (since has tachycardia with albuterol) as needed for SOB/wheezing.  15.  Left ankle sprain: Will order ASO.  Ice tid as well as Voltaren gel to Achilles tendon to help with pain., PRAFO at noc  Improved.  16. Sinus pain, improved with Sudafed this may have raised blood pressure yesterday: Will only continue for 2  days as per RN, complaining of sinus pain and already receiving nasal spray and claritin. Meds reviewed and does not appear to be using nasal spray. Will add Mucinex 600mg  BID.   5/16-17: much improved 17. Hyponatremia  5/22- Na was 124 yesterday afternoon- is 128 today- if up more tomorrow to 130 or above, will d/c pt.   5/23- Na down to 126- doesn't want sodium since so dry mouthed- will put on fluid restriction of 1500cc/day and recheck in AM- if up to 130- can d/c.    LOS: 12 days A FACE TO FACE EVALUATION WAS PERFORMED  Ereka Brau 06/05/2019, 3:13 PM

## 2019-06-06 LAB — BASIC METABOLIC PANEL
Anion gap: 10 (ref 5–15)
BUN: 7 mg/dL — ABNORMAL LOW (ref 8–23)
CO2: 28 mmol/L (ref 22–32)
Calcium: 9.1 mg/dL (ref 8.9–10.3)
Chloride: 98 mmol/L (ref 98–111)
Creatinine, Ser: 0.7 mg/dL (ref 0.44–1.00)
GFR calc Af Amer: >60 mL/min (ref >60)
GFR calc non Af Amer: >60 mL/min (ref >60)
Glucose, Bld: 171 mg/dL — ABNORMAL HIGH (ref 70–99)
Potassium: 4.3 mmol/L (ref 3.5–5.1)
Sodium: 136 mmol/L (ref 135–145)

## 2019-06-06 LAB — CBC
HCT: 36.6 % (ref 36.0–46.0)
Hemoglobin: 12.1 g/dL (ref 12.0–15.0)
MCH: 32.6 pg (ref 26.0–34.0)
MCHC: 33.1 g/dL (ref 30.0–36.0)
MCV: 98.7 fL (ref 80.0–100.0)
Platelets: 291 10*3/uL (ref 150–400)
RBC: 3.71 MIL/uL — ABNORMAL LOW (ref 3.87–5.11)
RDW: 12.6 % (ref 11.5–15.5)
WBC: 7.6 10*3/uL (ref 4.0–10.5)
nRBC: 0 % (ref 0.0–0.2)

## 2019-06-06 NOTE — Progress Notes (Signed)
[facility] PHYSICAL MEDICINE & REHABILITATION PROGRESS NOTE   Subjective/Complaints:   Discussed fluid intake   ROS:  Pt denies SOB, abd pain, CP, N/V/C/D, and vision changes   Objective:   No results found. Recent Labs    06/06/19 0750  WBC 7.6  HGB 12.1  HCT 36.6  PLT 291   Recent Labs    06/05/19 0224 06/06/19 0750  NA 126* 136  K 4.2 4.3  CL 93* 98  CO2 27 28  GLUCOSE 124* 171*  BUN 8 7*  CREATININE 0.70 0.70  CALCIUM 8.7* 9.1    Intake/Output Summary (Last 24 hours) at 06/06/2019 0929 Last data filed at 06/06/2019 0720 Gross per 24 hour  Intake 420 ml  Output --  Net 420 ml     Physical Exam: Vital Signs Blood pressure (!) 119/59, pulse 67, temperature 97.8 F (36.6 C), resp. rate 18, height 5\' 4"  (1.626 m), weight 107.8 kg, last menstrual period 01/14/1995, SpO2 96 %.  General: No acute distress Mood and affect are appropriate Heart: Regular rate and rhythm no rubs murmurs or extra sounds Lungs: Clear to auscultation, breathing unlabored, no rales or wheezes Abdomen: Positive bowel sounds, soft nontender to palpation, nondistended Extremities: No clubbing, cyanosis, or edema Skin: No evidence of breakdown, no evidence of rash  Skin: No evidence of breakdown, no evidence of rash   3- to 3/5 left delt bi tri grip, 3- HF, 3-to 3/5 KE, 3- DF    Assessment/Plan:  1. 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 stenosis  Stable for D/C today F/u PCP in 3-4 weeks F/u PM&R 2 weeks See D/C summary  See D/C instructions  Care Tool:  Bathing    Body parts bathed by patient: Left arm, Chest, Abdomen, Front perineal area, Right upper leg, Left upper leg, Face, Right lower leg, Left lower leg, Right arm, Buttocks   Body parts bathed by helper: Right arm     Bathing assist Assist Level: Supervision/Verbal cueing     Upper Body Dressing/Undressing Upper body dressing   What is the patient wearing?: Bra, Pull  over shirt    Upper body assist Assist Level: Supervision/Verbal cueing    Lower Body Dressing/Undressing Lower body dressing      What is the patient wearing?: Pants, Underwear/pull up     Lower body assist Assist for lower body dressing: Minimal Assistance - Patient > 75%     Toileting Toileting    Toileting assist Assist for toileting: Supervision/Verbal cueing     Transfers Chair/bed transfer  Transfers assist     Chair/bed transfer assist level: Supervision/Verbal cueing Chair/bed transfer assistive device: Programmer, multimedia   Ambulation assist      Assist level: Supervision/Verbal cueing Assistive device: Walker-rolling Max distance: 111ft   Walk 10 feet activity   Assist     Assist level: Supervision/Verbal cueing Assistive device: Walker-rolling   Walk 50 feet activity   Assist Walk 50 feet with 2 turns activity did not occur: Safety/medical concerns(fatigue, poor endurance)  Assist level: Supervision/Verbal cueing Assistive device: Walker-rolling    Walk 150 feet activity   Assist Walk 150 feet activity did not occur: Safety/medical concerns         Walk 10 feet on uneven surface  activity   Assist Walk 10 feet on uneven surfaces activity did not occur: (based on clinical judgement)   Assist level: Minimal Assistance - Patient > 75% Assistive device: Walker-rolling  Wheelchair     Assist Will patient use wheelchair at discharge?: No             Wheelchair 50 feet with 2 turns activity    Assist            Wheelchair 150 feet activity     Assist          Blood pressure (!) 119/59, pulse 67, temperature 97.8 F (36.6 C), resp. rate 18, height 5\' 4"  (1.626 m), weight 107.8 kg, last menstrual period 01/14/1995, SpO2 96 %.    Medical Problem List and Plan: 1.  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  Antithrombotics: -DVT/anticoagulation:  Pharmaceutical: Other (comment)--Eliquis             -antiplatelet therapy: on DAPT 3. Pain Management: Continue Klonopin for "pain' and seizures.  Myofascial pain, add Sportscreme  5/16: started Gabapentin HS 300mg  for neuropathic pain on left side--seems to have helped --5/17 4. Mood: LCSW to follow for evaluation and support.              -antipsychotic agents: N/A 5. Neuropsych: This patient is capable of making decisions on her own behalf. 6. Skin/Wound Care: Routine pressure measures. Eucerin for dry skin 7. Fluids/Electrolytes/Nutrition: Monitor I/O. Improved 1040ml yesterday  8. HTN: Monitor BP tid--continue Capoten, Cardizem and Maxzide.    Vitals:   06/05/19 2030 06/06/19 0533  BP: (!) 128/57 (!) 119/59  Pulse: 76 67  Resp: 18 18  Temp: 98.2 F (36.8 C) 97.8 F (36.6 C)  SpO2: 97% 96%   5/23- BP well controlled- con't regimen 9. Seizure: Has been controlled on Klonopin and Keppra bid.   10. Leucocytosis: Monitor for signs of infection.  11. New onset A fib: Monitor HR tid--on Cardizem and Eliquis -  5/23- rate controlled 12. Chronic constipation: Managed with Miralax  13. H/o Depression: On Lexapro and Klonopin.  14. 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  Continue Stiolto. Will add Claritin and nose spray to help with nasal congestion.  Also added Mucinex DM to help with congested cough.  Check chest x-ray due to reports of worsening. Schedule xopenex (since has tachycardia with albuterol) as needed for SOB/wheezing.  15.  Left ankle sprain: Will order ASO.  Ice tid as well as Voltaren gel to Achilles tendon to help with pain., PRAFO at noc  Improved.  16. Sinus pain,: much improved 17. Hyponatremia  Resolved Na+ 136 this am  Off maxide , BPs ok,  rec 1581ml fluid per day until PCP f/u    LOS: 13 days A FACE TO [place]  Beth [name] 06/06/2019, 9:29 AM

## 2019-06-06 NOTE — Plan of Care (Signed)
Mod assist with adls 

## 2019-06-06 NOTE — Progress Notes (Signed)
Transported tp lobby per wc in no obvious distress. 2L/min O2 per [place] with postable tank. Transferred to vehicle with limited difficulty.DCd to care of spouse.

## 2019-06-06 NOTE — Discharge Instructions (Signed)
Inpatient Rehab Discharge Instructions  Beth [name] Discharge date and time:    Activities/Precautions/ Functional Status: Activity: no lifting, driving, or strenuous exercise till cleared by MD Diet: low fat 1500 ml fluid restriction Wound Care: none needed.   Functional status:  ___ No restrictions     ___ Walk up steps independently _X__ 24/7 supervision/assistance   ___ Walk up steps with assistance ___ Intermittent supervision/assistance  ___ Bathe/dress independently ___ Walk with walker     ___ Bathe/dress with assistance ___ Walk Independently    ___ Shower independently ___ Walk with assistance    ___ Shower with assistance __X_ No alcohol __X_ Return to work/school    COMMUNITY REFERRALS UPON DISCHARGE:    Home Health:   PT   OT                        Agency: [facility]  Phone: [phone]    Medical Equipment/Items Ordered: Bedside Commode                                                   Agency/Supplier: [facility]  Special Instructions: 1. NO driving till cleared by MD. 2. Stop taking Eliquis 48 hours prior to surgery.    STROKE/TIA DISCHARGE INSTRUCTIONS SMOKING Cigarette smoking nearly doubles your risk of having a stroke & is the single most alterable risk factor  If you smoke or have smoked in the last 12 months, you are advised to quit smoking for your health.  Most of the excess cardiovascular risk related to smoking disappears within a year of stopping.  Ask you doctor about anti-smoking medications  [place] Quit Line: 1-800-QUIT NOW  Free Smoking Cessation Classes [phone]  CHOLESTEROL Know your levels; limit fat & cholesterol in your diet  Lipid Panel     Component Value Date/Time   CHOL 151 05/17/2019 0346   TRIG 172 (H) 05/17/2019 0346   HDL 39 (L) 05/17/2019 0346   CHOLHDL 3.9 05/17/2019 0346   VLDL 34 05/17/2019 0346   LDLCALC 78 05/17/2019 0346      Many patients benefit from treatment even if their cholesterol is at  goal.  Goal: Total Cholesterol (CHOL) less than 160  Goal:  Triglycerides (TRIG) less than 150  Goal:  HDL greater than 40  Goal:  LDL (LDLCALC) less than 100   BLOOD PRESSURE American Stroke Association blood pressure target is less that 120/80 mm/Hg  Your discharge blood pressure is:  BP: (!) 153/71  Monitor your blood pressure  Limit your salt and alcohol intake  Many individuals will require more than one medication for high blood pressure  DIABETES (A1c is a blood sugar average for last 3 months) Goal HGBA1c is under 7% (HBGA1c is blood sugar average for last 3 months)  Diabetes: Pre-diabetes    Lab Results  Component Value Date   HGBA1C 6.0 (H) 05/17/2019     Your HGBA1c can be lowered with medications, healthy diet, and exercise.  Check your blood sugar as directed by your physician  Call your physician if you experience unexplained or low blood sugars.  PHYSICAL ACTIVITY/REHABILITATION Goal is 30 minutes at least 4 days per week  Activity: No driving, Therapies: see above Return to work: N/A  Activity decreases your risk of heart attack and stroke and makes your heart  stronger.  It helps control your weight and blood pressure; helps you relax and can improve your mood.  Participate in a regular exercise program.  Talk with your doctor about the best form of exercise for you (dancing, walking, swimming, cycling).  DIET/WEIGHT Goal is to maintain a healthy weight  Your discharge diet is:  Diet Order            Diet Heart Room service appropriate? Yes; Fluid consistency: Thin  Diet effective now             liquids Your height is:  5'4" Your current weight is: 237 lbs Your Body Mass Index (BMI) is:  40.76  Following the type of diet specifically designed for you will help prevent another stroke.  Your goal weight is:  147 lbs  Your goal Body Mass Index (BMI) is 19-24.  Healthy food habits can help reduce 3 risk factors for stroke:  High cholesterol,  hypertension, and excess weight.  RESOURCES Stroke/Support Group:  Call [phone]   STROKE EDUCATION PROVIDED/REVIEWED AND GIVEN TO PATIENT Stroke warning signs and symptoms How to activate emergency medical system (call 911). Medications prescribed at discharge. Need for follow-up after discharge. Personal risk factors for stroke. Pneumonia vaccine given:  Flu vaccine given:  My questions have been answered, the writing is legible, and I understand these instructions.  I will adhere to these goals & educational materials that have been provided to me after my discharge from the hospital.     My questions have been answered and I understand these instructions. I will adhere to these goals and the provided educational materials after my discharge from the hospital.  Patient/Caregiver Signature _______________________________ Date __________  Clinician Signature _______________________________________ Date __________  Please bring this form and your medication list with you to all your follow-up doctor's appointments.

## 2019-06-07 DIAGNOSIS — I69354 Hemiplegia and hemiparesis following cerebral infarction affecting left non-dominant side: Secondary | ICD-10-CM | POA: Diagnosis not present

## 2019-06-10 ENCOUNTER — Other Ambulatory Visit: Payer: Self-pay

## 2019-06-10 ENCOUNTER — Other Ambulatory Visit: Payer: Self-pay | Admitting: Pulmonary Disease

## 2019-06-10 ENCOUNTER — Telehealth: Payer: Self-pay | Admitting: Interventional Cardiology

## 2019-06-10 DIAGNOSIS — I6521 Occlusion and stenosis of right carotid artery: Secondary | ICD-10-CM

## 2019-06-10 MED ORDER — ATORVASTATIN CALCIUM 20 MG PO TABS: 40.0000 mg | ORAL_TABLET | Freq: Every day | ORAL | 11 refills | Status: DC

## 2019-06-10 NOTE — Telephone Encounter (Signed)
   Pt c/o medication issue:  1. Name of Medication: aspirin, eliquis and plavix. Simvastatin  2. How are you currently taking this medication (dosage and times per day)?   3. Are you having a reaction (difficulty breathing--STAT)?   4. What is your medication issue? Beth [name] from Dr. Ammie Ferrier office calling, they wanted to know.  - If pt should be holding aspirin since pt is on aspirin, eliquis and plavix  - if pt should resume her simvastatin

## 2019-06-10 NOTE — Telephone Encounter (Signed)
Called and spoke to Manuela Schwartz at Dr. Ammie Ferrier office and made her aware that per Dr. Irish Lack advises for the patient to stop ASA while on both plavix and eliquis. Made her aware that from a cardiac perspective she should still be taking simvastatin. It appears that this was d/c' by neurology while the patient was in the hospital. Advised that she touch bas with them to see if there was a reason that they d/c'd it. She verbalized understanding and thanked me for the call.

## 2019-06-11 ENCOUNTER — Telehealth: Payer: Self-pay | Admitting: Surgery

## 2019-06-11 MED ORDER — ATORVASTATIN CALCIUM 20 MG PO TABS: 20.0000 mg | ORAL_TABLET | Freq: Every day | ORAL | 12 refills | Status: DC

## 2019-06-11 NOTE — Telephone Encounter (Signed)
Patient never received rx for lipitor.  I called it into her pharmacy today  WB

## 2019-06-13 DIAGNOSIS — I10 Essential (primary) hypertension: Secondary | ICD-10-CM | POA: Diagnosis not present

## 2019-06-13 DIAGNOSIS — E7849 Other hyperlipidemia: Secondary | ICD-10-CM | POA: Diagnosis not present

## 2019-06-13 DIAGNOSIS — E119 Type 2 diabetes mellitus without complications: Secondary | ICD-10-CM | POA: Diagnosis not present

## 2019-06-13 DIAGNOSIS — J449 Chronic obstructive pulmonary disease, unspecified: Secondary | ICD-10-CM | POA: Diagnosis not present

## 2019-06-13 DIAGNOSIS — F3341 Major depressive disorder, recurrent, in partial remission: Secondary | ICD-10-CM | POA: Diagnosis not present

## 2019-06-14 DIAGNOSIS — Z6841 Body Mass Index (BMI) 40.0 and over, adult: Secondary | ICD-10-CM | POA: Diagnosis not present

## 2019-06-14 DIAGNOSIS — M19072 Primary osteoarthritis, left ankle and foot: Secondary | ICD-10-CM

## 2019-06-14 DIAGNOSIS — S93402A Sprain of unspecified ligament of left ankle, initial encounter: Secondary | ICD-10-CM | POA: Insufficient documentation

## 2019-06-14 DIAGNOSIS — I6930 Unspecified sequelae of cerebral infarction: Secondary | ICD-10-CM | POA: Diagnosis not present

## 2019-06-16 ENCOUNTER — Encounter: Payer: Medicare Other | Admitting: Registered Nurse

## 2019-06-16 ENCOUNTER — Other Ambulatory Visit: Payer: Self-pay | Admitting: Primary Care

## 2019-06-17 ENCOUNTER — Encounter (HOSPITAL_COMMUNITY): Payer: Self-pay | Admitting: Vascular Surgery

## 2019-06-17 ENCOUNTER — Telehealth: Payer: Self-pay | Admitting: Primary Care

## 2019-06-17 ENCOUNTER — Other Ambulatory Visit: Payer: Self-pay

## 2019-06-17 ENCOUNTER — Telehealth: Payer: Self-pay

## 2019-06-17 ENCOUNTER — Other Ambulatory Visit (HOSPITAL_COMMUNITY)
Admission: RE | Admit: 2019-06-17 | Discharge: 2019-06-17 | Disposition: A | Discharge status: Home / Self Care | Payer: Medicare Other | Source: Ambulatory Visit | Attending: Vascular Surgery | Admitting: Vascular Surgery

## 2019-06-17 DIAGNOSIS — Z20822 Contact with and (suspected) exposure to covid-19: Secondary | ICD-10-CM | POA: Insufficient documentation

## 2019-06-17 DIAGNOSIS — Z01812 Encounter for preprocedural laboratory examination: Secondary | ICD-10-CM | POA: Insufficient documentation

## 2019-06-17 MED ORDER — VANCOMYCIN HCL 1500 MG/300ML IV SOLN
1500.0000 mg | INTRAVENOUS | Status: AC
  Administered 2019-06-20: 1500 mg via INTRAVENOUS
  Filled 2019-06-17: qty 300

## 2019-06-17 MED ORDER — ATORVASTATIN CALCIUM 20 MG PO TABS: 20.0000 mg | ORAL_TABLET | Freq: Every day | ORAL | 3 refills | Status: DC

## 2019-06-17 NOTE — Progress Notes (Signed)
Beth [name] denies chest pain or shortness.  Patient was tested for Covid today and is in quarantine with her family.  Allizon Zelenak PA- C has reviewed chart .

## 2019-06-17 NOTE — Anesthesia Preprocedure Evaluation (Addendum)
Anesthesia Evaluation  Patient identified by MRN, date of birth, ID band Patient awake    Reviewed: Allergy & Precautions, NPO status , Patient's Chart, lab work & pertinent test results  Airway Mallampati: II  TM Distance: >3 FB Neck ROM: Full    Dental  (+) Teeth Intact, Dental Advisory Given   Pulmonary former smoker,    breath sounds clear to auscultation       Cardiovascular hypertension,  Rhythm:Irregular Rate:Normal     Neuro/Psych    GI/Hepatic   Endo/Other    Renal/GU      Musculoskeletal   Abdominal (+) + obese,   Peds  Hematology   Anesthesia Other Findings   Reproductive/Obstetrics                             Anesthesia Physical Anesthesia Plan  ASA: III  Anesthesia Plan: General   Post-op Pain Management:    Induction: Intravenous  PONV Risk Score and Plan: Ondansetron and Dexamethasone  Airway Management Planned: Oral ETT  Additional Equipment: Arterial line  Intra-op Plan:   Post-operative Plan: Extubation in OR  Informed Consent: I have reviewed the patients History and Physical, chart, labs and discussed the procedure including the risks, benefits and alternatives for the proposed anesthesia with the patient or authorized representative who has indicated his/her understanding and acceptance.     Dental advisory given  Plan Discussed with: CRNA and Anesthesiologist  Anesthesia Plan Comments: (See PAT note written 06/17/2019 by Myra Gianotti, PA-C. SAME DAY WORK-UP  )       Anesthesia Quick Evaluation

## 2019-06-17 NOTE — Progress Notes (Signed)
Anesthesia Chart Review:  Case: [id number] Date/Time: 06/20/19 0715   Procedure: TRANSCAROTID ARTERY REVASCULARIZATION (Right )   Anesthesia type: General   Pre-op diagnosis: CAROTID ARTERY STENOSIS   Location: [facility] OR ROOM 16 / [facility]   Surgeons: Angelia Mould, MD      DISCUSSION: Patient is a 79 year old female scheduled for the above procedure.  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   Admitted 05/16/19-05/24/19 for left sided weakness and diagnosed with right MCA infarct. She was not felt to be candidate for tPA given history of ruptured cerebral aneurysm without know status of subsequent intervention. 2D echo did not show obvious source of cardiac etiology. Carotid US showed 60-79% RICA stenosis. She developed afib with RVR on 05/17/19 that was managed by the hospitalist (cardiology not consulted; she became rate controlled with diltiazem; atenolol discontinued and started on Eliquis). Neurology and vascular consulted. RICA stenosis felt most likely cause of CVA, rather than afib, although could not rule out completely as embolic source. Vascular surgery did not feel she was a candidate for right carotid endarterectomy due to very high bifurcation, but felt to be a candidate for TCAR. Discharged to CIR 05/24/19-06/05/09 with planned TCAR 06/20/19.  Per [facility] note, for TCAR, Plavix recommended for at least 3 days prior and would need to continue for at least a month. 05/21/19 note by Dr. Scot Dock also mention that she will also need to be on ASA 81 mg for at least 3 days prior to procedure.  He wanted her to hold Eliquis for 48 hour prior to procedure and continue statin. (There is a phone message on 06/10/19, indication that Dr.  Irish Lack had advised to hold ASA when she is on both Plavix and Eliquis.)   Discussed above with anesthesiologist Renold Don, MD. Phone and staff messages left for Dr. Irish Lack notifying him that TCAR is planned for symptomatic RICA stenosis and of new onset afib last month (although he seems to already be aware that she was started on Eliquis), so cardiology follow-up in the near future can be arranged.    Patient is a same day work-up, so further evaluation by her anesthesia team on the day of surgery. 06/17/19 preprocedure COVID-19 test is in process.   VS:  BP Readings from Last 3 Encounters:  06/06/19 (!) 119/59  05/26/19 (!) 145/72  05/02/19 120/80   Pulse Readings from Last 3 Encounters:  06/06/19 67  05/26/19 80  05/02/19 60     PROVIDERS: Kathyrn Lass, MD is PCP  - Kara Mead, MD is pulmonologist. Last evaluation 05/02/19 by Geraldo Pitter, NP. COPD "mostely well controlled on LABA/LAMA". Stiolto resumed. Continue 2 at rest and 2-3L on exertion to keep O2 >88-90%. Larae Grooms, MD is cardiologist. Last evaluation 11/01/18 for follow-up palpitations and for dyspnea. If persistent palpitations then 30 day event monitor planned. Echo ordered and showed normal LVEF without significant valvular abnormality. - Dohmeier, Asencion Partridge, MD is neurologist She has been evaluated by Kalman Shan, MD and Clovis Cao, MD for BRCA+.   LABS: As of 06/06/19, lab results include: Lab Results  Component Value Date   WBC 7.6 06/06/2019   HGB 12.1 06/06/2019   HCT 36.6 06/06/2019   PLT 291 06/06/2019   GLUCOSE 171 (  H) 06/06/2019   CHOL 151 05/17/2019   TRIG 172 (H) 05/17/2019   HDL 39 (L) 05/17/2019   LDLCALC 78 05/17/2019   ALT 20 05/25/2019   AST 22 05/25/2019   NA 136 06/06/2019   K 4.3 06/06/2019   CL 98 06/06/2019   CREATININE 0.70 06/06/2019   BUN 7 (L) 06/06/2019   CO2 28 06/06/2019   TSH 1.737 05/16/2019   INR 0.9 05/16/2019   HGBA1C 6.0 (H)  05/17/2019     PFTs 10/05/15: FVC 1.73 (60%), post 1.88 (65%). FEV1 0.89 (41%), post 1.02 (47%). TLC 5.38 (106%). DLOC unc 10.33 (42%), cor 9.75 (40%).   IMAGES: Carotid 05/25/19: FINDINGS: The heart size and mediastinal contours are within normal limits. Both lungs are clear. Again noted is mild hyperinflation of the upper lung zones. Calcified granulomata seen within the left upper lung and right lung base. The visualized skeletal structures are unremarkable. IMPRESSION: No active cardiopulmonary disease.  Findings suggestive of COPD.  MRI Brain 05/16/19: IMPRESSION: 1. Small acute right MCA infarct. 2. Chronic right temporal lobe infarct. 3. Mild chronic small vessel ischemic disease.  CTA head/neck 05/16/19: IMPRESSION: No large vessel occlusion. Plaque at the right ICA origin causes approximately 55% stenosis. There is no measurable stenosis at the left ICA origin.   EKG: 05/17/19:  Ventricular rate 151 bpm Atrial fibrillation with rapid ventricular response with premature ventricular or aberrantly conducted complexes Left axis deviation Anteroseptal infarct , age undetermined Abnormal ECG Confirmed by Thompson Grayer ([id number]) on 05/18/2019 9:58:03 PM - Afib new when compared to 11/01/18 EKG   CV: Carotid US 05/18/19: Summary:  Right Carotid: Velocities in the right ICA are consistent with a 60-79%         stenosis.  Left Carotid: Velocities in the left ICA are consistent with a 1-39%  stenosis.  Vertebrals: Right vertebral artery demonstrates antegrade flow. Left  vertebral        artery was not visualized.  Subclavians: Normal flow hemodynamics were seen in bilateral subclavian        arteries.    Echo 05/17/19: IMPRESSIONS  1. Left ventricular ejection fraction, by estimation, is 55 to 60%. The  left ventricle has normal function. Left ventricular endocardial border  not optimally defined to evaluate regional wall motion. Left ventricular   diastolic function could not be  evaluated.  2. Right ventricular systolic function was not well visualized. The right  ventricular size is not well visualized.  3. The mitral valve is grossly normal. Mild mitral valve regurgitation.  4. The aortic valve was not well visualized. Aortic valve regurgitation  is not visualized. No aortic stenosis is present.  5. Pulmonic valve regurgitation not well visualized.  Conclusion(s)/Recommendation(s): No intracardiac source of embolism  detected on this transthoracic study. A transesophageal echocardiogram is  recommended to exclude cardiac source of embolism if clinically indicated.  Technically challenging study.  Recommend use of echo contrast for future studies. Prominent pericardial  fat pad but only very small effusion. LV function globally appears normal,  but cannot exclude small focal wall motion abnormalities.  (Comparison Echo 11/04/18: LVEF 65-79%, normal LVEF, impaired relaxation pattern of LV diastolic filling, normal RVSF, mild MR)   Nuclear stress tet 02/28/16:  Nuclear stress EF: 77%.  The left ventricular ejection fraction is hyperdynamic (>65%).  There was no ST segment deviation noted during stress.  The study is normal.  This is a low risk study. Low risk stress nuclear study with normal perfusion and normal left  ventricular regional and global systolic function.    Past Medical History:  Diagnosis Date  . BRCA2 positive 2017  . Diverticulosis   . Hearing loss   . HTN (hypertension)   . Memory loss   . 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 Seizures ([place])   . Stroke, hemorrhagic ([place]) aneurysm bleed.   . Vestibulitis of ear     Past Surgical History:  Procedure Laterality Date  . broken ankle Right   . CEREBRAL ANEURYSM REPAIR    . TONSILLECTOMY      MEDICATIONS: . [START ON 06/20/2019] vancomycin (VANCOREADY) IVPB 1500 mg/300 mL   .  acetaminophen (TYLENOL) 325 MG tablet  . apixaban (ELIQUIS) 5 MG TABS tablet  . captopril (CAPOTEN) 50 MG tablet  . Cholecalciferol (VITAMIN D3) 2000 units capsule  . clonazePAM (KLONOPIN) 2 MG tablet  . clopidogrel (PLAVIX) 75 MG tablet  . diltiazem (CARDIZEM CD) 300 MG 24 hr capsule  . famotidine (PEPCID) 20 MG tablet  . fluticasone (FLONASE) 50 MCG/ACT nasal spray  . gabapentin (NEURONTIN) 600 MG tablet  . hydrocerin (EUCERIN) CREA  . levETIRAcetam 750 MG TB3D  . LEXAPRO 20 MG tablet  . loratadine (CLARITIN) 10 MG tablet  . Menthol-Methyl Salicylate (MUSCLE RUB) 10-15 % CREA  . polyethylene glycol (MIRALAX / GLYCOLAX) 17 g packet  . sodium chloride (OCEAN) 0.65 % SOLN nasal spray  . vitamin B-12 (CYANOCOBALAMIN) 500 MCG tablet  . atorvastatin (LIPITOR) 20 MG tablet  . PROAIR HFA 108 (90 Base) MCG/ACT inhaler  . STIOLTO RESPIMAT 2.5-2.5 MCG/ACT AERS    Myra Gianotti, PA-C Surgical Short Stay/Anesthesiology [facility] Phone [phone] [facility] Phone [phone] 06/17/2019 4:43 PM

## 2019-06-17 NOTE — Telephone Encounter (Addendum)
Spoke with pt, she wanted to see if she can increase her oxygen because she has been running as low as 89%. She is on 2L, trying 2.5 but lately it has not been enough. I advised her that she needs to to make sure her oxygen is above 88% at all times. She understood and has a pulse oximeter to check.  She can go up to 3L according to Beth's note from her last visit. Pt made aware. Nothing further needed.     Patient Instructions by Martyn Ehrich, NP at 05/02/2019 2:30 PM Author: Martyn Ehrich, NP Author Type: Nurse Practitioner Filed: 05/02/2019 3:40 PM  Note Status: Addendum Cosign: Cosign Not Required Encounter Date: 05/02/2019  Editor: Martyn Ehrich, NP (Nurse Practitioner)  Prior Versions: 1. Martyn Ehrich, NP (Nurse Practitioner) at 05/02/2019 3:40 PM - Signed    Continue Anoro until you get prescription for Stiolto Rx Stiolto 2 puffs once daily   You should use 2-3L oxygen on exertion   Follow-up in 6 months with Dr. Elsworth Soho

## 2019-06-17 NOTE — Telephone Encounter (Signed)
[name] (daughter of pt) called regarding Lipitor rx called in for 20mg  vs the 40 mg pt had been on previously. Per Dr. Donzetta Matters pt can remain on 20mg . [name] verbalized understanding and no further questions/concerns.

## 2019-06-18 LAB — SARS CORONAVIRUS 2 (TAT 6-24 HRS): SARS Coronavirus 2: NEGATIVE

## 2019-06-20 ENCOUNTER — Inpatient Hospital Stay (HOSPITAL_COMMUNITY): Payer: Medicare Other | Admitting: Vascular Surgery

## 2019-06-20 ENCOUNTER — Encounter (HOSPITAL_COMMUNITY): Payer: Self-pay | Admitting: Vascular Surgery

## 2019-06-20 ENCOUNTER — Encounter (HOSPITAL_COMMUNITY)
Admission: RE | Disposition: A | Discharge status: Home / Self Care | Payer: Self-pay | Source: Ambulatory Visit | Attending: Vascular Surgery

## 2019-06-20 ENCOUNTER — Other Ambulatory Visit: Payer: Self-pay | Admitting: *Deleted

## 2019-06-20 ENCOUNTER — Inpatient Hospital Stay (HOSPITAL_COMMUNITY)
Admission: RE | Admit: 2019-06-20 | Discharge: 2019-06-22 | DRG: 034 | Disposition: A | Discharge status: Home / Self Care | Payer: Medicare Other | Source: Ambulatory Visit | Attending: Vascular Surgery | Admitting: Vascular Surgery

## 2019-06-20 ENCOUNTER — Other Ambulatory Visit: Payer: Self-pay

## 2019-06-20 ENCOUNTER — Inpatient Hospital Stay (HOSPITAL_COMMUNITY): Payer: Medicare Other

## 2019-06-20 DIAGNOSIS — Z79899 Other long term (current) drug therapy: Secondary | ICD-10-CM

## 2019-06-20 DIAGNOSIS — Z87891 Personal history of nicotine dependence: Secondary | ICD-10-CM | POA: Diagnosis not present

## 2019-06-20 DIAGNOSIS — I69354 Hemiplegia and hemiparesis following cerebral infarction affecting left non-dominant side: Secondary | ICD-10-CM | POA: Diagnosis not present

## 2019-06-20 DIAGNOSIS — H9190 Unspecified hearing loss, unspecified ear: Secondary | ICD-10-CM | POA: Diagnosis present

## 2019-06-20 DIAGNOSIS — Z7902 Long term (current) use of antithrombotics/antiplatelets: Secondary | ICD-10-CM | POA: Diagnosis not present

## 2019-06-20 DIAGNOSIS — I10 Essential (primary) hypertension: Secondary | ICD-10-CM | POA: Diagnosis present

## 2019-06-20 DIAGNOSIS — J449 Chronic obstructive pulmonary disease, unspecified: Secondary | ICD-10-CM | POA: Diagnosis present

## 2019-06-20 DIAGNOSIS — I9788 Other intraoperative complications of the circulatory system, not elsewhere classified: Secondary | ICD-10-CM | POA: Diagnosis not present

## 2019-06-20 DIAGNOSIS — Z6841 Body Mass Index (BMI) 40.0 and over, adult: Secondary | ICD-10-CM | POA: Diagnosis not present

## 2019-06-20 DIAGNOSIS — Z7901 Long term (current) use of anticoagulants: Secondary | ICD-10-CM

## 2019-06-20 DIAGNOSIS — G40909 Epilepsy, unspecified, not intractable, without status epilepticus: Secondary | ICD-10-CM | POA: Diagnosis present

## 2019-06-20 DIAGNOSIS — I4891 Unspecified atrial fibrillation: Secondary | ICD-10-CM | POA: Diagnosis present

## 2019-06-20 DIAGNOSIS — J441 Chronic obstructive pulmonary disease with (acute) exacerbation: Secondary | ICD-10-CM | POA: Diagnosis not present

## 2019-06-20 DIAGNOSIS — E785 Hyperlipidemia, unspecified: Secondary | ICD-10-CM | POA: Diagnosis not present

## 2019-06-20 DIAGNOSIS — Z9981 Dependence on supplemental oxygen: Secondary | ICD-10-CM | POA: Diagnosis not present

## 2019-06-20 DIAGNOSIS — I7771 Dissection of carotid artery: Secondary | ICD-10-CM | POA: Diagnosis not present

## 2019-06-20 DIAGNOSIS — Z20822 Contact with and (suspected) exposure to covid-19: Secondary | ICD-10-CM | POA: Diagnosis present

## 2019-06-20 DIAGNOSIS — J9611 Chronic respiratory failure with hypoxia: Secondary | ICD-10-CM | POA: Diagnosis present

## 2019-06-20 DIAGNOSIS — R0989 Other specified symptoms and signs involving the circulatory and respiratory systems: Secondary | ICD-10-CM | POA: Diagnosis not present

## 2019-06-20 DIAGNOSIS — I6529 Occlusion and stenosis of unspecified carotid artery: Secondary | ICD-10-CM | POA: Diagnosis present

## 2019-06-20 DIAGNOSIS — I639 Cerebral infarction, unspecified: Secondary | ICD-10-CM | POA: Diagnosis present

## 2019-06-20 DIAGNOSIS — I6521 Occlusion and stenosis of right carotid artery: Principal | ICD-10-CM

## 2019-06-20 HISTORY — DX: Chronic obstructive pulmonary disease, unspecified: J44.9

## 2019-06-20 HISTORY — DX: Prediabetes: R73.03

## 2019-06-20 HISTORY — DX: Cardiac arrhythmia, unspecified: I49.9

## 2019-06-20 HISTORY — DX: Unspecified atrial fibrillation: I48.91

## 2019-06-20 HISTORY — DX: Unspecified osteoarthritis, unspecified site: M19.90

## 2019-06-20 HISTORY — PX: TRANSCAROTID ARTERY REVASCULARIZATIONÂ: SHX6778

## 2019-06-20 HISTORY — DX: Cerebral infarction, unspecified: I63.9

## 2019-06-20 HISTORY — DX: Other recurrent depressive disorders: F33.8

## 2019-06-20 LAB — CBC
HCT: 37.7 % (ref 36.0–46.0)
Hemoglobin: 12.2 g/dL (ref 12.0–15.0)
MCH: 33 pg (ref 26.0–34.0)
MCHC: 32.4 g/dL (ref 30.0–36.0)
MCV: 101.9 fL — ABNORMAL HIGH (ref 80.0–100.0)
Platelets: 235 10*3/uL (ref 150–400)
RBC: 3.7 MIL/uL — ABNORMAL LOW (ref 3.87–5.11)
RDW: 13.2 % (ref 11.5–15.5)
WBC: 8.8 10*3/uL (ref 4.0–10.5)
nRBC: 0 % (ref 0.0–0.2)

## 2019-06-20 LAB — COMPREHENSIVE METABOLIC PANEL
ALT: 16 U/L (ref 0–44)
AST: 18 U/L (ref 15–41)
Albumin: 3.3 g/dL — ABNORMAL LOW (ref 3.5–5.0)
Alkaline Phosphatase: 54 U/L (ref 38–126)
Anion gap: 9 (ref 5–15)
BUN: 11 mg/dL (ref 8–23)
CO2: 28 mmol/L (ref 22–32)
Calcium: 8.9 mg/dL (ref 8.9–10.3)
Chloride: 101 mmol/L (ref 98–111)
Creatinine, Ser: 0.72 mg/dL (ref 0.44–1.00)
GFR calc Af Amer: >60 mL/min (ref >60)
GFR calc non Af Amer: >60 mL/min (ref >60)
Glucose, Bld: 139 mg/dL — ABNORMAL HIGH (ref 70–99)
Potassium: 3.8 mmol/L (ref 3.5–5.1)
Sodium: 138 mmol/L (ref 135–145)
Total Bilirubin: 0.3 mg/dL (ref 0.3–1.2)
Total Protein: 6.7 g/dL (ref 6.5–8.1)

## 2019-06-20 LAB — ABO/RH: ABO/RH(D): A POS

## 2019-06-20 LAB — POCT ACTIVATED CLOTTING TIME: Activated Clotting Time: 318 seconds

## 2019-06-20 LAB — TYPE AND SCREEN
ABO/RH(D): A POS
Antibody Screen: NEGATIVE

## 2019-06-20 LAB — SURGICAL PCR SCREEN
MRSA, PCR: NEGATIVE
Staphylococcus aureus: NEGATIVE

## 2019-06-20 LAB — APTT: aPTT: 31 seconds (ref 24–36)

## 2019-06-20 LAB — PROTIME-INR
INR: 1 (ref 0.8–1.2)
Prothrombin Time: 12.4 seconds (ref 11.4–15.2)

## 2019-06-20 LAB — GLUCOSE, CAPILLARY: Glucose-Capillary: 136 mg/dL — ABNORMAL HIGH (ref 70–99)

## 2019-06-20 SURGERY — TRANSCAROTID ARTERY REVASCULARIZATION (TCAR)
Anesthesia: General | Site: Neck | Laterality: Right

## 2019-06-20 MED ORDER — CHLORHEXIDINE GLUCONATE 0.12 % MT SOLN
15.0000 mL | Freq: Once | OROMUCOSAL | Status: AC
  Administered 2019-06-20: 15 mL via OROMUCOSAL
  Filled 2019-06-20: qty 15

## 2019-06-20 MED ORDER — CEFAZOLIN SODIUM-DEXTROSE 2-4 GM/100ML-% IV SOLN
2.0000 g | Freq: Three times a day (TID) | INTRAVENOUS | Status: AC
  Administered 2019-06-20 (×2): 2 g via INTRAVENOUS
  Filled 2019-06-20 (×2): qty 100

## 2019-06-20 MED ORDER — GABAPENTIN 600 MG PO TABS
300.0000 mg | ORAL_TABLET | Freq: Every day | ORAL | Status: DC
  Administered 2019-06-20 – 2019-06-21 (×2): 300 mg via ORAL
  Filled 2019-06-20 (×2): qty 1

## 2019-06-20 MED ORDER — SALINE SPRAY 0.65 % NA SOLN
1.0000 | NASAL | Status: DC | PRN
  Filled 2019-06-20: qty 44

## 2019-06-20 MED ORDER — GLYCOPYRROLATE 0.2 MG/ML IJ SOLN
INTRAMUSCULAR | Status: DC | PRN
Start: 2019-06-20 — End: 2019-06-20
  Administered 2019-06-20: .1 mg via INTRAVENOUS

## 2019-06-20 MED ORDER — IODIXANOL 320 MG/ML IV SOLN
INTRAVENOUS | Status: DC | PRN
  Administered 2019-06-20: 50 mL via INTRA_ARTERIAL

## 2019-06-20 MED ORDER — HYDRALAZINE HCL 20 MG/ML IJ SOLN: 5.0000 mg | INTRAMUSCULAR | Status: DC | PRN

## 2019-06-20 MED ORDER — IPRATROPIUM-ALBUTEROL 0.5-2.5 (3) MG/3ML IN SOLN
RESPIRATORY_TRACT | Status: AC
  Filled 2019-06-20: qty 3

## 2019-06-20 MED ORDER — POTASSIUM CHLORIDE CRYS ER 20 MEQ PO TBCR: 20.0000 meq | EXTENDED_RELEASE_TABLET | Freq: Every day | ORAL | Status: DC | PRN

## 2019-06-20 MED ORDER — SUGAMMADEX SODIUM 200 MG/2ML IV SOLN
INTRAVENOUS | Status: DC | PRN
  Administered 2019-06-20: 200 mg via INTRAVENOUS

## 2019-06-20 MED ORDER — LACTATED RINGERS IV SOLN: INTRAVENOUS | Status: DC | PRN

## 2019-06-20 MED ORDER — FAMOTIDINE 20 MG PO TABS
20.0000 mg | ORAL_TABLET | Freq: Two times a day (BID) | ORAL | Status: DC
  Administered 2019-06-20 – 2019-06-22 (×5): 20 mg via ORAL
  Filled 2019-06-20 (×5): qty 1

## 2019-06-20 MED ORDER — DEXAMETHASONE SODIUM PHOSPHATE 10 MG/ML IJ SOLN
INTRAMUSCULAR | Status: AC
  Filled 2019-06-20: qty 1

## 2019-06-20 MED ORDER — SODIUM CHLORIDE 0.9 % IV SOLN
INTRAVENOUS | Status: AC
  Filled 2019-06-20: qty 1.2

## 2019-06-20 MED ORDER — FENTANYL CITRATE (PF) 250 MCG/5ML IJ SOLN
INTRAMUSCULAR | Status: AC
  Filled 2019-06-20: qty 5

## 2019-06-20 MED ORDER — LABETALOL HCL 5 MG/ML IV SOLN: 10.0000 mg | INTRAVENOUS | Status: DC | PRN

## 2019-06-20 MED ORDER — VITAMIN D 25 MCG (1000 UNIT) PO TABS
3000.0000 [IU] | ORAL_TABLET | Freq: Every day | ORAL | Status: DC
  Administered 2019-06-21 – 2019-06-22 (×2): 3000 [IU] via ORAL
  Filled 2019-06-20 (×2): qty 3

## 2019-06-20 MED ORDER — LIDOCAINE 2% (20 MG/ML) 5 ML SYRINGE
INTRAMUSCULAR | Status: DC | PRN
  Administered 2019-06-20: 40 mg via INTRAVENOUS

## 2019-06-20 MED ORDER — DEXAMETHASONE SODIUM PHOSPHATE 10 MG/ML IJ SOLN
INTRAMUSCULAR | Status: DC | PRN
  Administered 2019-06-20: 5 mg via INTRAVENOUS

## 2019-06-20 MED ORDER — ACETAMINOPHEN 650 MG RE SUPP: 325.0000 mg | RECTAL | Status: DC | PRN

## 2019-06-20 MED ORDER — PHENYLEPHRINE HCL-NACL 10-0.9 MG/250ML-% IV SOLN
INTRAVENOUS | Status: DC | PRN
  Administered 2019-06-20: 25 ug/min via INTRAVENOUS

## 2019-06-20 MED ORDER — DILTIAZEM HCL ER COATED BEADS 180 MG PO CP24
300.0000 mg | ORAL_CAPSULE | Freq: Every day | ORAL | Status: DC
  Administered 2019-06-22: 300 mg via ORAL
  Filled 2019-06-20 (×2): qty 1

## 2019-06-20 MED ORDER — PROTAMINE SULFATE 10 MG/ML IV SOLN
INTRAVENOUS | Status: DC | PRN
Start: 2019-06-20 — End: 2019-06-20
  Administered 2019-06-20: 50 mg via INTRAVENOUS

## 2019-06-20 MED ORDER — CLOPIDOGREL BISULFATE 75 MG PO TABS: 75.0000 mg | ORAL_TABLET | Freq: Every day | ORAL | Status: DC

## 2019-06-20 MED ORDER — LIDOCAINE 2% (20 MG/ML) 5 ML SYRINGE
INTRAMUSCULAR | Status: AC
  Filled 2019-06-20: qty 5

## 2019-06-20 MED ORDER — APIXABAN 5 MG PO TABS: 5.0000 mg | ORAL_TABLET | Freq: Two times a day (BID) | ORAL | Status: DC

## 2019-06-20 MED ORDER — CLOPIDOGREL BISULFATE 75 MG PO TABS
75.0000 mg | ORAL_TABLET | Freq: Every day | ORAL | Status: DC
  Administered 2019-06-20 – 2019-06-22 (×3): 75 mg via ORAL
  Filled 2019-06-20 (×3): qty 1

## 2019-06-20 MED ORDER — ASPIRIN EC 81 MG PO TBEC: 81.0000 mg | DELAYED_RELEASE_TABLET | Freq: Every day | ORAL | Status: DC

## 2019-06-20 MED ORDER — CAPTOPRIL 25 MG PO TABS
50.0000 mg | ORAL_TABLET | Freq: Two times a day (BID) | ORAL | Status: DC
  Administered 2019-06-20 – 2019-06-22 (×3): 50 mg via ORAL
  Filled 2019-06-20: qty 2
  Filled 2019-06-20: qty 1
  Filled 2019-06-20: qty 2
  Filled 2019-06-20: qty 1
  Filled 2019-06-20 (×4): qty 2

## 2019-06-20 MED ORDER — METOPROLOL TARTRATE 5 MG/5ML IV SOLN: 2.0000 mg | INTRAVENOUS | Status: DC | PRN

## 2019-06-20 MED ORDER — ACETAMINOPHEN 325 MG PO TABS
325.0000 mg | ORAL_TABLET | ORAL | Status: DC | PRN
  Administered 2019-06-20: 650 mg via ORAL
  Filled 2019-06-20: qty 2

## 2019-06-20 MED ORDER — PHENYLEPHRINE 40 MCG/ML (10ML) SYRINGE FOR IV PUSH (FOR BLOOD PRESSURE SUPPORT)
PREFILLED_SYRINGE | INTRAVENOUS | Status: AC
  Filled 2019-06-20: qty 10

## 2019-06-20 MED ORDER — ONDANSETRON HCL 4 MG/2ML IJ SOLN
INTRAMUSCULAR | Status: DC | PRN
  Administered 2019-06-20: 4 mg via INTRAVENOUS

## 2019-06-20 MED ORDER — ROCURONIUM BROMIDE 10 MG/ML (PF) SYRINGE
PREFILLED_SYRINGE | INTRAVENOUS | Status: DC | PRN
  Administered 2019-06-20: 60 mg via INTRAVENOUS
  Administered 2019-06-20: 40 mg via INTRAVENOUS

## 2019-06-20 MED ORDER — SODIUM CHLORIDE 0.9 % IV SOLN
INTRAVENOUS | Status: DC | PRN
  Administered 2019-06-20: 500 mL

## 2019-06-20 MED ORDER — PHENOL 1.4 % MT LIQD: 1.0000 | OROMUCOSAL | Status: DC | PRN

## 2019-06-20 MED ORDER — LEVETIRACETAM 750 MG PO TB3D: 750.0000 mg | ORAL_TABLET | Freq: Two times a day (BID) | ORAL | Status: DC

## 2019-06-20 MED ORDER — ROCURONIUM BROMIDE 10 MG/ML (PF) SYRINGE
PREFILLED_SYRINGE | INTRAVENOUS | Status: AC
  Filled 2019-06-20: qty 10

## 2019-06-20 MED ORDER — HEPARIN SODIUM (PORCINE) 5000 UNIT/ML IJ SOLN
5000.0000 [IU] | Freq: Three times a day (TID) | INTRAMUSCULAR | Status: DC
  Administered 2019-06-20 – 2019-06-21 (×2): 5000 [IU] via SUBCUTANEOUS
  Filled 2019-06-20 (×2): qty 1

## 2019-06-20 MED ORDER — LORATADINE 10 MG PO TABS
10.0000 mg | ORAL_TABLET | Freq: Every day | ORAL | Status: DC
  Administered 2019-06-21 – 2019-06-22 (×2): 10 mg via ORAL
  Filled 2019-06-20 (×3): qty 1

## 2019-06-20 MED ORDER — LIDOCAINE HCL (PF) 1 % IJ SOLN
INTRAMUSCULAR | Status: AC
  Filled 2019-06-20: qty 5

## 2019-06-20 MED ORDER — LEVETIRACETAM 750 MG PO TABS
750.0000 mg | ORAL_TABLET | Freq: Two times a day (BID) | ORAL | Status: DC
  Administered 2019-06-20 – 2019-06-22 (×5): 750 mg via ORAL
  Filled 2019-06-20 (×6): qty 1

## 2019-06-20 MED ORDER — ONDANSETRON HCL 4 MG/2ML IJ SOLN: 4.0000 mg | Freq: Four times a day (QID) | INTRAMUSCULAR | Status: DC | PRN

## 2019-06-20 MED ORDER — PROTAMINE SULFATE 10 MG/ML IV SOLN
INTRAVENOUS | Status: AC
  Filled 2019-06-20: qty 5

## 2019-06-20 MED ORDER — GUAIFENESIN-DM 100-10 MG/5ML PO SYRP: 15.0000 mL | ORAL_SOLUTION | ORAL | Status: DC | PRN

## 2019-06-20 MED ORDER — LACTATED RINGERS IV SOLN
INTRAVENOUS | Status: DC | PRN
Start: 2019-06-20 — End: 2019-06-20

## 2019-06-20 MED ORDER — SODIUM CHLORIDE 0.9 % IV SOLN
500.0000 mL | Freq: Once | INTRAVENOUS | Status: AC | PRN
  Administered 2019-06-21: 500 mL via INTRAVENOUS

## 2019-06-20 MED ORDER — PHENYLEPHRINE 40 MCG/ML (10ML) SYRINGE FOR IV PUSH (FOR BLOOD PRESSURE SUPPORT)
PREFILLED_SYRINGE | INTRAVENOUS | Status: DC | PRN
  Administered 2019-06-20 (×2): 80 ug via INTRAVENOUS

## 2019-06-20 MED ORDER — ASPIRIN EC 81 MG PO TBEC
81.0000 mg | DELAYED_RELEASE_TABLET | Freq: Every day | ORAL | Status: DC
  Administered 2019-06-21 – 2019-06-22 (×2): 81 mg via ORAL
  Filled 2019-06-20 (×2): qty 1

## 2019-06-20 MED ORDER — ASPIRIN EC 325 MG PO TBEC
325.0000 mg | DELAYED_RELEASE_TABLET | Freq: Every day | ORAL | Status: DC
Start: 2019-06-20 — End: 2019-06-20

## 2019-06-20 MED ORDER — ACETAMINOPHEN 325 MG PO TABS: 325.0000 mg | ORAL_TABLET | ORAL | Status: DC | PRN

## 2019-06-20 MED ORDER — MIDAZOLAM HCL 2 MG/2ML IJ SOLN
INTRAMUSCULAR | Status: AC
  Filled 2019-06-20: qty 2

## 2019-06-20 MED ORDER — MIDAZOLAM HCL 2 MG/2ML IJ SOLN
INTRAMUSCULAR | Status: DC | PRN
  Administered 2019-06-20: 1 mg via INTRAVENOUS

## 2019-06-20 MED ORDER — 0.9 % SODIUM CHLORIDE (POUR BTL) OPTIME
TOPICAL | Status: DC | PRN
  Administered 2019-06-20: 1000 mL

## 2019-06-20 MED ORDER — ESCITALOPRAM OXALATE 20 MG PO TABS
20.0000 mg | ORAL_TABLET | Freq: Every day | ORAL | Status: DC
  Administered 2019-06-20 – 2019-06-22 (×3): 20 mg via ORAL
  Filled 2019-06-20 (×3): qty 1

## 2019-06-20 MED ORDER — IPRATROPIUM-ALBUTEROL 0.5-2.5 (3) MG/3ML IN SOLN
3.0000 mL | Freq: Once | RESPIRATORY_TRACT | Status: AC
  Administered 2019-06-20: 3 mL via RESPIRATORY_TRACT

## 2019-06-20 MED ORDER — ONDANSETRON HCL 4 MG/2ML IJ SOLN
INTRAMUSCULAR | Status: AC
  Filled 2019-06-20: qty 2

## 2019-06-20 MED ORDER — OXYCODONE HCL 5 MG PO TABS
ORAL_TABLET | ORAL | Status: AC
  Filled 2019-06-20: qty 1

## 2019-06-20 MED ORDER — ALBUTEROL SULFATE (2.5 MG/3ML) 0.083% IN NEBU
2.5000 mg | INHALATION_SOLUTION | RESPIRATORY_TRACT | Status: DC | PRN
  Filled 2019-06-20: qty 3

## 2019-06-20 MED ORDER — FENTANYL CITRATE (PF) 250 MCG/5ML IJ SOLN
INTRAMUSCULAR | Status: DC | PRN
  Administered 2019-06-20: 100 ug via INTRAVENOUS
  Administered 2019-06-20: 50 ug via INTRAVENOUS

## 2019-06-20 MED ORDER — FLUTICASONE PROPIONATE 50 MCG/ACT NA SUSP
1.0000 | Freq: Every day | NASAL | Status: DC | PRN
  Filled 2019-06-20: qty 16

## 2019-06-20 MED ORDER — HEMOSTATIC AGENTS (NO CHARGE) OPTIME
TOPICAL | Status: DC | PRN
  Administered 2019-06-20: 1 via TOPICAL

## 2019-06-20 MED ORDER — HEPARIN SODIUM (PORCINE) 1000 UNIT/ML IJ SOLN
INTRAMUSCULAR | Status: DC | PRN
  Administered 2019-06-20: 11000 [IU] via INTRAVENOUS

## 2019-06-20 MED ORDER — DOCUSATE SODIUM 100 MG PO CAPS
100.0000 mg | ORAL_CAPSULE | Freq: Every day | ORAL | Status: DC
  Administered 2019-06-21 – 2019-06-22 (×2): 100 mg via ORAL
  Filled 2019-06-20 (×2): qty 1

## 2019-06-20 MED ORDER — VITAMIN B-12 1000 MCG PO TABS
500.0000 ug | ORAL_TABLET | Freq: Every day | ORAL | Status: DC
  Administered 2019-06-20 – 2019-06-22 (×3): 500 ug via ORAL
  Filled 2019-06-20 (×3): qty 1

## 2019-06-20 MED ORDER — KCL IN DEXTROSE-NACL 20-5-0.45 MEQ/L-%-% IV SOLN: INTRAVENOUS | Status: DC

## 2019-06-20 MED ORDER — HEPARIN SODIUM (PORCINE) 1000 UNIT/ML IJ SOLN
INTRAMUSCULAR | Status: AC
  Filled 2019-06-20: qty 1

## 2019-06-20 MED ORDER — VITAMIN D3 50 MCG (2000 UT) PO CAPS: 2000.0000 [IU] | ORAL_CAPSULE | Freq: Every day | ORAL | Status: DC

## 2019-06-20 MED ORDER — SODIUM CHLORIDE 0.9 % IV SOLN: INTRAVENOUS | Status: DC

## 2019-06-20 MED ORDER — MAGNESIUM SULFATE 2 GM/50ML IV SOLN: 2.0000 g | Freq: Every day | INTRAVENOUS | Status: DC | PRN

## 2019-06-20 MED ORDER — ALBUTEROL SULFATE HFA 108 (90 BASE) MCG/ACT IN AERS: 1.0000 | INHALATION_SPRAY | RESPIRATORY_TRACT | Status: DC

## 2019-06-20 MED ORDER — PROPOFOL 10 MG/ML IV BOLUS
INTRAVENOUS | Status: DC | PRN
  Administered 2019-06-20: 160 mg via INTRAVENOUS

## 2019-06-20 MED ORDER — OXYCODONE HCL 5 MG PO TABS
5.0000 mg | ORAL_TABLET | Freq: Once | ORAL | Status: AC
  Administered 2019-06-20: 5 mg via ORAL

## 2019-06-20 MED ORDER — ORAL CARE MOUTH RINSE: 15.0000 mL | Freq: Once | OROMUCOSAL | Status: AC

## 2019-06-20 MED ORDER — ARTIFICIAL TEARS OPHTHALMIC OINT
TOPICAL_OINTMENT | OPHTHALMIC | Status: AC
  Filled 2019-06-20: qty 3.5

## 2019-06-20 MED ORDER — CHLORHEXIDINE GLUCONATE CLOTH 2 % EX PADS: 6.0000 | MEDICATED_PAD | Freq: Once | CUTANEOUS | Status: DC

## 2019-06-20 MED ORDER — POLYETHYLENE GLYCOL 3350 17 G PO PACK
17.0000 g | PACK | Freq: Every day | ORAL | Status: DC
  Administered 2019-06-21 – 2019-06-22 (×2): 17 g via ORAL
  Filled 2019-06-20 (×3): qty 1

## 2019-06-20 MED ORDER — CLONAZEPAM 1 MG PO TABS
2.0000 mg | ORAL_TABLET | Freq: Every day | ORAL | Status: DC
  Administered 2019-06-20 – 2019-06-21 (×2): 2 mg via ORAL
  Filled 2019-06-20 (×2): qty 2

## 2019-06-20 MED ORDER — ASPIRIN 300 MG RE SUPP
300.0000 mg | RECTAL | Status: AC
  Administered 2019-06-20: 300 mg via RECTAL
  Filled 2019-06-20 (×3): qty 1

## 2019-06-20 MED ORDER — ALUM & MAG HYDROXIDE-SIMETH 200-200-20 MG/5ML PO SUSP: 15.0000 mL | ORAL | Status: DC | PRN

## 2019-06-20 MED ORDER — FENTANYL CITRATE (PF) 100 MCG/2ML IJ SOLN: 25.0000 ug | INTRAMUSCULAR | Status: DC | PRN

## 2019-06-20 MED ORDER — PROPOFOL 10 MG/ML IV BOLUS
INTRAVENOUS | Status: AC
  Filled 2019-06-20: qty 20

## 2019-06-20 MED ORDER — PANTOPRAZOLE SODIUM 40 MG PO TBEC
40.0000 mg | DELAYED_RELEASE_TABLET | Freq: Every day | ORAL | Status: DC
  Administered 2019-06-20 – 2019-06-22 (×3): 40 mg via ORAL
  Filled 2019-06-20 (×3): qty 1

## 2019-06-20 MED ORDER — ATORVASTATIN CALCIUM 10 MG PO TABS
20.0000 mg | ORAL_TABLET | Freq: Every day | ORAL | Status: DC
  Administered 2019-06-20 – 2019-06-22 (×3): 20 mg via ORAL
  Filled 2019-06-20 (×3): qty 2

## 2019-06-20 MED ORDER — ONDANSETRON HCL 4 MG/2ML IJ SOLN: 4.0000 mg | Freq: Once | INTRAMUSCULAR | Status: DC | PRN

## 2019-06-20 SURGICAL SUPPLY — 69 items
ADH SKN CLS APL DERMABOND .7 (GAUZE/BANDAGES/DRESSINGS) ×2
ADPR TBG 2 MALE LL ART (MISCELLANEOUS)
BAG BANDED W/RUBBER/TAPE 36X54 (MISCELLANEOUS) ×3 IMPLANT
BAG EQP BAND 135X91 W/RBR TAPE (MISCELLANEOUS) ×1
BALLN STERLING RX 5X30X80 (BALLOONS) ×3
BALLN STERLING RX 6X20X80 (BALLOONS) ×3
BALLOON STERLING RX 5X30X80 (BALLOONS) IMPLANT
BALLOON STERLING RX 6X20X80 (BALLOONS) IMPLANT
CANISTER SUCT 3000ML PPV (MISCELLANEOUS) ×3 IMPLANT
CANNULA VESSEL 3MM 2 BLNT TIP (CANNULA) ×2 IMPLANT
CATH BEACON 5 .035 40 KMP TP (CATHETERS) IMPLANT
CATH BEACON 5 .038 40 KMP TP (CATHETERS) ×3
CATH ROBINSON RED A/P 18FR (CATHETERS) ×2 IMPLANT
CATH SUCT 10FR WHISTLE TIP (CATHETERS) ×1 IMPLANT
CLIP VESOCCLUDE MED 6/CT (CLIP) ×3 IMPLANT
CLIP VESOCCLUDE SM WIDE 6/CT (CLIP) ×3 IMPLANT
COVER DOME SNAP 22 D (MISCELLANEOUS) ×3 IMPLANT
COVER PROBE W GEL 5X96 (DRAPES) ×3 IMPLANT
COVER WAND RF STERILE (DRAPES) ×1 IMPLANT
DERMABOND ADVANCED (GAUZE/BANDAGES/DRESSINGS) ×4
DERMABOND ADVANCED .7 DNX12 (GAUZE/BANDAGES/DRESSINGS) IMPLANT
DRAPE FEMORAL ANGIO 80X135IN (DRAPES) ×3 IMPLANT
DRAPE INCISE IOBAN 66X45 STRL (DRAPES) ×6 IMPLANT
ELECT REM PT RETURN 9FT ADLT (ELECTROSURGICAL) ×3
ELECTRODE REM PT RTRN 9FT ADLT (ELECTROSURGICAL) ×1 IMPLANT
GLOVE BIO SURGEON STRL SZ7.5 (GLOVE) ×7 IMPLANT
GLOVE BIOGEL PI IND STRL 8 (GLOVE) ×1 IMPLANT
GLOVE BIOGEL PI INDICATOR 8 (GLOVE) ×2
GLOVE ECLIPSE 7.5 STRL STRAW (GLOVE) ×4 IMPLANT
GOWN STRL REIN 3XL XLG LVL4 (GOWN DISPOSABLE) ×6 IMPLANT
GOWN STRL REUS W/ TWL LRG LVL3 (GOWN DISPOSABLE) ×2 IMPLANT
GOWN STRL REUS W/ TWL XL LVL3 (GOWN DISPOSABLE) ×1 IMPLANT
GOWN STRL REUS W/TWL LRG LVL3 (GOWN DISPOSABLE) ×6
GOWN STRL REUS W/TWL XL LVL3 (GOWN DISPOSABLE) ×3
GUIDEWIRE ENROUTE 0.014 (WIRE) ×11 IMPLANT
HEMOSTAT SNOW SURGICEL 2X4 (HEMOSTASIS) IMPLANT
INTRODUCER KIT GALT 7CM (INTRODUCER) ×3
IV ADAPTER SYR DOUBLE MALE LL (MISCELLANEOUS) IMPLANT
KIT BASIN OR (CUSTOM PROCEDURE TRAY) ×3 IMPLANT
KIT ENCORE 26 ADVANTAGE (KITS) ×3 IMPLANT
KIT HEART LEFT (KITS) ×2 IMPLANT
KIT INTRODUCER GALT 7 (INTRODUCER) ×1 IMPLANT
KIT TURNOVER KIT B (KITS) ×3 IMPLANT
NDL HYPO 25GX1X1/2 BEV (NEEDLE) IMPLANT
NDL PERC 18GX7CM (NEEDLE) ×1 IMPLANT
NEEDLE HYPO 25GX1X1/2 BEV (NEEDLE) IMPLANT
NEEDLE PERC 18GX7CM (NEEDLE) ×3 IMPLANT
PACK CAROTID (CUSTOM PROCEDURE TRAY) ×3 IMPLANT
PAD ARMBOARD 7.5X6 YLW CONV (MISCELLANEOUS) ×6 IMPLANT
POSITIONER HEAD DONUT 9IN (MISCELLANEOUS) ×3 IMPLANT
SET MICROPUNCTURE 5F STIFF (MISCELLANEOUS) ×2 IMPLANT
STENT TRANSCAROTID SYSTEM 8X40 (Permanent Stent) ×2 IMPLANT
STOPCOCK 4 WAY LG BORE MALE ST (IV SETS) ×3 IMPLANT
SUT MNCRL AB 4-0 PS2 18 (SUTURE) IMPLANT
SUT PROLENE 5 0 C 1 24 (SUTURE) ×4 IMPLANT
SUT PROLENE 6 0 BV (SUTURE) ×6 IMPLANT
SUT SILK 2 0 PERMA HAND 18 BK (SUTURE) ×3 IMPLANT
SUT SILK 2 0 SH (SUTURE) ×3 IMPLANT
SUT VIC AB 3-0 SH 27 (SUTURE) ×6
SUT VIC AB 3-0 SH 27X BRD (SUTURE) ×2 IMPLANT
SUT VIC AB 4-0 PS2 18 (SUTURE) ×2 IMPLANT
SYR 10ML LL (SYRINGE) ×9 IMPLANT
SYR 20ML LL LF (SYRINGE) ×3 IMPLANT
SYR CONTROL 10ML LL (SYRINGE) IMPLANT
SYSTEM TRANSCAROTID NEUROPRTCT (MISCELLANEOUS) ×1 IMPLANT
TOWEL GREEN STERILE (TOWEL DISPOSABLE) ×3 IMPLANT
TRANSCAROTID NEUROPROTECT SYS (MISCELLANEOUS) ×3
WATER STERILE IRR 1000ML POUR (IV SOLUTION) ×3 IMPLANT
WIRE BENTSON .035X145CM (WIRE) ×3 IMPLANT

## 2019-06-20 NOTE — Anesthesia Postprocedure Evaluation (Signed)
Anesthesia Post Note  Patient: Beth [name]  Procedure(s) Performed: RIGHT TRANSCAROTID ARTERY REVASCULARIZATION (Right Neck)     Patient location during evaluation: PACU Anesthesia Type: General Level of consciousness: awake and alert Pain management: pain level controlled Vital Signs Assessment: post-procedure vital signs reviewed and stable Respiratory status: spontaneous breathing, nonlabored ventilation, respiratory function stable and patient connected to nasal cannula oxygen Cardiovascular status: blood pressure returned to baseline and stable Postop Assessment: no apparent nausea or vomiting Anesthetic complications: no    Last Vitals:  Vitals:   06/20/19 1900 06/20/19 1958  BP:  107/68  Pulse: 80 80  Resp: 15 15  Temp:  36.6 C  SpO2: 94% 96%    Last Pain:  Vitals:   06/20/19 1958  TempSrc: Oral  PainSc:                  Khandi Kernes COKER

## 2019-06-20 NOTE — Transfer of Care (Signed)
Immediate Anesthesia Transfer of Care Note  Patient: Beth [name]  Procedure(s) Performed: RIGHT TRANSCAROTID ARTERY REVASCULARIZATION (Right Neck)  Patient Location: PACU  Anesthesia Type:General  Level of Consciousness: drowsy and patient cooperative  Airway & Oxygen Therapy: Patient Spontanous Breathing and Patient connected to face mask oxygen  Post-op Assessment: Report given to RN and Post -op Vital signs reviewed and stable  Post vital signs: Reviewed and stable  Last Vitals:  Vitals Value Taken Time  BP 143/124 06/20/19 1023  Temp    Pulse 91 06/20/19 1027  Resp 12 06/20/19 1027  SpO2 97 % 06/20/19 1027  Vitals shown include unvalidated device data.  Last Pain:  Vitals:   06/20/19 0719  TempSrc:   PainSc: 0-No pain      Patients Stated Pain Goal: 6 (80/99/83 3825)  Complications: No apparent anesthesia complications

## 2019-06-20 NOTE — Progress Notes (Addendum)
Patient was unable to void since she arrived on the unit.  She could feel pressure in her abdomen but was unable to start a stream.  Tried to place patient in an upright position to let gravity help to void but with no success.  A bladder scan was done and it showed there was more than 516 cc of urine in the bladder.  The nurse called the on call vascular surgeon and asked for an order to perform an in & out catheter, which was approved.  The nurse performed the in & out cath and got out 1200 mL of urine.  The patient stated she felt much better afterwards.  Will continue to monitor.  Lupita Dawn, RN

## 2019-06-20 NOTE — Anesthesia Procedure Notes (Signed)
Arterial Line Insertion Start/End6/07/2019 7:39 AM, 06/20/2019 7:39 AM Performed by: Kathryne Hitch, CRNA, CRNA  Patient location: Pre-op. Preanesthetic checklist: patient identified, IV checked, site marked, risks and benefits discussed, surgical consent, monitors and equipment checked, pre-op evaluation and timeout performed Lidocaine 1% used for infiltration Left, radial was placed Catheter size: 20 Fr Hand hygiene performed  and maximum sterile barriers used   Attempts: 2 Procedure performed without using ultrasound guided technique. Following insertion, dressing applied and Biopatch. Post procedure assessment: normal and unchanged  Post procedure complications: local hematoma. Patient tolerated the procedure well with no immediate complications.

## 2019-06-20 NOTE — Interval H&P Note (Signed)
History and Physical Interval Note:  06/20/2019 7:08 AM  Beth [name]  has presented today for surgery, with the diagnosis of CAROTID ARTERY STENOSIS.  The various methods of treatment have been discussed with the patient and family. After consideration of risks, benefits and other options for treatment, the patient has consented to  Procedure(s): TRANSCAROTID ARTERY REVASCULARIZATION (Right) as a surgical intervention.  The patient's history has been reviewed, patient examined, no change in status, stable for surgery.  I have reviewed the patient's chart and labs.  Questions were answered to the patient's satisfaction.     [name]

## 2019-06-20 NOTE — Progress Notes (Signed)
Pt arrived from unit from PACU.  BP 102/58 (72)  Spo2 98 on mask 6L  HR 81 .Will continue to monitor. Pt. Oriented to unit   Phoebe Sharps, RN

## 2019-06-20 NOTE — Anesthesia Procedure Notes (Signed)
Procedure Name: Intubation Date/Time: 06/20/2019 8:38 AM Performed by: Kathryne Hitch, CRNA Pre-anesthesia Checklist: Patient identified, Emergency Drugs available, Suction available and Patient being monitored Patient Re-evaluated:Patient Re-evaluated prior to induction Oxygen Delivery Method: Circle system utilized Preoxygenation: Pre-oxygenation with 100% oxygen Induction Type: IV induction Ventilation: Mask ventilation without difficulty Laryngoscope Size: Miller and 3 Grade View: Grade I Tube type: Oral Tube size: 7.0 mm Number of attempts: 1 Airway Equipment and Method: Stylet and Oral airway Placement Confirmation: ETT inserted through vocal cords under direct vision,  positive ETCO2 and breath sounds checked- equal and bilateral Secured at: 22 cm Tube secured with: Tape Dental Injury: Teeth and Oropharynx as per pre-operative assessment

## 2019-06-20 NOTE — Progress Notes (Signed)
   VASCULAR SURGERY POSTOP:   No specific complaints.  Still with minimal movement left arm and left lower extremity.  She had significant weakness preop and I have written to reconsult physical therapy tomorrow.  She may need to be considered for inpatient rehab again.  The family is concerned that she has not been getting much therapy at home.   SUBJECTIVE:   No specific complaints  PHYSICAL EXAM:   Vitals:   06/20/19 1115 06/20/19 1130 06/20/19 1138 06/20/19 1200  BP:   104/65 (!) 102/58  Pulse: 85 85 (!) 104 72  Resp: 10 10 18 18   Temp:    97.6 F (36.4 C)  TempSrc:    Oral  SpO2: 96% 97% 96%   Weight:      Height:       Minimal movement left upper and left lower extremity.  This has not changed significantly from her preop status.  LABS:   Lab Results  Component Value Date   WBC 8.8 06/20/2019   HGB 12.2 06/20/2019   HCT 37.7 06/20/2019   MCV 101.9 (H) 06/20/2019   PLT 235 06/20/2019   Lab Results  Component Value Date   CREATININE 0.72 06/20/2019   Lab Results  Component Value Date   INR 1.0 06/20/2019   CBG (last 3)  Recent Labs    06/20/19 0622  GLUCAP 136*    PROBLEM LIST:    Active Problems:   Carotid artery stenosis   Stroke (HCC)   CURRENT MEDS:   . apixaban  5 mg Oral BID  . [START ON 06/21/2019] aspirin EC  81 mg Oral Daily  . atorvastatin  20 mg Oral Daily  . captopril  50 mg Oral BID  . [START ON 06/21/2019] cholecalciferol  3,000 Units Oral Daily  . clonazePAM  2 mg Oral QHS  . clopidogrel  75 mg Oral Daily  . [START ON 06/21/2019] clopidogrel  75 mg Oral Q0600  . diltiazem  300 mg Oral Daily  . [START ON 06/21/2019] docusate sodium  100 mg Oral Daily  . escitalopram  20 mg Oral Daily  . famotidine  20 mg Oral BID  . gabapentin  300 mg Oral QHS  . heparin  5,000 Units Subcutaneous Q8H  . ipratropium-albuterol      . levETIRAcetam  750 mg Oral BID  . loratadine  10 mg Oral Daily  . oxyCODONE      . pantoprazole  40 mg Oral Daily    . polyethylene glycol  17 g Oral Daily  . vitamin B-12  500 mcg Oral Daily    [name] Office: [phone] 06/20/2019

## 2019-06-20 NOTE — Progress Notes (Signed)
Receiving RN at bedside, spoke with Dr. Scot Dock, left sided weakness is baseline from previous stroke, VS stable, after looking through some notes patient is on 3L home O2-receiving RN aware, left venous groin stick. Family notified of patients room assignment.  Rowe Pavy, RN

## 2019-06-20 NOTE — Op Note (Signed)
Patient name: Beth [name] MRN: [id number] DOB: 1940-10-20 Sex: female  06/20/2019 Pre-operative Diagnosis: Symptomatic right carotid stenosis Post-operative diagnosis:  Same Surgeon:  Erlene Quan C. Donzetta Matters, MD Assistant: Gae Gallop, MD Procedure Performed:  Right transcarotid artery stenting with 8 x 40 en route stent with flow reversal neuro protection  Indications: 79 year old female with history of symptomatic right carotid lesion.  This was noted to be too high for surgical accessibility was indicated for transcarotid artery stenting.  Findings: We appeared to have dissection flap created right at the level of the sheath entry.  This initially prevented passage of wire we were able to ultimately get a wire under the petrous portion of the ICA on the right.  There was heavy calcification at the carotid bifurcation.  After stenting we initially had a waist which was ballooned with a 6 mm balloon.  At completion there was good flow through the carotid stent in 2 views there was no residual dissection.   Procedure:  The patient was identified in the holding area and taken to the operating room where she was put supine on the operative when general anesthesia was induced.  She was sterilely prepped and draped in the neck and chest in usual fashion antibiotics were minister timeout was called.  Ultrasound was used to identify the common carotid artery between the 2 heads of the sternocleidomastoid marked the incision on the neck.  We then cannulated the left common femoral vein with micropuncture needle followed the wire and sheath.  Bentson wires placed followed by the 8 [place] sheath this was sutured to the skin and flushed with heparinized saline.  Concomitantly longitudinal incision was made down through the 2 heads of the sternocleidomastoid.  We divided the omohyoid muscle.  We dissected down to the IJ identified the vagus nerve these were reflected laterally.  Common carotid was identified patient  was given 11,000 units of heparin.  ACT did returned greater than 300.  We placed a figure-of-eight type stitch in the common carotid artery with 5-0 Prolene suture.  We placed a vessel loop in a Potts configuration as well as umbilical tape around the common carotid artery.  We then cannulated the common carotid artery with micropuncture needle and placed a wire and sheath to 3 cm.  We performed angiography.  We then exchanged for the J. Amplatz wire and placed our 8 French carotid sheath.  This was sutured to the skin and connected to flow reversal flow reversal was confirmed although was somewhat sluggish.  We performed TCAR timeout systolic blood pressure was 160 heart rate was in the mid 70s ACT was greater than 300.  We then prepared a balloon and wire.  We attempted to cross the lesion.  This unfortunately.  To be in a dissection plane.  We ultimately had to pull back on the sheath direct the wire with a bare catheter were able to get this in the right plane.  We then had the wire unfortunately at the [facility] confirmed with angiography.  We then able to redirect the use of Berenstein catheter into the ICA.  The lesion was primary balloon dilated with 5 mm balloon.  The stent was brought to the table this was put in place and deployed.  We did at the retractor on the sheath in order to deploy this down to the common carotid artery.  After 2 minutes we then performed angiography there appeared to be a waste.  This was then dilated with 6 mm  balloon.  We confirmed angiography and 2 views now appearing to have good flow with no residual dissection.  Satisfied with this we removed our wire we removed flow reversal which was a total time of 30 minutes and 55 seconds.  We removed the sheath from the neck and suture-ligated 050 Prolene suture there was good hemostasis.  We return the blood via the groin.  50 mg of protamine was administered.  We obtain hemostasis and the wound irrigated.  Neck wound was closed in layers  with Vicryl suture.  The sheath from the groin was pulled pressure was held.  She was awake from anesthesia having tolerated procedure well at any complication was transferred to the PACU in stable condition.  EBL:  100cc    Keaston Pile C. Donzetta Matters, MD [facility] [place] [facility] Office: [phone] Pager: [phone]

## 2019-06-21 ENCOUNTER — Encounter: Payer: Self-pay | Admitting: *Deleted

## 2019-06-21 LAB — CBC
HCT: 32.5 % — ABNORMAL LOW (ref 36.0–46.0)
Hemoglobin: 10.5 g/dL — ABNORMAL LOW (ref 12.0–15.0)
MCH: 32.6 pg (ref 26.0–34.0)
MCHC: 32.3 g/dL (ref 30.0–36.0)
MCV: 100.9 fL — ABNORMAL HIGH (ref 80.0–100.0)
Platelets: 211 10*3/uL (ref 150–400)
RBC: 3.22 MIL/uL — ABNORMAL LOW (ref 3.87–5.11)
RDW: 13 % (ref 11.5–15.5)
WBC: 11 10*3/uL — ABNORMAL HIGH (ref 4.0–10.5)
nRBC: 0 % (ref 0.0–0.2)

## 2019-06-21 MED ORDER — APIXABAN 5 MG PO TABS
5.0000 mg | ORAL_TABLET | Freq: Two times a day (BID) | ORAL | Status: DC
  Administered 2019-06-21 – 2019-06-22 (×3): 5 mg via ORAL
  Filled 2019-06-21 (×3): qty 1

## 2019-06-21 NOTE — Evaluation (Addendum)
Physical Therapy Evaluation Patient Details Name: Beth [name] MRN: [id number] DOB: [birth year] Today's Date: 06/21/2019   History of Present Illness  Pt adm for rt transcarotid artery stenting on 06/20/19. 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 Pt's husband reports significant improvement in lt weakness since yesterday but not yet back to her baseline after leaving CIR. Hopeful that pt continues to progress and that tomorrow her mobility will be closer to her new baseline and that husband will be able to assist.     Follow Up Recommendations Home health PT;Supervision/Assistance - 24 hour(if she continues to improve as she did in last 24 hours)    Equipment Recommendations  None recommended by PT    Recommendations for Other Services       Precautions / Restrictions Precautions Precautions: Fall Precaution Comments: Left inattention,  O2 dependent at home      Mobility  Bed Mobility Overal bed mobility: Needs Assistance Bed Mobility: Supine to Sit     Supine to sit: Min assist;HOB elevated     General bed mobility comments: Assist to elevate trunk into sitting and to bring hips to EOB. Verbal/tactile cues for technique  Transfers Overall transfer level: Needs assistance Equipment used: Rolling walker (2 wheeled) Transfers: Sit to/from [facility] Sit to Stand: Min assist;Mod assist Stand pivot transfers: Mod assist;Min assist       General transfer comment: Assist to bring hips up and for balance. Mod assist from bed and min assist from chair or bsc. Bed to bsc with mod assist due to poor attention to lt resulting in decr balance. BSC to bed to recliner with min assist and frequent verbal/tactile cues for technique and safety.   Ambulation/Gait Ambulation/Gait assistance: Min assist Gait Distance (Feet): 8 Feet Assistive  device: Rolling walker (2 wheeled) Gait Pattern/deviations: Step-to pattern;Decreased step length - right;Decreased step length - left;Decreased dorsiflexion - left Gait velocity: decr Gait velocity interpretation: <1.31 ft/sec, indicative of household ambulator General Gait Details: Assist for balance and support. Frequent verbal/tactile cues  Stairs            Wheelchair Mobility    Modified Rankin (Stroke Patients Only)       Balance Overall balance assessment: Needs assistance Sitting-balance support: Single extremity supported;Feet supported Sitting balance-Leahy Scale: Poor Sitting balance - Comments: UE support and min assist to correct initial posterior lean                                     Pertinent Vitals/Pain      Home Living Family/patient expects to be discharged to:: Private residence Living Arrangements: Spouse/significant other Available Help at Discharge: Family;Available 24 hours/day Type of Home: House Home Access: Stairs to enter;Ramped entrance(ramp being built) Entrance Stairs-Rails: Right Technical brewer of Steps: 4 Home Layout: One level Home Equipment: Shower seat      Prior Function Level of Independence: Needs assistance   Gait / Transfers Assistance Needed: After CVA in April and CIR pt amb household distances with supervision with rolling walker           Hand Dominance   Dominant Hand: Right    Extremity/Trunk Assessment   Upper Extremity Assessment Upper Extremity Assessment: Defer to OT evaluation    Lower Extremity Assessment Lower Extremity Assessment: LLE  deficits/detail LLE Deficits / Details: to formal testing 4-/5 but functionally less due to inattention       Communication   Communication: No difficulties  Cognition Arousal/Alertness: Awake/alert Behavior During Therapy: Impulsive Overall Cognitive Status: Impaired/Different from baseline Area of Impairment:  Safety/judgement;Awareness;Attention                   Current Attention Level: Selective     Safety/Judgement: Decreased awareness of safety;Decreased awareness of deficits Awareness: Emergent   General Comments: lt inattention      General Comments      Exercises     Assessment/Plan    PT Assessment Patient needs continued PT services  PT Problem List Decreased strength;Decreased balance;Decreased mobility;Decreased cognition;Decreased safety awareness       PT Treatment Interventions DME instruction;Gait training;Functional mobility training;Therapeutic activities;Therapeutic exercise;Balance training;Neuromuscular re-education;Cognitive remediation;Patient/family education    PT Goals (Current goals can be found in the Care Plan section)  Acute Rehab PT Goals Patient Stated Goal: go home PT Goal Formulation: With patient/family Time For Goal Achievement: 07/05/19 Potential to Achieve Goals: Good    Frequency Min 3X/week   Barriers to discharge        Co-evaluation               AM-PAC PT "6 Clicks" Mobility  Outcome Measure Help needed turning from your back to your side while in a flat bed without using bedrails?: A Little Help needed moving from lying on your back to sitting on the side of a flat bed without using bedrails?: A Little Help needed moving to and from a bed to a chair (including a wheelchair)?: A Lot Help needed standing up from a chair using your arms (e.g., wheelchair or bedside chair)?: A Little Help needed to walk in hospital room?: A Little Help needed climbing 3-5 steps with a railing? : Total 6 Click Score: 15    End of Session Equipment Utilized During Treatment: Gait belt Activity Tolerance: Patient tolerated treatment well Patient left: in chair;with call bell/phone within reach;with bed alarm set;with family/visitor present Nurse Communication: Mobility status(nurse tech) PT Visit Diagnosis: Unsteadiness on feet  (R26.81);Other abnormalities of gait and mobility (R26.89);Difficulty in walking, not elsewhere classified (R26.2);Hemiplegia and hemiparesis Hemiplegia - Right/Left: Left Hemiplegia - dominant/non-dominant: Non-dominant Hemiplegia - caused by: Cerebral infarction    Time: 1610-9604 PT Time Calculation (min) (ACUTE ONLY): 44 min   Charges:   PT Evaluation $PT Eval Moderate Complexity: 1 Mod PT Treatments $Gait Training: 8-22 mins $Therapeutic Activity: 8-22 mins        [place] Pager [phone] Office [place] 06/21/2019, 3:46 PM

## 2019-06-21 NOTE — Progress Notes (Signed)
[facility] [place]  Subjective  - doing well over all.  Some slow improvement in left hand and left LE movement.   Objective (!) 102/56 81 97.6 F (36.4 C) (Oral) 15 96%  Intake/Output Summary (Last 24 hours) at 06/21/2019 0733 Last data filed at 06/21/2019 0443 Gross per 24 hour  Intake 2520.03 ml  Output 1250 ml  Net 1270.03 ml    Right UE motor grip 5/5, palpable radial pulse Right subclavian incision healing well Weakness post CVA left UE/LE Lungs non labored  Speech clear  Assessment/Planning: POD #1 Right transcarotid artery stenting with 8 x 40 en route stent with flow reversal neuro protection Eval with PT for recommendation s/p CVA function HH verse in patient CIR.  Start Eliquis today.  Cont. Aspirin, Statin, Plavix.  Roxy Horseman 06/21/2019 7:33 AM --  Laboratory Lab Results: Recent Labs    06/20/19 0657 06/21/19 0410  WBC 8.8 11.0*  HGB 12.2 10.5*  HCT 37.7 32.5*  PLT 235 211   BMET Recent Labs    06/20/19 0657  NA 138  K 3.8  CL 101  CO2 28  GLUCOSE 139*  BUN 11  CREATININE 0.72  CALCIUM 8.9    COAG Lab Results  Component Value Date   INR 1.0 06/20/2019   INR 0.9 05/16/2019   INR 0.9 06/10/2008   No results found for: PTT

## 2019-06-21 NOTE — Progress Notes (Addendum)
ANTICOAGULATION CONSULT NOTE - Initial Consult  Pharmacy Consult for Eliquis Indication: nonvalvular atrial fibrillation  Allergies  Allergen Reactions  . Prozac [Fluoxetine Hcl] Other (See Comments)    seizures  . Sulfa Antibiotics Nausea And Vomiting and Other (See Comments)    TINGLING IN LEGS ALSO  . Micardis [Telmisartan] Swelling and Other (See Comments)    Swelling, bruising Swelling, bruising  . Prednisone Other (See Comments)    mood changes Mood changes  . Lisinopril-Hydrochlorothiazide Other (See Comments)    unknown  . Augmentin [Amoxicillin-Pot Clavulanate] Rash  . Avelox [Moxifloxacin Hcl In Nacl] Rash    LEVAQUIN IS OK-SEE NOTE  . Norvasc [Amlodipine Besylate] Other (See Comments)    fatigue    Patient Measurements: Height: 5' 4" (162.6 cm) Weight: 107 kg (236 lb) IBW/kg (Calculated) : 54.7 Heparin Dosing Weight:   Vital Signs: Temp: 97.6 F (36.4 C) (06/08 0400) Temp Source: Oral (06/08 0400) BP: 102/56 (06/08 0643) Pulse Rate: 81 (06/08 0643)  Labs: Recent Labs    06/20/19 0657 06/21/19 0410  HGB 12.2 10.5*  HCT 37.7 32.5*  PLT 235 211  APTT 31  --   LABPROT 12.4  --   INR 1.0  --   CREATININE 0.72  --     Estimated Creatinine Clearance: 70.3 mL/min (by C-G formula based on SCr of 0.72 mg/dL).   Medical History: Past Medical History:  Diagnosis Date  . Afib ([place])    diagnosed 05/17/19  . Arthritis   . BRCA2 positive 2017  . COPD (chronic obstructive pulmonary disease) ([place])   . Diverticulosis   . Dysrhythmia    Afib  . Hearing loss   . HTN (hypertension)   . Memory loss   . Pre-diabetes   . Seasonal depression ([place])   . 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 Seizures [facility])    anniversary seizure - 1 year after serizure  . Stroke ([place])   . 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   . Vestibulitis of ear     Medications:  Medications Prior to Admission  Medication  Sig Dispense Refill Last Dose  . acetaminophen (TYLENOL) 325 MG tablet Take 1-2 tablets (325-650 mg total) by mouth every 4 (four) hours as needed for mild pain.   06/19/2019 at Unknown time  . apixaban (ELIQUIS) 5 MG TABS tablet Take 1 tablet (5 mg total) by mouth 2 (two) times daily. 60 tablet 0 06/17/2019 at 1000  . aspirin EC 81 MG tablet Take 81 mg by mouth daily.   06/19/2019 at Unknown time  . atorvastatin (LIPITOR) 20 MG tablet Take 1 tablet (20 mg total) by mouth daily. 90 tablet 3 06/19/2019 at Unknown time  . captopril (CAPOTEN) 50 MG tablet Take 50 mg by mouth 2 (two) times daily.   06/19/2019 at Unknown time  . Cholecalciferol (VITAMIN D3) 2000 units capsule Take 2,000 Units by mouth daily.    Past Week at Unknown time  . clonazePAM (KLONOPIN) 2 MG tablet TAKE 1 TABLET AT BEDTIME (Patient taking differently: Take 2 mg by mouth at bedtime. For seizure activity or insomnia dohmeier.) 90 tablet 0 06/19/2019 at Unknown time  . clopidogrel (PLAVIX) 75 MG tablet Take 1 tablet (75 mg total) by mouth daily. 30 tablet 0 06/19/2019 at Unknown time  . diltiazem (CARDIZEM CD) 300 MG 24 hr capsule Take 1 capsule (300 mg total) by mouth daily. 30 capsule 0 06/19/2019 at Unknown time  . famotidine (  PEPCID) 20 MG tablet Take 1 tablet (20 mg total) by mouth 2 (two) times daily. 60 tablet 0 06/19/2019 at Unknown time  . fluticasone (FLONASE) 50 MCG/ACT nasal spray Place 1 spray into the nose daily as needed for allergies. Spray twice daily as needed    06/19/2019 at Unknown time  . gabapentin (NEURONTIN) 600 MG tablet Take 0.5 tablets (300 mg total) by mouth at bedtime. 30 tablet 0 06/19/2019 at Unknown time  . hydrocerin (EUCERIN) CREA Apply 1 application topically 2 (two) times daily.  0 Past Month at Unknown time  . levETIRAcetam 750 MG TB3D Take 750 mg by mouth 2 (two) times daily. 180 tablet 0 06/19/2019 at Unknown time  . LEXAPRO 20 MG tablet Take 1 tablet (20 mg total) by mouth daily. Morning dosage 90 tablet 3 06/19/2019 at  Unknown time  . loratadine (CLARITIN) 10 MG tablet Take 1 tablet (10 mg total) by mouth daily.   06/19/2019 at Unknown time  . Menthol-Methyl Salicylate (MUSCLE RUB) 10-15 % CREA Apply 1 application topically as needed for muscle pain. To neck and shoulder  0 Past Month at Unknown time  . PROAIR HFA 108 (90 Base) MCG/ACT inhaler USE 2 INHALATIONS EVERY 4 HOURS AS NEEDED FOR WHEEZING OR SHORTNESS OF BREATH 8.5 g 20 06/19/2019 at Unknown time  . sodium chloride (OCEAN) 0.65 % SOLN nasal spray Place 1 spray into both nostrils in the morning, at noon, in the evening, and at bedtime.  0 06/19/2019 at Unknown time  . STIOLTO RESPIMAT 2.5-2.5 MCG/ACT AERS INHALE 2 PUFFS INTO THE LUNGS DAILY 4 g 3 06/19/2019 at Unknown time  . vitamin B-12 (CYANOCOBALAMIN) 500 MCG tablet Take 1 tablet (500 mcg total) by mouth daily.   Past Week at Unknown time  . polyethylene glycol (MIRALAX / GLYCOLAX) 17 g packet Take 17 g by mouth daily. Purchase bottle over the counter and use capful daily 14 each 0 Unknown at Unknown time    Assessment: 79 y.o female on Eliquis 5 mg BID prior to admission for h/o atrial fibrillation and s/p CVA.  She was admitted 06/20/19 with h/o symptomatic right carotid lesion. Elqius held preop for surgery.  Now  POD #1 s/p right transcarotid artery stenting.  VSS consulted pharmacy to resume Eliquis.  VVS also notes to continue ASA 47m, statin and plavix.   Hgb 12.2>10.5, Pltc 235>211.  No bleeding reported.    Goal of Therapy:  Monitor platelets by anticoagulation protocol: Yes   Plan:  Discontinue the sq heparin (last received dose today at 06:49) Resume Eliquis 5 mg po BID as taken prior to admission.  Monitor for signs and symptoms of bleeding.   RNicole Cella RPh Clinical Pharmacist [phone] check AMION for all [place] numbers After 10:00 PM, call [phone]/08/2019,7:59 AM

## 2019-06-22 ENCOUNTER — Inpatient Hospital Stay (HOSPITAL_COMMUNITY): Payer: Medicare Other

## 2019-06-22 LAB — CBC
HCT: 32.1 % — ABNORMAL LOW (ref 36.0–46.0)
Hemoglobin: 10.2 g/dL — ABNORMAL LOW (ref 12.0–15.0)
MCH: 32.6 pg (ref 26.0–34.0)
MCHC: 31.8 g/dL (ref 30.0–36.0)
MCV: 102.6 fL — ABNORMAL HIGH (ref 80.0–100.0)
Platelets: 224 10*3/uL (ref 150–400)
RBC: 3.13 MIL/uL — ABNORMAL LOW (ref 3.87–5.11)
RDW: 13.2 % (ref 11.5–15.5)
WBC: 8.7 10*3/uL (ref 4.0–10.5)
nRBC: 0 % (ref 0.0–0.2)

## 2019-06-22 MED ORDER — ALUM & MAG HYDROXIDE-SIMETH 200-200-20 MG/5ML PO SUSP: 15.0000 mL | ORAL | 0 refills | Status: DC | PRN

## 2019-06-22 NOTE — Progress Notes (Signed)
Discharge instructions provided to patient and husband. All medications, follow up appointments, and discharge instructions provided. IV out. Monitor off CCMD notified. Discharging to home with husband.  Taliyah Watrous R Kelilah Hebard, RN 

## 2019-06-22 NOTE — Progress Notes (Signed)
Ct sinus: 06/22/2019 IMPRESSION: No active sinusitis or other acute finding.   Electronically Signed   By: Monte Fantasia M.D.   On: 06/22/2019 11:38  DC home

## 2019-06-22 NOTE — Progress Notes (Signed)
Physical Therapy Treatment Patient Details Name: Beth [name] MRN: [id number] DOB: 11-08-1940 Today's Date: 06/22/2019    History of Present Illness Pt adm for rt transcarotid artery stenting on 06/20/19. 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 Requiring less assist today with mobility. Her lt inattention and her decr attention in general require frequent cueing. Reinforced to pt that she will need assist with mobility at home and likely will need to use BSC at home some until she regains more mobility. Husband is very supportive.    Follow Up Recommendations  Home health PT;Supervision/Assistance - 24 hour     Equipment Recommendations  None recommended by PT    Recommendations for Other Services       Precautions / Restrictions Precautions Precautions: Fall Precaution Comments: Left inattention,  O2 dependent at home Restrictions Weight Bearing Restrictions: No    Mobility  Bed Mobility Overal bed mobility: Needs Assistance Bed Mobility: Supine to Sit     Supine to sit: Supervision;HOB elevated     General bed mobility comments: Supervision for lines/safety  Transfers Overall transfer level: Needs assistance Equipment used: Rolling walker (2 wheeled) Transfers: Sit to/from [facility] Sit to Stand: Min assist Stand pivot transfers: Min assist       General transfer comment: Assist for balance and to maintain LUE on walker  Ambulation/Gait Ambulation/Gait assistance: Min assist Gait Distance (Feet): 15 Feet Assistive device: Rolling walker (2 wheeled) Gait Pattern/deviations: Step-to pattern;Decreased step length - right;Decreased step length - left;Decreased dorsiflexion - left Gait velocity: decr Gait velocity interpretation: <1.31 ft/sec, indicative of household ambulator General Gait Details: Assist for balance and support. Frequent verbal/tactile cues to keep lt hand  on walker and to keep lt foot inside of walker.    Stairs             Wheelchair Mobility    Modified Rankin (Stroke Patients Only)       Balance Overall balance assessment: Needs assistance Sitting-balance support: Feet supported;No upper extremity supported Sitting balance-Leahy Scale: Fair     Standing balance support: Single extremity supported;During functional activity Standing balance-Leahy Scale: Poor Standing balance comment: UE support and min guard for static standing. Assist for any dynamic activity.                            Cognition Arousal/Alertness: Awake/alert Behavior During Therapy: Impulsive Overall Cognitive Status: Impaired/Different from baseline Area of Impairment: Safety/judgement;Awareness;Attention                   Current Attention Level: Selective     Safety/Judgement: Decreased awareness of safety;Decreased awareness of deficits Awareness: Emergent   General Comments: lt inattention      Exercises      General Comments        Pertinent Vitals/Pain      Home Living                      Prior Function            PT Goals (current goals can now be found in the care plan section) Acute Rehab PT Goals Patient Stated Goal: go home Progress towards PT goals: Progressing toward goals    Frequency    Min 3X/week      PT Plan Current plan remains appropriate    Co-evaluation  AM-PAC PT "6 Clicks" Mobility   Outcome Measure  Help needed turning from your back to your side while in a flat bed without using bedrails?: A Little Help needed moving from lying on your back to sitting on the side of a flat bed without using bedrails?: A Little Help needed moving to and from a bed to a chair (including a wheelchair)?: A Little Help needed standing up from a chair using your arms (e.g., wheelchair or bedside chair)?: A Little Help needed to walk in hospital room?: A  Little Help needed climbing 3-5 steps with a railing? : Total 6 Click Score: 16    End of Session Equipment Utilized During Treatment: Gait belt Activity Tolerance: Patient tolerated treatment well Patient left: in chair;with call bell/phone within reach;with bed alarm set Nurse Communication: Mobility status(nurse) PT Visit Diagnosis: Unsteadiness on feet (R26.81);Other abnormalities of gait and mobility (R26.89);Difficulty in walking, not elsewhere classified (R26.2);Hemiplegia and hemiparesis Hemiplegia - Right/Left: Left Hemiplegia - dominant/non-dominant: Non-dominant Hemiplegia - caused by: Cerebral infarction     Time: 0911-0933 PT Time Calculation (min) (ACUTE ONLY): 22 min  Charges:  $Gait Training: 8-22 mins                     [place] Pager [phone] Office [place] 06/22/2019, 10:06 AM

## 2019-06-22 NOTE — Progress Notes (Signed)
   VASCULAR SURGERY ASSESSMENT & PLAN:   POD 2 TCAR: Patient is doing well.  She did reasonably well with physical therapy yesterday and they feel that it would be reasonable for her to go home with home health physical therapy.  If she does well this morning I think she can be discharged today.  SINUSITIS?  The patient complains of some frontal headache and pain on the right side of her face which she says she has had before associated with sinusitis.  She feels strongly about having this looked into before she goes home.  This reason I have ordered a maxillofacial CT without contrast.  She is on aspirin, Plavix, and a statin.  She is also back on her Eliquis.   SUBJECTIVE:   Complains of some mild frontal headache and right-sided facial headache which she says has been associated with sinusitis in the past.  PHYSICAL EXAM:   Vitals:   06/21/19 2143 06/22/19 0027 06/22/19 0304 06/22/19 0408  BP:  100/62 112/74 112/74  Pulse: 76 70 62 62  Resp: 13 14 13 13   Temp:  97.6 F (36.4 C) 97.8 F (36.6 C) 97.8 F (36.6 C)  TempSrc:  Oral  Oral  SpO2: 96% 91% 94% 94%  Weight:      Height:       Weakness on the left side is unchanged from preop. Her right neck incision looks fine.  LABS:   Lab Results  Component Value Date   WBC 8.7 06/22/2019   HGB 10.2 (L) 06/22/2019   HCT 32.1 (L) 06/22/2019   MCV 102.6 (H) 06/22/2019   PLT 224 06/22/2019   Lab Results  Component Value Date   CREATININE 0.72 06/20/2019   Lab Results  Component Value Date   INR 1.0 06/20/2019   CBG (last 3)  Recent Labs    06/20/19 0622  GLUCAP 136*    PROBLEM LIST:    Active Problems:   Carotid artery stenosis   Stroke (HCC)   CURRENT MEDS:   . apixaban  5 mg Oral BID  . aspirin EC  81 mg Oral Daily  . atorvastatin  20 mg Oral Daily  . captopril  50 mg Oral BID  . cholecalciferol  3,000 Units Oral Daily  . clonazePAM  2 mg Oral QHS  . clopidogrel  75 mg Oral Daily  . diltiazem  300  mg Oral Daily  . docusate sodium  100 mg Oral Daily  . escitalopram  20 mg Oral Daily  . famotidine  20 mg Oral BID  . gabapentin  300 mg Oral QHS  . levETIRAcetam  750 mg Oral BID  . loratadine  10 mg Oral Daily  . pantoprazole  40 mg Oral Daily  . polyethylene glycol  17 g Oral Daily  . vitamin B-12  500 mcg Oral Daily    [name] Office: [phone] 06/22/2019

## 2019-06-22 NOTE — Discharge Summary (Signed)
Discharge Summary     Beth [name] March 28, 1940 79 y.o. female  [id number]  Admission Date: 06/20/2019  Discharge Date: 06/22/2019 Physician: Angelia Mould, *  Admission Diagnosis: Carotid artery stenosis [I65.29] Stroke [facility]) [I63.9]   HPI:   This is a 79 y.o. female with symptomatic right carotid stenosis  Hospital Course:  The patient was admitted to the hospital and taken to the operating room on 06/20/2019 and underwent right transcarotid stenting by Dr. Donzetta Matters and Dr. Scot Dock.   Findings: We appeared to have dissection flap created right at the level of the sheath entry.  This initially prevented passage of wire we were able to ultimately get a wire under the petrous portion of the ICA on the right.  There was heavy calcification at the carotid bifurcation.  After stenting we initially had a waist which was ballooned with a 6 mm balloon.  At completion there was good flow through the carotid stent in 2 views there was no residual dissection.  The pt tolerated the procedure well and was transported to the PACU in good condition.   By POD 1, the pt neuro status at baseline. She has a history of prior stoke with residual left upper and left lower extremity weakness.  Her vital signs remained stable. Her incision was well approximated without hematoma. On POD 2, her Eliquis was restarted. PT/OT evaluations were carried out. Home health PT was recommended.  She complained of sinus fullness and cough and CT scan of the sinuses was obtained. No evidence of acute sinusitis.  The remainder of the hospital course consisted of increasing mobilization and increasing intake of solids without difficulty.   Recent Labs    06/20/19 0657  NA 138  K 3.8  CL 101  CO2 28  GLUCOSE 139*  BUN 11  CALCIUM 8.9   Recent Labs    06/21/19 0410 06/22/19 0401  WBC 11.0* 8.7  HGB 10.5* 10.2*  HCT 32.5* 32.1*  PLT 211 224   Recent Labs    06/20/19 0657  INR 1.0     Discharge  Instructions    Call MD for:  redness, tenderness, or signs of infection (pain, swelling, bleeding, redness, odor or green/yellow discharge around incision site)   Complete by: As directed    Call MD for:  severe or increased pain, loss or decreased feeling  in affected limb(s)   Complete by: As directed    Call MD for:  temperature >100.5   Complete by: As directed    Discharge patient   Complete by: As directed    Physical therapy   Discharge disposition: 06-Home-Health Care Svc   Discharge patient date: 06/22/2019   Resume previous diet   Complete by: As directed       Discharge Diagnosis:  Carotid artery stenosis [I65.29] Stroke [facility]) [I63.9]  Secondary Diagnosis: Patient Active Problem List   Diagnosis Date Noted  . Carotid artery stenosis 06/20/2019  . Stroke ([place]) 06/20/2019  . Degenerative arthritis of left foot 06/14/2019  . Left ankle sprain 06/14/2019  . On home O2 05/24/2019  . Acute right arterial ischemic stroke, MCA (middle cerebral artery) ([place]) 05/24/2019  . Stenosis of right carotid artery   . Atrial fibrillation with RVR ([place]) 05/18/2019  . Seizures ([place])   . HLD (hyperlipidemia)   . Macrocytosis   . Acute ischemic right MCA stroke ([place])   . Depression   . Allergic rhinitis 05/02/2019  . At high risk for breast cancer 12/09/2017  .  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 Obesity, morbid, BMI 40.0-49.9 ([place]) 04/30/2016  . Chronic respiratory failure ([place]) 12/11/2015  . COPD without exacerbation ([place]) 10/08/2015  . Genetic testing 08/26/2015  . 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  . Family history of ovarian cancer 07/26/2015  . 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 Benign paroxysmal positional vertigo 04/06/2014  . Tonic-clonic seizure syndrome ([place]) 04/06/2014  . Pulsatile tinnitus of right ear 04/06/2014  . Other forms of epilepsy and recurrent seizures without mention of intractable epilepsy 04/21/2013  . SAH  (subarachnoid hemorrhage) ([place]) 04/21/2013  . 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 Stroke, hemorrhagic ([place])   . HTN (hypertension)   . Vestibulitis of ear    Past Medical History:  Diagnosis Date  . Afib ([place])    diagnosed 05/17/19  . Arthritis   . BRCA2 positive 2017  . COPD (chronic obstructive pulmonary disease) ([place])   . Diverticulosis   . Dysrhythmia    Afib  . Hearing loss   . HTN (hypertension)   . Memory loss   . Pre-diabetes   . Seasonal depression ([place])   . 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 Seizures [facility])    anniversary seizure - 1 year after serizure  . Stroke ([place])   . Stroke, hemorrhagic ([place]) aneurysm bleed.   . Vestibulitis of ear     Allergies as of 06/22/2019      Reactions   Prozac [fluoxetine Hcl] Other (See Comments)   seizures   Sulfa Antibiotics Nausea And Vomiting, Other (See Comments)   TINGLING IN LEGS ALSO   Micardis [telmisartan] Swelling, Other (See Comments)   Swelling, bruising Swelling, bruising   Prednisone Other (See Comments)   mood changes Mood changes   Lisinopril-hydrochlorothiazide Other (See Comments)   unknown   Augmentin [amoxicillin-pot Clavulanate] Rash   Avelox [moxifloxacin Hcl In [place]] Rash   LEVAQUIN IS OK-SEE NOTE   Norvasc [amlodipine Besylate] Other (See Comments)   fatigue      Medication List    TAKE these medications   acetaminophen 325 MG tablet Commonly known as: TYLENOL Take 1-2 tablets (325-650 mg total) by mouth every 4 (four) hours as needed for mild pain.   alum & mag hydroxide-simeth 200-200-20 MG/5ML suspension Commonly known as: MAALOX/MYLANTA Take 15-30 mLs by mouth every 2 (two) hours as needed for indigestion.   apixaban 5 MG Tabs tablet Commonly known as: ELIQUIS Take 1 tablet (5 mg total) by mouth 2 (two) times daily.   aspirin EC 81 MG tablet Take 81 mg  by mouth daily.   atorvastatin 20 MG tablet Commonly known as: Lipitor Take 1 tablet (20 mg total) by mouth daily.   captopril 50 MG tablet Commonly known as: CAPOTEN Take 50 mg by mouth 2 (two) times daily.   clonazePAM 2 MG tablet Commonly known as: KLONOPIN TAKE 1 TABLET AT BEDTIME What changed: additional instructions   clopidogrel 75 MG tablet Commonly known as: PLAVIX Take 1 tablet (75 mg total) by mouth daily.   diltiazem 300 MG 24 hr capsule Commonly known as: CARDIZEM CD Take 1 capsule (300 mg total) by mouth daily.   famotidine 20 MG tablet Commonly known as: Pepcid Take 1 tablet (20 mg total) by mouth 2 (two) times daily.   Flonase 50 MCG/ACT nasal spray Generic drug: fluticasone Place 1 spray into the nose daily  as needed for allergies. Spray twice daily as needed   gabapentin 600 MG tablet Commonly known as: NEURONTIN Take 0.5 tablets (300 mg total) by mouth at bedtime.   hydrocerin Crea Apply 1 application topically 2 (two) times daily.   levETIRAcetam 750 MG Tb3d Take 750 mg by mouth 2 (two) times daily.   Lexapro 20 MG tablet Generic drug: escitalopram Take 1 tablet (20 mg total) by mouth daily. Morning dosage   loratadine 10 MG tablet Commonly known as: CLARITIN Take 1 tablet (10 mg total) by mouth daily.   Muscle Rub 10-15 % Crea Apply 1 application topically as needed for muscle pain. To neck and shoulder   polyethylene glycol 17 g packet Commonly known as: MIRALAX / GLYCOLAX Take 17 g by mouth daily. Purchase bottle over the counter and use capful daily   ProAir HFA 108 (90 Base) MCG/ACT inhaler Generic drug: albuterol USE 2 INHALATIONS EVERY 4 HOURS AS NEEDED FOR WHEEZING OR SHORTNESS OF BREATH   sodium chloride 0.65 % Soln nasal spray Commonly known as: OCEAN Place 1 spray into both nostrils in the morning, at noon, in the evening, and at bedtime.   Stiolto Respimat 2.5-2.5 MCG/ACT Aers Generic drug: Tiotropium  Bromide-Olodaterol INHALE 2 PUFFS INTO THE LUNGS DAILY   vitamin B-12 500 MCG tablet Commonly known as: CYANOCOBALAMIN Take 1 tablet (500 mcg total) by mouth daily.   Vitamin D3 50 MCG (2000 UT) capsule Take 2,000 Units by mouth daily.        [facility] Discharge Instructions Carotid Endarterectomy (CEA)  Please refer to the following instructions for your post-procedure care. Your surgeon or physician assistant will discuss any changes with you.  Activity  You are encouraged to walk as much as you can. You can slowly return to normal activities but must avoid strenuous activity and heavy lifting until your doctor tell you it's OK. Avoid activities such as vacuuming or swinging a golf club. You can drive after one week if you are comfortable and you are no longer taking prescription pain medications. It is normal to feel tired for serval weeks after your surgery. It is also normal to have difficulty with sleep habits, eating, and bowel movements after surgery. These will go away with time.  Bathing/Showering  You may shower after you come home. Do not soak in a bathtub, hot tub, or swim until the incision heals completely.  Incision Care  Shower every day. Clean your incision with mild soap and water. Pat the area dry with a clean towel. You do not need a bandage unless otherwise instructed. Do not apply any ointments or creams to your incision. You may have skin glue on your incision. Do not peel it off. It will come off on its own in about one week. Your incision may feel thickened and raised for several weeks after your surgery. This is normal and the skin will soften over time. For Men Only: It's OK to shave around the incision but do not shave the incision itself for 2 weeks. It is common to have numbness under your chin that could last for several months.  Diet  Resume your normal diet. There are no special food restrictions following this  procedure. A low fat/low cholesterol diet is recommended for all patients with vascular disease. In order to heal from your surgery, it is CRITICAL to get adequate nutrition. Your body requires vitamins, minerals, and protein. Vegetables are the best source of vitamins and minerals.  Vegetables also provide the perfect balance of protein. Processed food has little nutritional value, so try to avoid this.  Medications  Resume taking all of your medications unless your doctor or physician assistant tells you not to.  If your incision is causing pain, you may take over-the- counter pain relievers such as acetaminophen (Tylenol). If you were prescribed a stronger pain medication, please be aware these medications can cause nausea and constipation.  Prevent nausea by taking the medication with a snack or meal. Avoid constipation by drinking plenty of fluids and eating foods with a high amount of fiber, such as fruits, vegetables, and grains.  Do not take Tylenol if you are taking prescription pain medications.  Follow Up  Our office will schedule a follow up appointment 2-3 weeks following discharge.  Please call us immediately for any of the following conditions  . Increased pain, redness, drainage (pus) from your incision site. . Fever of 101 degrees or higher. . If you should develop stroke (slurred speech, difficulty swallowing, weakness on one side of your body, loss of vision) you should call 911 and go to the nearest emergency room. .  Reduce your risk of vascular disease:  . Stop smoking. If you would like help call [facility] at 1-800-QUIT-NOW 540-197-3768) or [place] at 2168155175. . Manage your cholesterol . Maintain a desired weight . Control your diabetes . Keep your blood pressure down .  If you have any questions, please call the office at [phone].  Prescriptions given: Maalox/Mylanta  Disposition: Home with Home health/PT  Patient's condition: is Good  Follow  up: 1. Dr. Scot Dock in $ weeks.   Risa Grill, PA-C [facility] [phone]   --- For [facility] use ---   Modified Rankin score at D/C (0-6): +4 (baseline, pre-op)   IV medication needed for:  1. Hypertension: No 2. Hypotension: No  Post-op Complications: No  1. Post-op CVA or TIA: No  If yes: Event classification (right eye, left eye, right cortical, left cortical, verterobasilar, other):   If yes: Timing of event (intra-op, <6 hrs post-op, >=6 hrs post-op, unknown):   2. CN injury: No  If yes: CN n/a injuried   3. Myocardial infarction: No  If yes: Dx by (EKG or clinical, Troponin):   4.  CHF: No  5.  Dysrhythmia (new): No  6. Wound infection: No  7. Reperfusion symptoms: No  8. Return to OR: No  If yes: return to OR for (bleeding, neurologic, other CEA incision, other):   Discharge medications: Statin use:  Yes ASA use:  No   Beta blocker use:  Yes ACE-Inhibitor use:  Yes  ARB use:  No CCB use: Yes P2Y12 Antagonist use: Yes, [x ] Plavix, '[ ]'  Plasugrel, '[ ]'  Ticlopinine, '[ ]'  Ticagrelor, '[ ]'  Other, '[ ]'  No for medical reason, '[ ]'  Non-compliant, '[ ]'  Not-indicated Anti-coagulant use:  Yes, '[ ]'  Warfarin, '[ ]'  Rivaroxaban, '[ ]'  Dabigatran,  apixaban

## 2019-06-22 NOTE — Discharge Instructions (Signed)
   [facility] [place]  Discharge Instructions   Carotid Endarterectomy (CEA)  Please refer to the following instructions for your post-procedure care. Your surgeon or physician assistant will discuss any changes with you.  Activity  You are encouraged to walk as much as you can. You can slowly return to normal activities but must avoid strenuous activity and heavy lifting until your doctor tell you it's OK. Avoid activities such as vacuuming or swinging a golf club. You can drive after one week if you are comfortable and you are no longer taking prescription pain medications. It is normal to feel tired for serval weeks after your surgery. It is also normal to have difficulty with sleep habits, eating, and bowel movements after surgery. These will go away with time.  Bathing/Showering  You may shower after you come home. Do not soak in a bathtub, hot tub, or swim until the incision heals completely.  Incision Care  Shower every day. Clean your incision with mild soap and water. Pat the area dry with a clean towel. You do not need a bandage unless otherwise instructed. Do not apply any ointments or creams to your incision. You may have skin glue on your incision. Do not peel it off. It will come off on its own in about one week. Your incision may feel thickened and raised for several weeks after your surgery. This is normal and the skin will soften over time. For Men Only: It's OK to shave around the incision but do not shave the incision itself for 2 weeks. It is common to have numbness under your chin that could last for several months.  Diet  Resume your normal diet. There are no special food restrictions following this procedure. A low fat/low cholesterol diet is recommended for all patients with vascular disease. In order to heal from your surgery, it is CRITICAL to get adequate nutrition. Your body requires vitamins, minerals, and protein. Vegetables are the best  source of vitamins and minerals. Vegetables also provide the perfect balance of protein. Processed food has little nutritional value, so try to avoid this.        Medications  Resume taking all of your medications unless your doctor or physician assistant tells you not to. If your incision is causing pain, you may take over-the- counter pain relievers such as acetaminophen (Tylenol). If you were prescribed a stronger pain medication, please be aware these medications can cause nausea and constipation. Prevent nausea by taking the medication with a snack or meal. Avoid constipation by drinking plenty of fluids and eating foods with a high amount of fiber, such as fruits, vegetables, and grains. Do not take Tylenol if you are taking prescription pain medications.  Follow Up  Our office will schedule a follow up appointment 2-3 weeks following discharge.  Please call us immediately for any of the following conditions  Increased pain, redness, drainage (pus) from your incision site. Fever of 101 degrees or higher. If you should develop stroke (slurred speech, difficulty swallowing, weakness on one side of your body, loss of vision) you should call 911 and go to the nearest emergency room.  Reduce your risk of vascular disease:  Stop smoking. If you would like help call [facility] at 1-800-QUIT-NOW (1-800-784-8669) or [place] at 336-586-4000. Manage your cholesterol Maintain a desired weight Control your diabetes Keep your blood pressure down  If you have any questions, please call the office at [phone].   

## 2019-06-23 ENCOUNTER — Telehealth: Payer: Self-pay

## 2019-06-23 NOTE — Telephone Encounter (Signed)
Telephone call received from Manuela Schwartz at [facility] regarding a clearance form sent for recommendations regarding Plavix, ASA and Eliquis. Contacted office back at [phone]. Unable to reach anyone or vm. Minette Brine, RN

## 2019-06-29 ENCOUNTER — Telehealth: Payer: Self-pay

## 2019-06-29 NOTE — Telephone Encounter (Signed)
Telephone call received from Manuela Schwartz at [facility] inquiring how long pt should stay on ASA, Plavix and Eliquis. Informed per Dr. Scot Dock that pt should only take Plavix for 30 days and remain on her ASA, Eliquis and statin medication post TCAR. Manuela Schwartz voiced understanding.

## 2019-06-30 DIAGNOSIS — J449 Chronic obstructive pulmonary disease, unspecified: Secondary | ICD-10-CM | POA: Diagnosis not present

## 2019-06-30 DIAGNOSIS — F3341 Major depressive disorder, recurrent, in partial remission: Secondary | ICD-10-CM | POA: Diagnosis not present

## 2019-06-30 DIAGNOSIS — E119 Type 2 diabetes mellitus without complications: Secondary | ICD-10-CM | POA: Diagnosis not present

## 2019-06-30 DIAGNOSIS — E7849 Other hyperlipidemia: Secondary | ICD-10-CM | POA: Diagnosis not present

## 2019-06-30 DIAGNOSIS — I10 Essential (primary) hypertension: Secondary | ICD-10-CM | POA: Diagnosis not present

## 2019-06-30 DIAGNOSIS — E785 Hyperlipidemia, unspecified: Secondary | ICD-10-CM | POA: Diagnosis not present

## 2019-07-01 ENCOUNTER — Telehealth: Payer: Self-pay

## 2019-07-01 NOTE — Telephone Encounter (Signed)
Lattie Haw RN [facility] [phone]) with [facility] called: Requesting an order for a in home hospital bed (dx COPD).   Patient cancelled her TC appointment here. She will need to contact her PCP or Pulmonary MD.

## 2019-07-04 ENCOUNTER — Encounter (HOSPITAL_COMMUNITY): Payer: Self-pay | Admitting: Vascular Surgery

## 2019-07-07 DIAGNOSIS — Z7901 Long term (current) use of anticoagulants: Secondary | ICD-10-CM | POA: Diagnosis not present

## 2019-07-07 DIAGNOSIS — I6523 Occlusion and stenosis of bilateral carotid arteries: Secondary | ICD-10-CM | POA: Diagnosis not present

## 2019-07-07 DIAGNOSIS — J449 Chronic obstructive pulmonary disease, unspecified: Secondary | ICD-10-CM | POA: Diagnosis not present

## 2019-07-07 DIAGNOSIS — I69312 Visuospatial deficit and spatial neglect following cerebral infarction: Secondary | ICD-10-CM | POA: Diagnosis not present

## 2019-07-07 DIAGNOSIS — I69354 Hemiplegia and hemiparesis following cerebral infarction affecting left non-dominant side: Secondary | ICD-10-CM | POA: Diagnosis not present

## 2019-07-07 DIAGNOSIS — I4891 Unspecified atrial fibrillation: Secondary | ICD-10-CM | POA: Diagnosis not present

## 2019-07-07 DIAGNOSIS — M19072 Primary osteoarthritis, left ankle and foot: Secondary | ICD-10-CM | POA: Diagnosis not present

## 2019-07-07 DIAGNOSIS — Z87891 Personal history of nicotine dependence: Secondary | ICD-10-CM | POA: Diagnosis not present

## 2019-07-07 DIAGNOSIS — S93402D Sprain of unspecified ligament of left ankle, subsequent encounter: Secondary | ICD-10-CM | POA: Diagnosis not present

## 2019-07-07 DIAGNOSIS — Z9981 Dependence on supplemental oxygen: Secondary | ICD-10-CM | POA: Diagnosis not present

## 2019-07-07 DIAGNOSIS — Z48812 Encounter for surgical aftercare following surgery on the circulatory system: Secondary | ICD-10-CM | POA: Diagnosis not present

## 2019-07-07 DIAGNOSIS — I10 Essential (primary) hypertension: Secondary | ICD-10-CM | POA: Diagnosis not present

## 2019-07-07 DIAGNOSIS — G40209 Localization-related (focal) (partial) symptomatic epilepsy and epileptic syndromes with complex partial seizures, not intractable, without status epilepticus: Secondary | ICD-10-CM | POA: Diagnosis not present

## 2019-07-07 DIAGNOSIS — J9611 Chronic respiratory failure with hypoxia: Secondary | ICD-10-CM | POA: Diagnosis not present

## 2019-07-07 DIAGNOSIS — I083 Combined rheumatic disorders of mitral, aortic and tricuspid valves: Secondary | ICD-10-CM | POA: Diagnosis not present

## 2019-07-07 DIAGNOSIS — F329 Major depressive disorder, single episode, unspecified: Secondary | ICD-10-CM | POA: Diagnosis not present

## 2019-07-07 DIAGNOSIS — K5904 Chronic idiopathic constipation: Secondary | ICD-10-CM | POA: Diagnosis not present

## 2019-07-07 DIAGNOSIS — Z853 Personal history of malignant neoplasm of breast: Secondary | ICD-10-CM | POA: Diagnosis not present

## 2019-07-07 DIAGNOSIS — F419 Anxiety disorder, unspecified: Secondary | ICD-10-CM | POA: Diagnosis not present

## 2019-07-07 DIAGNOSIS — I6522 Occlusion and stenosis of left carotid artery: Secondary | ICD-10-CM | POA: Diagnosis not present

## 2019-07-08 DIAGNOSIS — I69312 Visuospatial deficit and spatial neglect following cerebral infarction: Secondary | ICD-10-CM | POA: Diagnosis not present

## 2019-07-08 DIAGNOSIS — I69354 Hemiplegia and hemiparesis following cerebral infarction affecting left non-dominant side: Secondary | ICD-10-CM | POA: Diagnosis not present

## 2019-07-08 DIAGNOSIS — J449 Chronic obstructive pulmonary disease, unspecified: Secondary | ICD-10-CM | POA: Diagnosis not present

## 2019-07-08 DIAGNOSIS — I4891 Unspecified atrial fibrillation: Secondary | ICD-10-CM | POA: Diagnosis not present

## 2019-07-08 DIAGNOSIS — I6522 Occlusion and stenosis of left carotid artery: Secondary | ICD-10-CM | POA: Diagnosis not present

## 2019-07-08 DIAGNOSIS — Z48812 Encounter for surgical aftercare following surgery on the circulatory system: Secondary | ICD-10-CM | POA: Diagnosis not present

## 2019-07-11 ENCOUNTER — Encounter: Payer: Self-pay | Admitting: Neurology

## 2019-07-11 ENCOUNTER — Other Ambulatory Visit: Payer: Self-pay

## 2019-07-11 ENCOUNTER — Other Ambulatory Visit: Payer: Self-pay | Admitting: *Deleted

## 2019-07-11 ENCOUNTER — Ambulatory Visit (INDEPENDENT_AMBULATORY_CARE_PROVIDER_SITE_OTHER): Payer: Medicare Other | Admitting: Neurology

## 2019-07-11 VITALS — BP 184/88 | HR 110 | Ht 64.0 in | Wt 242.0 lb

## 2019-07-11 DIAGNOSIS — J449 Chronic obstructive pulmonary disease, unspecified: Secondary | ICD-10-CM

## 2019-07-11 DIAGNOSIS — I6529 Occlusion and stenosis of unspecified carotid artery: Secondary | ICD-10-CM

## 2019-07-11 DIAGNOSIS — I609 Nontraumatic subarachnoid hemorrhage, unspecified: Secondary | ICD-10-CM | POA: Diagnosis not present

## 2019-07-11 DIAGNOSIS — I6521 Occlusion and stenosis of right carotid artery: Secondary | ICD-10-CM

## 2019-07-11 DIAGNOSIS — I480 Paroxysmal atrial fibrillation: Secondary | ICD-10-CM | POA: Diagnosis not present

## 2019-07-11 DIAGNOSIS — F09 Unspecified mental disorder due to known physiological condition: Secondary | ICD-10-CM | POA: Diagnosis not present

## 2019-07-11 DIAGNOSIS — I63511 Cerebral infarction due to unspecified occlusion or stenosis of right middle cerebral artery: Secondary | ICD-10-CM

## 2019-07-11 DIAGNOSIS — G40219 Localization-related (focal) (partial) symptomatic epilepsy and epileptic syndromes with complex partial seizures, intractable, without status epilepticus: Secondary | ICD-10-CM

## 2019-07-11 NOTE — Progress Notes (Signed)
PATIENT: Beth [name] DOB: 1941-01-11  REASON FOR VISIT: follow up- seizures HISTORY FROM: patient with husband She is on a 02 concentrator.   HISTORY OF PRESENT ILLNESS:  I have the pleasure of meeting a long-term seizure patient Beth [name]. [place] on 07-11-2019- she is a Caucasian female right-handed and 79 years of age.  She is accompanied today by her husband.   She took her medication in the morning and was trying to take her medication when she noted a flaccid left hand- this was 05-16-2019-and she was found to have a had a stroke, a left carotid stenosis, and atrial fib, d/c 5-10 2021 to inpatient rehab.  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  She has not had seizures since December 2019 but she was hospitalized with a new diagnosis of stroke and atrial fibrillation.  She underwent a right-sided carotid endarterectomy of which the scar is still visible today, she continues to walk with a walker nonseated is on chronic oxygen supplementation.  At 3 L a minute.  She has a mild tremor in her right hand this is minor and her left hand remained fisted but she is able to extend the fingers and do repeated alternating movements visit.  Her speech is fluent. She does have bilateral severe leg edema.    02-07-2019: Rv for Beth [name], a long-time patient of our [place] practice. Mrs. Kasy Iannacone. [name] is a meanwhile 79 year old Caucasian right-handed female patient who is seen here after a 70-monthhiatus.  She has felt very isolated during the Covid pandemic, just got her first shot of the vaccine.  Mr. GMarketmemory has further declined over the last year and it has led to some sometimes 10 situations.  She has had a stable MoCA test and she is feeling that she is able to take very well care of herself.  She has not had recent seizure activity and not since her last visit with [place]  She just gotten a shipment of generic Keppra  since the insurance is not covering the brand name anymore.   She is due to her social isolation somewhat frustrated and she endorsed the geriatric depression scale accordingly at 6 out of 15 points.  This seems to be situational and circumstantial..   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 In 2010 she injured her foot severely during a seizure and many surgeries to the ankle.  She became oxygen dependent in 2018 , has COPD. A cognitive decline has been evidient [facility]. She has difficulties to structure her thoughts, is logorrhoeic and yet tangential, but this has improved with oxygen ! She is less fatigued. More alert. She reports today is very good day, and she also reports having very bad ones. Some days she is too tired , too fatigued to move.  Beth [name] had no interval seizure activity, this makes her seizure-free for 12 months.    She continues to take a baby aspirin, albuterol inhaler, atenolol tablets 25 mg her daily dose, captopril 2 times a day, vitamin D,  Lexapro, Allegra and Klonopin 2 mg at bedtime.  She also has Flonase nasal spray, Mucinex as needed for cough, she takes Keppra 750 mg twice a day, she also is on tiotropium  bromide, on Maxide and on ellipta ANORO.   The bedtime medication was needed for nocturnal agitation, and seizure and insomnia. We are discussing her last MRI brain 04-2017, with mild microvascular changes, but a larger area of glioses from [facility].     Montreal Cognitive Assessment  02/07/2019 10/14/2017 04/08/2016 10/10/2015 10/11/2014  Visuospatial/ Executive (0/5) '4 4 3 4 4  ' Naming (0/3) '3 3 2 1 3  ' Attention: Read list of digits (0/2) '1 2 2 2 2  ' Attention: Read list of letters (0/1) 1 0 '1 1 1  ' Attention: Serial 7 subtraction starting at 100 (0/3) '3 2 2 2 2  ' Language:  Repeat phrase (0/2) '1 2 2 2 2  ' Language : Fluency (0/1) 1 1 0 1 0  Abstraction (0/2) '2 2 2 2 2  ' Delayed Recall (0/5) '4 5 5 5 4  ' Orientation (0/6) '6 6 6 6 6  ' Total '26 27 25 26 26  ' Adjusted Score (based on education) - 27 26 - 27           04-13-2017, Patient is here for seizure follow up, concentration and memory problems.  [name] endorsed the geriatric depression score at 5 out of 15 points, which is a borderline result.  She states she had no seizure activity over the last 6-8 months, she is now using oxygen 24/7 portable tank is with her today. COPD  Beth [name] reports that some of her routines and rituals have changed-certainly over the last year- again she feels that she is less organized, she has often skipped certain activities that she used to do in order. At  one time she accused her husband of getting lost while he was driving, because she could not recognize the surroundings. Not cooking or backing any longer. 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      History : MM Beth [name] is a 79 year old female with a history of seizures. She returns today for follow-up. She continues to take Keppra 750 mg twice a day. She denies any seizure events. She is able to complete all ADLs independently. She is currently not operating a motor vehicle. In the past the patient has reported repeatedly trouble with her memory. At the last visit her memory score has remained stable. Patient reports that her memory continued to remained stable. She states that she is forgetful at times reports having a hard time recalling certain things such as a restaurant name. She states that she has been diagnosed with COPD. During this flare up she felt that she had an abnormal heart rhythm and was concerned about a potential stroke. However she reports she did not have any strokelike symptoms. She is currently on medication and inhalers to treat her COPD that she feels  is working well for her. She returns today for an evaluation.   HISTORY 04/10/15: MM- Ms. Amore is a 79 year old female with a history of seizures. She returns today for follow-up. She is currently taking Keppra 750 mg twice a day. She reports that she is tolerating this medication well. She denies any new neurological symptoms. She states at home she is able to complete all ADLs independently. She stopped driving after her stroke. 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 She states that at the last visit Dr. Brett Fairy ordered Belsomra and gave her samples however she did not start this medication. She states that she has continue using the Klonopin.  She reports that she has had some issues breathing and was given albuterol. She states that she is still having some difficulty breathing and her primary care mentioned a sleep study. She denies snoring. She denies any excessive daytime sleepiness. Denies a morning headache. She states that her husband has not witnessed any apnea events. The patient feels that her memory has remained stable. She just returned from traveling to see family members. She does report some numbness in the fingertips on the right hand. Occasionally will drop things in the right hand. However this time she does not want to proceed with any additional testing. She returns today for an evaluation.  HISTORY 10/11/14 [facility]): Patient returned for a yearly follow up. She reports medical problems: a carbuncle with MRSA, diagnosed spring 2016 and treated with ATB( doxicycline) by Dr. Amy Martinique , her Dermatologist . The patient tolerated the oral doxycycline fine. Mrs. Maulding's cognitive status remains stable we performed a [place] cognitive assessment test today and she scored well on the clock drawing . She has a mild tremor which impairs the contour of the clock face, she scored well on this Trail Making Test name 3 animals had some trouble again this tremor on  drawing a cube. Was able to do the attention tests but not subtraction. Language and abstraction were tested the patient only had a word fluency test of 9 points. She was able to score 4 out of 5 recall words and was fully oriented to date place and time. She scored 26 out of 30 points, is a HS graduate. 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 No seizures in the interval time.   PAULYNE MOOTY is a 79 y.o. female here seen originally many years ago as a referral from Dr.Kevin Little , Mrs. Inscoe is an established patient of my practice. 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  She also has a history of hypertension, osteopenia, vertigo and some mild hearing loss, classified as vestibulitis in her older records. Surgical history is positive for right foot fracture in May 2010 and 4 surgeries to the ankle. Her ankle was fractured in a seizure related incident 2010,  twisting her foot in the midst of her typical left twisting body movements during a seizure.  [name] brain injury in 2008 has affected her cognitively and she often struggles to find words. 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 She is taking 20 mg daily. She also takes vitamin D against fatigue. Her affect is better controlled and she feels more assured, and now is less socially isolated.  Mrs. Ruest had been seizure-free for over 12 months by the time of her April 2014 visit, now she reports that she had one seizure around Christmas 2014, while compliant on her current regimen of Keppra and Clonazepam.  She fills her medications at [facility] on [address] in [place].Vestibular evaluation for severe vertigo   REVIEW OF SYSTEMS: Out of a complete 14 system review of symptoms, the patient complains only of the following  symptoms, and all other reviewed systems are negative.  MCI - since stroke, small vessel; diease. Seizures.   ALLERGIES: Allergies  Allergen Reactions  . Prozac [Fluoxetine Hcl] Other (See Comments)    seizures  . Sulfa Antibiotics Nausea And Vomiting and Other (See Comments)    TINGLING IN  LEGS ALSO  . Micardis [Telmisartan] Swelling and Other (See Comments)    Swelling, bruising Swelling, bruising  . Prednisone Other (See Comments)    mood changes Mood changes  . Lisinopril-Hydrochlorothiazide Other (See Comments)    unknown  . Augmentin [Amoxicillin-Pot Clavulanate] Rash  . Avelox [Moxifloxacin Hcl In Nacl] Rash    LEVAQUIN IS OK-SEE NOTE  . Norvasc [Amlodipine Besylate] Other (See Comments)    fatigue    HOME MEDICATIONS: Outpatient Medications Prior to Visit  Medication Sig Dispense Refill  . acetaminophen (TYLENOL) 325 MG tablet Take 1-2 tablets (325-650 mg total) by mouth every 4 (four) hours as needed for mild pain.    Marland Kitchen alum & mag hydroxide-simeth (MAALOX/MYLANTA) 200-200-20 MG/5ML suspension Take 15-30 mLs by mouth every 2 (two) hours as needed for indigestion. 355 mL 0  . apixaban (ELIQUIS) 5 MG TABS tablet Take 1 tablet (5 mg total) by mouth 2 (two) times daily. 60 tablet 0  . aspirin EC 81 MG tablet Take 81 mg by mouth daily.    Marland Kitchen atorvastatin (LIPITOR) 20 MG tablet Take 1 tablet (20 mg total) by mouth daily. 90 tablet 3  . captopril (CAPOTEN) 50 MG tablet Take 50 mg by mouth 2 (two) times daily.    . Cholecalciferol (VITAMIN D3) 2000 units capsule Take 2,000 Units by mouth daily.     . clonazePAM (KLONOPIN) 2 MG tablet TAKE 1 TABLET AT BEDTIME (Patient taking differently: Take 2 mg by mouth at bedtime. For seizure activity or insomnia Jasiel Apachito.) 90 tablet 0  . clopidogrel (PLAVIX) 75 MG tablet Take 1 tablet (75 mg total) by mouth daily. 30 tablet 0  . diltiazem (CARDIZEM CD) 300 MG 24 hr capsule Take 1 capsule (300 mg total) by mouth daily. 30 capsule 0  .  famotidine (PEPCID) 20 MG tablet Take 1 tablet (20 mg total) by mouth 2 (two) times daily. 60 tablet 0  . fluticasone (FLONASE) 50 MCG/ACT nasal spray Place 1 spray into the nose daily as needed for allergies. Spray twice daily as needed     . gabapentin (NEURONTIN) 600 MG tablet Take 0.5 tablets (300 mg total) by mouth at bedtime. 30 tablet 0  . hydrocerin (EUCERIN) CREA Apply 1 application topically 2 (two) times daily.  0  . levETIRAcetam (KEPPRA) 750 MG tablet Take 750 mg by mouth 2 (two) times daily.    Marland Kitchen levETIRAcetam 750 MG TB3D Take 750 mg by mouth 2 (two) times daily. 180 tablet 0  . LEXAPRO 20 MG tablet Take 1 tablet (20 mg total) by mouth daily. Morning dosage 90 tablet 3  . loratadine (CLARITIN) 10 MG tablet Take 1 tablet (10 mg total) by mouth daily.    . Menthol-Methyl Salicylate (MUSCLE RUB) 10-15 % CREA Apply 1 application topically as needed for muscle pain. To neck and shoulder  0  . polyethylene glycol (MIRALAX / GLYCOLAX) 17 g packet Take 17 g by mouth daily. Purchase bottle over the counter and use capful daily 14 each 0  . PROAIR HFA 108 (90 Base) MCG/ACT inhaler USE 2 INHALATIONS EVERY 4 HOURS AS NEEDED FOR WHEEZING OR SHORTNESS OF BREATH 8.5 g 20  . sodium chloride (OCEAN) 0.65 % SOLN nasal spray Place 1 spray into both nostrils in the morning, at noon, in the evening, and at bedtime.  0  . STIOLTO RESPIMAT 2.5-2.5 MCG/ACT AERS INHALE 2 PUFFS INTO THE LUNGS DAILY 4 g 3  . vitamin B-12 (CYANOCOBALAMIN) 500 MCG tablet Take 1 tablet (500  mcg total) by mouth daily.     No facility-administered medications prior to visit.    PAST MEDICAL HISTORY: Past Medical History:  Diagnosis Date  . Afib ([place])    diagnosed 05/17/19  . Arthritis   . BRCA2 positive 2017  . COPD (chronic obstructive pulmonary disease) ([place])   . Diverticulosis   . Dysrhythmia    Afib  . Hearing loss   . HTN (hypertension)   . Memory loss   . Pre-diabetes   . Seasonal depression ([place])   . 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 Seizures [facility])    anniversary seizure - 1 year after serizure  . Stroke ([place])   . 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   . Vestibulitis of ear     PAST SURGICAL HISTORY: Past Surgical History:  Procedure Laterality Date  . broken ankle Right   . TONSILLECTOMY    . TRANSCAROTID ARTERY REVASCULARIZATION Right 06/20/2019  . TRANSCAROTID ARTERY REVASCULARIZATION Right 06/20/2019   Procedure: RIGHT TRANSCAROTID ARTERY REVASCULARIZATION;  Surgeon: Angelia Mould, MD;  Location[facility];  Service: Vascular;  Laterality: Right;    FAMILY HISTORY: Family History  Problem Relation Age of Onset  . Dementia Mother   . Stroke Mother   . Thyroid disease Mother   . Osteoporosis Mother   . Ovarian cancer Paternal Grandmother 56  . Ovarian cancer Paternal Aunt 89  . Breast cancer Daughter 2       breast ca x2, bilateral dx. 38, 46--estrogen  . Other Daughter        no genetic testing as of 07/2015; has had ovaries removed  . Other Daughter        BRCA 2 Pos.  . Colon cancer Maternal Aunt        dx. unspecified age  . Emphysema Maternal Uncle   . High blood pressure Maternal Grandmother   . Dementia Maternal Grandfather   . Bleeding Disorder Maternal Grandfather   . Emphysema Maternal Aunt   . Emphysema Maternal Aunt     SOCIAL HISTORY: Social History   Socioeconomic History  . Marital status: Married    Spouse name: Jenny Reichmann  . Number of children: 2  . Years of education: 51  . Highest education level: Not on file  Occupational History  . Not on file  Tobacco Use  . Smoking status: Former Smoker    Packs/day: 1.00    Years: 41.00    Pack years: 41.00    Quit date: 01/13/2006    Years since quitting: 13.4  . Smokeless tobacco: Never Used  Vaping Use  . Vaping Use: Never used  Substance and Sexual Activity  . Alcohol use: No    Alcohol/week: 0.0 standard drinks  . Drug use:  No  . Sexual activity: Never    Partners: Male    Birth control/protection: Post-menopausal  Other Topics Concern  . Not on file  Social History Narrative   Patient is married ([name]) and lives at home with her husband.   Patient is retired.   Patient has two children.   Patient has a high school education.   Patient is right-handed.   Patient drinks 2 big mugs of coffee daily and sometimes tea in the evenings.   Social Determinants of Health   Financial Resource Strain:   . Difficulty of Paying Living Expenses:   Food Insecurity:   . Worried About Charity fundraiser in the Last  Year:   . Ran Out of Food in the Last Year:   Transportation Needs:   . Film/video editor (Medical):   Marland Kitchen Lack of Transportation (Non-Medical):   Physical Activity:   . Days of Exercise per Week:   . Minutes of Exercise per Session:   Stress:   . Feeling of Stress :   Social Connections:   . Frequency of Communication with Friends and Family:   . Frequency of Social Gatherings with Friends and Family:   . Attends Religious Services:   . Active Member of Clubs or Organizations:   . Attends Archivist Meetings:   Marland Kitchen Marital Status:   Intimate Partner Violence:   . Fear of Current or Ex-Partner:   . Emotionally Abused:   Marland Kitchen Physically Abused:   . Sexually Abused:       PHYSICAL EXAM  Vitals:   07/11/19 1342  BP: (!) 184/88  Pulse: (!) 110  Weight: 242 lb (109.8 kg)  Height: '5\' 4"'  (1.626 m)   Body mass index is 41.54 kg/m.  Result History    Montreal Cognitive Assessment  02/07/2019 10/14/2017 04/08/2016 10/10/2015 10/11/2014  Visuospatial/ Executive (0/5) '4 4 3 4 4  ' Naming (0/3) '3 3 2 1 3  ' Attention: Read list of digits (0/2) '1 2 2 2 2  ' Attention: Read list of letters (0/1) 1 0 '1 1 1  ' Attention: Serial 7 subtraction starting at 100 (0/3) '3 2 2 2 2  ' Language: Repeat phrase (0/2) '1 2 2 2 2  ' Language : Fluency (0/1) 1 1 0 1 0  Abstraction (0/2) '2 2 2 2 2  ' Delayed Recall  (0/5) '4 5 5 5 4  ' Orientation (0/6) '6 6 6 6 6  ' Total '26 27 25 26 26  ' Adjusted Score (based on education) - 27 26 - 27     Generalized: Well developed, groomed, in no acute distress, on 02 for  24/7 now.    Neurological examination  Mentation: alert - today was able to state  time, place, history . She is a little anxious. She fears her husband develops dementia. She is tangential, and has been all over the place with her interval report, fluent speech, slightly dysphonic. MOCA.  Cranial nerve : loss of taste and smell- she craves sweets. Pupils were equal in size, both are round- and remained reactive to light.  Extraocular movements were full, visual field were full on confrontational test.  Facial sensation and strength were normal. Uvula and tongue move in midline, no swelling, no tremor. Neck stiffness- restricted ROM-  Head turning and shoulder shrug  were impaired.   soft touch is felt on all 4 extremities. Coordination:  finger-nose-maneuver is without  Gait and station: Gait deferred today.  Reflexes: Deep tendon reflexes are symmetric bilaterally.   DIAGNOSTIC DATA (LABS, IMAGING, TESTING) - I reviewed patient records, labs, notes, testing and imaging myself where available.  Lab Results  Component Value Date   WBC 8.7 06/22/2019   HGB 10.2 (L) 06/22/2019   HCT 32.1 (L) 06/22/2019   MCV 102.6 (H) 06/22/2019   PLT 224 06/22/2019      Component Value Date/Time   NA 138 06/20/2019 0657   K 3.8 06/20/2019 0657   CL 101 06/20/2019 0657   CO2 28 06/20/2019 0657   GLUCOSE 139 (H) 06/20/2019 0657   BUN 11 06/20/2019 0657   CREATININE 0.72 06/20/2019 0657   CREATININE 0.85 02/22/2018 1051   CALCIUM 8.9 06/20/2019 0657  PROT 6.7 06/20/2019 0657   ALBUMIN 3.3 (L) 06/20/2019 0657   AST 18 06/20/2019 0657   AST 18 02/22/2018 1051   ALT 16 06/20/2019 0657   ALT 16 02/22/2018 1051   ALKPHOS 54 06/20/2019 0657   BILITOT 0.3 06/20/2019 0657   BILITOT 0.6 02/22/2018 1051    GFRNONAA >60 06/20/2019 0657   GFRNONAA >60 02/22/2018 1051   GFRAA >60 06/20/2019 0657   GFRAA >60 02/22/2018 1051    ASSESSMENT AND PLAN:  79 y.o. 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 ), and Vestibulitis of ear. here with:  FINDINGS: CTA NECK FINDINGS  Aortic arch: Calcified and noncalcified plaque along the arch and patent great vessel origins.  Right carotid system: Common carotid is patent with atherosclerotic wall thickening and partially retropharyngeal course. There is primarily calcified plaque at the bifurcation extending along the proximal internal carotid causing approximately 55% stenosis. Remainder of the cervical ICA is patent. There is high-grade stenosis at the external carotid origin.  Left carotid system: Common carotid is patent with atherosclerotic wall thickening. Internal and external carotid arteries are patent. There is mild calcified plaque along the proximal ICA without measurable stenosis.  Vertebral arteries: Patent.  No measurable stenosis. Skeleton: Degenerative changes of the cervical spine. Other neck: No mass or adenopathy. Upper chest: Emphysema. Review of the MIP images confirms the above findings  06/20/2019 Pre-operative Diagnosis: Symptomatic right carotid stenosis Post-operative diagnosis:  Same Surgeon:  Erlene Quan C. Donzetta Matters, MD Assistant: Gae Gallop, MD Procedure Performed:  Right transcarotid artery stenting with 8 x 40 en route stent with flow reversal neuro protection  Indications: 79 year old female with history of symptomatic right carotid  lesion.  This was noted to be too high for surgical accessibility was indicated for transcarotid artery stenting.  Findings: We appeared to have dissection flap created right at the level of the sheath entry.  This initially prevented passage of wire we were able to ultimately get a wire under the petrous portion of the ICA on the right.  There was heavy calcification at the carotid bifurcation.  After stenting we initially had a waist which was ballooned with a 6 mm balloon.  At completion there was good flow through the carotid stent in 2 views there was no residual dissection.              Procedure:  The patient was identified in the holding area and taken to the operating room where she was put supine on the operative when general anesthesia was induced.  She was sterilely prepped and draped in the neck and chest in usual fashion antibiotics were minister timeout was called.  Ultrasound was used to identify the common carotid artery between the 2 heads of the sternocleidomastoid marked the incision on the neck.  We then cannulated the left common femoral vein with micropuncture needle followed the wire and sheath.  Bentson wires placed followed by the 8 [place] sheath this was sutured to the skin and flushed with heparinized saline.  Concomitantly longitudinal incision was made down through the 2 heads of the sternocleidomastoid.  We divided the omohyoid muscle.  We dissected down to the IJ identified the vagus nerve these were reflected laterally.  Common carotid was identified patient was given 11,000 units of heparin.  ACT did returned greater than 300.  We placed a figure-of-eight type stitch in the common carotid artery with 5-0 Prolene suture.  We placed a vessel loop in a Potts configuration as well as umbilical tape around the common carotid artery.  We then cannulated the common carotid artery with micropuncture needle and placed a wire and sheath to 3 cm.  We performed angiography.  We then  exchanged for the J. Amplatz wire and placed our 8 French carotid sheath.  This was sutured to the skin and connected to flow reversal flow reversal was confirmed although was somewhat sluggish.  We performed TCAR timeout systolic blood pressure was 160 heart rate was in the mid 70s ACT was greater than 300.  We then prepared a balloon and wire.  We attempted to cross the lesion.  This unfortunately.  To be in a dissection plane.  We ultimately had to pull back on the sheath direct the wire with a bare catheter were able to get this in the right plane.  We then had the wire unfortunately at the [facility] confirmed with angiography.  We then able to redirect the use of Berenstein catheter into the ICA.  The lesion was primary balloon dilated with 5 mm balloon.  The stent was brought to the table this was put in place and deployed.  We did at the retractor on the sheath in order to deploy this down to the common carotid artery.  After 2 minutes we then performed angiography there appeared to be a waste.  This was then dilated with 6 mm balloon.  We confirmed angiography and 2 views now appearing to have good flow with no residual dissection.  Satisfied with this we removed our wire we removed flow reversal which was a total time of 30 minutes and 55 seconds.  We removed the sheath from the neck and suture-ligated 050 Prolene suture there was good hemostasis.  We return the blood via the groin.  50 mg of protamine was administered.  We obtain hemostasis and the wound irrigated.  Neck wound was closed in layers with Vicryl suture.  The sheath from the groin was pulled pressure was held.  She was awake from anesthesia having tolerated procedure well at any complication was transferred to the PACU in stable condition.   CTA HEAD FINDINGS  Anterior circulation: Intracranial internal carotid arteries patent with minimal calcified plaque. Anterior and middle cerebral arteries are patent.  Posterior circulation:  Intracranial vertebral arteries basilar artery, and posterior cerebral arteries are patent.  Venous sinuses: As permitted by contrast timing, patent.  Review of the MIP images confirms the above findings  IMPRESSION: No large vessel occlusion. Plaque at the right ICA origin causes approximately 55% stenosis. There is no measurable stenosis at the left ICA origin. IMPRESSIONS    1. Left ventricular ejection fraction, by estimation, is 55 to 60%. The  left ventricle has normal function. Left ventricular endocardial border  not optimally defined to evaluate regional wall motion. Left ventricular  diastolic function could not be  evaluated.  2. Right ventricular systolic function was not well visualized. The right  ventricular size is not well visualized.  3. The mitral valve is grossly normal. Mild mitral valve regurgitation.  4. The aortic valve was not well visualized. Aortic valve regurgitation  is not visualized. No aortic stenosis is present.  5. Pulmonic valve regurgitation not well visualized.   Conclusion(s)/Recommendation(s): No intracardiac  source of embolism  detected on this transthoracic study. A transesophageal echocardiogram is  recommended to exclude cardiac source of embolism if clinically indicated.  Technically challenging study.  Recommend use of echo contrast for future studies. Prominent pericardial  fat pad but only very small effusion. LV function globally appears normal,  but cannot exclude small focal wall motion abnormalities.   FINDINGS  Left Ventricle: Left ventricular ejection fraction, by estimation, is 55  to 60%. The left ventricle has normal function. Left ventricular  endocardial border not optimally defined to evaluate regional wall motion.  The left ventricular internal cavity  size was normal in size. There is no left ventricular hypertrophy. Left  ventricular diastolic function could not be evaluated.   Right Ventricle: The right  ventricular size is not well visualized. Right  vetricular wall thickness was not assessed. Right ventricular systolic  function was not well visualized.   Left Atrium: Left atrial size was not well visualized.   Right Atrium: Right atrial size was not well visualized.   Pericardium: A small pericardial effusion is present. Presence of  pericardial fat pad.   Mitral Valve: The mitral valve is grossly normal. Mild mitral annular  calcification. Mild mitral valve regurgitation.   Tricuspid Valve: The tricuspid valve is grossly normal. Tricuspid valve  regurgitation is trivial. No evidence of tricuspid stenosis.   Aortic Valve: The aortic valve was not well visualized. Aortic valve  regurgitation is not visualized. No aortic stenosis is present.   Pulmonic Valve: The pulmonic valve was not well visualized. Pulmonic valve  regurgitation not well visualized.   Aorta: The aortic root was not well visualized.   Venous: The inferior vena cava was not well visualized.   IAS/Shunts: The interatrial septum was not well visualized.     AORTIC VALVE  LVOT Vmax:  79.20 cm/s  LVOT Vmean: 50.200 cm/s  LVOT VTI:  0.132 m   MITRAL VALVE  MV Area (PHT): 3.42 cm  SHUNTS  MV Decel Time: 222 msec  Systemic VTI: 0.13 m  MV E velocity: 85.70 cm/s   Buford Dresser MD  Electronically signed by Buford Dresser MD  Signature Date/Time: 05/17/2019/7:25:16 PM      Final   Imaging Info  ECHOCARDIOGRAM COMPLETE (Order #[id number]) on 05/16/19  Study History  ECHOCARDIOGRAM COMPLETE (Order #[id number]) on 05/16/19  Syngo Images  Show images for ECHOCARDIOGRAM COMPLETE Images on [employer]  Show images for [name] Performing Technologist/Nurse  Performing Technologist/Nurse: Jannett Celestine  Reason for Exam Priority: Routine Not on file  Patient Data  Height 64 in    BP 158/85 mmHg       Surgical History  Surgical History  No past medical  history on file.    Other Surgical History  Procedure Laterality Date Comment Source  broken ankle Right     TONSILLECTOMY      TRANSCAROTID ARTERY REVASCULARIZATION Right 06/20/2019    TRANSCAROTID ARTERY REVASCULARIZATION Right 06/20/2019 Procedure: RIGHT TRANSCAROTID ARTERY REVASCULARIZATION; Surgeon: Angelia Mould, MD; Location: [facility]; Service: Vascular; Laterality: Right;     Implants   Permanent Stent  Stent Transcarotid System 8x40 - [id number] - Implanted (Right) Common Carotid Art Inventory item: [place] 8X40 Model/Cat number: [id number]  Manufacturer: [place] Lot number: [id number]  Size: 18m x 446mArea Of Implantation: Common Carotid Art  As of 06/20/2019 Status: Implanted      Encounter-Level Documents - 05/16/2019:  Scan on 06/09/2019 10:41 AM by Default, Provider, MD Document  on 05/20/2019 5:11 PM by Dietrich Pates: ED PB Billing Extract Scan on 05/24/2019 7:50 AM by Default, Provider, MD Scan on 05/24/2019 9:42 AM by Default, Provider, MD Scan on 05/16/2019 6:46 PM by Default, Provider, MD Electronic signature on 05/16/2019 12:23 PM - E-signed Electronic signature on 05/16/2019 12:23 PM - E-signed Scan on 05/17/2019 11:19 AM by Carmon Ginsberg Documents - 05/16/2019:  Scan on 05/18/2019 3:18 PM by Default, Provider, MD Scan on 05/17/2019 7:25 PM by Default, Provider, MD     Resulted by:  Signed Date/Time  Phone Pager  Buford Dresser 05/17/2019 7:25 PM [phone]   External Result Report  External Result Report  ECHOCARDIOGRAM COMPLETE: Patient Communication  Add Comments Seen      Electronically Signed   By: Macy Mis M.D.   On: 05/16/2019 12:26 Brain: There is a small acute right MCA territory infarct primarily involving cortex in the frontoparietal region. The posterior aspect of the right insula is also involved. A large chronic infarct is again noted laterally in the right  temporal lobe with a small amount of associated chronic blood products. Patchy T2 hyperintensities in the cerebral white matter bilaterally have mildly progressed from the prior MRI and are nonspecific but compatible with mildly age advanced chronic small vessel ischemic disease. There is mild global cerebral atrophy. No mass, midline shift, or extra-axial fluid collection is identified.  Vascular: Major intracranial vascular flow voids are preserved.  Skull and upper cervical spine: No suspicious marrow lesion. Moderate disc and facet degeneration in the cervical spine.  Sinuses/Orbits: Bilateral cataract extraction. Unchanged left maxillary sinus mucous retention cyst. Clear mastoid air cells.  Other: None.  IMPRESSION: 1. Small acute right MCA infarct. 2. Chronic right temporal lobe infarct. 3. Mild chronic small vessel ischemic disease.   Electronically Signed   By: Logan Bores M.D.   On: 05/16/2019 14:42  A)  New stroke - left sided weakness reb newed and more svere but improving gradually in rehab- . 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 I have not found ECHO trans oesophageal , only TT-Echo.   1. 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   2. Memory disturbance, related to stroke, bleed and progressive Dementia- MOCA in 04/2017 to 24/ 30 points, 2021;  26/30 points. 3. Encephalopathy has improved with oxygen supplementation- more alert, more oriented. She is on 02 for 24/ 7.  4. Gait instability, beginning with ankle fracture in 2010 and now wide based. Walks still without a cane. Insecure when climbing stairs. She is going out very little - due to COVID 19 . Husband is also gait impaired and has progressive memory loss. .   Atrial fibrillation was diagnosed, causing SOB, ankle edema and requires her to stay on O2. Has  tachycardia.  She has already been on beta blocker and is now anticoagulation , for indefinite time.   She'll continue on Keppra 750 mg twice a day. She is now using generic Keppra- .   We will continue to monitor her by [facility] tests next visit in 6 month   If her husband is truly as cognitively impaired the couple may need a care taker to come in, likely the couple's daughter in [place]- for help with medication, financial affairs, taxes , etc. No driving !   Follow-up in 6 months again with  me. MOCA , refills if needed.    Larey Seat, MD  07/11/2019, 1:54 PM [facility] [address], [place], [place] [address] [phone]

## 2019-07-11 NOTE — Patient Instructions (Signed)
Rehabilitation After a Stroke, Adult A stroke causes damage to the brain cells, which can affect your ability to walk, talk, or remember things. The impact of a stroke is different for everyone, and so is recovery. Some people have progress during the first few days after treatment. Others may take weeks or longer to make progress. Stroke rehabilitation includes a variety of treatments to help you recover and promote your independence after a stroke. You may not be able do everything that you did before the stroke, but you can learn ways to manage your lifestyle and be as independent as possible. Rehabilitation will start as soon as you are able to participate after your stroke, and it involves care from a team that may include:  Family and friends. Your loved ones know you best and can be very helpful in your recovery.  Physicians.  Nurses.  Physical and occupational therapists.  Speech-language therapists.  A nutritionist.  A psychologist.  A social worker. Keep open communication with all members of your care team. Share your medical records if needed, and take notes about each provider's recommendations. What is physical therapy? Physical therapists (PTs) help you to improve your coordination, balance, and muscle strength. Physical therapy may involve:  Range of motion exercises.  Help to move between lying, sitting, and standing positions.  Walking with a cane or walker, if needed.  Help using stairs. What is occupational therapy? Occupational therapists (OTs) help you rebuild your ability to do everyday tasks, such as brushing your teeth, going to the bathroom, eating, and getting dressed. Occupational therapy may also help with:  Vision. Visual scanning is a technique that is used to prevent falls.  Memory and cognitive training. This therapy includes problem-solving techniques and relearning tasks like making a phone call.  Fine muscle movements such as buttoning a shirt  or picking up small objects. What is speech therapy? Speech-language therapists help you communicate. After a stroke, you may have problems understanding what people are saying, or you may have trouble writing, speaking, or finding the right word for what you want to say. You may also need speech therapy if you have difficulty swallowing while eating and drinking. Examples of speech-language therapies include:  Techniques to strengthen muscles used in swallowing.  Naming objects or describing pictures. This helps retrain the brain to recognize and remember words.  Exercises to strengthen the muscles involved in talking, including your tongue and lips.  Exercises to retrain your brain in understanding what you read and hear. How often will I need therapy? Therapy will begin as soon as you are able to participate, which is often within the first few days after a stroke. Sessions will be frequent at first. For example, you may have therapy 2-3 hours a day on most days of the week during the first few months. The intensity depends on the type and severity of your stroke. You may need therapy for several months. Therapy may take place in the hospital, at a rehabilitation center, or in your home. Are there any side effects of therapy? Therapy is safe and is usually well-tolerated. You may feel physically and mentally tired after therapy, especially during the first few weeks. Rest before therapy sessions if you need to so you can get the most out of your rehabilitation. Follow these instructions at home:  Involve your family and friends in your recovery, if possible. Having another person to encourage you is beneficial.  Follow instructions from your speech-language therapist, nutritionist, or health care   provider about what you can safely eat and drink. Eat healthy foods. If your ability to swallow was affected by the stroke, you may need to take steps to avoid choking, such as: ? Taking small bites  when eating. ? Eating foods that are soft or pureed. ? Drinking liquids that have been thickened.  Maintain social connections and interactions with friends, family, and community groups. This is an important part of your recovery. Communication challenges and physical challenges may cause you to feel isolated after a stroke.  Consider joining a support group that allows you to talk about the impact of stroke on your life. A psychologist or counselor may be recommended. Your emotional recovery from stroke is just as important as your physical recovery.  Keep all follow-up visits as told by your health care providers. This is important. Summary  Stroke rehabilitation includes a variety of treatments to help you recover and promote your independence after a stroke.  Rehabilitation will start as soon as you are able to participate after your stroke, and it includes care from a team of experts.  The intensity of therapy depends on the type and severity of your stroke. You may need therapy for several months. This information is not intended to replace advice given to you by your health care provider. Make sure you discuss any questions you have with your health care provider. Document Revised: 04/21/2018 Document Reviewed: 01/01/2016 Elsevier Patient Education  2020 Elsevier Inc.  

## 2019-07-12 DIAGNOSIS — Z48812 Encounter for surgical aftercare following surgery on the circulatory system: Secondary | ICD-10-CM | POA: Diagnosis not present

## 2019-07-12 DIAGNOSIS — I69312 Visuospatial deficit and spatial neglect following cerebral infarction: Secondary | ICD-10-CM | POA: Diagnosis not present

## 2019-07-12 DIAGNOSIS — I4891 Unspecified atrial fibrillation: Secondary | ICD-10-CM | POA: Diagnosis not present

## 2019-07-12 DIAGNOSIS — I6522 Occlusion and stenosis of left carotid artery: Secondary | ICD-10-CM | POA: Diagnosis not present

## 2019-07-12 DIAGNOSIS — J449 Chronic obstructive pulmonary disease, unspecified: Secondary | ICD-10-CM | POA: Diagnosis not present

## 2019-07-12 DIAGNOSIS — I69354 Hemiplegia and hemiparesis following cerebral infarction affecting left non-dominant side: Secondary | ICD-10-CM | POA: Diagnosis not present

## 2019-07-14 DIAGNOSIS — Z48812 Encounter for surgical aftercare following surgery on the circulatory system: Secondary | ICD-10-CM | POA: Diagnosis not present

## 2019-07-14 DIAGNOSIS — I4891 Unspecified atrial fibrillation: Secondary | ICD-10-CM | POA: Diagnosis not present

## 2019-07-14 DIAGNOSIS — I69312 Visuospatial deficit and spatial neglect following cerebral infarction: Secondary | ICD-10-CM | POA: Diagnosis not present

## 2019-07-14 DIAGNOSIS — J449 Chronic obstructive pulmonary disease, unspecified: Secondary | ICD-10-CM | POA: Diagnosis not present

## 2019-07-14 DIAGNOSIS — I69354 Hemiplegia and hemiparesis following cerebral infarction affecting left non-dominant side: Secondary | ICD-10-CM | POA: Diagnosis not present

## 2019-07-14 DIAGNOSIS — I6522 Occlusion and stenosis of left carotid artery: Secondary | ICD-10-CM | POA: Diagnosis not present

## 2019-07-15 DIAGNOSIS — I69354 Hemiplegia and hemiparesis following cerebral infarction affecting left non-dominant side: Secondary | ICD-10-CM | POA: Diagnosis not present

## 2019-07-15 DIAGNOSIS — I69312 Visuospatial deficit and spatial neglect following cerebral infarction: Secondary | ICD-10-CM | POA: Diagnosis not present

## 2019-07-15 DIAGNOSIS — Z48812 Encounter for surgical aftercare following surgery on the circulatory system: Secondary | ICD-10-CM | POA: Diagnosis not present

## 2019-07-15 DIAGNOSIS — I4891 Unspecified atrial fibrillation: Secondary | ICD-10-CM | POA: Diagnosis not present

## 2019-07-15 DIAGNOSIS — J449 Chronic obstructive pulmonary disease, unspecified: Secondary | ICD-10-CM | POA: Diagnosis not present

## 2019-07-15 DIAGNOSIS — I6522 Occlusion and stenosis of left carotid artery: Secondary | ICD-10-CM | POA: Diagnosis not present

## 2019-07-20 ENCOUNTER — Telehealth: Payer: Self-pay | Admitting: *Deleted

## 2019-07-20 ENCOUNTER — Other Ambulatory Visit: Payer: Self-pay

## 2019-07-20 ENCOUNTER — Encounter: Payer: Self-pay | Admitting: Vascular Surgery

## 2019-07-20 ENCOUNTER — Ambulatory Visit (HOSPITAL_COMMUNITY)
Admission: RE | Admit: 2019-07-20 | Discharge: 2019-07-20 | Disposition: A | Discharge status: Home / Self Care | Payer: Medicare Other | Source: Ambulatory Visit | Attending: Vascular Surgery | Admitting: Vascular Surgery

## 2019-07-20 ENCOUNTER — Ambulatory Visit (INDEPENDENT_AMBULATORY_CARE_PROVIDER_SITE_OTHER): Payer: Self-pay | Admitting: Vascular Surgery

## 2019-07-20 VITALS — BP 136/74 | HR 102 | Temp 97.6°F | Resp 20 | Ht 64.0 in | Wt 242.0 lb

## 2019-07-20 DIAGNOSIS — Z48812 Encounter for surgical aftercare following surgery on the circulatory system: Secondary | ICD-10-CM

## 2019-07-20 DIAGNOSIS — I6529 Occlusion and stenosis of unspecified carotid artery: Secondary | ICD-10-CM

## 2019-07-20 DIAGNOSIS — I6521 Occlusion and stenosis of right carotid artery: Secondary | ICD-10-CM

## 2019-07-20 NOTE — Progress Notes (Signed)
Patient name: Beth [name] MRN: [id number] DOB: 1940-03-01 Sex: female  REASON FOR VISIT:   Follow-up after right transcarotid stent.  HPI:   Beth [name] is a pleasant 79 y.o. female who I saw in consultation on 05/17/2019 with a symptomatic right carotid stenosis.  She had had a right brain stroke.  She had a high bifurcation on the right and I did not think the stenosis was surgically accessible.  I thought that she would be a candidate for transcarotid stenting after reviewing the films with Dr. Donzetta Matters.  Of note she had no significant carotid stenosis on the left.  She underwent transcarotid stenting on 06/20/2019 with an 8 x 40 stent with flow reversal neuro protection.  She comes in for her first outpatient visit.  Since I saw her last she is continuing to make progress with strength in her left upper extremity.  She still dropping things occasionally.  She she does not describe any new focal weakness or paresthesias.  She is on aspirin, Plavix, and a statin.  She had also been placed on Eliquis I believe because of atrial fibrillation with a rapid ventricular response.  This was during her admission for the stroke prior to when she was readmitted for transcarotid stenting.  She states that she does not have a cardiologist.   Current Outpatient Medications  Medication Sig Dispense Refill  . acetaminophen (TYLENOL) 325 MG tablet Take 1-2 tablets (325-650 mg total) by mouth every 4 (four) hours as needed for mild pain.    Marland Kitchen alum & mag hydroxide-simeth (MAALOX/MYLANTA) 200-200-20 MG/5ML suspension Take 15-30 mLs by mouth every 2 (two) hours as needed for indigestion. 355 mL 0  . apixaban (ELIQUIS) 5 MG TABS tablet Take 1 tablet (5 mg total) by mouth 2 (two) times daily. 60 tablet 0  . aspirin EC 81 MG tablet Take 81 mg by mouth daily.    Marland Kitchen atorvastatin (LIPITOR) 20 MG tablet Take 1 tablet (20 mg total) by mouth daily. 90 tablet 3  . captopril (CAPOTEN) 50 MG tablet Take 50 mg by mouth 2  (two) times daily.    . Cholecalciferol (VITAMIN D3) 2000 units capsule Take 2,000 Units by mouth daily.     . clonazePAM (KLONOPIN) 2 MG tablet TAKE 1 TABLET AT BEDTIME (Patient taking differently: Take 2 mg by mouth at bedtime. For seizure activity or insomnia dohmeier.) 90 tablet 0  . clopidogrel (PLAVIX) 75 MG tablet Take 1 tablet (75 mg total) by mouth daily. 30 tablet 0  . diltiazem (CARDIZEM CD) 300 MG 24 hr capsule Take 1 capsule (300 mg total) by mouth daily. 30 capsule 0  . famotidine (PEPCID) 20 MG tablet Take 1 tablet (20 mg total) by mouth 2 (two) times daily. 60 tablet 0  . fluticasone (FLONASE) 50 MCG/ACT nasal spray Place 1 spray into the nose daily as needed for allergies. Spray twice daily as needed     . gabapentin (NEURONTIN) 600 MG tablet Take 0.5 tablets (300 mg total) by mouth at bedtime. 30 tablet 0  . hydrocerin (EUCERIN) CREA Apply 1 application topically 2 (two) times daily.  0  . levETIRAcetam (KEPPRA) 750 MG tablet Take 750 mg by mouth 2 (two) times daily.    Marland Kitchen levETIRAcetam 750 MG TB3D Take 750 mg by mouth 2 (two) times daily. 180 tablet 0  . LEXAPRO 20 MG tablet Take 1 tablet (20 mg total) by mouth daily. Morning dosage 90 tablet 3  . loratadine (CLARITIN) 10 MG  tablet Take 1 tablet (10 mg total) by mouth daily.    . Menthol-Methyl Salicylate (MUSCLE RUB) 10-15 % CREA Apply 1 application topically as needed for muscle pain. To neck and shoulder  0  . polyethylene glycol (MIRALAX / GLYCOLAX) 17 g packet Take 17 g by mouth daily. Purchase bottle over the counter and use capful daily 14 each 0  . PROAIR HFA 108 (90 Base) MCG/ACT inhaler USE 2 INHALATIONS EVERY 4 HOURS AS NEEDED FOR WHEEZING OR SHORTNESS OF BREATH 8.5 g 20  . sodium chloride (OCEAN) 0.65 % SOLN nasal spray Place 1 spray into both nostrils in the morning, at noon, in the evening, and at bedtime.  0  . STIOLTO RESPIMAT 2.5-2.5 MCG/ACT AERS INHALE 2 PUFFS INTO THE LUNGS DAILY 4 g 3  . vitamin B-12  (CYANOCOBALAMIN) 500 MCG tablet Take 1 tablet (500 mcg total) by mouth daily.     No current facility-administered medications for this visit.    REVIEW OF SYSTEMS:  [X]  denotes positive finding, [ ]  denotes negative finding Vascular    Leg swelling    Cardiac    Chest pain or chest pressure:    Shortness of breath upon exertion:    Short of breath when lying flat:    Irregular heart rhythm:    Constitutional    Fever or chills:     PHYSICAL EXAM:   Vitals:   07/20/19 0854 07/20/19 0859  BP: 138/84 136/74  Pulse: (!) 102   Resp: 20   Temp: 97.6 F (36.4 C)   SpO2: 91%   Weight: 242 lb (109.8 kg)   Height: 5\' 4"  (1.626 m)     GENERAL: The patient is a well-nourished female, in no acute distress. The vital signs are documented above. CARDIOVASCULAR: There is a regular rate and rhythm. PULMONARY: There is good air exchange bilaterally without wheezing or rales. VASCULAR: Her incision is healing nicely. I do not detect carotid bruits.  DATA:   CAROTID DUPLEX: I have independently interpreted her carotid duplex scan today.  On the right side there is no evidence of thrombus, dissection, or atherosclerotic plaque.  There is no significant stenosis in the carotid stent is widely patent.  The left side has no significant stenosis.  MEDICAL ISSUES:   STATUS POST TRANSCAROTID STENTING: The patient is doing well status post right transcarotid stenting.  She continues to improve neurologically.  Her duplex scan shows that the stent is widely patent and she has no significant stenosis on the left side.  She has now completed 30 days of Plavix and given that she is on Eliquis for her atrial fibrillation I think she can stop her Plavix at this point.  I have instructed her to continue her aspirin and statin.  With respect to her Eliquis she was placed on that during her admission for her stroke by the medical service because she had atrial fibrillation with a rapid ventricular  response.  If she comes off of Eliquis at any point I think she could be restarted on Plavix however I suspect she will be on this although I will leave this up to her primary care physician Dr. Sabra Heck.  I do not think cardiology has been involved with this.  I have ordered a follow-up carotid duplex scan in 9 months and I will see her back at that time.  She knows to call sooner if she has problems.  [name] [facility] [phone]

## 2019-07-20 NOTE — Telephone Encounter (Signed)
Patient seen today by Dr Scot Dock she called back to ask a couple questions she was asking about showering and about getting a hospital bed spoke with Dr Scot Dock ok to shower patient will need to talk to PCP regarding hospital bed we do not order this. Patient verbalized understanding she will contact her PCP regarding request for hospital bed.

## 2019-07-21 DIAGNOSIS — I6522 Occlusion and stenosis of left carotid artery: Secondary | ICD-10-CM | POA: Diagnosis not present

## 2019-07-21 DIAGNOSIS — I69312 Visuospatial deficit and spatial neglect following cerebral infarction: Secondary | ICD-10-CM | POA: Diagnosis not present

## 2019-07-21 DIAGNOSIS — J449 Chronic obstructive pulmonary disease, unspecified: Secondary | ICD-10-CM | POA: Diagnosis not present

## 2019-07-21 DIAGNOSIS — Z48812 Encounter for surgical aftercare following surgery on the circulatory system: Secondary | ICD-10-CM | POA: Diagnosis not present

## 2019-07-21 DIAGNOSIS — I4891 Unspecified atrial fibrillation: Secondary | ICD-10-CM | POA: Diagnosis not present

## 2019-07-21 DIAGNOSIS — I69354 Hemiplegia and hemiparesis following cerebral infarction affecting left non-dominant side: Secondary | ICD-10-CM | POA: Diagnosis not present

## 2019-07-25 ENCOUNTER — Other Ambulatory Visit: Payer: Self-pay | Admitting: *Deleted

## 2019-07-25 DIAGNOSIS — E7849 Other hyperlipidemia: Secondary | ICD-10-CM | POA: Diagnosis not present

## 2019-07-25 DIAGNOSIS — I10 Essential (primary) hypertension: Secondary | ICD-10-CM | POA: Diagnosis not present

## 2019-07-25 DIAGNOSIS — R7303 Prediabetes: Secondary | ICD-10-CM | POA: Diagnosis not present

## 2019-07-25 DIAGNOSIS — E559 Vitamin D deficiency, unspecified: Secondary | ICD-10-CM | POA: Diagnosis not present

## 2019-07-25 DIAGNOSIS — H6121 Impacted cerumen, right ear: Secondary | ICD-10-CM | POA: Diagnosis not present

## 2019-07-25 DIAGNOSIS — Z95828 Presence of other vascular implants and grafts: Secondary | ICD-10-CM | POA: Diagnosis not present

## 2019-07-25 DIAGNOSIS — I6930 Unspecified sequelae of cerebral infarction: Secondary | ICD-10-CM | POA: Diagnosis not present

## 2019-07-25 DIAGNOSIS — J018 Other acute sinusitis: Secondary | ICD-10-CM | POA: Diagnosis not present

## 2019-07-25 DIAGNOSIS — M791 Myalgia, unspecified site: Secondary | ICD-10-CM | POA: Diagnosis not present

## 2019-07-25 DIAGNOSIS — R600 Localized edema: Secondary | ICD-10-CM | POA: Diagnosis not present

## 2019-07-25 DIAGNOSIS — I6521 Occlusion and stenosis of right carotid artery: Secondary | ICD-10-CM

## 2019-07-25 DIAGNOSIS — F3341 Major depressive disorder, recurrent, in partial remission: Secondary | ICD-10-CM | POA: Diagnosis not present

## 2019-07-25 DIAGNOSIS — I4891 Unspecified atrial fibrillation: Secondary | ICD-10-CM | POA: Diagnosis not present

## 2019-07-28 DIAGNOSIS — Z48812 Encounter for surgical aftercare following surgery on the circulatory system: Secondary | ICD-10-CM | POA: Diagnosis not present

## 2019-07-28 DIAGNOSIS — J449 Chronic obstructive pulmonary disease, unspecified: Secondary | ICD-10-CM | POA: Diagnosis not present

## 2019-07-28 DIAGNOSIS — I4891 Unspecified atrial fibrillation: Secondary | ICD-10-CM | POA: Diagnosis not present

## 2019-07-28 DIAGNOSIS — I69312 Visuospatial deficit and spatial neglect following cerebral infarction: Secondary | ICD-10-CM | POA: Diagnosis not present

## 2019-07-28 DIAGNOSIS — I69354 Hemiplegia and hemiparesis following cerebral infarction affecting left non-dominant side: Secondary | ICD-10-CM | POA: Diagnosis not present

## 2019-07-28 DIAGNOSIS — I6522 Occlusion and stenosis of left carotid artery: Secondary | ICD-10-CM | POA: Diagnosis not present

## 2019-08-02 DIAGNOSIS — I6522 Occlusion and stenosis of left carotid artery: Secondary | ICD-10-CM | POA: Diagnosis not present

## 2019-08-02 DIAGNOSIS — Z48812 Encounter for surgical aftercare following surgery on the circulatory system: Secondary | ICD-10-CM | POA: Diagnosis not present

## 2019-08-02 DIAGNOSIS — I69354 Hemiplegia and hemiparesis following cerebral infarction affecting left non-dominant side: Secondary | ICD-10-CM | POA: Diagnosis not present

## 2019-08-02 DIAGNOSIS — I4891 Unspecified atrial fibrillation: Secondary | ICD-10-CM | POA: Diagnosis not present

## 2019-08-02 DIAGNOSIS — J449 Chronic obstructive pulmonary disease, unspecified: Secondary | ICD-10-CM | POA: Diagnosis not present

## 2019-08-02 DIAGNOSIS — I69312 Visuospatial deficit and spatial neglect following cerebral infarction: Secondary | ICD-10-CM | POA: Diagnosis not present

## 2019-08-06 DIAGNOSIS — M19072 Primary osteoarthritis, left ankle and foot: Secondary | ICD-10-CM | POA: Diagnosis not present

## 2019-08-06 DIAGNOSIS — Z7901 Long term (current) use of anticoagulants: Secondary | ICD-10-CM | POA: Diagnosis not present

## 2019-08-06 DIAGNOSIS — I10 Essential (primary) hypertension: Secondary | ICD-10-CM | POA: Diagnosis not present

## 2019-08-06 DIAGNOSIS — Z853 Personal history of malignant neoplasm of breast: Secondary | ICD-10-CM | POA: Diagnosis not present

## 2019-08-06 DIAGNOSIS — I69354 Hemiplegia and hemiparesis following cerebral infarction affecting left non-dominant side: Secondary | ICD-10-CM | POA: Diagnosis not present

## 2019-08-06 DIAGNOSIS — G40209 Localization-related (focal) (partial) symptomatic epilepsy and epileptic syndromes with complex partial seizures, not intractable, without status epilepticus: Secondary | ICD-10-CM | POA: Diagnosis not present

## 2019-08-06 DIAGNOSIS — F419 Anxiety disorder, unspecified: Secondary | ICD-10-CM | POA: Diagnosis not present

## 2019-08-06 DIAGNOSIS — I4891 Unspecified atrial fibrillation: Secondary | ICD-10-CM | POA: Diagnosis not present

## 2019-08-06 DIAGNOSIS — F329 Major depressive disorder, single episode, unspecified: Secondary | ICD-10-CM | POA: Diagnosis not present

## 2019-08-06 DIAGNOSIS — R1310 Dysphagia, unspecified: Secondary | ICD-10-CM | POA: Diagnosis not present

## 2019-08-06 DIAGNOSIS — J449 Chronic obstructive pulmonary disease, unspecified: Secondary | ICD-10-CM | POA: Diagnosis not present

## 2019-08-06 DIAGNOSIS — I083 Combined rheumatic disorders of mitral, aortic and tricuspid valves: Secondary | ICD-10-CM | POA: Diagnosis not present

## 2019-08-06 DIAGNOSIS — I69312 Visuospatial deficit and spatial neglect following cerebral infarction: Secondary | ICD-10-CM | POA: Diagnosis not present

## 2019-08-06 DIAGNOSIS — J9611 Chronic respiratory failure with hypoxia: Secondary | ICD-10-CM | POA: Diagnosis not present

## 2019-08-06 DIAGNOSIS — Z9981 Dependence on supplemental oxygen: Secondary | ICD-10-CM | POA: Diagnosis not present

## 2019-08-06 DIAGNOSIS — K5904 Chronic idiopathic constipation: Secondary | ICD-10-CM | POA: Diagnosis not present

## 2019-08-06 DIAGNOSIS — Z87891 Personal history of nicotine dependence: Secondary | ICD-10-CM | POA: Diagnosis not present

## 2019-08-10 DIAGNOSIS — J449 Chronic obstructive pulmonary disease, unspecified: Secondary | ICD-10-CM | POA: Diagnosis not present

## 2019-08-10 DIAGNOSIS — I4891 Unspecified atrial fibrillation: Secondary | ICD-10-CM | POA: Diagnosis not present

## 2019-08-10 DIAGNOSIS — I69312 Visuospatial deficit and spatial neglect following cerebral infarction: Secondary | ICD-10-CM | POA: Diagnosis not present

## 2019-08-10 DIAGNOSIS — I69354 Hemiplegia and hemiparesis following cerebral infarction affecting left non-dominant side: Secondary | ICD-10-CM | POA: Diagnosis not present

## 2019-08-10 DIAGNOSIS — J9611 Chronic respiratory failure with hypoxia: Secondary | ICD-10-CM | POA: Diagnosis not present

## 2019-08-10 DIAGNOSIS — I10 Essential (primary) hypertension: Secondary | ICD-10-CM | POA: Diagnosis not present

## 2019-08-11 DIAGNOSIS — I4891 Unspecified atrial fibrillation: Secondary | ICD-10-CM | POA: Diagnosis not present

## 2019-08-11 DIAGNOSIS — I69354 Hemiplegia and hemiparesis following cerebral infarction affecting left non-dominant side: Secondary | ICD-10-CM | POA: Diagnosis not present

## 2019-08-11 DIAGNOSIS — I69312 Visuospatial deficit and spatial neglect following cerebral infarction: Secondary | ICD-10-CM | POA: Diagnosis not present

## 2019-08-11 DIAGNOSIS — I10 Essential (primary) hypertension: Secondary | ICD-10-CM | POA: Diagnosis not present

## 2019-08-11 DIAGNOSIS — J449 Chronic obstructive pulmonary disease, unspecified: Secondary | ICD-10-CM | POA: Diagnosis not present

## 2019-08-11 DIAGNOSIS — J9611 Chronic respiratory failure with hypoxia: Secondary | ICD-10-CM | POA: Diagnosis not present

## 2019-08-16 DIAGNOSIS — I10 Essential (primary) hypertension: Secondary | ICD-10-CM | POA: Diagnosis not present

## 2019-08-16 DIAGNOSIS — I69354 Hemiplegia and hemiparesis following cerebral infarction affecting left non-dominant side: Secondary | ICD-10-CM | POA: Diagnosis not present

## 2019-08-16 DIAGNOSIS — I4891 Unspecified atrial fibrillation: Secondary | ICD-10-CM | POA: Diagnosis not present

## 2019-08-16 DIAGNOSIS — I69312 Visuospatial deficit and spatial neglect following cerebral infarction: Secondary | ICD-10-CM | POA: Diagnosis not present

## 2019-08-16 DIAGNOSIS — J449 Chronic obstructive pulmonary disease, unspecified: Secondary | ICD-10-CM | POA: Diagnosis not present

## 2019-08-16 DIAGNOSIS — J9611 Chronic respiratory failure with hypoxia: Secondary | ICD-10-CM | POA: Diagnosis not present

## 2019-08-24 DIAGNOSIS — I10 Essential (primary) hypertension: Secondary | ICD-10-CM | POA: Diagnosis not present

## 2019-08-24 DIAGNOSIS — I4891 Unspecified atrial fibrillation: Secondary | ICD-10-CM | POA: Diagnosis not present

## 2019-08-24 DIAGNOSIS — J449 Chronic obstructive pulmonary disease, unspecified: Secondary | ICD-10-CM | POA: Diagnosis not present

## 2019-08-24 DIAGNOSIS — I69312 Visuospatial deficit and spatial neglect following cerebral infarction: Secondary | ICD-10-CM | POA: Diagnosis not present

## 2019-08-24 DIAGNOSIS — I69354 Hemiplegia and hemiparesis following cerebral infarction affecting left non-dominant side: Secondary | ICD-10-CM | POA: Diagnosis not present

## 2019-08-24 DIAGNOSIS — J9611 Chronic respiratory failure with hypoxia: Secondary | ICD-10-CM | POA: Diagnosis not present

## 2019-08-26 ENCOUNTER — Encounter: Payer: Self-pay | Admitting: Interventional Cardiology

## 2019-08-26 ENCOUNTER — Ambulatory Visit (INDEPENDENT_AMBULATORY_CARE_PROVIDER_SITE_OTHER): Payer: Medicare Other | Admitting: Interventional Cardiology

## 2019-08-26 ENCOUNTER — Other Ambulatory Visit: Payer: Self-pay

## 2019-08-26 VITALS — BP 119/74 | HR 73 | Ht 64.0 in | Wt 237.0 lb

## 2019-08-26 DIAGNOSIS — I4821 Permanent atrial fibrillation: Secondary | ICD-10-CM

## 2019-08-26 DIAGNOSIS — I6529 Occlusion and stenosis of unspecified carotid artery: Secondary | ICD-10-CM | POA: Diagnosis not present

## 2019-08-26 DIAGNOSIS — R600 Localized edema: Secondary | ICD-10-CM | POA: Diagnosis not present

## 2019-08-26 DIAGNOSIS — I10 Essential (primary) hypertension: Secondary | ICD-10-CM

## 2019-08-26 DIAGNOSIS — I63511 Cerebral infarction due to unspecified occlusion or stenosis of right middle cerebral artery: Secondary | ICD-10-CM

## 2019-08-26 MED ORDER — APIXABAN 5 MG PO TABS: 5.0000 mg | ORAL_TABLET | Freq: Two times a day (BID) | ORAL | 1 refills | Status: DC

## 2019-08-26 MED ORDER — DILTIAZEM HCL ER COATED BEADS 300 MG PO CP24: 300.0000 mg | ORAL_CAPSULE | Freq: Every day | ORAL | 3 refills | Status: DC

## 2019-08-26 NOTE — Progress Notes (Signed)
Cardiology Office Note   Date:  08/26/2019   ID:  Beth [name], Beth [name] [birth year], MRN [id number]  PCP:  Kathyrn Lass, MD    No chief complaint on file.  AFib  Wt Readings from Last 3 Encounters:  08/26/19 237 lb (107.5 kg)  07/20/19 242 lb (109.8 kg)  07/11/19 242 lb (109.8 kg)       History of Present Illness: Beth [name] is a 79 y.o. female  Who I saw in 2009 After stroke. 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 Subsequent seizures. Has some word finding issues and cognitive deficits with emotional lability She takes Keppra and Klonopin and is unable to drive, according to prior records.  In 2017, she was seen by Dr. Johnsie Cancel for palpitations.  Echo at that time: "Left ventricle: The cavity size was normal. Wall thickness was normal. Systolic function was vigorous. The estimated ejection fraction was in the range of 65% to 70%. Wall motion was normal; there were no regional wall motion abnormalities. Doppler parameters are consistent with abnormal left ventricular relaxation (grade 1 diastolic dysfunction). Doppler parameters are consistent with high ventricular filling pressure. - Mitral valve: Calcified annulus.  Impressions:  - Vigorous LV systolic function; grade 1 diastolic dysfunction with elevated LV filling pressure; trace MR and TR."   Since the last visit, she has had some SHOB.  Worse with exertion.  SHe feels weak.  She is on chronic oxygen for COPD.  She has an occasional fluttering in her heart.  She will eat a banana or drink water and sx improve.   End of September, there were several more intense spells of palpitations.  Two spells caused severe weakness and lightheadedness felt like she might pass out.  Weakness lasted for several days.    New onset AFib in the setting of TCAR in 6/21, for symptomatic right carotid stenosis. Managed by Dr. Scot Dock.  ECG from May 2021 shows A. fib with rapid  ventricular response.  Eliquis was added and restarted on postop day 2 after her TCAR.  Clopidogrel  Was stopped.   Past Medical History:  Diagnosis Date  . Afib ([place])    diagnosed 05/17/19  . Arthritis   . BRCA2 positive 2017  . COPD (chronic obstructive pulmonary disease) ([place])   . Diverticulosis   . Dysrhythmia    Afib  . Hearing loss   . HTN (hypertension)   . Memory loss   . Pre-diabetes   . Seasonal depression ([place])   . 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 Seizures [facility])    anniversary seizure - 1 year after serizure  . Stroke ([place])   . 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   . Vestibulitis of ear     Past Surgical History:  Procedure Laterality Date  . broken ankle Right   . TONSILLECTOMY    . TRANSCAROTID ARTERY REVASCULARIZATION Right 06/20/2019  . TRANSCAROTID ARTERY REVASCULARIZATION Right 06/20/2019   Procedure: RIGHT TRANSCAROTID ARTERY REVASCULARIZATION;  Surgeon: Angelia Mould, MD;  Location: [facility];  Service: Vascular;  Laterality: Right;     Current Outpatient Medications  Medication Sig Dispense Refill  . acetaminophen (TYLENOL) 325 MG tablet Take 1-2 tablets (325-650 mg total) by mouth every 4 (four) hours as needed for mild pain.    Marland Kitchen alum & mag hydroxide-simeth (MAALOX/MYLANTA) 200-200-20 MG/5ML suspension Take 15-30 mLs by mouth every 2 (two) hours as needed for  indigestion. 355 mL 0  . apixaban (ELIQUIS) 5 MG TABS tablet Take 1 tablet (5 mg total) by mouth 2 (two) times daily. 60 tablet 0  . aspirin EC 81 MG tablet Take 81 mg by mouth daily.    Marland Kitchen atorvastatin (LIPITOR) 20 MG tablet Take 1 tablet (20 mg total) by mouth daily. 90 tablet 3  . captopril (CAPOTEN) 50 MG tablet Take 50 mg by mouth 2 (two) times daily.    . Cholecalciferol (VITAMIN D3) 2000 units capsule Take 2,000 Units by mouth daily.     . clonazePAM (KLONOPIN) 2 MG tablet TAKE 1 TABLET AT BEDTIME (Patient taking  differently: Take 2 mg by mouth at bedtime. For seizure activity or insomnia dohmeier.) 90 tablet 0  . diltiazem (CARDIZEM CD) 300 MG 24 hr capsule Take 1 capsule (300 mg total) by mouth daily. 30 capsule 0  . famotidine (PEPCID) 20 MG tablet Take 1 tablet (20 mg total) by mouth 2 (two) times daily. 60 tablet 0  . fluticasone (FLONASE) 50 MCG/ACT nasal spray Place 1 spray into the nose daily as needed for allergies. Spray twice daily as needed     . gabapentin (NEURONTIN) 600 MG tablet Take 0.5 tablets (300 mg total) by mouth at bedtime. 30 tablet 0  . hydrocerin (EUCERIN) CREA Apply 1 application topically 2 (two) times daily.  0  . levETIRAcetam (KEPPRA) 750 MG tablet Take 750 mg by mouth 2 (two) times daily.    Marland Kitchen levETIRAcetam 750 MG TB3D Take 750 mg by mouth 2 (two) times daily. 180 tablet 0  . LEXAPRO 20 MG tablet Take 1 tablet (20 mg total) by mouth daily. Morning dosage 90 tablet 3  . loratadine (CLARITIN) 10 MG tablet Take 1 tablet (10 mg total) by mouth daily.    . Menthol-Methyl Salicylate (MUSCLE RUB) 10-15 % CREA Apply 1 application topically as needed for muscle pain. To neck and shoulder  0  . polyethylene glycol (MIRALAX / GLYCOLAX) 17 g packet Take 17 g by mouth daily. Purchase bottle over the counter and use capful daily 14 each 0  . PROAIR HFA 108 (90 Base) MCG/ACT inhaler USE 2 INHALATIONS EVERY 4 HOURS AS NEEDED FOR WHEEZING OR SHORTNESS OF BREATH 8.5 g 20  . sodium chloride (OCEAN) 0.65 % SOLN nasal spray Place 1 spray into both nostrils in the morning, at noon, in the evening, and at bedtime.  0  . STIOLTO RESPIMAT 2.5-2.5 MCG/ACT AERS INHALE 2 PUFFS INTO THE LUNGS DAILY 4 g 3  . vitamin B-12 (CYANOCOBALAMIN) 500 MCG tablet Take 1 tablet (500 mcg total) by mouth daily.     No current facility-administered medications for this visit.    Allergies:   Prozac [fluoxetine hcl], Sulfa antibiotics, Micardis [telmisartan], Prednisone, Lisinopril-hydrochlorothiazide, Augmentin  [amoxicillin-pot clavulanate], Avelox [moxifloxacin hcl in nacl], and Norvasc [amlodipine besylate]    Social History:  The patient  reports that she quit smoking about 13 years ago. She has a 41.00 pack-year smoking history. She has never used smokeless tobacco. She reports that she does not drink alcohol and does not use drugs.   Family History:  The patient's family history includes Bleeding Disorder in her maternal grandfather; Breast cancer (age of onset: 42) in her daughter; Colon cancer in her maternal aunt; Dementia in her maternal grandfather and mother; Emphysema in her maternal aunt, maternal aunt, and maternal uncle; High blood pressure in her maternal grandmother; Osteoporosis in her mother; Other in her daughter and daughter; Ovarian cancer (age  of onset: 40) in her paternal grandmother; Ovarian cancer (age of onset: 58) in her paternal aunt; Stroke in her mother; Thyroid disease in her mother.    ROS:  Please see the history of present illness.   Otherwise, review of systems are positive for DOE-chronic.   All other systems are reviewed and negative.    PHYSICAL EXAM: VS:  BP 119/74   Pulse 73   Ht 5' 4" (1.626 m)   Wt 237 lb (107.5 kg)   LMP 01/14/1995 (Approximate)   SpO2 92%   BMI 40.68 kg/m  , BMI Body mass index is 40.68 kg/m. GEN: Well nourished, well developed, in no acute distress  HEENT: normal  Neck: no JVD, carotid bruits, or masses Cardiac: irregularly irregular, normal rate; no murmurs, rubs, or gallops,; mild left leg edema  Respiratory:  clear to auscultation bilaterally, normal work of breathing GI: soft, nontender, nondistended, + BS, obese MS: no deformity or atrophy  Skin: warm and dry, no rash Neuro:  Strength and sensation are intact; tremulous voce Psych: euthymic mood, full affect   EKG:   5/21: AFib with RVR   Recent Labs: 05/16/2019: TSH 1.737 06/20/2019: ALT 16; BUN 11; Creatinine, Ser 0.72; Potassium 3.8; Sodium 138 06/22/2019: Hemoglobin  10.2; Platelets 224   Lipid Panel    Component Value Date/Time   CHOL 151 05/17/2019 0346   TRIG 172 (H) 05/17/2019 0346   HDL 39 (L) 05/17/2019 0346   CHOLHDL 3.9 05/17/2019 0346   VLDL 34 05/17/2019 0346   LDLCALC 78 05/17/2019 0346     Other studies Reviewed: Additional studies/ records that were reviewed today with results demonstrating: Hospital records reviewed.   ASSESSMENT AND PLAN:  1. Atrial fibrillation: Elevated chads Vasc given her age, prior CVA and gender.  On Eliquis.  RVR wen she is in AFib.  Refill diltiazem.  Plavix was stopped.  Aspirin continued, which may be reasonable given recent TCAR.  Rate controlled on diltiazem 300 mg daily.  Will defer to Dr. Scot Dock when aspirin can be stopped.  2. Hypertension: The current medical regimen is effective;  continue present plan and medications. 3. Lower extremity edema: Minimal in the right.  Chronic left leg edema after her stroke.  4. PAD: LDL target less than 100.   5. Needs Gabapentin refill.  Dr. Beacher May prescibes this.    Current medicines are reviewed at length with the patient today.  The patient concerns regarding her medicines were addressed.  The following changes have been made:  No change  Labs/ tests ordered today include:  No orders of the defined types were placed in this encounter.   Recommend 150 minutes/week of aerobic exercise Low fat, low carb, high fiber diet recommended  Disposition:   FU in 6 months for check on rate control.   Signed, Larae Grooms, MD  08/26/2019 4:19 PM    [facility] [place], [place],   [address] Phone: [phone]; Fax: [phone]

## 2019-08-26 NOTE — Patient Instructions (Signed)

## 2019-08-30 DIAGNOSIS — I4891 Unspecified atrial fibrillation: Secondary | ICD-10-CM | POA: Diagnosis not present

## 2019-08-30 DIAGNOSIS — J9611 Chronic respiratory failure with hypoxia: Secondary | ICD-10-CM | POA: Diagnosis not present

## 2019-08-30 DIAGNOSIS — I69312 Visuospatial deficit and spatial neglect following cerebral infarction: Secondary | ICD-10-CM | POA: Diagnosis not present

## 2019-08-30 DIAGNOSIS — I10 Essential (primary) hypertension: Secondary | ICD-10-CM | POA: Diagnosis not present

## 2019-08-30 DIAGNOSIS — I69354 Hemiplegia and hemiparesis following cerebral infarction affecting left non-dominant side: Secondary | ICD-10-CM | POA: Diagnosis not present

## 2019-08-30 DIAGNOSIS — J449 Chronic obstructive pulmonary disease, unspecified: Secondary | ICD-10-CM | POA: Diagnosis not present

## 2019-09-02 DIAGNOSIS — J9611 Chronic respiratory failure with hypoxia: Secondary | ICD-10-CM | POA: Diagnosis not present

## 2019-09-02 DIAGNOSIS — J449 Chronic obstructive pulmonary disease, unspecified: Secondary | ICD-10-CM | POA: Diagnosis not present

## 2019-09-02 DIAGNOSIS — I69312 Visuospatial deficit and spatial neglect following cerebral infarction: Secondary | ICD-10-CM | POA: Diagnosis not present

## 2019-09-02 DIAGNOSIS — I4891 Unspecified atrial fibrillation: Secondary | ICD-10-CM | POA: Diagnosis not present

## 2019-09-02 DIAGNOSIS — I10 Essential (primary) hypertension: Secondary | ICD-10-CM | POA: Diagnosis not present

## 2019-09-02 DIAGNOSIS — I69354 Hemiplegia and hemiparesis following cerebral infarction affecting left non-dominant side: Secondary | ICD-10-CM | POA: Diagnosis not present

## 2019-09-04 ENCOUNTER — Other Ambulatory Visit: Payer: Self-pay | Admitting: Diagnostic Neuroimaging

## 2019-09-04 DIAGNOSIS — I609 Nontraumatic subarachnoid hemorrhage, unspecified: Secondary | ICD-10-CM

## 2019-09-04 DIAGNOSIS — G40409 Other generalized epilepsy and epileptic syndromes, not intractable, without status epilepticus: Secondary | ICD-10-CM

## 2019-09-04 DIAGNOSIS — F09 Unspecified mental disorder due to known physiological condition: Secondary | ICD-10-CM

## 2019-09-05 DIAGNOSIS — G40209 Localization-related (focal) (partial) symptomatic epilepsy and epileptic syndromes with complex partial seizures, not intractable, without status epilepticus: Secondary | ICD-10-CM | POA: Diagnosis not present

## 2019-09-05 DIAGNOSIS — I4891 Unspecified atrial fibrillation: Secondary | ICD-10-CM | POA: Diagnosis not present

## 2019-09-05 DIAGNOSIS — I10 Essential (primary) hypertension: Secondary | ICD-10-CM | POA: Diagnosis not present

## 2019-09-05 DIAGNOSIS — F419 Anxiety disorder, unspecified: Secondary | ICD-10-CM | POA: Diagnosis not present

## 2019-09-05 DIAGNOSIS — Z9981 Dependence on supplemental oxygen: Secondary | ICD-10-CM | POA: Diagnosis not present

## 2019-09-05 DIAGNOSIS — I083 Combined rheumatic disorders of mitral, aortic and tricuspid valves: Secondary | ICD-10-CM | POA: Diagnosis not present

## 2019-09-05 DIAGNOSIS — J9611 Chronic respiratory failure with hypoxia: Secondary | ICD-10-CM | POA: Diagnosis not present

## 2019-09-05 DIAGNOSIS — K5904 Chronic idiopathic constipation: Secondary | ICD-10-CM | POA: Diagnosis not present

## 2019-09-05 DIAGNOSIS — Z7901 Long term (current) use of anticoagulants: Secondary | ICD-10-CM | POA: Diagnosis not present

## 2019-09-05 DIAGNOSIS — J449 Chronic obstructive pulmonary disease, unspecified: Secondary | ICD-10-CM | POA: Diagnosis not present

## 2019-09-05 DIAGNOSIS — I69354 Hemiplegia and hemiparesis following cerebral infarction affecting left non-dominant side: Secondary | ICD-10-CM | POA: Diagnosis not present

## 2019-09-05 DIAGNOSIS — I69312 Visuospatial deficit and spatial neglect following cerebral infarction: Secondary | ICD-10-CM | POA: Diagnosis not present

## 2019-09-05 DIAGNOSIS — R1310 Dysphagia, unspecified: Secondary | ICD-10-CM | POA: Diagnosis not present

## 2019-09-05 DIAGNOSIS — M19072 Primary osteoarthritis, left ankle and foot: Secondary | ICD-10-CM | POA: Diagnosis not present

## 2019-09-05 DIAGNOSIS — F329 Major depressive disorder, single episode, unspecified: Secondary | ICD-10-CM | POA: Diagnosis not present

## 2019-09-05 DIAGNOSIS — Z853 Personal history of malignant neoplasm of breast: Secondary | ICD-10-CM | POA: Diagnosis not present

## 2019-09-05 DIAGNOSIS — Z87891 Personal history of nicotine dependence: Secondary | ICD-10-CM | POA: Diagnosis not present

## 2019-09-06 DIAGNOSIS — I69354 Hemiplegia and hemiparesis following cerebral infarction affecting left non-dominant side: Secondary | ICD-10-CM | POA: Diagnosis not present

## 2019-09-06 DIAGNOSIS — I4891 Unspecified atrial fibrillation: Secondary | ICD-10-CM | POA: Diagnosis not present

## 2019-09-06 DIAGNOSIS — I10 Essential (primary) hypertension: Secondary | ICD-10-CM | POA: Diagnosis not present

## 2019-09-06 DIAGNOSIS — I69312 Visuospatial deficit and spatial neglect following cerebral infarction: Secondary | ICD-10-CM | POA: Diagnosis not present

## 2019-09-06 DIAGNOSIS — J9611 Chronic respiratory failure with hypoxia: Secondary | ICD-10-CM | POA: Diagnosis not present

## 2019-09-06 DIAGNOSIS — J449 Chronic obstructive pulmonary disease, unspecified: Secondary | ICD-10-CM | POA: Diagnosis not present

## 2019-09-07 DIAGNOSIS — I69312 Visuospatial deficit and spatial neglect following cerebral infarction: Secondary | ICD-10-CM | POA: Diagnosis not present

## 2019-09-07 DIAGNOSIS — I69354 Hemiplegia and hemiparesis following cerebral infarction affecting left non-dominant side: Secondary | ICD-10-CM | POA: Diagnosis not present

## 2019-09-07 DIAGNOSIS — I4891 Unspecified atrial fibrillation: Secondary | ICD-10-CM | POA: Diagnosis not present

## 2019-09-07 DIAGNOSIS — J449 Chronic obstructive pulmonary disease, unspecified: Secondary | ICD-10-CM | POA: Diagnosis not present

## 2019-09-07 DIAGNOSIS — J9611 Chronic respiratory failure with hypoxia: Secondary | ICD-10-CM | POA: Diagnosis not present

## 2019-09-07 DIAGNOSIS — I10 Essential (primary) hypertension: Secondary | ICD-10-CM | POA: Diagnosis not present

## 2019-09-08 DIAGNOSIS — J449 Chronic obstructive pulmonary disease, unspecified: Secondary | ICD-10-CM | POA: Diagnosis not present

## 2019-09-08 DIAGNOSIS — I69312 Visuospatial deficit and spatial neglect following cerebral infarction: Secondary | ICD-10-CM | POA: Diagnosis not present

## 2019-09-08 DIAGNOSIS — I4891 Unspecified atrial fibrillation: Secondary | ICD-10-CM | POA: Diagnosis not present

## 2019-09-08 DIAGNOSIS — J9611 Chronic respiratory failure with hypoxia: Secondary | ICD-10-CM | POA: Diagnosis not present

## 2019-09-08 DIAGNOSIS — I10 Essential (primary) hypertension: Secondary | ICD-10-CM | POA: Diagnosis not present

## 2019-09-08 DIAGNOSIS — I69354 Hemiplegia and hemiparesis following cerebral infarction affecting left non-dominant side: Secondary | ICD-10-CM | POA: Diagnosis not present

## 2019-09-13 DIAGNOSIS — J449 Chronic obstructive pulmonary disease, unspecified: Secondary | ICD-10-CM | POA: Diagnosis not present

## 2019-09-13 DIAGNOSIS — J9611 Chronic respiratory failure with hypoxia: Secondary | ICD-10-CM | POA: Diagnosis not present

## 2019-09-13 DIAGNOSIS — I4891 Unspecified atrial fibrillation: Secondary | ICD-10-CM | POA: Diagnosis not present

## 2019-09-13 DIAGNOSIS — I69312 Visuospatial deficit and spatial neglect following cerebral infarction: Secondary | ICD-10-CM | POA: Diagnosis not present

## 2019-09-13 DIAGNOSIS — I69354 Hemiplegia and hemiparesis following cerebral infarction affecting left non-dominant side: Secondary | ICD-10-CM | POA: Diagnosis not present

## 2019-09-13 DIAGNOSIS — I10 Essential (primary) hypertension: Secondary | ICD-10-CM | POA: Diagnosis not present

## 2019-09-14 DIAGNOSIS — J9611 Chronic respiratory failure with hypoxia: Secondary | ICD-10-CM | POA: Diagnosis not present

## 2019-09-14 DIAGNOSIS — J449 Chronic obstructive pulmonary disease, unspecified: Secondary | ICD-10-CM | POA: Diagnosis not present

## 2019-09-14 DIAGNOSIS — I69312 Visuospatial deficit and spatial neglect following cerebral infarction: Secondary | ICD-10-CM | POA: Diagnosis not present

## 2019-09-14 DIAGNOSIS — I10 Essential (primary) hypertension: Secondary | ICD-10-CM | POA: Diagnosis not present

## 2019-09-14 DIAGNOSIS — I69354 Hemiplegia and hemiparesis following cerebral infarction affecting left non-dominant side: Secondary | ICD-10-CM | POA: Diagnosis not present

## 2019-09-14 DIAGNOSIS — I4891 Unspecified atrial fibrillation: Secondary | ICD-10-CM | POA: Diagnosis not present

## 2019-09-15 DIAGNOSIS — J9611 Chronic respiratory failure with hypoxia: Secondary | ICD-10-CM | POA: Diagnosis not present

## 2019-09-15 DIAGNOSIS — I69354 Hemiplegia and hemiparesis following cerebral infarction affecting left non-dominant side: Secondary | ICD-10-CM | POA: Diagnosis not present

## 2019-09-15 DIAGNOSIS — I4891 Unspecified atrial fibrillation: Secondary | ICD-10-CM | POA: Diagnosis not present

## 2019-09-15 DIAGNOSIS — I10 Essential (primary) hypertension: Secondary | ICD-10-CM | POA: Diagnosis not present

## 2019-09-15 DIAGNOSIS — J449 Chronic obstructive pulmonary disease, unspecified: Secondary | ICD-10-CM | POA: Diagnosis not present

## 2019-09-15 DIAGNOSIS — I69312 Visuospatial deficit and spatial neglect following cerebral infarction: Secondary | ICD-10-CM | POA: Diagnosis not present

## 2019-09-20 DIAGNOSIS — I10 Essential (primary) hypertension: Secondary | ICD-10-CM | POA: Diagnosis not present

## 2019-09-20 DIAGNOSIS — I69354 Hemiplegia and hemiparesis following cerebral infarction affecting left non-dominant side: Secondary | ICD-10-CM | POA: Diagnosis not present

## 2019-09-20 DIAGNOSIS — I69312 Visuospatial deficit and spatial neglect following cerebral infarction: Secondary | ICD-10-CM | POA: Diagnosis not present

## 2019-09-20 DIAGNOSIS — J449 Chronic obstructive pulmonary disease, unspecified: Secondary | ICD-10-CM | POA: Diagnosis not present

## 2019-09-20 DIAGNOSIS — I4891 Unspecified atrial fibrillation: Secondary | ICD-10-CM | POA: Diagnosis not present

## 2019-09-20 DIAGNOSIS — J9611 Chronic respiratory failure with hypoxia: Secondary | ICD-10-CM | POA: Diagnosis not present

## 2019-09-23 ENCOUNTER — Telehealth: Payer: Self-pay

## 2019-09-23 DIAGNOSIS — J9611 Chronic respiratory failure with hypoxia: Secondary | ICD-10-CM | POA: Diagnosis not present

## 2019-09-23 DIAGNOSIS — I4891 Unspecified atrial fibrillation: Secondary | ICD-10-CM | POA: Diagnosis not present

## 2019-09-23 DIAGNOSIS — I69312 Visuospatial deficit and spatial neglect following cerebral infarction: Secondary | ICD-10-CM | POA: Diagnosis not present

## 2019-09-23 DIAGNOSIS — J449 Chronic obstructive pulmonary disease, unspecified: Secondary | ICD-10-CM | POA: Diagnosis not present

## 2019-09-23 DIAGNOSIS — I10 Essential (primary) hypertension: Secondary | ICD-10-CM | POA: Diagnosis not present

## 2019-09-23 DIAGNOSIS — I69354 Hemiplegia and hemiparesis following cerebral infarction affecting left non-dominant side: Secondary | ICD-10-CM | POA: Diagnosis not present

## 2019-09-23 NOTE — Telephone Encounter (Signed)
Pt called with questions regarding applying lotion to her TCAR incision done in June. She feels it is well healed with a slight purple/blue hue and feels a little lumpy. Offered her an appt to be seen next week for a wound check and she prefers the week after. She verbalized understanding of this appt date/time. No further questions/concerns at this time.

## 2019-09-26 DIAGNOSIS — I69354 Hemiplegia and hemiparesis following cerebral infarction affecting left non-dominant side: Secondary | ICD-10-CM | POA: Diagnosis not present

## 2019-09-26 DIAGNOSIS — I69312 Visuospatial deficit and spatial neglect following cerebral infarction: Secondary | ICD-10-CM | POA: Diagnosis not present

## 2019-09-26 DIAGNOSIS — I4891 Unspecified atrial fibrillation: Secondary | ICD-10-CM | POA: Diagnosis not present

## 2019-09-26 DIAGNOSIS — I10 Essential (primary) hypertension: Secondary | ICD-10-CM | POA: Diagnosis not present

## 2019-09-26 DIAGNOSIS — J9611 Chronic respiratory failure with hypoxia: Secondary | ICD-10-CM | POA: Diagnosis not present

## 2019-09-26 DIAGNOSIS — J449 Chronic obstructive pulmonary disease, unspecified: Secondary | ICD-10-CM | POA: Diagnosis not present

## 2019-09-28 DIAGNOSIS — I4891 Unspecified atrial fibrillation: Secondary | ICD-10-CM | POA: Diagnosis not present

## 2019-09-28 DIAGNOSIS — I69354 Hemiplegia and hemiparesis following cerebral infarction affecting left non-dominant side: Secondary | ICD-10-CM | POA: Diagnosis not present

## 2019-09-28 DIAGNOSIS — I69312 Visuospatial deficit and spatial neglect following cerebral infarction: Secondary | ICD-10-CM | POA: Diagnosis not present

## 2019-09-28 DIAGNOSIS — J449 Chronic obstructive pulmonary disease, unspecified: Secondary | ICD-10-CM | POA: Diagnosis not present

## 2019-09-28 DIAGNOSIS — J9611 Chronic respiratory failure with hypoxia: Secondary | ICD-10-CM | POA: Diagnosis not present

## 2019-09-28 DIAGNOSIS — I10 Essential (primary) hypertension: Secondary | ICD-10-CM | POA: Diagnosis not present

## 2019-09-30 ENCOUNTER — Telehealth: Payer: Self-pay | Admitting: Primary Care

## 2019-09-30 NOTE — Telephone Encounter (Signed)
Noted.  Will close encounter.  

## 2019-09-30 NOTE — Telephone Encounter (Signed)
Pt calling back in stating that after thinking about it, she needs to see someone before she changes medications. States that she has been in the hospital and medications have changed etc. States there is no need to call back - she will wait and see Derl Barrow on 11/01/19 and discuss with her then.

## 2019-09-30 NOTE — Telephone Encounter (Signed)
spoke with patient regarding prior message. Patient stated she been using her Pro Air at night for SOB . Advised patient her Pro Air is every 4 hours prn .Pateint stated she was wondering if she can you her Stiolto respimat inhaler right before bed?Patient is on o2 3L. Advised patient I would need to send the provider a message .  Dr.Alva can you please advise

## 2019-10-05 ENCOUNTER — Other Ambulatory Visit: Payer: Self-pay

## 2019-10-05 ENCOUNTER — Ambulatory Visit (INDEPENDENT_AMBULATORY_CARE_PROVIDER_SITE_OTHER): Payer: Medicare Other | Admitting: Physician Assistant

## 2019-10-05 VITALS — BP 128/74 | HR 117 | Temp 97.4°F | Resp 20 | Ht 64.0 in | Wt 244.1 lb

## 2019-10-05 DIAGNOSIS — Z48812 Encounter for surgical aftercare following surgery on the circulatory system: Secondary | ICD-10-CM

## 2019-10-05 DIAGNOSIS — I6521 Occlusion and stenosis of right carotid artery: Secondary | ICD-10-CM

## 2019-10-05 DIAGNOSIS — I63511 Cerebral infarction due to unspecified occlusion or stenosis of right middle cerebral artery: Secondary | ICD-10-CM

## 2019-10-05 NOTE — Progress Notes (Signed)
History of Present Illness:  Beth [name] is a 79 y.o. year old female who presents for evaluation of her incision after right TCAR 06/20/19.  She has noticed pink skin discoloration and a raised area of scar at the distal portion of her incision.  She denise pain, swallowing difficulties, or new weakness.  She  was admitted on 05/16/19 with sudden onset of left sided weakness, sensory loss and neglect.  Her weakness is improving and she is ambulatory.  She still complains of left hand decreased sensation.    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    Vestibulitis of ear     Past Surgical History:  Procedure Laterality Date   broken ankle Right    TONSILLECTOMY     TRANSCAROTID ARTERY REVASCULARIZATION Right 06/20/2019   TRANSCAROTID ARTERY REVASCULARIZATION Right 06/20/2019   Procedure: RIGHT TRANSCAROTID ARTERY REVASCULARIZATION;  Surgeon: Angelia Mould, MD;  Location: [facility];  Service: Vascular;  Laterality: Right;     Social History Social History   Tobacco Use   Smoking status: Former Smoker    Packs/day: 1.00    Years: 41.00    Pack years: 41.00    Quit date: 01/13/2006    Years since quitting: 13.7   Smokeless tobacco: Never Used  Scientific laboratory technician Use: Never used  Substance Use Topics   Alcohol use: No    Alcohol/week: 0.0 standard drinks   Drug use: No    Family History Family History  Problem Relation Age of Onset   Dementia Mother     Stroke Mother    Thyroid disease Mother    Osteoporosis Mother    Ovarian cancer Paternal Grandmother 32   Ovarian cancer Paternal Aunt 69   Breast cancer Daughter 27       breast ca x2, bilateral dx. 34, 46--estrogen   Other Daughter        no genetic testing as of 07/2015; has had ovaries removed   Other Daughter        BRCA 2 Pos.   Colon cancer Maternal Aunt        dx. unspecified age   Emphysema Maternal Uncle    High blood pressure Maternal Grandmother    Dementia Maternal Grandfather    Bleeding Disorder Maternal Grandfather    Emphysema Maternal Aunt    Emphysema Maternal Aunt     Allergies  Allergies  Allergen Reactions   Prozac [Fluoxetine Hcl] Other (See Comments)    seizures   Sulfa Antibiotics Nausea And Vomiting and Other (See Comments)    TINGLING IN LEGS ALSO   Micardis [Telmisartan] Swelling and Other (See Comments)    Swelling, bruising Swelling, bruising   Prednisone Other (See Comments)    mood changes Mood changes   Lisinopril-Hydrochlorothiazide Other (See Comments)    unknown   Augmentin [Amoxicillin-Pot Clavulanate] Rash   Avelox [  Moxifloxacin Hcl In Nacl] Rash    LEVAQUIN IS OK-SEE NOTE   Norvasc [Amlodipine Besylate] Other (See Comments)    fatigue     Current Outpatient Medications  Medication Sig Dispense Refill   acetaminophen (TYLENOL) 325 MG tablet Take 1-2 tablets (325-650 mg total) by mouth every 4 (four) hours as needed for mild pain.     alum & mag hydroxide-simeth (MAALOX/MYLANTA) 200-200-20 MG/5ML suspension Take 15-30 mLs by mouth every 2 (two) hours as needed for indigestion. 355 mL 0   apixaban (ELIQUIS) 5 MG TABS tablet Take 1 tablet (5 mg total) by mouth 2 (two) times daily. 180 tablet 1   aspirin EC 81 MG tablet Take 81 mg by mouth daily.     atorvastatin (LIPITOR) 20 MG tablet Take 1 tablet (20 mg total) by mouth daily. 90 tablet 3   captopril (CAPOTEN) 50 MG tablet Take 50 mg by mouth 2 (two)  times daily.     Cholecalciferol (VITAMIN D3) 2000 units capsule Take 2,000 Units by mouth daily.      clonazePAM (KLONOPIN) 2 MG tablet Take 1 tablet (2 mg total) by mouth at bedtime. For seizure activity or insomnia dohmeier. 90 tablet 1   diltiazem (CARDIZEM CD) 300 MG 24 hr capsule Take 1 capsule (300 mg total) by mouth daily. 90 capsule 3   famotidine (PEPCID) 20 MG tablet Take 1 tablet (20 mg total) by mouth 2 (two) times daily. 60 tablet 0   fluticasone (FLONASE) 50 MCG/ACT nasal spray Place 1 spray into the nose daily as needed for allergies. Spray twice daily as needed      gabapentin (NEURONTIN) 600 MG tablet Take 0.5 tablets (300 mg total) by mouth at bedtime. 30 tablet 0   hydrocerin (EUCERIN) CREA Apply 1 application topically 2 (two) times daily.  0   levETIRAcetam (KEPPRA) 750 MG tablet Take 750 mg by mouth 2 (two) times daily.     levETIRAcetam 750 MG TB3D Take 750 mg by mouth 2 (two) times daily. 180 tablet 0   LEXAPRO 20 MG tablet Take 1 tablet (20 mg total) by mouth daily. Morning dosage 90 tablet 3   loratadine (CLARITIN) 10 MG tablet Take 1 tablet (10 mg total) by mouth daily.     Menthol-Methyl Salicylate (MUSCLE RUB) 10-15 % CREA Apply 1 application topically as needed for muscle pain. To neck and shoulder  0   polyethylene glycol (MIRALAX / GLYCOLAX) 17 g packet Take 17 g by mouth daily. Purchase bottle over the counter and use capful daily 14 each 0   PROAIR HFA 108 (90 Base) MCG/ACT inhaler USE 2 INHALATIONS EVERY 4 HOURS AS NEEDED FOR WHEEZING OR SHORTNESS OF BREATH 8.5 g 20   sodium chloride (OCEAN) 0.65 % SOLN nasal spray Place 1 spray into both nostrils in the morning, at noon, in the evening, and at bedtime.  0   STIOLTO RESPIMAT 2.5-2.5 MCG/ACT AERS INHALE 2 PUFFS INTO THE LUNGS DAILY 4 g 3   vitamin B-12 (CYANOCOBALAMIN) 500 MCG tablet Take 1 tablet (500 mcg total) by mouth daily.     No current facility-administered medications for this visit.     ROS:   General:  No weight loss, Fever, chills  HEENT: No recent headaches, no nasal bleeding, no visual changes, no sore throat  Neurologic: No dizziness, blackouts, seizures. 05/2019 recent symptoms of stroke or mini- stroke. No recent episodes of slurred speech, or temporary blindness.  Cardiac: No recent episodes of chest pain/pressure, no shortness  of breath at rest.  No shortness of breath with exertion.  Denies history of atrial fibrillation or irregular heartbeat  Vascular: No history of rest pain in feet.  No history of claudication.  No history of non-healing ulcer, No history of DVT   Pulmonary: positive home oxygen, no productive cough, no hemoptysis,  No asthma or wheezing  Musculoskeletal:  _0  Arthritis, _1  Low back pain,  _2  Joint pain  Hematologic:No history of hypercoagulable state.  No history of easy bleeding.  No history of anemia  Gastrointestinal: No hematochezia or melena,  No gastroesophageal reflux, no trouble swallowing  Urinary: _3  chronic Kidney disease, _4  on HD - _5  MWF or _6  TTHS, _7  Burning with urination, _8  Frequent urination, _9  Difficulty urinating;   Skin: No rashes  Psychological: No history of anxiety,  No history of depression   Physical Examination  Vitals:   10/05/19 1406 10/05/19 1409  BP: 134/77 128/74  Pulse: (!) 117   Resp: 20   Temp: (!) 97.4 F (36.3 C)   TempSrc: Temporal   SpO2: 91%   Weight: 244 lb 1.6 oz (110.7 kg)   Height: _10  (1.626 m)     Body mass index is 41.9 kg/m.  General:  Alert and oriented, no acute distress HEENT: Normal Neck: No bruit or JVD Pulmonary: Clear to auscultation bilaterally Cardiac: Regular Rate and Rhythm without murmur Gastrointestinal: Soft, non-tender, non-distended, no mass, no scars Skin: No rash Extremity Pulses:  2+ radial Musculoskeletal: No deformity or edema  Neurologic: Upper and lower extremity motor 5/5 and symmetric, decreased sensation in the left  hand  ASSESSMENT:   Stable recovery s/p right TCAR for symptomatic carotid stenosis   PLAN:  Her scar is healing well with good skin mobility.  I encouraged her to perform manual mobility of the scar with lotion of choice or Mederma type cream.  I reassured her it looks perfectly healthy.     She will f/u in 6 months for repeat carotid duplex and exam.     Roxy Horseman PA-C [facility] Office: [phone]  MD in clinic [place]

## 2019-10-07 DIAGNOSIS — H52223 Regular astigmatism, bilateral: Secondary | ICD-10-CM | POA: Diagnosis not present

## 2019-10-07 DIAGNOSIS — H1131 Conjunctival hemorrhage, right eye: Secondary | ICD-10-CM | POA: Diagnosis not present

## 2019-10-07 DIAGNOSIS — H5211 Myopia, right eye: Secondary | ICD-10-CM | POA: Diagnosis not present

## 2019-10-07 DIAGNOSIS — H524 Presbyopia: Secondary | ICD-10-CM | POA: Diagnosis not present

## 2019-10-07 DIAGNOSIS — H5202 Hypermetropia, left eye: Secondary | ICD-10-CM | POA: Diagnosis not present

## 2019-10-17 DIAGNOSIS — I6930 Unspecified sequelae of cerebral infarction: Secondary | ICD-10-CM | POA: Diagnosis not present

## 2019-10-17 DIAGNOSIS — Z6841 Body Mass Index (BMI) 40.0 and over, adult: Secondary | ICD-10-CM | POA: Diagnosis not present

## 2019-10-17 DIAGNOSIS — J9611 Chronic respiratory failure with hypoxia: Secondary | ICD-10-CM | POA: Diagnosis not present

## 2019-10-17 DIAGNOSIS — F3341 Major depressive disorder, recurrent, in partial remission: Secondary | ICD-10-CM | POA: Diagnosis not present

## 2019-10-17 DIAGNOSIS — R7301 Impaired fasting glucose: Secondary | ICD-10-CM | POA: Diagnosis not present

## 2019-10-17 DIAGNOSIS — Z1159 Encounter for screening for other viral diseases: Secondary | ICD-10-CM | POA: Diagnosis not present

## 2019-10-17 DIAGNOSIS — I10 Essential (primary) hypertension: Secondary | ICD-10-CM | POA: Diagnosis not present

## 2019-10-17 DIAGNOSIS — Z23 Encounter for immunization: Secondary | ICD-10-CM | POA: Diagnosis not present

## 2019-10-25 ENCOUNTER — Telehealth: Payer: Self-pay

## 2019-10-25 ENCOUNTER — Other Ambulatory Visit: Payer: Self-pay

## 2019-10-25 ENCOUNTER — Inpatient Hospital Stay: Payer: Medicare Other | Attending: Oncology | Admitting: Oncology

## 2019-10-25 VITALS — BP 160/97 | HR 81 | Temp 97.3°F | Resp 18 | Ht 64.0 in | Wt 245.9 lb

## 2019-10-25 DIAGNOSIS — Z1501 Genetic susceptibility to malignant neoplasm of breast: Secondary | ICD-10-CM | POA: Insufficient documentation

## 2019-10-25 DIAGNOSIS — I63511 Cerebral infarction due to unspecified occlusion or stenosis of right middle cerebral artery: Secondary | ICD-10-CM | POA: Diagnosis not present

## 2019-10-25 DIAGNOSIS — Z8041 Family history of malignant neoplasm of ovary: Secondary | ICD-10-CM | POA: Diagnosis not present

## 2019-10-25 DIAGNOSIS — Z803 Family history of malignant neoplasm of breast: Secondary | ICD-10-CM | POA: Diagnosis not present

## 2019-10-25 DIAGNOSIS — Z1239 Encounter for other screening for malignant neoplasm of breast: Secondary | ICD-10-CM

## 2019-10-25 DIAGNOSIS — Z9189 Other specified personal risk factors, not elsewhere classified: Secondary | ICD-10-CM | POA: Diagnosis not present

## 2019-10-25 DIAGNOSIS — Z87891 Personal history of nicotine dependence: Secondary | ICD-10-CM | POA: Insufficient documentation

## 2019-10-25 DIAGNOSIS — Z1509 Genetic susceptibility to other malignant neoplasm: Secondary | ICD-10-CM | POA: Diagnosis not present

## 2019-10-25 DIAGNOSIS — Z8673 Personal history of transient ischemic attack (TIA), and cerebral infarction without residual deficits: Secondary | ICD-10-CM | POA: Diagnosis not present

## 2019-10-25 NOTE — Progress Notes (Signed)
[place]  Telephone:([phone]) [phone] Fax:(336) [phone]     ID: Beth [name] DOB: 1940/11/17  MR#: [id number]  IHK#:[id number]  Patient Care Team: Kathyrn Lass, MD as PCP - General (Family Medicine) Jettie Booze, MD as PCP - Cardiology (Cardiology) Kerryn Tennant, Virgie Dad, MD as Consulting Physician (Oncology) Josue Hector, MD as Consulting Physician (Cardiology) Rigoberto Noel, MD as Consulting Physician (Pulmonary Disease) Dohmeier, Asencion Partridge, MD as Consulting Physician (Neurology) Martinique, Amy, MD as Consulting Physician (Dermatology) Elsie Saas, MD as Consulting Physician (Orthopedic Surgery) Wonda Horner, MD as Consulting Physician (Gastroenterology) Isabel Caprice, MD as Consulting Physician (Gynecologic Oncology) OTHER MD:  CHIEF COMPLAINT: 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 She continues under observation. She was last seen on 12/10/2017.  She is accompanied by her husband  Since her last visit, she underwent bilateral diagnostic mammography with tomography at [facility] on 04/18/2019 showing: breast density category A; no evidence of malignancy in either breast.   She experienced a stroke on 05/16/2019.  She went through rehab both in the hospital and at home and has made a good recovery.  She is scheduled for surveillance pelvic ultrasound on 10/27/2019.  She would like to cancel that   REVIEW OF SYSTEMS: Beth [name] tells me her breathing is very limited.  She has a Rollator that she uses very well but the lungs are what keeps her from going really any distance.  She is on oxygen 24/7.  She has received the Pfizer vaccine and is already scheduled for the booster.  Currently she denies any unusual headaches visual changes nausea or vomiting.  There have been no recent falls.  She has mild weakness on the left  body and a little bit of paralysis in the right cheek area which causes a little bit of drooling at times.  Otherwise she has few residuals from her recent stroke.  Detailed review of systems was otherwise noncontributory   HISTORY  PRESENT ILLNESS: From the original intake note:  Beth [name] has a significant family history of breast and ovarian cancer and in fact her daughter [name] was my patient with breast cancer many years ago. Beth [name] subsequent had a second cancer which she also has survived. On the father's side the patient has little information except that her paternal grandmother had ovarian cancer and one of the paternal aunts also had ovarian cancer, both of them dying in their 39s.  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  She was referred for consideration of risk reducon and intensified screening strategies.   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    Vestibulitis of ear  PAST SURGICAL HISTORY: Past Surgical History:  Procedure Laterality Date   broken ankle Right    TONSILLECTOMY     TRANSCAROTID ARTERY REVASCULARIZATION Right 06/20/2019   TRANSCAROTID ARTERY REVASCULARIZATION Right 06/20/2019   Procedure: RIGHT TRANSCAROTID ARTERY REVASCULARIZATION;  Surgeon: Angelia Mould, MD;  Location: [facility] OR;   Service: Vascular;  Laterality: Right;    FAMILY HISTORY Family History  Problem Relation Age of Onset   Dementia Mother    Stroke Mother    Thyroid disease Mother    Osteoporosis Mother    Ovarian cancer Paternal Grandmother 58   Ovarian cancer Paternal Aunt 21   Breast cancer Daughter 18       breast ca x2, bilateral dx. 52, 46--estrogen   Other Daughter        no genetic testing as of 07/2015; has had ovaries removed   Other Daughter        BRCA 2 Pos.   Colon cancer Maternal Aunt        dx. unspecified age   Emphysema Maternal Uncle    High blood pressure Maternal Grandmother    Dementia Maternal Grandfather    Bleeding Disorder Maternal Grandfather    Emphysema Maternal Aunt    Emphysema Maternal Aunt     GYNECOLOGIC HISTORY:  Patient's last menstrual period was 01/14/1995 (approximate). Menarche age 33, first live birth age 65. The patient is GX P2. 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 Her husband Jenny Reichmann is a retired Charity fundraiser. He also worked for the [employer]. At home is just the 2 of them plus their dog. The patient's daughter, Beth [name], is BRCA positive, and has undergone a bilateral Pingel oophorectomy and lumpectomy. The patient's younger daughter Beth [name] is BRCA2 positive and she has undergone a total abdominal hysterectomy with bilateral salpingo-oophorectomy and also bilateral mastectomies.    ADVANCED DIRECTIVES: The patient's husband is her healthcare power of attorney   HEALTH MAINTENANCE: Social History   Tobacco Use   Smoking status: Former Smoker    Packs/day: 1.00    Years: 41.00    Pack years: 41.00    Quit date: 01/13/2006    Years since quitting: 13.7   Smokeless tobacco: Never Used  Scientific laboratory technician Use: Never used  Substance Use Topics   Alcohol use: No    Alcohol/week: 0.0 standard  drinks   Drug use: No     Colonoscopy: [place]  PAP:  Bone density: "normal"   Allergies  Allergen Reactions   Prozac [Fluoxetine Hcl] Other (See Comments)    seizures   Sulfa Antibiotics Nausea And Vomiting and Other (See Comments)    TINGLING IN LEGS ALSO   Micardis [Telmisartan] Swelling and Other (See Comments)    Swelling, bruising Swelling, bruising   Prednisone Other (See Comments)    mood changes Mood changes   Lisinopril-Hydrochlorothiazide Other (See Comments)    unknown   Augmentin [Amoxicillin-Pot Clavulanate] Rash   Avelox [Moxifloxacin Hcl In Nacl] Rash    LEVAQUIN IS OK-SEE NOTE   Norvasc [Amlodipine Besylate] Other (See Comments)    fatigue    Current Outpatient Medications  Medication Sig Dispense Refill   acetaminophen (TYLENOL) 325 MG tablet Take 1-2 tablets (325-650 mg total) by mouth every 4 (four) hours as needed for mild pain.     alum & mag hydroxide-simeth (MAALOX/MYLANTA) 200-200-20 MG/5ML suspension Take  15-30 mLs by mouth every 2 (two) hours as needed for indigestion. 355 mL 0   apixaban (ELIQUIS) 5 MG TABS tablet Take 1 tablet (5 mg total) by mouth 2 (two) times daily. 180 tablet 1   aspirin EC 81 MG tablet Take 81 mg by mouth daily.     atorvastatin (LIPITOR) 20 MG tablet Take 1 tablet (20 mg total) by mouth daily. 90 tablet 3   captopril (CAPOTEN) 50 MG tablet Take 50 mg by mouth 2 (two) times daily.     Cholecalciferol (VITAMIN D3) 2000 units capsule Take 2,000 Units by mouth daily.      clonazePAM (KLONOPIN) 2 MG tablet Take 1 tablet (2 mg total) by mouth at bedtime. For seizure activity or insomnia dohmeier. 90 tablet 1   diltiazem (CARDIZEM CD) 300 MG 24 hr capsule Take 1 capsule (300 mg total) by mouth daily. 90 capsule 3   famotidine (PEPCID) 20 MG tablet Take 1 tablet (20 mg total) by mouth 2 (two) times daily. 60 tablet 0   fluticasone (FLONASE) 50 MCG/ACT nasal spray Place 1 spray into the nose daily as needed for  allergies. Spray twice daily as needed      gabapentin (NEURONTIN) 600 MG tablet Take 0.5 tablets (300 mg total) by mouth at bedtime. 30 tablet 0   hydrocerin (EUCERIN) CREA Apply 1 application topically 2 (two) times daily.  0   levETIRAcetam (KEPPRA) 750 MG tablet Take 750 mg by mouth 2 (two) times daily.     levETIRAcetam 750 MG TB3D Take 750 mg by mouth 2 (two) times daily. 180 tablet 0   LEXAPRO 20 MG tablet Take 1 tablet (20 mg total) by mouth daily. Morning dosage 90 tablet 3   loratadine (CLARITIN) 10 MG tablet Take 1 tablet (10 mg total) by mouth daily.     Menthol-Methyl Salicylate (MUSCLE RUB) 10-15 % CREA Apply 1 application topically as needed for muscle pain. To neck and shoulder  0   polyethylene glycol (MIRALAX / GLYCOLAX) 17 g packet Take 17 g by mouth daily. Purchase bottle over the counter and use capful daily 14 each 0   PROAIR HFA 108 (90 Base) MCG/ACT inhaler USE 2 INHALATIONS EVERY 4 HOURS AS NEEDED FOR WHEEZING OR SHORTNESS OF BREATH 8.5 g 20   sodium chloride (OCEAN) 0.65 % SOLN nasal spray Place 1 spray into both nostrils in the morning, at noon, in the evening, and at bedtime.  0   STIOLTO RESPIMAT 2.5-2.5 MCG/ACT AERS INHALE 2 PUFFS INTO THE LUNGS DAILY 4 g 3   vitamin B-12 (CYANOCOBALAMIN) 500 MCG tablet Take 1 tablet (500 mcg total) by mouth daily.     No current facility-administered medications for this visit.    OBJECTIVE: White woman using oxygen by nasal cannula Vitals:   10/25/19 1410  BP: (!) 160/97  Pulse: 81  Resp: 18  Temp: (!) 97.3 F (36.3 C)  SpO2: 94%     Body mass index is 42.21 kg/m.    ECOG FS:2 - Symptomatic, <50% confined to bed  Sclerae unicteric, EOMs intact Wearing a mask No cervical or supraclavicular adenopathy Lungs no rales or rhonchi Heart irregular rate , rapid rhythm Abd soft, nontender, positive bowel sounds MSK mild kyphosis but no focal spinal tenderness Neuro: nonfocal, well oriented, appropriate  affect Breasts: No palpable masses in either breast, no skin or nipple changes of concern.  Both axillae are benign.   LAB RESULTS:  CMP     Component Value Date/Time  NA 138 06/20/2019 0657   K 3.8 06/20/2019 0657   CL 101 06/20/2019 0657   CO2 28 06/20/2019 0657   GLUCOSE 139 (H) 06/20/2019 0657   BUN 11 06/20/2019 0657   CREATININE 0.72 06/20/2019 0657   CREATININE 0.85 02/22/2018 1051   CALCIUM 8.9 06/20/2019 0657   PROT 6.7 06/20/2019 0657   ALBUMIN 3.3 (L) 06/20/2019 0657   AST 18 06/20/2019 0657   AST 18 02/22/2018 1051   ALT 16 06/20/2019 0657   ALT 16 02/22/2018 1051   ALKPHOS 54 06/20/2019 0657   BILITOT 0.3 06/20/2019 0657   BILITOT 0.6 02/22/2018 1051   GFRNONAA >60 06/20/2019 0657   GFRNONAA >60 02/22/2018 1051   GFRAA >60 06/20/2019 0657   GFRAA >60 02/22/2018 1051    No results found for: Ronnald Ramp, A1GS, A2GS, BETS, BETA2SER, GAMS, MSPIKE, SPEI  No results found for: Nils Pyle, [facility]  Lab Results  Component Value Date   WBC 8.7 06/22/2019   NEUTROABS 8.0 (H) 05/25/2019   HGB 10.2 (L) 06/22/2019   HCT 32.1 (L) 06/22/2019   MCV 102.6 (H) 06/22/2019   PLT 224 06/22/2019      Chemistry      Component Value Date/Time   NA 138 06/20/2019 0657   K 3.8 06/20/2019 0657   CL 101 06/20/2019 0657   CO2 28 06/20/2019 0657   BUN 11 06/20/2019 0657   CREATININE 0.72 06/20/2019 0657   CREATININE 0.85 02/22/2018 1051      Component Value Date/Time   CALCIUM 8.9 06/20/2019 0657   ALKPHOS 54 06/20/2019 0657   AST 18 06/20/2019 0657   AST 18 02/22/2018 1051   ALT 16 06/20/2019 0657   ALT 16 02/22/2018 1051   BILITOT 0.3 06/20/2019 0657   BILITOT 0.6 02/22/2018 1051       No results found for: LABCA2  No components found for: [id number]  No results for input(s): INR in the last 168 hours.  Urinalysis    Component Value Date/Time   COLORURINE STRAW (A) 05/27/2019 0837   APPEARANCEUR CLEAR 05/27/2019 0837    LABSPEC 1.006 05/27/2019 0837   PHURINE 7.0 05/27/2019 0837   GLUCOSEU NEGATIVE 05/27/2019 0837   HGBUR SMALL (A) 05/27/2019 0837   BILIRUBINUR NEGATIVE 05/27/2019 0837   KETONESUR NEGATIVE 05/27/2019 0837   PROTEINUR NEGATIVE 05/27/2019 0837   UROBILINOGEN 0.2 08/18/2014 1742   NITRITE NEGATIVE 05/27/2019 0837   LEUKOCYTESUR SMALL (A) 05/27/2019 0837    STUDIES: No results found.    ELIGIBLE FOR AVAILABLE RESEARCH PROTOCOL: No  ASSESSMENT: 79 y.o. BRCA2 positive [place] woman at increased risk for breast and ovarian cancer  (a) the specific BRCA2 mutation is [id number]).   (1) consider anastrozole for breast cancer risk reduction  (a) DEXA scan 01/16/2014 normal, with a T score of 0.7  (2) yearly MRI in addition to yearly mammography, 6 months apart, for intensified breast cancer screening  (3) unable to undergo bilateral salpingo-oophorectomy--followed by gynecological oncology with biannual pelvic ultrasound and Ca-125 determinations   PLAN: Quinesha has recovered well from her stroke.  She continues to have severe pulmonary problems which is the reason of course she is not able to undergo bilateral salpingo-oophorectomy.  At this point she prefers to forego screening for ovarian cancer.  We will try to alert the [facility] group regarding that.  We are also going to omit the MRI.  She had been scheduled for this but missed the appointment.  I think it would be difficult  for her to lie flat for 20 minutes and the oxygen also would complicate things.  Fortunately her breast density is category 8.  This means her mammogram is very sensitive.  She will have mammography in April and she will see me next May  She knows to call for any issue that may develop before the next visit  Total encounter time 25 minutes.*   Laterra Lubinski, Virgie Dad, MD  10/25/19 2:34 PM Medical Oncology and Hematology [facility] [place], [place]  [address] Tel. [phone]    Fax. [phone]   I, Wilburn Mylar, am acting as scribe for Dr. Virgie Dad. Rhodes Calvert.  I, Lurline Del MD, have reviewed the above documentation for accuracy and completeness, and I agree with the above.    *Total Encounter Time as defined by the Centers for Medicare and Medicaid Services includes, in addition to the face-to-face time of a patient visit (documented in the note above) non-face-to-face time: obtaining and reviewing outside history, ordering and reviewing medications, tests or procedures, care coordination (communications with other health care professionals or caregivers) and documentation in the medical record.

## 2019-10-25 NOTE — Telephone Encounter (Signed)
error 

## 2019-10-27 ENCOUNTER — Ambulatory Visit (HOSPITAL_COMMUNITY): Payer: Medicare Other

## 2019-11-01 ENCOUNTER — Encounter: Payer: Self-pay | Admitting: Primary Care

## 2019-11-01 ENCOUNTER — Ambulatory Visit (INDEPENDENT_AMBULATORY_CARE_PROVIDER_SITE_OTHER): Payer: Medicare Other | Admitting: Primary Care

## 2019-11-01 ENCOUNTER — Other Ambulatory Visit: Payer: Self-pay

## 2019-11-01 VITALS — BP 122/74 | HR 63 | Temp 97.4°F | Ht 64.0 in | Wt 248.2 lb

## 2019-11-01 DIAGNOSIS — J9611 Chronic respiratory failure with hypoxia: Secondary | ICD-10-CM | POA: Diagnosis not present

## 2019-11-01 DIAGNOSIS — I480 Paroxysmal atrial fibrillation: Secondary | ICD-10-CM

## 2019-11-01 DIAGNOSIS — J301 Allergic rhinitis due to pollen: Secondary | ICD-10-CM | POA: Diagnosis not present

## 2019-11-01 DIAGNOSIS — J449 Chronic obstructive pulmonary disease, unspecified: Secondary | ICD-10-CM

## 2019-11-01 DIAGNOSIS — I6389 Other cerebral infarction: Secondary | ICD-10-CM

## 2019-11-01 DIAGNOSIS — J42 Unspecified chronic bronchitis: Secondary | ICD-10-CM | POA: Diagnosis not present

## 2019-11-01 MED ORDER — BUDESONIDE 0.25 MG/2ML IN SUSP
0.2500 mg | Freq: Two times a day (BID) | RESPIRATORY_TRACT | 12 refills | Status: DC
Start: 2019-11-01 — End: 2019-11-30

## 2019-11-01 MED ORDER — IPRATROPIUM-ALBUTEROL 0.5-2.5 (3) MG/3ML IN SOLN: 3.0000 mL | Freq: Four times a day (QID) | RESPIRATORY_TRACT | 11 refills | Status: DC

## 2019-11-01 NOTE — Patient Instructions (Addendum)
COPD: Stop Stiolto once you get nebulizer machine and medication  Start Pulmicort nebulizer twice daily (steriod) Start Ipratropium-albuterol 3-4 times a day (bronchodilator)  Sinus congestion: Continue to take Mucinex twice daily Continue to use Flonase once daily Continue ocean nasal spray twice daily  Orders: Nebulizer machine EP:[facility]/ [employer]  [employer] continuous oxygen concentrator   Rx: Pulmicort twice a day 0.25mg  BID  Ipratropium-albuterol QID  Follow-up: 3 months with Dr. Elsworth Soho (or his first available)  I would discuss right temple pain further with neurologist or PCP. You are doing everything right for nasal congestion. Most likely related to recent stroke.

## 2019-11-01 NOTE — Progress Notes (Signed)
_0  ID: Beth [name], female    DOB: 07-10-1940, 79 y.o.   MRN: [id number]  Chief Complaint  Patient presents with  . Follow-up    Pt states she has been okay since last visit. Pt has had days where her breathing has been worse, has an occ cough with yellow to green phlegm. Pt also has a sharp pain on right temple area.    Referring provider: Kathyrn Lass, MD  HPI: 79 year old female, former smoker. PMH significant for COPD, chronic respiratory failure, HTN, organic brain syndrome, stroke, BRCA2 positive, Obesity. Patient of Dr. Elsworth Soho. She was started on Stiolto during her last visit, however, patient decided to change back to Anoro d/t cost.   Previous [facility] pulmonary encounters: 09/22/2018 Patient contacted today for 3 month follow-up. She is doing well, no acute complaints. Needs refill of her Anoro. She has not needed to use her rescuer inhaler and has not had any recent exacerbations of her COPD. Continues using 2L oxygen. Stopped her maxzide d/t a lower bp reading. States that if she eats a diet with high salt she notices some ankle swelling. Continues Allegra and flonase daily. Taking mucinex as needed for congestion. She has been venturing out of the house a little to meet friends in empty restaurant and wearing mask. Denies fever, cough, sob or wheezing.   05/02/2019 Patient presents today for regular follow-up. Last seen for video visit in September. Her breathing has been ok as long as she takes it easy. She gets winded when she rushes. She is wearing 2L oxygen. She denies any low oxygen levels at home. Right now the pollen has been causing her to have some nasal congestion/post nasal drip and mucus production getting stuck in the back of her throat. She just started taking Claritin. She has not tried nasal sprays but has simple saline and AYR at home. She would like to try going back to [facility], states that she experience some side effects with Anoro. Irregular heart beat, eye  floaters and does not like the dry power. These symptoms do not last long. She saw cardiology in October 2020. She has tried pulmonary rehab in the past but is not interested in returning at this time.   11/01/2019- Interim hx Patient presents today for 6 month follow-up. Since last visit she was hospitalized for Afib, right MCA stroke and right carotid stenosis in June. She has completed rehab, feels she has progressed well. Still struggles with left hand weakness/numbness and left foot. She still drops things fairly easily. She is unable to do anything that requires two hands at once. Her husband has to help her use Stiolto inhaler. She takes Stiolto at 10am and needs to use her Albuterol around 7pm at night. She has cough with mucus production. Associated shortness of breath and wheezing.  She has not previously tolerate oral prednisone in the past d/t mood changes, she is unsure if this was because of other medication she was taking at time and is open to trying steriod inhalers.   Significant tests/ events CT angio chest 07/2015 >>no pulmonary embolism, no emphysema, calcified mediastinal LNs PFTs 09/2015>>severe airway obstruction with FEV1 40%, improves to 47% with albuterol,DLCO decreased to 45%  Allergies  Allergen Reactions  . Prozac [Fluoxetine Hcl] Other (See Comments)    seizures  . Sulfa Antibiotics Nausea And Vomiting and Other (See Comments)    TINGLING IN LEGS ALSO  . Micardis [Telmisartan] Swelling and Other (See Comments)    Swelling,  bruising Swelling, bruising  . Prednisone Other (See Comments)    mood changes Mood changes  . Lisinopril-Hydrochlorothiazide Other (See Comments)    unknown  . Augmentin [Amoxicillin-Pot Clavulanate] Rash  . Avelox [Moxifloxacin Hcl In Nacl] Rash    LEVAQUIN IS OK-SEE NOTE  . Norvasc [Amlodipine Besylate] Other (See Comments)    fatigue    Immunization History  Administered Date(s) Administered  . Influenza, High Dose Seasonal PF  10/27/2017, 09/14/2018, 10/24/2019  . Influenza-Unspecified 10/08/2015  . PFIZER SARS-COV-2 Vaccination 02/02/2019, 02/23/2019    Past Medical History:  Diagnosis Date  . Afib ([place])    diagnosed 05/17/19  . Arthritis   . BRCA2 positive 2017  . COPD (chronic obstructive pulmonary disease) ([place])   . Diverticulosis   . Dysrhythmia    Afib  . Hearing loss   . HTN (hypertension)   . Memory loss   . Pre-diabetes   . Seasonal depression ([place])   . 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 Seizures [facility])    anniversary seizure - 1 year after serizure  . Stroke ([place])   . 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   . Vestibulitis of ear     Tobacco History: Social History   Tobacco Use  Smoking Status Former Smoker  . Packs/day: 1.00  . Years: 41.00  . Pack years: 41.00  . Quit date: 01/13/2006  . Years since quitting: 13.8  Smokeless Tobacco Never Used   Counseling given: Not Answered   Outpatient Medications Prior to Visit  Medication Sig Dispense Refill  . acetaminophen (TYLENOL) 325 MG tablet Take 1-2 tablets (325-650 mg total) by mouth every 4 (four) hours as needed for mild pain.    Marland Kitchen apixaban (ELIQUIS) 5 MG TABS tablet Take 1 tablet (5 mg total) by mouth 2 (two) times daily. 180 tablet 1  . aspirin EC 81 MG tablet Take 81 mg by mouth daily.    Marland Kitchen atorvastatin (LIPITOR) 20 MG tablet Take 1 tablet (20 mg total) by mouth daily. 90 tablet 3  . captopril (CAPOTEN) 50 MG tablet Take 50 mg by mouth 2 (two) times daily.    . Cholecalciferol (VITAMIN D3) 2000 units capsule Take 2,000 Units by mouth daily.     . clonazePAM (KLONOPIN) 2 MG tablet Take 1 tablet (2 mg total) by mouth at bedtime. For seizure activity or insomnia dohmeier. 90 tablet 1  . diltiazem (CARDIZEM CD) 300 MG 24 hr capsule Take 1 capsule (300 mg total) by mouth daily. 90 capsule 3  . famotidine (PEPCID) 20 MG tablet Take 1 tablet (20 mg total) by mouth 2  (two) times daily. 60 tablet 0  . fluticasone (FLONASE) 50 MCG/ACT nasal spray Place 1 spray into the nose daily as needed for allergies. Spray twice daily as needed     . gabapentin (NEURONTIN) 600 MG tablet Take 0.5 tablets (300 mg total) by mouth at bedtime. 30 tablet 0  . hydrocerin (EUCERIN) CREA Apply 1 application topically 2 (two) times daily.  0  . levETIRAcetam (KEPPRA) 750 MG tablet Take 750 mg by mouth 2 (two) times daily.    Marland Kitchen LEXAPRO 20 MG tablet Take 1 tablet (20 mg total) by mouth daily. Morning dosage 90 tablet 3  . loratadine (CLARITIN) 10 MG tablet Take 1 tablet (10 mg total) by mouth daily.    . Menthol-Methyl Salicylate (MUSCLE RUB) 10-15 % CREA Apply 1 application topically as needed for muscle pain. To neck  and shoulder  0  . polyethylene glycol (MIRALAX / GLYCOLAX) 17 g packet Take 17 g by mouth daily. Purchase bottle over the counter and use capful daily 14 each 0  . PROAIR HFA 108 (90 Base) MCG/ACT inhaler USE 2 INHALATIONS EVERY 4 HOURS AS NEEDED FOR WHEEZING OR SHORTNESS OF BREATH 8.5 g 20  . sodium chloride (OCEAN) 0.65 % SOLN nasal spray Place 1 spray into both nostrils in the morning, at noon, in the evening, and at bedtime.  0  . vitamin B-12 (CYANOCOBALAMIN) 500 MCG tablet Take 1 tablet (500 mcg total) by mouth daily.    Marland Kitchen STIOLTO RESPIMAT 2.5-2.5 MCG/ACT AERS INHALE 2 PUFFS INTO THE LUNGS DAILY 4 g 3  . alum & mag hydroxide-simeth (MAALOX/MYLANTA) 200-200-20 MG/5ML suspension Take 15-30 mLs by mouth every 2 (two) hours as needed for indigestion. 355 mL 0  . levETIRAcetam 750 MG TB3D Take 750 mg by mouth 2 (two) times daily. 180 tablet 0   No facility-administered medications prior to visit.    Review of Systems  Review of Systems  Constitutional: Negative.   HENT: Positive for congestion.   Respiratory: Positive for cough, shortness of breath and wheezing.   Cardiovascular: Negative.    Physical Exam  BP 122/74 (BP Location: Left Wrist, Cuff Size:  Normal)   Pulse 63   Temp (!) 97.4 F (36.3 C) (Other (Comment)) Comment (Src): wrist  Ht _0  (1.626 m)   Wt 248 lb 3.2 oz (112.6 kg)   LMP 01/14/1995 (Approximate)   SpO2 97%   BMI 42.60 kg/m  Physical Exam Constitutional:      Appearance: Normal appearance.  HENT:     Head: Normocephalic and atraumatic.     Mouth/Throat:     Mouth: Mucous membranes are moist.     Pharynx: Oropharynx is clear.  Cardiovascular:     Rate and Rhythm: Normal rate and regular rhythm.  Pulmonary:     Effort: Pulmonary effort is normal.     Breath sounds: Wheezing present. No rhonchi or rales.  Musculoskeletal:     Cervical back: Normal range of motion and neck supple.     Comments: Amb with walker  Neurological:     Mental Status: She is alert and oriented to person, place, and time.     Motor: Weakness present.     Comments: Left hand wekness  Psychiatric:        Mood and Affect: Mood normal.        Behavior: Behavior normal.        Thought Content: Thought content normal.        Judgment: Judgment normal.      Lab Results:  CBC    Component Value Date/Time   WBC 8.7 06/22/2019 0401   RBC 3.13 (L) 06/22/2019 0401   HGB 10.2 (L) 06/22/2019 0401   HGB 13.5 02/22/2018 1051   HCT 32.1 (L) 06/22/2019 0401   HCT 40.3 05/16/2019 1839   PLT 224 06/22/2019 0401   PLT 233 02/22/2018 1051   MCV 102.6 (H) 06/22/2019 0401   MCH 32.6 06/22/2019 0401   MCHC 31.8 06/22/2019 0401   RDW 13.2 06/22/2019 0401   LYMPHSABS 1.6 05/25/2019 0543   MONOABS 1.0 05/25/2019 0543   EOSABS 0.3 05/25/2019 0543   BASOSABS 0.1 05/25/2019 0543    BMET    Component Value Date/Time   NA 138 06/20/2019 0657   K 3.8 06/20/2019 0657   CL 101 06/20/2019 0657   CO2  28 06/20/2019 0657   GLUCOSE 139 (H) 06/20/2019 0657   BUN 11 06/20/2019 0657   CREATININE 0.72 06/20/2019 0657   CREATININE 0.85 02/22/2018 1051   CALCIUM 8.9 06/20/2019 0657   GFRNONAA >60 06/20/2019 0657   GFRNONAA >60 02/22/2018 1051    GFRAA >60 06/20/2019 0657   GFRAA >60 02/22/2018 1051    BNP    Component Value Date/Time   BNP 24.5 07/30/2015 2103    ProBNP No results found for: PROBNP  Imaging: No results found.   Assessment & Plan:   COPD (chronic obstructive pulmonary disease) ([place]) Experiencing increased dyspnea symptoms in the evening, approx 8 hours after using LABA/LAMA. Associated cough and wheezing. Difficulty using Stiolto Respimat INHALER d/t recent stroke. Recommend changing medication to nebulizer form and adding ICS.   Plan: - Stop Stiolto once you get nebulizer machine and medication  - Start Pulmicort nebulizer twice daily (steriod) - Start Ipratropium-albuterol 3-4 times a day (bronchodilator) - Continue to take Mucinex twice daily - FU in 3 months with Dr. Elsworth Soho    Allergic rhinitis - Continue to use Flonase once daily and ocean nasal spray twice daily  Chronic respiratory failure (HCC) - O2 97% 3L - Refer for [employer] continuous oxygen concentrator   Paroxysmal atrial fibrillation (HCC) - RRR, HR 63 - Continue Eliquis 59m twice daily   EMartyn Ehrich NP 11/02/2019

## 2019-11-02 DIAGNOSIS — H5202 Hypermetropia, left eye: Secondary | ICD-10-CM | POA: Diagnosis not present

## 2019-11-02 DIAGNOSIS — H35033 Hypertensive retinopathy, bilateral: Secondary | ICD-10-CM | POA: Diagnosis not present

## 2019-11-02 DIAGNOSIS — Z9849 Cataract extraction status, unspecified eye: Secondary | ICD-10-CM | POA: Diagnosis not present

## 2019-11-02 DIAGNOSIS — Z961 Presence of intraocular lens: Secondary | ICD-10-CM | POA: Diagnosis not present

## 2019-11-02 DIAGNOSIS — H524 Presbyopia: Secondary | ICD-10-CM | POA: Diagnosis not present

## 2019-11-02 DIAGNOSIS — H26493 Other secondary cataract, bilateral: Secondary | ICD-10-CM | POA: Diagnosis not present

## 2019-11-02 DIAGNOSIS — H52223 Regular astigmatism, bilateral: Secondary | ICD-10-CM | POA: Diagnosis not present

## 2019-11-02 NOTE — Assessment & Plan Note (Addendum)
-   Right MCA stroke, no bleed. Origin felt to be related to right carotid stenosis or embolism from Afib - Following with Neurology, Dr. Brett Fairy

## 2019-11-02 NOTE — Assessment & Plan Note (Signed)
-   Continue to use Flonase once daily and ocean nasal spray twice daily

## 2019-11-02 NOTE — Assessment & Plan Note (Addendum)
-   RRR, HR 63 - Continue Eliquis 5mg  twice daily

## 2019-11-02 NOTE — Assessment & Plan Note (Addendum)
-   O2 97% 3L - Refer for Inogen continuous oxygen concentrator

## 2019-11-02 NOTE — Assessment & Plan Note (Addendum)
Experiencing increased dyspnea symptoms in the evening, approx 8 hours after using LABA/LAMA. Associated cough and wheezing. Difficulty using Stiolto Respimat INHALER d/t recent stroke. Recommend changing medication to nebulizer form and adding ICS.   Plan: - Stop Stiolto once you get nebulizer machine and medication  - Start Pulmicort nebulizer twice daily (steriod) - Start Ipratropium-albuterol 3-4 times a day (bronchodilator) - Continue to take Mucinex twice daily - FU in 3 months with Dr. Elsworth Soho

## 2019-11-08 ENCOUNTER — Telehealth: Payer: Self-pay | Admitting: Primary Care

## 2019-11-09 NOTE — Telephone Encounter (Signed)
Spoke with pt. Advised her that her insurance will not cover the cost of 2 concentrators. Pt is going to contact [facility] and see how much it would cost to pay out of pocket for the POC. Nothing further was needed.

## 2019-11-11 DIAGNOSIS — E119 Type 2 diabetes mellitus without complications: Secondary | ICD-10-CM | POA: Diagnosis not present

## 2019-11-11 DIAGNOSIS — J449 Chronic obstructive pulmonary disease, unspecified: Secondary | ICD-10-CM | POA: Diagnosis not present

## 2019-11-11 DIAGNOSIS — I4891 Unspecified atrial fibrillation: Secondary | ICD-10-CM | POA: Diagnosis not present

## 2019-11-11 DIAGNOSIS — I10 Essential (primary) hypertension: Secondary | ICD-10-CM | POA: Diagnosis not present

## 2019-11-11 DIAGNOSIS — F3341 Major depressive disorder, recurrent, in partial remission: Secondary | ICD-10-CM | POA: Diagnosis not present

## 2019-11-11 DIAGNOSIS — E7849 Other hyperlipidemia: Secondary | ICD-10-CM | POA: Diagnosis not present

## 2019-11-11 DIAGNOSIS — E1169 Type 2 diabetes mellitus with other specified complication: Secondary | ICD-10-CM | POA: Diagnosis not present

## 2019-11-16 ENCOUNTER — Telehealth: Payer: Self-pay | Admitting: Primary Care

## 2019-11-16 NOTE — Telephone Encounter (Signed)
Plan: - Stop Stiolto once you get nebulizer machine and medication  - Start Pulmicort nebulizer twice daily (steriod) - Start Ipratropium-albuterol 3-4 times a day (bronchodilator) - Continue to take Mucinex twice daily - FU in 3 months with Dr. Tonie Griffith and spoke with pt letting her know the instructions for the nebulizer solutions and she verbalized understanding. Nothing further needed.

## 2019-11-16 NOTE — Telephone Encounter (Signed)
Patient had questions regarding medications that she received.  There are different instructions.  Patient requesting call back today to able to take medication.  Please advise.  [phone].

## 2019-11-17 ENCOUNTER — Telehealth: Payer: Self-pay | Admitting: *Deleted

## 2019-11-17 NOTE — Telephone Encounter (Signed)
Called the patient to see if she wanted her appts to still be canceled. Patient wants to be appts

## 2019-11-21 ENCOUNTER — Encounter: Payer: Self-pay | Admitting: Gynecologic Oncology

## 2019-11-21 ENCOUNTER — Encounter: Payer: Medicare Other | Attending: Family Medicine | Admitting: Skilled Nursing Facility1

## 2019-11-21 ENCOUNTER — Inpatient Hospital Stay (HOSPITAL_BASED_OUTPATIENT_CLINIC_OR_DEPARTMENT_OTHER): Payer: Medicare Other | Admitting: Gynecologic Oncology

## 2019-11-21 ENCOUNTER — Other Ambulatory Visit: Payer: Self-pay | Admitting: Oncology

## 2019-11-21 ENCOUNTER — Other Ambulatory Visit: Payer: Self-pay

## 2019-11-21 ENCOUNTER — Ambulatory Visit: Payer: Medicare Other | Admitting: Gynecologic Oncology

## 2019-11-21 ENCOUNTER — Encounter: Payer: Self-pay | Admitting: Skilled Nursing Facility1

## 2019-11-21 ENCOUNTER — Other Ambulatory Visit: Payer: Medicare Other

## 2019-11-21 ENCOUNTER — Ambulatory Visit: Payer: Medicare Other | Admitting: Skilled Nursing Facility1

## 2019-11-21 ENCOUNTER — Inpatient Hospital Stay: Payer: Medicare Other | Attending: Oncology

## 2019-11-21 VITALS — BP 136/50 | HR 100 | Temp 97.1°F | Resp 18 | Wt 251.0 lb

## 2019-11-21 DIAGNOSIS — Z1273 Encounter for screening for malignant neoplasm of ovary: Secondary | ICD-10-CM | POA: Diagnosis not present

## 2019-11-21 DIAGNOSIS — Z1502 Genetic susceptibility to malignant neoplasm of ovary: Secondary | ICD-10-CM | POA: Insufficient documentation

## 2019-11-21 DIAGNOSIS — Z8673 Personal history of transient ischemic attack (TIA), and cerebral infarction without residual deficits: Secondary | ICD-10-CM | POA: Insufficient documentation

## 2019-11-21 DIAGNOSIS — E119 Type 2 diabetes mellitus without complications: Secondary | ICD-10-CM | POA: Insufficient documentation

## 2019-11-21 DIAGNOSIS — Z6841 Body Mass Index (BMI) 40.0 and over, adult: Secondary | ICD-10-CM | POA: Diagnosis not present

## 2019-11-21 DIAGNOSIS — I10 Essential (primary) hypertension: Secondary | ICD-10-CM | POA: Diagnosis not present

## 2019-11-21 DIAGNOSIS — Z9981 Dependence on supplemental oxygen: Secondary | ICD-10-CM | POA: Insufficient documentation

## 2019-11-21 DIAGNOSIS — Z79899 Other long term (current) drug therapy: Secondary | ICD-10-CM | POA: Insufficient documentation

## 2019-11-21 DIAGNOSIS — Z7984 Long term (current) use of oral hypoglycemic drugs: Secondary | ICD-10-CM | POA: Insufficient documentation

## 2019-11-21 DIAGNOSIS — Z7901 Long term (current) use of anticoagulants: Secondary | ICD-10-CM | POA: Insufficient documentation

## 2019-11-21 DIAGNOSIS — Z148 Genetic carrier of other disease: Secondary | ICD-10-CM

## 2019-11-21 DIAGNOSIS — Z8041 Family history of malignant neoplasm of ovary: Secondary | ICD-10-CM

## 2019-11-21 DIAGNOSIS — Z1501 Genetic susceptibility to malignant neoplasm of breast: Secondary | ICD-10-CM

## 2019-11-21 DIAGNOSIS — I4891 Unspecified atrial fibrillation: Secondary | ICD-10-CM | POA: Diagnosis not present

## 2019-11-21 DIAGNOSIS — M1990 Unspecified osteoarthritis, unspecified site: Secondary | ICD-10-CM | POA: Insufficient documentation

## 2019-11-21 DIAGNOSIS — Z87891 Personal history of nicotine dependence: Secondary | ICD-10-CM | POA: Insufficient documentation

## 2019-11-21 DIAGNOSIS — Z7951 Long term (current) use of inhaled steroids: Secondary | ICD-10-CM | POA: Insufficient documentation

## 2019-11-21 DIAGNOSIS — Z1509 Genetic susceptibility to other malignant neoplasm: Secondary | ICD-10-CM

## 2019-11-21 DIAGNOSIS — Z7982 Long term (current) use of aspirin: Secondary | ICD-10-CM | POA: Diagnosis not present

## 2019-11-21 DIAGNOSIS — J449 Chronic obstructive pulmonary disease, unspecified: Secondary | ICD-10-CM | POA: Diagnosis not present

## 2019-11-21 DIAGNOSIS — R978 Other abnormal tumor markers: Secondary | ICD-10-CM

## 2019-11-21 DIAGNOSIS — Z9189 Other specified personal risk factors, not elsewhere classified: Secondary | ICD-10-CM

## 2019-11-21 DIAGNOSIS — Z803 Family history of malignant neoplasm of breast: Secondary | ICD-10-CM | POA: Diagnosis not present

## 2019-11-21 DIAGNOSIS — Z1239 Encounter for other screening for malignant neoplasm of breast: Secondary | ICD-10-CM

## 2019-11-21 LAB — COMPREHENSIVE METABOLIC PANEL
ALT: 10 U/L (ref 0–44)
AST: 12 U/L — ABNORMAL LOW (ref 15–41)
Albumin: 3.6 g/dL (ref 3.5–5.0)
Alkaline Phosphatase: 59 U/L (ref 38–126)
Anion gap: 9 (ref 5–15)
BUN: 15 mg/dL (ref 8–23)
CO2: 32 mmol/L (ref 22–32)
Calcium: 9.3 mg/dL (ref 8.9–10.3)
Chloride: 101 mmol/L (ref 98–111)
Creatinine, Ser: 0.98 mg/dL (ref 0.44–1.00)
GFR, Estimated: 59 mL/min — ABNORMAL LOW (ref >60)
Glucose, Bld: 112 mg/dL — ABNORMAL HIGH (ref 70–99)
Potassium: 4 mmol/L (ref 3.5–5.1)
Sodium: 142 mmol/L (ref 135–145)
Total Bilirubin: 0.5 mg/dL (ref 0.3–1.2)
Total Protein: 7.4 g/dL (ref 6.5–8.1)

## 2019-11-21 LAB — CBC WITH DIFFERENTIAL/PLATELET
Abs Immature Granulocytes: 0.1 10*3/uL — ABNORMAL HIGH (ref 0.00–0.07)
Basophils Absolute: 0.1 10*3/uL (ref 0.0–0.1)
Basophils Relative: 1 %
Eosinophils Absolute: 0.2 10*3/uL (ref 0.0–0.5)
Eosinophils Relative: 2 %
HCT: 39.8 % (ref 36.0–46.0)
Hemoglobin: 12.7 g/dL (ref 12.0–15.0)
Immature Granulocytes: 1 %
Lymphocytes Relative: 20 %
Lymphs Abs: 2.1 10*3/uL (ref 0.7–4.0)
MCH: 30.9 pg (ref 26.0–34.0)
MCHC: 31.9 g/dL (ref 30.0–36.0)
MCV: 96.8 fL (ref 80.0–100.0)
Monocytes Absolute: 0.9 10*3/uL (ref 0.1–1.0)
Monocytes Relative: 9 %
Neutro Abs: 6.9 10*3/uL (ref 1.7–7.7)
Neutrophils Relative %: 67 %
Platelets: 256 10*3/uL (ref 150–400)
RBC: 4.11 MIL/uL (ref 3.87–5.11)
RDW: 14.2 % (ref 11.5–15.5)
WBC: 10.2 10*3/uL (ref 4.0–10.5)
nRBC: 0 % (ref 0.0–0.2)

## 2019-11-21 NOTE — Progress Notes (Signed)
Gynecologic Oncology Return Clinic Visit  11/21/19  Reason for Visit: Screening visit in the setting of BRCA2 positive  Treatment History: Ms. Pelphrey wasinitially seen in consultationby Dr. Alycia Rossetti for BRCA 2 gene positivity at the request of Dr. Jana Hakim. She opted for testing for BRCA mutation due to her strong family history of breast and ovarian cancer. Of note, her daughter was found to be positive after being diagnosed with breast cancer. Per history, her paternal grandmother had ovarian cancer age of 87 and her paternal aunt had ovarian cancer at the age of 33.  Given the patient's past medical history significant for COPD with oxygen dependency, obesity, anda stroke causing her to have residual seizure disorder, it was determined at her initial consultation that risk reducing BSO was too risky from a pulmonary and neurologic standpoint. Thus the plan was made for surveillance ultrasounds and Ca1 25 every 35-month  Imaging 09/2016 - TVUS ovaries not seen, no adnexal mass 02/2017 - TVUS ovaries not seen, no adnexal mass 08/2017 -TVUS ovaries not seen, no adnexal mass, continued calcified fibroid 02/2018-TVUS ovaries not seen, no adnexal mass, continued calcified fibroid (unchanged) 10/2018 - TVUS ovaries not seen, no adnexal mass, continued calcified fibroid, 460mfluid collection in LUS.  04/2019 - TVUS ovaries not seen, no adnexal mass, 35m89mndometrial lining, no fluid noted.  CA-125 CA-125: 11.7-15.5 over the last 3 years  Interval History: Presents today for a visit in the setting of known BRCA2 mutation, unable to undergo risk-reducing surgery. Since her last visit with me, she experienced a stroke on 05/16/2019.  She went through rehab both in the hospital and at home and has made a good recovery. She continues to have some weakness and deficits on the left side and drops things more easily.She also has had intermittent episodes of urinary incontinence since her stroke.  She  denies any abdominal or pelvic pain. She denies vaginal bleeding or discharge. She has had occasional rectal bleeding which seems to occur when she has episodes of constipation.   She continues to follow-up with Dr. MagJana Hakimr breast cancer screening. She continues on home O2 at 2L. She sometimes has to increase to 3L if she gets short of breath. She was recently started on a new nebulizer last week and has felt more short of breath since then.  Past Medical/Surgical History: Past Medical History:  Diagnosis Date  . Afib ([facility]  diagnosed 05/17/19  . Arthritis   . BRCA2 positive 2017  . COPD (chronic obstructive pulmonary disease) ([facility] . Diverticulosis   . Dysrhythmia    Afib  . Hearing loss   . HTN (hypertension)   . Memory loss   . Pre-diabetes   . Seasonal depression ([facility] . Seizure disorder, complex partial, 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HCCFerriseizure free for one year  . [facility]  [facility] - 1 year after serizure  . Stroke ([place]  05/2019  . Stroke, hemorrhagic (HCCIowaneurysm bleed.   . Vestibulitis of ear     Past Surgical History:  Procedure Laterality Date  . broken ankle Right   . TONSILLECTOMY    . TRANSCAROTID ARTERY REVASCULARIZATION Right 06/20/2019  . TRANSCAROTID ARTERY REVASCULARIZATION Right 06/20/2019   Procedure: RIGHT TRANSCAROTID ARTERY REVASCULARIZATION;  Surgeon: DicAngelia MouldD;  [facility];  Service: Vascular;  Laterality: Right;    Family History  Problem Relation Age of Onset  . Dementia Mother   . Stroke Mother   .  Thyroid disease Mother   . Osteoporosis Mother   . Ovarian cancer Paternal Grandmother 84  . Ovarian cancer Paternal Aunt 67  . Breast cancer Daughter 58       breast ca x2, bilateral dx. 38, 46--estrogen  . Other Daughter        no genetic testing as of 07/2015; has had ovaries removed  . Other Daughter        BRCA 2 Pos.  . Colon cancer Maternal Aunt        dx. unspecified age  .  Emphysema Maternal Uncle   . High blood pressure Maternal Grandmother   . Dementia Maternal Grandfather   . Bleeding Disorder Maternal Grandfather   . Emphysema Maternal Aunt   . Emphysema Maternal Aunt     Social History   Socioeconomic History  . Marital status: Married    Spouse name: Jenny Reichmann  . Number of children: 2  . Years of education: 59  . Highest education level: Not on file  Occupational History  . Not on file  Tobacco Use  . Smoking status: Former Smoker    Packs/day: 1.00    Years: 41.00    Pack years: 41.00    Quit date: 01/13/2006    Years since quitting: 13.8  . Smokeless tobacco: Never Used  Vaping Use  . Vaping Use: Never used  Substance and Sexual Activity  . Alcohol use: No    Alcohol/week: 0.0 standard drinks  . Drug use: No  . Sexual activity: Never    Partners: Male    Birth control/protection: Post-menopausal  Other Topics Concern  . Not on file  Social History Narrative   Patient is married ([name]) and lives at home with her husband.   Patient is retired.   Patient has two children.   Patient has a high school education.   Patient is right-handed.   Patient drinks 2 big mugs of coffee daily and sometimes tea in the evenings.   Social Determinants of Health   Financial Resource Strain:   . Difficulty of Paying Living Expenses: Not on file  Food Insecurity:   . Worried About Charity fundraiser in the Last Year: Not on file  . Ran Out of Food in the Last Year: Not on file  Transportation Needs:   . Lack of Transportation (Medical): Not on file  . Lack of Transportation (Non-Medical): Not on file  Physical Activity:   . Days of Exercise per Week: Not on file  . Minutes of Exercise per Session: Not on file  Stress:   . Feeling of Stress : Not on file  Social Connections:   . Frequency of Communication with Friends and Family: Not on file  . Frequency of Social Gatherings with Friends and Family: Not on file  . Attends Religious Services:  Not on file  . Active Member of Clubs or Organizations: Not on file  . Attends Archivist Meetings: Not on file  . Marital Status: Not on file    Current Medications:  Current Outpatient Medications:  .  acetaminophen (TYLENOL) 325 MG tablet, Take 1-2 tablets (325-650 mg total) by mouth every 4 (four) hours as needed for mild pain., Disp: , Rfl:  .  apixaban (ELIQUIS) 5 MG TABS tablet, Take 1 tablet (5 mg total) by mouth 2 (two) times daily., Disp: 180 tablet, Rfl: 1 .  aspirin EC 81 MG tablet, Take 81 mg by mouth daily., Disp: , Rfl:  .  atorvastatin (LIPITOR)  20 MG tablet, Take 1 tablet (20 mg total) by mouth daily., Disp: 90 tablet, Rfl: 3 .  budesonide (PULMICORT) 0.25 MG/2ML nebulizer solution, Take 2 mLs (0.25 mg total) by nebulization in the morning and at bedtime., Disp: 60 mL, Rfl: 12 .  captopril (CAPOTEN) 50 MG tablet, Take 50 mg by mouth 2 (two) times daily., Disp: , Rfl:  .  Cholecalciferol (VITAMIN D3) 2000 units capsule, Take 2,000 Units by mouth daily. , Disp: , Rfl:  .  clonazePAM (KLONOPIN) 2 MG tablet, Take 1 tablet (2 mg total) by mouth at bedtime. For seizure activity or insomnia dohmeier., Disp: 90 tablet, Rfl: 1 .  diltiazem (CARDIZEM CD) 300 MG 24 hr capsule, Take 1 capsule (300 mg total) by mouth daily., Disp: 90 capsule, Rfl: 3 .  famotidine (PEPCID) 20 MG tablet, Take 1 tablet (20 mg total) by mouth 2 (two) times daily., Disp: 60 tablet, Rfl: 0 .  fluticasone (FLONASE) 50 MCG/ACT nasal spray, Place 1 spray into the nose daily as needed for allergies. Spray twice daily as needed , Disp: , Rfl:  .  gabapentin (NEURONTIN) 600 MG tablet, Take 0.5 tablets (300 mg total) by mouth at bedtime., Disp: 30 tablet, Rfl: 0 .  hydrocerin (EUCERIN) CREA, Apply 1 application topically 2 (two) times daily., Disp: , Rfl: 0 .  ipratropium-albuterol (DUONEB) 0.5-2.5 (3) MG/3ML SOLN, Take 3 mLs by nebulization in the morning, at noon, in the evening, and at bedtime., Disp: 360  mL, Rfl: 11 .  levETIRAcetam (KEPPRA) 750 MG tablet, Take 750 mg by mouth 2 (two) times daily., Disp: , Rfl:  .  LEXAPRO 20 MG tablet, Take 1 tablet (20 mg total) by mouth daily. Morning dosage, Disp: 90 tablet, Rfl: 3 .  loratadine (CLARITIN) 10 MG tablet, Take 1 tablet (10 mg total) by mouth daily., Disp: , Rfl:  .  Menthol-Methyl Salicylate (MUSCLE RUB) 10-15 % CREA, Apply 1 application topically as needed for muscle pain. To neck and shoulder, Disp: , Rfl: 0 .  metFORMIN (GLUCOPHAGE) 500 MG tablet, Take 500 mg by mouth daily., Disp: , Rfl:  .  polyethylene glycol (MIRALAX / GLYCOLAX) 17 g packet, Take 17 g by mouth daily. Purchase bottle over the counter and use capful daily, Disp: 14 each, Rfl: 0 .  PROAIR HFA 108 (90 Base) MCG/ACT inhaler, USE 2 INHALATIONS EVERY 4 HOURS AS NEEDED FOR WHEEZING OR SHORTNESS OF BREATH, Disp: 8.5 g, Rfl: 20 .  sodium chloride (OCEAN) 0.65 % SOLN nasal spray, Place 1 spray into both nostrils in the morning, at noon, in the evening, and at bedtime., Disp: , Rfl: 0 .  vitamin B-12 (CYANOCOBALAMIN) 500 MCG tablet, Take 1 tablet (500 mcg total) by mouth daily., Disp: , Rfl:   Review of Systems: Pertinent positives as per HPI.  Otherwise patient endorses decreased appetite since starting her nebulizers, weight gain, leg swelling. Denies fevers, chills, fatigue, unexplained weight changes. Denies hearing loss, neck lumps or masses, mouth sores, ringing in ears or voice changes. Denies cough or wheezing.   Denies chest pain or palpitations. Denies abdominal distention, pain, diarrhea, nausea, vomiting, or early satiety. Denies pain with intercourse, dysuria, frequency, hematuria. Denies hot flashes, pelvic pain, vaginal bleeding or vaginal discharge.   Denies joint pain, back pain or muscle pain/cramps. Denies itching, rash, or wounds. Denies dizziness, headaches, numbness or seizures. Denies swollen lymph nodes or glands, denies easy bruising or  bleeding.  Physical Exam: BP (!) 136/50 (BP Location: Right Arm, Patient Position:  Sitting)   Pulse 100   Temp (!) 97.1 F (36.2 C) (Tympanic)   Resp 18   Wt 251 lb (113.9 kg)   LMP 01/14/1995 (Approximate)   SpO2 98% Comment: 2lpm/[place]  BMI 43.08 kg/m  General: Alert, oriented, no acute distress.  HEENT: Atraumatic, normocephalic, sclera anicteric. Chest: Audibly labored breathing on 3 L of oxygen. Abdomen: Obese, soft, nontender.  Normoactive bowel sounds.  No masses or hepatosplenomegaly appreciated.   Extremities: 1+ edema bilaterally. Skin: No rashes or lesions noted. Lymphatics: No cervical, supraclavicular, or inguinal adenopathy. GU: Normal appearing external genitalia without erythema, excoriation, or lesions.  Bimanual exam is limited by body habitus.  No adnexal masses, fluctuance or nodularity appreciated.  Laboratory & Radiologic Studies: None new  Assessment & Plan: TAISLEY MORDAN is a 79 y.o. woman with a complicated medical history undergoing screening in the setting of a known BRCA2 mutation given that she is a poor surgical candidate.  The patient has had some significant health events this year but has recovered fairly well.  She presents today for screening in the setting of her known BRCA2 mutation with inability for surgical risk reduction.  She follows with medical oncology for breast cancer screening.  She had voiced to Dr. Jana Hakim that she wished to discontinue pelvic ultrasounds.  I think this is very reasonable.  She and I talked today about the fact that if she were found to have ovarian cancer or findings concerning for ovarian cancer, she would not be a candidate for surgery and likely would not be a candidate for any systemic therapy.  Given this, it would be reasonable to discontinue screening visits.  While neither pelvic ultrasound nor CA-125 are good screening tests, they are the tools that we have at our disposal in terms of following patients at  increased risk from a hereditary standpoint for ovarian cancer.  The patient voices that it would be important for her to know if she had ovarian cancer even if she was not able to receive any treatment.  Given this, her preference is to continue with twice yearly screening visits with me and we will evaluate at each visit whether to get a CA-125.  I discussed again that this is neither sensitive nor a specific test and can be elevated in many noncancerous disease processes and can be normal in a large portion of early stage ovarian cancer.  We reviewed signs and symptoms that would be concerning for ovarian cancer and she knows to call the clinic if she develops any of these before her next scheduled visit.  35 minutes of total time was spent for this patient encounter, including preparation, face-to-face counseling with the patient and coordination of care, and documentation of the encounter.  Jeral Pinch, MD  Division of Gynecologic Oncology  Department of Obstetrics and Gynecology  [facility]

## 2019-11-21 NOTE — Progress Notes (Signed)
Dr. Berline Lopes stopped by today after seeing [name].  She tells me that she told the patient she is really not an ovarian surgical candidate and therefore there really is no point for surveillance but the patient really wants to continue to see her so Dr. Berline Lopes will see her on a regular basis.  She has an appointment with me in May.  With category A breasts we do not need to do MRIs we will do mammogram and physical exam regularly.  Specifically her next mammogram will be in April 2022

## 2019-11-21 NOTE — Progress Notes (Signed)
  Assessment:  Primary concerns today: diabetes management.   Pt states she needed to take her inhaler due to needing to walk from the car. Pt states she is due for her nebulizer and was able to get it.  Pt arrives walking slowly and with her oxygen tank. 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  Pt does have COPD. Pt states she has diverticulosis but has never had a flare.  Pt states she has some swelling in her right leg and cannot exercise like she needs too. Pt states hse has worked with OT and PT. Pt states she has been feeling very thirsty and her nebulizer drys her mouth out. Pt states she has not had a seizure for many decades. Pt states she notices when she eats right she has a bowel movement every day. Pt states her and her husabnd eat out most of their meals out. Pt states she eats the salad part of the meal brought by her husband. Pt states she is trying to be organized with her medicines.  Goals:  -Get back into your therapies at home -Ask your daughter to set up alarms for your medications on a device such as the alexa   MEDICATIONS: see list   DIETARY INTAKE:  Usual eating pattern includes 2 meals and 1 snacks per day.  Everyday foods include non stated.  Avoided foods include- non stated  24-hr recall:  B (11 AM): fiber cereal + Citracal fiber  Snk ( AM): apple fritter (daily, pick on it throughout the day ending in the night) L ( PM): skipped citracel fiber Snk ( PM):  D ( PM): steak and salad  Snk ( PM):  Beverages: regular coffee + half and half cream, water, sparkling water, gatorade zero  Usual physical activity: ADL's  Estimated energy needs: 1500 calories 170 g carbohydrates 112 g protein 42 g fat  Progress Towards Goal(s):  In progress.   Nutritional Diagnosis:  NB-1.1 Food and nutrition-related knowledge deficit As related to complicated medicle history.  As evidenced by dietary recall and pt questions and 24 hr recall.    Intervention:  Nutrition counseling.Dietetian  educated pt on how to eat healthy at home and discuss about options to eat out. Dietitian educated pt on importance of incorporating non-starchy vegetables to her diet. Dietitian educated pt on good sources of protein in addition to limiting calorically dense food and eating out in general.  Goals: It would be best to limit how often you guys eat out When eating out do not choose: beef, pork, fried foods, soups When eating you meals always have a non starchy vegetables  Do not skip lunch  Ensure you are having 2 meals and 1 snack about 3 hours before laying down for bed   Teaching Method Utilized:  Visual Auditory Hands on  Handouts given during visit include:  Detailed my plate   Barriers to learning/adherence to lifestyle change: One side of her body is weak due to previous stroke.  Demonstrated degree of understanding via:  Teach Back   Monitoring/Evaluation:  Dietary intake, exercise, and body weight prn.

## 2019-11-21 NOTE — Patient Instructions (Signed)
Your exam is normal today. I will contact you with your CA-125 results. Per our discussion today, we will continue with visits every 6 months with a CA-125, understanding that this test can be elevated for non-cancerous reasons.   My schedule is not out past December. Please call back in the new year to get your 6 month follow-up scheduled with me in May. If you develop any symptoms in the meantime, please call the office at [phone],

## 2019-11-22 ENCOUNTER — Telehealth: Payer: Self-pay

## 2019-11-22 LAB — CA 125: Cancer Antigen (CA) 125: 15.2 U/mL (ref 0.0–38.1)

## 2019-11-22 NOTE — Telephone Encounter (Signed)
Pt requested that this nurse speak to her husband as she was in the middle of a nebulizer treatment. Told Mr. Colclasure that Ms Cravens's CA-125 from yesterday was WNL at 15.2.  Normal range is 0-38.1. Husband relayed information to wife while nurse on the phone.

## 2019-11-26 ENCOUNTER — Ambulatory Visit: Payer: Medicare Other | Attending: Internal Medicine

## 2019-11-26 ENCOUNTER — Other Ambulatory Visit: Payer: Self-pay

## 2019-11-26 DIAGNOSIS — Z23 Encounter for immunization: Secondary | ICD-10-CM

## 2019-11-26 NOTE — Progress Notes (Signed)
   Covid-19 Vaccination Clinic  Name:  Beth [name]    MRN: [id number] DOB: 06-15-1940  11/26/2019  Beth [name] was observed post Covid-19 immunization for 15 minutes without incident. She was provided with Vaccine Information Sheet and instruction to access the V-Safe system.   Beth [name] was instructed to call 911 with any severe reactions post vaccine: Marland Kitchen Difficulty breathing  . Swelling of face and throat  . A fast heartbeat  . A bad rash all over body  . Dizziness and weakness   Immunizations Administered    Name Date Dose VIS Date Route   Pfizer COVID-19 Vaccine 11/26/2019  3:15 PM 0.3 mL 11/02/2019 Intramuscular   Manufacturer: [place]   Lot: [id number]   [place]: [id number]

## 2019-11-30 ENCOUNTER — Telehealth: Payer: Self-pay | Admitting: Pulmonary Disease

## 2019-11-30 MED ORDER — BUDESONIDE 0.25 MG/2ML IN SUSP
0.2500 mg | Freq: Two times a day (BID) | RESPIRATORY_TRACT | 12 refills | Status: DC
Start: 2019-11-30 — End: 2020-04-03

## 2019-11-30 NOTE — Telephone Encounter (Signed)
Patient's husband Jenny Reichmann came by the office to ask about her Duoneb and Pulmicort nebs. Requested a refill of the Pulmicort as they are completely out. Husband asked if patient was going to be on these meds forever or if there was a plan. Advised them to discuss that with Dr. Elsworth Soho at next appointment. Patient is scheduled for appointment on 12/14 with Dr. Elsworth Soho. Refill has been sent in. Nothing further needed at this time.

## 2019-12-05 DIAGNOSIS — Z0000 Encounter for general adult medical examination without abnormal findings: Secondary | ICD-10-CM | POA: Diagnosis not present

## 2019-12-05 DIAGNOSIS — Z1389 Encounter for screening for other disorder: Secondary | ICD-10-CM | POA: Diagnosis not present

## 2019-12-06 DIAGNOSIS — T148XXA Other injury of unspecified body region, initial encounter: Secondary | ICD-10-CM | POA: Diagnosis not present

## 2019-12-06 DIAGNOSIS — Z7901 Long term (current) use of anticoagulants: Secondary | ICD-10-CM | POA: Diagnosis not present

## 2019-12-18 ENCOUNTER — Telehealth: Payer: Self-pay | Admitting: Pulmonary Disease

## 2019-12-18 MED ORDER — ALBUTEROL SULFATE (2.5 MG/3ML) 0.083% IN NEBU: 2.5000 mg | INHALATION_SOLUTION | RESPIRATORY_TRACT | 0 refills | Status: DC | PRN

## 2019-12-18 NOTE — Telephone Encounter (Signed)
Called for albuterol refill Sent to [facility]

## 2019-12-20 ENCOUNTER — Ambulatory Visit: Payer: Medicare Other | Admitting: Skilled Nursing Facility1

## 2019-12-27 ENCOUNTER — Encounter: Payer: Self-pay | Admitting: Pulmonary Disease

## 2019-12-27 ENCOUNTER — Ambulatory Visit (INDEPENDENT_AMBULATORY_CARE_PROVIDER_SITE_OTHER): Payer: Medicare Other

## 2019-12-27 ENCOUNTER — Other Ambulatory Visit: Payer: Self-pay

## 2019-12-27 ENCOUNTER — Ambulatory Visit (INDEPENDENT_AMBULATORY_CARE_PROVIDER_SITE_OTHER): Payer: Medicare Other | Admitting: Pulmonary Disease

## 2019-12-27 VITALS — BP 120/72 | HR 109 | Temp 97.2°F | Ht 64.0 in | Wt 250.2 lb

## 2019-12-27 DIAGNOSIS — I63511 Cerebral infarction due to unspecified occlusion or stenosis of right middle cerebral artery: Secondary | ICD-10-CM | POA: Diagnosis not present

## 2019-12-27 DIAGNOSIS — J90 Pleural effusion, not elsewhere classified: Secondary | ICD-10-CM | POA: Diagnosis not present

## 2019-12-27 DIAGNOSIS — J42 Unspecified chronic bronchitis: Secondary | ICD-10-CM

## 2019-12-27 DIAGNOSIS — J9611 Chronic respiratory failure with hypoxia: Secondary | ICD-10-CM | POA: Diagnosis not present

## 2019-12-27 DIAGNOSIS — R0600 Dyspnea, unspecified: Secondary | ICD-10-CM | POA: Diagnosis not present

## 2019-12-27 MED ORDER — BREZTRI AEROSPHERE 160-9-4.8 MCG/ACT IN AERO: 2.0000 | INHALATION_SPRAY | Freq: Two times a day (BID) | RESPIRATORY_TRACT | 0 refills | Status: DC

## 2019-12-27 MED ORDER — AEROCHAMBER PLUS FLO-VU MISC: 0 refills | Status: DC

## 2019-12-27 NOTE — Patient Instructions (Signed)
Trial of Breztri twice daily - sample  Use spacer Use albuterol MDI or nebs as needed every 6h in between CXR today

## 2019-12-27 NOTE — Assessment & Plan Note (Addendum)
Continue POC 2 L nasal cannula. Chest x-ray today since she has decreased breath sounds on the left side >> independently reviewed, no new infiltrate or effusion, left upper lobe calcified granuloma as prior

## 2019-12-27 NOTE — Assessment & Plan Note (Signed)
We will try to simplify her regimen.  Her weakness is on her left side.  We will provide her a sample of Breztri and a spacer and see if she can use this twice daily. If she can then we will discontinue budesonide and she can continue to use albuterol nebs in between as needed for breakthrough shortness of breath.  If she is unable to trial Breztri or this does not work, she can go back to her regimen of budesonide twice daily and duo nebs 4 times a day

## 2019-12-27 NOTE — Progress Notes (Signed)
   Subjective:    Patient ID: Beth [name], female    DOB: Dec 31, 1940, 79 y.o.   MRN: [id number]  HPI  79 year old female, former smoker for FU of COPD  PMH significant for  chronic respiratory failure, HTN, organic brain syndrome, stroke, BRCA2 positive, Obesity. 06/2019 Afib, right MCA stroke and right carotid stenosis 2009CVA -she had an aneurysmal bleedinright frontal and temporal lobes . Subsequent seizures. Has some word finding issues and cognitive deficits with emotional lability   I last saw her in 2019 Last office visit 10/2019 due to difficulty taking anoro/Stiolto inhaler, she was changed to Pulmicort and DuoNebs She is able to use this regimen, also using albuterol MDI in between, wonders if we can get her on an easier regimen. She arrives using a walker and portable oxygen.  Halting speech Having a bad breathing day today Her immunizations are up-to-date  Significant tests/ events reviewed CT angio chest 07/2015 >>no pulmonary embolism, no emphysema, calcified mediastinal LNs PFTs 09/2015>>severe airway obstruction with FEV1 40%, improves to 47% with albuterol,DLCO decreased to 45%   Review of Systems neg for any significant sore throat, dysphagia, itching, sneezing, nasal congestion or excess/ purulent secretions, fever, chills, sweats, unintended wt loss, pleuritic or exertional cp, hempoptysis, orthopnea pnd or change in chronic leg swelling. Also denies presyncope, palpitations, heartburn, abdominal pain, nausea, vomiting, diarrhea or change in bowel or urinary habits, dysuria,hematuria, rash, arthralgias, visual complaints, headache, numbness weakness or ataxia.     Objective:   Physical Exam  Gen. Pleasant, obese, in no distress, on nasal cannula ENT - no lesions, no post nasal drip Neck: No JVD, no thyromegaly, no carotid bruits Lungs: no use of accessory muscles, no dullness to percussion, decreased without rales or rhonchi  Cardiovascular: Rhythm  regular, heart sounds  normal, no murmurs or gallops, no peripheral edema Musculoskeletal: No deformities, no cyanosis or clubbing , no tremors Neuro -ambulates with walker, left-sided weakness, halting speech, mild aphasia       Assessment & Plan:

## 2020-01-10 ENCOUNTER — Ambulatory Visit: Payer: Medicare Other | Admitting: Neurology

## 2020-01-18 ENCOUNTER — Telehealth: Payer: Self-pay | Admitting: Pulmonary Disease

## 2020-01-18 MED ORDER — ALBUTEROL SULFATE HFA 108 (90 BASE) MCG/ACT IN AERS: 2.0000 | INHALATION_SPRAY | Freq: Four times a day (QID) | RESPIRATORY_TRACT | 1 refills | Status: DC | PRN

## 2020-01-18 MED ORDER — BREZTRI AEROSPHERE 160-9-4.8 MCG/ACT IN AERO: 2.0000 | INHALATION_SPRAY | Freq: Two times a day (BID) | RESPIRATORY_TRACT | 3 refills | Status: DC

## 2020-01-18 NOTE — Telephone Encounter (Signed)
Spoke with the pt  She states Beth [name] is working well for her and asking for rx for this and proair sent to [employer] sent  She states sometimes after she uses the breztri and rinses her mouth she swallows the water rather than spitting it out  I advised that she should stop this and spit out every time- advised to time inhaler use with dental care using inhaler 1st thing in the am and then brush her teeth after and rinse well  She verbalized understanding and nothing further needed

## 2020-01-25 ENCOUNTER — Encounter: Payer: Medicare Other | Attending: Family Medicine | Admitting: Skilled Nursing Facility1

## 2020-01-25 DIAGNOSIS — E119 Type 2 diabetes mellitus without complications: Secondary | ICD-10-CM | POA: Insufficient documentation

## 2020-02-07 ENCOUNTER — Encounter: Payer: Self-pay | Admitting: Neurology

## 2020-02-07 ENCOUNTER — Ambulatory Visit (INDEPENDENT_AMBULATORY_CARE_PROVIDER_SITE_OTHER): Payer: Medicare Other | Admitting: Neurology

## 2020-02-07 ENCOUNTER — Other Ambulatory Visit: Payer: Self-pay

## 2020-02-07 DIAGNOSIS — F09 Unspecified mental disorder due to known physiological condition: Secondary | ICD-10-CM

## 2020-02-07 DIAGNOSIS — I609 Nontraumatic subarachnoid hemorrhage, unspecified: Secondary | ICD-10-CM | POA: Diagnosis not present

## 2020-02-07 DIAGNOSIS — G40409 Other generalized epilepsy and epileptic syndromes, not intractable, without status epilepticus: Secondary | ICD-10-CM

## 2020-02-07 MED ORDER — GABAPENTIN 600 MG PO TABS: 300.0000 mg | ORAL_TABLET | Freq: Every day | ORAL | 3 refills | Status: DC

## 2020-02-07 MED ORDER — CLONAZEPAM 2 MG PO TABS: ORAL_TABLET | ORAL | 1 refills | Status: DC

## 2020-02-07 MED ORDER — LEVETIRACETAM 750 MG PO TABS: 750.0000 mg | ORAL_TABLET | Freq: Two times a day (BID) | ORAL | 3 refills | Status: DC

## 2020-02-07 NOTE — Progress Notes (Signed)
PATIENT: Beth [name] DOB: 31-Jul-1940  REASON FOR VISIT:  she  had a stroke, a left carotid stenosis, and atrial fib, d/c 5-10 2021 to inpatient rehab.  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  She has not had seizures since December 2019 but she was hospitalized with a new diagnosis of stroke and atrial fibrillation.  She underwent a right-sided carotid endarterectomy.   HISTORY FROM: patient, was incoherent.   patient with husband in the waiting room,  She is with walker and on a 02 concentrator.    1-25-2022Dominic [name], 80 year-old female with longstanding seizure disorder and dcerebrovasualr disease. Pt alone, rm 10. Presents today for follow up visit. Overall stable. Denies any sz like activity. Pt has concerned r/t her clonazepam. She states her PCP has a clinical pharmacist and they advised her that clonazepam was in 20 mg. (I informed the pt it is not) informed the pt for last couple years she has been getting 58m and ordered take 1 at bedtime. (pt then states that she remembers being told she could take 1.5 tab at bedtime if needed. She states that she has not needed an additional 0.5 tablet.  No new seizure activity. Declining cognitive ability, couldn't finish MOCA, did the mini-mental status exam. She reports difficulties to understand the TV news and speaks with dysarthria/ dysphonia. She is easily SOB.       I have the pleasure of meeting a long-term seizure patient Beth [name] on 07-11-2019- she is a Caucasian female right-handed and 80years of age.  She is accompanied today by her husband.   She took her medication in the morning and was trying to take her medication when she noted a flaccid left hand- this was 05-16-2019-and she was found to have a had a stroke, a left carotid stenosis, and atrial fib, d/c 5-10 2021 to inpatient rehab.  She has chronic organic brain syndrome and in the  past had amnestic mild cognitive impairment and some clonic seizures.  She has not had seizures since December 2019 but she was hospitalized with a new diagnosis of stroke and atrial fibrillation.  She underwent a right-sided carotid endarterectomy of which the scar is still visible today, she continues to walk with a walker nonseated is on chronic oxygen supplementation.  At 3 L a minute.  She has a mild tremor in her right hand this is minor and her left hand remained fisted but she is able to extend the fingers and do repeated alternating movements visit.  Her speech is fluent. She does have bilateral severe leg edema.    02-07-2019: Rv for Beth [name], [facility] Mrs. LAylen Stradford GJernbergis a meanwhile 80year old Caucasian right-handed female patient who is seen here after a 152-monthiatus.  She has felt very isolated during the Covid pandemic, just got her first shot of the vaccine.  Mr. GuHeyemory has further declined over the last year and it has led to some sometimes 10 situations.  She has had a stable MoCA test and she is feeling that she is able to take very well care of herself.  She has not had recent seizure activity and not since [place] last [place] She just gotten a shipment of generic Keppra since the insurance is not covering the brand name anymore.   She is due to her social isolation  somewhat frustrated and she endorsed the geriatric depression scale accordingly at 6 out of 15 points.  This seems to be situational and circumstantial..   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 In 2010 she injured her foot severely during a seizure and many surgeries to the ankle.  She became oxygen dependent in 2018 , has COPD. A cognitive decline has been evidient in her last 2 visits with [facility] and MMSE  testing. She has difficulties to structure her thoughts, is logorrhoeic and yet tangential, but this has improved with oxygen ! She is less fatigued. More alert. She reports today is very good day, and she also reports having very bad ones. Some days she is too tired , too fatigued to move.  Beth [name] had no interval seizure activity, this makes her seizure-free for 12 months.    She continues to take a baby aspirin, albuterol inhaler, atenolol tablets 25 mg her daily dose, captopril 2 times a day, vitamin D,  Lexapro, Allegra and Klonopin 2 mg at bedtime.  She also has Flonase nasal spray, Mucinex as needed for cough, she takes Keppra 750 mg twice a day, she also is on tiotropium bromide, on Maxide and on ellipta ANORO.   The bedtime medication was needed for nocturnal agitation, and seizure and insomnia. We are discussing her last MRI brain 04-2017, with mild microvascular changes, but a larger area of glioses from [facility].     Montreal Cognitive Assessment  02/07/2020 07/11/2019 02/07/2019 10/14/2017 04/08/2016  Visuospatial/ Executive (0/5) '2 3 4 4 3  ' Naming (0/3) '3 2 3 3 2  ' Attention: Read list of digits (0/2) '1 2 1 2 2  ' Attention: Read list of letters (0/1) '1 1 1 ' 0 1  Attention: Serial 7 subtraction starting at 100 (0/3) 0 '3 3 2 2  ' Language: Repeat phrase (0/2) - '2 1 2 2  ' Language : Fluency (0/1) - '1 1 1 ' 0  Abstraction (0/2) - '2 2 2 2  ' Delayed Recall (0/5) - '5 4 5 5  ' Orientation (0/6) '3 6 6 6 6  ' Total -'11 27 26 27 25  ' Adjusted Score (based on education) -11 - - 27 26           04-13-2017, Patient is here for seizure follow up, concentration and memory problems.  Beth [name] endorsed the geriatric depression score at 5 out of 15 points, which is a borderline result.  She states she had no seizure activity over the last 6-8 months, she is now using oxygen 24/7 portable tank is with her today. COPD  Beth [name] reports that some of her routines and rituals have changed-certainly over the  last year- again she feels that she is less organized, she has often skipped certain activities that she used to do in order. At  one time she accused her husband of getting lost while he was driving, because she could not recognize the surroundings. Not cooking or backing any longer. 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      History : MM Ms. Lovick is a 80 year old female with a history of seizures. She returns today for follow-up. She continues to take Keppra 750 mg twice a day. She denies any seizure events. She is able to complete all ADLs independently. She is currently not operating a motor vehicle.  In the past the patient has reported repeatedly trouble with her memory. At the last visit her memory score has remained stable. Patient reports that her memory continued to remained stable. She states that she is forgetful at times reports having a hard time recalling certain things such as a restaurant name. She states that she has been diagnosed with COPD. During this flare up she felt that she had an abnormal heart rhythm and was concerned about a potential stroke. However she reports she did not have any strokelike symptoms. She is currently on medication and inhalers to treat her COPD that she feels is working well for her. She returns today for an evaluation.   HISTORY 04/10/15: MM- Ms. Rosell is a 80 year old female with a history of seizures. She returns today for follow-up. She is currently taking Keppra 750 mg twice a day. She reports that she is tolerating this medication well. She denies any new neurological symptoms. She states at home she is able to complete all ADLs independently. She stopped driving after her stroke. 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 She states that at the last visit Dr. Brett Fairy ordered Belsomra and gave her samples however she did not start this medication. She states that she has continue using  the Klonopin. She reports that she has had some issues breathing and was given albuterol. She states that she is still having some difficulty breathing and her primary care mentioned a sleep study. She denies snoring. She denies any excessive daytime sleepiness. Denies a morning headache. She states that her husband has not witnessed any apnea events. The patient feels that her memory has remained stable. She just returned from traveling to see family members. She does report some numbness in the fingertips on the right hand. Occasionally will drop things in the right hand. However this time she does not want to proceed with any additional testing. She returns today for an evaluation.  HISTORY OF PRESENT ILLNESS: HISTORY 10/11/14 [facility]): Patient returned for a yearly follow up. She reports medical problems: a carbuncle with MRSA, diagnosed spring 2016 and treated with ATB( doxicycline) by Dr. Amy Martinique , her Dermatologist . The patient tolerated the oral doxycycline fine. Mrs. Colla's cognitive status remains stable we performed a [place] cognitive assessment test today and she scored well on the clock drawing . She has a mild tremor which impairs the contour of the clock face, she scored well on this Trail Making Test name 3 animals had some trouble again this tremor on drawing a cube. Was able to do the attention tests but not subtraction. Language and abstraction were tested the patient only had a word fluency test of 9 points. She was able to score 4 out of 5 recall words and was fully oriented to date place and time. She scored 26 out of 30 points, is a HS graduate. 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 No seizures in the interval time.   TARNISHA KACHMAR is a 80 y.o. female here seen originally many years ago as a referral from Dr.Kevin Little , Mrs. Tierney is an established patient of my practice. 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  She also has a history of hypertension, osteopenia, vertigo and some mild hearing loss, classified as vestibulitis in her older records. Surgical history is positive for right foot fracture  in May 2010 and 4 surgeries to the ankle. Her ankle was fractured in a seizure related incident 2010,  twisting her foot in the midst of her typical left twisting body movements during a seizure.  [name] brain injury in 2008 has affected her cognitively and she often struggles to find words. 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 She is taking 20 mg daily. She also takes vitamin D against fatigue. Her affect is better controlled and she feels more assured, and now is less socially isolated.  Mrs. Lineman had been seizure-free for over 12 months by the time of her April 2014 visit, now she reports that she had one seizure around Christmas 2014, while compliant on her current regimen of Keppra and Clonazepam.  She fills her medications at [facility] on [address] in [place].Vestibular evaluation for severe vertigo   REVIEW OF SYSTEMS: Out of a complete 14 system review of symptoms, the patient complains only of the following symptoms, and all other reviewed systems are negative.  MCI - since stroke, small vessel; diease. Seizures.   ALLERGIES: Allergies  Allergen Reactions  . Prozac [Fluoxetine Hcl] Other (See Comments)    seizures  . Sulfa Antibiotics Nausea And Vomiting and Other (See Comments)    TINGLING IN LEGS ALSO  . Micardis [Telmisartan] Swelling and Other (See Comments)    Swelling, bruising Swelling, bruising  . Prednisone Other (See Comments)    mood changes Mood changes  . Lisinopril-Hydrochlorothiazide Other (See Comments)    unknown  . Augmentin [Amoxicillin-Pot Clavulanate] Rash  . Avelox [Moxifloxacin Hcl In Nacl] Rash     LEVAQUIN IS OK-SEE NOTE  . Norvasc [Amlodipine Besylate] Other (See Comments)    fatigue    HOME MEDICATIONS: Outpatient Medications Prior to Visit  Medication Sig Dispense Refill  . acetaminophen (TYLENOL) 325 MG tablet Take 1-2 tablets (325-650 mg total) by mouth every 4 (four) hours as needed for mild pain.    Marland Kitchen albuterol (PROAIR HFA) 108 (90 Base) MCG/ACT inhaler Inhale 2 puffs into the lungs every 6 (six) hours as needed for wheezing or shortness of breath. 24 g 1  . apixaban (ELIQUIS) 5 MG TABS tablet Take 1 tablet (5 mg total) by mouth 2 (two) times daily. 180 tablet 1  . aspirin EC 81 MG tablet Take 81 mg by mouth daily.    Marland Kitchen atorvastatin (LIPITOR) 20 MG tablet Take 1 tablet (20 mg total) by mouth daily. 90 tablet 3  . Budeson-Glycopyrrol-Formoterol (BREZTRI AEROSPHERE) 160-9-4.8 MCG/ACT AERO Inhale 2 puffs into the lungs in the morning and at bedtime. 10.7 g 0  . Budeson-Glycopyrrol-Formoterol (BREZTRI AEROSPHERE) 160-9-4.8 MCG/ACT AERO Inhale 2 puffs into the lungs 2 (two) times daily. 32.1 g 3  . budesonide (PULMICORT) 0.25 MG/2ML nebulizer solution Take 2 mLs (0.25 mg total) by nebulization in the morning and at bedtime. 60 mL 12  . captopril (CAPOTEN) 50 MG tablet Take 50 mg by mouth 2 (two) times daily.    . Cholecalciferol (VITAMIN D3) 2000 units capsule Take 2,000 Units by mouth daily.     . clonazePAM (KLONOPIN) 2 MG tablet Take 1 tablet (2 mg total) by mouth at bedtime. For seizure activity or insomnia Foy Mungia. 90 tablet 1  . diltiazem (CARDIZEM CD) 300 MG 24 hr capsule Take 1 capsule (300 mg total) by mouth daily. 90 capsule 3  . famotidine (PEPCID) 20 MG tablet Take 1 tablet (20 mg total) by mouth 2 (two)  times daily. 60 tablet 0  . fluticasone (FLONASE) 50 MCG/ACT nasal spray Place 1 spray into the nose daily as needed for allergies. Spray twice daily as needed    . gabapentin (NEURONTIN) 600 MG tablet Take 0.5 tablets (300 mg total) by mouth at bedtime. 30 tablet 0  .  hydrocerin (EUCERIN) CREA Apply 1 application topically 2 (two) times daily.  0  . ipratropium-albuterol (DUONEB) 0.5-2.5 (3) MG/3ML SOLN Take 3 mLs by nebulization in the morning, at noon, in the evening, and at bedtime. 360 mL 11  . levETIRAcetam (KEPPRA) 750 MG tablet Take 750 mg by mouth 2 (two) times daily.    Marland Kitchen LEXAPRO 20 MG tablet Take 1 tablet (20 mg total) by mouth daily. Morning dosage 90 tablet 3  . loratadine (CLARITIN) 10 MG tablet Take 1 tablet (10 mg total) by mouth daily.    . Menthol-Methyl Salicylate (MUSCLE RUB) 10-15 % CREA Apply 1 application topically as needed for muscle pain. To neck and shoulder  0  . metFORMIN (GLUCOPHAGE) 500 MG tablet Take 500 mg by mouth daily.    . polyethylene glycol (MIRALAX / GLYCOLAX) 17 g packet Take 17 g by mouth daily. Purchase bottle over the counter and use capful daily 14 each 0  . PROAIR HFA 108 (90 Base) MCG/ACT inhaler USE 2 INHALATIONS EVERY 4 HOURS AS NEEDED FOR WHEEZING OR SHORTNESS OF BREATH 8.5 g 20  . sodium chloride (OCEAN) 0.65 % SOLN nasal spray Place 1 spray into both nostrils in the morning, at noon, in the evening, and at bedtime.  0  . Spacer/Aero-Holding Chambers (AEROCHAMBER PLUS WITH MASK) inhaler As needed with inhaler 1 each 0  . vitamin B-12 (CYANOCOBALAMIN) 500 MCG tablet Take 1 tablet (500 mcg total) by mouth daily.    Marland Kitchen albuterol (PROVENTIL) (2.5 MG/3ML) 0.083% nebulizer solution Take 3 mLs (2.5 mg total) by nebulization every 4 (four) hours as needed for wheezing or shortness of breath. 25 mL 0   No facility-administered medications prior to visit.    PAST MEDICAL HISTORY: Past Medical History:  Diagnosis Date  . Afib ([place])    diagnosed 05/17/19  . Arthritis   . BRCA2 positive 2017  . COPD (chronic obstructive pulmonary disease) ([place])   . Diabetes mellitus without complication ([place])   . Diverticulosis   . Dysrhythmia    Afib  . Hearing loss   . HTN (hypertension)   . Memory loss   . Pre-diabetes   .  Seasonal depression ([place])   . 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 Seizures [facility])    anniversary seizure - 1 year after serizure  . Stroke ([place])    05/2019  . 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   . Vestibulitis of ear     PAST SURGICAL HISTORY: Past Surgical History:  Procedure Laterality Date  . broken ankle Right   . TONSILLECTOMY    . TRANSCAROTID ARTERY REVASCULARIZATION Right 06/20/2019  . TRANSCAROTID ARTERY REVASCULARIZATION Right 06/20/2019   Procedure: RIGHT TRANSCAROTID ARTERY REVASCULARIZATION;  Surgeon: Angelia Mould, MD;  Location[facility];  Service: Vascular;  Laterality: Right;    FAMILY HISTORY: Family History  Problem Relation Age of Onset  . Dementia Mother   . Stroke Mother   . Thyroid disease Mother   . Osteoporosis Mother   . Ovarian cancer Paternal Grandmother 22  . Ovarian cancer Paternal Aunt 89  . Breast cancer Daughter 64  breast ca x2, bilateral dx. 38, 46--estrogen  . Other Daughter        no genetic testing as of 07/2015; has had ovaries removed  . Other Daughter        BRCA 2 Pos.  . Colon cancer Maternal Aunt        dx. unspecified age  . Emphysema Maternal Uncle   . High blood pressure Maternal Grandmother   . Dementia Maternal Grandfather   . Bleeding Disorder Maternal Grandfather   . Emphysema Maternal Aunt   . Emphysema Maternal Aunt     SOCIAL HISTORY: Social History   Socioeconomic History  . Marital status: Married    Spouse name: Jenny Reichmann  . Number of children: 2  . Years of education: 14  . Highest education level: Not on file  Occupational History  . Not on file  Tobacco Use  . Smoking status: Former Smoker    Packs/day: 1.00    Years: 41.00    Pack years: 41.00    Quit date: 01/13/2006    Years since quitting: 14.0  . Smokeless tobacco: Never Used  Vaping Use  . Vaping Use: Never used  Substance and Sexual Activity  . Alcohol use: No     Alcohol/week: 0.0 standard drinks  . Drug use: No  . Sexual activity: Never    Partners: Male    Birth control/protection: Post-menopausal  Other Topics Concern  . Not on file  Social History Narrative   Patient is married ([name]) and lives at home with her husband.   Patient is retired.   Patient has two children.   Patient has a high school education.   Patient is right-handed.   Patient drinks 2 big mugs of coffee daily and sometimes tea in the evenings.   Social Determinants of Health   Financial Resource Strain: Not on file  Food Insecurity: Not on file  Transportation Needs: Not on file  Physical Activity: Not on file  Stress: Not on file  Social Connections: Not on file  Intimate Partner Violence: Not on file      PHYSICAL EXAM  Vitals:   02/07/20 1455  BP: 125/75  Pulse: 99  Weight: 253 lb (114.8 kg)  Height: '5\' 4"'  (1.626 m)   Body mass index is 43.43 kg/m.  Result History    Montreal Cognitive Assessment  02/07/2020 07/11/2019 02/07/2019 10/14/2017 04/08/2016  Visuospatial/ Executive (0/5) '2 3 4 4 3  ' Naming (0/3) '3 2 3 3 2  ' Attention: Read list of digits (0/2) '1 2 1 2 2  ' Attention: Read list of letters (0/1) '1 1 1 ' 0 1  Attention: Serial 7 subtraction starting at 100 (0/3) 0 '3 3 2 2  ' Language: Repeat phrase (0/2) - '2 1 2 2  ' Language : Fluency (0/1) - '1 1 1 ' 0  Abstraction (0/2) - '2 2 2 2  ' Delayed Recall (0/5) - '5 4 5 5  ' Orientation (0/6) '3 6 6 6 6  ' Total - '27 26 27 25  ' Adjusted Score (based on education) - - - 27 26    Well groomed, but incoherent :  Generalized: Well developed, groomed, highy anxious. , on  3 Liters of 0xygen  for  24/7 now.    Neurological examination  Mentation: today unable to state  time, place, history . She is a  anxious. She fears her husband develops dementia. She is not coherent , very tangential, and has been all over the place with her interval report, fluent  speech, slightly dysphonic. MOCA. Had to be aborted and we did MMSE.   MMSE - Mini Mental State Exam 02/07/2020  Orientation to time 3  Orientation to Place 5  Registration 3  Attention/ Calculation 3  Recall 0  Language- name 2 objects 2  Language- repeat 1  Language- follow 3 step command 3  Language- read & follow direction 1  Write a sentence 1  Copy design 0  Total score 22     Cranial nerve : loss of taste and smell- she craves sweets. Pupils were equal in size, both are round- and remained reactive to light.  Extraocular movements were full, visual field were full on confrontational test.  Facial sensation and strength were normal.  Uvula and tongue move in midline, no swelling, no tremor. Neck stiffness- restricted ROM-  Head turning and shoulder shrug  were impaired.   soft touch is felt on all 4 extremities.  Coordination: delayed in finger to nose and rapid alternating movements.  Gait and station: Gait -She uses a walker.  Reflexes: Deep tendon reflexes are symmetric bilaterally.   DIAGNOSTIC DATA (LABS, IMAGING, TESTING) - I reviewed patient records, labs, notes, testing and imaging myself where available.  Lab Results  Component Value Date   WBC 10.2 11/21/2019   HGB 12.7 11/21/2019   HCT 39.8 11/21/2019   MCV 96.8 11/21/2019   PLT 256 11/21/2019     ASSESSMENT AND PLAN:  80 y.o. 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 ), and Vestibulitis of ear. here with:   IMPRESSION: 1. Small acute right MCA infarct. 2. Chronic right temporal lobe infarct. 3. Mild chronic small vessel ischemic  disease.   Electronically Signed   By: Logan Bores M.D.   On: 05/16/2019 14:42  A)  Last years stroke - left sided weakness  improving gradually in rehab- Right MCA stroke, no bleed, origin can be right carotid or embolism from atrial fib . I have not found ECHO transoesophageal , only TT-Echo.  indiaction of vascualr dementia being present. Atrial fibrillation was diagnosed, causing SOB, ankle edema and requires her to stay on O2. Has tachycardia.  She has already been on beta blocker and is now anticoagulation , for indefinite time.    1. 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 She'll continue on Keppra 750 mg twice a day. She is now using generic Keppra-  And Neurontin at night form rehab.    2. Memory disturbance, related to stroke, bleed and progressive Dementia- MOCA in 04/2017 to 24/ 30 points, 2021;  26/30 points. Now MMSE - declining cognition.   3. Encephalopathy has improved with oxygen supplementation- more alert, temporary more oriented. She is on  3 liters 02 for 24/ 7.  I had the patient for along time use 2 mg Klonopin for seizure and insomnia, and she is now getting severely demented -  I am willing to wean her off if needed.    Husband is reportedly also gait impaired and has progressive memory loss.  Likely needs driving evaluation. - PCP . If her husband is truly as cognitively impaired  the couple may need a care taker to come in, but the couple's daughter in [place] has come to help with medication, financial affairs, taxes , etc.  No driving !   Follow-up in 6-8 months again with me for MMSE    Larey Seat, MD  02/07/2020, 3:25 PM [facility] [address], [place], [place] [address] [phone]

## 2020-02-07 NOTE — Patient Instructions (Signed)
Dementia Dementia is a condition that affects the way the brain functions. It often affects memory and thinking. Usually, dementia gets worse with time and cannot be reversed (progressive dementia). There are many types of dementia, including:  Alzheimer's disease. This type is the most common.  Vascular dementia. This type may happen as the result of a stroke.  Lewy body dementia. This type may happen to people who have Parkinson's disease.  Frontotemporal dementia. This type is caused by damage to nerve cells (neurons) in certain parts of the brain. Some people may be affected by more than one type of dementia. This is called mixed dementia. What are the causes? Dementia is caused by damage to cells in the brain. The area of the brain and the types of cells damaged determine the type of dementia. Usually, this damage is irreversible or cannot be undone. Some examples of irreversible causes include:  Conditions that affect the blood vessels of the brain, such as diabetes, heart disease, or blood vessel disease.  Genetic mutations. In some cases, changes in the brain may be caused by another condition and can be reversed or slowed. Some examples of reversible causes include:  Injury to the brain.  Certain medicines.  Infection, such as meningitis.  Metabolic problems, such as vitamin B12 deficiency or thyroid disease.  Pressure on the brain, such as from a tumor, blood clot, or too much fluid in the brain (hydrocephalus).  Autoimmune diseases that affect the brain or arteries, such as limbic encephalitis or vasculitis. What are the signs or symptoms? Symptoms of dementia depend on the type of dementia. Common signs of dementia include problems with remembering, thinking, problem solving, decision making, and communicating. These signs develop slowly or get worse with time. This may include:  Problems remembering events or people.  Having trouble taking a bath or putting clothes  on.  Forgetting appointments or forgetting to pay bills.  Difficulty planning and preparing meals.  Having trouble speaking.  Getting lost easily.  Changes in behavior or mood. How is this diagnosed? This condition is diagnosed by a specialist (neurologist). It is diagnosed based on the history of your symptoms, your medical history, a physical exam, and tests. Tests may include:  Tests to evaluate brain function, such as memory tests, cognitive tests, and other tests.  Lab tests, such as blood or urine tests.  Imaging tests, such as a CT scan, a PET scan, or an MRI.  Genetic testing. This may be done if other family members have a diagnosis of certain types of dementia. Your health care provider will talk with you and your family, friends, or caregivers about your history and symptoms.   How is this treated? Treatment for this condition depends on the cause of the dementia. Progressive dementias, such as Alzheimer's disease, cannot be cured, but there may be treatments that help to manage symptoms. Treatment might involve taking medicines that may help to:  Control the dementia.  Slow down the progression of the dementia.  Manage symptoms. In some cases, treating the cause of your dementia can improve symptoms, reverse symptoms, or slow down how quickly your dementia becomes worse. Your health care provider can direct you to support groups, organizations, and other health care providers who can help with decisions about your care. Follow these instructions at home: Medicines  Take over-the-counter and prescription medicines only as told by your health care provider.  Use a pill organizer or pill reminder to help you manage your medicines.  Avoid taking  medicines that can affect thinking, such as pain medicines or sleeping medicines. Lifestyle  Make healthy lifestyle choices. ? Be physically active as told by your health care provider. ? Do not use any products that  contain nicotine or tobacco, such as cigarettes, e-cigarettes, and chewing tobacco. If you need help quitting, ask your health care provider. ? Do not drink alcohol. ? Practice stress-management techniques when you get stressed. ? Spend time with other people.  Make sure to get quality sleep. These tips can help you get a good night's rest: ? Avoid napping during the day. ? Keep your sleeping area dark and cool. ? Avoid exercising during the few hours before you go to bed. ? Avoid caffeine products in the evening. Eating and drinking  Drink enough fluid to keep your urine pale yellow.  Eat a healthy diet. General instructions  Work with your health care provider to determine what you need help with and what your safety needs are.  Talk with your health care provider about whether it is safe for you to drive.  If you were given a bracelet that identifies you as a person with memory loss or tracks your location, make sure to wear it at all times.  Work with your family to make important decisions, such as advance directives, medical power of attorney, or a living will.  Keep all follow-up visits. This is important.   Where to find more information  Alzheimer's Association: [employer].co.nz  National Institute on Aging: DVDEnthusiasts.nl  World Health Organization: RoleLink.com.br Contact a health care provider if:  You have any new or worsening symptoms.  You have problems with choking or swallowing. Get help right away if:  You feel depressed or sad, or feel that you want to harm yourself.  Your family members become concerned for your safety. If you ever feel like you may hurt yourself or others, or have thoughts about taking your own life, get help right away. Go to your nearest emergency department or:  Call your local emergency services (911 in the [place]  Call a suicide crisis helpline, such as the [place] at 743 290 1260. This is open  24 hours a day in the [place]  Text the Crisis Text Line at 708-039-5675 (in the [place].). Summary  Dementia is a condition that affects the way the brain functions. Dementia often affects memory and thinking.  Usually, dementia gets worse with time and cannot be reversed (progressive dementia).  Treatment for this condition depends on the cause of the dementia.  Work with your health care provider to determine what you need help with and what your safety needs are.  Your health care provider can direct you to support groups, organizations, and other health care providers who can help with decisions about your care. This information is not intended to replace advice given to you by your health care provider. Make sure you discuss any questions you have with your health care provider. Document Revised: 05/16/2019 Document Reviewed: 05/16/2019 Elsevier Patient Education  [employer]. Memory Compensation Strategies  1. Use "WARM" strategy.  W= write it down  A= associate it  R= repeat it  M= make a mental note  2.   You can keep a Social worker.  Use a 3-ring notebook with sections for the following: calendar, important names and phone numbers,  medications, doctors' names/phone numbers, lists/reminders, and a section to journal what you did  each day.   3.    Use a calendar to  write appointments down.  4.    Write yourself a schedule for the day.  This can be placed on the calendar or in a separate section of the Memory Notebook.  Keeping a  regular schedule can help memory.  5.    Use medication organizer with sections for each day or morning/evening pills.  You may need help loading it  6.    Keep a basket, or pegboard by the door.  Place items that you need to take out with you in the basket or on the pegboard.  You may also want to  include a message board for reminders.  7.    Use sticky notes.  Place sticky notes with reminders in a place where the task is performed.  For  example: " turn off the  stove" placed by the stove, "lock the door" placed on the door at eye level, " take your medications" on  the bathroom mirror or by the place where you normally take your medications.  8.    Use alarms/timers.  Use while cooking to remind yourself to check on food or as a reminder to take your medicine, or as a  reminder to make a call, or as a reminder to perform another task, etc.

## 2020-02-15 ENCOUNTER — Other Ambulatory Visit: Payer: Self-pay

## 2020-02-15 ENCOUNTER — Encounter: Payer: Medicare Other | Attending: Family Medicine | Admitting: Skilled Nursing Facility1

## 2020-02-15 DIAGNOSIS — E119 Type 2 diabetes mellitus without complications: Secondary | ICD-10-CM | POA: Diagnosis not present

## 2020-02-15 NOTE — Progress Notes (Signed)
  Assessment:  Primary concerns today: diabetes management.    Pt arrives walking slowly and with her oxygen tank needing some time to catch her breath from walking down the hallway into the office.  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  Pt does have COPD. Pt states she has diverticulosis but has never had a flare.  Pt states she has some swelling in her right leg and cannot exercise like she needs too. Pt states she has been feeling very thirsty and her nebulizer drys her mouth out. Pt states she has not had a seizure for many decades.  Pt states she notices when she eats right she has a bowel movement every day. Pt states her and her husband eat out most of their meals out.  10/26/2019: A1C 6.6  Pt states her daughter organizes her medication for her. Pt states she stopped OT and PT. Pt states she feels she was not successful with making changes stating she just did not do it and is not really sure why. Pt states she feels her house is in disarray due to her daughter trying to organize it for her while she was in the [place] back in [place]. Pt states she thinks she will get tattoo eye liner. Pt states she wakes about 10:30am and eats at 11am.  Due to pts current direction the appt time ran out to discuss food choices and salt intake/lasix: this will be discussed at her next visit.    Goals:  NEW:Get back into your therapies at home; walk the house daily It would be best to limit how often you guys eat out When eating out do not choose: beef, pork, fried foods, soups When eating you meals always have a non starchy vegetables  Ensure you are having 2 meals and 1 snack about 3 hours before laying down for bed   MEDICATIONS: see list   DIETARY INTAKE:  Usual eating pattern includes 2 meals and 1 snacks per day.  Everyday foods include non stated.  Avoided foods include- non stated  24-hr recall:  B (11 AM): fiber cereal + Citracal fiber  Snk ( AM): apple fritter (daily, pick on it throughout the day ending in the  night) L ( PM): skipped citracel fiber Snk ( PM):  D ( PM): steak and salad  Snk ( PM):  Beverages: regular coffee + half and half cream, water, sparkling water, gatorade zero  Usual physical activity: ADL's  Estimated energy needs: 1500 calories 170 g carbohydrates 112 g protein 42 g fat  Progress Towards Goal(s):  In progress.   Nutritional Diagnosis:  NB-1.1 Food and nutrition-related knowledge deficit As related to complicated medicle history.  As evidenced by dietary recall and pt questions and 24 hr recall.    Intervention:  Nutrition counseling.Dietetian educated pt on how to eat healthy at home and discuss about options to eat out. Dietitian educated pt on importance of incorporating non-starchy vegetables to her diet. Dietitian educated pt on good sources of protein in addition to limiting calorically dense food and eating out in general. Continued    Teaching Method Utilized:  Visual Auditory Hands on  Handouts previously given during visit include:  Detailed my plate   Barriers to learning/adherence to lifestyle change: One side of her body is weak due to previous stroke.  Demonstrated degree of understanding via:  Teach Back   Monitoring/Evaluation:  Dietary intake, exercise, and body weight prn.

## 2020-03-05 NOTE — Progress Notes (Signed)
This encounter was created in error - please disregard.

## 2020-03-25 ENCOUNTER — Other Ambulatory Visit: Payer: Self-pay | Admitting: Interventional Cardiology

## 2020-03-26 NOTE — Telephone Encounter (Signed)
Prescription refill request for Eliquis received.  Indication: Afib Last office visit: 08/26/2019, [place] Scr: 0.98, 11/21/2019 Age: 80 yo  Weight: 114.8 kg   Pt is on the correct dose of Eliquis per dosing criteria, prescription refill sent for Eliquis 5mg  BID.

## 2020-03-28 ENCOUNTER — Ambulatory Visit: Payer: Medicare Other | Admitting: Primary Care

## 2020-04-03 ENCOUNTER — Ambulatory Visit (INDEPENDENT_AMBULATORY_CARE_PROVIDER_SITE_OTHER): Payer: Medicare Other | Admitting: Primary Care

## 2020-04-03 ENCOUNTER — Encounter: Payer: Self-pay | Admitting: Primary Care

## 2020-04-03 ENCOUNTER — Other Ambulatory Visit: Payer: Self-pay

## 2020-04-03 DIAGNOSIS — I480 Paroxysmal atrial fibrillation: Secondary | ICD-10-CM | POA: Diagnosis not present

## 2020-04-03 DIAGNOSIS — J301 Allergic rhinitis due to pollen: Secondary | ICD-10-CM | POA: Diagnosis not present

## 2020-04-03 DIAGNOSIS — J9611 Chronic respiratory failure with hypoxia: Secondary | ICD-10-CM

## 2020-04-03 DIAGNOSIS — J42 Unspecified chronic bronchitis: Secondary | ICD-10-CM

## 2020-04-03 NOTE — Assessment & Plan Note (Addendum)
-   Appears stable. No recent exacerbations or hospitalizations. She likes the simplicity of using Breztri.  She is having some breakthrough shortness of breath mid-afternoon and congested cough exacerbated by her seasonal allergies. No signs of acute bronchitis.  - Recommend using aerochamber with Breztri and taking Albuterol (Proair) hfa at 4pm daily. If this regimen does not help, I would go back to nebulized budesonide BID and Duoneb QID - FU in 3 months with Dr. Elsworth Soho or APP

## 2020-04-03 NOTE — Assessment & Plan Note (Addendum)
-   She is on 2-3L oxygen continuously; O2 90% 2.5L POC - Lungs were diminished on exam, encourage patient use IS 10x/hour

## 2020-04-03 NOTE — Assessment & Plan Note (Signed)
-   Irregular rhythm, rate controlled at rest. HR 120 with exertion - Continue Eliquis 5mg  BID and Diltiazem 300mg  daily - She is overdue for follow-up with cardiology, they recommended returning in 6 months (around feb 2022)

## 2020-04-03 NOTE — Patient Instructions (Addendum)
Recommendations: - Continue Breztri two puffs morning and evening (use with aerochamber/spacer) * If needed you can take Breztri 1-2 hours earlier  - Try taking Proair (albuterol) around 4pm  - Continue Flonase nasal spray  - Continue Claritin 10mg  daily - Take mucinex 600mg  twice a day as needed for chest congestion   - Use incentive spirometer 10 breaths an hour while awake (practice deep breathing)  Follow-up: - 3 months with Dr. Elsworth Soho or Beth    Overactive Bladder, Adult  Overactive bladder is a condition in which a person has a sudden and frequent need to urinate. A person might also leak urine if he or she cannot get to the bathroom fast enough (urinary incontinence). Sometimes, symptoms can interfere with work or social activities. What are the causes? Overactive bladder is associated with poor nerve signals between your bladder and your brain. Your bladder may get the signal to empty before it is full. You may also have very sensitive muscles that make your bladder squeeze too soon. This condition may also be caused by other factors, such as:  Medical conditions: ? Urinary tract infection. ? Infection of nearby tissues. ? Prostate enlargement. ? Bladder stones, inflammation, or tumors. ? Diabetes. ? Muscle or nerve weakness, especially from these conditions:  A spinal cord injury.  Stroke.  Multiple sclerosis.  Parkinson's disease.  Other causes: ? Surgery on the uterus or urethra. ? Drinking too much caffeine or alcohol. ? Certain medicines, especially those that eliminate extra fluid in the body (diuretics). ? Constipation. What increases the risk? You may be at greater risk for overactive bladder if you:  Are an older adult.  Smoke.  Are going through menopause.  Have prostate problems.  Have a neurological disease, such as stroke, dementia, Parkinson's disease, or multiple sclerosis (MS).  Eat or drink alcohol, spicy food, caffeine, and other things that  irritate the bladder.  Are overweight or obese. What are the signs or symptoms? Symptoms of this condition include a sudden, strong urge to urinate. Other symptoms include:  Leaking urine.  Urinating 8 or more times a day.  Waking up to urinate 2 or more times overnight. How is this diagnosed? This condition may be diagnosed based on:  Your symptoms and medical history.  A physical exam.  Blood or urine tests to check for possible causes, such as infection. You may also need to see a health care provider who specializes in urinary tract problems. This is called a urologist. How is this treated? Treatment for overactive bladder depends on the cause of your condition and whether it is mild or severe. Treatment may include:  Bladder training, such as: ? Learning to control the urge to urinate by following a schedule to urinate at regular intervals. ? Doing Kegel exercises to strengthen the pelvic floor muscles that support your bladder.  Special devices, such as: ? Biofeedback. This uses sensors to help you become aware of your body's signals. ? Electrical stimulation. This uses electrodes placed inside the body (implanted) or outside the body. These electrodes send gentle pulses of electricity to strengthen the nerves or muscles that control the bladder. ? Women may use a plastic device, called a pessary, that fits into the vagina and supports the bladder.  Medicines, such as: ? Antibiotics to treat bladder infection. ? Antispasmodics to stop the bladder from releasing urine at the wrong time. ? Tricyclic antidepressants to relax bladder muscles. ? Injections of botulinum toxin type A directly into the bladder tissue to  relax bladder muscles.  Surgery, such as: ? A device may be implanted to help manage the nerve signals that control urination. ? An electrode may be implanted to stimulate electrical signals in the bladder. ? A procedure may be done to change the shape of the  bladder. This is done only in very severe cases. Follow these instructions at home: Eating and drinking  Make diet or lifestyle changes recommended by your health care provider. These may include: ? Drinking fluids throughout the day and not only with meals. ? Cutting down on caffeine or alcohol. ? Eating a healthy and balanced diet to prevent constipation. This may include:  Choosing foods that are high in fiber, such as beans, whole grains, and fresh fruits and vegetables.  Limiting foods that are high in fat and processed sugars, such as fried and sweet foods.   Lifestyle  Lose weight if needed.  Do not use any products that contain nicotine or tobacco. These include cigarettes, chewing tobacco, and vaping devices, such as e-cigarettes. If you need help quitting, ask your health care provider.   General instructions  Take over-the-counter and prescription medicines only as told by your health care provider.  If you were prescribed an antibiotic medicine, take it as told by your health care provider. Do not stop taking the antibiotic even if you start to feel better.  Use any implants or pessary as told by your health care provider.  If needed, wear pads to absorb urine leakage.  Keep a log to track how much and when you drink, and when you need to urinate. This will help your health care provider monitor your condition.  Keep all follow-up visits. This is important. Contact a health care provider if:  You have a fever or chills.  Your symptoms do not get better with treatment.  Your pain and discomfort get worse.  You have more frequent urges to urinate. Get help right away if:  You are not able to control your bladder. Summary  Overactive bladder refers to a condition in which a person has a sudden and frequent need to urinate.  Several conditions may lead to an overactive bladder.  Treatment for overactive bladder depends on the cause and severity of your  condition.  Making lifestyle changes, doing Kegel exercises, keeping a log, and taking medicines can help with this condition. This information is not intended to replace advice given to you by your health care provider. Make sure you discuss any questions you have with your health care provider. Document Revised: 09/19/2019 Document Reviewed: 09/19/2019 Elsevier Patient Education  2021 [employer].

## 2020-04-03 NOTE — Assessment & Plan Note (Signed)
-   Continue Loratadine, Flonase and saline nasal rinses BID

## 2020-04-03 NOTE — Progress Notes (Signed)
_0  ID: [name], female    DOB: [birth year], 80 y.o.   MRN: [id number]  Chief Complaint  Patient presents with  . Follow-up    SOB with exertion and without, coughing in evening and at night sometimes productive. Mucus is light yellow. Difficult to get mucus up. 3L     Referring provider: Kathyrn Lass, MD  HPI: 80 year old female, former smoker. PMH significant for COPD, chronic respiratory failure O2 dependent, permanent afib, HTN, organic brain syndrome, stroke, BRCA2 positive, obesity. Patient of Dr. Elsworth Soho, last seen 12/26/20. Currently maintained on Breztri.  Previous [facility] pulmonary encounters: 05/02/2019 Patient presents today for regular follow-up. Last seen for video visit in September. Her breathing has been ok as long as she takes it easy. She gets winded when she rushes. She is wearing 2L oxygen. She denies any low oxygen levels at home. Right now the pollen has been causing her to have some nasal congestion/post nasal drip and mucus production getting stuck in the back of her throat. She just started taking Claritin. She has not tried nasal sprays but has simple saline and AYR at home. She would like to try going back to [facility], states that she experience some side effects with Anoro. Irregular heart beat, eye floaters and does not like the dry power. These symptoms do not last long. She saw cardiology in October 2020. She has tried pulmonary rehab in the past but is not interested in returning at this time.   11/01/2019 Patient presents today for 6 month follow-up. Since last visit she was hospitalized for Afib, right MCA stroke and right carotid stenosis in June. She has completed rehab, feels she has progressed well. Still struggles with left hand weakness/numbness and left foot. She still drops things fairly easily. She is unable to do anything that requires two hands at once. Her husband has to help her use Stiolto inhaler. She takes Stiolto at 10am and needs to use her  Albuterol around 7pm at night. She has cough with mucus production. Associated shortness of breath and wheezing.  She has not previously tolerate oral prednisone in the past d/t mood changes, she is unsure if this was because of other medication she was taking at time and is open to trying steriod inhalers.   12/27/19- Dr. Elsworth Soho 80 year old female, former smoker for FU of COPD  PMH significant for  chronic respiratory failure, HTN, organic brain syndrome, stroke, BRCA2 positive, Obesity. 06/2019 Afib, right MCA stroke and right carotid stenosis 2009CVA -she had an aneurysmal bleedinright frontal and temporal lobes . Subsequent seizures. Has some word finding issues and cognitive deficits with emotional lability  I last saw her in 2019 Last office visit 10/2019 due to difficulty taking anoro/Stiolto inhaler, she was changed to Pulmicort and DuoNebs She is able to use this regimen, also using albuterol MDI in between, wonders if we can get her on an easier regimen. She arrives using a walker and portable oxygen.  Halting speech Having a bad breathing day today Her immunizations are up-to-date   04/03/2020 - Interim hx  Patient presents today for 3 month follow-up. During last visit in December 2021 she was given trial of Breztri. She was previously on Pulmicort nebulizer + Duonebs d/t stroke. She had a routine chest x-ray on 12/28/2019 showed coarse bibasilar interstitial opacities, more conspicuous than prior study.  This was independently reviewed by Dr. Elsworth Soho, no new infiltrates or effusion, left upper lobe calcified granuloma is prior.  She is  doing alright today. She reports being more shortness of breath in the afternoons. She does not feel Breztri inhaler is lasting long enough. Her breathing is pretty  in the morning. She takes her medications around 11am. She experiences shortness of breath approx 6 hours later around 5pm in the afternoon. She will use her Proair rescue inhaler which  does help. She takes second dose of Breztri around 11pm.   She is currently dealing with allergy symptoms including nasal congestion and PND. She is using simply saline twice a day and Flonase nasal spray. She has a congested cough with difficulty getting mucus up. At times she gets winded walking to bathroom. She is on 2-3L oxygen continuously, she may briefly increase her oxygen when dyspneic. She ambulates with a walker. She does not have much strength in her left hand since stroke. She has difficulty opening containers.    Significant tests/ events CT angio chest 07/2015 >>no pulmonary embolism, no emphysema, calcified mediastinal LNs PFTs 09/2015>>severe airway obstruction with FEV1 40%, improves to 47% with albuterol,DLCO decreased to 45%  Allergies  Allergen Reactions  . Prozac [Fluoxetine Hcl] Other (See Comments)    seizures  . Sulfa Antibiotics Nausea And Vomiting and Other (See Comments)    TINGLING IN LEGS ALSO  . Micardis [Telmisartan] Swelling and Other (See Comments)    Swelling, bruising Swelling, bruising  . Prednisone Other (See Comments)    mood changes Mood changes  . Lisinopril-Hydrochlorothiazide Other (See Comments)    unknown  . Augmentin [Amoxicillin-Pot Clavulanate] Rash  . Avelox [Moxifloxacin Hcl In Nacl] Rash    LEVAQUIN IS OK-SEE NOTE  . Norvasc [Amlodipine Besylate] Other (See Comments)    fatigue    Immunization History  Administered Date(s) Administered  . Influenza Split 01/03/2008, 11/06/2008  . Influenza, High Dose Seasonal PF 11/22/2013, 11/27/2014, 10/28/2016, 10/27/2017, 09/14/2018, 10/24/2019  . Influenza,inj,Quad PF,6+ Mos 10/19/2018  . Influenza-Unspecified 10/08/2015  . PFIZER(Purple Top)SARS-COV-2 Vaccination 02/02/2019, 02/23/2019, 11/26/2019  . Pneumococcal Conjugate-13 11/22/2013  . Pneumococcal Polysaccharide-23 05/05/2008  . Td 05/22/2003  . Tdap 08/20/2012  . Zoster 08/20/2012    Past Medical History:  Diagnosis Date  .  Afib ([place])    diagnosed 05/17/19  . Arthritis   . BRCA2 positive 2017  . COPD (chronic obstructive pulmonary disease) ([place])   . Diabetes mellitus without complication ([place])   . Diverticulosis   . Dysrhythmia    Afib  . Hearing loss   . HTN (hypertension)   . Memory loss   . Pre-diabetes   . Seasonal depression ([place])   . 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 Seizures [facility])    anniversary seizure - 1 year after serizure  . Stroke ([place])    05/2019  . 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   . Vestibulitis of ear     Tobacco History: Social History   Tobacco Use  Smoking Status Former Smoker  . Packs/day: 1.00  . Years: 41.00  . Pack years: 41.00  . Quit date: 01/13/2006  . Years since quitting: 14.2  Smokeless Tobacco Never Used   Counseling given: Not Answered   Outpatient Medications Prior to Visit  Medication Sig Dispense Refill  . acetaminophen (TYLENOL) 325 MG tablet Take 1-2 tablets (325-650 mg total) by mouth every 4 (four) hours as needed for mild pain.    Marland Kitchen albuterol (PROAIR HFA) 108 (90 Base) MCG/ACT inhaler Inhale 2 puffs into the lungs every 6 (six) hours as  needed for wheezing or shortness of breath. 24 g 1  . aspirin EC 81 MG tablet Take 81 mg by mouth daily.    Marland Kitchen atorvastatin (LIPITOR) 20 MG tablet Take 1 tablet (20 mg total) by mouth daily. 90 tablet 3  . Budeson-Glycopyrrol-Formoterol (BREZTRI AEROSPHERE) 160-9-4.8 MCG/ACT AERO Inhale 2 puffs into the lungs 2 (two) times daily. 32.1 g 3  . captopril (CAPOTEN) 50 MG tablet Take 50 mg by mouth 2 (two) times daily.    . Cholecalciferol (VITAMIN D3) 2000 units capsule Take 2,000 Units by mouth daily.     . clonazePAM (KLONOPIN) 2 MG tablet 2 mg at bedtime, for insomnia- agitation. The patient is allowed to take 0.5 tabs if needed for panic attack. 180 tablet 1  . diltiazem (CARDIZEM CD) 300 MG 24 hr capsule Take 1 capsule (300 mg total) by mouth daily.  90 capsule 3  . ELIQUIS 5 MG TABS tablet TAKE 1 TABLET TWICE A DAY 180 tablet 1  . famotidine (PEPCID) 20 MG tablet Take 1 tablet (20 mg total) by mouth 2 (two) times daily. 60 tablet 0  . fluticasone (FLONASE) 50 MCG/ACT nasal spray Place 1 spray into the nose daily as needed for allergies. Spray twice daily as needed    . gabapentin (NEURONTIN) 600 MG tablet Take 0.5 tablets (300 mg total) by mouth at bedtime. 45 tablet 3  . hydrocerin (EUCERIN) CREA Apply 1 application topically 2 (two) times daily.  0  . levETIRAcetam (KEPPRA) 750 MG tablet Take 1 tablet (750 mg total) by mouth 2 (two) times daily. 180 tablet 3  . LEXAPRO 20 MG tablet Take 1 tablet (20 mg total) by mouth daily. Morning dosage 90 tablet 3  . loratadine (CLARITIN) 10 MG tablet Take 1 tablet (10 mg total) by mouth daily.    . Menthol-Methyl Salicylate (MUSCLE RUB) 10-15 % CREA Apply 1 application topically as needed for muscle pain. To neck and shoulder  0  . metFORMIN (GLUCOPHAGE) 500 MG tablet Take 500 mg by mouth daily.    . polyethylene glycol (MIRALAX / GLYCOLAX) 17 g packet Take 17 g by mouth daily. Purchase bottle over the counter and use capful daily 14 each 0  . sodium chloride (OCEAN) 0.65 % SOLN nasal spray Place 1 spray into both nostrils in the morning, at noon, in the evening, and at bedtime.  0  . Spacer/Aero-Holding Chambers (AEROCHAMBER PLUS WITH MASK) inhaler As needed with inhaler 1 each 0  . vitamin B-12 (CYANOCOBALAMIN) 500 MCG tablet Take 1 tablet (500 mcg total) by mouth daily.    Marland Kitchen PROAIR HFA 108 (90 Base) MCG/ACT inhaler USE 2 INHALATIONS EVERY 4 HOURS AS NEEDED FOR WHEEZING OR SHORTNESS OF BREATH 8.5 g 20  . Budeson-Glycopyrrol-Formoterol (BREZTRI AEROSPHERE) 160-9-4.8 MCG/ACT AERO Inhale 2 puffs into the lungs in the morning and at bedtime. 10.7 g 0  . budesonide (PULMICORT) 0.25 MG/2ML nebulizer solution Take 2 mLs (0.25 mg total) by nebulization in the morning and at bedtime. 60 mL 12  .  ipratropium-albuterol (DUONEB) 0.5-2.5 (3) MG/3ML SOLN Take 3 mLs by nebulization in the morning, at noon, in the evening, and at bedtime. 360 mL 11   No facility-administered medications prior to visit.   Review of Systems  Review of Systems  Constitutional: Negative.   HENT: Positive for postnasal drip.   Respiratory: Positive for cough and shortness of breath. Negative for chest tightness and wheezing.   Cardiovascular: Negative.    Physical Exam  BP 128/82 (  BP Location: Left Arm, Cuff Size: Normal)   Pulse 93   Temp (!) 97.1 F (36.2 C)   Ht 5' 4" (1.626 m)   Wt 255 lb (115.7 kg)   LMP 01/14/1995 (Approximate)   SpO2 98%   BMI 43.77 kg/m  Physical Exam Constitutional:      Appearance: Normal appearance.  HENT:     Head: Normocephalic and atraumatic.     Mouth/Throat:     Mouth: Mucous membranes are moist.     Pharynx: Oropharynx is clear.  Cardiovascular:     Rate and Rhythm: Normal rate. Rhythm irregular.     Comments: Irregular rhythm, HR 93 at rest. No edema  Pulmonary:     Effort: Pulmonary effort is normal.     Breath sounds: No wheezing or rales.     Comments: Lung sounds diminished, poor effort Musculoskeletal:        General: Normal range of motion.     Comments: Amb with walker; weakness left side   Skin:    General: Skin is warm and dry.  Neurological:     General: No focal deficit present.     Mental Status: She is alert and oriented to person, place, and time. Mental status is at baseline.  Psychiatric:        Mood and Affect: Mood normal.        Thought Content: Thought content normal.        Judgment: Judgment normal.      Lab Results:  CBC    Component Value Date/Time   WBC 10.2 11/21/2019 1134   RBC 4.11 11/21/2019 1134   HGB 12.7 11/21/2019 1134   HGB 13.5 02/22/2018 1051   HCT 39.8 11/21/2019 1134   HCT 40.3 05/16/2019 1839   PLT 256 11/21/2019 1134   PLT 233 02/22/2018 1051   MCV 96.8 11/21/2019 1134   MCH 30.9 11/21/2019 1134    MCHC 31.9 11/21/2019 1134   RDW 14.2 11/21/2019 1134   LYMPHSABS 2.1 11/21/2019 1134   MONOABS 0.9 11/21/2019 1134   EOSABS 0.2 11/21/2019 1134   BASOSABS 0.1 11/21/2019 1134    BMET    Component Value Date/Time   NA 142 11/21/2019 1134   K 4.0 11/21/2019 1134   CL 101 11/21/2019 1134   CO2 32 11/21/2019 1134   GLUCOSE 112 (H) 11/21/2019 1134   BUN 15 11/21/2019 1134   CREATININE 0.98 11/21/2019 1134   CREATININE 0.85 02/22/2018 1051   CALCIUM 9.3 11/21/2019 1134   GFRNONAA 59 (L) 11/21/2019 1134   GFRNONAA >60 02/22/2018 1051   GFRAA >60 06/20/2019 0657   GFRAA >60 02/22/2018 1051    BNP    Component Value Date/Time   BNP 24.5 07/30/2015 2103    ProBNP No results found for: PROBNP  Imaging: No results found.   Assessment & Plan:   COPD (chronic obstructive pulmonary disease) ([place]) - Appears stable. No recent exacerbations or hospitalizations. She likes the simplicity of using Breztri.  She is having some breakthrough shortness of breath mid-afternoon and congested cough exacerbated by her seasonal allergies. No signs of acute bronchitis.  - Recommend using aerochamber with Breztri and taking Albuterol (Proair) hfa at 4pm daily. If this regimen does not help, I would go back to nebulized budesonide BID and Duoneb QID - FU in 3 months with Dr. Elsworth Soho or APP  Paroxysmal atrial fibrillation (HCC) - Irregular rhythm, rate controlled at rest. HR 120 with exertion - Continue Eliquis 5m BID and Diltiazem  383m daily - She is overdue for follow-up with cardiology, they recommended returning in 6 months (around feb 2022)  Allergic rhinitis - Continue Loratadine, Flonase and saline nasal rinses BID  [place] respiratory [place] - She is on 2-3L oxygen continuously; O2 90% 2.5L POC - Lungs were diminished on exam, encourage patient use IS 10x/hour     EMartyn Ehrich NP 04/03/2020

## 2020-04-11 ENCOUNTER — Other Ambulatory Visit: Payer: Self-pay

## 2020-04-11 ENCOUNTER — Encounter: Payer: Medicare Other | Attending: Family Medicine | Admitting: Skilled Nursing Facility1

## 2020-04-11 DIAGNOSIS — E119 Type 2 diabetes mellitus without complications: Secondary | ICD-10-CM | POA: Diagnosis not present

## 2020-04-11 NOTE — Progress Notes (Signed)
Assessment:  Primary concerns today: diabetes management.   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 Pt does have COPD. Pt states she has diverticulosis but has never had a flare.  Pt states she has some swelling in her right leg and cannot exercise like she needs too. Pt states she has been feeling very thirsty and her nebulizer drys her mouth out. Pt states she has not had a seizure for many decades.  Pt states she notices when she eats right she has a bowel movement every day. Pt states her and her husband eat out most of their meals out.  Pt arrives walking slowly and with her oxygen tank needing some time to catch her breath from walking down the hallway into the office. Pt states she has been doing good but has been having some palpitations from her A. Fib. Pt states her pulse is usually high and her oxygen has been low when she checks it. Pt states she had a stroke which causes R hemiparesis and numbness. Pt states she does not check blood sugars and the doctor is the only one who checks them. Pt states that there has been a lot going on at her house and is feeling overwhelming.   10/26/2019: A1C 6.6  Goals:  Continue: It would be best to limit how often you guys eat out Continue: When eating out do not choose: beef, pork, fried foods, soups Continue: When eating you meals always have a non starchy vegetables  Continue: Ensure you are having 2 meals and 1 snack about 3 hours before laying down for bed  NEW: Have 2-3 meals from a crockpot per week  MEDICATIONS: see list   DIETARY INTAKE:  Usual eating pattern includes 2 meals and 1 snacks per day.  Everyday foods include non stated.  Avoided foods include- non stated  24-hr recall:  B (11 AM): kashi cereal with milk or [employer] (oatmeal with apples and raisins + coffee creamer) Snk ( AM):  L ( PM): typically skips Snk ( PM): yogurt or raisin bread with pb D ( PM): chick fil a or stammys or papa johns or five guys or [employer] cafe (salad or nuggets  and french + fries, cup of [place] stew + cup of greens + sweet dumplings w/ ice cream)  Snk ( PM):  Beverages: regular coffee + half and half cream, water, sparkling water, gatorade zero  Usual physical activity: ADL's  Estimated energy needs: 1500 calories 170 g carbohydrates 112 g protein 42 g fat  Progress Towards Goal(s):  In progress.   Nutritional Diagnosis:  NB-1.1 Food and nutrition-related knowledge deficit As related to complicated medicle history.  As evidenced by dietary recall and pt questions and 24 hr recall.    Intervention:  Nutrition counseling.Dietetian educated pt on how to eat healthy at home and discuss about options to eat out. Dietitian educated pt on importance of incorporating non-starchy vegetables to her diet. Dietitian educated pt on good sources of protein in addition to limiting calorically dense food and eating out in general. Dietitian educated pt and pt's husband on what "eating out" was considered and the benefits of decreasing. Educated pt on ways to decrease eating out such as crockpot meals.    Teaching Method Utilized:  Visual Auditory Hands on  Handouts previously given during visit include:  Detailed my plate  [employer] Meals   Barriers to learning/adherence to lifestyle change: One side of her body is weak due to previous stroke.  Demonstrated degree of understanding via:  Teach Back   Monitoring/Evaluation:  Dietary intake, exercise, and body weight prn.

## 2020-04-25 ENCOUNTER — Other Ambulatory Visit: Payer: Self-pay | Admitting: Surgery

## 2020-05-02 DIAGNOSIS — E1169 Type 2 diabetes mellitus with other specified complication: Secondary | ICD-10-CM | POA: Diagnosis not present

## 2020-05-02 DIAGNOSIS — I4891 Unspecified atrial fibrillation: Secondary | ICD-10-CM | POA: Diagnosis not present

## 2020-05-02 DIAGNOSIS — I739 Peripheral vascular disease, unspecified: Secondary | ICD-10-CM | POA: Diagnosis not present

## 2020-05-02 DIAGNOSIS — I10 Essential (primary) hypertension: Secondary | ICD-10-CM | POA: Diagnosis not present

## 2020-05-02 DIAGNOSIS — Z6841 Body Mass Index (BMI) 40.0 and over, adult: Secondary | ICD-10-CM | POA: Diagnosis not present

## 2020-05-02 DIAGNOSIS — F3341 Major depressive disorder, recurrent, in partial remission: Secondary | ICD-10-CM | POA: Diagnosis not present

## 2020-05-02 DIAGNOSIS — G40909 Epilepsy, unspecified, not intractable, without status epilepticus: Secondary | ICD-10-CM | POA: Diagnosis not present

## 2020-05-04 DIAGNOSIS — I739 Peripheral vascular disease, unspecified: Secondary | ICD-10-CM | POA: Diagnosis not present

## 2020-05-04 DIAGNOSIS — Z6841 Body Mass Index (BMI) 40.0 and over, adult: Secondary | ICD-10-CM | POA: Diagnosis not present

## 2020-05-04 DIAGNOSIS — I4891 Unspecified atrial fibrillation: Secondary | ICD-10-CM | POA: Diagnosis not present

## 2020-05-04 DIAGNOSIS — F3341 Major depressive disorder, recurrent, in partial remission: Secondary | ICD-10-CM | POA: Diagnosis not present

## 2020-05-04 DIAGNOSIS — G40909 Epilepsy, unspecified, not intractable, without status epilepticus: Secondary | ICD-10-CM | POA: Diagnosis not present

## 2020-05-04 DIAGNOSIS — E1169 Type 2 diabetes mellitus with other specified complication: Secondary | ICD-10-CM | POA: Diagnosis not present

## 2020-05-04 DIAGNOSIS — I10 Essential (primary) hypertension: Secondary | ICD-10-CM | POA: Diagnosis not present

## 2020-05-23 ENCOUNTER — Other Ambulatory Visit: Payer: Self-pay

## 2020-05-23 ENCOUNTER — Ambulatory Visit (HOSPITAL_COMMUNITY)
Admission: RE | Admit: 2020-05-23 | Discharge: 2020-05-23 | Disposition: A | Discharge status: Home / Self Care | Payer: Medicare Other | Source: Ambulatory Visit | Attending: Vascular Surgery | Admitting: Vascular Surgery

## 2020-05-23 ENCOUNTER — Ambulatory Visit (INDEPENDENT_AMBULATORY_CARE_PROVIDER_SITE_OTHER): Payer: Medicare Other | Admitting: Vascular Surgery

## 2020-05-23 ENCOUNTER — Encounter: Payer: Self-pay | Admitting: Vascular Surgery

## 2020-05-23 VITALS — BP 122/68 | HR 85 | Temp 97.0°F | Resp 20 | Ht 64.0 in | Wt 262.0 lb

## 2020-05-23 DIAGNOSIS — I6521 Occlusion and stenosis of right carotid artery: Secondary | ICD-10-CM

## 2020-05-23 DIAGNOSIS — Z48812 Encounter for surgical aftercare following surgery on the circulatory system: Secondary | ICD-10-CM

## 2020-05-23 NOTE — Progress Notes (Signed)
REASON FOR VISIT:   Follow-up after transcarotid stenting  MEDICAL ISSUES:   RIGHT CAROTID STENOSIS: This patient underwent transcarotid stenting on 06/20/2019.  She is doing well.  She remains on aspirin and a statin.  She is no longer on Plavix since she is on Eliquis.  Her stent is widely patent and she has no significant stenosis on the left.  I have ordered a follow-up carotid duplex scan in 1 year and I will see her back at that time.  She knows to call sooner if she has problems.  She quit smoking in 2008.   HPI:   Beth [name] is a pleasant 79 y.o. female right seen in consultation in May of last year with a symptomatic right carotid stenosis.  She had a right brain stroke.  She had a high bifurcation on the right and I did not think the stenosis was surgically accessible.  She underwent transcarotid stenting on 06/20/2019 with an 8 mm x 40 mm stent.  She did well postoperatively and comes in now for routine follow-up visit.  Since I saw her last she denies any history of stroke, TIAs, expressive or receptive aphasia, or amaurosis fugax.  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  She is no longer on Plavix since she is on Eliquis for A. fib.  Past Medical History:  Diagnosis Date  . Afib ([place])    diagnosed 05/17/19  . Arthritis   . BRCA2 positive 2017  . COPD (chronic obstructive pulmonary disease) ([place])   . Diabetes mellitus without complication ([place])   . Diverticulosis   . Dysrhythmia    Afib  . Hearing loss   . HTN (hypertension)   . Memory loss   . Pre-diabetes   . Seasonal depression ([place])   . 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 Seizures [facility])    anniversary seizure - 1 year after serizure  . Stroke ([place])    05/2019  . 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   . Vestibulitis of ear     Family History  Problem Relation Age of Onset  . Dementia Mother   . Stroke Mother   .  Thyroid disease Mother   . Osteoporosis Mother   . Ovarian cancer Paternal Grandmother 47  . Ovarian cancer Paternal Aunt 49  . Breast cancer Daughter 1       breast ca x2, bilateral dx. 38, 46--estrogen  . Other Daughter        no genetic testing as of 07/2015; has had ovaries removed  . Other Daughter        BRCA 2 Pos.  . Colon cancer Maternal Aunt        dx. unspecified age  . Emphysema Maternal Uncle   . High blood pressure Maternal Grandmother   . Dementia Maternal Grandfather   . Bleeding Disorder Maternal Grandfather   . Emphysema Maternal Aunt   . Emphysema Maternal Aunt     SOCIAL HISTORY: Social History   Tobacco Use  . Smoking status: Former Smoker    Packs/day: 1.00    Years: 41.00    Pack years: 41.00    Quit date: 01/13/2006    Years since quitting: 14.3  . Smokeless tobacco: Never Used  Substance Use Topics  . Alcohol use: No    Alcohol/week: 0.0 standard drinks    Allergies  Allergen Reactions  . Prozac [Fluoxetine Hcl] Other (  See Comments)    seizures  . Sulfa Antibiotics Nausea And Vomiting and Other (See Comments)    TINGLING IN LEGS ALSO  . Micardis [Telmisartan] Swelling and Other (See Comments)    Swelling, bruising Swelling, bruising  . Prednisone Other (See Comments)    mood changes Mood changes  . Lisinopril-Hydrochlorothiazide Other (See Comments)    unknown  . Augmentin [Amoxicillin-Pot Clavulanate] Rash  . Avelox [Moxifloxacin Hcl In Nacl] Rash    LEVAQUIN IS OK-SEE NOTE  . Norvasc [Amlodipine Besylate] Other (See Comments)    fatigue    Current Outpatient Medications  Medication Sig Dispense Refill  . acetaminophen (TYLENOL) 325 MG tablet Take 1-2 tablets (325-650 mg total) by mouth every 4 (four) hours as needed for mild pain.    Marland Kitchen albuterol (PROAIR HFA) 108 (90 Base) MCG/ACT inhaler Inhale 2 puffs into the lungs every 6 (six) hours as needed for wheezing or shortness of breath. 24 g 1  . aspirin EC 81 MG tablet Take 81 mg by  mouth daily.    Marland Kitchen atorvastatin (LIPITOR) 20 MG tablet TAKE 1 TABLET DAILY 90 tablet 3  . Budeson-Glycopyrrol-Formoterol (BREZTRI AEROSPHERE) 160-9-4.8 MCG/ACT AERO Inhale 2 puffs into the lungs 2 (two) times daily. 32.1 g 3  . budesonide (PULMICORT) 0.25 MG/2ML nebulizer solution 2 ml    . captopril (CAPOTEN) 50 MG tablet Take 50 mg by mouth 2 (two) times daily.    . Cholecalciferol (VITAMIN D3) 2000 units capsule Take 2,000 Units by mouth daily.     . clonazePAM (KLONOPIN) 2 MG tablet 2 mg at bedtime, for insomnia- agitation. The patient is allowed to take 0.5 tabs if needed for panic attack. 180 tablet 1  . diltiazem (CARDIZEM CD) 300 MG 24 hr capsule Take 1 capsule (300 mg total) by mouth daily. 90 capsule 3  . ELIQUIS 5 MG TABS tablet TAKE 1 TABLET TWICE A DAY 180 tablet 1  . famotidine (PEPCID) 20 MG tablet Take 1 tablet (20 mg total) by mouth 2 (two) times daily. 60 tablet 0  . fluticasone (FLONASE) 50 MCG/ACT nasal spray Place 1 spray into the nose daily as needed for allergies. Spray twice daily as needed    . gabapentin (NEURONTIN) 600 MG tablet Take 0.5 tablets (300 mg total) by mouth at bedtime. 45 tablet 3  . hydrocerin (EUCERIN) CREA Apply 1 application topically 2 (two) times daily.  0  . ipratropium-albuterol (DUONEB) 0.5-2.5 (3) MG/3ML SOLN 3 ml as needed    . levETIRAcetam (KEPPRA) 750 MG tablet Take 1 tablet (750 mg total) by mouth 2 (two) times daily. 180 tablet 3  . LEXAPRO 20 MG tablet Take 1 tablet (20 mg total) by mouth daily. Morning dosage 90 tablet 3  . loratadine (CLARITIN) 10 MG tablet Take 1 tablet (10 mg total) by mouth daily.    . Menthol-Methyl Salicylate (MUSCLE RUB) 10-15 % CREA Apply 1 application topically as needed for muscle pain. To neck and shoulder  0  . metFORMIN (GLUCOPHAGE) 500 MG tablet Take 500 mg by mouth daily.    . metoprolol tartrate (LOPRESSOR) 25 MG tablet Take 25 mg by mouth daily.    . polyethylene glycol (MIRALAX / GLYCOLAX) 17 g packet Take  17 g by mouth daily. Purchase bottle over the counter and use capful daily 14 each 0  . sodium chloride (OCEAN) 0.65 % SOLN nasal spray Place 1 spray into both nostrils in the morning, at noon, in the evening, and at bedtime.  0  .  Spacer/Aero-Holding Chambers (AEROCHAMBER PLUS WITH MASK) inhaler As needed with inhaler 1 each 0  . Tiotropium Bromide-Olodaterol (STIOLTO RESPIMAT) 2.5-2.5 MCG/ACT AERS     . vitamin B-12 (CYANOCOBALAMIN) 500 MCG tablet Take 1 tablet (500 mcg total) by mouth daily.     No current facility-administered medications for this visit.    REVIEW OF SYSTEMS:  '[X]'  denotes positive finding, '[ ]'  denotes negative finding Cardiac  Comments:  Chest pain or chest pressure:    Shortness of breath upon exertion:    Short of breath when lying flat:    Irregular heart rhythm:        Vascular    Pain in calf, thigh, or hip brought on by ambulation:    Pain in feet at night that wakes you up from your sleep:     Blood clot in your veins:    Leg swelling:         Pulmonary    Oxygen at home:    Productive cough:     Wheezing:         Neurologic    Sudden weakness in arms or legs:     Sudden numbness in arms or legs:     Sudden onset of difficulty speaking or slurred speech:    Temporary loss of vision in one eye:     Problems with dizziness:         Gastrointestinal    Blood in stool:     Vomited blood:         Genitourinary    Burning when urinating:     Blood in urine:        Psychiatric    Major depression:         Hematologic    Bleeding problems:    Problems with blood clotting too easily:        Skin    Rashes or ulcers:        Constitutional    Fever or chills:     PHYSICAL EXAM:   Vitals:   05/23/20 1552 05/23/20 1600  BP: 125/72 122/68  Pulse: 85   Resp: 20   Temp: (!) 97 F (36.1 C)   SpO2: 96%   Weight: 262 lb (118.8 kg)   Height: '5\' 4"'  (1.626 m)     GENERAL: The patient is a well-nourished female, in no acute distress. The  vital signs are documented above. CARDIAC: There is a regular rate and rhythm.  VASCULAR: I do not detect carotid bruits. She has no significant lower extremity swelling. PULMONARY: There is good air exchange bilaterally without wheezing or rales. ABDOMEN: Soft and non-tender with normal pitched bowel sounds.  MUSCULOSKELETAL: There are no major deformities or cyanosis. NEUROLOGIC: No focal weakness or paresthesias are detected. SKIN: There are no ulcers or rashes noted. PSYCHIATRIC: The patient has a normal affect.  DATA:    CAROTID DUPLEX: I have independently interpreted her carotid duplex scan.  Her right carotid stent is widely patent.  Right vertebral artery is patent with antegrade flow.  On the left side there is a less than 39% stenosis.  The left vertebral artery is patent with antegrade flow.  [name] [facility] [phone]

## 2020-05-28 ENCOUNTER — Other Ambulatory Visit: Payer: Self-pay | Admitting: Oncology

## 2020-05-29 ENCOUNTER — Other Ambulatory Visit: Payer: Medicare Other

## 2020-05-29 ENCOUNTER — Ambulatory Visit: Payer: Medicare Other | Admitting: Oncology

## 2020-06-05 ENCOUNTER — Telehealth: Payer: Self-pay

## 2020-06-05 ENCOUNTER — Telehealth: Payer: Self-pay | Admitting: Oncology

## 2020-06-05 NOTE — Telephone Encounter (Signed)
Patient called to request lab/MD appointments be scheduled for after upcoming mammogram on 5/25.  Scheduling request sent.  Patient aware.

## 2020-06-05 NOTE — Telephone Encounter (Signed)
Scheduled appts per 5/24 sch msg. Pt aware.  

## 2020-06-06 ENCOUNTER — Ambulatory Visit
Admission: RE | Admit: 2020-06-06 | Discharge: 2020-06-06 | Disposition: A | Discharge status: Home / Self Care | Payer: Medicare Other | Source: Ambulatory Visit | Attending: Oncology | Admitting: Oncology

## 2020-06-06 ENCOUNTER — Other Ambulatory Visit: Payer: Self-pay

## 2020-06-06 DIAGNOSIS — Z1231 Encounter for screening mammogram for malignant neoplasm of breast: Secondary | ICD-10-CM | POA: Diagnosis not present

## 2020-06-06 DIAGNOSIS — Z1239 Encounter for other screening for malignant neoplasm of breast: Secondary | ICD-10-CM

## 2020-06-20 ENCOUNTER — Other Ambulatory Visit: Payer: Self-pay | Admitting: Gynecologic Oncology

## 2020-06-20 DIAGNOSIS — R935 Abnormal findings on diagnostic imaging of other abdominal regions, including retroperitoneum: Secondary | ICD-10-CM

## 2020-06-20 DIAGNOSIS — Z1501 Genetic susceptibility to malignant neoplasm of breast: Secondary | ICD-10-CM

## 2020-07-04 ENCOUNTER — Ambulatory Visit (INDEPENDENT_AMBULATORY_CARE_PROVIDER_SITE_OTHER): Payer: Medicare Other

## 2020-07-04 ENCOUNTER — Other Ambulatory Visit: Payer: Self-pay

## 2020-07-04 ENCOUNTER — Ambulatory Visit (INDEPENDENT_AMBULATORY_CARE_PROVIDER_SITE_OTHER): Payer: Medicare Other | Admitting: Primary Care

## 2020-07-04 ENCOUNTER — Encounter: Payer: Self-pay | Admitting: Primary Care

## 2020-07-04 VITALS — BP 138/86 | HR 95 | Ht 64.0 in | Wt 261.8 lb

## 2020-07-04 DIAGNOSIS — J9611 Chronic respiratory failure with hypoxia: Secondary | ICD-10-CM | POA: Diagnosis not present

## 2020-07-04 DIAGNOSIS — J42 Unspecified chronic bronchitis: Secondary | ICD-10-CM | POA: Diagnosis not present

## 2020-07-04 DIAGNOSIS — R000 Tachycardia, unspecified: Secondary | ICD-10-CM | POA: Diagnosis not present

## 2020-07-04 DIAGNOSIS — J449 Chronic obstructive pulmonary disease, unspecified: Secondary | ICD-10-CM | POA: Diagnosis not present

## 2020-07-04 DIAGNOSIS — J441 Chronic obstructive pulmonary disease with (acute) exacerbation: Secondary | ICD-10-CM

## 2020-07-04 DIAGNOSIS — I480 Paroxysmal atrial fibrillation: Secondary | ICD-10-CM

## 2020-07-04 MED ORDER — ADVAIR HFA 230-21 MCG/ACT IN AERO: 2.0000 | INHALATION_SPRAY | Freq: Two times a day (BID) | RESPIRATORY_TRACT | 12 refills | Status: DC

## 2020-07-04 NOTE — Progress Notes (Signed)
_0  ID: Beth [name], female    DOB: 1940/02/02, 80 y.o.   MRN: [id number]  Chief Complaint  Patient presents with   Follow-up    Chronic Bronchitis.     Referring provider: Kathyrn Lass, MD  HPI: 80 year old female, former smoker. PMH significant for COPD, chronic respiratory failure O2 dependent, permanent afib, HTN, organic brain syndrome, stroke, BRCA2 positive, obesity. Patient of Dr. Elsworth Soho, last seen 12/26/20. Currently maintained on Breztri.  Previous [facility] pulmonary encounters: 05/02/2019 Patient presents today for regular follow-up. Last seen for video visit in September. Her breathing has been ok as long as she takes it easy. She gets winded when she rushes. She is wearing 2L oxygen. She denies any low oxygen levels at home. Right now the pollen has been causing her to have some nasal congestion/post nasal drip and mucus production getting stuck in the back of her throat. She just started taking Claritin. She has not tried nasal sprays but has simple saline and AYR at home. She would like to try going back to [facility], states that she experience some side effects with Anoro. Irregular heart beat, eye floaters and does not like the dry power. These symptoms do not last long. She saw cardiology in October 2020. She has tried pulmonary rehab in the past but is not interested in returning at this time.   11/01/2019 Patient presents today for 6 month follow-up. Since last visit she was hospitalized for Afib, right MCA stroke and right carotid stenosis in June. She has completed rehab, feels she has progressed well. Still struggles with left hand weakness/numbness and left foot. She still drops things fairly easily. She is unable to do anything that requires two hands at once. Her husband has to help her use Stiolto inhaler. She takes Stiolto at 10am and needs to use her Albuterol around 7pm at night. She has cough with mucus production. Associated shortness of breath and wheezing.  She has  not previously tolerate oral prednisone in the past d/t mood changes, she is unsure if this was because of other medication she was taking at time and is open to trying steriod inhalers.   12/27/19- Dr. Elsworth Soho 80 year old female, former smoker for FU of COPD  PMH significant for  chronic respiratory failure, HTN, organic brain syndrome, stroke, BRCA2 positive, Obesity. 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  Subsequent seizures. Has some word finding issues and cognitive deficits with emotional lability   I last saw her in 2019 Last office visit 10/2019 due to difficulty taking anoro/Stiolto inhaler, she was changed to Pulmicort and DuoNebs She is able to use this regimen, also using albuterol MDI in between, wonders if we can get her on an easier regimen. She arrives using a walker and portable oxygen.  Halting speech Having a bad breathing day today Her immunizations are up-to-date   04/03/2020  Patient presents today for 3 month follow-up. During last visit in December 2021 she was given trial of Breztri. She was previously on Pulmicort nebulizer + Duonebs d/t stroke. She had a routine chest x-ray on 12/28/2019 showed coarse bibasilar interstitial opacities, more conspicuous than prior study.  This was independently reviewed by Dr. Elsworth Soho, no new infiltrates or effusion, left upper lobe calcified granuloma is prior.  She is doing alright today. She reports being more shortness of breath in the afternoons. She does not feel [employer]  inhaler is lasting long enough. Her breathing is pretty good in the morning. She takes her medications around 11am. She experiences shortness of breath approx 6 hours later around 5pm in the afternoon. She will use her Proair rescue inhaler which does help. She takes second dose of Breztri around 11pm.   She is currently dealing with allergy symptoms including nasal congestion  and PND. She is using simply saline twice a day and Flonase nasal spray. She has a congested cough with difficulty getting mucus up. At times she gets winded walking to bathroom. She is on 2-3L oxygen continuously, she may briefly increase her oxygen when dyspneic. She ambulates with a walker. She does not have much strength in her left hand since stroke. She has difficulty opening containers.    07/04/2020 - Interim hx  Patient presents today for 3 month follow-up. She had a hard time getting to her appointment today. She had an episode of shortness of breath at home today. She notes that her oxygen was 92% on 2.5L and HR was 122. She has a history of permanent afib. She has trouble with her breathing in hot/humid weather. She currently has a cough and allergy symptoms. She is using BREZTRI twice a day. She is on 3L oxygen. She is still having bladder problems, states that she has difficulty starting stream and has some dribbling. She has been seen by Urology in the past.   Significant tests/ events CT angio chest 07/2015 >> no pulmonary embolism, no emphysema, calcified mediastinal LNs PFTs 09/2015>> severe airway obstruction with FEV1 40%, improves to 47% with albuterol, DLCO decreased to 45%   Allergies  Allergen Reactions   Prozac [Fluoxetine Hcl] Other (See Comments)    seizures   Sulfa Antibiotics Nausea And Vomiting and Other (See Comments)    TINGLING IN LEGS ALSO   Micardis [Telmisartan] Swelling and Other (See Comments)    Swelling, bruising Swelling, bruising   Prednisone Other (See Comments)    mood changes Mood changes   Lisinopril-Hydrochlorothiazide Other (See Comments)    unknown   Augmentin [Amoxicillin-Pot Clavulanate] Rash   Avelox [Moxifloxacin Hcl In Nacl] Rash    LEVAQUIN IS OK-SEE NOTE   Norvasc [Amlodipine Besylate] Other (See Comments)    fatigue    Immunization History  Administered Date(s) Administered   Influenza Split 01/03/2008, 11/06/2008   Influenza,  High Dose Seasonal PF 11/22/2013, 11/27/2014, 10/28/2016, 10/27/2017, 09/14/2018, 10/24/2019   Influenza,inj,Quad PF,6+ Mos 10/19/2018   Influenza-Unspecified 10/08/2015   PFIZER(Purple Top)SARS-COV-2 Vaccination 02/02/2019, 02/23/2019, 11/26/2019   Pneumococcal Conjugate-13 11/22/2013   Pneumococcal Polysaccharide-23 05/05/2008   Td 05/22/2003   Tdap 08/20/2012   Zoster, Live 08/20/2012    Past Medical History:  Diagnosis Date   Afib ([place])    diagnosed 05/17/19   Arthritis    BRCA2 positive 2017   COPD (chronic obstructive pulmonary disease) ([place])    Diabetes mellitus without complication ([place])    Diverticulosis    Dysrhythmia    Afib   Hearing loss    HTN (hypertension)    Memory loss    Pre-diabetes    Seasonal depression ([place])    Seizure disorder, complex partial, 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    Vestibulitis of ear     Tobacco History: Social History   Tobacco Use  Smoking  Status Former   Packs/day: 1.00   Years: 41.00   Pack years: 41.00   Types: Cigarettes   Quit date: 01/13/2006   Years since quitting: 14.5  Smokeless Tobacco Never   Counseling given: Not Answered   Outpatient Medications Prior to Visit  Medication Sig Dispense Refill   acetaminophen (TYLENOL) 325 MG tablet Take 1-2 tablets (325-650 mg total) by mouth every 4 (four) hours as needed for mild pain.     albuterol (PROAIR HFA) 108 (90 Base) MCG/ACT inhaler Inhale 2 puffs into the lungs every 6 (six) hours as needed for wheezing or shortness of breath. 24 g 1   aspirin EC 81 MG tablet Take 81 mg by mouth daily.     atorvastatin (LIPITOR) 20 MG tablet TAKE 1 TABLET DAILY 90 tablet 3   budesonide (PULMICORT) 0.25 MG/2ML nebulizer solution 2 ml     captopril (CAPOTEN) 50 MG tablet Take 50 mg by mouth 2 (two) times daily.      Cholecalciferol (VITAMIN D3) 2000 units capsule Take 2,000 Units by mouth daily.      clonazePAM (KLONOPIN) 2 MG tablet 2 mg at bedtime, for insomnia- agitation. The patient is allowed to take 0.5 tabs if needed for panic attack. 180 tablet 1   ELIQUIS 5 MG TABS tablet TAKE 1 TABLET TWICE A DAY 180 tablet 1   famotidine (PEPCID) 20 MG tablet Take 1 tablet (20 mg total) by mouth 2 (two) times daily. 60 tablet 0   fluticasone (FLONASE) 50 MCG/ACT nasal spray Place 1 spray into the nose daily as needed for allergies. Spray twice daily as needed     gabapentin (NEURONTIN) 600 MG tablet Take 0.5 tablets (300 mg total) by mouth at bedtime. 45 tablet 3   hydrocerin (EUCERIN) CREA Apply 1 application topically 2 (two) times daily.  0   ipratropium-albuterol (DUONEB) 0.5-2.5 (3) MG/3ML SOLN 3 ml as needed     levETIRAcetam (KEPPRA) 750 MG tablet Take 1 tablet (750 mg total) by mouth 2 (two) times daily. 180 tablet 3   LEXAPRO 20 MG tablet Take 1 tablet (20 mg total) by mouth daily. Morning dosage 90 tablet 3   loratadine (CLARITIN) 10 MG tablet Take 1 tablet (10 mg total) by mouth daily.     Menthol-Methyl Salicylate (MUSCLE RUB) 10-15 % CREA Apply 1 application topically as needed for muscle pain. To neck and shoulder  0   metFORMIN (GLUCOPHAGE) 500 MG tablet Take 500 mg by mouth daily.     metoprolol tartrate (LOPRESSOR) 25 MG tablet Take 25 mg by mouth daily.     polyethylene glycol (MIRALAX / GLYCOLAX) 17 g packet Take 17 g by mouth daily. Purchase bottle over the counter and use capful daily 14 each 0   sodium chloride (OCEAN) 0.65 % SOLN nasal spray Place 1 spray into both nostrils in the morning, at noon, in the evening, and at bedtime.  0   Spacer/Aero-Holding Chambers (AEROCHAMBER PLUS WITH MASK) inhaler As needed with inhaler 1 each 0   vitamin B-12 (CYANOCOBALAMIN) 500 MCG tablet Take 1 tablet (500 mcg total) by mouth daily.     Budeson-Glycopyrrol-Formoterol (BREZTRI AEROSPHERE) 160-9-4.8  MCG/ACT AERO Inhale 2 puffs into the lungs 2 (two) times daily. 32.1 g 3   diltiazem (CARDIZEM CD) 300 MG 24 hr capsule Take 1 capsule (300 mg total) by mouth daily. 90 capsule 3   Tiotropium Bromide-Olodaterol (STIOLTO RESPIMAT) 2.5-2.5 MCG/ACT AERS      No facility-administered medications prior to visit.  Review of Systems  Review of Systems  Constitutional: Negative.   HENT:  Positive for postnasal drip.   Respiratory:  Positive for cough.     Physical Exam  BP 138/86 (BP Location: Right Arm, Patient Position: Sitting, Cuff Size: Normal)   Pulse 95   Ht 5' 4" (1.626 m)   Wt 261 lb 12.8 oz (118.8 kg)   LMP 01/14/1995 (Approximate)   SpO2 97%   BMI 44.94 kg/m  Physical Exam Constitutional:      Appearance: Normal appearance.  HENT:     Head: Normocephalic and atraumatic.     Mouth/Throat:     Mouth: Mucous membranes are moist.     Pharynx: Oropharynx is clear.  Cardiovascular:     Rate and Rhythm: Normal rate and regular rhythm.  Pulmonary:     Effort: Pulmonary effort is normal.     Breath sounds: Normal breath sounds.  Neurological:     General: No focal deficit present.     Mental Status: She is alert and oriented to person, place, and time. Mental status is at baseline.  Psychiatric:        Mood and Affect: Mood normal.        Thought Content: Thought content normal.        Judgment: Judgment normal.     Lab Results:  CBC    Component Value Date/Time   WBC 14.7 (H) 08/02/2020 1221   WBC 10.2 11/21/2019 1134   RBC 4.33 08/02/2020 1221   RBC 4.11 11/21/2019 1134   HGB 13.5 08/02/2020 1221   HCT 41.4 08/02/2020 1221   PLT 317 08/02/2020 1221   MCV 96 08/02/2020 1221   MCH 31.2 08/02/2020 1221   MCH 30.9 11/21/2019 1134   MCHC 32.6 08/02/2020 1221   MCHC 31.9 11/21/2019 1134   RDW 12.9 08/02/2020 1221   LYMPHSABS 2.1 11/21/2019 1134   MONOABS 0.9 11/21/2019 1134   EOSABS 0.2 11/21/2019 1134   BASOSABS 0.1 11/21/2019 1134    BMET     Component Value Date/Time   NA 146 (H) 08/02/2020 1221   K 4.3 08/02/2020 1221   CL 99 08/02/2020 1221   CO2 27 08/02/2020 1221   GLUCOSE 109 (H) 08/02/2020 1221   GLUCOSE 112 (H) 11/21/2019 1134   BUN 13 08/02/2020 1221   CREATININE 0.67 08/02/2020 1221   CREATININE 0.85 02/22/2018 1051   CALCIUM 9.7 08/02/2020 1221   GFRNONAA 59 (L) 11/21/2019 1134   GFRNONAA >60 02/22/2018 1051   GFRAA >60 06/20/2019 0657   GFRAA >60 02/22/2018 1051    BNP    Component Value Date/Time   BNP 24.5 07/30/2015 2103    ProBNP No results found for: PROBNP  Imaging: No results found.   Assessment & Plan:   COPD (chronic obstructive pulmonary disease) (HCC) - Shortness of breath is worse in heat/humidity. CXR today showed no acute abnormality. 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   Chronic respiratory failure (HCC) - Continue 3L oxygen  Paroxysmal atrial fibrillation ([place]) - Patient had episode of dyspnea today with elevated HR. EKG confirmed Afib, rate was normal. Advised she contact cardiology to notify them. Continue Eliquis and Diltiazem  FU in 1 month  Martyn Ehrich, NP 08/14/2020

## 2020-07-04 NOTE — Progress Notes (Signed)
Please let patient know CXR showed no acute cardiopulmonary process. 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 The lungs are clear bilaterally. Degenerative changes to thoracic spine

## 2020-07-04 NOTE — Patient Instructions (Addendum)
EKG showed that you are in Afib, rate is normal. Please contact cardiology to notify them. Continue Eliquis and Diltiazem   Changing inhalers d/t urinary issues    Recommendations: Stop [employer] Advair - two puffs morning and evening   Orders: -CXR today   Follow-up: 1 month with [name] NP

## 2020-07-05 ENCOUNTER — Telehealth: Payer: Self-pay | Admitting: Primary Care

## 2020-07-05 NOTE — Telephone Encounter (Signed)
Received broken chamber from up front. Replaced. Called and spoke with patient's husband Jenny Reichmann. Advised him that the new chamber is upfront for him to pick up.  Nothing further needed at this time.

## 2020-07-06 ENCOUNTER — Encounter: Payer: Self-pay | Admitting: *Deleted

## 2020-07-23 ENCOUNTER — Inpatient Hospital Stay: Payer: Medicare Other

## 2020-07-23 ENCOUNTER — Inpatient Hospital Stay: Payer: Medicare Other | Admitting: Oncology

## 2020-07-23 ENCOUNTER — Telehealth: Payer: Self-pay | Admitting: Oncology

## 2020-07-23 NOTE — Telephone Encounter (Signed)
Scheduled appointment per 07/11 sch msg. Patient is aware. 

## 2020-07-23 NOTE — Progress Notes (Signed)
Pt contacted per her request. Pt not feeling well today and wanted to reschedule. Notified pt scheduling would contact her. Pt verbalized understanding.

## 2020-07-25 ENCOUNTER — Telehealth: Payer: Self-pay | Admitting: Primary Care

## 2020-07-25 NOTE — Telephone Encounter (Signed)
Instructions from last OV 6/22  EKG showed that you are in Afib, rate is normal. Please contact cardiology to notify them. Continue Eliquis and Diltiazem   Changing inhalers d/t urinary issues      Recommendations: Stop [employer] Advair - two puffs morning and evening   Orders: -CXR today   Follow-up: 1 month with Beth NP      Called and spoke with pt who states since being switched from [place] to Advair, she feels like she has gotten worse. States she has been feeling shaky, has had worsening SOB since switching, and also states she has had a lot of phlegm in head which is draining from nose to her throat which is making her cough.  Pt states that she also has not been feeling right as her pulse is still all over the place ranging from 84-103. Pt said that she has not contacted cardiology and didn't have their phone number so I provided her with Dr. Hassell Done phone number so she could call him. Pt also stated that her O2 sats have been ranging from 87-89% on 2L. States if she gets up to move around, she becomes weak and might bump O2 up to 3L.  Due to her symptoms she has been having after switching to Advair, pt wants to know what we recommend. Beth, please advise.

## 2020-07-25 NOTE — Telephone Encounter (Signed)
Called and spoke with pt letting her know the info stated by Beth and she verbalized understanding. Nothing further needed. 

## 2020-07-25 NOTE — Telephone Encounter (Signed)
Pt was put on a new inhaler a little over a week ago and stated that she has gotten worse from using the inhaler; Advair HFA and was wanting to know if she can use Proair inhaler instead.   Pharmacy; [facility], [place] [address]; [phone]

## 2020-07-25 NOTE — Telephone Encounter (Addendum)
She can go back to [place] if urinary issues did not particularly improve one way or the other with change in her inhalers. Needs to follow-up with Cardiology. If HR >120 and O2 <88% despite increasing to 3L would present to ED.

## 2020-08-01 NOTE — Progress Notes (Signed)
Cardiology Office Note   Date:  08/02/2020   ID:  Beth, [name] [birth year], MRN [id number]  PCP:  Beth Lass, MD    No chief complaint on file.  AFib  Wt Readings from Last 3 Encounters:  08/02/20 261 lb 3.2 oz (118.5 kg)  07/04/20 261 lb 12.8 oz (118.8 kg)  05/23/20 262 lb (118.8 kg)       History of Present Illness: Beth [name] is a 80 y.o. female   Who I saw in  2009  After stroke. 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  Subsequent seizures. Has some word finding issues and cognitive deficits with emotional lability She takes Keppra and Klonopin and is unable to drive, according to prior records.   In 2017, she was seen by Beth [name] for palpitations.  Echo at that time: "Left ventricle: The cavity size was normal. Wall thickness was   normal. Systolic function was vigorous. The estimated ejection   fraction was in the range of 65% to 70%. Wall motion was normal;   there were no regional wall motion abnormalities. Doppler   parameters are consistent with abnormal left ventricular   relaxation (grade 1 diastolic dysfunction). Doppler parameters   are consistent with high ventricular filling pressure. - Mitral valve: Calcified annulus.   Impressions:   - Vigorous LV systolic function; grade 1 diastolic dysfunction with   elevated LV filling pressure; trace MR and TR."    Since the last visit, she has had some SHOB.  Worse with exertion.  SHe feels weak.  She is on chronic oxygen for COPD.  New onset AFib in the setting of TCAR in 6/21, for symptomatic right carotid stenosis. Managed by Dr. Scot [name].  ECG from May 2021 shows A. fib with rapid ventricular response.   Eliquis was added and restarted on postop day 2 after her TCAR.  Clopidogrel  Was stopped.    She has had issues with her COPD.      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    Vestibulitis of ear     Past Surgical History:  Procedure Laterality Date   broken ankle Right    TONSILLECTOMY     TRANSCAROTID ARTERY REVASCULARIZATION  Right 06/20/2019   TRANSCAROTID ARTERY REVASCULARIZATION  Right 06/20/2019   Procedure: RIGHT TRANSCAROTID ARTERY REVASCULARIZATION;  Surgeon: Angelia Mould, MD;  Location: [facility] OR;  Service: Vascular;  Laterality: Right;     Current Outpatient Medications  Medication Sig Dispense Refill   acetaminophen (TYLENOL) 325 MG tablet Take 1-2 tablets (325-650 mg total) by mouth every 4 (four) hours as needed for mild pain.     albuterol (PROAIR HFA) 108 (90 Base) MCG/ACT inhaler Inhale 2 puffs into the lungs every 6 (six) hours as needed for wheezing or  shortness of breath. 24 g 1   aspirin EC 81 MG tablet Take 81 mg by mouth daily.     atorvastatin (LIPITOR) 20 MG tablet TAKE 1 TABLET DAILY 90 tablet 3   Budeson-Glycopyrrol-Formoterol 160-9-4.8 MCG/ACT AERO Inhale 2 puffs into the lungs in the morning and at bedtime.     budesonide (PULMICORT) 0.25 MG/2ML nebulizer solution 2 ml     captopril (CAPOTEN) 50 MG tablet Take 50 mg by mouth 2 (two) times daily.     Cholecalciferol (VITAMIN D3) 2000 units capsule Take 2,000 Units by mouth daily.      clonazePAM (KLONOPIN) 2 MG tablet 2 mg at bedtime, for insomnia- agitation. The patient is allowed to take 0.5 tabs if needed for panic attack. 180 tablet 1   diltiazem (CARDIZEM CD) 300 MG 24 hr capsule Take 1 capsule (300 mg total) by  mouth daily. 90 capsule 3   ELIQUIS 5 MG TABS tablet TAKE 1 TABLET TWICE A DAY 180 tablet 1   famotidine (PEPCID) 20 MG tablet Take 1 tablet (20 mg total) by mouth 2 (two) times daily. 60 tablet 0   fluticasone (FLONASE) 50 MCG/ACT nasal spray Place 1 spray into the nose daily as needed for allergies. Spray twice daily as needed     gabapentin (NEURONTIN) 600 MG tablet Take 0.5 tablets (300 mg total) by mouth at bedtime. 45 tablet 3   hydrocerin (EUCERIN) CREA Apply 1 application topically 2 (two) times daily.  0   ipratropium-albuterol (DUONEB) 0.5-2.5 (3) MG/3ML SOLN 3 ml as needed     levETIRAcetam (KEPPRA) 750 MG tablet Take 1 tablet (750 mg total) by mouth 2 (two) times daily. 180 tablet 3   LEXAPRO 20 MG tablet Take 1 tablet (20 mg total) by mouth daily. Morning dosage 90 tablet 3   loratadine (CLARITIN) 10 MG tablet Take 1 tablet (10 mg total) by mouth daily.     Menthol-Methyl Salicylate (MUSCLE RUB) 10-15 % CREA Apply 1 application topically as needed for muscle pain. To neck and shoulder  0   metFORMIN (GLUCOPHAGE) 500 MG tablet Take 500 mg by mouth daily.     metoprolol tartrate (LOPRESSOR) 25 MG tablet Take 25 mg by mouth daily.     polyethylene glycol (MIRALAX / GLYCOLAX) 17 g packet Take 17 g by mouth daily. Purchase bottle over the counter and use capful daily 14 each 0   sodium chloride (OCEAN) 0.65 % SOLN nasal spray Place 1 spray into both nostrils in the morning, at noon, in the evening, and at bedtime.  0   Spacer/Aero-Holding Chambers (AEROCHAMBER PLUS WITH MASK) inhaler As needed with inhaler 1 each 0   vitamin B-12 (CYANOCOBALAMIN) 500 MCG tablet Take 1 tablet (500 mcg total) by mouth daily.     No current facility-administered medications for this visit.    Allergies:   Prozac [fluoxetine hcl], Sulfa antibiotics, Micardis [telmisartan], Prednisone, Lisinopril-hydrochlorothiazide, Augmentin [amoxicillin-pot clavulanate], Avelox [moxifloxacin hcl in nacl], and Norvasc  [amlodipine besylate]    Social History:  The patient  reports that she quit smoking about 14 years ago. Her smoking use included cigarettes. She has a 41.00 pack-year smoking history. She has never used smokeless tobacco. She reports that she does not drink alcohol and does not use drugs.   Family History:  The patient's family history includes Bleeding Disorder in her maternal grandfather; Breast cancer (age of onset: 63) in her daughter; Colon cancer in her maternal aunt; Dementia in her maternal grandfather and mother; Emphysema  in her maternal aunt, maternal aunt, and maternal uncle; High blood pressure in her maternal grandmother; Osteoporosis in her mother; Other in her daughter and daughter; Ovarian cancer (age of onset: 6) in her paternal grandmother; Ovarian cancer (age of onset: 78) in her paternal aunt; Stroke in her mother; Thyroid disease in her mother.    ROS:  Please see the history of present illness.   Otherwise, review of systems are positive for difficulty getting a bandage off of right hand.   All other systems are reviewed and negative.    PHYSICAL EXAM: VS:  BP (!) 172/82   Pulse (!) 101   Ht '5\' 4"'  (1.626 m)   Wt 261 lb 3.2 oz (118.5 kg)   LMP 01/14/1995 (Approximate)   SpO2 (!) 84% Comment: CURRENTLY ON OXYGEN  BMI 44.83 kg/m  , BMI Body mass index is 44.83 kg/m. GEN: Well nourished, well developed, in no acute distress; wearing oxygen HEENT: normal Neck: no JVD, carotid bruits, or masses Cardiac: irregularly irregular, ; no murmurs, rubs, or gallops,no edema  Respiratory:  clear to auscultation bilaterally, normal work of breathing GI: soft, nontender, nondistended, + BS, obese MS: no deformity or atrophy Skin: warm and dry, no rash Neuro:  Strength and sensation are intact Psych: euthymic mood, full affect   EKG:   The ekg ordered 6/22 demonstrates AFib, controlled ventricular response   Recent Labs: 11/21/2019: ALT 10; BUN 15; Creatinine, Ser 0.98;  Hemoglobin 12.7; Platelets 256; Potassium 4.0; Sodium 142   Lipid Panel    Component Value Date/Time   CHOL 151 05/17/2019 0346   TRIG 172 (H) 05/17/2019 0346   HDL 39 (L) 05/17/2019 0346   CHOLHDL 3.9 05/17/2019 0346   VLDL 34 05/17/2019 0346   LDLCALC 78 05/17/2019 0346     Other studies Reviewed: Additional studies/ records that were reviewed today with results demonstrating: labs reviewed. No CBC since 11/21.   ASSESSMENT AND PLAN:  AFib: Eliquis for stroke prevention.  No bleeding problems. Apical rate > 100.  Will increase Cardizem CD 360 mg daily. Needs CBC today due to Eliquis. HTN: The current medical regimen is effective;  continue present plan and medications. LE edema: Elevate legs. Reports some cramps so will check BMet as well.  PAD: LDL target < 100.  Continue statin.  Hyperlipidemia: LDL 66, TG 216 in 04/2020.  Whole food, plant based diet. Increase fiber intake.  Right hand bandage changed.   Current medicines are reviewed at length with the patient today.  The patient concerns regarding her medicines were addressed.  The following changes have been made:  increase diltiazem  Labs/ tests ordered today include: CBC, BMet No orders of the defined types were placed in this encounter.   Recommend 150 minutes/week of aerobic exercise Low fat, low carb, high fiber diet recommended  Disposition:   FU in 9 months   Signed, Larae Grooms, MD  08/02/2020 11:55 AM    [facility] [address], [place], [place]  [address] Phone: [phone]; Fax: [phone]

## 2020-08-02 ENCOUNTER — Other Ambulatory Visit: Payer: Self-pay

## 2020-08-02 ENCOUNTER — Ambulatory Visit (INDEPENDENT_AMBULATORY_CARE_PROVIDER_SITE_OTHER): Payer: Medicare Other | Admitting: Interventional Cardiology

## 2020-08-02 ENCOUNTER — Encounter: Payer: Self-pay | Admitting: Interventional Cardiology

## 2020-08-02 VITALS — BP 172/82 | HR 101 | Ht 64.0 in | Wt 261.2 lb

## 2020-08-02 DIAGNOSIS — R252 Cramp and spasm: Secondary | ICD-10-CM

## 2020-08-02 DIAGNOSIS — I10 Essential (primary) hypertension: Secondary | ICD-10-CM | POA: Diagnosis not present

## 2020-08-02 DIAGNOSIS — I4821 Permanent atrial fibrillation: Secondary | ICD-10-CM | POA: Diagnosis not present

## 2020-08-02 DIAGNOSIS — E782 Mixed hyperlipidemia: Secondary | ICD-10-CM | POA: Diagnosis not present

## 2020-08-02 DIAGNOSIS — R600 Localized edema: Secondary | ICD-10-CM | POA: Diagnosis not present

## 2020-08-02 DIAGNOSIS — I6521 Occlusion and stenosis of right carotid artery: Secondary | ICD-10-CM | POA: Diagnosis not present

## 2020-08-02 MED ORDER — DILTIAZEM HCL ER COATED BEADS 360 MG PO CP24: 360.0000 mg | ORAL_CAPSULE | Freq: Every day | ORAL | 3 refills | Status: DC

## 2020-08-02 NOTE — Patient Instructions (Signed)
Medication Instructions:  Your physician has recommended you make the following change in your medication: Increase Cardizem CD to 360 mg by mouth daily  *If you need a refill on your cardiac medications before your next appointment, please call your pharmacy*   Lab Work: Lab work to be done today--CBC and BMP If you have labs (blood work) drawn today and your tests are completely normal, you will receive your results only by: [place] (if you have MyChart) OR A paper copy in the mail If you have any lab test that is abnormal or we need to change your treatment, we will call you to review the results.   Testing/Procedures: none   Follow-Up: At [facility], you and your health needs are our priority.  As part of our continuing mission to provide you with exceptional heart care, we have created designated Provider Care Teams.  These Care Teams include your primary Cardiologist (physician) and Advanced Practice Providers (APPs -  Physician Assistants and Nurse Practitioners) who all work together to provide you with the care you need, when you need it.  We recommend signing up for the patient portal called "MyChart".  Sign up information is provided on this After Visit Summary.  MyChart is used to connect with patients for Virtual Visits (Telemedicine).  Patients are able to view lab/test results, encounter notes, upcoming appointments, etc.  Non-urgent messages can be sent to your provider as well.   To learn more about what you can do with MyChart, go to [email or web address].    Your next appointment:   9 month(s)  The format for your next appointment:   In Person  Provider:   You may see Larae Grooms, MD or one of the following Advanced Practice Providers on your designated Care Team:   Melina Copa, PA-C Ermalinda Barrios, PA-C   Other Instructions

## 2020-08-03 LAB — BASIC METABOLIC PANEL
BUN/Creatinine Ratio: 19 (ref 12–28)
BUN: 13 mg/dL (ref 8–27)
CO2: 27 mmol/L (ref 20–29)
Calcium: 9.7 mg/dL (ref 8.7–10.3)
Chloride: 99 mmol/L (ref 96–106)
Creatinine, Ser: 0.67 mg/dL (ref 0.57–1.00)
Glucose: 109 mg/dL — ABNORMAL HIGH (ref 65–99)
Potassium: 4.3 mmol/L (ref 3.5–5.2)
Sodium: 146 mmol/L — ABNORMAL HIGH (ref 134–144)
eGFR: 89 mL/min/{1.73_m2} (ref >59)

## 2020-08-03 LAB — CBC
Hematocrit: 41.4 % (ref 34.0–46.6)
Hemoglobin: 13.5 g/dL (ref 11.1–15.9)
MCH: 31.2 pg (ref 26.6–33.0)
MCHC: 32.6 g/dL (ref 31.5–35.7)
MCV: 96 fL (ref 79–97)
Platelets: 317 10*3/uL (ref 150–450)
RBC: 4.33 x10E6/uL (ref 3.77–5.28)
RDW: 12.9 % (ref 11.7–15.4)
WBC: 14.7 10*3/uL — ABNORMAL HIGH (ref 3.4–10.8)

## 2020-08-14 NOTE — Assessment & Plan Note (Addendum)
-   Patient had episode of dyspnea today with elevated HR. EKG confirmed Afib, rate was normal. Advised she contact cardiology to notify them. Continue Eliquis and Diltiazem

## 2020-08-14 NOTE — Assessment & Plan Note (Signed)
-   Continue 3L oxygen

## 2020-08-14 NOTE — Assessment & Plan Note (Addendum)
-   Shortness of breath is worse in heat/humidity. CXR today showed no acute abnormality. 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

## 2020-08-22 ENCOUNTER — Other Ambulatory Visit: Payer: Self-pay | Admitting: Interventional Cardiology

## 2020-08-22 NOTE — Telephone Encounter (Signed)
Prescription refill request for Eliquis received. Indication: Afib Last office visit: 08/02/20 [place])  Scr: 0.67 (08/02/20) Age: 80 Weight: 118.5kg  Appropriate dose and refill sent to requested pharmacy.

## 2020-09-06 ENCOUNTER — Ambulatory Visit (INDEPENDENT_AMBULATORY_CARE_PROVIDER_SITE_OTHER): Payer: Medicare Other | Admitting: Neurology

## 2020-09-06 ENCOUNTER — Encounter: Payer: Self-pay | Admitting: Neurology

## 2020-09-06 VITALS — BP 158/73 | HR 93 | Ht 64.0 in | Wt 261.0 lb

## 2020-09-06 DIAGNOSIS — F0151 Vascular dementia with behavioral disturbance: Secondary | ICD-10-CM | POA: Diagnosis not present

## 2020-09-06 DIAGNOSIS — I6521 Occlusion and stenosis of right carotid artery: Secondary | ICD-10-CM

## 2020-09-06 DIAGNOSIS — F09 Unspecified mental disorder due to known physiological condition: Secondary | ICD-10-CM | POA: Diagnosis not present

## 2020-09-06 DIAGNOSIS — I4821 Permanent atrial fibrillation: Secondary | ICD-10-CM | POA: Diagnosis not present

## 2020-09-06 DIAGNOSIS — J42 Unspecified chronic bronchitis: Secondary | ICD-10-CM

## 2020-09-06 DIAGNOSIS — J9611 Chronic respiratory failure with hypoxia: Secondary | ICD-10-CM

## 2020-09-06 DIAGNOSIS — F01518 Vascular dementia, unspecified severity, with other behavioral disturbance: Secondary | ICD-10-CM

## 2020-09-06 MED ORDER — LEVETIRACETAM 750 MG PO TABS: 750.0000 mg | ORAL_TABLET | Freq: Two times a day (BID) | ORAL | 3 refills | Status: DC

## 2020-09-06 MED ORDER — GABAPENTIN 600 MG PO TABS: 300.0000 mg | ORAL_TABLET | Freq: Every day | ORAL | 3 refills | Status: DC

## 2020-09-06 MED ORDER — MEMANTINE HCL 5 MG PO TABS: 5.0000 mg | ORAL_TABLET | Freq: Two times a day (BID) | ORAL | 6 refills | Status: DC

## 2020-09-06 NOTE — Patient Instructions (Addendum)
Memantine Tablets What is this medication? MEMANTINE (MEM an teen) is used to treat dementia caused by Alzheimer's disease. This medicine may be used for other purposes; ask your health care provider orpharmacist if you have questions. COMMON BRAND NAME(S): Namenda What should I tell my care team before I take this medication? They need to know if you have any of these conditions: difficulty passing urine kidney disease liver disease an unusual or allergic reaction to memantine, other medicines, foods, dyes, or preservatives pregnant or trying to get pregnant breast-feeding How should I use this medication? Take this medicine by mouth with a glass of water. Follow the directions on the prescription label. You may take this medicine with or without food. Take your doses at regular intervals. Do not take your medicine more often than directed. Continue to take your medicine even if you feel better. Do not stop takingexcept on the advice of your doctor or health care professional. Talk to your pediatrician regarding the use of this medicine in children.Special care may be needed. Overdosage: If you think you have taken too much of this medicine contact apoison control center or emergency room at once. NOTE: This medicine is only for you. Do not share this medicine with others. What if I miss a dose? If you miss a dose, take it as soon as you can. If it is almost time for your next dose, take only that dose. Do not take double or extra doses. If you do not take your medicine for several days, contact your health care provider.Your dose may need to be changed. What may interact with this medication? acetazolamide amantadine cimetidine dextromethorphan dofetilide hydrochlorothiazide ketamine metformin methazolamide quinidine ranitidine sodium bicarbonate triamterene This list may not describe all possible interactions. Give your health care provider a list of all the medicines, herbs,  non-prescription drugs, or dietary supplements you use. Also tell them if you smoke, drink alcohol, or use illegaldrugs. Some items may interact with your medicine. What should I watch for while using this medication? Visit your doctor or health care professional for regular checks on your progress. Check with your doctor or health care professional if there is noimprovement in your symptoms or if they get worse. You may get drowsy or dizzy. Do not drive, use machinery, or do anything that needs mental alertness until you know how this drug affects you. Do not stand or sit up quickly, especially if you are an older patient. This reduces the risk of dizzy or fainting spells. Alcohol can make you more drowsy and dizzy.Avoid alcoholic drinks. What side effects may I notice from receiving this medication? Side effects that you should report to your doctor or health care professionalas soon as possible: allergic reactions like skin rash, itching or hives, swelling of the face, lips, or tongue agitation or a feeling of restlessness depressed mood dizziness hallucinations redness, blistering, peeling or loosening of the skin, including inside the mouth seizures vomiting Side effects that usually do not require medical attention (report to yourdoctor or health care professional if they continue or are bothersome): constipation diarrhea headache nausea trouble sleeping This list may not describe all possible side effects. Call your doctor for medical advice about side effects. You may report side effects to FDA at1-800-FDA-1088. Where should I keep my medication? Keep out of the reach of children. Store at room temperature between 15 degrees and 30 degrees C (59 degrees and86 degrees F). Throw away any unused medicine after the expiration date. NOTE: This sheet is  a summary. It may not cover all possible information. If you have questions about this medicine, talk to your doctor, pharmacist, orhealth  care provider.  2022 Elsevier/Gold Standard (2012-10-18 14:10:42) Management of Memory Problems  There are some general things you can do to help manage your memory problems.  Your memory may not in fact recover, but by using techniques and strategies you will be able to manage your memory difficulties better.  1)  Establish a routine. Try to establish and then stick to a regular routine.  By doing this, you will get used to what to expect and you will reduce the need to rely on your memory.  Also, try to do things at the same time of day, such as taking your medication or checking your calendar first thing in the morning. Think about think that you can do as a part of a regular routine and make a list.  Then enter them into a daily planner to remind you.  This will help you establish a routine.  2)  Organize your environment. Organize your environment so that it is uncluttered.  Decrease visual stimulation.  Place everyday items such as keys or cell phone in the same place every day (ie.  Basket next to front door) Use post it notes with a brief message to yourself (ie. Turn off light, lock the door) Use labels to indicate where things go (ie. Which cupboards are for food, dishes, etc.) Keep a notepad and pen by the telephone to take messages  3)  Memory Aids A diary or journal/notebook/daily planner Making a list (shopping list, chore list, to do list that needs to be done) Using an alarm as a reminder (kitchen timer or cell phone alarm) Using cell phone to store information (Notes, Calendar, Reminders) Calendar/White board placed in a prominent position Post-it notes  In order for memory aids to be useful, you need to have good habits.  It's no good remembering to make a note in your journal if you don't remember to look in it.  Try setting aside a certain time of day to look in journal.  4)  Improving mood and managing fatigue. There may be other factors that contribute to memory  difficulties.  Factors, such as anxiety, depression and tiredness can affect memory. Regular gentle exercise can help improve your mood and give you more energy. Simple relaxation techniques may help relieve symptoms of anxiety Try to get back to completing activities or hobbies you enjoyed doing in the past. Learn to pace yourself through activities to decrease fatigue. Find out about some local support groups where you can share experiences with others. Try and achieve 7-8 hours of sleep at night.

## 2020-09-06 NOTE — Progress Notes (Signed)
GENERAL NEUROLOGY/ MEMORY  PATIENT: Beth [name] DOB: 03/12/1940  REASON FOR VISIT: Beth [name] is now 80 years old and seen in a RV on 09-06-2020.  80 y.o. year old female  has a past medical history of now persistent.and permanent  atrial fibrillation ( Dr Irish Lack ) 08-02-2020 [facility]), Arthritis, BRCA2 positive (2017), COPD (chronic obstructive pulmonary disease) ([place]), Diabetes mellitus without complication ([place]), Diverticulosis, Dysrhythmia, Hearing loss, HTN (hypertension), vascular dementia, Pre-diabetes, Seasonal depression ([place]), Seizure disorder, complex partial, 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[place]) (seizure free for one year), Seizures ([place]),, hemorrhagic ([place]) (aneurysm bleed. ), and Vestibulitis of ear. here with:   Vascular dementia, brain organic decline. Had no recent seizures and may tolerate namenda?  Here alone, unaccompanied- she doesn't want her spouse in the room . She feels free when she is alone-  Confessed to a fall at night on the way to the bathroom. Then stated that there were many more falls-  She has had a stroke, 2021: a left carotid stenosis, and atrial fib, d/c 5-10- 2021 to inpatient rehab.  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  She has not had seizures since December 2019 but she was hospitalized with a new diagnosis of stroke and atrial fibrillation.  She underwent a right-sided carotid endarterectomy. Now dementia.  She is oxygen dependent. Her voice is shaky and hoarse.  husband in the waiting room,  She is in a wheelchair because of being short of breath and on a 02 concentrator.  G feels she has more and more difficulties with her memory and tremors.    1-25-2022[name], 80 year-old female with longstanding seizure disorder and dcerebrovasualr disease. Pt alone, rm 10. Presents today for follow up visit. Overall stable. Denies any sz like activity. Pt has concerned  r/t her clonazepam. She states her PCP has a clinical pharmacist and they advised her that clonazepam was in 20 mg. (I informed the pt it is not) informed the pt for last couple years she has been getting 78m and ordered take 1 at bedtime. (pt then states that she remembers being told she could take 1.5 tab at bedtime if needed. She states that she has not needed an additional 0.5 tablet.  No new seizure activity. Declining cognitive ability, couldn't finish MOCA, did the mini-mental status exam. She reports difficulties to understand the TV news and speaks with dysarthria/ dysphonia. She is easily SOB.       I have the pleasure of meeting a long-term seizure patient LShavon Ashmore [name] on 07-11-2019- she is a Caucasian female right-handed and 80years of age.  She is accompanied today by her husband.   She took her medication in the morning and was trying to take her medication when she noted a flaccid left hand- this was 05-16-2019-and she was found to have a had a stroke, a left carotid stenosis, and atrial fib, d/c 5-10 2021 to inpatient rehab.  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  She has not had seizures since December 2019 but she was hospitalized with a new diagnosis of stroke and atrial fibrillation.  She underwent a right-sided carotid endarterectomy of which the scar is still visible today, she continues to walk with a walker nonseated is on chronic oxygen supplementation.  At 3 L a minute.   She has a mild tremor in  her right hand this is minor and her left hand remained fisted but she is able to extend the fingers and do repeated alternating movements visit.  Her speech is fluent. She does have bilateral severe leg edema.   02-07-2019: Rv for Beth [name], a long-time patient of our [place] practice. Mrs. Beth Oki. [name] is a meanwhile 80 year old Caucasian right-handed female patient who is seen here after a 48-month hiatus.  She has felt very isolated during the Covid pandemic, just got her first shot of the vaccine.  Mr. GBabememory has further declined over the last year and it has led to some sometimes 10 situations.  She has had a stable MoCA test and she is feeling that she is able to take very well care of herself.  She has not had recent seizure activity and not since her last visit with [place]  She just gotten a shipment of generic Keppra since the insurance is not covering the brand name anymore.   She is due to her social isolation somewhat frustrated and she endorsed the geriatric depression scale accordingly at 6 out of 15 points.  This seems to be situational and circumstantial..   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 In 2010 she injured her foot severely during a seizure and many surgeries to the ankle.  She became oxygen dependent in 2018 , has COPD. A cognitive decline has been evidient [facility]. She has difficulties to structure her thoughts, is logorrhoeic and yet tangential, but this has improved with oxygen ! She is less fatigued. More alert. She reports today is very good day, and she also reports having very bad ones. Some days she is too tired , too fatigued to move.  Beth [name] had no interval seizure activity, this makes her seizure-free for 12 months.    She continues to take a baby aspirin, albuterol inhaler, atenolol tablets 25 mg her daily dose, captopril 2 times a day, vitamin D,  Lexapro, Allegra and Klonopin 2 mg at bedtime.  She also has Flonase nasal spray, Mucinex as needed for cough, she takes Keppra 750 mg twice a day, she also is on tiotropium bromide, on Maxide and on ellipta ANORO.   The bedtime medication was needed for nocturnal agitation, and seizure and insomnia. We are  discussing her last MRI brain 04-2017, with mild microvascular changes, but [facility]     Montreal Cognitive Assessment  02/07/2020 07/11/2019 02/07/2019 10/14/2017 04/08/2016  Visuospatial/ Executive (0/5) '2 3 4 4 3  ' Naming (0/3) '3 2 3 3 2  ' Attention: Read list of digits (0/2) '1 2 1 2 2  ' Attention: Read list of letters (0/1) '1 1 1 ' 0 1  Attention: Serial 7 subtraction starting at 100 (0/3) 0 '3 3 2 2  ' Language: Repeat phrase (0/2) - '2 1 2 2  ' Language : Fluency (0/1) - '1 1 1 ' 0  Abstraction (0/2) - '2 2 2 2  ' Delayed Recall (0/5) - '5 4 5 5  ' Orientation (0/6) '3 6 6 6 6  ' Total -'11 27 26 27 25  ' Adjusted Score (based on education) -11 - - 27 26           04-13-2017, Patient is here for seizure follow up, concentration and memory problems.  Mrs. Scheuring endorsed  the geriatric depression score at 5 out of 15 points, which is a borderline result.  She states she had no seizure activity over the last 6-8 months, she is now using oxygen 24/7 portable tank is with her today. COPD  Mrs. Dupin reports that some of her routines and rituals have changed-certainly over the last year- again she feels that she is less organized, she has often skipped certain activities that she used to do in order. At  one time she accused her husband of getting lost while he was driving, because she could not recognize the surroundings. Not cooking or backing any longer. 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      History : MM Ms. Clemence is a 80 year old female with a history of seizures. She returns today for follow-up. She continues to take Keppra 750 mg twice a day. She denies any seizure events. She is able to complete all ADLs independently. She is currently not operating a motor vehicle. In the past the patient has reported repeatedly trouble with her memory. At the last visit her memory score has remained stable. Patient reports that her memory  continued to remained stable. She states that she is forgetful at times reports having a hard time recalling certain things such as a restaurant name. She states that she has been diagnosed with COPD. During this flare up she felt that she had an abnormal heart rhythm and was concerned about a potential stroke. However she reports she did not have any strokelike symptoms. She is currently on medication and inhalers to treat her COPD that she feels is working well for her. She returns today for an evaluation.   HISTORY 04/10/15: MM- Ms. Segel is a 80 year old female with a history of seizures. She returns today for follow-up. She is currently taking Keppra 750 mg twice a day. She reports that she is tolerating this medication well. She denies any new neurological symptoms. She states at home she is able to complete all ADLs independently. She stopped driving after her stroke. 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 She states that at the last visit Dr. Brett Fairy ordered Belsomra and gave her samples however she did not start this medication. She states that she has continue using the Klonopin. She reports that she has had some issues breathing and was given albuterol. She states that she is still having some difficulty breathing and her primary care mentioned a sleep study. She denies snoring. She denies any excessive daytime sleepiness. Denies a morning headache. She states that her husband has not witnessed any apnea events. The patient feels that her memory has remained stable. She just returned from traveling to see family members. She does report some numbness in the fingertips on the right hand. Occasionally will drop things in the right hand. However this time she does not want to proceed with any additional testing. She returns today for an evaluation.   HISTORY OF PRESENT ILLNESS: HISTORY 10/11/14 [facility]): Patient returned for a yearly follow up. She reports medical problems:   a carbuncle with MRSA, diagnosed spring 2016  and treated with ATB ( doxicycline) by Dr. Amy Martinique , her Dermatologist . The patient tolerated the oral doxycycline fine. Mrs. Bates's cognitive status remains stable we performed a [place] cognitive assessment test today and she scored well on the clock drawing . She has a mild tremor which impairs the contour of the clock face,  she scored well on this Trail Making Test name 3 animals had some trouble again this tremor on drawing a cube. Was able to do the attention tests but not subtraction. Language and abstraction were tested the patient only had a word fluency test of 9 points. She was able to score 4 out of 5 recall words and was fully oriented to date place and time. She scored 26 out of 30 points, is a  HS graduate. 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 No seizures in the interval time.    OLUWASEYI TULL is a 80 y.o. female here seen originally many years ago upon referral from [place] ,2005. Mrs. Krzywicki is an established patient of my practice. 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   She also has a history of hypertension, osteopenia, vertigo and some mild hearing loss, classified as vestibulitis in her older records.  Surgical history is positive for right foot fracture in May 2010 and 4 surgeries to the ankle. Her ankle was fractured in a seizure related incident 2010,   twisting her foot in the midst of her typical left twisting body movements during a seizure.   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  She is taking 20 mg daily. She also takes vitamin D against fatigue. Her affect is better controlled and she feels more assured, and now is less  socially isolated.   Mrs. Uyeno had been seizure-free for over 12 months by the time of her April 2014 visit, now she reports that she had one seizure around Christmas 2014, while compliant on her current regimen of Keppra and Clonazepam.   She fills her medications at [facility] on [address]  in [place].Vestibular evaluation for severe vertigo    REVIEW OF SYSTEMS: Out of a complete 14 system review of symptoms, the patient complains only of the following symptoms, and all other reviewed systems are negative.  MCI - since stroke, small vessel; diease. Seizures.   She has a mild tremor in her right hand this is minor and her left hand remained fisted but she is able to extend the fingers and do repeated alternating movements visit.  Her speech is fluent. She does have bilateral severe leg edema.  ALLERGIES: Allergies  Allergen Reactions   Prozac [Fluoxetine Hcl] Other (See Comments)    seizures   Sulfa Antibiotics Nausea And Vomiting and Other (See Comments)    TINGLING IN LEGS ALSO   Micardis [Telmisartan] Swelling and Other (See Comments)    Swelling, bruising Swelling, bruising   Prednisone Other (See Comments)    mood changes Mood changes   Lisinopril-Hydrochlorothiazide Other (See Comments)    unknown   Augmentin [Amoxicillin-Pot Clavulanate] Rash   Avelox [Moxifloxacin Hcl In Nacl] Rash    LEVAQUIN IS OK-SEE NOTE   Norvasc [Amlodipine Besylate] Other (See Comments)    fatigue    HOME MEDICATIONS: Outpatient Medications Prior to Visit  Medication Sig Dispense Refill   acetaminophen (TYLENOL) 325 MG tablet Take 1-2 tablets (325-650 mg total) by mouth every 4 (four) hours as needed for mild pain.     albuterol (PROAIR HFA) 108 (90 Base) MCG/ACT inhaler Inhale 2 puffs into the lungs every 6 (six)  hours as needed for wheezing or shortness of breath. 24 g 1   aspirin EC 81 MG tablet Take 81 mg by mouth daily.     atorvastatin (LIPITOR) 20 MG tablet TAKE 1 TABLET  DAILY 90 tablet 3   Budeson-Glycopyrrol-Formoterol 160-9-4.8 MCG/ACT AERO Inhale 2 puffs into the lungs in the morning and at bedtime.     budesonide (PULMICORT) 0.25 MG/2ML nebulizer solution 2 ml     captopril (CAPOTEN) 50 MG tablet Take 50 mg by mouth 2 (two) times daily.     Cholecalciferol (VITAMIN D3) 2000 units capsule Take 2,000 Units by mouth daily.      clonazePAM (KLONOPIN) 2 MG tablet 2 mg at bedtime, for insomnia- agitation. The patient is allowed to take 0.5 tabs if needed for panic attack. 180 tablet 1   diltiazem (CARDIZEM CD) 360 MG 24 hr capsule Take 1 capsule (360 mg total) by mouth daily. 90 capsule 3   ELIQUIS 5 MG TABS tablet TAKE 1 TABLET TWICE A DAY 180 tablet 3   famotidine (PEPCID) 20 MG tablet Take 1 tablet (20 mg total) by mouth 2 (two) times daily. 60 tablet 0   fluticasone (FLONASE) 50 MCG/ACT nasal spray Place 1 spray into the nose daily as needed for allergies. Spray twice daily as needed     gabapentin (NEURONTIN) 600 MG tablet Take 0.5 tablets (300 mg total) by mouth at bedtime. 45 tablet 3   hydrocerin (EUCERIN) CREA Apply 1 application topically 2 (two) times daily.  0   ipratropium-albuterol (DUONEB) 0.5-2.5 (3) MG/3ML SOLN 3 ml as needed     levETIRAcetam (KEPPRA) 750 MG tablet Take 1 tablet (750 mg total) by mouth 2 (two) times daily. 180 tablet 3   LEXAPRO 20 MG tablet Take 1 tablet (20 mg total) by mouth daily. Morning dosage 90 tablet 3   loratadine (CLARITIN) 10 MG tablet Take 1 tablet (10 mg total) by mouth daily.     Menthol-Methyl Salicylate (MUSCLE RUB) 10-15 % CREA Apply 1 application topically as needed for muscle pain. To neck and shoulder  0   metFORMIN (GLUCOPHAGE) 500 MG tablet Take 500 mg by mouth daily.     metoprolol tartrate (LOPRESSOR) 25 MG tablet Take 25 mg by mouth daily.     polyethylene glycol (MIRALAX / GLYCOLAX) 17 g packet Take 17 g by mouth daily. Purchase bottle over the counter and use capful daily 14 each 0   sodium chloride  (OCEAN) 0.65 % SOLN nasal spray Place 1 spray into both nostrils in the morning, at noon, in the evening, and at bedtime.  0   Spacer/Aero-Holding Chambers (AEROCHAMBER PLUS WITH MASK) inhaler As needed with inhaler 1 each 0   vitamin B-12 (CYANOCOBALAMIN) 500 MCG tablet Take 1 tablet (500 mcg total) by mouth daily.     No facility-administered medications prior to visit.    PAST MEDICAL HISTORY: Past Medical History:  Diagnosis Date   Afib ([place])    diagnosed 05/17/19   Arthritis    BRCA2 positive 2017   COPD (chronic obstructive pulmonary disease) ([facility])    Diabetes mellitus without complication ([facility])    Diverticulosis    Dysrhythmia    Afib   Hearing loss    HTN (hypertension)    Memory loss    Pre-diabetes    Seasonal depression ([facility])    Seizure disorder, complex partial, 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[place]) seizure free for one year   Seizures [facility])    anniversary seizure - 1 year  after serizure   Stroke [facility])    05/2019   Stroke, hemorrhagic ([place]) aneurysm bleed.    Vestibulitis of ear     PAST SURGICAL HISTORY: Past Surgical History:  Procedure Laterality Date   broken ankle Right    TONSILLECTOMY     TRANSCAROTID ARTERY REVASCULARIZATION  Right 06/20/2019   TRANSCAROTID ARTERY REVASCULARIZATION  Right 06/20/2019   Procedure: RIGHT TRANSCAROTID ARTERY REVASCULARIZATION;  Surgeon: Angelia Mould, MD;  Location: [facility] OR;  Service: Vascular;  Laterality: Right;    FAMILY HISTORY: Family History  Problem Relation Age of Onset   Dementia Mother    Stroke Mother    Thyroid disease Mother    Osteoporosis Mother    Ovarian cancer Paternal Grandmother 7   Ovarian cancer Paternal Aunt 25   Breast cancer Daughter 66       breast ca x2, bilateral dx. 14, 46--estrogen   Other Daughter        no genetic testing as of 07/2015; has had ovaries removed   Other Daughter        BRCA 2 Pos.   Colon cancer Maternal Aunt        dx. unspecified age   Emphysema Maternal Uncle     High blood pressure Maternal Grandmother    Dementia Maternal Grandfather    Bleeding Disorder Maternal Grandfather    Emphysema Maternal Aunt    Emphysema Maternal Aunt     SOCIAL HISTORY: Social History   Socioeconomic History   Marital status: Married    Spouse name: [name]   Number of children: 2   Years of education: 12   Highest education level: Not on file  Occupational History   Not on file  Tobacco Use   Smoking status: Former    Packs/day: 1.00    Years: 41.00    Pack years: 41.00    Types: Cigarettes    Quit date: 01/13/2006    Years since quitting: 14.6   Smokeless tobacco: Never  Vaping Use   Vaping Use: Never used  Substance and Sexual Activity   Alcohol use: No    Alcohol/week: 0.0 standard drinks   Drug use: No   Sexual activity: Never    Partners: Male    Birth control/protection: Post-menopausal  Other Topics Concern   Not on file  Social History Narrative   Patient is married ([name]) and lives at home with her husband.   Patient is retired.   Patient has two children.   Patient has a high school education.   Patient is right-handed.   Patient drinks 2 big mugs of coffee daily and sometimes tea in the evenings.   Social Determinants of Health   Financial Resource Strain: Not on file  Food Insecurity: Not on file  Transportation Needs: Not on file  Physical Activity: Not on file  Stress: Not on file  Social Connections: Not on file  Intimate Partner Violence: Not on file      PHYSICAL EXAM  Vitals:   09/06/20 1523  BP: (!) 158/73  Pulse: 93  Weight: 261 lb (118.4 kg)  Height: '5\' 4"'  (1.626 m)   Body mass index is 44.8 kg/m.  Result History  Well groomed, but incoherent :   She has ankle edema bilaterally, she has a very hoarse and shaky voice.   Generalized: Well groomed, highy anxious. , on  3 Liters of 0xygen  for  24/7 now.    Neurological examination  Mentation: today unable to state  time, place, history . She is a   anxious. She fears her husband develops dementia. She is not coherent , very tangential, and has been all over the place with her interval report, fluent speech, slightly dysphonic. MOCA. Had to be aborted and we did MMSE.   MMSE - Mini Mental State Exam 09/06/2020 02/07/2020  Orientation to time 5 3  Orientation to Place 5 5  Registration 3 3  Attention/ Calculation 1 3  Recall 1 0  Language- name 2 objects 2 2  Language- repeat 1 1  Language- follow 3 step command 3 3  Language- read & follow direction 1 1  Write a sentence 1 1  Copy design 1 0  Total score 24 22    Pupils are  round- and remained reactive to light.  Extraocular movements were full, visual field were full on confrontational test.  Facial sensation and strength were normal.  Uvula and tongue move in midline, no swelling, no tremor.  Neck stiffness- restricted ROM-  Head turning and shoulder shrug were impaired.      SENSORY loss of Vibration and soft touchin both lower extremities. Even at the patella.   MOTOR - elevated tone, left sided spasticity and right sided cogwheeling, but good grip strength.  Coordination: delayed in finger to nose and rapid alternating movements. There is a mild action tremor.   Gait and station: Gait -She was seated today in a wheelchair, it was difficult to get her up and standing, she is unsteady, she feels weak.   Reflexes: Deep tendon reflexes are asymmetric - left spasticity, right is less brisk .  Loss of leg control, severley impaired motor strength. Stability.   DIAGNOSTIC DATA (LABS, IMAGING, TESTING) - I reviewed patient records, labs, notes, testing and imaging myself where available.  Last years stroke - left sided weakness  improving gradually in rehab- Right MCA stroke, no bleed, origin can be right carotid or embolism from atrial fib . I have not found ECHO transoesophageal , only TT-Echo.  indiaction of vascualr dementia being present. Atrial fibrillation was diagnosed,  causing SOB, ankle edema and requires her to stay on O2. Has tachycardia.  She has already been on beta blocker and is now anticoagulation , for indefinite time.    Lab Results  Component Value Date   WBC 14.7 (H) 08/02/2020   HGB 13.5 08/02/2020   HCT 41.4 08/02/2020   MCV 96 08/02/2020   PLT 317 08/02/2020     ASSESSMENT AND PLAN:   1. Small acute right MCA infarct. Spasticity on the left body corelates.   2. Chronic right temporal lobe infarct. This was a aneuyyrysm bleed, caused the brain organic psychology -  Status post [facility] after aneurysm bleed in 2008- developed post bleed anniversary Seizures, worst manifestations in 2010- now controlled on Keppra. She'll continue on Keppra 750 mg twice a day. She is now using generic Keppra-  And Neurontin at night form rehab.    3. Mild chronic small vessel ischemic disease. Age related, also oxygen dependent and therfor more at risk .   There is vascular dementia Memory disturbance, originally related to stroke, bleed and progressive Dementia- MOCA in 04/2017 to 24/ 30 points, 2021;  26/30 points. Now MMSE - declining cognition..   4. The patient lost her ability to safely ambulate in her home. She falls. Left hand has remained impaired, affecting her ability to do household acitivities. falls have caused her to change from a walker to a wheelchair. She  is anticoagulated but this is also putting her at risk.      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 She is on  3 liters 02 for 24/ 7.  I had the patient for along time use 2 mg Klonopin for seizure and insomnia, and she is now getting severely demented -  I am willing to wean her off if needed.   If her husband is truly as cognitively impaired as she stated , they really shouldn't be driving and handling financial affairs.   the couple may need a care taker to come in, but the couple's daughter in [place] has come to help with  medication, financial affairs, taxes , etc.  No driving !   Follow-up in 6-8 months again with NP for MMSE    Larey Seat, MD  09/06/2020, 3:48 PM [facility] [address], [place], [place] [address] [phone]

## 2020-09-12 ENCOUNTER — Other Ambulatory Visit: Payer: Self-pay | Admitting: Neurology

## 2020-09-12 DIAGNOSIS — F09 Unspecified mental disorder due to known physiological condition: Secondary | ICD-10-CM

## 2020-09-12 DIAGNOSIS — G40409 Other generalized epilepsy and epileptic syndromes, not intractable, without status epilepticus: Secondary | ICD-10-CM

## 2020-09-12 DIAGNOSIS — I609 Nontraumatic subarachnoid hemorrhage, unspecified: Secondary | ICD-10-CM

## 2020-09-12 MED ORDER — CLONAZEPAM 2 MG PO TABS: ORAL_TABLET | ORAL | 0 refills | Status: DC

## 2020-09-12 NOTE — Telephone Encounter (Signed)
Pt called stating that her clonazePAM (KLONOPIN) 2 MG tablet was not called in to [facility]. Pt would like to be called once this is done. Please advise.

## 2020-09-12 NOTE — Telephone Encounter (Signed)
I have routed this request to [place] for review. The pt is due for the medication and [place] registry was verified.  

## 2020-09-25 ENCOUNTER — Other Ambulatory Visit: Payer: Self-pay

## 2020-09-25 MED ORDER — MEMANTINE HCL 5 MG PO TABS: 5.0000 mg | ORAL_TABLET | Freq: Two times a day (BID) | ORAL | 6 refills | Status: DC

## 2020-10-23 DIAGNOSIS — I4891 Unspecified atrial fibrillation: Secondary | ICD-10-CM | POA: Diagnosis not present

## 2020-10-23 DIAGNOSIS — Z7901 Long term (current) use of anticoagulants: Secondary | ICD-10-CM | POA: Diagnosis not present

## 2020-10-23 DIAGNOSIS — K625 Hemorrhage of anus and rectum: Secondary | ICD-10-CM | POA: Diagnosis not present

## 2020-10-23 DIAGNOSIS — K5790 Diverticulosis of intestine, part unspecified, without perforation or abscess without bleeding: Secondary | ICD-10-CM | POA: Diagnosis not present

## 2020-10-30 ENCOUNTER — Ambulatory Visit (INDEPENDENT_AMBULATORY_CARE_PROVIDER_SITE_OTHER): Payer: Medicare Other | Admitting: Primary Care

## 2020-10-30 ENCOUNTER — Encounter: Payer: Self-pay | Admitting: Primary Care

## 2020-10-30 ENCOUNTER — Other Ambulatory Visit: Payer: Self-pay

## 2020-10-30 VITALS — BP 118/64 | HR 89 | Temp 97.8°F | Ht 64.0 in | Wt 260.0 lb

## 2020-10-30 DIAGNOSIS — J441 Chronic obstructive pulmonary disease with (acute) exacerbation: Secondary | ICD-10-CM | POA: Diagnosis not present

## 2020-10-30 DIAGNOSIS — J301 Allergic rhinitis due to pollen: Secondary | ICD-10-CM

## 2020-10-30 DIAGNOSIS — J42 Unspecified chronic bronchitis: Secondary | ICD-10-CM | POA: Diagnosis not present

## 2020-10-30 DIAGNOSIS — R29898 Other symptoms and signs involving the musculoskeletal system: Secondary | ICD-10-CM | POA: Diagnosis not present

## 2020-10-30 MED ORDER — PREDNISONE 10 MG PO TABS: ORAL_TABLET | ORAL | 0 refills | Status: DC

## 2020-10-30 MED ORDER — FEXOFENADINE HCL 60 MG PO TABS: 60.0000 mg | ORAL_TABLET | Freq: Every day | ORAL | 3 refills | Status: DC

## 2020-10-30 NOTE — Assessment & Plan Note (Addendum)
-   Breathing is mostly stable, she does have a chronic congested cough and had some mild wheezing on exam today. She has been on several different inhaler in the past including Pulmicort, Anoro and Breztri. She prefers Advair, we will keep her on this. Recommend she use HFA inhaler with spacer. Plan increase mucinex 1200mg  BID and add flutter valve TID. Sending in low dose prednisone 20mg  x 5 days. Follow-up in 3 months with Dr. Elsworth Soho or sooner if needed (may help for her to bring in medication to her next visit to sort out)

## 2020-10-30 NOTE — Assessment & Plan Note (Signed)
-   Advised patient to follow-up with PCP next week and neurology

## 2020-10-30 NOTE — Assessment & Plan Note (Signed)
-   Start Allegra 60mg  at bedtime, if patient develops drowsiness advised her to stop taking - Continue Flonase nasal spray and saline nasal rinses

## 2020-10-30 NOTE — Progress Notes (Signed)
_0  ID: Beth [name], female    DOB: [birth year], 80 y.o.   MRN: [id number]  Chief Complaint  Patient presents with   Follow-up    Follow up on COPD    Referring provider: Kathyrn Lass, MD  HPI: 80 year old female, former smoker. PMH significant for COPD, chronic respiratory failure O2 dependent, permanent afib, HTN, organic brain syndrome, stroke, BRCA2 positive, obesity.  Patient of Dr. Elsworth Soho. Currently maintained on Breztri.  Previous [facility] pulmonary encounters: 05/02/2019 Patient presents today for regular follow-up. Last seen for video visit in September. Her breathing has been ok as long as she takes it easy. She gets winded when she rushes. She is wearing 2L oxygen. She denies any low oxygen levels at home. Right now the pollen has been causing her to have some nasal congestion/post nasal drip and mucus production getting stuck in the back of her throat. She just started taking Claritin. She has not tried nasal sprays but has simple saline and AYR at home. She would like to try going back to [facility], states that she experience some side effects with Anoro. Irregular heart beat, eye floaters and does not like the dry power. These symptoms do not last long. She saw cardiology in October 2020. She has tried pulmonary rehab in the past but is not interested in returning at this time.   11/01/2019 Patient presents today for 6 month follow-up. Since last visit she was hospitalized for Afib, right MCA stroke and right carotid stenosis in June. She has completed rehab, feels she has progressed well. Still struggles with left hand weakness/numbness and left foot. She still drops things fairly easily. She is unable to do anything that requires two hands at once. Her husband has to help her use Stiolto inhaler. She takes Stiolto at 10am and needs to use her Albuterol around 7pm at night. She has cough with mucus production. Associated shortness of breath and wheezing.  She has not previously  tolerate oral prednisone in the past d/t mood changes, she is unsure if this was because of other medication she was taking at time and is open to trying steriod inhalers.   12/27/19- Dr. Elsworth Soho 80 year old female, former smoker for FU of COPD  PMH significant for  chronic respiratory failure, HTN, organic brain syndrome, stroke, BRCA2 positive, Obesity. 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  Subsequent seizures. Has some word finding issues and cognitive deficits with emotional lability   I last saw her in 2019 Last office visit 10/2019 due to difficulty taking anoro/Stiolto inhaler, she was changed to Pulmicort and DuoNebs She is able to use this regimen, also using albuterol MDI in between, wonders if we can get her on an easier regimen. She arrives using a walker and portable oxygen.  Halting speech Having a bad breathing day today Her immunizations are up-to-date   04/03/2020  Patient presents today for 3 month follow-up. During last visit in December 2021 she was given trial of Breztri. She was previously on Pulmicort nebulizer + Duonebs d/t stroke. She had a routine chest x-ray on 12/28/2019 showed coarse bibasilar interstitial opacities, more conspicuous than prior study.  This was independently reviewed by Dr. Elsworth Soho, no new infiltrates or effusion, left upper lobe calcified granuloma is prior.  She is doing alright today. She reports being more shortness of breath in the afternoons. She does not feel Breztri inhaler  is lasting long enough. Her breathing is pretty good in the morning. She takes her medications around 11am. She experiences shortness of breath approx 6 hours later around 5pm in the afternoon. She will use her Proair rescue inhaler which does help. She takes second dose of Breztri around 11pm.   She is currently dealing with allergy symptoms including nasal congestion and PND. She is  using simply saline twice a day and Flonase nasal spray. She has a congested cough with difficulty getting mucus up. At times she gets winded walking to bathroom. She is on 2-3L oxygen continuously, she may briefly increase her oxygen when dyspneic. She ambulates with a walker. She does not have much strength in her left hand since stroke. She has difficulty opening containers.   07/04/2020  Patient presents today for 3 month follow-up. She had a hard time getting to her appointment today. She had an episode of shortness of breath at home today. She notes that her oxygen was 92% on 2.5L and HR was 122. She has a history of permanent afib. She has trouble with her breathing in hot/humid weather. She currently has a cough and allergy symptoms. She is using BREZTRI twice a day. She is on 3L oxygen. She is still having bladder problems, states that she has difficulty starting stream and has some dribbling. She has been seen by Urology in the past.    10/30/2020 - Interim hx  Patient presents today for 3-4 month follow-up. She is doing alright breathing wise. She has new leg weakness, states that when she sits on a toilet seat her upper thighs will go numb.  She gets weak walking around the house. She is in a wheel chair today. She follows with Dr. Brett Fairy for vascular dementia, . She also has had some rectal bleeding and PCP advised her to start a clear liquid diet. She will be following up with them next week for annual exam.  In terms of her COPD, her breathing is mostly stable. She has a cough felt to be related to allergies. She is on Advair. Usually uses albuterol inhaler once a day in the afternoon. She is on 3L oxygen continuously. She can not tolerate oral antihistamines d/t drowsiness. She did not tolerated Breztri d/t reported urinary retention. She has been using flonase at bedtime and uses saline nasal rinses during the day. She takes mucinex twice a day.    Significant tests/ events CT angio  chest 07/2015 >> no pulmonary embolism, no emphysema, calcified mediastinal LNs PFTs 09/2015>> severe airway obstruction with FEV1 40%, improves to 47% with albuterol, DLCO decreased to 45%    Allergies  Allergen Reactions   Prozac [Fluoxetine Hcl] Other (See Comments)    seizures   Sulfa Antibiotics Nausea And Vomiting and Other (See Comments)    TINGLING IN LEGS ALSO   Micardis [Telmisartan] Swelling and Other (See Comments)    Swelling, bruising Swelling, bruising   Prednisone Other (See Comments)    mood changes Mood changes   Lisinopril-Hydrochlorothiazide Other (See Comments)    unknown   Augmentin [Amoxicillin-Pot Clavulanate] Rash   Avelox [Moxifloxacin Hcl In Nacl] Rash    LEVAQUIN IS OK-SEE NOTE   Norvasc [Amlodipine Besylate] Other (See Comments)    fatigue    Immunization History  Administered Date(s) Administered   Influenza Split 01/03/2008, 11/06/2008   Influenza, High Dose Seasonal PF 11/22/2013, 11/27/2014, 10/28/2016, 10/27/2017, 09/14/2018, 10/24/2019   Influenza,inj,Quad PF,6+ Mos 10/19/2018   Influenza-Unspecified 10/08/2015   PFIZER(Purple Top)SARS-COV-2 Vaccination  02/02/2019, 02/23/2019, 11/26/2019   Pneumococcal Conjugate-13 11/22/2013   Pneumococcal Polysaccharide-23 05/05/2008   Td 05/22/2003   Tdap 08/20/2012   Zoster, Live 08/20/2012    Past Medical History:  Diagnosis Date   Afib ([place])    diagnosed 05/17/19   Arthritis    BRCA2 positive 2017   COPD (chronic obstructive pulmonary disease) ([place])    Diabetes mellitus without complication ([place])    Diverticulosis    Dysrhythmia    Afib   Hearing loss    HTN (hypertension)    Memory loss    Pre-diabetes    Seasonal depression ([place])    Seizure disorder, complex partial, 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    Vestibulitis of ear      Tobacco History: Social History   Tobacco Use  Smoking Status Former   Packs/day: 1.00   Years: 41.00   Pack years: 41.00   Types: Cigarettes   Quit date: 01/13/2006   Years since quitting: 14.8  Smokeless Tobacco Never   Counseling given: Not Answered   Outpatient Medications Prior to Visit  Medication Sig Dispense Refill   acetaminophen (TYLENOL) 325 MG tablet Take 1-2 tablets (325-650 mg total) by mouth every 4 (four) hours as needed for mild pain.     albuterol (PROAIR HFA) 108 (90 Base) MCG/ACT inhaler Inhale 2 puffs into the lungs every 6 (six) hours as needed for wheezing or shortness of breath. 24 g 1   aspirin EC 81 MG tablet Take 81 mg by mouth daily.     atorvastatin (LIPITOR) 20 MG tablet TAKE 1 TABLET DAILY 90 tablet 3   captopril (CAPOTEN) 50 MG tablet Take 50 mg by mouth 2 (two) times daily.     Cholecalciferol (VITAMIN D3) 2000 units capsule Take 2,000 Units by mouth daily.      clonazePAM (KLONOPIN) 2 MG tablet Take 1 tablet at bedtime (seizure and insomnia) may take additional 0.5 tablet if necessary for panic attack. Our goal is to try and slowly wean down to no more than 1 tab a day. 135 tablet 0   diltiazem (CARDIZEM CD) 360 MG 24 hr capsule Take 1 capsule (360 mg total) by mouth daily. 90 capsule 3   ELIQUIS 5 MG TABS tablet TAKE 1 TABLET TWICE A DAY 180 tablet 3   famotidine (PEPCID) 20 MG tablet Take 1 tablet (20 mg total) by mouth 2 (two) times daily. 60 tablet 0   fluticasone (FLONASE) 50 MCG/ACT nasal spray Place 1 spray into the nose daily as needed for allergies. Spray twice daily as needed     Fluticasone-Salmeterol (ADVAIR HFA IN) Inhale 2 puffs into the lungs in the morning and at bedtime.     gabapentin (NEURONTIN) 600 MG tablet Take 0.5 tablets (300 mg total) by mouth at bedtime. 45 tablet 3   hydrocerin (EUCERIN) CREA Apply 1 application topically 2 (two) times daily.  0   ipratropium-albuterol (DUONEB) 0.5-2.5 (3) MG/3ML SOLN 3 ml as needed      levETIRAcetam (KEPPRA) 750 MG tablet Take 1 tablet (750 mg total) by mouth 2 (two) times daily. 180 tablet 3   LEXAPRO 20 MG tablet Take 1 tablet (20 mg total) by mouth daily. Morning dosage 90 tablet 3   memantine (NAMENDA) 5 MG tablet Take 1 tablet (5 mg total)  by mouth 2 (two) times daily. 60 tablet 6   Menthol-Methyl Salicylate (MUSCLE RUB) 10-15 % CREA Apply 1 application topically as needed for muscle pain. To neck and shoulder  0   metFORMIN (GLUCOPHAGE) 500 MG tablet Take 500 mg by mouth daily.     metoprolol tartrate (LOPRESSOR) 25 MG tablet Take 25 mg by mouth daily.     polyethylene glycol (MIRALAX / GLYCOLAX) 17 g packet Take 17 g by mouth daily. Purchase bottle over the counter and use capful daily 14 each 0   sodium chloride (OCEAN) 0.65 % SOLN nasal spray Place 1 spray into both nostrils in the morning, at noon, in the evening, and at bedtime.  0   Spacer/Aero-Holding Chambers (AEROCHAMBER PLUS WITH MASK) inhaler As needed with inhaler 1 each 0   vitamin B-12 (CYANOCOBALAMIN) 500 MCG tablet Take 1 tablet (500 mcg total) by mouth daily.     Budeson-Glycopyrrol-Formoterol 160-9-4.8 MCG/ACT AERO Inhale 2 puffs into the lungs in the morning and at bedtime.     budesonide (PULMICORT) 0.25 MG/2ML nebulizer solution 2 ml     loratadine (CLARITIN) 10 MG tablet Take 1 tablet (10 mg total) by mouth daily.     No facility-administered medications prior to visit.    Review of Systems  Review of Systems  Respiratory:  Positive for cough.     Physical Exam  BP 118/64 (BP Location: Right Arm, Patient Position: Sitting, Cuff Size: Normal)   Pulse 89   Temp 97.8 F (36.6 C) (Oral)   Ht _0  (1.626 m)   Wt 260 lb (117.9 kg)   LMP 01/14/1995 (Approximate)   SpO2 100%   BMI 44.63 kg/m  Physical Exam Constitutional:      Appearance: Normal appearance.  HENT:     Head: Normocephalic and atraumatic.  Cardiovascular:     Rate and Rhythm: Normal rate.  Pulmonary:     Effort:  Pulmonary effort is normal.     Breath sounds: Wheezing present.     Comments: Upper airway congestion Musculoskeletal:        General: Normal range of motion.     Comments: In [facility]  Neurological:     Mental Status: She is alert.  Psychiatric:        Mood and Affect: Mood normal.        Behavior: Behavior normal.        Thought Content: Thought content normal.        Judgment: Judgment normal.     Lab Results:  CBC    Component Value Date/Time   WBC 14.7 (H) 08/02/2020 1221   WBC 10.2 11/21/2019 1134   RBC 4.33 08/02/2020 1221   RBC 4.11 11/21/2019 1134   HGB 13.5 08/02/2020 1221   HCT 41.4 08/02/2020 1221   PLT 317 08/02/2020 1221   MCV 96 08/02/2020 1221   MCH 31.2 08/02/2020 1221   MCH 30.9 11/21/2019 1134   MCHC 32.6 08/02/2020 1221   MCHC 31.9 11/21/2019 1134   RDW 12.9 08/02/2020 1221   LYMPHSABS 2.1 11/21/2019 1134   MONOABS 0.9 11/21/2019 1134   EOSABS 0.2 11/21/2019 1134   BASOSABS 0.1 11/21/2019 1134    BMET    Component Value Date/Time   NA 146 (H) 08/02/2020 1221   K 4.3 08/02/2020 1221   CL 99 08/02/2020 1221   CO2 27 08/02/2020 1221   GLUCOSE 109 (H) 08/02/2020 1221   GLUCOSE 112 (H) 11/21/2019 1134   BUN 13 08/02/2020 1221  CREATININE 0.67 08/02/2020 1221   CREATININE 0.85 02/22/2018 1051   CALCIUM 9.7 08/02/2020 1221   GFRNONAA 59 (L) 11/21/2019 1134   GFRNONAA >60 02/22/2018 1051   GFRAA >60 06/20/2019 0657   GFRAA >60 02/22/2018 1051    BNP    Component Value Date/Time   BNP 24.5 07/30/2015 2103    ProBNP No results found for: PROBNP  Imaging: No results found.   Assessment & Plan:   COPD (chronic obstructive pulmonary disease) (H[facility]) - Breathing is mostly stable, she does have a chronic congested cough and had some mild wheezing on exam today. She has been on several different inhaler in the past including Pulmicort, Anoro and Breztri. She prefers Advair, we will keep her on this. Recommend she use HFA inhaler with spacer.  Plan increase mucinex 128m BID and add flutter valve TID. Sending in low dose prednisone 243mx 5 days. Follow-up in 3 months with Dr. AlElsworth Sohor sooner if needed (may help for her to bring in medication to her next visit to sort out)  Allergic rhinitis - Start Allegra 6038mt bedtime, if patient develops drowsiness advised her to stop taking - Continue Flonase nasal spray and saline nasal rinses  Leg weakness - Advised patient to follow-up with PCP next week and neurology   - 40 mins spent on case; > 50% face to face   EliMartyn EhrichP 10/30/2020

## 2020-10-30 NOTE — Patient Instructions (Addendum)
Recommendations: - Continue Advair 2 puffs morning and evening (with spacer) - Start Allegra 60mg  at bedtime, if you develop drowsiness stop taking - I would stay away sudafed - Continue Flonase nasal spray and saline nasal rinses - Continue Mucinex 1200mg  twice a day  - Start flutter valve three times a day to help clear mucus   Orders: - Flutter valve   Rx: - Prednisone 20mg  x 5 days (please confirm no allergy)  Follow-up: - 3-4 months with Dr. Elsworth Soho

## 2020-11-05 DIAGNOSIS — R55 Syncope and collapse: Secondary | ICD-10-CM | POA: Diagnosis not present

## 2020-11-05 DIAGNOSIS — E1169 Type 2 diabetes mellitus with other specified complication: Secondary | ICD-10-CM | POA: Diagnosis not present

## 2020-11-05 DIAGNOSIS — I6930 Unspecified sequelae of cerebral infarction: Secondary | ICD-10-CM | POA: Diagnosis not present

## 2020-11-05 DIAGNOSIS — I4891 Unspecified atrial fibrillation: Secondary | ICD-10-CM | POA: Diagnosis not present

## 2020-11-05 DIAGNOSIS — Z23 Encounter for immunization: Secondary | ICD-10-CM | POA: Diagnosis not present

## 2020-11-05 DIAGNOSIS — W19XXXA Unspecified fall, initial encounter: Secondary | ICD-10-CM | POA: Diagnosis not present

## 2020-11-05 DIAGNOSIS — D72829 Elevated white blood cell count, unspecified: Secondary | ICD-10-CM | POA: Diagnosis not present

## 2020-11-07 ENCOUNTER — Telehealth: Payer: Self-pay

## 2020-11-07 ENCOUNTER — Other Ambulatory Visit: Payer: Self-pay | Admitting: Family Medicine

## 2020-11-07 DIAGNOSIS — K5790 Diverticulosis of intestine, part unspecified, without perforation or abscess without bleeding: Secondary | ICD-10-CM

## 2020-11-07 DIAGNOSIS — R109 Unspecified abdominal pain: Secondary | ICD-10-CM

## 2020-11-07 DIAGNOSIS — D72119 Hypereosinophilic syndrome (hes), unspecified: Secondary | ICD-10-CM

## 2020-11-07 NOTE — Telephone Encounter (Signed)
NOTES SCANNED TO REFERRAL 

## 2020-11-13 DIAGNOSIS — E119 Type 2 diabetes mellitus without complications: Secondary | ICD-10-CM | POA: Diagnosis not present

## 2020-11-13 DIAGNOSIS — E7800 Pure hypercholesterolemia, unspecified: Secondary | ICD-10-CM | POA: Diagnosis not present

## 2020-11-13 DIAGNOSIS — M47892 Other spondylosis, cervical region: Secondary | ICD-10-CM | POA: Diagnosis not present

## 2020-11-13 DIAGNOSIS — G40909 Epilepsy, unspecified, not intractable, without status epilepticus: Secondary | ICD-10-CM | POA: Diagnosis not present

## 2020-11-13 DIAGNOSIS — E559 Vitamin D deficiency, unspecified: Secondary | ICD-10-CM | POA: Diagnosis not present

## 2020-11-13 DIAGNOSIS — I4891 Unspecified atrial fibrillation: Secondary | ICD-10-CM | POA: Diagnosis not present

## 2020-11-13 DIAGNOSIS — J441 Chronic obstructive pulmonary disease with (acute) exacerbation: Secondary | ICD-10-CM | POA: Diagnosis not present

## 2020-11-13 DIAGNOSIS — F32A Depression, unspecified: Secondary | ICD-10-CM | POA: Diagnosis not present

## 2020-11-13 DIAGNOSIS — I69354 Hemiplegia and hemiparesis following cerebral infarction affecting left non-dominant side: Secondary | ICD-10-CM | POA: Diagnosis not present

## 2020-11-13 DIAGNOSIS — K5790 Diverticulosis of intestine, part unspecified, without perforation or abscess without bleeding: Secondary | ICD-10-CM | POA: Diagnosis not present

## 2020-11-13 DIAGNOSIS — I10 Essential (primary) hypertension: Secondary | ICD-10-CM | POA: Diagnosis not present

## 2020-11-13 DIAGNOSIS — R55 Syncope and collapse: Secondary | ICD-10-CM | POA: Diagnosis not present

## 2020-11-20 ENCOUNTER — Other Ambulatory Visit: Payer: Medicare Other

## 2020-11-20 ENCOUNTER — Ambulatory Visit: Payer: Medicare Other | Admitting: Oncology

## 2020-11-21 NOTE — Progress Notes (Signed)
Cardiology Office Note    Date:  11/28/2020   ID:  Beth [name], DOB 12/08/1940, MRN [id number]   PCP:  Kathyrn Lass, MD   [place]  Cardiologist:  Larae Grooms, MD   Advanced Practice Provider:  No care team member to display Electrophysiologist:  None   [id number]   Chief Complaint  Patient presents with   Loss of Consciousness    History of Present Illness:  Beth [name] is a 80 y.o. 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  COPD, hypertension.  Patient saw Dr. Irish Lack 08/02/2020 doing well but HR up so diltiazem increased 360 mg daily.  Patient referred to [place] from Dr. Kathyrn Lass for syncope 11/07/2020, CMET and CBC stable.  Patient comes in with her husband and they are not familiar with her meds. Their daughter manages her meds. In mid Oct she and her husband were in a small space with her walker. She stepped back away from her walker and lost her balance and fell backwards into the glass of a grandfather clock. She his hard on her spine. A few days later while on the toilet she had hurting and numbness in her legs and became dizzy but didn't consciousness according to her but her husband thinks she may have passed out. He wasn't there. Toilet seat is uncomfortable to her and gets very hot in her BR when the heat is on-like a sauna. Denied chest pain, edema. Always short of breath and on O2. Walking with PT but in a wheelchair. Sometimes heart races and irreg and she gets breathless. 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    Vestibulitis of ear     Past Surgical History:  Procedure Laterality Date   broken ankle Right    TONSILLECTOMY     TRANSCAROTID ARTERY REVASCULARIZATION  Right 06/20/2019   TRANSCAROTID ARTERY REVASCULARIZATION  Right 06/20/2019   Procedure: RIGHT TRANSCAROTID ARTERY REVASCULARIZATION;  Surgeon: Angelia Mould, MD;  Location: [facility] OR;  Service: Vascular;  Laterality: Right;    Current Medications: Current Meds  Medication Sig   acetaminophen (TYLENOL) 325 MG tablet Take 1-2 tablets (325-650 mg total) by mouth every 4 (four) hours as needed for mild pain.   albuterol (PROAIR HFA) 108 (90 Base) MCG/ACT inhaler Inhale 2 puffs into the lungs every 6 (six) hours as needed for wheezing or shortness of breath.   aspirin EC 81 MG tablet Take 81 mg by mouth daily.   atorvastatin (LIPITOR) 20 MG tablet TAKE 1 TABLET DAILY   captopril (CAPOTEN) 50 MG tablet Take 50  mg by mouth 2 (two) times daily.   Cholecalciferol (VITAMIN D3) 2000 units capsule Take 2,000 Units by mouth daily.    clonazePAM (KLONOPIN) 2 MG tablet Take 1 tablet at bedtime (seizure and insomnia) may take additional 0.5 tablet if necessary for panic attack. Our goal is to try and slowly wean down to no more than 1 tab a day.   diltiazem (CARDIZEM CD) 360 MG 24 hr capsule Take 1 capsule (360 mg total) by mouth daily.   ELIQUIS 5 MG TABS tablet TAKE 1 TABLET TWICE A DAY   famotidine (PEPCID) 20 MG tablet Take 1 tablet (20 mg total) by mouth 2 (two) times daily.   fexofenadine (ALLEGRA ALLERGY) 60 MG tablet Take 1 tablet (60 mg total)  by mouth daily.   fluticasone (FLONASE) 50 MCG/ACT nasal spray Place 1 spray into the nose daily as needed for allergies. Spray twice daily as needed   Fluticasone-Salmeterol (ADVAIR HFA IN) Inhale 2 puffs into the lungs in the morning and at bedtime.   gabapentin (NEURONTIN) 600 MG tablet Take 0.5 tablets (300 mg total) by mouth at bedtime.   hydrocerin (EUCERIN) CREA Apply 1 application topically 2 (two) times daily.   ipratropium-albuterol (DUONEB) 0.5-2.5 (3) MG/3ML SOLN 3 ml as needed   levETIRAcetam (KEPPRA) 750 MG tablet Take 1 tablet (750 mg total) by mouth 2 (two) times daily.   LEXAPRO 20 MG tablet Take 1 tablet (20 mg total) by mouth daily. Morning dosage   memantine (NAMENDA) 5 MG tablet Take 1 tablet (5 mg total) by mouth 2 (two) times daily.   Menthol-Methyl Salicylate (MUSCLE RUB) 10-15 % CREA Apply 1 application topically as needed for muscle pain. To neck and shoulder   metFORMIN (GLUCOPHAGE) 500 MG tablet Take 500 mg by mouth daily.   metoprolol tartrate (LOPRESSOR) 25 MG tablet Take 25 mg by mouth daily.   polyethylene glycol (MIRALAX / GLYCOLAX) 17 g packet Take 17 g by mouth daily. Purchase bottle over the counter and use capful daily   predniSONE (DELTASONE) 10 MG tablet Take 2 tabs x 5 days   sodium chloride (OCEAN) 0.65 % SOLN nasal spray Place 1 spray into both nostrils in the morning, at noon, in the evening, and at bedtime.   Spacer/Aero-Holding Chambers (AEROCHAMBER PLUS WITH MASK) inhaler As needed with inhaler   vitamin B-12 (CYANOCOBALAMIN) 500 MCG tablet Take 1 tablet (500 mcg total) by mouth daily.     Allergies:   Prozac [fluoxetine hcl], Sulfa antibiotics, Micardis [telmisartan], Prednisone, Lisinopril-hydrochlorothiazide, Augmentin [amoxicillin-pot clavulanate], Avelox [moxifloxacin hcl in nacl], and Norvasc [amlodipine besylate]   Social History   Socioeconomic History   Marital status: Married    Spouse name: [name]   Number of children: 2   Years of  education: 12   Highest education level: Not on file  Occupational History   Not on file  Tobacco Use   Smoking status: Former    Packs/day: 1.00    Years: 41.00    Pack years: 41.00    Types: Cigarettes    Quit date: 01/13/2006    Years since quitting: 14.8   Smokeless tobacco: Never  Vaping Use   Vaping Use: Never used  Substance and Sexual Activity   Alcohol use: No    Alcohol/week: 0.0 standard drinks   Drug use: No   Sexual activity: Never    Partners: Male    Birth control/protection: Post-menopausal  Other Topics Concern   Not on file  Social History Narrative  Patient is married ([name]) and lives at home with her husband.   Patient is retired.   Patient has two children.   Patient has a high school education.   Patient is right-handed.   Patient drinks 2 big mugs of coffee daily and sometimes tea in the evenings.   Social Determinants of Health   Financial Resource Strain: Not on file  Food Insecurity: Not on file  Transportation Needs: Not on file  Physical Activity: Not on file  Stress: Not on file  Social Connections: Not on file     Family History:  The patient's  family history includes Bleeding Disorder in her maternal grandfather; Breast cancer (age of onset: 34) in her daughter; Colon cancer in her maternal aunt; Dementia in her maternal grandfather and mother; Emphysema in her maternal aunt, maternal aunt, and maternal uncle; High blood pressure in her maternal grandmother; Osteoporosis in her mother; Other in her daughter and daughter; Ovarian cancer (age of onset: 39) in her paternal grandmother; Ovarian cancer (age of onset: 84) in her paternal aunt; Stroke in her mother; Thyroid disease in her mother.   ROS:   Please see the history of present illness.    ROS All other systems reviewed and are negative.   PHYSICAL EXAM:   VS:  BP 110/72   Pulse 99   Ht '5\' 4"'  (1.626 m)   LMP 01/14/1995 (Approximate)   SpO2 100%   BMI 44.63 kg/m   Physical  Exam  GEN: Obese on O2,  in no acute distress  Neck: no JVD, carotid bruits, or masses Cardiac:irreg irreg no murmurs, rubs, or gallops  Respiratory:  clear to auscultation bilaterally, normal work of breathing GI: soft, nontender, nondistended, + BS Ext: without cyanosis, clubbing, or edema, Good distal pulses bilaterally Neuro:  Alert and Oriented x 3,  Psych: euthymic mood, full affect  Wt Readings from Last 3 Encounters:  10/30/20 260 lb (117.9 kg)  09/06/20 261 lb (118.4 kg)  08/02/20 261 lb 3.2 oz (118.5 kg)      Studies/Labs Reviewed:   EKG:  EKG is  ordered today.  The ekg ordered today demonstrates Afib with controlled rate.   Recent Labs: 08/02/2020: BUN 13; Creatinine, Ser 0.67; Hemoglobin 13.5; Platelets 317; Potassium 4.3; Sodium 146   Lipid Panel    Component Value Date/Time   CHOL 151 05/17/2019 0346   TRIG 172 (H) 05/17/2019 0346   HDL 39 (L) 05/17/2019 0346   CHOLHDL 3.9 05/17/2019 0346   VLDL 34 05/17/2019 0346   LDLCALC 78 05/17/2019 0346    Additional studies/ records that were reviewed today include:   Carotid doppler 05/2020 Summary:  Right Carotid: The ECA appears >50% stenosed. Patent stetnt.   Left Carotid: Velocities in the left ICA are consistent with a 1-39%  stenosis.   Vertebrals: Bilateral vertebral arteries demonstrate antegrade flow.  Subclavians: Normal flow hemodynamics were seen in bilateral subclavian               arteries.   *See table(s) above for measurements and observations.      Risk Assessment/Calculations:    CHA2DS2-VASc Score = 7   This indicates a 11.2% annual risk of stroke. The patient's score is based upon: CHF History: 0 HTN History: 1 Diabetes History: 1 Stroke History: 2 Vascular Disease History: 0 Age Score: 2 Gender Score: 1       ASSESSMENT:    1. Syncope, unspecified syncope type   2. Permanent atrial fibrillation with rapid ventricular  response ([place])   3. Primary hypertension   4. Other  hyperlipidemia   5. SAH (subarachnoid hemorrhage) (HCC)      PLAN:  In order of problems listed above:  Syncope in the setting of sitting on the toilet-uncomfortable pain in her legs, very hot and humid and not sure if she was straining or not. She denies total loss of consciousness but just got dizzy and resolved quickly. No recurrence since. Suspect vasovagal but she does have Afib with some fast rates at times so will place Zio monitor. Labs done by PCP were stable, carotids stable in 06/2020. Need her meds verified by her daughter  PAF on Eliquis diltiazem increased to 360 daily 07/2020 and on metoprolol 25 mg bid. Some fast rates at times no higher than 117/m according to patient. She can tell when she's in Afib. Place Zio monitor-may need to increase metoprolol  Hypertension BP controlled but med lists says captopril  Hyperlipidemia LDL 66 04/2020  PAD on a statin  Lower extremity edema-no edema today  History of hemorrhagic stroke    Shared Decision Making/Informed Consent        Medication Adjustments/Labs and Tests Ordered: Current medicines are reviewed at length with the patient today.  Concerns regarding medicines are outlined above.  Medication changes, Labs and Tests ordered today are listed in the Patient Instructions below. Patient Instructions  Medication Instructions:  Your physician recommends that you continue on your current medications as directed. Please refer to the Current Medication list given to you today.  *If you need a refill on your cardiac medications before your next appointment, please call your pharmacy*   Lab Work: None If you have labs (blood work) drawn today and your tests are completely normal, you will receive your results only by: [place] (if you have MyChart) OR A paper copy in the mail If you have any lab test that is abnormal or we need to change your treatment, we will call you to review the  results.   Testing/Procedures: Bryn [place]ull[place]- Long Term Monitor Instructions  Your physician has requested you wear a ZIO patch monitor for 14 days.  This is a single patch monitor. [employer] supplies one patch monitor per enrollment. Additional stickers are not available. Please do not apply patch if you will be having a Nuclear Stress Test,  Echocardiogram, Cardiac CT, MRI, or Chest Xray during the period you would be wearing the  monitor. The patch cannot be worn during these tests. You cannot remove and re-apply the  ZIO XT patch monitor.  Your ZIO patch monitor will be mailed 3 day USPS to your address on file. It may take 3-5 days  to receive your monitor after you have been enrolled.  Once you have received your monitor, please review the enclosed instructions. Your monitor  has already been registered assigning a specific monitor serial # to you.  Billing and Patient Assistance Program Information  We have supplied [employer] with any of your insurance information on file for billing purposes. [employer] offers a sliding scale Patient Assistance Program for patients that do not have  insurance, or whose insurance does not completely cover the cost of the ZIO monitor.  You must apply for the Patient Assistance Program to qualify for this discounted rate.  To apply, please call [employer] at [phone], select option 4, select option 2, ask to apply for  Patient Assistance Program. Theodore Demark will ask your household income, and how many people  are in your household.  They will quote your out-of-pocket cost based on that information.  [employer] will also be able to set up a 3-month interest-free payment plan if needed.  Applying the monitor   Shave hair from upper left chest.  Hold abrader disc by orange tab. Rub abrader in 40 strokes over the upper left chest as  indicated in your monitor instructions.  Clean area with 4 enclosed alcohol pads. Let dry.  Apply patch as indicated in monitor  instructions. Patch will be placed under collarbone on left  side of chest with arrow pointing upward.  Rub patch adhesive wings for 2 minutes. Remove white label marked "1". Remove the white  label marked "2". Rub patch adhesive wings for 2 additional minutes.  While looking in a mirror, press and release button in center of patch. A small green light will  flash 3-4 times. This will be your only indicator that the monitor has been turned on.  Do not shower for the first 24 hours. You may shower after the first 24 hours.  Press the button if you feel a symptom. You will hear a small click. Record Date, Time and  Symptom in the Patient Logbook.  When you are ready to remove the patch, follow instructions on the last 2 pages of Patient  Logbook. Stick patch monitor onto the last page of Patient Logbook.  Place Patient Logbook in the blue and white box. Use locking tab on box and tape box closed  securely. The blue and white box has prepaid postage on it. Please place it in the mailbox as  soon as possible. Your physician should have your test results approximately 7 days after the  monitor has been [facility]  Call IBudat [phone] you have questions regarding  your ZIO XT patch monitor. Call them immediately if you see an orange light blinking on your  monitor.  If your monitor falls off in less than 4 days, contact our Monitor department at [phone]  If your monitor becomes loose or falls off after 4 days call [employer] at [phone]  suggestions on securing your monitor    Follow-Up: [facility] you and your health needs are our priority.  As part of our continuing mission to provide you with exceptional heart care, we have created designated Provider Care Teams.  These Care Teams include your primary Cardiologist (physician) and Advanced Practice Providers (APPs -  Physician Assistants and Nurse Practitioners) who all  work together to provide you with the care you need, when you need it.   Your next appointment:   6-8 week(s)  The format for your next appointment:   In Person  Provider:   JLarae Grooms MD {     Signed, MErmalinda Barrios PA-C  11/28/2020 2:36 PM    [facility] [address]  [id number]: [phone] Fax: [phone]

## 2020-11-28 ENCOUNTER — Encounter: Payer: Self-pay | Admitting: Oncology

## 2020-11-28 ENCOUNTER — Ambulatory Visit (INDEPENDENT_AMBULATORY_CARE_PROVIDER_SITE_OTHER): Payer: Medicare Other

## 2020-11-28 ENCOUNTER — Other Ambulatory Visit: Payer: Self-pay

## 2020-11-28 ENCOUNTER — Encounter: Payer: Self-pay | Admitting: Physician Assistant

## 2020-11-28 ENCOUNTER — Ambulatory Visit (INDEPENDENT_AMBULATORY_CARE_PROVIDER_SITE_OTHER): Payer: Medicare Other | Admitting: Physician Assistant

## 2020-11-28 VITALS — BP 110/72 | HR 99 | Ht 64.0 in

## 2020-11-28 DIAGNOSIS — E7849 Other hyperlipidemia: Secondary | ICD-10-CM

## 2020-11-28 DIAGNOSIS — R55 Syncope and collapse: Secondary | ICD-10-CM | POA: Diagnosis not present

## 2020-11-28 DIAGNOSIS — I6521 Occlusion and stenosis of right carotid artery: Secondary | ICD-10-CM

## 2020-11-28 DIAGNOSIS — I609 Nontraumatic subarachnoid hemorrhage, unspecified: Secondary | ICD-10-CM | POA: Diagnosis not present

## 2020-11-28 DIAGNOSIS — Z1231 Encounter for screening mammogram for malignant neoplasm of breast: Secondary | ICD-10-CM | POA: Diagnosis not present

## 2020-11-28 DIAGNOSIS — I10 Essential (primary) hypertension: Secondary | ICD-10-CM | POA: Diagnosis not present

## 2020-11-28 DIAGNOSIS — I4821 Permanent atrial fibrillation: Secondary | ICD-10-CM | POA: Diagnosis not present

## 2020-11-28 NOTE — Patient Instructions (Signed)
Medication Instructions:  Your physician recommends that you continue on your current medications as directed. Please refer to the Current Medication list given to you today.  *If you need a refill on your cardiac medications before your next appointment, please call your pharmacy*   Lab Work: None If you have labs (blood work) drawn today and your tests are completely normal, you will receive your results only by: [place] (if you have MyChart) OR A paper copy in the mail If you have any lab test that is abnormal or we need to change your treatment, we will call you to review the results.   Testing/Procedures: Bryn Gulling- Long Term Monitor Instructions  Your physician has requested you wear a ZIO patch monitor for 14 days.  This is a single patch monitor. [employer] supplies one patch monitor per enrollment. Additional stickers are not available. Please do not apply patch if you will be having a Nuclear Stress Test,  Echocardiogram, Cardiac CT, MRI, or Chest Xray during the period you would be wearing the  monitor. The patch cannot be worn during these tests. You cannot remove and re-apply the  ZIO XT patch monitor.  Your ZIO patch monitor will be mailed 3 day USPS to your address on file. It may take 3-5 days  to receive your monitor after you have been enrolled.  Once you have received your monitor, please review the enclosed instructions. Your monitor  has already been registered assigning a specific monitor serial # to you.  Billing and Patient Assistance Program Information  We have supplied [employer] with any of your insurance information on file for billing purposes. [employer] offers a sliding scale Patient Assistance Program for patients that do not have  insurance, or whose insurance does not completely cover the cost of the ZIO monitor.  You must apply for the Patient Assistance Program to qualify for this discounted rate.  To apply, please call [employer] at [phone], select  option 4, select option 2, ask to apply for  Patient Assistance Program. Theodore Demark will ask your household income, and how many people  are in your household. They will quote your out-of-pocket cost based on that information.  [employer] will also be able to set up a 41-month interest-free payment plan if needed.  Applying the monitor   Shave hair from upper left chest.  Hold abrader disc by orange tab. Rub abrader in 40 strokes over the upper left chest as  indicated in your monitor instructions.  Clean area with 4 enclosed alcohol pads. Let dry.  Apply patch as indicated in monitor instructions. Patch will be placed under collarbone on left  side of chest with arrow pointing upward.  Rub patch adhesive wings for 2 minutes. Remove white label marked "1". Remove the white  label marked "2". Rub patch adhesive wings for 2 additional minutes.  While looking in a mirror, press and release button in center of patch. A small green light will  flash 3-4 times. This will be your only indicator that the monitor has been turned on.  Do not shower for the first 24 hours. You may shower after the first 24 hours.  Press the button if you feel a symptom. You will hear a small click. Record Date, Time and  Symptom in the Patient Logbook.  When you are ready to remove the patch, follow instructions on the last 2 pages of Patient  Logbook. Stick patch monitor onto the last page of Patient Logbook.  Place Patient Logbook in  the blue and white box. Use locking tab on box and tape box closed  securely. The blue and white box has prepaid postage on it. Please place it in the mailbox as  soon as possible. Your physician should have your test results approximately 7 days after the  monitor has been mailed back to [facility].  Call [place] at [phone] if you have questions regarding  your ZIO XT patch monitor. Call them immediately if you see an orange light blinking on your  monitor.   If your monitor falls off in less than 4 days, contact our Monitor department at [phone].  If your monitor becomes loose or falls off after 4 days call [employer] at [phone] for  suggestions on securing your monitor    Follow-Up: At [facility], you and your health needs are our priority.  As part of our continuing mission to provide you with exceptional heart care, we have created designated Provider Care Teams.  These Care Teams include your primary Cardiologist (physician) and Advanced Practice Providers (APPs -  Physician Assistants and Nurse Practitioners) who all work together to provide you with the care you need, when you need it.   Your next appointment:   6-8 week(s)  The format for your next appointment:   In Person  Provider:   Larae Grooms, MD {

## 2020-11-28 NOTE — Progress Notes (Unsigned)
Enrolled patient for a 14 day Zio XT monitor to be mailed to patients home   Dr Varanasi to read 

## 2020-11-29 ENCOUNTER — Telehealth: Payer: Self-pay | Admitting: Primary Care

## 2020-11-29 MED ORDER — BREZTRI AEROSPHERE 160-9-4.8 MCG/ACT IN AERO: 2.0000 | INHALATION_SPRAY | Freq: Two times a day (BID) | RESPIRATORY_TRACT | 4 refills | Status: DC

## 2020-11-29 NOTE — Telephone Encounter (Signed)
Per BW last note 'She has been on several different inhaler in the past including Pulmicort, Anoro and Breztri. She prefers Advair '   Urinary incontinence is not a known side effect of Advair or Breztri.  Has she been checked for a UTI or checked with her urologist if there is any other issues involved - rather than changing her breathing medication constantly ?  Would she want to go back to [place]?  Only alternative medication left to try would beTrelegy        I have called the pt and she stated that her PCP did check her for UTI and she did not show any signs of this.  She stated that she will go back and try the breztri and this has been sent to her pharmacy     med list has been updated.

## 2020-11-29 NOTE — Telephone Encounter (Signed)
Pt states she is on Advair. Would like BW to prescribe something else because she has been having a lot of incontinence episodes and "just can't go on like this." Will come in to see BW if needed, wasn't sure if it could be done over  the phone.Please advise [phone]

## 2020-11-29 NOTE — Telephone Encounter (Signed)
Called and spoke with pt and she stated that she is just not able to keep taking the advair.  She stated that she is urinating all the time.  She stated that this all started to get worse since starting the Advair and has been really bad in the last week.    She is wanting to know if she can change the med or just use her rescue inhaler if needed.  She does have an apt with RA on 01/15/2021.     RA please advise.

## 2020-11-30 DIAGNOSIS — N390 Urinary tract infection, site not specified: Secondary | ICD-10-CM | POA: Diagnosis not present

## 2020-12-02 DIAGNOSIS — R55 Syncope and collapse: Secondary | ICD-10-CM | POA: Diagnosis not present

## 2020-12-03 ENCOUNTER — Other Ambulatory Visit: Payer: Self-pay

## 2020-12-03 MED ORDER — FEXOFENADINE HCL 60 MG PO TABS: 60.0000 mg | ORAL_TABLET | Freq: Every day | ORAL | 3 refills | Status: DC

## 2020-12-05 ENCOUNTER — Telehealth: Payer: Self-pay | Admitting: *Deleted

## 2020-12-05 NOTE — Telephone Encounter (Signed)
Patients 14 day ZIO XT monitor fell off in less than 4 days.  Patient instructed to mail first monitor to [facility] for processing.  I will contact [employer] to cancel charges on the first monitor and mail a replacement.

## 2020-12-06 ENCOUNTER — Emergency Department (HOSPITAL_COMMUNITY): Payer: Medicare Other

## 2020-12-06 ENCOUNTER — Emergency Department (HOSPITAL_COMMUNITY)
Admission: EM | Admit: 2020-12-06 | Discharge: 2020-12-06 | Disposition: A | Discharge status: Home / Self Care | Payer: Medicare Other | Attending: Emergency Medicine | Admitting: Emergency Medicine

## 2020-12-06 DIAGNOSIS — I480 Paroxysmal atrial fibrillation: Secondary | ICD-10-CM | POA: Diagnosis not present

## 2020-12-06 DIAGNOSIS — J449 Chronic obstructive pulmonary disease, unspecified: Secondary | ICD-10-CM | POA: Insufficient documentation

## 2020-12-06 DIAGNOSIS — Z7951 Long term (current) use of inhaled steroids: Secondary | ICD-10-CM | POA: Insufficient documentation

## 2020-12-06 DIAGNOSIS — Z7984 Long term (current) use of oral hypoglycemic drugs: Secondary | ICD-10-CM | POA: Insufficient documentation

## 2020-12-06 DIAGNOSIS — Z79899 Other long term (current) drug therapy: Secondary | ICD-10-CM | POA: Insufficient documentation

## 2020-12-06 DIAGNOSIS — I10 Essential (primary) hypertension: Secondary | ICD-10-CM | POA: Insufficient documentation

## 2020-12-06 DIAGNOSIS — Z7901 Long term (current) use of anticoagulants: Secondary | ICD-10-CM | POA: Diagnosis not present

## 2020-12-06 DIAGNOSIS — R0902 Hypoxemia: Secondary | ICD-10-CM | POA: Diagnosis not present

## 2020-12-06 DIAGNOSIS — D72829 Elevated white blood cell count, unspecified: Secondary | ICD-10-CM | POA: Diagnosis not present

## 2020-12-06 DIAGNOSIS — Z20822 Contact with and (suspected) exposure to covid-19: Secondary | ICD-10-CM | POA: Diagnosis not present

## 2020-12-06 DIAGNOSIS — E119 Type 2 diabetes mellitus without complications: Secondary | ICD-10-CM | POA: Diagnosis not present

## 2020-12-06 DIAGNOSIS — R059 Cough, unspecified: Secondary | ICD-10-CM | POA: Diagnosis not present

## 2020-12-06 DIAGNOSIS — R0789 Other chest pain: Secondary | ICD-10-CM | POA: Diagnosis not present

## 2020-12-06 DIAGNOSIS — Z7982 Long term (current) use of aspirin: Secondary | ICD-10-CM | POA: Diagnosis not present

## 2020-12-06 DIAGNOSIS — I4891 Unspecified atrial fibrillation: Secondary | ICD-10-CM | POA: Diagnosis not present

## 2020-12-06 DIAGNOSIS — Z87891 Personal history of nicotine dependence: Secondary | ICD-10-CM | POA: Diagnosis not present

## 2020-12-06 DIAGNOSIS — R231 Pallor: Secondary | ICD-10-CM | POA: Diagnosis not present

## 2020-12-06 DIAGNOSIS — R0602 Shortness of breath: Secondary | ICD-10-CM | POA: Insufficient documentation

## 2020-12-06 DIAGNOSIS — R079 Chest pain, unspecified: Secondary | ICD-10-CM | POA: Diagnosis not present

## 2020-12-06 LAB — RESP PANEL BY RT-PCR (FLU A&B, COVID) ARPGX2
Influenza A by PCR: NEGATIVE
Influenza B by PCR: NEGATIVE
SARS Coronavirus 2 by RT PCR: NEGATIVE

## 2020-12-06 LAB — CBC WITH DIFFERENTIAL/PLATELET
Abs Immature Granulocytes: 0.16 10*3/uL — ABNORMAL HIGH (ref 0.00–0.07)
Basophils Absolute: 0.1 10*3/uL (ref 0.0–0.1)
Basophils Relative: 0 %
Eosinophils Absolute: 0.1 10*3/uL (ref 0.0–0.5)
Eosinophils Relative: 0 %
HCT: 43.1 % (ref 36.0–46.0)
Hemoglobin: 13.7 g/dL (ref 12.0–15.0)
Immature Granulocytes: 1 %
Lymphocytes Relative: 8 %
Lymphs Abs: 1.2 10*3/uL (ref 0.7–4.0)
MCH: 31.1 pg (ref 26.0–34.0)
MCHC: 31.8 g/dL (ref 30.0–36.0)
MCV: 97.7 fL (ref 80.0–100.0)
Monocytes Absolute: 1.1 10*3/uL — ABNORMAL HIGH (ref 0.1–1.0)
Monocytes Relative: 7 %
Neutro Abs: 12.2 10*3/uL — ABNORMAL HIGH (ref 1.7–7.7)
Neutrophils Relative %: 84 %
Platelets: 313 10*3/uL (ref 150–400)
RBC: 4.41 MIL/uL (ref 3.87–5.11)
RDW: 14.6 % (ref 11.5–15.5)
WBC: 14.8 10*3/uL — ABNORMAL HIGH (ref 4.0–10.5)
nRBC: 0 % (ref 0.0–0.2)

## 2020-12-06 LAB — TROPONIN I (HIGH SENSITIVITY): Troponin I (High Sensitivity): 5 ng/L (ref <18)

## 2020-12-06 LAB — COMPREHENSIVE METABOLIC PANEL
ALT: 17 U/L (ref 0–44)
AST: 23 U/L (ref 15–41)
Albumin: 3.5 g/dL (ref 3.5–5.0)
Alkaline Phosphatase: 44 U/L (ref 38–126)
Anion gap: 8 (ref 5–15)
BUN: 9 mg/dL (ref 8–23)
CO2: 33 mmol/L — ABNORMAL HIGH (ref 22–32)
Calcium: 9.3 mg/dL (ref 8.9–10.3)
Chloride: 97 mmol/L — ABNORMAL LOW (ref 98–111)
Creatinine, Ser: 0.73 mg/dL (ref 0.44–1.00)
GFR, Estimated: >60 mL/min (ref >60)
Glucose, Bld: 104 mg/dL — ABNORMAL HIGH (ref 70–99)
Potassium: 3.7 mmol/L (ref 3.5–5.1)
Sodium: 138 mmol/L (ref 135–145)
Total Bilirubin: 0.7 mg/dL (ref 0.3–1.2)
Total Protein: 7 g/dL (ref 6.5–8.1)

## 2020-12-06 LAB — BRAIN NATRIURETIC PEPTIDE: B Natriuretic Peptide: 41 pg/mL (ref 0.0–100.0)

## 2020-12-06 LAB — LACTIC ACID, PLASMA: Lactic Acid, Venous: 1.9 mmol/L (ref 0.5–1.9)

## 2020-12-06 MED ORDER — AZITHROMYCIN 250 MG PO TABS: 250.0000 mg | ORAL_TABLET | Freq: Every day | ORAL | 0 refills | Status: DC

## 2020-12-06 NOTE — ED Provider Notes (Addendum)
[facility] EMERGENCY DEPARTMENT Provider Note   CSN: [id number] Arrival date & time: 12/06/20  1501     History Chief Complaint  Patient presents with   Shortness of Breath    Beth [name] is a 80 y.o. female.  Patient presents to ER chief complaint of a sensation of palpitations and shortness of breath.  She has an unchanged cough ongoing for several months without any change today.  She denies fevers.  Denies any chest pain but does have palpitations which she attributes to her atrial fibrillation which she has a history of.  She denies any fall or trauma no abdominal pain no headache or neck pain.  Uses oxygen at home but has been feeling it was insufficient for the past day now.      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    Vestibulitis of ear     Patient Active Problem List   Diagnosis Date Noted   Leg weakness 10/30/2020   Vascular dementia with behavior disturbance 09/06/2020   Permanent atrial fibrillation with rapid ventricular response ([place]) 09/06/2020   Breast cancer screening, high risk patient 10/25/2019   Paroxysmal atrial fibrillation ([place]) 07/11/2019   Carotid artery stenosis 06/20/2019   Stroke ([place]) 06/20/2019   Degenerative arthritis of left foot 06/14/2019   Left ankle sprain 06/14/2019   On home O2 05/24/2019   Acute right arterial ischemic stroke, MCA (middle cerebral artery) ([place])  05/24/2019   Stenosis of right carotid artery    Atrial fibrillation with RVR ([place]) 05/18/2019   Seizures ([place])    HLD (hyperlipidemia)    Macrocytosis    Acute ischemic right MCA stroke ([place])    Depression    Allergic rhinitis 05/02/2019   At high risk for breast cancer 12/09/2017   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   Obesity, morbid, BMI 40.0-49.9 ([place]) 04/30/2016   Chronic respiratory failure ([place]) 12/11/2015   COPD (chronic obstructive pulmonary disease) ([place]) 10/08/2015   Genetic testing 08/26/2015   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   Family history of ovarian cancer 07/26/2015   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   Benign paroxysmal positional vertigo 04/06/2014   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   Pulsatile tinnitus of right ear 04/06/2014   Other forms of epilepsy and recurrent seizures without mention of intractable epilepsy 04/21/2013   SAH (subarachnoid hemorrhage) ([place]) 04/21/2013   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   Seizure disorder, complex partial, 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[place])    Stroke, hemorrhagic ([place])    HTN (hypertension)    Vestibulitis of ear     Past Surgical History:  Procedure Laterality Date   broken ankle Right    TONSILLECTOMY     TRANSCAROTID ARTERY REVASCULARIZATION  Right 06/20/2019   TRANSCAROTID ARTERY REVASCULARIZATION  Right 06/20/2019   Procedure: RIGHT  TRANSCAROTID ARTERY REVASCULARIZATION;  Surgeon: Angelia Mould, MD;  Location: [facility] OR;  Service: Vascular;  Laterality: Right;     OB History     Gravida  2   Para  2   Term  2   Preterm      AB      Living  2      SAB      IAB      Ectopic      Multiple      Live Births  2           Family History  Problem Relation Age of Onset   Dementia Mother    Stroke Mother    Thyroid disease Mother    Osteoporosis Mother    Ovarian cancer Paternal Grandmother 75   Ovarian cancer Paternal Aunt 61   Breast  cancer Daughter 57       breast ca x2, bilateral dx. 49, 46--estrogen   Other Daughter        no genetic testing as of 07/2015; has had ovaries removed   Other Daughter        BRCA 2 Pos.   Colon cancer Maternal Aunt        dx. unspecified age   Emphysema Maternal Uncle    High blood pressure Maternal Grandmother    Dementia Maternal Grandfather    Bleeding Disorder Maternal Grandfather    Emphysema Maternal Aunt    Emphysema Maternal Aunt     Social History   Tobacco Use   Smoking status: Former    Packs/day: 1.00    Years: 41.00    Pack years: 41.00    Types: Cigarettes    Quit date: 01/13/2006    Years since quitting: 14.9   Smokeless tobacco: Never  Vaping Use   Vaping Use: Never used  Substance Use Topics   Alcohol use: No    Alcohol/week: 0.0 standard drinks   Drug use: No    Home Medications Prior to Admission medications   Medication Sig Start Date End Date Taking? Authorizing Provider  azithromycin (ZITHROMAX) 250 MG tablet Take 1 tablet (250 mg total) by mouth daily. Take first 2 tablets together, then 1 every day until finished. 12/06/20  Yes Luna Fuse, MD  acetaminophen (TYLENOL) 325 MG tablet Take 1-2 tablets (325-650 mg total) by mouth every 4 (four) hours as needed for mild pain. 06/03/19   Love, Ivan Anchors, PA-C  albuterol (PROAIR HFA) 108 (90 Base) MCG/ACT inhaler Inhale 2 puffs into the lungs every 6 (six) hours as needed for wheezing or shortness of breath. 01/18/20   Rigoberto Noel, MD  aspirin EC 81 MG tablet Take 81 mg by mouth daily.    [provider]  atorvastatin (LIPITOR) 20 MG tablet TAKE 1 TABLET DAILY 04/26/20   Angelia Mould, MD  Budeson-Glycopyrrol-Formoterol (BREZTRI AEROSPHERE) 160-9-4.8 MCG/ACT AERO Inhale 2 puffs into the lungs in the morning and at bedtime. 11/29/20   Rigoberto Noel, MD  captopril (CAPOTEN) 50 MG tablet Take 50 mg by mouth 2 (two) times daily.    [provider]  Cholecalciferol (VITAMIN D3) 2000  units capsule Take 2,000 Units by mouth daily.     [provider]  clonazePAM (KLONOPIN) 2 MG tablet Take 1 tablet at bedtime (seizure and insomnia) may take additional 0.5 tablet if necessary for panic attack. Our goal is to try and slowly wean down to no more than 1 tab a day. 09/12/20  Dohmeier, Asencion Partridge, MD  diltiazem (CARDIZEM CD) 360 MG 24 hr capsule Take 1 capsule (360 mg total) by mouth daily. 08/02/20   Jettie Booze, MD  ELIQUIS 5 MG TABS tablet TAKE 1 TABLET TWICE A DAY 08/22/20   Jettie Booze, MD  famotidine (PEPCID) 20 MG tablet Take 1 tablet (20 mg total) by mouth 2 (two) times daily. 06/03/19   Love, Ivan Anchors, PA-C  fexofenadine (ALLEGRA ALLERGY) 60 MG tablet Take 1 tablet (60 mg total) by mouth daily. 12/03/20   Martyn Ehrich, NP  fluticasone (FLONASE) 50 MCG/ACT nasal spray Place 1 spray into the nose daily as needed for allergies. Spray twice daily as needed    [provider]  gabapentin (NEURONTIN) 600 MG tablet Take 0.5 tablets (300 mg total) by mouth at bedtime. 09/06/20   Dohmeier, Asencion Partridge, MD  hydrocerin (EUCERIN) CREA Apply 1 application topically 2 (two) times daily. 06/03/19   Love, Ivan Anchors, PA-C  ipratropium-albuterol (DUONEB) 0.5-2.5 (3) MG/3ML SOLN 3 ml as needed    [provider]  levETIRAcetam (KEPPRA) 750 MG tablet Take 1 tablet (750 mg total) by mouth 2 (two) times daily. 09/06/20   Dohmeier, Asencion Partridge, MD  LEXAPRO 20 MG tablet Take 1 tablet (20 mg total) by mouth daily. Morning dosage 04/21/13   Dohmeier, Asencion Partridge, MD  memantine (NAMENDA) 5 MG tablet Take 1 tablet (5 mg total) by mouth 2 (two) times daily. 09/25/20   Dohmeier, Asencion Partridge, MD  Menthol-Methyl Salicylate (MUSCLE RUB) 10-15 % CREA Apply 1 application topically as needed for muscle pain. To neck and shoulder 06/03/19   Love, Ivan Anchors, PA-C  metFORMIN (GLUCOPHAGE) 500 MG tablet Take 500 mg by mouth daily. 10/26/19   [provider]  metoprolol tartrate (LOPRESSOR) 25 MG  tablet Take 25 mg by mouth daily. 05/02/20   [provider]  polyethylene glycol (MIRALAX / GLYCOLAX) 17 g packet Take 17 g by mouth daily. Purchase bottle over the counter and use capful daily 06/04/19   Love, Ivan Anchors, PA-C  predniSONE (DELTASONE) 10 MG tablet Take 2 tabs x 5 days 10/30/20   Martyn Ehrich, NP  sodium chloride (OCEAN) 0.65 % SOLN nasal spray Place 1 spray into both nostrils in the morning, at noon, in the evening, and at bedtime. 06/03/19   Love, Ivan Anchors, PA-C  Spacer/Aero-Holding Chambers (AEROCHAMBER PLUS WITH MASK) inhaler As needed with inhaler 12/27/19   Rigoberto Noel, MD  vitamin B-12 (CYANOCOBALAMIN) 500 MCG tablet Take 1 tablet (500 mcg total) by mouth daily. 06/04/19   Love, Ivan Anchors, PA-C    Allergies    Prozac [fluoxetine hcl], Sulfa antibiotics, Micardis [telmisartan], Prednisone, Lisinopril-hydrochlorothiazide, Augmentin [amoxicillin-pot clavulanate], Avelox [moxifloxacin hcl in nacl], and Norvasc [amlodipine besylate]  Review of Systems   Review of Systems  Constitutional:  Negative for fever.  HENT:  Negative for ear pain.   Eyes:  Negative for pain.  Respiratory:  Positive for cough and shortness of breath.   Cardiovascular:  Positive for palpitations. Negative for chest pain.  Gastrointestinal:  Negative for abdominal pain.  Genitourinary:  Negative for flank pain.  Musculoskeletal:  Negative for back pain.  Skin:  Negative for rash.  Neurological:  Negative for headaches.   Physical Exam Updated Vital Signs BP 137/78   Pulse 61   Temp 97.6 F (36.4 C) (Oral)   Resp 20   LMP 01/14/1995 (Approximate)   SpO2 98%   Physical Exam Constitutional:      General: She  is not in acute distress.    Appearance: Normal appearance.  HENT:     Head: Normocephalic.     Nose: Nose normal.  Eyes:     Extraocular Movements: Extraocular movements intact.  Cardiovascular:     Rate and Rhythm: Normal rate.  Pulmonary:     Effort: Pulmonary  effort is normal.     Breath sounds: No wheezing.  Musculoskeletal:        General: Normal range of motion.     Cervical back: Normal range of motion.  Neurological:     General: No focal deficit present.     Mental Status: She is alert. Mental status is at baseline.    ED Results / Procedures / Treatments   Labs (all labs ordered are listed, but only abnormal results are displayed) Labs Reviewed  CBC WITH DIFFERENTIAL/PLATELET - Abnormal; Notable for the following components:      Result Value   WBC 14.8 (*)    Neutro Abs 12.2 (*)    Monocytes Absolute 1.1 (*)    Abs Immature Granulocytes 0.16 (*)    All other components within normal limits  COMPREHENSIVE METABOLIC PANEL - Abnormal; Notable for the following components:   Chloride 97 (*)    CO2 33 (*)    Glucose, Bld 104 (*)    All other components within normal limits  CULTURE, BLOOD (ROUTINE X 2)  CULTURE, BLOOD (ROUTINE X 2)  RESP PANEL BY RT-PCR (FLU A&B, COVID) ARPGX2  BRAIN NATRIURETIC PEPTIDE  LACTIC ACID, PLASMA  TROPONIN I (HIGH SENSITIVITY)  TROPONIN I (HIGH SENSITIVITY)    EKG EKG Interpretation  Date/Time:  Thursday December 06 2020 15:08:22 EST Ventricular Rate:  77 PR Interval:    QRS Duration: 99 QT Interval:  391 QTC Calculation: 437 R Axis:   -75 Text Interpretation: Atrial fibrillation LAD, consider LAFB or inferior infarct Anteroseptal infarct, age indeterminate Baseline wander in lead(s) V3 V5 V6 Confirmed by Thamas Jaegers ([id number]) on 12/06/2020 3:27:08 PM  Radiology DG Chest Port 1 View  Result Date: 12/06/2020 CLINICAL DATA:  Shortness of breath EXAM: PORTABLE CHEST 1 VIEW COMPARISON:  07/04/2020 FINDINGS: UPPER limits normal heart size again noted. There is no evidence of focal airspace disease, pulmonary edema, suspicious pulmonary nodule/mass, pleural effusion, or pneumothorax. Calcified granulomas within the lungs again noted. No acute bony abnormalities are identified. IMPRESSION: No active  disease. Electronically Signed   By: Margarette Canada M.D.   On: 12/06/2020 16:01    Procedures Procedures   Medications Ordered in ED Medications - No data to display  ED Course  I have reviewed the triage vital signs and the nursing notes.  Pertinent labs & imaging results that were available during my care of the patient were reviewed by me and considered in my medical decision making (see chart for details).    MDM Rules/Calculators/A&P                           Breath sounds were clear bilaterally I did not hear any wheezes.  She was titrated back down to her baseline which she states is around 2 to 2.5 L O2 supplementation.  O2 saturation is 95 to 90% on this amount of oxygen support which is appropriate for her.  Work-up thus far has been unremarkable white count mildly elevated, however chest x-ray is clear.  Troponin is negative proBNP is normal as well.  Chest x-ray shows no evidence of pneumonia per  radiologist.  6:19 PM Patient states she does not want to stay in the ER anymore she is feeling much better.  She declines to stay for any additional testing.  Does not wish to wait for her viral panel. Given her comorbidities and symptoms will treat with a prescription of antibiotics.  Advised outpatient follow-up with her primary care doctor within the week.  Advising immediate return for worsening symptoms pain or any additional concerns.  Final Clinical Impression(s) / ED Diagnoses Final diagnoses:  Shortness of breath    Rx / DC Orders ED Discharge Orders          Ordered    azithromycin (ZITHROMAX) 250 MG tablet  Daily        12/06/20 1819             Luna Fuse, MD 12/06/20 1819    Luna Fuse, MD 12/06/20 1819

## 2020-12-06 NOTE — Discharge Instructions (Addendum)
Call your primary care doctor or specialist as discussed in the next 2-3 days.   Return immediately back to the ER if:  Your symptoms worsen within the next 12-24 hours. You develop new symptoms such as new fevers, persistent vomiting, new pain, shortness of breath, or new weakness or numbness, or if you have any other concerns.  

## 2020-12-06 NOTE — ED Triage Notes (Signed)
Pt [facility] From home c/o SOB and CP. CP started last night, substernal ,non-radiating. Bladder infection 10 days ago. Baseline 2.5 L O2 at home. Hx copd and Afib. A&O X4.   156/80 HR 70 CBG 121  98% 4L

## 2020-12-11 LAB — CULTURE, BLOOD (ROUTINE X 2)
Culture: NO GROWTH
Culture: NO GROWTH

## 2020-12-14 ENCOUNTER — Other Ambulatory Visit: Payer: Self-pay

## 2020-12-14 DIAGNOSIS — Z79899 Other long term (current) drug therapy: Secondary | ICD-10-CM | POA: Diagnosis not present

## 2020-12-14 DIAGNOSIS — R0602 Shortness of breath: Secondary | ICD-10-CM | POA: Diagnosis not present

## 2020-12-14 DIAGNOSIS — K5901 Slow transit constipation: Secondary | ICD-10-CM | POA: Diagnosis not present

## 2020-12-14 DIAGNOSIS — E1169 Type 2 diabetes mellitus with other specified complication: Secondary | ICD-10-CM | POA: Diagnosis not present

## 2020-12-14 DIAGNOSIS — Z9189 Other specified personal risk factors, not elsewhere classified: Secondary | ICD-10-CM

## 2020-12-14 DIAGNOSIS — R55 Syncope and collapse: Secondary | ICD-10-CM | POA: Diagnosis not present

## 2020-12-14 DIAGNOSIS — J9611 Chronic respiratory failure with hypoxia: Secondary | ICD-10-CM | POA: Diagnosis not present

## 2020-12-14 DIAGNOSIS — K219 Gastro-esophageal reflux disease without esophagitis: Secondary | ICD-10-CM | POA: Diagnosis not present

## 2020-12-14 DIAGNOSIS — L304 Erythema intertrigo: Secondary | ICD-10-CM | POA: Diagnosis not present

## 2020-12-14 DIAGNOSIS — J449 Chronic obstructive pulmonary disease, unspecified: Secondary | ICD-10-CM | POA: Diagnosis not present

## 2020-12-16 NOTE — Progress Notes (Incomplete)
[place]  [phone] [phone]     ID: Beth [name] DOB: 03-25-1940  MR#: [id number]  TIW#:[id number]  Patient Care Team: Beth Lass, [name] as PCP - General (Family Medicine) Beth Booze, [name] as PCP - Cardiology (Cardiology) Magrinat, Virgie Dad, [name] as Consulting Physician (Oncology) Beth Hector, [name] as Consulting Physician (Cardiology) Beth Noel, [name] as Consulting Physician (Pulmonary Disease) Dohmeier, Asencion Partridge, [name] as Consulting Physician (Neurology) Martinique, Amy, [name] as Consulting Physician (Dermatology) Beth Saas, [name] as Consulting Physician (Orthopedic Surgery) Beth Horner, [name] as Consulting Physician (Gastroenterology) Beth [name]:  CHIEF COMPLAINT: 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 She continues under observation. She is accompanied by her husband  Since her last visit, she underwent bilateral screening mammography with tomography at [facility] on 06/06/2020 showing: breast density category B; no evidence of malignancy in either breast.    REVIEW OF SYSTEMS: Beth [name]    COVID Beth [name]: [place] x3, last 11/2019   HISTORY  PRESENT ILLNESS: From the original intake note:  Beth [name] has a significant family history of breast and ovarian cancer and in fact her daughter Beth [name] was my patient with breast cancer many years ago. Beth [name] subsequent had a second cancer which she also has survived. On the father's side the patient has little information except that her paternal grandmother had ovarian cancer and one of the paternal aunts also had ovarian cancer, both of them dying in their 70s.  Because of this the patient opted to rbe tested for BRCA mutations and proved to carry a deleterious mutation in the BRCA2 Beth [name], namely [id number]).  She  was referred for consideration of risk reducon and intensified screening strategies.   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    Vestibulitis of ear     PAST SURGICAL HISTORY: Past Surgical History:  Procedure Laterality Date   broken ankle Right    TONSILLECTOMY     TRANSCAROTID ARTERY REVASCULARIZATION  Right 06/20/2019   TRANSCAROTID ARTERY REVASCULARIZATION  Right 06/20/2019   Procedure: RIGHT TRANSCAROTID ARTERY REVASCULARIZATION;  Surgeon: Angelia Mould, [name];  Location: [facility] OR;  Service: Vascular;  Laterality: Right;    FAMILY HISTORY Family History  Problem Relation Age of Onset   Dementia Mother    Stroke Mother    Thyroid disease Mother    Osteoporosis Mother    Ovarian cancer Paternal Grandmother 34   Ovarian cancer Paternal Aunt 84   Breast cancer Daughter 62       breast ca x2, bilateral dx. 44, 46--estrogen   Beth Daughter        no genetic testing as of 07/2015; has had ovaries  removed   Beth Daughter        BRCA 2 Pos.   Colon cancer Maternal Aunt        dx. unspecified age   Emphysema Maternal Uncle    High blood pressure Maternal Grandmother    Dementia Maternal Grandfather    Bleeding Disorder Maternal Grandfather    Emphysema Maternal Aunt    Emphysema Maternal Aunt     GYNECOLOGIC HISTORY:  Patient's last menstrual period was 01/14/1995 (approximate). Menarche age 39, first  live birth age 29. The patient is GX P2. 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 Her husband Beth [name] is a retired Charity fundraiser. He also worked for the [employer]. At home is just the 2 of them plus their dog. The patient's daughter, Beth [name], is BRCA positive, and has undergone a bilateral Pingel oophorectomy and lumpectomy. The patient's younger daughter Beth [name] is BRCA2 positive and she has undergone a total abdominal hysterectomy with bilateral salpingo-oophorectomy and also bilateral mastectomies.    ADVANCED DIRECTIVES: The patient's husband is her healthcare power of attorney   HEALTH MAINTENANCE: Social History   Tobacco Use   Smoking status: Former    Packs/day: 1.00    Years: 41.00    Pack years: 41.00    Types: Cigarettes    Quit date: 01/13/2006    Years since quitting: 14.9   Smokeless tobacco: Never  Vaping Use   Vaping Use: Never used  Substance Use Topics   Alcohol use: No    Alcohol/week: 0.0 standard drinks   Drug use: No     Colonoscopy: Beth [name]  PAP:  Bone density: "normal"   Allergies  Allergen Reactions   Prozac [Fluoxetine Hcl] Beth (See Comments)    seizures   Sulfa Antibiotics Nausea And Vomiting and Beth (See Comments)    TINGLING IN LEGS ALSO   Micardis [Telmisartan] Swelling and Beth (See Comments)    Swelling, bruising Swelling, bruising   Prednisone Beth (See Comments)    mood changes Mood changes   Lisinopril-Hydrochlorothiazide Beth (See Comments)    unknown   Augmentin [Amoxicillin-Pot Clavulanate] Rash   Avelox [Moxifloxacin Hcl In Nacl] Rash    LEVAQUIN IS OK-SEE NOTE   Norvasc [Amlodipine Besylate] Beth (See Comments)    fatigue    Current Outpatient Medications  Medication Sig Dispense Refill   acetaminophen (TYLENOL) 325 MG tablet Take 1-2 tablets (325-650 mg total) by mouth every 4 (four) hours as  needed for mild pain.     albuterol (PROAIR HFA) 108 (90 Base) MCG/ACT inhaler Inhale 2 puffs into the lungs every 6 (six) hours as needed for wheezing or shortness of breath. 24 g 1   aspirin EC 81 MG tablet Take 81 mg by mouth daily.     atorvastatin (LIPITOR) 20 MG tablet TAKE 1 TABLET DAILY 90 tablet 3   azithromycin (ZITHROMAX) 250 MG tablet Take 1 tablet (250 mg total) by mouth daily. Take first 2 tablets together, then 1 every day until finished. 6 tablet 0   Budeson-Glycopyrrol-Formoterol (BREZTRI AEROSPHERE) 160-9-4.8 MCG/ACT AERO Inhale 2 puffs into the lungs in the morning and at bedtime. 10.7 g 4   captopril (CAPOTEN) 50 MG tablet Take 50 mg by mouth 2 (two) times daily.     Cholecalciferol (VITAMIN D3) 2000 units capsule Take 2,000 Units by mouth daily.      clonazePAM (KLONOPIN) 2  MG tablet Take 1 tablet at bedtime (seizure and insomnia) may take additional 0.5 tablet if necessary for panic attack. Our goal is to try and slowly wean down to no more than 1 tab a day. 135 tablet 0   diltiazem (CARDIZEM CD) 360 MG 24 hr capsule Take 1 capsule (360 mg total) by mouth daily. 90 capsule 3   ELIQUIS 5 MG TABS tablet TAKE 1 TABLET TWICE A DAY 180 tablet 3   famotidine (PEPCID) 20 MG tablet Take 1 tablet (20 mg total) by mouth 2 (two) times daily. 60 tablet 0   fexofenadine (ALLEGRA ALLERGY) 60 MG tablet Take 1 tablet (60 mg total) by mouth daily. 30 tablet 3   fluticasone (FLONASE) 50 MCG/ACT nasal spray Place 1 spray into the nose daily as needed for allergies. Spray twice daily as needed     gabapentin (NEURONTIN) 600 MG tablet Take 0.5 tablets (300 mg total) by mouth at bedtime. 45 tablet 3   hydrocerin (EUCERIN) CREA Apply 1 application topically 2 (two) times daily.  0   ipratropium-albuterol (DUONEB) 0.5-2.5 (3) MG/3ML SOLN 3 ml as needed     levETIRAcetam (KEPPRA) 750 MG tablet Take 1 tablet (750 mg total) by mouth 2 (two) times daily. 180 tablet 3   LEXAPRO 20 MG tablet Take 1 tablet  (20 mg total) by mouth daily. Morning dosage 90 tablet 3   memantine (NAMENDA) 5 MG tablet Take 1 tablet (5 mg total) by mouth 2 (two) times daily. 60 tablet 6   Menthol-Methyl Salicylate (MUSCLE RUB) 10-15 % CREA Apply 1 application topically as needed for muscle pain. To neck and shoulder  0   metFORMIN (GLUCOPHAGE) 500 MG tablet Take 500 mg by mouth daily.     metoprolol tartrate (LOPRESSOR) 25 MG tablet Take 25 mg by mouth daily.     polyethylene glycol (MIRALAX / GLYCOLAX) 17 g packet Take 17 g by mouth daily. Purchase bottle over the counter and use capful daily 14 each 0   predniSONE (DELTASONE) 10 MG tablet Take 2 tabs x 5 days 10 tablet 0   sodium chloride (OCEAN) 0.65 % SOLN nasal spray Place 1 spray into both nostrils in the morning, at noon, in the evening, and at bedtime.  0   Spacer/Aero-Holding Chambers (AEROCHAMBER PLUS WITH MASK) inhaler As needed with inhaler 1 each 0   vitamin B-12 (CYANOCOBALAMIN) 500 MCG tablet Take 1 tablet (500 mcg total) by mouth daily.     No current facility-administered medications for this visit.    OBJECTIVE: White woman using oxygen by nasal cannula There were no vitals filed for this visit.    There is no height or weight on file to calculate BMI.    ECOG FS:2 - Symptomatic, <50% confined to bed  Sclerae unicteric, EOMs intact Wearing a mask No cervical or supraclavicular adenopathy Lungs no rales or rhonchi Heart regular rate and rhythm Abd soft, nontender, positive bowel sounds MSK no focal spinal tenderness, no upper extremity lymphedema Neuro: nonfocal, well oriented, appropriate affect Breasts:    {Sclerae unicteric, EOMs intact Wearing a mask No cervical or supraclavicular adenopathy Lungs no rales or rhonchi Heart irregular rate , rapid rhythm Abd soft, nontender, positive bowel sounds MSK mild kyphosis but no focal spinal tenderness Neuro: nonfocal, well oriented, appropriate affect Breasts: No palpable masses in either  breast, no skin or nipple changes of concern.  Both axillae are benign.}   LAB RESULTS:  CMP     Component Value Date/Time  NA 138 12/06/2020 1510   NA 146 (H) 08/02/2020 1221   K 3.7 12/06/2020 1510   CL 97 (L) 12/06/2020 1510   CO2 33 (H) 12/06/2020 1510   GLUCOSE 104 (H) 12/06/2020 1510   BUN 9 12/06/2020 1510   BUN 13 08/02/2020 1221   CREATININE 0.73 12/06/2020 1510   CREATININE 0.85 02/22/2018 1051   CALCIUM 9.3 12/06/2020 1510   PROT 7.0 12/06/2020 1510   ALBUMIN 3.5 12/06/2020 1510   AST 23 12/06/2020 1510   AST 18 02/22/2018 1051   ALT 17 12/06/2020 1510   ALT 16 02/22/2018 1051   ALKPHOS 44 12/06/2020 1510   BILITOT 0.7 12/06/2020 1510   BILITOT 0.6 02/22/2018 1051   GFRNONAA >60 12/06/2020 1510   GFRNONAA >60 02/22/2018 1051   GFRAA >60 06/20/2019 0657   GFRAA >60 02/22/2018 1051    No results found for: TOTALPROTELP, ALBUMINELP, A1GS, A2GS, BETS, BETA2SER, GAMS, MSPIKE, SPEI  No results found for: Nils Pyle, [facility]  Lab Results  Component Value Date   WBC 14.8 (H) 12/06/2020   NEUTROABS 12.2 (H) 12/06/2020   HGB 13.7 12/06/2020   HCT 43.1 12/06/2020   MCV 97.7 12/06/2020   PLT 313 12/06/2020      Chemistry      Component Value Date/Time   NA 138 12/06/2020 1510   NA 146 (H) 08/02/2020 1221   K 3.7 12/06/2020 1510   CL 97 (L) 12/06/2020 1510   CO2 33 (H) 12/06/2020 1510   BUN 9 12/06/2020 1510   BUN 13 08/02/2020 1221   CREATININE 0.73 12/06/2020 1510   CREATININE 0.85 02/22/2018 1051      Component Value Date/Time   CALCIUM 9.3 12/06/2020 1510   ALKPHOS 44 12/06/2020 1510   AST 23 12/06/2020 1510   AST 18 02/22/2018 1051   ALT 17 12/06/2020 1510   ALT 16 02/22/2018 1051   BILITOT 0.7 12/06/2020 1510   BILITOT 0.6 02/22/2018 1051       No results found for: LABCA2  No components found for: [id number]  No results for input(s): INR in the last 168 hours.  Urinalysis    Component Value Date/Time    COLORURINE STRAW (A) 05/27/2019 0837   APPEARANCEUR CLEAR 05/27/2019 0837   LABSPEC 1.006 05/27/2019 0837   PHURINE 7.0 05/27/2019 0837   GLUCOSEU NEGATIVE 05/27/2019 0837   HGBUR SMALL (A) 05/27/2019 0837   BILIRUBINUR NEGATIVE 05/27/2019 0837   KETONESUR NEGATIVE 05/27/2019 0837   PROTEINUR NEGATIVE 05/27/2019 0837   UROBILINOGEN 0.2 08/18/2014 1742   NITRITE NEGATIVE 05/27/2019 0837   LEUKOCYTESUR SMALL (A) 05/27/2019 0837    STUDIES: DG Chest Port 1 View  Result Date: 12/06/2020 CLINICAL DATA:  Shortness of breath EXAM: PORTABLE CHEST 1 VIEW COMPARISON:  07/04/2020 FINDINGS: UPPER limits normal heart size again noted. There is no evidence of focal airspace disease, pulmonary edema, suspicious pulmonary nodule/mass, pleural effusion, or pneumothorax. Calcified granulomas within the lungs again noted. No acute bony abnormalities are identified. IMPRESSION: No active disease. Electronically Signed   By: Margarette Canada M.D.   On: 12/06/2020 16:01   LONG TERM MONITOR (3-14 DAYS)  Result Date: 12/16/2020  Atrial fibrillation with overall, adequate rate control. Average HR 88 bpm.  Rare PVCs that were associated with patient symptoms at times.  No significant pauses.  Patch Wear Time:  3 days and 0 hours (2022-11-20T16:00:30-0500 to 2022-11-23T16:00:52-0500) Atrial Fibrillation occurred continuously (100% burden), ranging from 59-135 bpm (avg of 88 bpm). Isolated VEs were rare (<  1.0%), and no VE Couplets or VE Triplets were present.      ELIGIBLE FOR AVAILABLE RESEARCH PROTOCOL: No  ASSESSMENT: 80 y.o. BRCA2 positive [place] woman at increased risk for breast and ovarian cancer  (a) the specific BRCA2 mutation is [id number]).   (1) consider anastrozole for breast cancer risk reduction  (a) DEXA scan 01/16/2014 normal, with a T score of 0.7  (2) yearly MRI in addition to yearly mammography, 6 months apart, for intensified breast cancer screening  (3) unable to  undergo bilateral salpingo-oophorectomy--followed by gynecological oncology with biannual pelvic ultrasound and Ca-125 determinations   PLAN: Adonica has recovered well from her stroke.  She continues to have severe pulmonary problems which is the reason of course she is not able to undergo bilateral salpingo-oophorectomy.  At this point she prefers to forego screening for ovarian cancer.  We will try to alert the [facility] group regarding that.  We are also going to omit the MRI.  She had been scheduled for this but missed the appointment.  I think it would be difficult for her to lie flat for 20 minutes and the oxygen also would complicate things.  Fortunately her breast density is category 8.  This means her mammogram is very sensitive.  She will have mammography in April and she will see me next May  She knows to call for any issue that may develop before the next visit  Total encounter time 25 minutes.*   Magrinat, Virgie Dad, [name]  12/16/20 8:13 PM Medical Oncology and Hematology [facility] [place], [place] [address] Tel. [phone]    Fax. [phone]   I, Wilburn Mylar, am acting as scribe for Dr. Virgie [name]. Magrinat.  I, Lurline Del [name], have reviewed the above documentation for accuracy and completeness, and I agree with the above.   *Total Encounter Time as defined by the Centers for Medicare and Medicaid Services includes, in addition to the face-to-face time of a patient visit (documented in the note above) non-face-to-face time: obtaining and reviewing outside history, ordering and reviewing medications, tests or procedures, care coordination (communications with Beth health care professionals or caregivers) and documentation in the medical record.

## 2020-12-17 ENCOUNTER — Telehealth: Payer: Self-pay | Admitting: Pulmonary Disease

## 2020-12-17 ENCOUNTER — Inpatient Hospital Stay: Payer: Medicare Other

## 2020-12-17 ENCOUNTER — Inpatient Hospital Stay: Payer: Medicare Other | Admitting: Oncology

## 2020-12-17 NOTE — Telephone Encounter (Signed)
I do not see a DPR where we have permission to speak with [place].  Called pt and spoke with spouse about the call received. Pt has had increased SOB for about a month and due to this, pt's O2 was bumped up to 3L due to her symptoms. Pt said that her sats have been in the low 90s.  Asked pt if she has had any coughing and she said that she has had a mild cough but said she has had congestion in chest.  Pt said that she does use the [place] every day as prescribed.  Pt said that she went to the ED Thanksgiving day as she was having pressure in chest. Pt went the following week to see PCP for a follow up after her visit at the ED. Pt said that she will frequently have palpations and stated that she has spoken with her cardiologist and was told by them that they are not going to change anything with the current plan that they are doing.  Pt was last seen by BW 10/30/20 due to having an exacerbation of COPD. Dr. Elsworth Soho, please advise if you have any recommendations for pt due to her symptoms of cough, congestion, and increased SOB.

## 2020-12-17 NOTE — Telephone Encounter (Signed)
Appt scheduled for 12/18/2020 at 3:30. Patient is aware and voiced her understanding.  Nothing further needed.

## 2020-12-18 ENCOUNTER — Ambulatory Visit (INDEPENDENT_AMBULATORY_CARE_PROVIDER_SITE_OTHER): Payer: Medicare Other | Admitting: Primary Care

## 2020-12-18 ENCOUNTER — Other Ambulatory Visit: Payer: Self-pay

## 2020-12-18 ENCOUNTER — Encounter: Payer: Self-pay | Admitting: Primary Care

## 2020-12-18 ENCOUNTER — Ambulatory Visit: Payer: Medicare Other | Admitting: Primary Care

## 2020-12-18 VITALS — BP 114/62 | HR 74 | Temp 97.7°F | Ht 64.0 in | Wt 232.0 lb

## 2020-12-18 DIAGNOSIS — D72829 Elevated white blood cell count, unspecified: Secondary | ICD-10-CM | POA: Diagnosis not present

## 2020-12-18 DIAGNOSIS — R0602 Shortness of breath: Secondary | ICD-10-CM

## 2020-12-18 DIAGNOSIS — J9611 Chronic respiratory failure with hypoxia: Secondary | ICD-10-CM | POA: Diagnosis not present

## 2020-12-18 DIAGNOSIS — R300 Dysuria: Secondary | ICD-10-CM | POA: Diagnosis not present

## 2020-12-18 DIAGNOSIS — J42 Unspecified chronic bronchitis: Secondary | ICD-10-CM | POA: Diagnosis not present

## 2020-12-18 DIAGNOSIS — J441 Chronic obstructive pulmonary disease with (acute) exacerbation: Secondary | ICD-10-CM | POA: Diagnosis not present

## 2020-12-18 MED ORDER — ADVAIR HFA 230-21 MCG/ACT IN AERO: 2.0000 | INHALATION_SPRAY | Freq: Two times a day (BID) | RESPIRATORY_TRACT | 11 refills | Status: DC

## 2020-12-18 NOTE — Assessment & Plan Note (Addendum)
-   WBC remains consistently elevated over the last several months. BC were negative on 11/24 - Check cbc with diff, urine culture and CT chest wo contrast - If unable to identify source of leukocytosis we will refer to hematology

## 2020-12-18 NOTE — Patient Instructions (Addendum)
Recommendations: Stop Breztri Resume Advair 230 two puffs morning and evening  Use AYR saline nasal gel twice a day We may want to refer to hematology if we can not find source of elevated WBCs   Orders:  Labs and Urine culture (ordered)  CT chest wo contrast re: shortness of breath (ordered)  Follow-up: January 3rd with Dr. Elsworth Soho

## 2020-12-18 NOTE — Assessment & Plan Note (Signed)
-   Stable; O2 100% on 3L

## 2020-12-18 NOTE — Assessment & Plan Note (Addendum)
-   Checking urine culture

## 2020-12-18 NOTE — Assessment & Plan Note (Addendum)
-   Patient reports increased dyspnea symptoms over the last 4-6 weeks. Associated upper airway congestion along with other vague symptoms of diaphoresis, weakness and feeling shaky.  - She has been on several diffent inhalers in the past including pulmicort, Anoro and recently [employer]. She continues to prefer Advair to any other inhaler as of today's visit. We will have her resume Advair HFA 230-26mcg two puffs BID. Advised against changing maintenance inhaler so frequently.  - We will check CT chest wo contrast d.t shortness of breath and cough. She had some rhonchi to right lung on exam along with persistent leukocytosis. CXR on 12/06/20 showed no acute disease, calcified granulomas within the lung.  - Since she just recently finished course of Levaquin for UTI we will could off on further abx until testing comes back  - She has scheduled follow-up in January with Dr. Elsworth Soho

## 2020-12-18 NOTE — Progress Notes (Signed)
_0  ID: Beth [name], female    DOB: August 18, 1940, 80 y.o.   MRN: [id number]  Chief Complaint  Patient presents with   Follow-up    Follow up.     Referring provider: Kathyrn Lass, MD  HPI: 80 year old female, former smoker. PMH significant for COPD, chronic respiratory failure O2 dependent, permanent afib, HTN, organic brain syndrome, stroke, BRCA2 positive, obesity. Patient of Dr. Elsworth Soho. Prefers Advair over [employer].  Previous [facility] pulmonary encounters: 04/03/2020  Patient presents today for 3 month follow-up. During last visit in December 2021 she was given trial of Breztri. She was previously on Pulmicort nebulizer + Duonebs d/t stroke. She had a routine chest x-ray on 12/28/2019 showed coarse bibasilar interstitial opacities, more conspicuous than prior study.  This was independently reviewed by Dr. Elsworth Soho, no new infiltrates or effusion, left upper lobe calcified granuloma is prior.  She is doing alright today. She reports being more shortness of breath in the afternoons. She does not feel Breztri inhaler is lasting long enough. Her breathing is pretty good in the morning. She takes her medications around 11am. She experiences shortness of breath approx 6 hours later around 5pm in the afternoon. She will use her Proair rescue inhaler which does help. She takes second dose of Breztri around 11pm.   She is currently dealing with allergy symptoms including nasal congestion and PND. She is using simply saline twice a day and Flonase nasal spray. She has a congested cough with difficulty getting mucus up. At times she gets winded walking to bathroom. She is on 2-3L oxygen continuously, she may briefly increase her oxygen when dyspneic. She ambulates with a walker. She does not have much strength in her left hand since stroke. She has difficulty opening containers.   07/04/2020  Patient presents today for 3 month follow-up. She had a hard time getting to her appointment today. She had an episode  of shortness of breath at home today. She notes that her oxygen was 92% on 2.5L and HR was 122. She has a history of permanent afib. She has trouble with her breathing in hot/humid weather. She currently has a cough and allergy symptoms. She is using BREZTRI twice a day. She is on 3L oxygen. She is still having bladder problems, states that she has difficulty starting stream and has some dribbling. She has been seen by Urology in the past.   10/30/2020  Patient presents today for 3-4 month follow-up. She is doing alright breathing wise. She has new leg weakness, states that when she sits on a toilet seat her upper thighs will go numb.  She gets weak walking around the house. She is in a wheel chair today. She follows with Dr. Brett Fairy for vascular dementia, . She also has had some rectal bleeding and PCP advised her to start a clear liquid diet. She will be following up with them next week for annual exam.  In terms of her COPD, her breathing is mostly stable. She has a cough felt to be related to allergies. She is on Advair. Usually uses albuterol inhaler once a day in the afternoon. She is on 3L oxygen continuously. She can not tolerate oral antihistamines d/t drowsiness. She did not tolerated Breztri d/t reported urinary retention. She has been using flonase at bedtime and uses saline nasal rinses during the day. She takes mucinex twice a day.   12/18/2020 - Interim hx  Patient presents today for acute OV. In November 2022 patient reported increase in  urinary incontinence. Urinary incontinence is not a known side effect of Advair or Breztri. Recommend checking for UTI or following up with urologist. Patient elected to trial back on Breztri and RX was sent to pharmacy. She called our office yesterday with reports of increased shortness of breath, needed to increase O2 to 3L. Associated cough and chest congestion. She was seen in ED on thanksgiving d.t chest pressure. CXR on 12/06/20 showed no active  disease. Calcified granulomas within the lung were again noted. She also reports chest palpitations, she has spoken with cardiology and no changes were recommended.   Accompanied by care taker. She is not feeling well today. Over the last 4-6 weeks she has noticed increased dyspnea symptoms along with some upper airway/nasal congestion. No specific cough. She also reports associated fatigue, weakness, shakiness and diaphoresis with any exertion. Oxygen was noted to drop into mid 80s on 2.5L, she increased O2 to 3L.  Today her oxygen level was 100% on 3L. WBC has been consistently elevated. BC in ED on 12/06/20 were negative. She was given levqauin for UTI in mid-November. She will occasionally still have burning when she voids.    Significant tests/ events CT angio chest 07/2015 >> no pulmonary embolism, no emphysema, calcified mediastinal LNs PFTs 09/2015>> severe airway obstruction with FEV1 40%, improves to 47% with albuterol, DLCO decreased to 45%   Allergies  Allergen Reactions   Prozac [Fluoxetine Hcl] Other (See Comments)    seizures   Sulfa Antibiotics Nausea And Vomiting and Other (See Comments)    TINGLING IN LEGS ALSO   Micardis [Telmisartan] Swelling and Other (See Comments)    Swelling, bruising Swelling, bruising   Prednisone Other (See Comments)    mood changes Mood changes   Lisinopril-Hydrochlorothiazide Other (See Comments)    unknown   Augmentin [Amoxicillin-Pot Clavulanate] Rash   Avelox [Moxifloxacin Hcl In Nacl] Rash    LEVAQUIN IS OK-SEE NOTE   Norvasc [Amlodipine Besylate] Other (See Comments)    fatigue    Immunization History  Administered Date(s) Administered   Influenza Split 01/03/2008, 11/06/2008   Influenza, High Dose Seasonal PF 11/22/2013, 11/27/2014, 10/28/2016, 10/27/2017, 09/14/2018, 10/24/2019   Influenza,inj,Quad PF,6+ Mos 10/19/2018   Influenza-Unspecified 10/08/2015, 11/05/2020   PFIZER(Purple Top)SARS-COV-2 Vaccination 02/02/2019,  02/23/2019, 11/26/2019   Pneumococcal Conjugate-13 11/22/2013   Pneumococcal Polysaccharide-23 05/05/2008   Td 05/22/2003   Tdap 08/20/2012   Zoster, Live 08/20/2012    Past Medical History:  Diagnosis Date   Afib ([place])    diagnosed 05/17/19   Arthritis    BRCA2 positive 2017   COPD (chronic obstructive pulmonary disease) ([place])    Diabetes mellitus without complication ([place])    Diverticulosis    Dysrhythmia    Afib   Hearing loss    HTN (hypertension)    Memory loss    Pre-diabetes    Seasonal depression ([place])    Seizure disorder, complex partial, 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    Vestibulitis of ear     Tobacco History: Social History   Tobacco Use  Smoking Status Former   Packs/day: 1.00   Years: 41.00   Pack years: 41.00   Types: Cigarettes   Quit date: 01/13/2006   Years since quitting: 14.9  Smokeless Tobacco Never   Counseling given: Not Answered   Outpatient Medications  Prior to Visit  Medication Sig Dispense Refill   acetaminophen (TYLENOL) 325 MG tablet Take 1-2 tablets (325-650 mg total) by mouth every 4 (four) hours as needed for mild pain.     albuterol (PROAIR HFA) 108 (90 Base) MCG/ACT inhaler Inhale 2 puffs into the lungs every 6 (six) hours as needed for wheezing or shortness of breath. 24 g 1   aspirin EC 81 MG tablet Take 81 mg by mouth daily.     atorvastatin (LIPITOR) 20 MG tablet TAKE 1 TABLET DAILY 90 tablet 3   azithromycin (ZITHROMAX) 250 MG tablet Take 1 tablet (250 mg total) by mouth daily. Take first 2 tablets together, then 1 every day until finished. 6 tablet 0   captopril (CAPOTEN) 50 MG tablet Take 50 mg by mouth 2 (two) times daily.     Cholecalciferol (VITAMIN D3) 2000 units capsule Take 2,000 Units by mouth daily.      clonazePAM (KLONOPIN) 2 MG tablet Take 1 tablet at  bedtime (seizure and insomnia) may take additional 0.5 tablet if necessary for panic attack. Our goal is to try and slowly wean down to no more than 1 tab a day. 135 tablet 0   diltiazem (CARDIZEM CD) 360 MG 24 hr capsule Take 1 capsule (360 mg total) by mouth daily. 90 capsule 3   ELIQUIS 5 MG TABS tablet TAKE 1 TABLET TWICE A DAY 180 tablet 3   famotidine (PEPCID) 20 MG tablet Take 1 tablet (20 mg total) by mouth 2 (two) times daily. 60 tablet 0   fluticasone (FLONASE) 50 MCG/ACT nasal spray Place 1 spray into the nose daily as needed for allergies. Spray twice daily as needed     gabapentin (NEURONTIN) 600 MG tablet Take 0.5 tablets (300 mg total) by mouth at bedtime. 45 tablet 3   ipratropium-albuterol (DUONEB) 0.5-2.5 (3) MG/3ML SOLN 3 ml as needed     levETIRAcetam (KEPPRA) 750 MG tablet Take 1 tablet (750 mg total) by mouth 2 (two) times daily. 180 tablet 3   LEXAPRO 20 MG tablet Take 1 tablet (20 mg total) by mouth daily. Morning dosage 90 tablet 3   memantine (NAMENDA) 5 MG tablet Take 1 tablet (5 mg total) by mouth 2 (two) times daily. 60 tablet 6   metFORMIN (GLUCOPHAGE) 500 MG tablet Take 500 mg by mouth daily.     metoprolol tartrate (LOPRESSOR) 25 MG tablet Take 25 mg by mouth as needed.     polyethylene glycol (MIRALAX / GLYCOLAX) 17 g packet Take 17 g by mouth daily. Purchase bottle over the counter and use capful daily 14 each 0   sodium chloride (OCEAN) 0.65 % SOLN nasal spray Place 1 spray into both nostrils in the morning, at noon, in the evening, and at bedtime.  0   Spacer/Aero-Holding Chambers (AEROCHAMBER PLUS WITH MASK) inhaler As needed with inhaler 1 each 0   vitamin B-12 (CYANOCOBALAMIN) 500 MCG tablet Take 1 tablet (500 mcg total) by mouth daily.     Budeson-Glycopyrrol-Formoterol (BREZTRI AEROSPHERE) 160-9-4.8 MCG/ACT AERO Inhale 2 puffs into the lungs in the morning and at bedtime. 10.7 g 4   fexofenadine (ALLEGRA ALLERGY) 60 MG tablet Take 1 tablet (60 mg total) by  mouth daily. 30 tablet 3   hydrocerin (EUCERIN) CREA Apply 1 application topically 2 (two) times daily.  0   Menthol-Methyl Salicylate (MUSCLE RUB) 10-15 % CREA Apply 1 application topically as needed for muscle pain. To neck and shoulder  0   predniSONE (DELTASONE)  10 MG tablet Take 2 tabs x 5 days 10 tablet 0   No facility-administered medications prior to visit.   Review of Systems  Review of Systems  Constitutional:  Positive for diaphoresis and fatigue.  HENT: Negative.    Respiratory:  Positive for cough and shortness of breath.   Cardiovascular: Negative.     Physical Exam  BP 114/62 (BP Location: Right Wrist, Patient Position: Sitting, Cuff Size: Normal)   Pulse 74   Temp 97.7 F (36.5 C) (Oral)   Ht _0  (1.626 m)   Wt 232 lb (105.2 kg)   LMP 01/14/1995 (Approximate)   SpO2 100%   BMI 39.82 kg/m  Physical Exam Constitutional:      General: She is not in acute distress.    Appearance: Normal appearance. She is diaphoretic.  HENT:     Head: Normocephalic and atraumatic.  Cardiovascular:     Rate and Rhythm: Normal rate and regular rhythm.  Pulmonary:     Breath sounds: Rhonchi present.     Comments: Rhonchi right lung Musculoskeletal:        General: Normal range of motion.  Skin:    General: Skin is warm.  Neurological:     General: No focal deficit present.     Mental Status: She is alert and oriented to person, place, and time. Mental status is at baseline.  Psychiatric:        Mood and Affect: Mood normal.        Behavior: Behavior normal.        Thought Content: Thought content normal.        Judgment: Judgment normal.     Lab Results:  CBC    Component Value Date/Time   WBC 14.8 (H) 12/06/2020 1510   RBC 4.41 12/06/2020 1510   HGB 13.7 12/06/2020 1510   HGB 13.5 08/02/2020 1221   HCT 43.1 12/06/2020 1510   HCT 41.4 08/02/2020 1221   PLT 313 12/06/2020 1510   PLT 317 08/02/2020 1221   MCV 97.7 12/06/2020 1510   MCV 96 08/02/2020 1221    MCH 31.1 12/06/2020 1510   MCHC 31.8 12/06/2020 1510   RDW 14.6 12/06/2020 1510   RDW 12.9 08/02/2020 1221   LYMPHSABS 1.2 12/06/2020 1510   MONOABS 1.1 (H) 12/06/2020 1510   EOSABS 0.1 12/06/2020 1510   BASOSABS 0.1 12/06/2020 1510    BMET    Component Value Date/Time   NA 138 12/06/2020 1510   NA 146 (H) 08/02/2020 1221   K 3.7 12/06/2020 1510   CL 97 (L) 12/06/2020 1510   CO2 33 (H) 12/06/2020 1510   GLUCOSE 104 (H) 12/06/2020 1510   BUN 9 12/06/2020 1510   BUN 13 08/02/2020 1221   CREATININE 0.73 12/06/2020 1510   CREATININE 0.85 02/22/2018 1051   CALCIUM 9.3 12/06/2020 1510   GFRNONAA >60 12/06/2020 1510   GFRNONAA >60 02/22/2018 1051   GFRAA >60 06/20/2019 0657   GFRAA >60 02/22/2018 1051    BNP    Component Value Date/Time   BNP 41.0 12/06/2020 1526    ProBNP No results found for: PROBNP  Imaging: DG Chest Port 1 View  Result Date: 12/06/2020 CLINICAL DATA:  Shortness of breath EXAM: PORTABLE CHEST 1 VIEW COMPARISON:  07/04/2020 FINDINGS: UPPER limits normal heart size again noted. There is no evidence of focal airspace disease, pulmonary edema, suspicious pulmonary nodule/mass, pleural effusion, or pneumothorax. Calcified granulomas within the lungs again noted. No acute bony abnormalities are identified. IMPRESSION:  No active disease. Electronically Signed   By: Margarette Canada M.D.   On: 12/06/2020 16:01   LONG TERM MONITOR (3-14 DAYS)  Result Date: 12/16/2020  Atrial fibrillation with overall, adequate rate control. Average HR 88 bpm.  Rare PVCs that were associated with patient symptoms at times.  No significant pauses.  Patch Wear Time:  3 days and 0 hours (2022-11-20T16:00:30-0500 to 2022-11-23T16:00:52-0500) Atrial Fibrillation occurred continuously (100% burden), ranging from 59-135 bpm (avg of 88 bpm). Isolated VEs were rare (<1.0%), and no VE Couplets or VE Triplets were present.     Assessment & Plan:   COPD (chronic obstructive pulmonary disease)  ([place]) - Patient reports increased dyspnea symptoms over the last 4-6 weeks. Associated upper airway congestion along with other vague symptoms of diaphoresis, weakness and feeling shaky.  - She has been on several diffent inhalers in the past including pulmicort, Anoro and recently [employer]. She continues to prefer Advair to any other inhaler as of today's visit. We will have her resume Advair HFA 230-6mg two puffs BID. Advised against changing maintenance inhaler so frequently.  - We will check CT chest wo contrast d.t shortness of breath and cough. She had some rhonchi to right lung on exam along with persistent leukocytosis. CXR on 12/06/20 showed no acute disease, calcified granulomas within the lung.  - Since she just recently finished course of Levaquin for UTI we will could off on further abx until testing comes back  - She has scheduled follow-up in January with Dr. AElsworth Soho  Dysuria - Checking urine culture  Leukocytosis - WBC remains consistently elevated over the last several months. BC were negative on 11/24 - Check cbc with diff, urine culture and CT chest wo contrast - If unable to identify source of leukocytosis we will refer to hematology  Chronic respiratory failure ([place] - Stable; O2 100% on 3L  40 mins spent on case: > 50% face to face  EMartyn Ehrich NP 12/18/2020

## 2020-12-19 LAB — CBC WITH DIFFERENTIAL/PLATELET
Basophils Absolute: 0.1 10*3/uL (ref 0.0–0.1)
Basophils Relative: 0.5 % (ref 0.0–3.0)
Eosinophils Absolute: 0 10*3/uL (ref 0.0–0.7)
Eosinophils Relative: 0.2 % (ref 0.0–5.0)
HCT: 41.4 % (ref 36.0–46.0)
Hemoglobin: 13 g/dL (ref 12.0–15.0)
Lymphocytes Relative: 5 % — ABNORMAL LOW (ref 12.0–46.0)
Lymphs Abs: 0.9 10*3/uL (ref 0.7–4.0)
MCHC: 31.3 g/dL (ref 30.0–36.0)
MCV: 98.5 fl (ref 78.0–100.0)
Monocytes Absolute: 1.3 10*3/uL — ABNORMAL HIGH (ref 0.1–1.0)
Monocytes Relative: 7 % (ref 3.0–12.0)
Neutro Abs: 15.8 10*3/uL — ABNORMAL HIGH (ref 1.4–7.7)
Neutrophils Relative %: 87.3 % — ABNORMAL HIGH (ref 43.0–77.0)
Platelets: 267 10*3/uL (ref 150.0–400.0)
RBC: 4.21 Mil/uL (ref 3.87–5.11)
RDW: 15.3 % (ref 11.5–15.5)
WBC: 18.1 10*3/uL (ref 4.0–10.5)

## 2020-12-19 LAB — BASIC METABOLIC PANEL
BUN: 12 mg/dL (ref 6–23)
CO2: 32 mEq/L (ref 19–32)
Calcium: 9.9 mg/dL (ref 8.4–10.5)
Chloride: 96 mEq/L (ref 96–112)
Creatinine, Ser: 0.68 mg/dL (ref 0.40–1.20)
GFR: 82.88 mL/min (ref >60.00)
Glucose, Bld: 98 mg/dL (ref 70–99)
Potassium: 5.1 mEq/L (ref 3.5–5.1)
Sodium: 137 mEq/L (ref 135–145)

## 2020-12-19 LAB — URINE CULTURE
MICRO NUMBER:: 12720439
Result:: NO GROWTH
SPECIMEN QUALITY:: ADEQUATE

## 2020-12-19 LAB — BRAIN NATRIURETIC PEPTIDE: Pro B Natriuretic peptide (BNP): 54 pg/mL (ref 0.0–100.0)

## 2020-12-19 MED ORDER — ADVAIR HFA 230-21 MCG/ACT IN AERO: 2.0000 | INHALATION_SPRAY | Freq: Two times a day (BID) | RESPIRATORY_TRACT | 5 refills | Status: DC

## 2020-12-19 NOTE — Addendum Note (Signed)
Addended by: Dessie Coma on: 12/19/2020 10:52 AM   Modules accepted: Orders

## 2020-12-20 NOTE — Progress Notes (Signed)
Urine culture was negative. Please make sure she is going to start zpack. I am out of office next 4 days, please send any messages to [facility] or DOD.

## 2020-12-21 ENCOUNTER — Other Ambulatory Visit: Payer: Self-pay

## 2020-12-21 ENCOUNTER — Ambulatory Visit (HOSPITAL_COMMUNITY)
Admission: RE | Admit: 2020-12-21 | Discharge: 2020-12-21 | Disposition: A | Discharge status: Home / Self Care | Payer: Medicare Other | Source: Ambulatory Visit | Attending: Primary Care | Admitting: Primary Care

## 2020-12-21 DIAGNOSIS — J439 Emphysema, unspecified: Secondary | ICD-10-CM | POA: Diagnosis not present

## 2020-12-21 DIAGNOSIS — J449 Chronic obstructive pulmonary disease, unspecified: Secondary | ICD-10-CM | POA: Diagnosis not present

## 2020-12-21 DIAGNOSIS — J441 Chronic obstructive pulmonary disease with (acute) exacerbation: Secondary | ICD-10-CM | POA: Diagnosis not present

## 2020-12-21 DIAGNOSIS — R911 Solitary pulmonary nodule: Secondary | ICD-10-CM | POA: Diagnosis not present

## 2020-12-21 DIAGNOSIS — R0602 Shortness of breath: Secondary | ICD-10-CM | POA: Insufficient documentation

## 2020-12-21 DIAGNOSIS — R918 Other nonspecific abnormal finding of lung field: Secondary | ICD-10-CM | POA: Diagnosis not present

## 2020-12-21 DIAGNOSIS — I700 Atherosclerosis of aorta: Secondary | ICD-10-CM | POA: Diagnosis not present

## 2020-12-24 ENCOUNTER — Encounter: Payer: Self-pay | Admitting: Family Medicine

## 2020-12-24 ENCOUNTER — Telehealth: Payer: Self-pay | Admitting: Primary Care

## 2020-12-24 NOTE — Telephone Encounter (Signed)
Primary Pulmonologist: Elsworth Soho Last office visit and with whom: 12/18/20 What do we see them for (pulmonary problems): COPD Last OV assessment/plan: Recommendations: Stop Breztri Resume Advair 230 two puffs morning and evening  Use AYR saline nasal gel twice a day We may want to refer to hematology if we can not find source of elevated WBCs   Was appointment offered to patient (explain)?  Declined   Reason for call: Patient daughter called to let us and wanted [place] to know that her mom has had a lot of Diarrhea since being on the [place] and she wants to know what you recommend to help with the Hemorids. Please advise  (examples of things to ask: : When did symptoms start? Fever? Cough? Productive? Color to sputum? More sputum than usual? Wheezing? Have you needed increased oxygen? Are you taking your respiratory medications? What over the counter measures have you tried?)  Allergies  Allergen Reactions   Prozac [Fluoxetine Hcl] Other (See Comments)    seizures   Sulfa Antibiotics Nausea And Vomiting and Other (See Comments)    TINGLING IN LEGS ALSO   Micardis [Telmisartan] Swelling and Other (See Comments)    Swelling, bruising Swelling, bruising   Prednisone Other (See Comments)    mood changes Mood changes   Lisinopril-Hydrochlorothiazide Other (See Comments)    unknown   Augmentin [Amoxicillin-Pot Clavulanate] Rash   Avelox [Moxifloxacin Hcl In Nacl] Rash    LEVAQUIN IS OK-SEE NOTE   Norvasc [Amlodipine Besylate] Other (See Comments)    fatigue    Immunization History  Administered Date(s) Administered   Influenza Split 01/03/2008, 11/06/2008   Influenza, High Dose Seasonal PF 11/22/2013, 11/27/2014, 10/28/2016, 10/27/2017, 09/14/2018, 10/24/2019   Influenza,inj,Quad PF,6+ Mos 10/19/2018   Influenza-Unspecified 10/08/2015, 11/05/2020   PFIZER(Purple Top)SARS-COV-2 Vaccination 02/02/2019, 02/23/2019, 11/26/2019   Pneumococcal Conjugate-13 11/22/2013   Pneumococcal  Polysaccharide-23 05/05/2008   Td 05/22/2003   Tdap 08/20/2012   Zoster, Live 08/20/2012

## 2020-12-24 NOTE — Telephone Encounter (Signed)
Called and spoke with patient's daughter [name]. She verbalized understanding of SG's recommendations.   Nothing further needed at time of call.

## 2020-12-24 NOTE — Telephone Encounter (Signed)
Call made to [facility], placed on hold for 27 minutes. Will have to try back. Will hold in triage.

## 2020-12-25 NOTE — Telephone Encounter (Signed)
Call made to [facility]:  [facility] there is nothing wrong with the order they just wanted her to know it will not ship until December 23rd.

## 2020-12-26 ENCOUNTER — Telehealth: Payer: Self-pay | Admitting: Primary Care

## 2020-12-26 NOTE — Telephone Encounter (Signed)
Beth [name] is not on pt's DPR Called and spoke with the pt She gave verbal permission for [place] to speak with her daughter Beth [name]  I encouraged her to ask for new DPR form to fill out and sign next time she is here   [name], please advise on CT results and we will call Beth [name], thanks!

## 2020-12-26 NOTE — Telephone Encounter (Signed)
Called and spoke with Beth [name]. She verbalized understanding of results. She is aware that once [name] has talked to Dr. Elsworth Soho, we will give her a call back.   Nothing further needed at time of call.

## 2020-12-26 NOTE — Telephone Encounter (Signed)
Please let her know there there was a concerning area right lung for pneumonia and new pulmonary nodule (this about be inflammatory but is concerning d/t smoking hx), I am discussing with Dr. Elsworth Soho regarding next recommendations. May consider abx vs additional testing

## 2020-12-28 ENCOUNTER — Encounter: Payer: Self-pay | Admitting: Nurse Practitioner

## 2020-12-28 ENCOUNTER — Ambulatory Visit (INDEPENDENT_AMBULATORY_CARE_PROVIDER_SITE_OTHER): Payer: Medicare Other | Admitting: Nurse Practitioner

## 2020-12-28 ENCOUNTER — Other Ambulatory Visit: Payer: Self-pay

## 2020-12-28 ENCOUNTER — Telehealth: Payer: Self-pay | Admitting: Pulmonary Disease

## 2020-12-28 VITALS — BP 112/72 | HR 58 | Temp 98.2°F | Ht 64.0 in | Wt 226.0 lb

## 2020-12-28 DIAGNOSIS — J9611 Chronic respiratory failure with hypoxia: Secondary | ICD-10-CM

## 2020-12-28 DIAGNOSIS — J479 Bronchiectasis, uncomplicated: Secondary | ICD-10-CM | POA: Insufficient documentation

## 2020-12-28 DIAGNOSIS — R9389 Abnormal findings on diagnostic imaging of other specified body structures: Secondary | ICD-10-CM | POA: Diagnosis not present

## 2020-12-28 DIAGNOSIS — I480 Paroxysmal atrial fibrillation: Secondary | ICD-10-CM

## 2020-12-28 DIAGNOSIS — J470 Bronchiectasis with acute lower respiratory infection: Secondary | ICD-10-CM | POA: Insufficient documentation

## 2020-12-28 DIAGNOSIS — R911 Solitary pulmonary nodule: Secondary | ICD-10-CM | POA: Insufficient documentation

## 2020-12-28 DIAGNOSIS — J301 Allergic rhinitis due to pollen: Secondary | ICD-10-CM | POA: Diagnosis not present

## 2020-12-28 DIAGNOSIS — J449 Chronic obstructive pulmonary disease, unspecified: Secondary | ICD-10-CM

## 2020-12-28 MED ORDER — LEVOFLOXACIN 500 MG PO TABS: 500.0000 mg | ORAL_TABLET | Freq: Every day | ORAL | 0 refills | Status: AC

## 2020-12-28 MED ORDER — SODIUM CHLORIDE 3 % IN NEBU
INHALATION_SOLUTION | Freq: Two times a day (BID) | RESPIRATORY_TRACT | 3 refills | Status: DC
Start: 2020-12-28 — End: 2021-03-08

## 2020-12-28 MED ORDER — GUAIFENESIN ER 600 MG PO TB12: 1200.0000 mg | ORAL_TABLET | Freq: Two times a day (BID) | ORAL | 0 refills | Status: AC

## 2020-12-28 NOTE — Assessment & Plan Note (Signed)
Currently rate controlled. Follow up with cardiology as scheduled.

## 2020-12-28 NOTE — Telephone Encounter (Signed)
I spoke with Beth [name] and she is concerned about pt with worsening SOB and also upset has not received CT results. I have scheduled her to see Katie at 3 pm today for eval and to review results.

## 2020-12-28 NOTE — Progress Notes (Signed)
° °@Patient ID: Beth [name], female    DOB: 10/14/1940, 80 y.o.   MRN: [id number] ° °Chief Complaint  °Patient presents with  ° Follow-up  °  Patient reports she has felt weak and short of breath x 2 weeks,.   ° ° °Referring provider: °Miller, Lisa, MD ° °HPI: °80-year-old female, former smoker (41 pack years) followed for COPD and chronic respiratory failure on supplemental O2.  She is a patient of Dr. Alva's and was last seen on 12/18/2020 by Walsh NP.  Past medical history significant for permanent A. fib, hypertension, organic brain syndrome, stroke, BRCA2 positive, obesity. ° °TEST/EVENTS:  °10/05/2015 PFTs:  °11/04/2018 echocardiogram: LVEF 65 to 70%.  LV diastolic dysfunction with impaired relaxation.  RV normal function and size.  Left atrial size mildly dilated.  Mild MV regurgitation. °05/17/2019 echocardiogram: LVEF 55 to 60%.  LV diastolic function could not be evaluated.  RV function not well-visualized.  MV grossly normal with mild regurgitation. °12/06/2020 CXR 1 view: Calcified granulomas within the lungs again noted.  No evidence of focal airspace disease, pulmonary edema, suspicious pulmonary nodule/mass, pleural effusion or pneumothorax °12/22/2018 CT chest without contrast: Heart mildly enlarged.  Atherosclerosis.  Pulmonary trunk is distending suggesting underlying pulmonary artery hypertension.  Calcified lymph nodes are present in the mediastinum.  Emphysematous changes are present of the lungs.  Bilateral calcified granuloma.  Patchy atelectasis or infiltrate is present at the lung bases bilaterally and there is mild consolidation in the right lower lobe.  Scattered pulmonary nodules are seen in the right lower lobe, the largest measuring up to 1.1 cm in diameter.  Complex hypodensity in the upper pole of the left kidney measuring 1.3 cm ° °12/18/2020: OV with Walsh NP.  In November 2022 reported increase in urinary incontinence -advised this is not a known side effect of Advair Breztri.   Recommended checking for UTI and following up with urologist.  Previously elected to trial back on Breztri.  On 12/5, reported increased O2 needs to 3 L, cough and congestion, worsening dyspnea.  WBC consistently elevated.  Blood cultures in the ED on 12/06/2020 were negative and CXR showed no acute disease, calcified granulomas within the lung.  Treated mid November with Levaquin for UTI. At this visit, reported preferring Advair over Breztri so changed to Advair 230.  CT chest without contrast 12/9 - bronchiectasis at the bases likely related to mucoid impaction.  Obtained urine culture due to dysuria.  Repeat CBC with differential which showed WBC 18.1.  ° °12/28/2020: Today - follow up °Patient presents today with daughter to discuss CT chest results. The findings were consistent with bronchiectasis likely related to mucoid impaction. She reports continued shortness of breath with exertion, PND, and generalized weakness. Her cough is productive at times with clear sputum. She occasionally becomes diaphoretic and shaky. She does experience intermittent episodes of wheezing, relieved with rescue inhaler. She has maintained on supplemental O2 at 3 lpm with saturations >90%. She denies orthopnea, chest pain, lower extremity swelling or hemoptysis. She continues on Advair Twice daily, which she prefers over Breztri, flonase nasal spray, Allegra, and saline nasal spray. She is going to follow up with her PCP regarding the density on her left kidney, which was an incidental finding on her CT chest. Overall, she has not had any improvement in her symptoms since her last visit.  ° °Allergies  °Allergen Reactions  ° Prozac [Fluoxetine Hcl] Other (See Comments)  °  seizures  ° Sulfa Antibiotics Nausea And Vomiting   and Other (See Comments)  °  TINGLING IN LEGS ALSO  ° Micardis [Telmisartan] Swelling and Other (See Comments)  °  Swelling, bruising °Swelling, bruising  ° Prednisone Other (See Comments)  °  mood changes °Mood  changes  ° Augmentin [Amoxicillin-Pot Clavulanate] Rash  ° Avelox [Moxifloxacin Hcl In Nacl] Rash  °  LEVAQUIN IS OK-SEE NOTE  ° Lisinopril-Hydrochlorothiazide Other (See Comments)  °  unknown  ° Norvasc [Amlodipine Besylate] Other (See Comments)  °  fatigue  ° ° °Immunization History  °Administered Date(s) Administered  ° Influenza Split 01/03/2008, 11/06/2008  ° Influenza, High Dose Seasonal PF 11/22/2013, 11/27/2014, 10/28/2016, 10/27/2017, 09/14/2018, 10/24/2019  ° Influenza,inj,Quad PF,6+ Mos 10/19/2018  ° Influenza-Unspecified 10/08/2015, 11/05/2020  ° PFIZER(Purple Top)SARS-COV-2 Vaccination 02/02/2019, 02/23/2019, 11/26/2019  ° Pneumococcal Conjugate-13 11/22/2013  ° Pneumococcal Polysaccharide-23 05/05/2008  ° Td 05/22/2003  ° Tdap 08/20/2012  ° Zoster, Live 08/20/2012  ° ° °Past Medical History:  °Diagnosis Date  ° Afib (HCC)   ° diagnosed 05/17/19  ° Arthritis   ° BRCA2 positive 2017  ° COPD (chronic obstructive pulmonary disease) (HCC)   ° Diabetes mellitus without complication (HCC)   ° Diverticulosis   ° Dysrhythmia   ° Afib  ° Hearing loss   ° HTN (hypertension)   ° Memory loss   ° Pre-diabetes   ° Seasonal depression (HCC)   ° Seizure disorder, complex partial, 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   ° Vestibulitis of ear   ° ° °Tobacco History: °Social History  ° °Tobacco Use  °Smoking Status Former  ° Packs/day: 1.00  ° Years: 41.00  ° Pack years: 41.00  ° Types: Cigarettes  ° Quit date: 01/13/2006  ° Years since quitting: 14.9  °Smokeless Tobacco Never  ° °Counseling given: Not Answered ° ° °Outpatient Medications Prior to Visit  °Medication Sig Dispense Refill  ° acetaminophen (TYLENOL) 325 MG tablet Take 1-2 tablets (325-650 mg total) by mouth every 4 (four) hours as needed for mild pain.    ° Albuterol Sulfate, sensor, (PROAIR DIGIHALER) 108  (90 Base) MCG/ACT AEPB Inhale 1 puff into the lungs as needed.    ° aspirin EC 81 MG tablet Take 81 mg by mouth daily.    ° atorvastatin (LIPITOR) 20 MG tablet TAKE 1 TABLET DAILY 90 tablet 3  ° captopril (CAPOTEN) 50 MG tablet Take 50 mg by mouth 2 (two) times daily.    ° Cholecalciferol (VITAMIN D3) 2000 units capsule Take 2,000 Units by mouth daily.     ° clonazePAM (KLONOPIN) 2 MG tablet Take 1 tablet at bedtime (seizure and insomnia) may take additional 0.5 tablet if necessary for panic attack. Our goal is to try and slowly wean down to no more than 1 tab a day. (Patient taking differently: as needed. Take 1 tablet at bedtime (seizure and insomnia) may take additional 0.5 tablet if necessary for panic attack. Our goal is to try and slowly wean down to no more than 1 tab a day.) 135 tablet 0  ° diltiazem (CARDIZEM CD) 360 MG 24 hr capsule Take 1 capsule (360 mg total) by mouth daily. 90 capsule 3  ° ELIQUIS 5 MG TABS tablet TAKE 1 TABLET TWICE A DAY 180 tablet 3  ° famotidine (PEPCID) 20 MG tablet Take 1 tablet (20 mg total) by   mouth 2 (two) times daily. 60 tablet 0  ° fexofenadine (ALLEGRA) 180 MG tablet 1 tablet Swallow whole with water; do not take with fruit juices.    ° fluticasone (FLONASE) 50 MCG/ACT nasal spray Place 1 spray into the nose daily as needed for allergies. Spray twice daily as needed    ° fluticasone-salmeterol (ADVAIR HFA) 230-21 MCG/ACT inhaler Inhale 2 puffs into the lungs 2 (two) times daily. 36 g 5  ° gabapentin (NEURONTIN) 600 MG tablet Take 0.5 tablets (300 mg total) by mouth at bedtime. 45 tablet 3  ° LEXAPRO 20 MG tablet Take 1 tablet (20 mg total) by mouth daily. Morning dosage 90 tablet 3  ° memantine (NAMENDA) 5 MG tablet Take 1 tablet (5 mg total) by mouth 2 (two) times daily. 60 tablet 6  ° metFORMIN (GLUCOPHAGE) 500 MG tablet Take 500 mg by mouth daily.    ° metoprolol tartrate (LOPRESSOR) 25 MG tablet Take 25 mg by mouth as needed.    ° polyethylene glycol (MIRALAX /  GLYCOLAX) 17 g packet Take 17 g by mouth daily. Purchase bottle over the counter and use capful daily 14 each 0  ° psyllium (METAMUCIL) 28 % packet 1 packet with 8 ounces of liquid as needed    ° Spacer/Aero-Holding Chambers (AEROCHAMBER PLUS WITH MASK) inhaler As needed with inhaler 1 each 0  ° vitamin B-12 (CYANOCOBALAMIN) 500 MCG tablet Take 1 tablet (500 mcg total) by mouth daily.    ° albuterol (PROAIR HFA) 108 (90 Base) MCG/ACT inhaler Inhale 2 puffs into the lungs every 6 (six) hours as needed for wheezing or shortness of breath. (Patient not taking: Reported on 12/28/2020) 24 g 1  ° azithromycin (ZITHROMAX) 250 MG tablet Take 1 tablet (250 mg total) by mouth daily. Take first 2 tablets together, then 1 every day until finished. (Patient not taking: Reported on 12/28/2020) 6 tablet 0  ° Budeson-Glycopyrrol-Formoterol (BREZTRI AEROSPHERE) 160-9-4.8 MCG/ACT AERO 2 puffs (Patient not taking: Reported on 12/28/2020)    ° clonazePAM (KLONOPIN) 2 MG tablet 1 tablet at bedtime. (Patient not taking: Reported on 12/28/2020)    ° ipratropium-albuterol (DUONEB) 0.5-2.5 (3) MG/3ML SOLN 3 ml as needed (Patient not taking: Reported on 12/28/2020)    ° levETIRAcetam (KEPPRA) 750 MG tablet Take 1 tablet (750 mg total) by mouth 2 (two) times daily. (Patient not taking: Reported on 12/28/2020) 180 tablet 3  ° levETIRAcetam (KEPPRA) 750 MG tablet 1 tablet (Patient not taking: Reported on 12/28/2020)    ° levofloxacin (LEVAQUIN) 500 MG tablet Take 500 mg by mouth daily. (Patient not taking: Reported on 12/28/2020)    ° sodium chloride (OCEAN) 0.65 % SOLN nasal spray Place 1 spray into both nostrils in the morning, at noon, in the evening, and at bedtime. (Patient not taking: Reported on 12/28/2020)  0  ° °No facility-administered medications prior to visit.  ° ° ° °Review of Systems:  ° °Constitutional: No weight loss or gain, night sweats, fevers, chills. +fatigue, generalized weakness, occasional diaphoresis °HEENT: No  headaches, difficulty swallowing, tooth/dental problems, or sore throat. No sneezing, itching, ear ache, nasal congestion, or post nasal drip °CV:  +PND. No chest pain, orthopnea, swelling in lower extremities, anasarca, dizziness, palpitations, syncope °Resp: +shortness of breath with exertion; productive cough with clear sputum; occasional wheeze. No excess mucus or change in color of mucus. No hemoptysis. No chest wall deformity °GI:  No heartburn, indigestion, abdominal pain, nausea, vomiting, diarrhea, change in bowel habits, loss of appetite, bloody stools.  °  GU: No dysuria, change in color of urine, urgency or frequency.  No flank pain, no hematuria  °Skin: No rash, lesions, ulcerations °MSK:  No joint pain or swelling.  No decreased range of motion.  No back pain. °Neuro: No dizziness or lightheadedness.  °Psych: No depression or anxiety. Mood stable.  ° ° ° °Physical Exam: ° °BP 112/72 (BP Location: Left Arm, Patient Position: Sitting, Cuff Size: Normal)    Pulse (!) 58    Temp 98.2 °F (36.8 °C) (Oral)    Ht 5' 4" (1.626 m)    Wt 226 lb (102.5 kg)    LMP 01/14/1995 (Approximate)    SpO2 97%    BMI 38.79 kg/m²  ° °GEN: Pleasant, interactive, chronically-ill appearing; obese; in no acute distress. °HEENT:  Normocephalic and atraumatic. EACs patent bilaterally. TM pearly gray with present light reflex bilaterally. PERRLA. Sclera white. Nasal turbinates pink, moist and patent bilaterally. No rhinorrhea present. Oropharynx pink and moist, without exudate or edema. No lesions, ulcerations, or postnasal drip.  °NECK:  Supple w/ fair ROM. No JVD present. Normal carotid impulses w/o bruits. Thyroid symmetrical with no goiter or nodules palpated. No lymphadenopathy.   °CV: Irregular rhythm, rate controlled (chronic a fib), no m/r/g, no peripheral edema. Pulses intact, +2 bilaterally. No cyanosis, pallor or clubbing. °PULMONARY:  Unlabored, regular breathing. Diminished bilaterally P w/o wheezes/rales/rhonchi. No  accessory muscle use. No dullness to percussion. On supplemental 3L O2 °GI: BS present and normoactive. Soft, non-tender to palpation. No organomegaly or masses detected. No CVA tenderness. °MSK: No erythema, warmth or tenderness. Cap refil <2 sec all extrem. No deformities or joint swelling noted.  °Neuro: A/Ox3. No focal deficits noted.   °Skin: Warm, no lesions or rashe °Psych: Normal affect and behavior. Judgement and thought content appropriate.  ° ° ° °Lab Results: ° °CBC °   °Component Value Date/Time  ° WBC 18.1 Repeated and verified X2. (HH) 12/18/2020 1626  ° RBC 4.21 12/18/2020 1626  ° HGB 13.0 12/18/2020 1626  ° HGB 13.5 08/02/2020 1221  ° HCT 41.4 12/18/2020 1626  ° HCT 41.4 08/02/2020 1221  ° PLT 267.0 12/18/2020 1626  ° PLT 317 08/02/2020 1221  ° MCV 98.5 12/18/2020 1626  ° MCV 96 08/02/2020 1221  ° MCH 31.1 12/06/2020 1510  ° MCHC 31.3 12/18/2020 1626  ° RDW 15.3 12/18/2020 1626  ° RDW 12.9 08/02/2020 1221  ° LYMPHSABS 0.9 12/18/2020 1626  ° MONOABS 1.3 (H) 12/18/2020 1626  ° EOSABS 0.0 12/18/2020 1626  ° BASOSABS 0.1 12/18/2020 1626  ° ° °BMET °   °Component Value Date/Time  ° NA 137 12/18/2020 1626  ° NA 146 (H) 08/02/2020 1221  ° K 5.1 12/18/2020 1626  ° CL 96 12/18/2020 1626  ° CO2 32 12/18/2020 1626  ° GLUCOSE 98 12/18/2020 1626  ° BUN 12 12/18/2020 1626  ° BUN 13 08/02/2020 1221  ° CREATININE 0.68 12/18/2020 1626  ° CREATININE 0.85 02/22/2018 1051  ° CALCIUM 9.9 12/18/2020 1626  ° GFRNONAA >60 12/06/2020 1510  ° GFRNONAA >60 02/22/2018 1051  ° GFRAA >60 06/20/2019 0657  ° GFRAA >60 02/22/2018 1051  ° ° °BNP °   °Component Value Date/Time  ° BNP 41.0 12/06/2020 1526  ° ° ° °Imaging: ° °CT Chest Wo Contrast ° °Result Date: 12/21/2020 °CLINICAL DATA:  COPD exacerbation. EXAM: CT CHEST WITHOUT CONTRAST TECHNIQUE: Multidetector CT imaging of the chest was performed following the standard protocol without IV contrast. COMPARISON:  08/08/2015. FINDINGS: Cardiovascular: The heart is   mildly enlarged and  there is no pericardial effusion. Multi-vessel coronary artery calcifications are noted. There is mild calcification of the mitral valve annulus. There is atherosclerotic calcification of the aorta without evidence of aneurysm. The pulmonary trunk is distending suggesting underlying pulmonary artery hypertension. Mediastinum/Nodes: Calcified lymph nodes are present in the mediastinum. No axillary lymphadenopathy is seen. Evaluation of the hila is limited due to lack of IV contrast. The thyroid gland, trachea, and esophagus are within normal limits. Lungs/Pleura: Emphysematous changes are present within the lungs. There are bilateral calcified granuloma. Patchy atelectasis or infiltrate is present at the lung bases bilaterally and there is mild consolidation in the right lower lobe. Scattered pulmonary nodules are seen in the right lower lobe, the largest measuring up to 1.1 cm in diameter, axial image 96. No effusion or pneumothorax. Upper Abdomen: There is a complex hypodensity in the upper pole of the left kidney measuring 1.3 cm. No acute abnormality is identified. Musculoskeletal: Degenerative changes are present in the thoracic spine. No acute or suspicious osseous lesion is identified. IMPRESSION: 1. Patchy airspace disease at the lung bases with consolidation in the right lower lobe, possible atelectasis or infiltrate. 2. Scattered pulmonary nodules in the right lower lobe, the largest measuring 1.1 cm and new from the prior exam. Findings are concerning for neoplastic process. PET-CT and/or biopsy is recommended for further evaluation. 3. Coronary artery calcifications. 4. Aortic atherosclerosis. 5. Complex hypodensity in the upper pole the left kidney. Ultrasound is recommended for further evaluation. Electronically Signed   By: Laura  Taylor M.D.   On: 12/21/2020 21:42  ° °DG Chest Port 1 View ° °Result Date: 12/06/2020 °CLINICAL DATA:  Shortness of breath EXAM: PORTABLE CHEST 1 VIEW COMPARISON:   07/04/2020 FINDINGS: UPPER limits normal heart size again noted. There is no evidence of focal airspace disease, pulmonary edema, suspicious pulmonary nodule/mass, pleural effusion, or pneumothorax. Calcified granulomas within the lungs again noted. No acute bony abnormalities are identified. IMPRESSION: No active disease. Electronically Signed   By: Jeffrey  Hu M.D.   On: 12/06/2020 16:01  ° °LONG TERM MONITOR (3-14 DAYS) ° °Result Date: 12/16/2020 °· Atrial fibrillation with overall, adequate rate control. Average HR 88 bpm. · Rare PVCs that were associated with patient symptoms at times. · No significant pauses.  Patch Wear Time:  3 days and 0 hours (2022-11-20T16:00:30-0500 to 2022-11-23T16:00:52-0500) Atrial Fibrillation occurred continuously (100% burden), ranging from 59-135 bpm (avg of 88 bpm). Isolated VEs were rare (<1.0%), and no VE Couplets or VE Triplets were present.   ° ° ° °PFT Results Latest Ref Rng & Units 10/05/2015  °FVC-Pre L 1.73  °FVC-Predicted Pre % 60  °FVC-Post L 1.88  °FVC-Predicted Post % 65  °Pre FEV1/FVC % % 51  °Post FEV1/FCV % % 54  °FEV1-Pre L 0.89  °FEV1-Predicted Pre % 41  °FEV1-Post L 1.02  °DLCO uncorrected ml/min/mmHg 10.33  °DLCO UNC% % 42  °DLCO corrected ml/min/mmHg 9.75  °DLCO COR %Predicted % 40  °DLVA Predicted % 54  °TLC L 5.38  °TLC % Predicted % 106  °RV % Predicted % 154  ° ° °No results found for: NITRICOXIDE ° ° ° ° ° °Assessment & Plan:  ° °Bronchiectasis with acute lower respiratory infection (HCC) °High symptom burden. Likely related to mucoid impaction. Possible cause of leukocytosis - will need repeat CBC at next visit. Levaquin 500mg for 10 days. Mucolytic agent - mucinex 1200 mg Twice daily, flutter valve, hypertonic nebs Twice daily. HRCT in 8-12 weeks.  ° °  Patient Instructions  -Continue Advair 230 2 puffs Twice daily, brush tongue and rinse mouth afterwards -Continue Allegra daily  -Continue Duoneb 3 mL every 6 hours as needed for shortness of breath or  wheezing -Continue Proair inhaler 2 puffs every 6 hours as needed for shortness of breath or wheezing -Continue Flonase 1-2 sprays each nostril daily -Continue saline nasal spray 3-4 times a day  -Levaquin 500 mg once daily for 10 days. Notify immediately of any rash, itching, hives, or swelling, or seek emergency care. Finish your antibiotics in their entirety. Do not stop just because symptoms improve. Take with food to reduce GI upset. Notify immediately of any joint or tendon pain.  -Daily probiotic over the counter -Mucinex (guaifenesin) 1200 mg Twice daily  -Hypertonic saline nebs 3 mL Twice daily  -Flutter valve every hour while awake  Activity as tolerated.  Notify if worsening breathlessness, cough, mucus production, fatigue, or wheezing occurs.  Maintain up to date vaccinations, including influenza, COVID, and pneumococcal.  Wash your hands often and avoid sick exposures.  Encouraged masking in crowds.   High resolution CT chest schedule for 2/13-2/17 for follow up.   Follow up with Dr. Sabra Heck, your PCP, to schedule an ultrasound of your left kidney.  Follow up with Dr. Elsworth Soho after HRCT in February. If symptoms do not improve or worsen, please contact office for sooner follow up or seek emergency care.    COPD (chronic obstructive pulmonary disease) ([place]) Experiences more relief with Advair vs Breztri. Continue advair Twice daily. See above plan.  Chronic respiratory failure (HCC) Continue supplemental O2 therapy 3lpm. SpO2 goal >88-90%. See above plan.  Allergic rhinitis Continue current regimen with Allegra, flonase, and saline nasal spray. See above.   Paroxysmal atrial fibrillation (HCC) Currently rate controlled. Follow up with cardiology as scheduled.   Abnormal CT of the chest Complex hypodensity in upper pole of left kidney 1.3 cm. Follow up with PCP to schedule renal ultrasound for further evaluation.      Clayton Bibles, NP 12/28/2020  Pt aware and  understands NP's role.

## 2020-12-28 NOTE — Assessment & Plan Note (Signed)
Experiences more relief with Advair vs Breztri. Continue advair Twice daily. See above plan.

## 2020-12-28 NOTE — Progress Notes (Signed)
Needs antibiotic and airway clearance measures including Mucinex, flutter valve.   Repeat CT chest in 8 to 12 weeks and follow-up with Dr. Elsworth Soho

## 2020-12-28 NOTE — Assessment & Plan Note (Signed)
Continue supplemental O2 therapy 3lpm. SpO2 goal >88-90%. See above plan.

## 2020-12-28 NOTE — Assessment & Plan Note (Signed)
Continue current regimen with Allegra, flonase, and saline nasal spray. See above.

## 2020-12-28 NOTE — Telephone Encounter (Signed)
Spoke with [name] as she also placed a phone call today. I scheduled the pt to see [place] at 3 pm today.

## 2020-12-28 NOTE — Patient Instructions (Addendum)
-  Continue Advair 230 2 puffs Twice daily, brush tongue and rinse mouth afterwards -Continue Allegra daily  -Continue Duoneb 3 mL every 6 hours as needed for shortness of breath or wheezing -Continue Proair inhaler 2 puffs every 6 hours as needed for shortness of breath or wheezing -Continue Flonase 1-2 sprays each nostril daily -Continue saline nasal spray 3-4 times a day -Continue supplemental O2 therapy at 3 lpm. Notify of increasing requirements. SpO2 goal >88-90%  -Levaquin 500 mg once daily for 10 days. Notify immediately of any rash, itching, hives, or swelling, or seek emergency care. Finish your antibiotics in their entirety. Do not stop just because symptoms improve. Take with food to reduce GI upset. Notify immediately of any joint or tendon pain.  -Daily probiotic over the counter -Mucinex (guaifenesin) 1200 mg Twice daily  -Hypertonic saline nebs 3 mL Twice daily  -Flutter valve every hour while awake  Activity as tolerated.  Notify if worsening breathlessness, cough, mucus production, fatigue, or wheezing occurs.  Maintain up to date vaccinations, including influenza, COVID, and pneumococcal.  Wash your hands often and avoid sick exposures.  Encouraged masking in crowds.   High resolution CT chest schedule for 2/13-2/17 for follow up.   Follow up with Dr. Sabra Heck, your PCP, to schedule an ultrasound of your left kidney.  Follow up with Dr. Elsworth Soho after HRCT in February. If symptoms do not improve or worsen, please contact office for sooner follow up or seek emergency care.

## 2020-12-28 NOTE — Assessment & Plan Note (Addendum)
High symptom burden. Likely related to mucoid impaction. Possible cause of leukocytosis - will need repeat CBC at next visit. Levaquin 500mg  for 10 days. Mucolytic agent - mucinex 1200 mg Twice daily, flutter valve, hypertonic nebs Twice daily. HRCT in 8-12 weeks.   Patient Instructions  -Continue Advair 230 2 puffs Twice daily, brush tongue and rinse mouth afterwards -Continue Allegra daily  -Continue Duoneb 3 mL every 6 hours as needed for shortness of breath or wheezing -Continue Proair inhaler 2 puffs every 6 hours as needed for shortness of breath or wheezing -Continue Flonase 1-2 sprays each nostril daily -Continue saline nasal spray 3-4 times a day  -Levaquin 500 mg once daily for 10 days. Notify immediately of any rash, itching, hives, or swelling, or seek emergency care. Finish your antibiotics in their entirety. Do not stop just because symptoms improve. Take with food to reduce GI upset. Notify immediately of any joint or tendon pain.  -Daily probiotic over the counter -Mucinex (guaifenesin) 1200 mg Twice daily  -Hypertonic saline nebs 3 mL Twice daily  -Flutter valve every hour while awake  Activity as tolerated.  Notify if worsening breathlessness, cough, mucus production, fatigue, or wheezing occurs.  Maintain up to date vaccinations, including influenza, COVID, and pneumococcal.  Wash your hands often and avoid sick exposures.  Encouraged masking in crowds.   High resolution CT chest schedule for 2/13-2/17 for follow up.   Follow up with Dr. Sabra Heck, your PCP, to schedule an ultrasound of your left kidney.  Follow up with Dr. Elsworth Soho after HRCT in February. If symptoms do not improve or worsen, please contact office for sooner follow up or seek emergency care.

## 2020-12-28 NOTE — Assessment & Plan Note (Signed)
Complex hypodensity in upper pole of left kidney 1.3 cm. Follow up with PCP to schedule renal ultrasound for further evaluation.

## 2021-01-09 ENCOUNTER — Other Ambulatory Visit: Payer: Self-pay | Admitting: Family Medicine

## 2021-01-09 DIAGNOSIS — N2889 Other specified disorders of kidney and ureter: Secondary | ICD-10-CM

## 2021-01-10 ENCOUNTER — Other Ambulatory Visit: Payer: Self-pay | Admitting: Neurology

## 2021-01-10 DIAGNOSIS — G40409 Other generalized epilepsy and epileptic syndromes, not intractable, without status epilepticus: Secondary | ICD-10-CM

## 2021-01-10 DIAGNOSIS — F09 Unspecified mental disorder due to known physiological condition: Secondary | ICD-10-CM

## 2021-01-10 DIAGNOSIS — I609 Nontraumatic subarachnoid hemorrhage, unspecified: Secondary | ICD-10-CM

## 2021-01-15 ENCOUNTER — Ambulatory Visit: Payer: Medicare Other | Admitting: Pulmonary Disease

## 2021-01-15 NOTE — Telephone Encounter (Signed)
Received refill request for clonazepam.  Last OV was on 09/06/20.  Next OV is scheduled for 03/14/21 .  Last RX was written on 09/14/20 for 135 tabs.   [place] [facility] Database has been reviewed.

## 2021-01-16 ENCOUNTER — Encounter: Payer: Self-pay | Admitting: Interventional Cardiology

## 2021-01-16 NOTE — Telephone Encounter (Signed)
I spoke with [facility] and medication is available.  No changes needed.  It is scheduled to be shipped on 01/17/21

## 2021-01-17 ENCOUNTER — Ambulatory Visit (INDEPENDENT_AMBULATORY_CARE_PROVIDER_SITE_OTHER): Payer: Medicare Other | Admitting: Interventional Cardiology

## 2021-01-17 ENCOUNTER — Emergency Department (HOSPITAL_COMMUNITY): Payer: Medicare Other

## 2021-01-17 ENCOUNTER — Emergency Department (HOSPITAL_COMMUNITY)
Admission: EM | Admit: 2021-01-17 | Discharge: 2021-01-18 | Disposition: A | Discharge status: Home / Self Care | Payer: Medicare Other | Attending: Emergency Medicine | Admitting: Emergency Medicine

## 2021-01-17 ENCOUNTER — Encounter: Payer: Self-pay | Admitting: Interventional Cardiology

## 2021-01-17 ENCOUNTER — Encounter (HOSPITAL_COMMUNITY): Payer: Self-pay

## 2021-01-17 ENCOUNTER — Other Ambulatory Visit: Payer: Self-pay

## 2021-01-17 VITALS — BP 120/72 | HR 105 | Ht 64.0 in | Wt 222.0 lb

## 2021-01-17 DIAGNOSIS — Z9889 Other specified postprocedural states: Secondary | ICD-10-CM | POA: Diagnosis not present

## 2021-01-17 DIAGNOSIS — R531 Weakness: Secondary | ICD-10-CM | POA: Diagnosis not present

## 2021-01-17 DIAGNOSIS — Z5321 Procedure and treatment not carried out due to patient leaving prior to being seen by health care provider: Secondary | ICD-10-CM | POA: Diagnosis not present

## 2021-01-17 DIAGNOSIS — I10 Essential (primary) hypertension: Secondary | ICD-10-CM | POA: Diagnosis not present

## 2021-01-17 DIAGNOSIS — M4602 Spinal enthesopathy, cervical region: Secondary | ICD-10-CM | POA: Diagnosis not present

## 2021-01-17 DIAGNOSIS — M25552 Pain in left hip: Secondary | ICD-10-CM | POA: Diagnosis not present

## 2021-01-17 DIAGNOSIS — W19XXXA Unspecified fall, initial encounter: Secondary | ICD-10-CM | POA: Insufficient documentation

## 2021-01-17 DIAGNOSIS — M25511 Pain in right shoulder: Secondary | ICD-10-CM | POA: Diagnosis not present

## 2021-01-17 DIAGNOSIS — M25551 Pain in right hip: Secondary | ICD-10-CM | POA: Diagnosis not present

## 2021-01-17 DIAGNOSIS — I739 Peripheral vascular disease, unspecified: Secondary | ICD-10-CM | POA: Diagnosis not present

## 2021-01-17 DIAGNOSIS — Z7901 Long term (current) use of anticoagulants: Secondary | ICD-10-CM | POA: Diagnosis not present

## 2021-01-17 DIAGNOSIS — J439 Emphysema, unspecified: Secondary | ICD-10-CM | POA: Diagnosis not present

## 2021-01-17 DIAGNOSIS — E782 Mixed hyperlipidemia: Secondary | ICD-10-CM

## 2021-01-17 DIAGNOSIS — R194 Change in bowel habit: Secondary | ICD-10-CM | POA: Diagnosis not present

## 2021-01-17 DIAGNOSIS — I959 Hypotension, unspecified: Secondary | ICD-10-CM | POA: Diagnosis not present

## 2021-01-17 DIAGNOSIS — I6522 Occlusion and stenosis of left carotid artery: Secondary | ICD-10-CM | POA: Diagnosis not present

## 2021-01-17 DIAGNOSIS — I4821 Permanent atrial fibrillation: Secondary | ICD-10-CM | POA: Diagnosis not present

## 2021-01-17 DIAGNOSIS — M47816 Spondylosis without myelopathy or radiculopathy, lumbar region: Secondary | ICD-10-CM | POA: Diagnosis not present

## 2021-01-17 DIAGNOSIS — Z743 Need for continuous supervision: Secondary | ICD-10-CM | POA: Diagnosis not present

## 2021-01-17 LAB — CBC WITH DIFFERENTIAL/PLATELET
Abs Immature Granulocytes: 0 10*3/uL (ref 0.00–0.07)
Basophils Absolute: 0.3 10*3/uL — ABNORMAL HIGH (ref 0.0–0.1)
Basophils Relative: 2 %
Eosinophils Absolute: 0 10*3/uL (ref 0.0–0.5)
Eosinophils Relative: 0 %
HCT: 44.4 % (ref 36.0–46.0)
Hemoglobin: 13.9 g/dL (ref 12.0–15.0)
Lymphocytes Relative: 11 %
Lymphs Abs: 1.7 10*3/uL (ref 0.7–4.0)
MCH: 31.7 pg (ref 26.0–34.0)
MCHC: 31.3 g/dL (ref 30.0–36.0)
MCV: 101.1 fL — ABNORMAL HIGH (ref 80.0–100.0)
Monocytes Absolute: 0.9 10*3/uL (ref 0.1–1.0)
Monocytes Relative: 6 %
Neutro Abs: 12.2 10*3/uL — ABNORMAL HIGH (ref 1.7–7.7)
Neutrophils Relative %: 81 %
Platelets: 276 10*3/uL (ref 150–400)
RBC: 4.39 MIL/uL (ref 3.87–5.11)
RDW: 14.8 % (ref 11.5–15.5)
WBC: 15.1 10*3/uL — ABNORMAL HIGH (ref 4.0–10.5)
nRBC: 0 % (ref 0.0–0.2)

## 2021-01-17 LAB — BASIC METABOLIC PANEL
Anion gap: 12 (ref 5–15)
BUN: 10 mg/dL (ref 8–23)
CO2: 29 mmol/L (ref 22–32)
Calcium: 9.3 mg/dL (ref 8.9–10.3)
Chloride: 98 mmol/L (ref 98–111)
Creatinine, Ser: 0.83 mg/dL (ref 0.44–1.00)
GFR, Estimated: >60 mL/min (ref >60)
Glucose, Bld: 121 mg/dL — ABNORMAL HIGH (ref 70–99)
Potassium: 3.5 mmol/L (ref 3.5–5.1)
Sodium: 139 mmol/L (ref 135–145)

## 2021-01-17 NOTE — Patient Instructions (Signed)

## 2021-01-17 NOTE — ED Provider Triage Note (Signed)
Emergency Medicine Provider Triage Evaluation Note  Beth [name] , a 81 y.o. female  was evaluated in triage.  Pt complains of a fall that occurred just prior to arrival.  Patient states that she has been generally weak in both of her legs today and an attempt to get out of the wheelchair she try to get up and just felt weak in the legs and could have slid down onto her butt.  She was unable to get up.  Fire department had to come and assist her to get up.  She then immediately had a bowel movement in the bathroom.  She did not hit her head.  She does have a history of atrial fibrillation and is on anticoagulation.  Review of Systems  Positive:  Negative: See above   Physical Exam  BP (!) 152/97 (BP Location: Left Arm)    Pulse 100    Temp (!) 97.3 F (36.3 C) (Oral)    Resp 18    LMP 01/14/1995 (Approximate)    SpO2 100%  Gen:   Awake, no distress   Resp:  Normal effort  MSK:   Moves extremities without difficulty  Other:  Knees are mildly tender but she has full range of motion.  Bilateral hips are stable with mild tender to palpation.  Medical Decision Making  Medically screening exam initiated at 5:58 PM.  Appropriate orders placed.  [name] was informed that the remainder of the evaluation will be completed by another provider, this initial triage assessment does not replace that evaluation, and the importance of remaining in the ED until their evaluation is complete.  We will get some basic labs and imaging of the pelvis given the mechanism of injury.  Shared decision-making was done with regards to x-ray of the knees.  Patient ultimately declined.   Myna Bright [place], [name] 01/17/21 1759

## 2021-01-17 NOTE — ED Triage Notes (Signed)
Pt BIB EMS from home. Pt had a fall from her wheelchair while trying to stand, pt fell to knees. Pt is on blood thinners, denies hitting her head, denies LOC. Pt c/o weakness causing her to not be able to stand. Pt is on 3L [place] O2 at baseline.

## 2021-01-17 NOTE — Progress Notes (Signed)
Cardiology Office Note   Date:  01/17/2021   ID:  Beth [name], Beth [name] May 18, 1940, MRN [id number]  PCP:  Kathyrn Lass, MD    Chief Complaint  Patient presents with   Follow-up     Wt Readings from Last 3 Encounters:  01/17/21 222 lb (100.7 kg)  12/28/20 226 lb (102.5 kg)  12/18/20 232 lb (105.2 kg)       History of Present Illness: SALIAH CRISP is a 81 y.o. female   Who I saw in  2009  After stroke. 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  Subsequent seizures. Has some word finding issues and cognitive deficits with emotional lability She takes Keppra and Klonopin and is unable to drive, according to prior records.   In 2017, she was seen by Dr. Johnsie Cancel for palpitations.  Echo at that time: "Left ventricle: The cavity size was normal. Wall thickness was   normal. Systolic function was vigorous. The estimated ejection   fraction was in the range of 65% to 70%. Wall motion was normal;   there were no regional wall motion abnormalities. Doppler   parameters are consistent with abnormal left ventricular   relaxation (grade 1 diastolic dysfunction). Doppler parameters   are consistent with high ventricular filling pressure. - Mitral valve: Calcified annulus.   Impressions:   - Vigorous LV systolic function; grade 1 diastolic dysfunction with   elevated LV filling pressure; trace MR and TR."    She is on chronic oxygen for COPD.   New onset AFib in the setting of TCAR in 6/21, for symptomatic right carotid stenosis. Managed by Dr. Scot Dock.  ECG from May 2021 shows A. fib with rapid ventricular response.   Eliquis was added and restarted on postop day 2 after her TCAR.  Clopidogrel  Was stopped.     She has had issues with her COPD.    She had syncope in late 2022.  Zio patch in 2022 showed: "Atrial fibrillation with overall, adequate rate control. Average HR 88 bpm. Rare PVCs that were associated with patient symptoms at times. No  significant pauses.     Patch Wear Time:  3 days and 0 hours (2022-11-20T16:00:30-0500 to 2022-11-23T16:00:52-0500)   Atrial Fibrillation occurred continuously (100% burden), ranging from 59-135 bpm (avg of 88 bpm). Isolated VEs were rare (<1.0%), and no VE Couplets or VE Triplets were present. "  She only wore it for 3 days after getting it wet in the shower.    Denies : exertional Chest pain. Dizziness. Leg edema. Nitroglycerin use. Orthopnea.  Paroxysmal nocturnal dyspnea. Shortness of breath. Syncope.    Occasional palpitations.  CT in 12/21/20:  "Patchy airspace disease at the lung bases with consolidation in the right lower lobe, possible atelectasis or infiltrate. 2. Scattered pulmonary nodules in the right lower lobe, the largest measuring 1.1 cm and new from the prior exam. Findings are concerning for neoplastic process. PET-CT and/or biopsy is recommended for further evaluation. 3. Coronary artery calcifications. 4. Aortic atherosclerosis. 5. Complex hypodensity in the upper pole the left kidney. Ultrasound is recommended for further evaluation."  She is concerned that she may need a biopsy.    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    Vestibulitis of ear     Past Surgical History:  Procedure Laterality Date   broken ankle Right    TONSILLECTOMY     TRANSCAROTID ARTERY REVASCULARIZATION  Right 06/20/2019   TRANSCAROTID ARTERY  REVASCULARIZATION  Right 06/20/2019   Procedure: RIGHT TRANSCAROTID ARTERY REVASCULARIZATION;  Surgeon: Angelia Mould, MD;  Location: [facility];  Service: Vascular;  Laterality: Right;     Current Outpatient Medications  Medication Sig Dispense Refill   acetaminophen (TYLENOL) 325 MG tablet Take 1-2 tablets (325-650 mg total) by mouth every 4 (four) hours as needed for mild pain.     Albuterol Sulfate, sensor, (PROAIR DIGIHALER) 108 (90 Base) MCG/ACT AEPB Inhale 1 puff into the lungs as needed.     aspirin EC 81 MG tablet Take 81 mg by mouth daily.     atorvastatin (LIPITOR) 20 MG tablet TAKE 1 TABLET DAILY 90 tablet 3   captopril (CAPOTEN) 50 MG tablet Take 50 mg by mouth 2 (two) times daily.     Cholecalciferol (VITAMIN D3) 2000 units capsule Take 2,000 Units by mouth daily.      clonazePAM (KLONOPIN) 2 MG tablet Take 1 tablet at bedtime (seizure and insomnia) may take additional 0.5 tablet if necessary for panic attack. Our goal is to try and slowly wean down to no more than 1 tab a day. 135 tablet 0   diltiazem (CARDIZEM CD) 360 MG 24 hr capsule Take 1 capsule (360 mg total) by mouth daily. 90 capsule 3   ELIQUIS 5 MG TABS tablet TAKE 1 TABLET TWICE A DAY 180 tablet 3   famotidine (PEPCID) 20 MG tablet Take 1 tablet (20 mg total) by mouth 2 (two) times daily. 60 tablet 0   fexofenadine (ALLEGRA) 180 MG tablet 1 tablet Swallow whole with water; do not take with fruit juices.     fluticasone (FLONASE) 50 MCG/ACT nasal spray Place 1 spray into the nose daily as needed for allergies. Spray twice daily as needed     fluticasone-salmeterol (ADVAIR HFA) 230-21 MCG/ACT inhaler Inhale 2 puffs into the lungs 2 (two) times daily. 36 g 5   gabapentin (NEURONTIN) 600 MG tablet TAKE ONE-HALF (1/2) TABLET AT BEDTIME 45 tablet 3   LEXAPRO 20 MG tablet Take 1 tablet (20 mg total) by mouth daily. Morning dosage 90 tablet 3   memantine (NAMENDA) 5 MG tablet Take 1 tablet (5 mg total) by mouth 2 (two)  times daily. 60 tablet 6   metFORMIN (GLUCOPHAGE) 500 MG tablet Take 500 mg by mouth daily.     metoprolol tartrate (LOPRESSOR) 25 MG tablet Take 25 mg by mouth as needed.     polyethylene glycol (MIRALAX / GLYCOLAX) 17 g packet Take 17 g by mouth daily. Purchase bottle over the counter and use capful daily 14 each 0   psyllium (METAMUCIL) 28 % packet 1 packet with 8 ounces of liquid as needed     sodium chloride HYPERTONIC 3 % nebulizer solution Take by nebulization in the morning and at bedtime. 75 mL 3   Spacer/Aero-Holding Chambers (AEROCHAMBER PLUS WITH  MASK) inhaler As needed with inhaler 1 each 0   vitamin B-12 (CYANOCOBALAMIN) 500 MCG tablet Take 1 tablet (500 mcg total) by mouth daily.     No current facility-administered medications for this visit.    Allergies:   Prozac [fluoxetine hcl], Sulfa antibiotics, Micardis [telmisartan], Prednisone, Augmentin [amoxicillin-pot clavulanate], Avelox [moxifloxacin hcl in nacl], Lisinopril-hydrochlorothiazide, and Norvasc [amlodipine besylate]    Social History:  The patient  reports that she quit smoking about 15 years ago. Her smoking use included cigarettes. She has a 41.00 pack-year smoking history. She has never used smokeless tobacco. She reports that she does not drink alcohol and does not use drugs.   Family History:  The patient's family history includes Bleeding Disorder in her maternal grandfather; Breast cancer (age of onset: 74) in her daughter; Colon cancer in her maternal aunt; Dementia in her maternal grandfather and mother; Emphysema in her maternal aunt, maternal aunt, and maternal uncle; High blood pressure in her maternal grandmother; Osteoporosis in her mother; Other in her daughter and daughter; Ovarian cancer (age of onset: 49) in her paternal grandmother; Ovarian cancer (age of onset: 76) in her paternal aunt; Stroke in her mother; Thyroid disease in her mother.    ROS:  Please see the history of present illness.    Otherwise, review of systems are positive for rash on ankles.   All other systems are reviewed and negative.    PHYSICAL EXAM: VS:  BP 120/72    Pulse (!) 105    Ht '5\' 4"'  (1.626 m)    Wt 222 lb (100.7 kg)    LMP 01/14/1995 (Approximate)    SpO2 96%    BMI 38.11 kg/m  , BMI Body mass index is 38.11 kg/m. GEN: Well nourished, well developed, in no acute distress, in wheelchair HEENT: normal Neck: no JVD, carotid bruits, or masses Cardiac: irregularly irregular; no murmurs, rubs, or gallops,no edema  Respiratory:  clear to auscultation bilaterally, normal work of breathing GI: soft, nontender, nondistended, + BS MS: no deformity or atrophy Skin: warm and dry, Some skin changes c/w venous insufficiency on both ankles Neuro:  Strength and sensation are intact, in wheelchair Psych: euthymic mood, full affect   EKG:   The ekg ordered today demonstrates AFib, rate controlled   Recent Labs: 12/06/2020: ALT 17; B Natriuretic Peptide 41.0 12/18/2020: BUN 12; Creatinine, Ser 0.68; Hemoglobin 13.0; Platelets 267.0; Potassium 5.1; Pro B Natriuretic peptide (BNP) 54.0; Sodium 137   Lipid Panel    Component Value Date/Time   CHOL 151 05/17/2019 0346   TRIG 172 (H) 05/17/2019 0346   HDL 39 (L) 05/17/2019 0346   CHOLHDL 3.9 05/17/2019 0346   VLDL 34 05/17/2019 0346   LDLCALC 78 05/17/2019 0346     Other studies Reviewed: Additional studies/ records that were reviewed today with results demonstrating: Zio monitor reviewed.   ASSESSMENT AND PLAN:  Atrial fibrillation: Eliquis for stroke prevention.  Rate controlled with Cardizem.  Monitor was reassuring.  No pauses that would have caused her syncope in late 2022. Hypertension: Low-salt diet.  Avoid processed foods. The current medical regimen is effective;  continue present plan and medications. Lower extremity edema: Elevate legs.   PAD/hyperlipidemia: Keep LDL at least below 100.  Continue statin therapy.  Whole food, plant-based  diet. Anticoagulated: Eliquis for stroke prevention.  Check CBC and be met regularly.  Prior subarachnoid hemorrhage.  Has been cleared for anticoagulation. If biopsy was needed, her Eliquis would need to be held for at least  a few days.  We can arrange to give her the specific instructions if an invasive procedure is necessary.   Current medicines are reviewed at length with the patient today.  The patient concerns regarding her medicines were addressed.  The following changes have been made:  No change  Labs/ tests ordered today include:  No orders of the defined types were placed in this encounter.   Recommend 150 minutes/week of aerobic exercise Low fat, low carb, high fiber diet recommended  Disposition:   FU in 1 year   Signed, Larae Grooms, MD  01/17/2021 2:07 PM    [facility] [address], [place], [place]  [address] Phone: [phone]; Fax: [phone]

## 2021-01-18 NOTE — ED Notes (Signed)
Pt told writer she was leaving due to not being able to see provider right now and that she needed to go home and take her meds

## 2021-01-30 ENCOUNTER — Other Ambulatory Visit: Payer: Medicare Other

## 2021-02-01 ENCOUNTER — Other Ambulatory Visit: Payer: Self-pay | Admitting: *Deleted

## 2021-02-01 MED ORDER — ADVAIR HFA 230-21 MCG/ACT IN AERO: 2.0000 | INHALATION_SPRAY | Freq: Two times a day (BID) | RESPIRATORY_TRACT | 3 refills | Status: DC

## 2021-02-08 ENCOUNTER — Ambulatory Visit
Admission: RE | Admit: 2021-02-08 | Discharge: 2021-02-08 | Disposition: A | Discharge status: Home / Self Care | Payer: Medicare Other | Source: Ambulatory Visit | Attending: Family Medicine | Admitting: Family Medicine

## 2021-02-08 DIAGNOSIS — K7689 Other specified diseases of liver: Secondary | ICD-10-CM | POA: Diagnosis not present

## 2021-02-08 DIAGNOSIS — N281 Cyst of kidney, acquired: Secondary | ICD-10-CM | POA: Diagnosis not present

## 2021-02-08 DIAGNOSIS — N2889 Other specified disorders of kidney and ureter: Secondary | ICD-10-CM | POA: Diagnosis not present

## 2021-02-11 DIAGNOSIS — R110 Nausea: Secondary | ICD-10-CM | POA: Diagnosis not present

## 2021-02-11 DIAGNOSIS — D72829 Elevated white blood cell count, unspecified: Secondary | ICD-10-CM | POA: Diagnosis not present

## 2021-02-11 DIAGNOSIS — R9389 Abnormal findings on diagnostic imaging of other specified body structures: Secondary | ICD-10-CM | POA: Diagnosis not present

## 2021-02-11 DIAGNOSIS — R634 Abnormal weight loss: Secondary | ICD-10-CM | POA: Diagnosis not present

## 2021-02-11 DIAGNOSIS — R42 Dizziness and giddiness: Secondary | ICD-10-CM | POA: Diagnosis not present

## 2021-02-12 ENCOUNTER — Other Ambulatory Visit (HOSPITAL_COMMUNITY): Payer: Self-pay | Admitting: Family Medicine

## 2021-02-12 DIAGNOSIS — R634 Abnormal weight loss: Secondary | ICD-10-CM

## 2021-02-13 ENCOUNTER — Other Ambulatory Visit: Payer: Self-pay | Admitting: Vascular Surgery

## 2021-02-18 ENCOUNTER — Ambulatory Visit (HOSPITAL_COMMUNITY)

## 2021-02-19 ENCOUNTER — Encounter (HOSPITAL_COMMUNITY): Payer: Self-pay

## 2021-02-19 ENCOUNTER — Other Ambulatory Visit: Payer: Self-pay

## 2021-02-19 ENCOUNTER — Ambulatory Visit (HOSPITAL_COMMUNITY): Payer: Medicare Other

## 2021-02-19 ENCOUNTER — Ambulatory Visit (HOSPITAL_COMMUNITY)
Admission: RE | Admit: 2021-02-19 | Discharge: 2021-02-19 | Disposition: A | Discharge status: Home / Self Care | Payer: Medicare Other | Source: Ambulatory Visit | Attending: Nurse Practitioner | Admitting: Nurse Practitioner

## 2021-02-19 DIAGNOSIS — J470 Bronchiectasis with acute lower respiratory infection: Secondary | ICD-10-CM | POA: Insufficient documentation

## 2021-02-19 DIAGNOSIS — R634 Abnormal weight loss: Secondary | ICD-10-CM | POA: Diagnosis not present

## 2021-02-19 DIAGNOSIS — J432 Centrilobular emphysema: Secondary | ICD-10-CM | POA: Diagnosis not present

## 2021-02-19 DIAGNOSIS — K5730 Diverticulosis of large intestine without perforation or abscess without bleeding: Secondary | ICD-10-CM | POA: Diagnosis not present

## 2021-02-19 DIAGNOSIS — N2889 Other specified disorders of kidney and ureter: Secondary | ICD-10-CM | POA: Diagnosis not present

## 2021-02-19 DIAGNOSIS — J8410 Pulmonary fibrosis, unspecified: Secondary | ICD-10-CM | POA: Diagnosis not present

## 2021-02-19 DIAGNOSIS — J449 Chronic obstructive pulmonary disease, unspecified: Secondary | ICD-10-CM | POA: Diagnosis not present

## 2021-02-19 LAB — POCT I-STAT CREATININE: Creatinine, Ser: 0.8 mg/dL (ref 0.44–1.00)

## 2021-02-19 MED ORDER — SODIUM CHLORIDE (PF) 0.9 % IJ SOLN
INTRAMUSCULAR | Status: AC
  Filled 2021-02-19: qty 50

## 2021-02-19 MED ORDER — IOHEXOL 300 MG/ML  SOLN
100.0000 mL | Freq: Once | INTRAMUSCULAR | Status: AC | PRN
  Administered 2021-02-19: 100 mL via INTRAVENOUS

## 2021-02-20 ENCOUNTER — Other Ambulatory Visit: Payer: Self-pay | Admitting: Nurse Practitioner

## 2021-02-20 DIAGNOSIS — R911 Solitary pulmonary nodule: Secondary | ICD-10-CM

## 2021-03-05 ENCOUNTER — Other Ambulatory Visit: Payer: Self-pay

## 2021-03-05 ENCOUNTER — Ambulatory Visit (HOSPITAL_COMMUNITY)
Admission: RE | Admit: 2021-03-05 | Discharge: 2021-03-05 | Disposition: A | Discharge status: Home / Self Care | Payer: Medicare Other | Source: Ambulatory Visit | Attending: Nurse Practitioner | Admitting: Nurse Practitioner

## 2021-03-05 DIAGNOSIS — R911 Solitary pulmonary nodule: Secondary | ICD-10-CM | POA: Insufficient documentation

## 2021-03-05 DIAGNOSIS — M47816 Spondylosis without myelopathy or radiculopathy, lumbar region: Secondary | ICD-10-CM | POA: Diagnosis not present

## 2021-03-05 DIAGNOSIS — I2510 Atherosclerotic heart disease of native coronary artery without angina pectoris: Secondary | ICD-10-CM | POA: Diagnosis not present

## 2021-03-05 DIAGNOSIS — K5790 Diverticulosis of intestine, part unspecified, without perforation or abscess without bleeding: Secondary | ICD-10-CM | POA: Diagnosis not present

## 2021-03-05 DIAGNOSIS — J439 Emphysema, unspecified: Secondary | ICD-10-CM | POA: Diagnosis not present

## 2021-03-05 LAB — GLUCOSE, CAPILLARY: Glucose-Capillary: 136 mg/dL — ABNORMAL HIGH (ref 70–99)

## 2021-03-05 MED ORDER — FLUDEOXYGLUCOSE F - 18 (FDG) INJECTION
11.2000 | Freq: Once | INTRAVENOUS | Status: AC
  Administered 2021-03-05: 10.94 via INTRAVENOUS

## 2021-03-06 ENCOUNTER — Encounter: Payer: Self-pay | Admitting: Pulmonary Disease

## 2021-03-06 ENCOUNTER — Ambulatory Visit (INDEPENDENT_AMBULATORY_CARE_PROVIDER_SITE_OTHER): Payer: Medicare Other | Admitting: Pulmonary Disease

## 2021-03-06 VITALS — HR 94 | Ht 64.0 in

## 2021-03-06 DIAGNOSIS — J470 Bronchiectasis with acute lower respiratory infection: Secondary | ICD-10-CM | POA: Diagnosis not present

## 2021-03-06 DIAGNOSIS — R911 Solitary pulmonary nodule: Secondary | ICD-10-CM

## 2021-03-06 DIAGNOSIS — J181 Lobar pneumonia, unspecified organism: Secondary | ICD-10-CM | POA: Diagnosis not present

## 2021-03-06 DIAGNOSIS — J9611 Chronic respiratory failure with hypoxia: Secondary | ICD-10-CM | POA: Diagnosis not present

## 2021-03-06 MED ORDER — LEVOFLOXACIN 500 MG PO TABS: 500.0000 mg | ORAL_TABLET | Freq: Every day | ORAL | 0 refills | Status: DC

## 2021-03-06 NOTE — Progress Notes (Signed)
° °  Subjective:    Patient ID: Beth [name], female    DOB: [birth year], 81 y.o.   MRN: [id number]  HPI  81 yo former smoker for FU of COPD & chronic respiratory failure   PMH :  HTN, organic brain syndrome, stroke, BRCA2 positive, Obesity. 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  Subsequent seizures. Has some word finding issues and cognitive deficits with emotional lability   To clarify she does not have a diagnosis of breast cancer, she only had BRCA positive after her daughter was diagnosed with breast cancer    She arrives in a wheelchair, on oxygen, accompanied by [name] Last few office visits were reviewed  12/2020 OV - changed from breztri to advair 12/28/20 levaquin  CT chest from 12/21/2020 showed patchy airspace disease at the right lower lobe and largest right lower lobe pulmonary nodule measuring 1.1 cm She was treated with Levaquin Follow-up HRCT was obtained on 02/20/2021 which showed that right lower lobe consolidation had resolved with minimal scarring, no ILD was noted, there was 1 enlarging 1.4 x 1.2 cm nodule.  CT abdomen/pelvis showed indeterminate liver lesion and MRI was recommended PET scan was obtained yesterday the results of which are awaited but to my review shows right lower lobe consolidation with small effusion  She denies fevers but reports coughing, and dyspnea on exertion. No weight loss or hemoptysis.  She reports 1 episode of epistaxis which resolved spontaneously  Significant tests/ events reviewed  12/2020 CT chest without contrast 12/9 - bronchiectasis at the bases likely related to mucoid impaction  12/2018 CT chest Scattered pulmonary nodules are seen in the right lower lobe, the largest measuring up to 1.1 cm in diameter.    CT angio chest 07/2015 >> no pulmonary embolism, no emphysema, calcified mediastinal LNs PFTs  09/2015>> severe airway obstruction with FEV1 40%, improves to 47% with albuterol, DLCO decreased to 45%  Review of Systems neg for any significant sore throat, dysphagia, itching, sneezing, nasal congestion or excess/ purulent secretions, fever, chills, sweats, unintended wt loss, pleuritic or exertional cp, hempoptysis, orthopnea pnd or change in chronic leg swelling. Also denies presyncope, palpitations, heartburn, abdominal pain, nausea, vomiting, diarrhea or change in bowel or urinary habits, dysuria,hematuria, rash, arthralgias, visual complaints, headache, numbness weakness or ataxia.     Objective:   Physical Exam  Gen. Pleasant, obese, in no distress, normal affect ENT - no pallor,icterus, no post nasal drip, class 2-3 airway Neck: No JVD, no thyromegaly, no carotid bruits Lungs: no use of accessory muscles, no dullness to percussion, decreased without rales or rhonchi  Cardiovascular: Rhythm regular, heart sounds  normal, no murmurs or gallops, no peripheral edema Abdomen: soft and non-tender, no hepatosplenomegaly, BS normal. Musculoskeletal: No deformities, no cyanosis or clubbing Neuro:  alert, non focal, no tremors        Assessment & Plan:   Based on results of PET scan would need further discussion about advanced directives with this patient

## 2021-03-06 NOTE — Assessment & Plan Note (Signed)
Await official PET scan report report the CT images to my review showed right lower lobe consolidation. This appeared to have resolved on the CT scan in February and is now recurrent. We will treat for pneumonia with Levaquin for 7 to 10 days. We will also provide her hypertonic saline nebs and a new nebulizer for airway clearance.  She will continue Mucinex on a daily basis Reassess with chest x-ray in 2 weeks

## 2021-03-06 NOTE — Assessment & Plan Note (Signed)
This was noted to be 1.1 cm in the right lower lobe on December 2022 CT scan and has enlarged to 1.4 x 1.2 cm and appears lobulated on CT scan in February. PET scan is pending but appears to be hypermetabolic on the somewhat obscured by overlying consolidation. Unfortunately, she is not a candidate for biopsy or surgical resection.  I had this frank discussion with her and she completely understands this.  In fact she would not want any procedures done.  She wonders if she is even a candidate for radiation.  I think she could tolerate SBRT.  We will await full report of the PET scan and decide. To clarify she does not have a diagnosis of breast cancer, she only had BRCA positive after her daughter was diagnosed with breast cancer

## 2021-03-06 NOTE — Assessment & Plan Note (Signed)
Continue oxygen to maintain saturation above 90%. She is clearly at high risk for deterioration

## 2021-03-06 NOTE — Patient Instructions (Signed)
°  X Rx for levaquin 500 mg daily x 7 days  X Rx for new nebuliser to [facility] X Saline nebs twice daily x 30 x 1 refill  X CXR in 10 days to follow up on pneumonia

## 2021-03-08 ENCOUNTER — Telehealth: Payer: Self-pay | Admitting: Pulmonary Disease

## 2021-03-08 ENCOUNTER — Telehealth: Payer: Self-pay | Admitting: Internal Medicine

## 2021-03-08 DIAGNOSIS — J470 Bronchiectasis with acute lower respiratory infection: Secondary | ICD-10-CM

## 2021-03-08 MED ORDER — SODIUM CHLORIDE 3 % IN NEBU: INHALATION_SOLUTION | Freq: Two times a day (BID) | RESPIRATORY_TRACT | 11 refills | Status: DC

## 2021-03-08 MED ORDER — SODIUM CHLORIDE 3 % IN NEBU: INHALATION_SOLUTION | Freq: Two times a day (BID) | RESPIRATORY_TRACT | 11 refills | Status: AC

## 2021-03-08 NOTE — Telephone Encounter (Signed)
Refilled 3% at [facility] @ [address]

## 2021-03-08 NOTE — Telephone Encounter (Signed)
Discussed PET scan results with [name] , patient's daughter.  Had obtained permission during office visit to do so.  Right lower lobe nodule, multilobulated is obscured by consolidation?  Obstructive Liver lesion did not light up, so likely benign 1.1 cm left lung nodule, hypermetabolic She has bilateral nodules, no distant primary site identified.  Unfortunately per her discussion, she is not a candidate for biopsy or invasive procedures or empiric radiation.  Would suggest follow-up CT chest in 3 months

## 2021-03-08 NOTE — Telephone Encounter (Signed)
Rx for pt's sodium chloride sol has been sent to pharmacy for pt. Called pt's daughter [name] to let her know this had been done. While speaking with [name], she said she received notification from pharmacy that the sodium chloride sol is currently out of stock.  [name] wants to know if there is a different sol that could be sent in for pt to use with her having pna. Message sent to Dr. Elsworth Soho.  Per Dr. Elsworth Soho, pt can try to see if she could find a different pharmacy that has the hypertonic 3% sol in stock. In the meantime, pt can take mucinex 600mg  bid. Stated this info to Bethany and she said that pt already takes mucinex. She stated that she would try to see if she could find a pharmacy that has the sol in stock and would then call office once she has this info. Nothing further needed.

## 2021-03-11 ENCOUNTER — Ambulatory Visit (INDEPENDENT_AMBULATORY_CARE_PROVIDER_SITE_OTHER): Payer: Medicare Other | Admitting: Podiatry

## 2021-03-11 ENCOUNTER — Other Ambulatory Visit: Payer: Self-pay

## 2021-03-11 DIAGNOSIS — B351 Tinea unguium: Secondary | ICD-10-CM

## 2021-03-13 NOTE — Telephone Encounter (Signed)
Please advise 

## 2021-03-14 ENCOUNTER — Ambulatory Visit: Payer: Medicare Other | Admitting: Family Medicine

## 2021-03-19 NOTE — Progress Notes (Signed)
° °  Subjective: 81 y.o. female presenting today as a new patient for evaluation of an injury to the patient's left hallux nail plate.  She says that about 3 months ago her toe was stepped on and the nail was lifted slightly off of the underlying nailbed.  She says there is some slight tenderness associated.  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    Vestibulitis of ear     Objective: Physical Exam General: The patient is alert and oriented x3 in no acute distress.  Dermatology: The left hallux nail plate appears to have lifted off of the underlying nailbed slightly with some thickening and discoloration.  Very mild tenderness to palpation.  Vascular: Palpable pedal pulses bilaterally. No edema or erythema noted. Capillary refill within normal limits.  Neurological: Epicritic and protective threshold grossly intact bilaterally.   Musculoskeletal Exam: No pedal deformity noted bilateral  Assessment: #1 Onychomycosis of toenail left hallux  Plan of Care:  #1 Patient was evaluated. #2  After evaluating the patient and the nail plate I do believe this should grow out uneventfully.  There is some possible residual fungal infection secondary to the trauma however we are going to simply observe for now #3 continue good routine foot care and routine debridement of the nails #4 return to clinic as  needed  Edrick Kins, DPM [facility]  Dr. Edrick Kins, DPM    [address], [place] [address]                Office [phone]  Fax [phone]

## 2021-03-20 ENCOUNTER — Ambulatory Visit (INDEPENDENT_AMBULATORY_CARE_PROVIDER_SITE_OTHER): Payer: Medicare Other

## 2021-03-20 ENCOUNTER — Other Ambulatory Visit: Payer: Self-pay

## 2021-03-20 ENCOUNTER — Ambulatory Visit (INDEPENDENT_AMBULATORY_CARE_PROVIDER_SITE_OTHER): Payer: Medicare Other | Admitting: Primary Care

## 2021-03-20 DIAGNOSIS — J301 Allergic rhinitis due to pollen: Secondary | ICD-10-CM

## 2021-03-20 DIAGNOSIS — J181 Lobar pneumonia, unspecified organism: Secondary | ICD-10-CM

## 2021-03-20 DIAGNOSIS — J189 Pneumonia, unspecified organism: Secondary | ICD-10-CM

## 2021-03-20 DIAGNOSIS — J479 Bronchiectasis, uncomplicated: Secondary | ICD-10-CM

## 2021-03-20 DIAGNOSIS — J90 Pleural effusion, not elsewhere classified: Secondary | ICD-10-CM | POA: Diagnosis not present

## 2021-03-20 MED ORDER — CLARITIN 5 MG PO CHEW: 5.0000 mg | CHEWABLE_TABLET | Freq: Every day | ORAL | 2 refills | Status: DC

## 2021-03-20 NOTE — Patient Instructions (Addendum)
CXR showed improvement in right lower lobe pneumonia and pleural effusion seen on CT imaging. Needs additional follow-up in 3-4 weeks to ensure resolution  ? ?Recommendations: ?Continue hypertonic saline nebs morning and at bedtime for airway clearance ?Continue Mucinex on a daily basis   ?You can try loratidine '5mg'$  daily which is less likely to cause drowsiness ?Continue fonase nasal spray  ?You can use AYR nasal gel  ?Depending on repeat CXR if area has not resolved may repeat CT chest  ? ?Orders: ?CXR in 4 weeks ? ?Follow-up: ?3 months with Dr. Elsworth Soho ? ? ? ?

## 2021-03-20 NOTE — Progress Notes (Unsigned)
'@Patient'  ID: Beth [name], female    DOB: 02-22-1940, 81 y.o.   MRN: [id number]  Chief Complaint  Patient presents with   Follow-up    Follow up from dr Beth [name]. Pt states that her breathing is doing good today. Pt is currently on 3L of cont O2    Referring provider: Kathyrn Lass, MD  HPI: 81 yo former smoker for FU of COPD & chronic respiratory failure   PMH :  HTN, organic brain syndrome, stroke, BRCA2 positive, Obesity. 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  Subsequent seizures. Has some word finding issues and cognitive deficits with emotional lability   To clarify she does not have a diagnosis of breast cancer, she only had BRCA positive after her daughter was diagnosed with breast cancer    She arrives in a wheelchair, on oxygen, accompanied by [name] Last few office visits were reviewed  12/2020 OV - changed from breztri to advair 12/28/20 levaquin  CT chest from 12/21/2020 showed patchy airspace disease at the right lower lobe and largest right lower lobe pulmonary nodule measuring 1.1 cm She was treated with Levaquin Follow-up HRCT was obtained on 02/20/2021 which showed that right lower lobe consolidation had resolved with minimal scarring, no ILD was noted, there was 1 enlarging 1.4 x 1.2 cm nodule.  CT abdomen/pelvis showed indeterminate liver lesion and MRI was recommended PET scan was obtained yesterday the results of which are awaited but to my review shows right lower lobe consolidation with small effusion  She denies fevers but reports coughing, and dyspnea on exertion. No weight loss or hemoptysis.  She reports 1 episode of epistaxis which resolved spontaneously   03/20/2021 Patient was seen on 03/06/21 for lobar pneumonia by Dr. Elsworth [name], treated with Levaquin 572m daily x 7 days.   She is feeling better with nebulizer, she is using this twice  a day. She is able to cough up a lot of mucus. At first phlegm was colored and this is now clearing. She is using advair as directed.   CXR showed improvement in right lower lobe pneumonia and pleural effusion seen on CT imaging. Needs additional follow-up in 3-4 weeks to ensure resolution   Allergies  Allergen Reactions   Prozac [Fluoxetine Hcl] Other (See Comments)    seizures   Sulfa Antibiotics Nausea And Vomiting and Other (See Comments)    TINGLING IN LEGS ALSO   Micardis [Telmisartan] Swelling and Other (See Comments)    Swelling, bruising Swelling, bruising   Prednisone Other (See Comments)    mood changes Mood changes   Augmentin [Amoxicillin-Pot Clavulanate] Rash   Avelox [Moxifloxacin Hcl In Nacl] Rash    LEVAQUIN IS OK-SEE NOTE   Lisinopril-Hydrochlorothiazide Other (See Comments)    unknown   Norvasc [Amlodipine Besylate] Other (See Comments)    fatigue    Immunization History  Administered Date(s) Administered   Influenza Split 01/03/2008, 11/06/2008   Influenza, High Dose Seasonal PF 11/22/2013, 11/27/2014, 10/28/2016, 10/27/2017, 09/14/2018, 10/24/2019   Influenza,inj,Quad PF,6+ Mos 10/19/2018   Influenza-Unspecified 10/08/2015, 11/05/2020   PFIZER(Purple Top)SARS-COV-2 Vaccination 02/02/2019, 02/23/2019, 11/26/2019   Pneumococcal Conjugate-13 11/22/2013   Pneumococcal Polysaccharide-23 05/05/2008   Td 05/22/2003   Tdap 08/20/2012   Zoster, Live 08/20/2012    Past Medical History:  Diagnosis Date   [place]    diagnosed 05/17/19   Arthritis    BRCA2  positive 2017   COPD (chronic obstructive pulmonary disease) (HCC)    Diabetes mellitus without complication (HCC)    Diverticulosis    Dysrhythmia    Afib   Hearing loss    HTN (hypertension)    Memory loss    Pre-diabetes    Seasonal depression (HCC)    Seizure disorder, complex partial, 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    Vestibulitis of ear     Tobacco History: Social History   Tobacco Use  Smoking Status Former   Packs/day: 1.00   Years: 41.00   Pack years: 41.00   Types: Cigarettes   Quit date: 01/13/2006   Years since quitting: 15.1  Smokeless Tobacco Never   Counseling given: Not Answered   Outpatient Medications Prior to Visit  Medication Sig Dispense Refill   acetaminophen (TYLENOL) 325 MG tablet Take 1-2 tablets (325-650 mg total) by mouth every 4 (four) hours as needed for mild pain.     Albuterol Sulfate, sensor, (PROAIR DIGIHALER) 108 (90 Base) MCG/ACT AEPB Inhale 1 puff into the lungs as needed.     aspirin EC 81 MG tablet Take 81 mg by mouth daily.     atorvastatin (LIPITOR) 20 MG tablet TAKE 1 TABLET DAILY 90 tablet 3   captopril (CAPOTEN) 50 MG tablet Take 50 mg by mouth 2 (two) times daily.     Cholecalciferol (VITAMIN D3) 2000 units capsule Take 2,000 Units by mouth daily.      clonazePAM (KLONOPIN) 2 MG tablet Take 1 tablet at bedtime (seizure and insomnia) may take additional 0.5 tablet if necessary for panic attack. Our goal is to try and slowly wean down to no more than 1 tab a day. 135 tablet 0   diltiazem (CARDIZEM CD) 360 MG 24 hr capsule Take 1 capsule (360 mg total) by mouth daily. 90 capsule 3   ELIQUIS 5 MG TABS tablet TAKE 1 TABLET TWICE A DAY 180 tablet 3   famotidine (PEPCID) 20 MG tablet Take 1 tablet (20 mg total) by mouth 2 (two) times daily. 60 tablet 0   fluticasone (FLONASE) 50 MCG/ACT nasal spray Place 1 spray into the nose daily as needed for allergies. Spray twice daily as needed     fluticasone-salmeterol (ADVAIR HFA) 230-21 MCG/ACT inhaler Inhale 2 puffs into the lungs 2 (two) times daily. 36 g 3   gabapentin (NEURONTIN) 600 MG tablet TAKE ONE-HALF (1/2) TABLET AT BEDTIME 45 tablet 3   levofloxacin (LEVAQUIN) 500 MG tablet Take 1 tablet (500 mg total) by mouth daily. 7  tablet 0   LEXAPRO 20 MG tablet Take 1 tablet (20 mg total) by mouth daily. Morning dosage 90 tablet 3   memantine (NAMENDA) 5 MG tablet Take 1 tablet (5 mg total) by mouth 2 (two) times daily. 60 tablet 6   metFORMIN (GLUCOPHAGE) 500 MG tablet Take 500 mg by mouth daily.     metoprolol tartrate (LOPRESSOR) 25 MG tablet Take 25 mg by mouth as needed.     polyethylene glycol (MIRALAX / GLYCOLAX) 17 g packet Take 17 g by mouth daily. Purchase bottle over the counter and use capful daily 14 each 0   psyllium (METAMUCIL) 28 % packet 1 packet with 8 ounces of liquid as needed     sodium chloride HYPERTONIC 3 % nebulizer  solution Take by nebulization in the morning and at bedtime. 75 mL 11   Spacer/Aero-Holding Chambers (AEROCHAMBER PLUS WITH MASK) inhaler As needed with inhaler 1 each 0   vitamin B-12 (CYANOCOBALAMIN) 500 MCG tablet Take 1 tablet (500 mcg total) by mouth daily.     fexofenadine (ALLEGRA) 180 MG tablet 1 tablet Swallow whole with water; do not take with fruit juices. (Patient not taking: Reported on 03/06/2021)     No facility-administered medications prior to visit.    Review of Systems  Review of Systems  Constitutional: Negative.   HENT:  Positive for congestion.   Respiratory:  Positive for cough. Negative for wheezing.     Physical Exam  BP 130/76 (BP Location: Left Arm, Patient Position: Sitting, Cuff Size: Normal)    Pulse 88    Temp 97.8 F (36.6 C) (Oral)    Ht '5\' 4"'  (1.626 m)    Wt 199 lb 12.8 oz (90.6 kg)    LMP 01/14/1995 (Approximate)    BMI 34.30 kg/m  Physical Exam Constitutional:      Appearance: Normal appearance.  HENT:     Head: Normocephalic and atraumatic.     Mouth/Throat:     Mouth: Mucous membranes are moist.     Pharynx: Oropharynx is clear.  Cardiovascular:     Rate and Rhythm: Normal rate and regular rhythm.  Pulmonary:     Effort: Pulmonary effort is normal.     Breath sounds: Normal breath sounds. No wheezing, rhonchi or rales.   Musculoskeletal:        General: Normal range of motion.  Skin:    General: Skin is warm and dry.  Neurological:     General: No focal deficit present.     Mental Status: She is alert and oriented to person, place, and time. Mental status is at baseline.  Psychiatric:        Mood and Affect: Mood normal.        Behavior: Behavior normal.        Thought Content: Thought content normal.        Judgment: Judgment normal.     Lab Results:  CBC    Component Value Date/Time   WBC 15.1 (H) 01/17/2021 1827   RBC 4.39 01/17/2021 1827   HGB 13.9 01/17/2021 1827   HGB 13.5 08/02/2020 1221   HCT 44.4 01/17/2021 1827   HCT 41.4 08/02/2020 1221   PLT 276 01/17/2021 1827   PLT 317 08/02/2020 1221   MCV 101.1 (H) 01/17/2021 1827   MCV 96 08/02/2020 1221   MCH 31.7 01/17/2021 1827   MCHC 31.3 01/17/2021 1827   RDW 14.8 01/17/2021 1827   RDW 12.9 08/02/2020 1221   LYMPHSABS 1.7 01/17/2021 1827   MONOABS 0.9 01/17/2021 1827   EOSABS 0.0 01/17/2021 1827   BASOSABS 0.3 (H) 01/17/2021 1827    BMET    Component Value Date/Time   NA 139 01/17/2021 1827   NA 146 (H) 08/02/2020 1221   K 3.5 01/17/2021 1827   CL 98 01/17/2021 1827   CO2 29 01/17/2021 1827   GLUCOSE 121 (H) 01/17/2021 1827   BUN 10 01/17/2021 1827   BUN 13 08/02/2020 1221   CREATININE 0.80 02/19/2021 1511   CREATININE 0.85 02/22/2018 1051   CALCIUM 9.3 01/17/2021 1827   GFRNONAA >60 01/17/2021 1827   GFRNONAA >60 02/22/2018 1051   GFRAA >60 06/20/2019 0657   GFRAA >60 02/22/2018 1051    BNP    Component Value Date/Time  BNP 41.0 12/06/2020 1526    ProBNP    Component Value Date/Time   PROBNP 54.0 12/18/2020 1626    Imaging: DG Chest 2 View  Result Date: 03/20/2021 CLINICAL DATA:  Follow up pneumonia. EXAM: CHEST - 2 VIEW COMPARISON:  PET-CT 03/05/2021, chest radiographs 02/19/2021 and radiographs 12/06/2020. FINDINGS: The heart size and mediastinal contours are stable with aortic atherosclerosis. There  are probable small right-greater-than-left pleural effusions with associated dependent airspace opacities at both lung bases. Compared with the PET-CT, the right basilar aeration appears mildly improved. No progressive airspace disease identified. There is a calcified left upper lobe granuloma. Mild degenerative changes are present in the spine. IMPRESSION: Right lower lobe airspace disease and right pleural effusion seen on recent CT appear slightly improved over the interval, although additional radiographic follow-up in 3-4 weeks is recommended to ensure resolution. Electronically Signed   By: Richardean Sale M.D.   On: 03/20/2021 14:59   CT ABDOMEN PELVIS W CONTRAST  Result Date: 02/20/2021 CLINICAL DATA:  81 year old female with history of breast cancer. 50 pound weight loss over the past 3 months. EXAM: CT ABDOMEN AND PELVIS WITH CONTRAST TECHNIQUE: Multidetector CT imaging of the abdomen and pelvis was performed using the standard protocol following bolus administration of intravenous contrast. RADIATION DOSE REDUCTION: This exam was performed according to the departmental dose-optimization program which includes automated exposure control, adjustment of the mA and/or kV according to patient size and/or use of iterative reconstruction technique. CONTRAST:  18m OMNIPAQUE IOHEXOL 300 MG/ML  SOLN COMPARISON:  CT the abdomen and pelvis 08/18/2014. FINDINGS: Lower chest: Please see separate dictation for contemporaneously obtained chest CT 02/19/2021 for full description of findings above the diaphragm. Hepatobiliary: In the central aspect of segments 4A and 4B (axial image 31 of series 2 and coronal image 35 of series 5) there is a 2.7 x 2.1 x 4.1 cm hypovascular or low-attenuation area which is new compared to the prior study from 08/18/2014. Notably, several vessels appear to directly traverse through this region without distortion. No other suspicious appearing hepatic lesions. No intra or extrahepatic  biliary ductal dilatation. Gallbladder is normal in appearance. Pancreas: No pancreatic mass. No pancreatic ductal dilatation. No pancreatic or peripancreatic fluid collections or inflammatory changes. Spleen: Unremarkable. Adrenals/Urinary Tract: Exophytic 5.4 cm low-attenuation lesion in the lateral aspect of the interpolar region of the left kidney, compatible with a simple cyst. Subcentimeter low-attenuation lesion in the anterior aspect of the upper pole of the right kidney, incompletely characterize, but statistically likely to represent a tiny cyst. Bilateral adrenal glands are normal in appearance. No hydroureteronephrosis. Urinary bladder is normal in appearance. Stomach/Bowel: The appearance of the stomach is normal. There is no pathologic dilatation of small bowel or colon. Numerous colonic diverticulae are noted, without surrounding inflammatory changes to suggest an acute diverticulitis at this time. The appendix is not confidently identified and may be surgically absent. Regardless, there are no inflammatory changes noted adjacent to the cecum to suggest the presence of an acute appendicitis at this time. Vascular/Lymphatic: Aortic atherosclerosis, without evidence of aneurysm or dissection in the abdominal or pelvic vasculature. No lymphadenopathy noted in the abdomen or pelvis. Reproductive: Uterus and ovaries are unremarkable in appearance. Other: No significant volume of ascites.  No pneumoperitoneum. Musculoskeletal: There are no aggressive appearing lytic or blastic lesions noted in the visualized portions of the skeleton. IMPRESSION: 1. Indeterminate lesion in the central aspect of segments 4A/4B of the liver. This is new compared to the prior study 08/18/2014, however,  the overall appearance is favored to represent focal fatty infiltration. Metastatic disease to the liver is not favored, but not strictly excluded, and further characterization with nonemergent abdominal MRI with and without IV  gadolinium is strongly recommended in the near future to provide definitive characterization. 2. No other signs of potential metastatic disease are noted elsewhere in the abdomen or pelvis. 3. Colonic diverticulosis without evidence of acute diverticulitis at this time. 4. Aortic atherosclerosis. 5. Additional incidental findings, as above. Electronically Signed   By: Vinnie Langton M.D.   On: 02/20/2021 10:55   CT CHEST HIGH RESOLUTION  Result Date: 02/20/2021 CLINICAL DATA:  81 year old female with history of pulmonary nodule. COPD. Shortness of breath. Weight loss of 50 pounds over the past 3 months. EXAM: CT CHEST WITHOUT CONTRAST TECHNIQUE: Multidetector CT imaging of the chest was performed following the standard protocol without intravenous contrast. High resolution imaging of the lungs, as well as inspiratory and expiratory imaging, was performed. RADIATION DOSE REDUCTION: This exam was performed according to the departmental dose-optimization program which includes automated exposure control, adjustment of the mA and/or kV according to patient size and/or use of iterative reconstruction technique. COMPARISON:  Chest CT 12/21/2020. FINDINGS: Cardiovascular: Heart size is normal. There is no significant pericardial fluid, thickening or pericardial calcification. There is aortic atherosclerosis, as well as atherosclerosis of the great vessels of the mediastinum and the coronary arteries, including calcified atherosclerotic plaque in the left main, left anterior descending, left circumflex and right coronary arteries. Calcifications of the mitral annulus. Dilatation of the pulmonic trunk (3.9 cm in diameter), concerning for pulmonary arterial hypertension. Mediastinum/Nodes: No pathologically enlarged mediastinal or hilar lymph nodes. Please note that accurate exclusion of hilar adenopathy is limited on noncontrast CT scans. A few densely calcified mediastinal lymph nodes are incidentally noted. Esophagus  is unremarkable in appearance. No axillary lymphadenopathy. Lungs/Pleura: Although most of the previously noted clustered right lower lobe pulmonary nodules have completely resolved or significantly regressed, some nodules are stable (example in the right lower lobe on axial image 104 of series 10), while the largest of the nodules has increased slightly in size. Specifically, axial image 80 of series 10 demonstrates an enlarging 1.4 x 1.2 cm macrolobulated nodule which previously measured only 1.1 cm. Pulmonary nodules are also evident in the central left lower lobe, largest of which measures 1.1 x 0.9 cm (axial image 83 of series 10), previously only 1.0 x 0.9 cm. Calcified granulomas are also noted in the lungs bilaterally. No acute consolidative airspace disease. No pleural effusions. Areas of architectural distortion and volume loss are again noted in the lung bases bilaterally, most compatible with areas of chronic post infectious or inflammatory scarring. High-resolution images otherwise demonstrate no compelling regions of ground-glass attenuation, septal thickening, subpleural reticulation, traction bronchiectasis or honeycombing to suggest interstitial lung disease. Inspiratory and expiratory imaging is unremarkable. Mild diffuse bronchial wall thickening with mild centrilobular and paraseptal emphysema. Upper Abdomen: Please see separate dictation for contemporaneously obtained CT abdomen and pelvis with contrast dated 02/19/2021 for full description of findings beneath the diaphragm. Musculoskeletal: There are no aggressive appearing lytic or blastic lesions noted in the visualized portions of the skeleton. IMPRESSION: 1. Although many of the smaller clustered peribronchovascular pulmonary nodules in the right lower lobe have resolved compared to the prior study, indicative of a benign infectious or inflammatory etiology, there are several persistent and enlarging pulmonary nodules in the lower lobes.  Given the patient's history of primary breast cancer, findings are concerning for  metastatic disease. Further evaluation with PET-CT should be considered. 2. There is evidence of what appears to be chronic post infectious or inflammatory scarring throughout the lung bases bilaterally, as above. No definitive findings to suggest interstitial lung disease. 3. Dilatation of the pulmonic trunk (3.9 cm in diameter), concerning for pulmonary arterial hypertension. 4. Aortic atherosclerosis, in addition to left main and three-vessel coronary artery disease. Assessment for potential risk factor modification, dietary therapy or pharmacologic therapy may be warranted, if clinically indicated. 5. There are calcifications of the mitral annulus. Echocardiographic correlation for evaluation of potential valvular dysfunction may be warranted if clinically indicated. 6. Mild diffuse bronchial wall thickening with mild centrilobular and paraseptal emphysema; imaging findings compatible with reported clinical history of COPD. Aortic Atherosclerosis (ICD10-I70.0) and Emphysema (ICD10-J43.9). Electronically Signed   By: Vinnie Langton M.D.   On: 02/20/2021 10:59   NM PET Image Initial (PI) Skull Base To Thigh (F-18 FDG)  Result Date: 03/08/2021 CLINICAL DATA:  Initial treatment strategy for pulmonary nodule. EXAM: NUCLEAR MEDICINE PET SKULL BASE TO THIGH TECHNIQUE: 10.9 mCi F-18 FDG was injected intravenously. Full-ring PET imaging was performed from the skull base to thigh after the radiotracer. CT data was obtained and used for attenuation correction and anatomic localization. Fasting blood glucose: 136 mg/dl COMPARISON:  Chest CT 02/19/2021 FINDINGS: Mediastinal blood pool activity: SUV max 3.7 Liver activity: SUV max NA NECK: No hypermetabolic lymph nodes in the neck. Incidental CT findings: Old right hemispheric infarct noted in the brain. CHEST: 9 mm short axis right paratracheal node on 64/3 is hypermetabolic with SUV max  = 4.1. 8 mm short axis AP window lymph node on 66/4 demonstrates SUV max = 4.0 9 mm short axis subcarinal node on 53/9 is hypermetabolic with SUV max = 5.8. Interval development of right lower lobe consolidative opacity with intense hypermetabolism in the infrahilar region SUV max = 9.0. No discrete mass lesion evident on noncontrast CT imaging. Right lower lobe pulmonary nodule seen previously is incorporated in the consolidative opacity. 1.1 cm central left lower lobe nodule seen on previous chest CT is visible on image 82/4 today with SUV max = 4.9. Incidental CT findings: Coronary artery calcification is evident. Moderate atherosclerotic calcification is noted in the wall of the thoracic aorta. Centrilobular emphsyema noted. Calcified granulomata noted bilaterally. ABDOMEN/PELVIS: No abnormal hypermetabolic activity within the liver, pancreas, adrenal glands, or spleen. No hypermetabolic lymph nodes in the abdomen or pelvis. Incidental CT findings: Exophytic cyst noted interpolar left kidney with additional tiny renal lesions bilaterally, too small to characterize. Insert diverticulosis left SKELETON: Heterogeneous FDG accumulation is identified in the bony anatomy, but no definite discrete hypermetabolic bone lesion is evident. Incidental CT findings: Degenerative changes noted in the lumbar spine. IMPRESSION: 1. Interval development of right lower lobe collapse/consolidation with intense hypermetabolism in the infrahilar region. Central obstructing neoplasm not excluded. 2. Associated small hypermetabolic mediastinal lymphadenopathy. 3. Left lower lobe nodule of concern on recent high-resolution chest CT is hypermetabolic, concerning for neoplasm. The right lower lobe pulmonary nodule seen on that study is incorporated into the collapse/consolidative opacity. 4.  Emphysema. (ICD10-J43.9) 5.  Aortic Atherosclerois (ICD10-170.0) Electronically Signed   By: Misty Stanley M.D.   On: 03/08/2021 09:43      Assessment & Plan:   No problem-specific Assessment & Plan notes found for this encounter.     Martyn Ehrich, NP 03/20/2021

## 2021-03-25 ENCOUNTER — Ambulatory Visit: Payer: Medicare Other | Admitting: Adult Health

## 2021-04-16 ENCOUNTER — Other Ambulatory Visit: Payer: Self-pay

## 2021-04-16 ENCOUNTER — Ambulatory Visit (INDEPENDENT_AMBULATORY_CARE_PROVIDER_SITE_OTHER): Payer: Medicare Other

## 2021-04-16 DIAGNOSIS — J181 Lobar pneumonia, unspecified organism: Secondary | ICD-10-CM

## 2021-04-16 DIAGNOSIS — J90 Pleural effusion, not elsewhere classified: Secondary | ICD-10-CM | POA: Diagnosis not present

## 2021-04-22 IMAGING — DX DG ANKLE 2V *L*
2 series · 2 of 2 positions shown · non-contrast
Comparison: None.

CLINICAL DATA: Left ankle pain and swelling

EXAM:
LEFT ANKLE - 2 VIEW

[ankle ap]
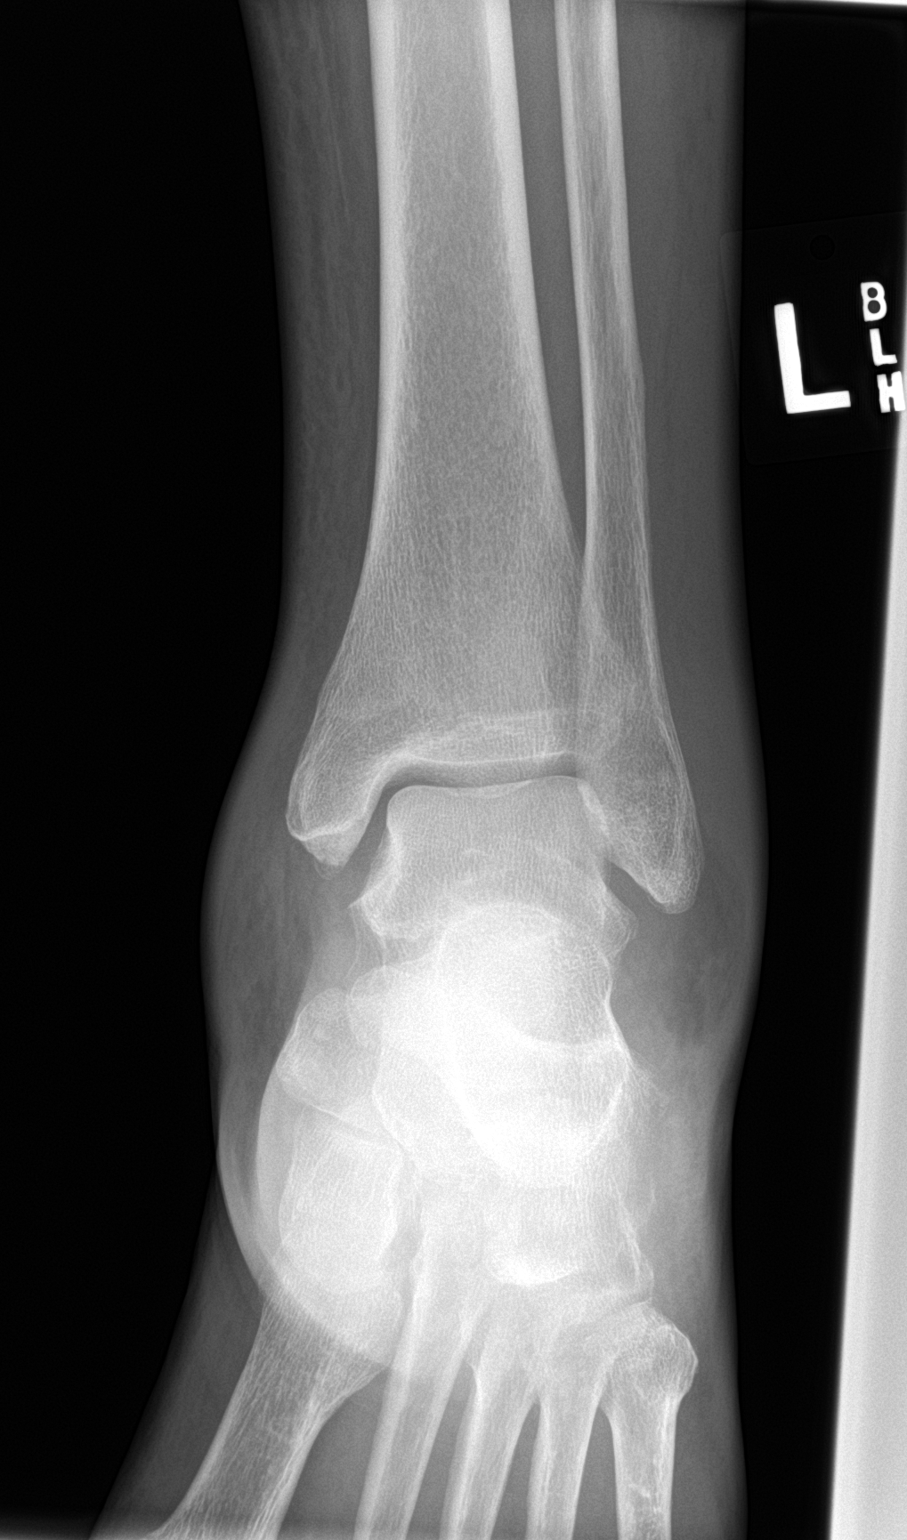

[ankle lat]
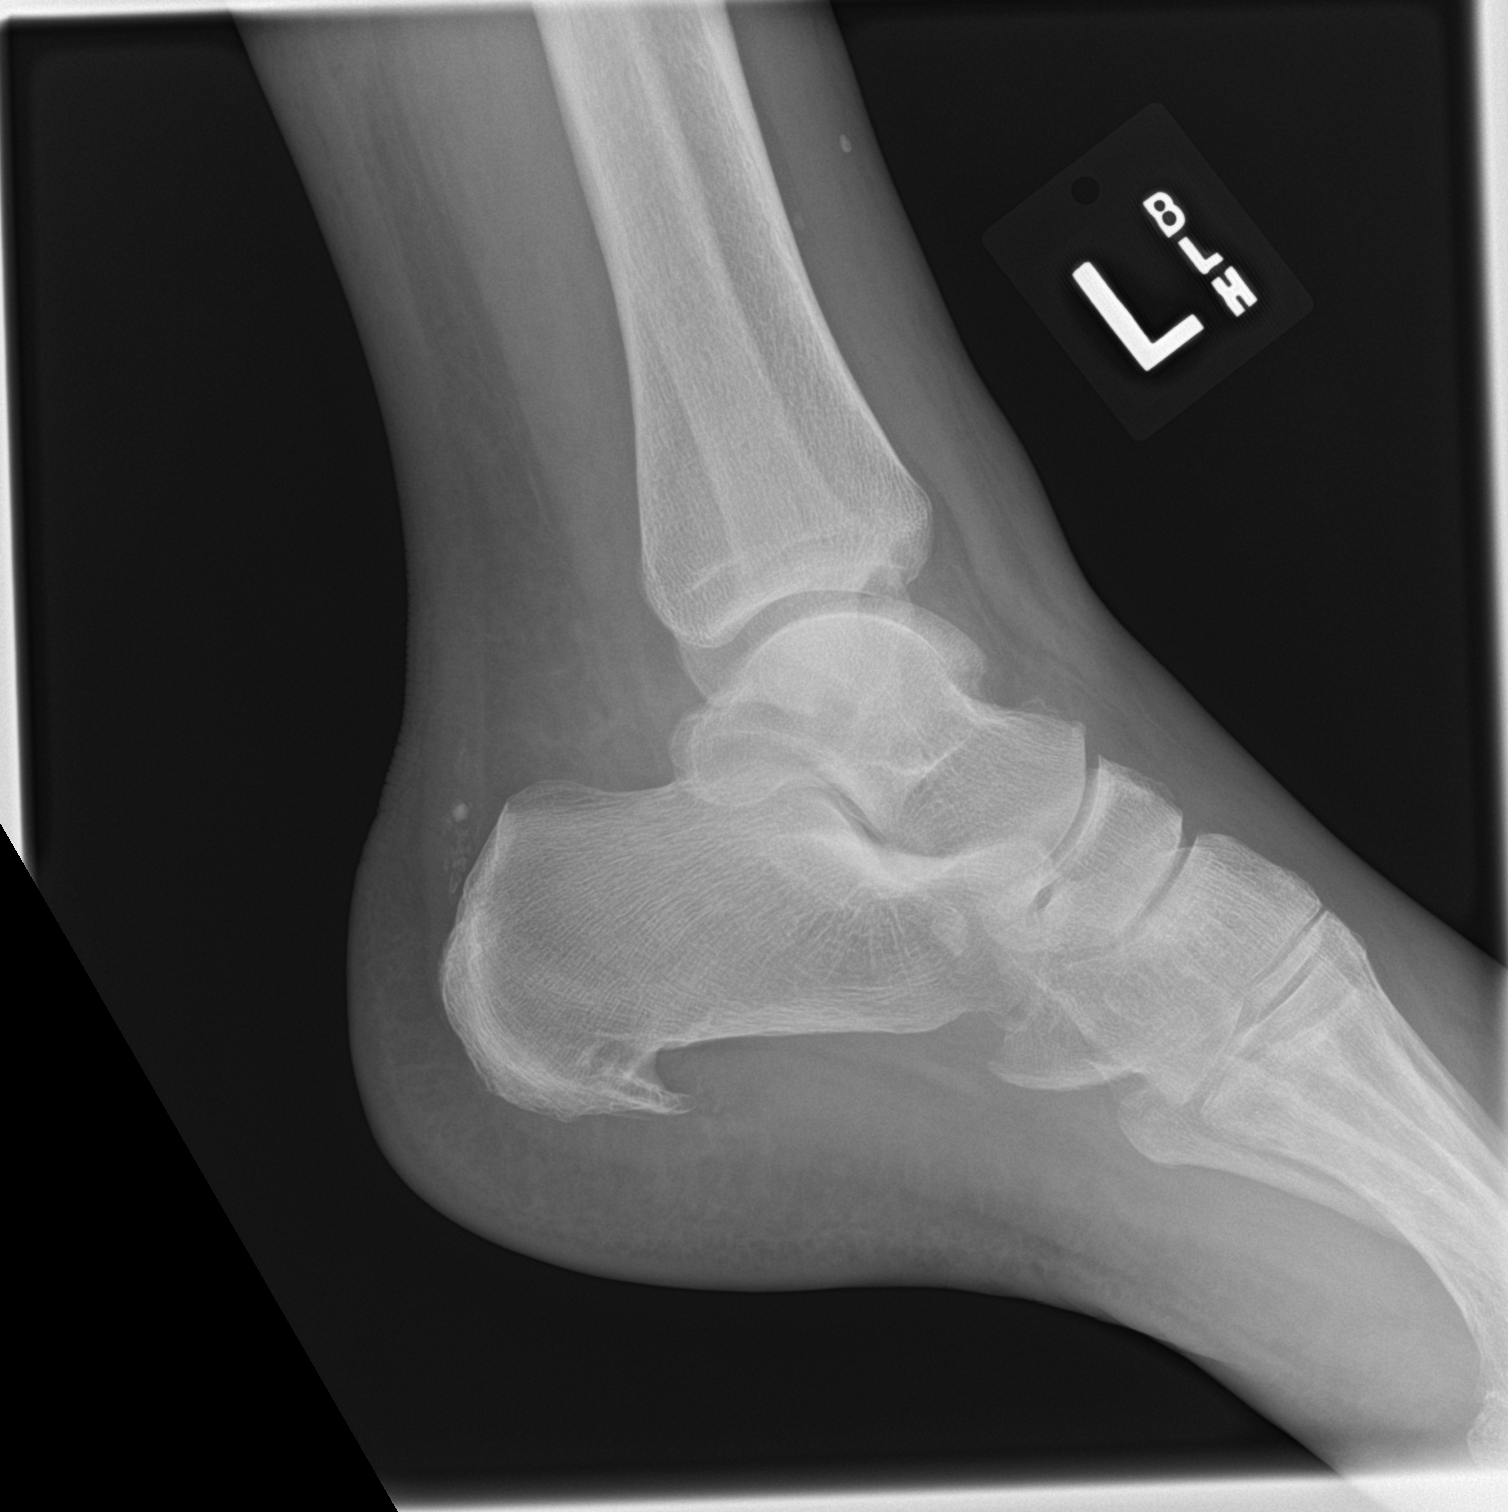

[2 of 2 positions shown; findings below may reference images not displayed]

FINDINGS: There is soft tissue swelling about the ankle. There is no acute
displaced fracture. No dislocation. There is a moderate-sized
plantar calcaneal spur. There is an Achilles tendon enthesophyte.
Degenerative changes are noted of the midfoot.
IMPRESSION: Soft tissue swelling without acute fracture or dislocation.

## 2021-04-23 ENCOUNTER — Telehealth: Payer: Self-pay | Admitting: Pulmonary Disease

## 2021-04-23 DIAGNOSIS — R911 Solitary pulmonary nodule: Secondary | ICD-10-CM

## 2021-04-23 NOTE — Telephone Encounter (Signed)
Called and spoke with Beth [name] pts daughter that is listed on HIPPA. Informed Beth [name] of chest xray results and place new order for CT scan wo contrast for May and reminded her to call our office after she has her scan to get her in for a follow up. Beth [name] voiced understanding. Nothing further needed  ?

## 2021-04-29 DIAGNOSIS — I6930 Unspecified sequelae of cerebral infarction: Secondary | ICD-10-CM | POA: Diagnosis not present

## 2021-04-29 DIAGNOSIS — J9611 Chronic respiratory failure with hypoxia: Secondary | ICD-10-CM | POA: Diagnosis not present

## 2021-04-29 DIAGNOSIS — R634 Abnormal weight loss: Secondary | ICD-10-CM | POA: Diagnosis not present

## 2021-04-29 DIAGNOSIS — M8949 Other hypertrophic osteoarthropathy, multiple sites: Secondary | ICD-10-CM | POA: Diagnosis not present

## 2021-04-29 DIAGNOSIS — J449 Chronic obstructive pulmonary disease, unspecified: Secondary | ICD-10-CM | POA: Diagnosis not present

## 2021-04-29 DIAGNOSIS — E669 Obesity, unspecified: Secondary | ICD-10-CM | POA: Diagnosis not present

## 2021-05-07 DIAGNOSIS — J189 Pneumonia, unspecified organism: Secondary | ICD-10-CM | POA: Insufficient documentation

## 2021-05-07 NOTE — Assessment & Plan Note (Signed)
Start Claritin 10 mg daily.

## 2021-05-07 NOTE — Assessment & Plan Note (Signed)
-   Treated for lobular pneumonia on 2/23 with Levaquin '500mg'$  qd x 7 days. She is feeling better. Sputum clearing. CXR showed improvement in RLL pneumonia and pleural effusion seen on CT imaging. Needs additional follow-up in 3-4 weeks to ensure resolution.  ?

## 2021-05-07 NOTE — Assessment & Plan Note (Signed)
-   Continue hypertonic saline nebulizer twice daily and  Mucinex daily  ?

## 2021-05-13 ENCOUNTER — Other Ambulatory Visit: Payer: Self-pay | Admitting: Neurology

## 2021-05-13 DIAGNOSIS — I609 Nontraumatic subarachnoid hemorrhage, unspecified: Secondary | ICD-10-CM

## 2021-05-13 DIAGNOSIS — F09 Unspecified mental disorder due to known physiological condition: Secondary | ICD-10-CM

## 2021-05-13 DIAGNOSIS — G40409 Other generalized epilepsy and epileptic syndromes, not intractable, without status epilepticus: Secondary | ICD-10-CM

## 2021-05-13 NOTE — Telephone Encounter (Signed)
Last OV was on 09/06/20.  ?Next OV is scheduled for 05/14/21.  ?Last RX was written on 01/17/21 for 135 tabs.  ? ?[place] [facility] Database has been reviewed.  ?

## 2021-05-14 ENCOUNTER — Encounter: Payer: Self-pay | Admitting: Adult Health

## 2021-05-14 ENCOUNTER — Ambulatory Visit (INDEPENDENT_AMBULATORY_CARE_PROVIDER_SITE_OTHER): Payer: Medicare Other | Admitting: Adult Health

## 2021-05-14 VITALS — BP 103/62 | HR 97 | Ht 64.0 in | Wt 202.0 lb

## 2021-05-14 DIAGNOSIS — R569 Unspecified convulsions: Secondary | ICD-10-CM | POA: Diagnosis not present

## 2021-05-14 DIAGNOSIS — R413 Other amnesia: Secondary | ICD-10-CM | POA: Diagnosis not present

## 2021-05-14 NOTE — Progress Notes (Signed)
? ? ?PATIENT: Beth [name] ?DOB: [birth year] ? ?REASON FOR VISIT: follow up ?HISTORY FROM: patient ?PRIMARY NEUROLOGIST: Dohmeier ? ?Chief Complaint  ?Patient presents with  ? Rm 4  ?  Here with husband  ? ? ? ? ?HISTORY OF PRESENT ILLNESS: ?Today 05/14/21: ? ?Beth [name] is a 81 year old female with a history of seizures, CVA and memory disturbance.  She returns today for follow-up.  She enies any seizure events. Continues on Keppra. Tolerating it well ? ?Feels that memory is getting slower and slower but memory is "not so bad."Not operating a motor vehicle. Needs some assistance with ADLs. Had an aid that comes in and her daughter helps her. Needs help with household chores.thinking of getting a home cleaning service. Oldest daughter helps with medications and appointments, fiances. Not taking Namenda. Reports that her mother had trouble with that and so she decided to hold off.  ? ? ?REVIEW OF SYSTEMS: Out of a complete 14 system review of symptoms, the patient complains only of the following symptoms, and all other reviewed systems are negative. ? ?ALLERGIES: ?Allergies  ?Allergen Reactions  ? Prozac [Fluoxetine Hcl] Other (See Comments)  ?  seizures  ? Sulfa Antibiotics Nausea And Vomiting and Other (See Comments)  ?  TINGLING IN LEGS ALSO  ? Micardis [Telmisartan] Swelling and Other (See Comments)  ?  Swelling, bruising ?Swelling, bruising  ? Prednisone Other (See Comments)  ?  mood changes ?Mood changes  ? Augmentin [Amoxicillin-Pot Clavulanate] Rash  ? Avelox [Moxifloxacin Hcl In Nacl] Rash  ?  LEVAQUIN IS OK-SEE NOTE  ? Lisinopril-Hydrochlorothiazide Other (See Comments)  ?  unknown  ? Norvasc [Amlodipine Besylate] Other (See Comments)  ?  fatigue  ? ? ?HOME MEDICATIONS: ?Outpatient Medications Prior to Visit  ?Medication Sig Dispense Refill  ? acetaminophen (TYLENOL) 325 MG tablet Take 1-2 tablets (325-650 mg total) by mouth every 4 (four) hours as needed for mild pain.    ? Albuterol Sulfate, sensor,  (PROAIR DIGIHALER) 108 (90 Base) MCG/ACT AEPB Inhale 1 puff into the lungs as needed.    ? aspirin EC 81 MG tablet Take 81 mg by mouth daily.    ? atorvastatin (LIPITOR) 20 MG tablet TAKE 1 TABLET DAILY 90 tablet 3  ? captopril (CAPOTEN) 50 MG tablet Take 50 mg by mouth 2 (two) times daily.    ? Cholecalciferol (VITAMIN D3) 2000 units capsule Take 2,000 Units by mouth daily.     ? clonazePAM (KLONOPIN) 2 MG tablet TAKE AS INSTRUCTED BY YOUR PRESCRIBER 135 tablet 0  ? diltiazem (CARDIZEM CD) 360 MG 24 hr capsule Take 1 capsule (360 mg total) by mouth daily. 90 capsule 3  ? ELIQUIS 5 MG TABS tablet TAKE 1 TABLET TWICE A DAY 180 tablet 3  ? famotidine (PEPCID) 20 MG tablet Take 1 tablet (20 mg total) by mouth 2 (two) times daily. 60 tablet 0  ? fluticasone (FLONASE) 50 MCG/ACT nasal spray Place 1 spray into the nose daily as needed for allergies. Spray twice daily as needed    ? fluticasone-salmeterol (ADVAIR HFA) 230-21 MCG/ACT inhaler Inhale 2 puffs into the lungs 2 (two) times daily. 36 g 3  ? gabapentin (NEURONTIN) 600 MG tablet TAKE ONE-HALF (1/2) TABLET AT BEDTIME 45 tablet 3  ? levofloxacin (LEVAQUIN) 500 MG tablet Take 1 tablet (500 mg total) by mouth daily. 7 tablet 0  ? LEXAPRO 20 MG tablet Take 1 tablet (20 mg total) by mouth daily. Morning dosage 90 tablet 3  ?  loratadine (CLARITIN) 5 MG chewable tablet Chew 1 tablet (5 mg total) by mouth daily. 30 tablet 2  ? memantine (NAMENDA) 5 MG tablet Take 1 tablet (5 mg total) by mouth 2 (two) times daily. 60 tablet 6  ? metFORMIN (GLUCOPHAGE) 500 MG tablet Take 500 mg by mouth daily.    ? metoprolol tartrate (LOPRESSOR) 25 MG tablet Take 25 mg by mouth as needed.    ? polyethylene glycol (MIRALAX / GLYCOLAX) 17 g packet Take 17 g by mouth daily. Purchase bottle over the counter and use capful daily 14 each 0  ? psyllium (METAMUCIL) 28 % packet 1 packet with 8 ounces of liquid as needed    ? sodium chloride HYPERTONIC 3 % nebulizer solution Take by nebulization in  the morning and at bedtime. 75 mL 11  ? Spacer/Aero-Holding Chambers (AEROCHAMBER PLUS WITH MASK) inhaler As needed with inhaler 1 each 0  ? vitamin B-12 (CYANOCOBALAMIN) 500 MCG tablet Take 1 tablet (500 mcg total) by mouth daily.    ? ?No facility-administered medications prior to visit.  ? ? ?PAST MEDICAL HISTORY: ?Past Medical History:  ?Diagnosis Date  ? Afib ([place])   ? diagnosed 05/17/19  ? Arthritis   ? BRCA2 positive 2017  ? COPD (chronic obstructive pulmonary disease) ([place])   ? Diabetes mellitus without complication ([place])   ? Diverticulosis   ? Dysrhythmia   ? Afib  ? Hearing loss   ? HTN (hypertension)   ? Memory loss   ? Pre-diabetes   ? Seasonal depression ([place])   ? 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 Seizures ([place])   ? anniversary seizure - 1 year after serizure  ? Stroke [facility])   ? 05/2019  ? 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   ? Vestibulitis of ear   ? ? ?PAST SURGICAL HISTORY: ?Past Surgical History:  ?Procedure Laterality Date  ? broken ankle Right   ? TONSILLECTOMY    ? TRANSCAROTID ARTERY REVASCULARIZATION?  Right 06/20/2019  ? TRANSCAROTID ARTERY REVASCULARIZATION?  Right 06/20/2019  ? Procedure: RIGHT TRANSCAROTID ARTERY REVASCULARIZATION;  Surgeon: Angelia Mould, MD;  Location: [facility];  Service: Vascular;  Laterality: Right;  ? ? ?FAMILY HISTORY: ?Family History  ?Problem Relation Age of Onset  ? Dementia Mother   ? Stroke Mother   ? Thyroid disease Mother   ? Osteoporosis Mother   ? Ovarian cancer Paternal Grandmother 54  ? Ovarian cancer Paternal Aunt 77  ? Breast cancer Daughter 14  ?     breast ca x2, bilateral dx. 38, 46--estrogen  ? Other Daughter   ?     no genetic testing as of 07/2015; has had ovaries removed  ? Other Daughter   ?     BRCA 2 Pos.  ? Colon cancer Maternal Aunt   ?     dx. unspecified age  ? Emphysema Maternal Uncle   ? High blood pressure Maternal Grandmother   ? Dementia Maternal Grandfather   ?  Bleeding Disorder Maternal Grandfather   ? Emphysema Maternal Aunt   ? Emphysema Maternal Aunt   ? ? ?SOCIAL HISTORY: ?Social History  ? ?Socioeconomic History  ? Marital status: Married  ?  Spouse name: Jenny Reichmann  ? Number of children: 2  ? Years of education: 32  ? Highest education level: Not on file  ?Occupational History  ? Not on file  ?Tobacco Use  ? Smoking status: Former  ?  Packs/day:  1.00  ?  Years: 41.00  ?  Pack years: 41.00  ?  Types: Cigarettes  ?  Quit date: 01/13/2006  ?  Years since quitting: 15.3  ? Smokeless tobacco: Never  ?Vaping Use  ? Vaping Use: Never used  ?Substance and Sexual Activity  ? Alcohol use: No  ?  Alcohol/week: 0.0 standard drinks  ? Drug use: No  ? Sexual activity: Never  ?  Partners: Male  ?  Birth control/protection: Post-menopausal  ?Other Topics Concern  ? Not on file  ?Social History Narrative  ? Patient is married ([name]) and lives at home with her husband.  ? Patient is retired.  ? Patient has two children.  ? Patient has a high school education.  ? Patient is right-handed.  ? Patient drinks 2 big mugs of coffee daily and sometimes tea in the evenings.  ? ?Social Determinants of Health  ? ?Financial Resource Strain: Not on file  ?Food Insecurity: Not on file  ?Transportation Needs: Not on file  ?Physical Activity: Not on file  ?Stress: Not on file  ?Social Connections: Not on file  ?Intimate Partner Violence: Not on file  ? ? ? ? ?PHYSICAL EXAM ? ?Vitals:  ? 05/14/21 1312  ?BP: 103/62  ?Pulse: 97  ?Weight: 202 lb (91.6 kg)  ?Height: '5\' 4"'  (1.626 m)  ? ?Body mass index is 34.67 kg/m?. ? ?Generalized: Well developed, in no acute distress  ? ?Neurological examination  ?Mentation: Alert oriented to time, place, history taking. Follows all commands speech and language fluent ?Cranial nerve II-XII: Pupils were equal round reactive to light. Extraocular movements were full, visual field were full on confrontational test. Facial sensation and strength were normal. Head turning and  shoulder shrug  were normal and symmetric. ?Motor: The motor testing reveals 5 over 5 strength of all 4 extremities. Good symmetric motor tone is noted throughout.  ?Sensory: Sensory testing is intact to soft touch on

## 2021-05-20 ENCOUNTER — Ambulatory Visit
Admission: RE | Admit: 2021-05-20 | Discharge: 2021-05-20 | Disposition: A | Discharge status: Home / Self Care | Payer: Medicare Other | Source: Ambulatory Visit | Attending: Pulmonary Disease | Admitting: Pulmonary Disease

## 2021-05-20 ENCOUNTER — Other Ambulatory Visit: Payer: Self-pay

## 2021-05-20 DIAGNOSIS — J439 Emphysema, unspecified: Secondary | ICD-10-CM | POA: Diagnosis not present

## 2021-05-20 DIAGNOSIS — R911 Solitary pulmonary nodule: Secondary | ICD-10-CM | POA: Diagnosis not present

## 2021-05-20 DIAGNOSIS — I6521 Occlusion and stenosis of right carotid artery: Secondary | ICD-10-CM

## 2021-05-28 ENCOUNTER — Telehealth: Payer: Self-pay | Admitting: Pulmonary Disease

## 2021-05-28 MED ORDER — LEVOFLOXACIN 500 MG PO TABS: 500.0000 mg | ORAL_TABLET | Freq: Every day | ORAL | 0 refills | Status: AC

## 2021-05-28 NOTE — Telephone Encounter (Signed)
-----   Message from [place], [place] sent at 05/27/2021  4:54 PM EDT ----- ?Called and spoke with patient to let her know of CT scan results. She states that she is having some shortness of breath and using her rescue inhaler once a day. States she has a productive cough with beige sputum. Please advise ?

## 2021-05-28 NOTE — Telephone Encounter (Signed)
Called and spoke with patient to go over the recs from Dr. Elsworth Soho. She expressed understanding and verified preferred pharmacy. RX has been sent. Patient is already scheduled for OV on 06/13/21 with Dr. Elsworth Soho we will leave that appt. Nothing further needed at this time. ?

## 2021-05-28 NOTE — Telephone Encounter (Signed)
Levaquin 500 mg daily x 5 days ?Use flutter valve or incentive spirometr (whichever she has ) at least 4 times/ day ?Use mucinex 600 mg twice adily ? ?Reassess in 2 weeks with APP & CXR ?

## 2021-05-30 ENCOUNTER — Ambulatory Visit: Payer: Medicare Other | Admitting: Vascular Surgery

## 2021-05-30 ENCOUNTER — Ambulatory Visit (HOSPITAL_COMMUNITY): Payer: Medicare Other

## 2021-06-03 DIAGNOSIS — R911 Solitary pulmonary nodule: Secondary | ICD-10-CM | POA: Diagnosis not present

## 2021-06-03 DIAGNOSIS — F3341 Major depressive disorder, recurrent, in partial remission: Secondary | ICD-10-CM | POA: Diagnosis not present

## 2021-06-03 DIAGNOSIS — I10 Essential (primary) hypertension: Secondary | ICD-10-CM | POA: Diagnosis not present

## 2021-06-03 DIAGNOSIS — J449 Chronic obstructive pulmonary disease, unspecified: Secondary | ICD-10-CM | POA: Diagnosis not present

## 2021-06-03 DIAGNOSIS — Z6834 Body mass index (BMI) 34.0-34.9, adult: Secondary | ICD-10-CM | POA: Diagnosis not present

## 2021-06-03 DIAGNOSIS — J9611 Chronic respiratory failure with hypoxia: Secondary | ICD-10-CM | POA: Diagnosis not present

## 2021-06-03 DIAGNOSIS — R7301 Impaired fasting glucose: Secondary | ICD-10-CM | POA: Diagnosis not present

## 2021-06-03 DIAGNOSIS — E7841 Elevated Lipoprotein(a): Secondary | ICD-10-CM | POA: Diagnosis not present

## 2021-06-03 DIAGNOSIS — H6123 Impacted cerumen, bilateral: Secondary | ICD-10-CM | POA: Diagnosis not present

## 2021-06-03 DIAGNOSIS — E669 Obesity, unspecified: Secondary | ICD-10-CM | POA: Diagnosis not present

## 2021-06-11 ENCOUNTER — Telehealth: Payer: Self-pay | Admitting: Adult Health

## 2021-06-11 DIAGNOSIS — I609 Nontraumatic subarachnoid hemorrhage, unspecified: Secondary | ICD-10-CM

## 2021-06-11 DIAGNOSIS — F09 Unspecified mental disorder due to known physiological condition: Secondary | ICD-10-CM

## 2021-06-11 DIAGNOSIS — H6123 Impacted cerumen, bilateral: Secondary | ICD-10-CM | POA: Diagnosis not present

## 2021-06-11 DIAGNOSIS — G40409 Other generalized epilepsy and epileptic syndromes, not intractable, without status epilepticus: Secondary | ICD-10-CM

## 2021-06-11 MED ORDER — CLONAZEPAM 2 MG PO TABS: ORAL_TABLET | ORAL | 0 refills | Status: DC

## 2021-06-11 NOTE — Telephone Encounter (Signed)
[facility] Jan[facility] Billow) Since clonazePAM (KLONOPIN) 2 MG tablet is a controlled substance; need more instructions to put on the label. Would like a call from nurse.  Reference no: [id number]

## 2021-06-11 NOTE — Telephone Encounter (Deleted)
Completed Neupro patch PA on Cover My Meds. Key: [id number]. Awaiting determination from [facility].

## 2021-06-13 ENCOUNTER — Encounter: Payer: Self-pay | Admitting: Pulmonary Disease

## 2021-06-13 ENCOUNTER — Ambulatory Visit (INDEPENDENT_AMBULATORY_CARE_PROVIDER_SITE_OTHER): Payer: Medicare Other

## 2021-06-13 ENCOUNTER — Ambulatory Visit (INDEPENDENT_AMBULATORY_CARE_PROVIDER_SITE_OTHER): Payer: Medicare Other | Admitting: Pulmonary Disease

## 2021-06-13 VITALS — BP 128/72 | HR 81 | Temp 98.0°F | Ht 64.0 in | Wt 205.4 lb

## 2021-06-13 DIAGNOSIS — R911 Solitary pulmonary nodule: Secondary | ICD-10-CM | POA: Diagnosis not present

## 2021-06-13 DIAGNOSIS — R1310 Dysphagia, unspecified: Secondary | ICD-10-CM | POA: Diagnosis not present

## 2021-06-13 DIAGNOSIS — J189 Pneumonia, unspecified organism: Secondary | ICD-10-CM | POA: Diagnosis not present

## 2021-06-13 DIAGNOSIS — I6521 Occlusion and stenosis of right carotid artery: Secondary | ICD-10-CM | POA: Diagnosis not present

## 2021-06-13 DIAGNOSIS — R059 Cough, unspecified: Secondary | ICD-10-CM

## 2021-06-13 DIAGNOSIS — J181 Lobar pneumonia, unspecified organism: Secondary | ICD-10-CM

## 2021-06-13 DIAGNOSIS — J90 Pleural effusion, not elsewhere classified: Secondary | ICD-10-CM | POA: Diagnosis not present

## 2021-06-13 DIAGNOSIS — J9611 Chronic respiratory failure with hypoxia: Secondary | ICD-10-CM

## 2021-06-13 MED ORDER — LEVOFLOXACIN 500 MG PO TABS: 500.0000 mg | ORAL_TABLET | Freq: Every day | ORAL | 0 refills | Status: AC

## 2021-06-13 NOTE — Progress Notes (Signed)
Subjective:    Patient ID: Beth [name], female    DOB: 1940/05/26, 81 y.o.   MRN: [id number]  HPI  81 yo former smoker for FU of COPD & chronic respiratory failure    PMH :  HTN, organic brain syndrome, stroke, BRCA2 positive, Obesity. 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  Subsequent seizures. Has some word finding issues and cognitive deficits with emotional lability    To clarify she does not have a diagnosis of breast cancer, she only had BRCA positive after her daughter was diagnosed with breast cancer  After reviewing PET scan results last visit, we treated her with Levaquin, we reviewed follow-up CT chest which showed left lower lobe consolidation and small effusion.  She was given another 5 days of Levaquin on 5/16. Today she complains of persistent dyspnea and increased use of albuterol.  She also reports that congestion.  She reports decreased pedal production. She has been using Mucinex although at a lower dose she admits to not being as compliant with hypertonic saline nebs and with flutter valve. She is accompanied by her daughter Beth [name] who corroborates history. Arrives on oxygen, with a walker    Significant tests/ events reviewed  CT chest 05/2021 right lower lobe consolidation overall, right lower lobe nodule unchanged, left lower lobe consolidation/small effusion, left lower lobe nodule not noted  02/2021 PET >> right lower lobe consolidation , right lower lobe nodule not visualized, left lower lobe 1.1 cm nodule hypermetabolic, mediastinal lymph nodes hypermetabolic 0/0867 HRCT was obtained on 02/20/2021 which showed that right lower lobe consolidation had resolved with minimal scarring, no ILD was noted, there was 1 enlarging 1.4 x 1.2 cm nodule.  12/2020 CT chest without contrast 12/9 - bronchiectasis at the bases likely related to mucoid impaction   12/2018 CT chest  Scattered pulmonary nodules are seen in the right lower lobe, the largest measuring up to 1.1 cm in diameter.      CT angio chest 07/2015 >> no pulmonary embolism, no emphysema, calcified mediastinal LNs PFTs 09/2015>> severe airway obstruction with FEV1 40%, improves to 47% with albuterol, DLCO decreased to 45%  Review of Systems neg for any significant sore throat, dysphagia, itching, sneezing, nasal congestion or excess/ purulent secretions, fever, chills, sweats, unintended wt loss, pleuritic or exertional cp, hempoptysis, orthopnea pnd or change in chronic leg swelling. Also denies presyncope, palpitations, heartburn, abdominal pain, nausea, vomiting, diarrhea or change in bowel or urinary habits, dysuria,hematuria, rash, arthralgias, visual complaints, headache, numbness weakness or ataxia.     Objective:   Physical Exam   Gen. Pleasant, obese, in no distress ENT - no lesions, no post nasal drip Neck: No JVD, no thyromegaly, no carotid bruits Lungs: no use of accessory muscles, no dullness to percussion, decreased LT without rales or rhonchi  Cardiovascular: Rhythm regular, heart sounds  normal, no murmurs or gallops, no peripheral edema Musculoskeletal: No deformities, no cyanosis or clubbing , no tremors        Assessment & Plan:     Assessment:    1 or more chronic illnesses with severe exacerbation, progression, or side effects of treatment;   1 acute or chronic illness or injury that poses a threat to life or bodily function  Plan Following Extensive Data Review & Interpretation:   I reviewed prior external note(s) from neurology  I reviewed the result(s) of chest x-ray, CT scan  and PET scan  I have ordered ultrasound-guided thoracentesis  Independent interpretation of tests  Review of patient's chest x-ray images revealed parapneumonic effusion. The patient's images have been independently reviewed by me.    Discussion of management or test interpretation with  another colleague -referral to outpatient palliative care.

## 2021-06-13 NOTE — Assessment & Plan Note (Signed)
I remain concerned about malignancy. The right lower lobe nodule is unchanged in size from December which is reassuring for slow growth. I discussed possibility of SBRT should this nodule: Size.  She is very comfortable with the idea of no treatment and would be agreeable to palliation should this turn out to be cancer.  The left lower lobe nodule cannot be visualized due to current consolidation.  I suggested outpatient palliative care consultation and she is agreeable to proceed

## 2021-06-13 NOTE — Patient Instructions (Addendum)
  X CXR today  X swallow test   X outpatient palliative care referral   X Levaquin 500 mg daily x 10 days

## 2021-06-13 NOTE — Assessment & Plan Note (Addendum)
Recurrent pneumonia in different lobes. 3 months ago she had a right lower lobe consolidation which improved with antibiotics. Now she has a left lower lobe consolidation.  Concern for recurrent aspiration in the setting. Chest x-ray obtained today reviewed by me shows left lower lobe consolidation with parapneumonic effusion. We will schedule ultrasound guided thoracentesis, apixaban can be stopped 2 days prior.  Discussed risks and benefits with patient and she is agreeable. Due to concern for aspiration we will also schedule modified barium swallow with therapist  I will treat her with Levaquin for an extended duration of 10 days given underlying bronchiectasis

## 2021-06-14 ENCOUNTER — Other Ambulatory Visit (HOSPITAL_COMMUNITY): Payer: Self-pay

## 2021-06-14 ENCOUNTER — Telehealth: Payer: Self-pay | Admitting: Pulmonary Disease

## 2021-06-14 DIAGNOSIS — R1310 Dysphagia, unspecified: Secondary | ICD-10-CM

## 2021-06-14 DIAGNOSIS — R059 Cough, unspecified: Secondary | ICD-10-CM

## 2021-06-14 NOTE — Telephone Encounter (Signed)
Pt has been scheduled for 6/6.  I spoke to dtr and gave her appt info.  Nothing further needed.

## 2021-06-14 NOTE — Telephone Encounter (Signed)
Order was placed yesterday for thoracenteis and does not indicate when it is to be done.  I called to get it scheduled and can be done Monday or Tuesday next week.  I called pt's cell # listed which is for dtr [name] - on dpr.  She states she thought mom was to finish antibiotic before thoracentesis is to be done.  I told her I would check with RA and call her back.  Please advise when this should be done.

## 2021-06-18 ENCOUNTER — Ambulatory Visit (HOSPITAL_COMMUNITY)
Admission: RE | Admit: 2021-06-18 | Discharge: 2021-06-18 | Disposition: A | Discharge status: Home / Self Care | Payer: Medicare Other | Source: Ambulatory Visit | Attending: Student | Admitting: Student

## 2021-06-18 ENCOUNTER — Ambulatory Visit (HOSPITAL_COMMUNITY)
Admission: RE | Admit: 2021-06-18 | Discharge: 2021-06-18 | Disposition: A | Discharge status: Home / Self Care | Payer: Medicare Other | Source: Ambulatory Visit | Attending: Pulmonary Disease | Admitting: Pulmonary Disease

## 2021-06-18 ENCOUNTER — Other Ambulatory Visit (HOSPITAL_COMMUNITY): Payer: Self-pay | Admitting: Student

## 2021-06-18 DIAGNOSIS — J90 Pleural effusion, not elsewhere classified: Secondary | ICD-10-CM | POA: Insufficient documentation

## 2021-06-18 DIAGNOSIS — R846 Abnormal cytological findings in specimens from respiratory organs and thorax: Secondary | ICD-10-CM | POA: Diagnosis not present

## 2021-06-18 DIAGNOSIS — R091 Pleurisy: Secondary | ICD-10-CM | POA: Diagnosis not present

## 2021-06-18 DIAGNOSIS — Z9889 Other specified postprocedural states: Secondary | ICD-10-CM

## 2021-06-18 DIAGNOSIS — J189 Pneumonia, unspecified organism: Secondary | ICD-10-CM | POA: Insufficient documentation

## 2021-06-18 DIAGNOSIS — R918 Other nonspecific abnormal finding of lung field: Secondary | ICD-10-CM | POA: Diagnosis not present

## 2021-06-18 HISTORY — PX: IR THORACENTESIS ASP PLEURAL SPACE W/IMG GUIDE: IMG5380

## 2021-06-18 LAB — BODY FLUID CELL COUNT WITH DIFFERENTIAL
Eos, Fluid: 2 %
Lymphs, Fluid: 58 %
Monocyte-Macrophage-Serous Fluid: 28 % — ABNORMAL LOW (ref 50–90)
Neutrophil Count, Fluid: 11 % (ref 0–25)
Other Cells, Fluid: 1 %
Total Nucleated Cell Count, Fluid: 2730 cu mm — ABNORMAL HIGH (ref 0–1000)

## 2021-06-18 LAB — GRAM STAIN

## 2021-06-18 LAB — GLUCOSE, PLEURAL OR PERITONEAL FLUID: Glucose, Fluid: 118 mg/dL

## 2021-06-18 LAB — LACTATE DEHYDROGENASE, PLEURAL OR PERITONEAL FLUID: LD, Fluid: 252 U/L — ABNORMAL HIGH (ref 3–23)

## 2021-06-18 LAB — PROTEIN, PLEURAL OR PERITONEAL FLUID: Total protein, fluid: 3.2 g/dL

## 2021-06-18 MED ORDER — LIDOCAINE HCL (PF) 1 % IJ SOLN
INTRAMUSCULAR | Status: DC | PRN
  Administered 2021-06-18: 10 mL

## 2021-06-18 MED ORDER — LIDOCAINE HCL 1 % IJ SOLN
INTRAMUSCULAR | Status: AC
  Filled 2021-06-18: qty 20

## 2021-06-18 NOTE — Procedures (Signed)
PROCEDURE SUMMARY:  Successful US guided left thoracentesis. Yielded 150 ml of light pink fluid. Pt tolerated procedure well. No immediate complications.  Specimen sent for labs. CXR ordered; no post-procedure pneumothorax identified  EBL < 2 mL  Theresa Duty, NP 06/18/2021 11:43 AM

## 2021-06-19 LAB — CYTOLOGY - NON PAP

## 2021-06-20 ENCOUNTER — Ambulatory Visit (HOSPITAL_COMMUNITY)
Admission: RE | Admit: 2021-06-20 | Discharge: 2021-06-20 | Disposition: A | Discharge status: Home / Self Care | Payer: Medicare Other | Source: Ambulatory Visit | Attending: Pulmonary Disease | Admitting: Pulmonary Disease

## 2021-06-20 DIAGNOSIS — R911 Solitary pulmonary nodule: Secondary | ICD-10-CM | POA: Insufficient documentation

## 2021-06-20 DIAGNOSIS — R569 Unspecified convulsions: Secondary | ICD-10-CM | POA: Diagnosis not present

## 2021-06-20 DIAGNOSIS — I10 Essential (primary) hypertension: Secondary | ICD-10-CM | POA: Insufficient documentation

## 2021-06-20 DIAGNOSIS — F0150 Vascular dementia without behavioral disturbance: Secondary | ICD-10-CM | POA: Insufficient documentation

## 2021-06-20 DIAGNOSIS — H9190 Unspecified hearing loss, unspecified ear: Secondary | ICD-10-CM | POA: Insufficient documentation

## 2021-06-20 DIAGNOSIS — I4891 Unspecified atrial fibrillation: Secondary | ICD-10-CM | POA: Insufficient documentation

## 2021-06-20 DIAGNOSIS — R059 Cough, unspecified: Secondary | ICD-10-CM | POA: Diagnosis not present

## 2021-06-20 DIAGNOSIS — Z8673 Personal history of transient ischemic attack (TIA), and cerebral infarction without residual deficits: Secondary | ICD-10-CM | POA: Diagnosis not present

## 2021-06-20 DIAGNOSIS — E119 Type 2 diabetes mellitus without complications: Secondary | ICD-10-CM | POA: Insufficient documentation

## 2021-06-20 DIAGNOSIS — F09 Unspecified mental disorder due to known physiological condition: Secondary | ICD-10-CM | POA: Diagnosis not present

## 2021-06-20 DIAGNOSIS — M1990 Unspecified osteoarthritis, unspecified site: Secondary | ICD-10-CM | POA: Insufficient documentation

## 2021-06-20 DIAGNOSIS — J181 Lobar pneumonia, unspecified organism: Secondary | ICD-10-CM

## 2021-06-20 DIAGNOSIS — J449 Chronic obstructive pulmonary disease, unspecified: Secondary | ICD-10-CM | POA: Insufficient documentation

## 2021-06-20 DIAGNOSIS — Z8701 Personal history of pneumonia (recurrent): Secondary | ICD-10-CM | POA: Diagnosis not present

## 2021-06-20 DIAGNOSIS — R1313 Dysphagia, pharyngeal phase: Secondary | ICD-10-CM | POA: Insufficient documentation

## 2021-06-20 DIAGNOSIS — R1310 Dysphagia, unspecified: Secondary | ICD-10-CM

## 2021-06-20 NOTE — Progress Notes (Addendum)
Modified Barium Swallow Progress Note  Patient Details  Name: Beth [name] MRN: [id number] Date of Birth: August 20, 1940  Today's Date: 06/20/2021  Modified Barium Swallow completed.  Full report located under Chart Review in the Imaging Section.  Brief recommendations include the following:  Clinical Impression   Pt has a mild pharyngeal dysphagia characterized primarily by a brief delay in swallow initiation. She is able to achieve good airway protection, but it comes after boluses are already sitting in the pharynx. She ends up penetrating with thin liquids often, but this primarily clears spontaneously (PAS 2). She did however have a single instance of silent aspiration (PAS 8) that occurred when she was try to tip her cup back to get to the bottom of it and she felt like she was swallowing "differently." Pilar Plate penetration was also observed after taking the barium tablet with a liquid wash (PAS 3), although the entire pharyngeal phase was not captured under fluoro and so aspiration cannot be fully ruled out (although nothing new was observed below the vocal folds after the fact). No other overt aspiration was observed even when challenged with additional consecutive boluses, but pt seemed to have the best control when taking even just a brief pause in between boluses.There did also appear to be some barium remaining in the esophagus at the end of the study, which could be contributing to solids feeling stuck. Would continue with regular solids and thin liquids, taking single sips at a time. In light of recent respiratory issues, could also take medications whole in puree as a precaution and could consider OP SLP.   Swallow Evaluation Recommendations       SLP Diet Recommendations: Regular solids;Thin liquid   Liquid Administration via: Cup;Straw   Medication Administration: Whole meds with puree   Supervision: Patient able to self feed   Compensations: Slow rate;Small sips/bites;Other  (Comment) (one sip at a time - pause briefly in between sips)   Postural Changes: Seated upright at 90 degrees;Remain semi-upright after after feeds/meals (Comment)   Oral Care Recommendations: Oral care BID        Osie Bond., M.A. [place] Office [phone]  Secure chat preferred  06/20/2021,2:15 PM

## 2021-06-21 ENCOUNTER — Other Ambulatory Visit: Payer: Self-pay

## 2021-06-21 DIAGNOSIS — J90 Pleural effusion, not elsewhere classified: Secondary | ICD-10-CM

## 2021-06-23 LAB — CULTURE, BODY FLUID W GRAM STAIN -BOTTLE: Culture: NO GROWTH

## 2021-06-24 ENCOUNTER — Other Ambulatory Visit: Payer: Self-pay | Admitting: Interventional Cardiology

## 2021-06-27 ENCOUNTER — Telehealth: Payer: Self-pay

## 2021-06-27 NOTE — Telephone Encounter (Signed)
Attempted to contact patient's daughter [name] to schedule a Palliative Care consult appointment. No answer left a message to return call.

## 2021-07-04 ENCOUNTER — Encounter: Payer: Self-pay | Admitting: Vascular Surgery

## 2021-07-04 ENCOUNTER — Ambulatory Visit (HOSPITAL_COMMUNITY)
Admission: RE | Admit: 2021-07-04 | Discharge: 2021-07-04 | Disposition: A | Discharge status: Home / Self Care | Payer: Medicare Other | Source: Ambulatory Visit | Attending: Vascular Surgery | Admitting: Vascular Surgery

## 2021-07-04 ENCOUNTER — Ambulatory Visit (INDEPENDENT_AMBULATORY_CARE_PROVIDER_SITE_OTHER): Payer: Medicare Other | Admitting: Vascular Surgery

## 2021-07-04 VITALS — BP 122/68 | HR 87 | Temp 97.9°F | Resp 20 | Ht 64.0 in | Wt 205.0 lb

## 2021-07-04 DIAGNOSIS — I6521 Occlusion and stenosis of right carotid artery: Secondary | ICD-10-CM

## 2021-07-04 NOTE — Progress Notes (Signed)
REASON FOR VISIT:   Follow-up after TCAR  MEDICAL ISSUES:   HISTORY OF RIGHT CAROTID STENOSIS: This patient had asymptomatic right carotid stenosis that was addressed with a TCAR.  She comes in for routine follow-up visit.  Her stent is widely patent.  She has no significant stenosis on the left.  She is on aspirin and is on a statin.  I ordered a follow-up carotid duplex scan in 1 year and I will see her back at that time.  She knows to call sooner if she has problems.  HPI:   Beth [name] is a pleasant 81 y.o. female who presented with a right carotid stenosis which was symptomatic.  She had a right brain stroke.  She had a high bifurcation on the right and I did not think this stenosis was surgically accessible.  On 06/20/2019 she underwent a TCAR.  When I saw her last on 05/23/2020 her stent was patent.  She was doing well.  She was on aspirin and a statin.  She was no longer on Plavix as she was on Eliquis.  She was set up for a 1 year follow-up visit.  Since I saw her last, she denies any focal weakness or paresthesias.  She had no expressive or receptive aphasia, or amaurosis fugax.  She is on aspirin and is on a statin.  She is on Eliquis so for this reason she is not on Plavix.  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    Vestibulitis of ear     Family History  Problem Relation Age of Onset   Dementia Mother    Stroke Mother    Thyroid  disease Mother    Osteoporosis Mother    Ovarian cancer Paternal Grandmother 26   Ovarian cancer Paternal Aunt 3   Breast cancer Daughter 69       breast ca x2, bilateral dx. 29, 46--estrogen   Other Daughter        no genetic testing as of 07/2015; has had ovaries removed   Other Daughter        BRCA 2 Pos.   Colon cancer Maternal Aunt        dx. unspecified age   Emphysema Maternal Uncle    High blood pressure Maternal Grandmother    Dementia Maternal Grandfather    Bleeding Disorder Maternal Grandfather    Emphysema Maternal Aunt    Emphysema Maternal Aunt     SOCIAL HISTORY: Social History   Tobacco Use   Smoking status: Former    Packs/day: 1.00    Years: 41.00    Total pack years: 41.00    Types: Cigarettes    Quit date: 01/13/2006    Years since quitting: 15.4   Smokeless tobacco: Never  Substance Use Topics   Alcohol use: No  Alcohol/week: 0.0 standard drinks of alcohol    Allergies  Allergen Reactions   Prozac [Fluoxetine Hcl] Other (See Comments)    seizures   Sulfa Antibiotics Nausea And Vomiting and Other (See Comments)    TINGLING IN LEGS ALSO   Micardis [Telmisartan] Swelling and Other (See Comments)    Swelling, bruising Swelling, bruising   Prednisone Other (See Comments)    mood changes Mood changes   Allegra [Fexofenadine]     Makes her sleep 24 hours    Prozac [Fluoxetine]     seizure   Augmentin [Amoxicillin-Pot Clavulanate] Rash   Avelox [Moxifloxacin Hcl In Nacl] Rash    LEVAQUIN IS OK-SEE NOTE   Lisinopril-Hydrochlorothiazide Other (See Comments)    unknown   Norvasc [Amlodipine Besylate] Other (See Comments)    fatigue    Current Outpatient Medications  Medication Sig Dispense Refill   acetaminophen (TYLENOL) 325 MG tablet Take 1-2 tablets (325-650 mg total) by mouth every 4 (four) hours as needed for mild pain.     Albuterol Sulfate, sensor, (PROAIR DIGIHALER) 108 (90 Base) MCG/ACT AEPB Inhale 2 puffs into the lungs every 4  (four) hours as needed.     aspirin EC 81 MG tablet Take 81 mg by mouth daily.     atorvastatin (LIPITOR) 20 MG tablet TAKE 1 TABLET DAILY 90 tablet 3   captopril (CAPOTEN) 50 MG tablet Take 50 mg by mouth 2 (two) times daily.     Cholecalciferol (VITAMIN D3) 2000 units capsule Take 2,000 Units by mouth daily.      clonazePAM (KLONOPIN) 2 MG tablet Take 1 tablet at bedtime,  may take additional 0.5 once daily tablet if necessary for panic attack. 135 tablet 0   diltiazem (CARDIZEM CD) 360 MG 24 hr capsule TAKE 1 CAPSULE DAILY (DOSE INCREASE) 90 capsule 2   ELIQUIS 5 MG TABS tablet TAKE 1 TABLET TWICE A DAY 180 tablet 3   famotidine (PEPCID) 20 MG tablet Take 1 tablet (20 mg total) by mouth 2 (two) times daily. 60 tablet 0   fluticasone (FLONASE) 50 MCG/ACT nasal spray Place 1 spray into the nose daily as needed for allergies. Spray twice daily as needed     fluticasone-salmeterol (ADVAIR HFA) 230-21 MCG/ACT inhaler Inhale 2 puffs into the lungs 2 (two) times daily. 36 g 3   gabapentin (NEURONTIN) 600 MG tablet TAKE ONE-HALF (1/2) TABLET AT BEDTIME 45 tablet 3   guaiFENesin (MUCINEX) 600 MG 12 hr tablet Take 600 mg by mouth 2 (two) times daily.     levETIRAcetam (KEPPRA) 750 MG tablet Take 750 mg by mouth 2 (two) times daily.     LEXAPRO 20 MG tablet Take 1 tablet (20 mg total) by mouth daily. Morning dosage 90 tablet 3   loratadine (CLARITIN) 5 MG chewable tablet Chew 1 tablet (5 mg total) by mouth daily. 30 tablet 2   memantine (NAMENDA) 5 MG tablet Take 1 tablet (5 mg total) by mouth 2 (two) times daily. 60 tablet 6   metFORMIN (GLUCOPHAGE) 500 MG tablet Take 500 mg by mouth daily.     metoprolol tartrate (LOPRESSOR) 25 MG tablet Take 25 mg by mouth 2 (two) times daily as needed.     MICONAZOLE NITRATE EX Apply topically.     polyethylene glycol (MIRALAX / GLYCOLAX) 17 g packet Take 17 g by mouth daily. Purchase bottle over the counter and use capful daily (Patient taking differently: Take 17 g  by mouth daily as needed. Purchase bottle over the  counter and use capful daily) 14 each 0   psyllium (METAMUCIL) 28 % packet 1 packet with 8 ounces of liquid as needed     sodium chloride HYPERTONIC 3 % nebulizer solution Take by nebulization in the morning and at bedtime. 75 mL 11   Spacer/Aero-Holding Chambers (AEROCHAMBER PLUS WITH MASK) inhaler As needed with inhaler 1 each 0   vitamin B-12 (CYANOCOBALAMIN) 500 MCG tablet Take 1 tablet (500 mcg total) by mouth daily.     No current facility-administered medications for this visit.    REVIEW OF SYSTEMS:  '[X]'  denotes positive finding, '[ ]'  denotes negative finding Cardiac  Comments:  Chest pain or chest pressure:    Shortness of breath upon exertion: x   Short of breath when lying flat:    Irregular heart rhythm: x       Vascular    Pain in calf, thigh, or hip brought on by ambulation: x   Pain in feet at night that wakes you up from your sleep:     Blood clot in your veins:    Leg swelling:  x       Pulmonary    Oxygen at home: x   Productive cough:     Wheezing:         Neurologic    Sudden weakness in arms or legs:     Sudden numbness in arms or legs:     Sudden onset of difficulty speaking or slurred speech:    Temporary loss of vision in one eye:     Problems with dizziness:         Gastrointestinal    Blood in stool:     Vomited blood:         Genitourinary    Burning when urinating:     Blood in urine:        Psychiatric    Major depression:         Hematologic    Bleeding problems:    Problems with blood clotting too easily:        Skin    Rashes or ulcers:        Constitutional    Fever or chills:     PHYSICAL EXAM:   Vitals:   07/04/21 1050 07/04/21 1055  BP: 120/64 122/68  Pulse: 87   Resp: 20   Temp: 97.9 F (36.6 C)   SpO2: 94%   Weight: 205 lb (93 kg)   Height: '5\' 4"'  (1.626 m)     GENERAL: The patient is a well-nourished female, in no acute distress. The vital signs are documented  above. CARDIAC: There is a regular rate and rhythm.  VASCULAR: I do not detect carotid bruits. She has palpable radial pulses. PULMONARY: There is good air exchange bilaterally without wheezing or rales. ABDOMEN: Soft and non-tender with normal pitched bowel sounds.  MUSCULOSKELETAL: There are no major deformities or cyanosis. NEUROLOGIC: No focal weakness or paresthesias are detected. SKIN: There are no ulcers or rashes noted. PSYCHIATRIC: The patient has a normal affect.  DATA:    CAROTID DUPLEX: I have independently interpreted her carotid duplex scan today.  On the right side the carotid stent is widely patent.  The right vertebral artery is patent with antegrade flow.  On the left side there is a less than 39% stenosis.  The left vertebral artery is patent with antegrade flow.  [name] [facility] [phone]

## 2021-07-08 ENCOUNTER — Encounter: Payer: Self-pay | Admitting: Nurse Practitioner

## 2021-07-08 ENCOUNTER — Telehealth: Payer: Self-pay

## 2021-07-08 ENCOUNTER — Ambulatory Visit (INDEPENDENT_AMBULATORY_CARE_PROVIDER_SITE_OTHER): Payer: Medicare Other | Admitting: Nurse Practitioner

## 2021-07-08 ENCOUNTER — Ambulatory Visit (INDEPENDENT_AMBULATORY_CARE_PROVIDER_SITE_OTHER): Payer: Medicare Other

## 2021-07-08 VITALS — BP 118/68 | HR 80 | Temp 97.8°F

## 2021-07-08 DIAGNOSIS — J479 Bronchiectasis, uncomplicated: Secondary | ICD-10-CM

## 2021-07-08 DIAGNOSIS — J90 Pleural effusion, not elsewhere classified: Secondary | ICD-10-CM | POA: Diagnosis not present

## 2021-07-08 DIAGNOSIS — R911 Solitary pulmonary nodule: Secondary | ICD-10-CM | POA: Diagnosis not present

## 2021-07-08 DIAGNOSIS — J189 Pneumonia, unspecified organism: Secondary | ICD-10-CM | POA: Diagnosis not present

## 2021-07-08 DIAGNOSIS — J441 Chronic obstructive pulmonary disease with (acute) exacerbation: Secondary | ICD-10-CM | POA: Diagnosis not present

## 2021-07-08 MED ORDER — REVEFENACIN 175 MCG/3ML IN SOLN: 175.0000 ug | Freq: Every day | RESPIRATORY_TRACT | 3 refills | Status: DC

## 2021-07-08 MED ORDER — ARFORMOTEROL TARTRATE 15 MCG/2ML IN NEBU: 15.0000 ug | INHALATION_SOLUTION | Freq: Two times a day (BID) | RESPIRATORY_TRACT | 3 refills | Status: DC

## 2021-07-08 MED ORDER — BUDESONIDE 0.5 MG/2ML IN SUSP: 0.5000 mg | Freq: Two times a day (BID) | RESPIRATORY_TRACT | 3 refills | Status: DC

## 2021-07-08 MED ORDER — PREDNISONE 10 MG PO TABS: ORAL_TABLET | ORAL | 0 refills | Status: DC

## 2021-07-08 NOTE — Assessment & Plan Note (Signed)
Multiple courses of abx for recurrent pnas; most recent beginning of this month for 10 day course. Minimal improvement on CXR today, could be atelectasis vs slowly resolving pna. Cough is overall improved with minimal sputum production. Close follow up.

## 2021-07-10 ENCOUNTER — Telehealth: Payer: Self-pay | Admitting: Nurse Practitioner

## 2021-07-10 DIAGNOSIS — J90 Pleural effusion, not elsewhere classified: Secondary | ICD-10-CM

## 2021-07-11 ENCOUNTER — Other Ambulatory Visit: Payer: Self-pay | Admitting: Neurology

## 2021-07-15 ENCOUNTER — Encounter: Payer: Self-pay | Admitting: Family Medicine

## 2021-07-15 ENCOUNTER — Telehealth: Payer: Medicare Other | Admitting: Family Medicine

## 2021-07-15 DIAGNOSIS — Z636 Dependent relative needing care at home: Secondary | ICD-10-CM | POA: Diagnosis not present

## 2021-07-15 DIAGNOSIS — Z515 Encounter for palliative care: Secondary | ICD-10-CM

## 2021-07-15 NOTE — Progress Notes (Signed)
[facility] Consult Note Telephone: [phone]  Fax: [phone]   Date of encounter: 07/15/21 2:33 PM PATIENT NAME: CLAY MENSER [address] [place] [address]   [phone] (home)  DOB: March 02, 1940 MRN: [id number] PRIMARY CARE PROVIDER:    Kathyrn Lass, MD,  [place] [address] [phone]  REFERRING PROVIDER:   Kathyrn Lass, [place],  [place] [address] [phone]  RESPONSIBLE PARTY:    Contact Information     Name Relation Home Work [place] Daughter   [phone]   [place], [place]   [phone]     I connected with  LAQUANDA BICK and daughter [name] on 07/15/21 by a video enabled telemedicine application and verified that I am speaking with the correct person using two identifiers.   I discussed the limitations of evaluation and management by telemedicine. The patient expressed understanding and agreed to proceed.    Palliative Care was asked to follow this patient by consultation request of  Kathyrn Lass, MD to address advance care planning and complex medical decision making. This is the initial visit.          ASSESSMENT, SYMPTOM MANAGEMENT AND PLAN / RECOMMENDATIONS:   Palliative Care Encounter Discussed code status and goals of care with pt and daughter. Spouse is living and is [facility] POA along with daughter but has early dementia  2.    Caregiver burden Pt has residual deficits from prior stroke and unable to help in caring for spouse, daughter caring for both parents, looking for resources to help with in -home care. Advised pt and daughter that spouse was a candidate for Palliative Care and provided phone number for [facility] for spouse's PCP to call referral for Palliative. SW referral to discuss in-home help resources.  Follow up Palliative Care Visit: Palliative care will continue to follow for complex medical decision  making, advance care planning, and clarification of goals. Return 2-3 weeks or prn.    This visit was coded based on medical decision making (MDM).  PPS: 60%  HOSPICE ELIGIBILITY/DIAGNOSIS: TBD  Chief Complaint:  [facility] received a referral to follow up with patient for chronic disease management in setting of chronic respiratory failure, hx of CVA with residual deficits and seizure disorder.  Palliative Care is also following to assist with advance directive planning and defining/refining goals of care.  HISTORY OF PRESENT ILLNESS:  GRAINNE KNIGHTS is a 81 y.o. year old female with chronic respiratory failure secondary to bronchiectasis and COPD.  She also has a  hx of CVA with residual deficits and seizure disorder, HTN, atrial fibrillation, carotid artery stenosis.  She has pneumonia with a pleural effusion and pulmonary nodule suspected to be cancer but unable to undergo biopsy of nodule.  She has recently undergone thoracentesis of pneumonia related pleural effusion with pathology showing mesothelial reactive cells and inflammation.  She has a hx of subarachnoid hemorrhage, BPPV and vestibulitis, mild cognitive impairment with memory loss and vascular dementia.  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  No falls. Last seizures with stroke May 2021.  She has left side residual weakness. Appetite is adequate and taking taper dose of Prednisone.  Husband has early dementia.  Eats closer to diabetic diet and is diet controlled, previously had been on Metformin which has since stopped.  Uses WC when pushed by spouse, otherwise uses walker.  On Oxygen 4L [place] with recent exacerbation, normally on 3.5 L.  Has COPD and bronchiectasis, has had thoracentesis with inability to remove all meds.  Has hemorrhoids.  Urge incontinence. Sleeps in recliner, has hospital bed that is not  comfortable. Pt states she wants to manage her conditions at home if at all possible and avoid hospital stays unless absolutely necessary.   History obtained from review of EMR, discussion with daughter [name] and/or Ms. Sendejo.    I reviewed available labs, medications, imaging, studies and related documents from the EMR.  Records reviewed and summarized above.   ROS General: NAD Cardiovascular: denies chest pain, endorses DOE Pulmonary: denies cough, denies increased SOB Abdomen: endorses good appetite, denies constipation, endorses continence of bowel GU: denies dysuria, endorses intermittent urge incontinence of urine MSK:  endorses worse weakness of left side as residual from stroke but also has some right sided weakness and is right handed, no falls reported Psych: Endorses positive mood Heme/lymph/immuno: denies bruises, abnormal bleeding  Physical Exam: Current and past weights: 205 lbs as of 07/04/21 Constitutional: NAD General: frail appearing, WD/obese  CV: Can speak in complete sentences without having to stop to breathe, no cyanosis visible and noted 1+ Bipedal edema Pulmonary: no increased work of breathing or audible wheezing, no cough, 4L [place] O2 PQZ:RAQTM all extremities Neuro:  Reports difficulty with processing information provided Psych: non-anxious affect, A and O x 3   CURRENT PROBLEM LIST:  Patient Active Problem List   Diagnosis Date Noted   Pleural effusion 07/08/2021   Pneumonia 05/07/2021   Bronchiectasis without complication ([place]) 22/63/3354   Pulmonary nodule 1 cm or greater in diameter 12/28/2020   Dysuria 12/18/2020   Leukocytosis 12/18/2020   Leg weakness 10/30/2020   Vascular dementia with behavior disturbance ([place]) 09/06/2020   Permanent atrial fibrillation with rapid ventricular response ([place]) 09/06/2020   Breast cancer screening, high risk patient 10/25/2019   Paroxysmal atrial fibrillation ([place]) 07/11/2019   Carotid artery stenosis  06/20/2019   Stroke ([place]) 06/20/2019   Degenerative arthritis of left foot 06/14/2019   Left ankle sprain 06/14/2019   On home O2 05/24/2019   Acute right arterial ischemic stroke, MCA (middle cerebral artery) ([place]) 05/24/2019   Stenosis of right carotid artery    Atrial fibrillation with RVR ([place]) 05/18/2019   Seizures ([place])    HLD (hyperlipidemia)    Macrocytosis    Acute ischemic right MCA stroke ([place])    Depression    Allergic rhinitis 05/02/2019   At high risk for breast cancer 12/09/2017   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   Obesity, morbid, BMI 40.0-49.9 ([place]) 04/30/2016   Chronic respiratory failure ([place]) 12/11/2015   COPD with acute exacerbation ([place]) 10/08/2015   Genetic testing 08/26/2015   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   Family history of ovarian cancer 07/26/2015   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   Benign paroxysmal positional vertigo 04/06/2014   Tonic-clonic seizure syndrome ([place]) 04/06/2014   Pulsatile tinnitus of right ear 04/06/2014   Other forms of epilepsy and recurrent seizures without mention of intractable epilepsy 04/21/2013   SAH (subarachnoid hemorrhage) ([place]) 04/21/2013   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   Seizure disorder, complex partial, 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[place])    Stroke, hemorrhagic ([place])    HTN (hypertension)    Vestibulitis of ear    PAST MEDICAL HISTORY:  Active Ambulatory Problems    Diagnosis Date Noted  Seizure disorder, complex partial, 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[place])    Stroke, hemorrhagic ([place])    HTN (hypertension)    Vestibulitis of ear    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   Other forms of epilepsy and recurrent seizures without mention of intractable epilepsy 04/21/2013   SAH (subarachnoid hemorrhage) ([place]) 04/21/2013   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   Benign paroxysmal positional vertigo 04/06/2014   Tonic-clonic seizure syndrome ([place]) 04/06/2014   Pulsatile  tinnitus of right ear 04/06/2014   Family history of ovarian cancer 07/26/2015   Genetic testing 08/26/2015   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   COPD with acute exacerbation ([place]) 10/08/2015   Chronic respiratory failure ([place]) 12/11/2015   Obesity, morbid, BMI 40.0-49.9 ([place]) 04/30/2016   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   At high risk for breast cancer 12/09/2017   Allergic rhinitis 05/02/2019   Seizures (HCC)    HLD (hyperlipidemia)    Macrocytosis    Acute ischemic right MCA stroke (HCC)    Depression    Atrial fibrillation with RVR ([place]) 05/18/2019   Stenosis of right carotid artery    On home O2 05/24/2019   Acute right arterial ischemic stroke, MCA (middle cerebral artery) ([place]) 05/24/2019   Degenerative arthritis of left foot 06/14/2019   Left ankle sprain 06/14/2019   Carotid artery stenosis 06/20/2019   Stroke ([place]) 06/20/2019   Paroxysmal atrial fibrillation ([place]) 07/11/2019   Breast cancer screening, high risk patient 10/25/2019   Vascular dementia with behavior disturbance ([place]) 09/06/2020   Permanent atrial fibrillation with rapid ventricular response ([place]) 09/06/2020   Leg weakness 10/30/2020   Dysuria 12/18/2020   Leukocytosis 12/18/2020   Bronchiectasis without complication ([place]) 32/54/9826   Pulmonary nodule 1 cm or greater in diameter 12/28/2020   Pneumonia 05/07/2021   Pleural effusion 07/08/2021   Resolved Ambulatory Problems    Diagnosis Date Noted   Acute sinusitis 11/07/2015   Past Medical History:  Diagnosis Date   Afib ([place])    Arthritis    COPD (chronic obstructive pulmonary disease) ([place])    Diabetes mellitus without complication ([place])    Diverticulosis    Dysrhythmia    Hearing loss    Memory loss    Pre-diabetes    Seasonal depression ([place])    SOCIAL HX:  Social History   Tobacco Use   Smoking status: Former    Packs/day: 1.00    Years: 41.00    Total pack years: 41.00    Types: Cigarettes    Quit  date: 01/13/2006    Years since quitting: 15.5   Smokeless tobacco: Never  Substance Use Topics   Alcohol use: No    Alcohol/week: 0.0 standard drinks of alcohol   FAMILY HX:  Family History  Problem Relation Age of Onset   Dementia Mother    Stroke Mother    Thyroid disease Mother    Osteoporosis Mother    Ovarian cancer Paternal Grandmother 108   Ovarian cancer Paternal Aunt 58   Breast cancer Daughter 35       breast ca x2, bilateral dx. 58, 46--estrogen   Other Daughter        no genetic testing as of 07/2015; has had ovaries removed   Other Daughter        BRCA 2 Pos.   Colon cancer Maternal Aunt        dx. unspecified age   Emphysema Maternal Uncle    High blood pressure Maternal Grandmother  Dementia Maternal Grandfather    Bleeding Disorder Maternal Grandfather    Emphysema Maternal Aunt    Emphysema Maternal Aunt        Preferred Pharmacy: ALLERGIES:  Allergies  Allergen Reactions   Prozac [Fluoxetine Hcl] Other (See Comments)    seizures   Sulfa Antibiotics Nausea And Vomiting and Other (See Comments)    TINGLING IN LEGS ALSO   Micardis [Telmisartan] Swelling and Other (See Comments)    Swelling, bruising Swelling, bruising   Prednisone Other (See Comments)    mood changes Mood changes   Allegra [Fexofenadine]     Makes her sleep 24 hours    Prozac [Fluoxetine]     seizure   Augmentin [Amoxicillin-Pot Clavulanate] Rash   Avelox [Moxifloxacin Hcl In Nacl] Rash    LEVAQUIN IS OK-SEE NOTE   Lisinopril-Hydrochlorothiazide Other (See Comments)    unknown   Norvasc [Amlodipine Besylate] Other (See Comments)    fatigue     PERTINENT MEDICATIONS:  Outpatient Encounter Medications as of 07/15/2021  Medication Sig   acetaminophen (TYLENOL) 325 MG tablet Take 1-2 tablets (325-650 mg total) by mouth every 4 (four) hours as needed for mild pain.   Albuterol Sulfate, sensor, (PROAIR DIGIHALER) 108 (90 Base) MCG/ACT AEPB Inhale 2 puffs into the lungs every 4  (four) hours as needed.   arformoterol (BROVANA) 15 MCG/2ML NEBU Take 2 mLs (15 mcg total) by nebulization 2 (two) times daily.   aspirin EC 81 MG tablet Take 81 mg by mouth daily.   atorvastatin (LIPITOR) 20 MG tablet TAKE 1 TABLET DAILY   budesonide (PULMICORT) 0.5 MG/2ML nebulizer solution Take 2 mLs (0.5 mg total) by nebulization in the morning and at bedtime.   captopril (CAPOTEN) 50 MG tablet Take 50 mg by mouth 2 (two) times daily.   Cholecalciferol (VITAMIN D3) 2000 units capsule Take 2,000 Units by mouth daily.    clonazePAM (KLONOPIN) 2 MG tablet Take 1 tablet at bedtime,  may take additional 0.5 once daily tablet if necessary for panic attack.   diltiazem (CARDIZEM CD) 360 MG 24 hr capsule TAKE 1 CAPSULE DAILY (DOSE INCREASE)   ELIQUIS 5 MG TABS tablet TAKE 1 TABLET TWICE A DAY   famotidine (PEPCID) 20 MG tablet Take 1 tablet (20 mg total) by mouth 2 (two) times daily.   fluticasone (FLONASE) 50 MCG/ACT nasal spray Place 1 spray into the nose daily as needed for allergies. Spray twice daily as needed   gabapentin (NEURONTIN) 600 MG tablet TAKE ONE-HALF (1/2) TABLET AT BEDTIME   guaiFENesin (MUCINEX) 600 MG 12 hr tablet Take 600 mg by mouth 2 (two) times daily.   LEXAPRO 20 MG tablet Take 1 tablet (20 mg total) by mouth daily. Morning dosage   loratadine (CLARITIN) 5 MG chewable tablet Chew 1 tablet (5 mg total) by mouth daily.   memantine (NAMENDA) 5 MG tablet Take 1 tablet (5 mg total) by mouth 2 (two) times daily.   metFORMIN (GLUCOPHAGE) 500 MG tablet Take 500 mg by mouth daily. (Patient not taking: Reported on 07/08/2021)   metoprolol tartrate (LOPRESSOR) 25 MG tablet Take 25 mg by mouth 2 (two) times daily as needed.   MICONAZOLE NITRATE EX Apply topically.   polyethylene glycol (MIRALAX / GLYCOLAX) 17 g packet Take 17 g by mouth daily. Purchase bottle over the counter and use capful daily (Patient taking differently: Take 17 g by mouth daily as needed. Purchase bottle over the  counter and use capful daily)   predniSONE (DELTASONE) 10  MG tablet 4 tabs for 2 days, then 3 tabs for 2 days, 2 tabs for 2 days, then 1 tab for 2 days, then stop   psyllium (METAMUCIL) 28 % packet 1 packet with 8 ounces of liquid as needed   revefenacin (YUPELRI) 175 MCG/3ML nebulizer solution Take 3 mLs (175 mcg total) by nebulization daily.   sodium chloride HYPERTONIC 3 % nebulizer solution Take by nebulization in the morning and at bedtime.   Spacer/Aero-Holding Chambers (AEROCHAMBER PLUS WITH MASK) inhaler As needed with inhaler   vitamin B-12 (CYANOCOBALAMIN) 500 MCG tablet Take 1 tablet (500 mcg total) by mouth daily.   [DISCONTINUED] levETIRAcetam (KEPPRA) 750 MG tablet Take 750 mg by mouth 2 (two) times daily.   No facility-administered encounter medications on file as of 07/15/2021.     --------------------------------------------------------------------------------- Advance Care Planning/Goals of Care: Goals include to maximize quality of life and symptom management. Patient and health care surrogate gave their permission to discuss.Our advance care planning conversation included a discussion about:    The value and importance of advance care planning  Experiences with loved ones who have been seriously ill or have died-pt's mother had Hospice Exploration of personal, cultural or spiritual beliefs that might influence medical decisions-wants to avoid the hospital if possible. Spouse has early dementia Identification  of a healthcare agent-daughter has [facility] POA Review of an advance directive document -MOST and DNR choices, not ready to decide currently.  CODE STATUS: Full code at present I   Thank you for the opportunity to participate in the care of Ms. Vaughn.  The palliative care team will continue to follow. Please call our office at [phone] if we can be of additional assistance.   Marijo Conception, FNP-C  COVID-19 PATIENT SCREENING TOOL Asked and negative response  unless otherwise noted:  Have you had symptoms of covid, tested positive or been in contact with someone with symptoms/positive test in the past 5-10 days?  No

## 2021-07-18 NOTE — Telephone Encounter (Signed)
Left message for patient to call back. Patient is scheduled for CT scan tomorrow.

## 2021-07-19 ENCOUNTER — Ambulatory Visit (HOSPITAL_COMMUNITY)
Admission: RE | Admit: 2021-07-19 | Discharge: 2021-07-19 | Disposition: A | Discharge status: Home / Self Care | Payer: Medicare Other | Source: Ambulatory Visit | Attending: Nurse Practitioner | Admitting: Nurse Practitioner

## 2021-07-19 DIAGNOSIS — R918 Other nonspecific abnormal finding of lung field: Secondary | ICD-10-CM | POA: Diagnosis not present

## 2021-07-19 DIAGNOSIS — J439 Emphysema, unspecified: Secondary | ICD-10-CM | POA: Diagnosis not present

## 2021-07-19 DIAGNOSIS — J90 Pleural effusion, not elsewhere classified: Secondary | ICD-10-CM | POA: Insufficient documentation

## 2021-07-19 MED ORDER — YUPELRI 175 MCG/3ML IN SOLN: 175.0000 ug | Freq: Every day | RESPIRATORY_TRACT | 0 refills | Status: DC

## 2021-07-19 NOTE — Telephone Encounter (Signed)
Beth [name] please advise on the following My Chart message:   [name] Pool (supporting [name], NP) 44 minutes ago (2:18 PM)    Mom received the [place] nebulizer yesterday (Thursday, July 6).  She has not received the others.  I went on express scripts website and this is what was stated:   Budesonide Inh Susp 30s - "We are sorry but this medication is not availiable for delivery at this time.  Contact your doctor for more information or to transfer this medication."   Yupelri Inh Soln 10m 1's - "Before we fill your prescription, we need to make sure your plan covers this medication."   What do we need to do from here?  Does mom start the BAvalonwithout the others?  If so, does she stop her Advair?   Thanks,    WLenon [phone]

## 2021-07-19 NOTE — Telephone Encounter (Signed)
Please provide her with Yupelri sample x2 until they can have this filled. They can pick it up today if they can get here before 5 or Monday.   Budesonide has been on backorder for a lot of the mail in pharmacies, from what I have recently been told, so determine what local pharmacy she would like [place] to try to send it into. Thanks.

## 2021-07-22 ENCOUNTER — Ambulatory Visit: Payer: Medicare Other | Admitting: Nurse Practitioner

## 2021-07-25 ENCOUNTER — Ambulatory Visit (INDEPENDENT_AMBULATORY_CARE_PROVIDER_SITE_OTHER): Payer: Medicare Other | Admitting: Pulmonary Disease

## 2021-07-25 ENCOUNTER — Encounter: Payer: Self-pay | Admitting: Pulmonary Disease

## 2021-07-25 DIAGNOSIS — J441 Chronic obstructive pulmonary disease with (acute) exacerbation: Secondary | ICD-10-CM

## 2021-07-25 DIAGNOSIS — I6521 Occlusion and stenosis of right carotid artery: Secondary | ICD-10-CM

## 2021-07-25 DIAGNOSIS — R911 Solitary pulmonary nodule: Secondary | ICD-10-CM | POA: Diagnosis not present

## 2021-07-25 DIAGNOSIS — J90 Pleural effusion, not elsewhere classified: Secondary | ICD-10-CM | POA: Diagnosis not present

## 2021-07-25 DIAGNOSIS — J9611 Chronic respiratory failure with hypoxia: Secondary | ICD-10-CM

## 2021-07-25 NOTE — Patient Instructions (Addendum)
  X refills on pro-air  -clarified budesonide/ brovana & yupelri regimen Stay on advair + yupelri until you get the others  X Rx for yupelri to be sent to specialty pharmacy

## 2021-07-25 NOTE — Progress Notes (Signed)
   Subjective:    Patient ID: Beth [name], female    DOB: 1940/11/10, 81 y.o.   MRN: [id number]  HPI 81 yo former smoker for FU of COPD & chronic respiratory failure  -Enlarging right lower lobe nodule since 2020   PMH :  HTN, organic brain syndrome, stroke, BRCA2 positive, Obesity. 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  Subsequent seizures. Has some word finding issues and cognitive deficits with emotional lability   -BRCA positive after her daughter was diagnosed with breast cancer   2-week follow-up visit Arrives in a wheelchair on oxygen, accompanied by her husband and daughter Beth [name] who corroborates history. She was treated in February for right lower lobe consolidation. She developed left lower lobe consolidation 05/2021 and received another round of Levaquin , then developed a small effusion. 6/6 150 cc of thoracentesis performed , predominantly lymphocytic exudate, cytology negative  She was seen by APP 6/28 and complained of shortness of breath, Advair was changed to budesonide Garlon Hatchet and Yupelri.  She has not been able to obtain Yupelri or budesonide.  She has received Brovana but has not started yet.  She remains on Advair. Breathing is somewhat stabilized  I reviewed CT chest which shows that the left lower lobe consolidation has resolved, small effusion persists       Significant tests/ events reviewed  CT chest without con 07/2021 unchanged right lower lobe 1.4 cm nodule,, small left effusion, left lower lobe nodule not noted, left lower lobe consolidation resolved, evidence of old calcified granulomas   CT chest 05/2021 right lower lobe consolidation overall, right lower lobe nodule unchanged, left lower lobe consolidation/small effusion, left lower lobe nodule not noted   02/2021 PET >> right lower lobe consolidation , right lower lobe nodule not visualized, left lower lobe  1.1 cm nodule hypermetabolic, mediastinal lymph nodes hypermetabolic 08/7562 HRCT was obtained on 02/20/2021 which showed that right lower lobe consolidation had resolved with minimal scarring, no ILD was noted, there was 1 enlarging 1.4 x 1.2 cm nodule.   12/2020 CT chest without contrast 12/9 - bronchiectasis at the bases likely related to mucoid impaction   12/2018 CT chest Scattered pulmonary nodules are seen in the right lower lobe, the largest measuring up to 1.1 cm in diameter.      CT angio chest 07/2015 >> no pulmonary embolism, no emphysema, calcified mediastinal LNs PFTs 09/2015>> severe airway obstruction with FEV1 40%, improves to 47% with albuterol, DLCO decreased to 45%   Review of Systems neg for any significant sore throat, dysphagia, itching, sneezing, nasal congestion or excess/ purulent secretions, fever, chills, sweats, unintended wt loss, pleuritic or exertional cp, hempoptysis, orthopnea pnd or change in chronic leg swelling. Also denies presyncope, palpitations, heartburn, abdominal pain, nausea, vomiting, diarrhea or change in bowel or urinary habits, dysuria,hematuria, rash, arthralgias, visual complaints, headache, numbness weakness or ataxia.     Objective:   Physical Exam  Gen. Pleasant, obese, in no distress ENT - no lesions, no post nasal drip Neck: No JVD, no thyromegaly, no carotid bruits Lungs: no use of accessory muscles, no dullness to percussion, decreased left base without rales or rhonchi  Cardiovascular: Rhythm regular, heart sounds  normal, no murmurs or gallops, no peripheral edema Musculoskeletal: No deformities, no cyanosis or clubbing , no tremors       Assessment & Plan:

## 2021-07-26 NOTE — Assessment & Plan Note (Signed)
I was worried about a malignant pleural effusion.  I am pleased that cytology was negative and the effusion is now small and left lower lobe consolidation/atelectasis is resolved. I am not sure why she developed pneumonia in a different lobe within 3 months but I am glad she is improved.

## 2021-07-26 NOTE — Assessment & Plan Note (Signed)
Continue oxygen at all times 

## 2021-07-26 NOTE — Assessment & Plan Note (Signed)
At her last visit, it was felt that inspiratory strength is not enough and she was switched from Advair to nebulized medications. Unfortunately she has not been able to obtain either budesonide or Yupelri. I have asked her to continue on Advair for now until she gets budesonide.  We will send prescription to specialized pharmacy for [place].  Refills to be provided on ProAir

## 2021-07-26 NOTE — Assessment & Plan Note (Signed)
I remain concerned about the right lower lobe 1.4 cm nodule.  Although it is stable in size this year it has increased from 1.1 cm to 1.4 cm over the last 3 years.  She does have stigmata of old granulomatous disease it is possible that this is a granuloma.  Unfortunately PET scan was not helpful due to overlying consolidation Regardless, she is not a candidate for biopsy We will continue to follow this nodule every 6 months

## 2021-07-30 ENCOUNTER — Telehealth: Payer: Self-pay

## 2021-07-30 ENCOUNTER — Other Ambulatory Visit (HOSPITAL_COMMUNITY): Payer: Self-pay

## 2021-07-30 NOTE — Telephone Encounter (Signed)
Patient Advocate Encounter  Prior Authorization for [place] Be[facility] has been approved.    KeySeward Grater PA Case ID: [id number] Effective dates: 06/30/2021 through 01/12/2098  Clista Bernhardt, CPhT Pharmacy Patient Advocate Specialist [facility] Patient Advocate Team Phone: [phone]   Fax: [phone]

## 2021-07-30 NOTE — Telephone Encounter (Signed)
Received Prior Auth request for [place]  from [facility]. They are requesting patient to try their preferred alternative(s)-ADVAIR, STIOLTO. Justification for use of non-formulary medication is required.   Appropriate reasons are contraindications to the formulary agent, adverse reactions to the formulary agent, therapeutic failure of all formulary alternatives, no formulary alternatives exist, or serious risk is associated with a changed to a formulary agent.   Patient chart indicates that this patient has try/failed Advair due to low inspiratory strength causing ineffective therapy, and Stiolto due to associated risk (patient is unable to use/self administer)  Prior authorization started in [employer] Diagnosis:  J44.1 - Chronic obstructive pulmonary disease with (acute) exacerbation J96.10 - Chronic respiratory failure, unspecified whether with hypoxia or hypercapnia Key: [id number] Submitted: 07/30/21  Clista Bernhardt, CPhT Pharmacy Patient Advocate Specialist [facility] Patient Advocate Team Phone: [phone]   Fax: [phone]

## 2021-08-08 ENCOUNTER — Encounter: Payer: Self-pay | Admitting: Family Medicine

## 2021-08-08 ENCOUNTER — Other Ambulatory Visit: Payer: Medicare Other | Admitting: Family Medicine

## 2021-08-08 VITALS — HR 59 | Resp 20

## 2021-08-08 DIAGNOSIS — Z515 Encounter for palliative care: Secondary | ICD-10-CM

## 2021-08-08 DIAGNOSIS — J90 Pleural effusion, not elsewhere classified: Secondary | ICD-10-CM | POA: Diagnosis not present

## 2021-08-08 DIAGNOSIS — R0989 Other specified symptoms and signs involving the circulatory and respiratory systems: Secondary | ICD-10-CM | POA: Diagnosis not present

## 2021-08-08 DIAGNOSIS — Z636 Dependent relative needing care at home: Secondary | ICD-10-CM | POA: Diagnosis not present

## 2021-08-08 DIAGNOSIS — J9611 Chronic respiratory failure with hypoxia: Secondary | ICD-10-CM

## 2021-08-08 NOTE — Progress Notes (Signed)
[facility] Consult Note Telephone: [phone]  Fax: [phone]    Date of encounter: 08/08/21 2:41 PM PATIENT NAME: Beth [name] [address] [name] [address]   [phone] (home)  DOB: [birth year] MRN: [id number] PRIMARY CARE PROVIDER:    Kathyrn Lass, MD,  [place] [address] [phone]  REFERRING PROVIDER:   Kathyrn Lass, [place],  [place] [address] [phone]  RESPONSIBLE PARTY:    Contact Information     Name Relation Home Work [place]   Lindsy, Cerullo Daughter   [phone]   Kaylinn, Dedic   [phone]        I met face to face with patient and family in her home. Palliative Care was asked to follow this patient by consultation request of  Kathyrn Lass, MD to address advance care planning and complex medical decision making. This is a follow up visit.   ASSESSMENT, SYMPTOM MANAGEMENT AND PLAN / RECOMMENDATIONS:   Chronic respiratory failure with COPD/Bronchiectasis and  Pleural Effusion Improved pleural effusion, O2 sats stable on O2. Continue Yupelri with hypertonic saline nebs Start Brovana/Budesonide nebs in place of Advair when Budesonide received. Pleuracentesis negative for malignant cells. Advised that pleural effusions can recur and educated to monitor for acute worsening SOB/DOE and activity intolerance.  Globus Sensation Discussed chin tuck maneuver in addition to  mixing with pureed substance to improve swallow. Notify provider for coughing/choking after eating or drinking  Caregiver burden Looking for in home assistance since pt requires assistance and spouse has dementia. SW referral to identify home resources.  Palliative Care Encounter Discussion of goals of care. Pt wants to review MOST and discuss with family prior to completion but states she does not want CPR or intubation.  Advance Care Planning/Goals of Care: Goals  include to maximize quality of life and symptom management. Patient/health care surrogate gave their permission to discuss. Our advance care planning conversation included a discussion about:    The value and importance of advance care planning-has a living trust set up Exploration of personal, cultural or spiritual beliefs that might influence medical decisions  Exploration of goals of care in the event of a sudden injury or illness-pt shook her head no to CPR and intubation but was not ready to complete MOST Identification of a healthcare agent-spouse who has dementia followed by daughter [name] Review of an advance directive document-MOST, copy left with family. Advised she can fill it out but has to wait to sign it before a provider witness.  CODE STATUS: Last code status from 2021 was full code.    Follow up Palliative Care Visit: Palliative care will continue to follow for complex medical decision making, advance care planning, and clarification of goals. Return 4 weeks or prn.    This visit was coded based on medical decision making (MDM).  PPS: 50%  HOSPICE ELIGIBILITY/DIAGNOSIS: TBD  Chief Complaint:  [facility] received a referral to follow up with patient for chronic disease management in setting of vascular dementia with hx of stroke and residual effects on left side. Palliative Care is also following to assist with advance directive planning and defining/refining goals of care.  HISTORY OF PRESENT ILLNESS:  Beth [name] is a 81 y.o. year old female with vascular dementia, hx of SAH/strokes with residual left sided deficits, tonic-clonic seizures, carotid artery stenosis, PAF, chronic respiratory failure related to COPD and bronchiectasis with recent pleural effusion on left and  pulmonary nodules, pulsatile tinnitus, vestibulitis, family hx of ovarian CA with BRCA2 positive mutation and DM. Endorses SOB and DOE at baseline on O2 @ 4L [place]. DM currently  diet controlled, has some residual left sided weakness/loss of control over sensation in left hand and grip strength.  Reports random sharp pains in palm of her hand, arch of her foot.  She cannot open cans, button buttons and is assisted with bathing and dressing by daughter.  She has not yet started on her Budesonide/Brovana combo inhalers as she has only received the [place].  She is taking Yupelri  neb with hypertonic nebulized saline and with Albuterol MDI for rescue. Has completed Prednisone course. Appetite is good, no falls, intermittent incontinence of bladder.  Occasional feeling of food getting "stuck", has seen SLP and uses applesauce to improve swallow.  She reports with exertion her heart rate goes up and she can sometimes feel palpitations which cause her to be more SOB.  History obtained from review of EMR, discussion with family and/or Ms. Trombly.    I reviewed EMR with no new available labs, medications, imaging, studies or related documents since last assessment.    ROS  General: NAD ENMT: endorses some globus sensation Cardiovascular: denies chest pain, endorses DOE, no PND Pulmonary: denies cough, SOB @ baseline, no orthopnea Abdomen: endorses good appetite, denies constipation, endorses continence of bowel GU: denies dysuria, endorses intermittent incontinence of urine MSK:  endorses left sided weakness but is able to move, has limited control of grip strength, no pressure sensation in left hand but can detect temperature, no falls reported Skin: denies rashes or wounds Neurological: random sharp, shooting pain on left, denies insomnia Psych: Endorses positive mood Heme/lymph/immuno: denies bruises, abnormal bleeding  Physical Exam: Current and past weights: 205 lbs 12.8 ounces as of 07/25/21 Constitutional: NAD General: WN, chronically ill and obese  CV: S1S2, IRIR, trace BLE pedal edema Pulmonary: CTAB diminished on left side, greatest in left base, no increased work  of breathing, no cough, on O2@ 4L Abdomen: normo-active BS + 4 quadrants, soft and non tender MSK: no sarcopenia, moves all extremities, ambulatory with rolling walker Skin: warm and dry, no rashes or wounds on visible skin Neuro:  noted left sided weakness, no cognitive impairment Psych: moderately anxious affect, A and O x 3 Hem/lymph/immuno: no widespread bruising   Thank you for the opportunity to participate in the care of Ms. Siddique.  The palliative care team will continue to follow. Please call our office at [phone] if we can be of additional assistance.   Marijo Conception, FNP -C  COVID-19 PATIENT SCREENING TOOL Asked and negative response unless otherwise noted:   Have you had symptoms of covid, tested positive or been in contact with someone with symptoms/positive test in the past 5-10 days?  no

## 2021-08-19 ENCOUNTER — Other Ambulatory Visit: Payer: Self-pay | Admitting: Interventional Cardiology

## 2021-08-19 DIAGNOSIS — I4891 Unspecified atrial fibrillation: Secondary | ICD-10-CM

## 2021-08-19 NOTE — Telephone Encounter (Signed)
Eliquis '5mg'$  refill request received. Patient is 81 years old, weight-93.4kg, Crea-0.80 on 02/19/2021, Diagnosis-Afib, and last seen by Dr. Irish Lack on 01/17/2021. Dose is appropriate based on dosing criteria. Will send in refill to requested pharmacy.

## 2021-09-02 DIAGNOSIS — E1169 Type 2 diabetes mellitus with other specified complication: Secondary | ICD-10-CM | POA: Diagnosis not present

## 2021-09-02 DIAGNOSIS — K219 Gastro-esophageal reflux disease without esophagitis: Secondary | ICD-10-CM | POA: Diagnosis not present

## 2021-09-02 DIAGNOSIS — Z0000 Encounter for general adult medical examination without abnormal findings: Secondary | ICD-10-CM | POA: Diagnosis not present

## 2021-09-02 DIAGNOSIS — Z6836 Body mass index (BMI) 36.0-36.9, adult: Secondary | ICD-10-CM | POA: Diagnosis not present

## 2021-09-02 DIAGNOSIS — Z1389 Encounter for screening for other disorder: Secondary | ICD-10-CM | POA: Diagnosis not present

## 2021-09-02 DIAGNOSIS — R799 Abnormal finding of blood chemistry, unspecified: Secondary | ICD-10-CM | POA: Diagnosis not present

## 2021-09-02 DIAGNOSIS — I10 Essential (primary) hypertension: Secondary | ICD-10-CM | POA: Diagnosis not present

## 2021-09-02 DIAGNOSIS — F3341 Major depressive disorder, recurrent, in partial remission: Secondary | ICD-10-CM | POA: Diagnosis not present

## 2021-09-06 ENCOUNTER — Other Ambulatory Visit: Payer: Medicare Other | Admitting: Family Medicine

## 2021-09-06 VITALS — HR 64

## 2021-09-06 DIAGNOSIS — F01518 Vascular dementia, unspecified severity, with other behavioral disturbance: Secondary | ICD-10-CM | POA: Diagnosis not present

## 2021-09-06 DIAGNOSIS — Z636 Dependent relative needing care at home: Secondary | ICD-10-CM | POA: Diagnosis not present

## 2021-09-06 DIAGNOSIS — Z515 Encounter for palliative care: Secondary | ICD-10-CM | POA: Diagnosis not present

## 2021-09-06 NOTE — Progress Notes (Unsigned)
[facility] Consult Note Telephone: [phone]  Fax: [phone]    Date of encounter: 09/06/21 2:58 PM PATIENT NAME: Beth [name] [address] [place] [address]   [phone] (home)  DOB: 1940/09/28 MRN: [id number] PRIMARY CARE PROVIDER:    Kathyrn Lass, MD,  [place] [address] [phone]  REFERRING PROVIDER:   Kathyrn Lass, [place],  [place] [address] [phone]  RESPONSIBLE PARTY:    Contact Information     Name Relation Home Work [place]   Layney, Gillson Daughter   [phone]   Ellan, Tess   [phone]        I met face to face with patient and spouse in their home. Palliative Care was asked to follow this patient by consultation request of  Kathyrn Lass, MD to address advance care planning and complex medical decision making. This is a follow up visit.   ASSESSMENT, SYMPTOM MANAGEMENT AND PLAN / RECOMMENDATIONS:  Vascular dementia with behavioral disturbance FAST 7 score 4 Need to discuss options with pt and family as both pt/spouse have significant debility from chronic illness and dementia. SW referral to discuss options with wife and spouse options for future care   Palliative Care Encounter Need to address code status wishes and goals of care  3.   Caregiver burden Spent time talking separately with both pt and daughter about approaching pt/spouse by giving choice, "asking permission".   Encouraged a cooling off period for both pt and daughter to let go of their anger so they can talk. Advised daughter that by giving choices and asking permission she is still showing outward respect to her parents allowing them to feel like they still have some control over what happens. Advised pt that the daughter is most likely "not trying to take over" but to help as best as she can to meet their needs and sometimes it is in our approach to helping people  by asking consent or getting permission that we respect their ability to choose allowing for more meaningful interaction. SW referral to address resources for in home private pay help versus possible ALF to meet likely increasing caregiving needs for pt and spouse.    Follow up Palliative Care Visit: Palliative care will continue to follow for complex medical decision making, advance care planning, and clarification of goals. Return 4 weeks or prn.    This visit was coded based on medical decision making (MDM).  PPS: 50%  HOSPICE ELIGIBILITY/DIAGNOSIS: TBD  Chief Complaint:  Palliative Care is following patient for chronic medical management in setting of dementia.  HISTORY OF PRESENT ILLNESS:  Beth [name] is a 81 y.o. year old female with vascular dementia, hx of SAH/strokes with residual left sided deficits, tonic-clonic seizures, carotid artery stenosis, PAF, chronic respiratory failure related to COPD and bronchiectasis with recent pleural effusion on left and pulmonary nodules, pulsatile tinnitus, vestibulitis, family hx of ovarian CA with BRCA2 positive mutation and DM.  Had her pneumonia vaccine recently in left arm and has some soreness. More SOB than baseline and has had clear runny nose.  No CP. Denies fever, nausea/vomiting or constipation. Has appetite. No falls.  Using Wyxela inhaler daily and using sodium chloride in nebulizer. She and her daughter Beth [name] have had a heated argument and are not spending time together currently.  She and her husband have given POA to [place] as they both have dementia and varying degrees of disability or  need for help.  Pt is no longer able to stand long enough to cook dinner.  Beth [name] has fixed sandwiches for her dad, offered to do grocery shopping for them and has told patient at times she has spent too much money.  Patient states she feels like Beth [name] is trying to take on too much power over them and not respecting their choices.  She is attempting to  locate someone she can pay to come in and help with home chores and housekeeping/errands, help with prepping meals.  Most of the time currently her spouse is going out and purchasing their dinner.  She wants for both she and Beth [name] to have counseling to try and repair their relationship.  Both she and Beth [name] have acknowledged to this provider that they were very angry and said things they shouldn't have said. She says she has a new inhaler Wyxela she uses BID.   History obtained from review of EMR, discussion with (daughter Beth [name] by phone prior to visit) spouse and/or Ms. [name].    I reviewed EMR and there are no new available labs, imaging, studies or related documents since her last visit.    ROS General: NAD ENMT: denies dysphagia, nasal rhinorrhea is clear Cardiovascular: denies chest pain, endorses DOE Pulmonary: denies cough, endorses increased SOB responsive to inhaler Abdomen: endorses good appetite, denies constipation, endorses continence of bowel GU: denies dysuria, endorses intermittent incontinence of urine MSK:  denies increased weakness,  no falls reported Skin: denies rashes or wounds Neurological: denies pain, denies insomnia Psych: Endorses positive mood   Physical Exam: Constitutional: NAD General: WD, obese  ENMT: intact hearing, oral mucous membranes moist, dentition intact CV: S1S2, IRIR, trace BLE non-pitting edema Pulmonary: CTAB, no increased work of breathing, no cough, On O2 @ 4L [place] Abdomen: normo-active BS + 4 quadrants, soft and non tender GU: deferred MSK: no sarcopenia, moves all extremities, ambulatory for short distances with rolling walker Skin: warm and dry, no rashes or wounds on visible skin Neuro:  no generalized weakness,  noted short term memory deficits Psych: non-anxious affect, A and O x 3    Thank you for the opportunity to participate in the care of Beth [name].  The palliative care team will continue to follow. Please call our office at  [phone] if we can be of additional assistance.   Marijo Conception, FNP -C  COVID-19 PATIENT SCREENING TOOL Asked and negative response unless otherwise noted:   Have you had symptoms of covid, tested positive or been in contact with someone with symptoms/positive test in the past 5-10 days?  no

## 2021-09-08 ENCOUNTER — Encounter: Payer: Self-pay | Admitting: Family Medicine

## 2021-09-09 DIAGNOSIS — D225 Melanocytic nevi of trunk: Secondary | ICD-10-CM | POA: Diagnosis not present

## 2021-09-09 DIAGNOSIS — L918 Other hypertrophic disorders of the skin: Secondary | ICD-10-CM | POA: Diagnosis not present

## 2021-09-09 DIAGNOSIS — L603 Nail dystrophy: Secondary | ICD-10-CM | POA: Diagnosis not present

## 2021-09-09 DIAGNOSIS — L853 Xerosis cutis: Secondary | ICD-10-CM | POA: Diagnosis not present

## 2021-09-09 DIAGNOSIS — D2262 Melanocytic nevi of left upper limb, including shoulder: Secondary | ICD-10-CM | POA: Diagnosis not present

## 2021-09-09 DIAGNOSIS — D692 Other nonthrombocytopenic purpura: Secondary | ICD-10-CM | POA: Diagnosis not present

## 2021-09-09 DIAGNOSIS — L720 Epidermal cyst: Secondary | ICD-10-CM | POA: Diagnosis not present

## 2021-09-09 DIAGNOSIS — L821 Other seborrheic keratosis: Secondary | ICD-10-CM | POA: Diagnosis not present

## 2021-09-09 DIAGNOSIS — D2261 Melanocytic nevi of right upper limb, including shoulder: Secondary | ICD-10-CM | POA: Diagnosis not present

## 2021-10-04 DIAGNOSIS — D649 Anemia, unspecified: Secondary | ICD-10-CM | POA: Diagnosis not present

## 2021-10-30 ENCOUNTER — Other Ambulatory Visit (HOSPITAL_COMMUNITY): Payer: Self-pay | Admitting: Internal Medicine

## 2021-10-30 DIAGNOSIS — K769 Liver disease, unspecified: Secondary | ICD-10-CM | POA: Diagnosis not present

## 2021-10-30 DIAGNOSIS — D6489 Other specified anemias: Secondary | ICD-10-CM | POA: Diagnosis not present

## 2021-11-04 ENCOUNTER — Other Ambulatory Visit: Payer: Self-pay | Admitting: Neurology

## 2021-11-04 DIAGNOSIS — Z23 Encounter for immunization: Secondary | ICD-10-CM | POA: Diagnosis not present

## 2021-11-04 DIAGNOSIS — D649 Anemia, unspecified: Secondary | ICD-10-CM | POA: Diagnosis not present

## 2021-11-05 ENCOUNTER — Ambulatory Visit: Payer: Medicare Other | Admitting: Pulmonary Disease

## 2021-11-06 ENCOUNTER — Encounter: Payer: Self-pay | Admitting: Pulmonary Disease

## 2021-11-06 ENCOUNTER — Ambulatory Visit (INDEPENDENT_AMBULATORY_CARE_PROVIDER_SITE_OTHER): Payer: Medicare Other | Admitting: Pulmonary Disease

## 2021-11-06 DIAGNOSIS — R911 Solitary pulmonary nodule: Secondary | ICD-10-CM

## 2021-11-06 DIAGNOSIS — J9611 Chronic respiratory failure with hypoxia: Secondary | ICD-10-CM | POA: Diagnosis not present

## 2021-11-06 DIAGNOSIS — I6521 Occlusion and stenosis of right carotid artery: Secondary | ICD-10-CM

## 2021-11-06 DIAGNOSIS — J441 Chronic obstructive pulmonary disease with (acute) exacerbation: Secondary | ICD-10-CM | POA: Diagnosis not present

## 2021-11-06 DIAGNOSIS — I2781 Cor pulmonale (chronic): Secondary | ICD-10-CM | POA: Diagnosis not present

## 2021-11-06 MED ORDER — FUROSEMIDE 40 MG PO TABS: 40.0000 mg | ORAL_TABLET | Freq: Every day | ORAL | 0 refills | Status: DC

## 2021-11-06 MED ORDER — POTASSIUM CHLORIDE CRYS ER 10 MEQ PO TBCR: 10.0000 meq | EXTENDED_RELEASE_TABLET | Freq: Every day | ORAL | 0 refills | Status: DC

## 2021-11-06 NOTE — Assessment & Plan Note (Signed)
Although bipedal edema could be related to Cardizem, I feel it is more likely that she has cor pulmonale due to severe hypoxia.  We will diuresis Lasix and explained to her to maintain saturation above 90%  Reassess after diuresis in 10 days. Supplement potassium in anticipation

## 2021-11-06 NOTE — Patient Instructions (Signed)
X Rx for lasix 40 mg daily x 10  Potassium 10 meq daily x 10   X RX to [facility] to assess for POC

## 2021-11-06 NOTE — Progress Notes (Signed)
Subjective:    Patient ID: Beth [name], female    DOB: 1940-11-21, 81 y.o.   MRN: [id number]  HPI  81 yo former smoker for FU of COPD & chronic respiratory failure  -Enlarging right lower lobe nodule since 2020   PMH :  HTN, organic brain syndrome, stroke, BRCA2 positive, Obesity. 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  Subsequent seizures. Has some word finding issues and cognitive deficits with emotional lability   -BRCA positive after her daughter was diagnosed with breast cancer   She was treated in February for right lower lobe consolidation. She developed left lower lobe consolidation 05/2021 and received another round of Levaquin , then developed a small effusion. 6/6 150 cc of thoracentesis performed , predominantly lymphocytic exudate, cytology negative   She was seen by APP 6/28 and complained of shortness of breath, Advair was changed to budesonide [place]on Hatchet and Yupelri.  We finally received preauthorization for Yupelri and she has received this.  She never got budesonide or Brovana and remains on Advair.  Reviewed palliative care encounter>> does not want CPR or intubation, MOLST form to be filled out Her oxygen had run out today and saturation was 74% on room air.  She requests portable concentrator and is willing to pay out-of-pocket, specifically requesting [facility] She had drop in her hemoglobin and saw GI,MRI abdomen is scheduled  She continues to complain of shortness of breath, and is in a wheelchair accompanied by her daughter and husband to corroborate history, she is able to walk around the house, complains of increased bipedal edema for the past few weeks  Significant tests/ events reviewed CT chest without con 07/2021 unchanged right lower lobe 1.4 cm nodule,, small left effusion, left lower lobe nodule not noted, left lower lobe consolidation resolved, evidence of old  calcified granulomas   CT chest 05/2021 right lower lobe consolidation overall, right lower lobe nodule unchanged, left lower lobe consolidation/small effusion, left lower lobe nodule not noted   02/2021 PET >> right lower lobe consolidation , right lower lobe nodule not visualized, left lower lobe 1.1 cm nodule hypermetabolic, mediastinal lymph nodes hypermetabolic 04/979 HRCT was obtained on 02/20/2021 which showed that right lower lobe consolidation had resolved with minimal scarring, no ILD was noted, there was 1 enlarging 1.4 x 1.2 cm nodule.   12/2020 CT chest without contrast 12/9 - bronchiectasis at the bases likely related to mucoid impaction   12/2018 CT chest Scattered pulmonary nodules are seen in the right lower lobe, the largest measuring up to 1.1 cm in diameter.      CT angio chest 07/2015 >> no pulmonary embolism, no emphysema, calcified mediastinal LNs PFTs 09/2015>> severe airway obstruction with FEV1 40%, improves to 47% with albuterol, DLCO decreased to 45%   Review of Systems neg for any significant sore throat, dysphagia, itching, sneezing, nasal congestion or excess/ purulent secretions, fever, chills, sweats, unintended wt loss, pleuritic or exertional cp, hempoptysis, orthopnea pnd or change in chronic leg swelling. Also denies presyncope, palpitations, heartburn, abdominal pain, nausea, vomiting, diarrhea or change in bowel or urinary habits, dysuria,hematuria, rash, arthralgias, visual complaints, headache, numbness weakness or ataxia.     Objective:   Physical Exam  Gen. Pleasant, obese, in no distress ENT - no lesions, no post nasal drip Neck: No JVD, no thyromegaly, no carotid bruits Lungs: no use of accessory muscles, no dullness to percussion, decreased  without rales or rhonchi  Cardiovascular: Rhythm regular, heart sounds  normal, no murmurs or gallops, 2+ peripheral edema Musculoskeletal: No deformities, no cyanosis or clubbing , no tremors        Assessment & Plan:

## 2021-11-06 NOTE — Addendum Note (Signed)
Addended by: Vanessa Barbara on: 11/06/2021 05:34 PM   Modules accepted: Orders

## 2021-11-06 NOTE — Assessment & Plan Note (Signed)
We will plan 42-monthfollow-up CT chest in January

## 2021-11-06 NOTE — Assessment & Plan Note (Signed)
She needs 2 to 4 L of oxygen continuous, previously pulse ox has not worked for her , and so I explained to her that [facility] POC would likely not work. But we will place referral for them to evaluate

## 2021-11-06 NOTE — Assessment & Plan Note (Signed)
Continue Advair and Yupelri combination with albuterol for breakthrough. We discussed COPD action plan and signs and symptoms of COPD exacerbation

## 2021-11-07 ENCOUNTER — Telehealth: Payer: Self-pay | Admitting: Pulmonary Disease

## 2021-11-07 MED ORDER — POTASSIUM CHLORIDE CRYS ER 10 MEQ PO TBCR: 10.0000 meq | EXTENDED_RELEASE_TABLET | Freq: Every day | ORAL | 0 refills | Status: DC

## 2021-11-07 MED ORDER — FUROSEMIDE 40 MG PO TABS: 40.0000 mg | ORAL_TABLET | Freq: Every day | ORAL | 0 refills | Status: DC

## 2021-11-07 NOTE — Telephone Encounter (Signed)
Called and spoke with pt's daughter [name] who states the Rx after pt's OV were sent to mail order pharmacy, not local pharmacy. I have sent meds to preferred pharmacy for pt. Nothing further needed.

## 2021-11-12 ENCOUNTER — Ambulatory Visit (HOSPITAL_COMMUNITY)
Admission: RE | Admit: 2021-11-12 | Discharge: 2021-11-12 | Disposition: A | Discharge status: Home / Self Care | Payer: Medicare Other | Source: Ambulatory Visit | Attending: Internal Medicine | Admitting: Internal Medicine

## 2021-11-12 DIAGNOSIS — N2889 Other specified disorders of kidney and ureter: Secondary | ICD-10-CM | POA: Diagnosis not present

## 2021-11-12 DIAGNOSIS — K828 Other specified diseases of gallbladder: Secondary | ICD-10-CM | POA: Diagnosis not present

## 2021-11-12 DIAGNOSIS — K8020 Calculus of gallbladder without cholecystitis without obstruction: Secondary | ICD-10-CM | POA: Diagnosis not present

## 2021-11-12 DIAGNOSIS — K769 Liver disease, unspecified: Secondary | ICD-10-CM | POA: Diagnosis not present

## 2021-11-12 DIAGNOSIS — N281 Cyst of kidney, acquired: Secondary | ICD-10-CM | POA: Diagnosis not present

## 2021-11-12 MED ORDER — GADOBUTROL 1 MMOL/ML IV SOLN
10.0000 mL | Freq: Once | INTRAVENOUS | Status: AC | PRN
  Administered 2021-11-12: 10 mL via INTRAVENOUS

## 2021-11-21 ENCOUNTER — Ambulatory Visit: Payer: Medicare Other | Admitting: Adult Health

## 2021-11-25 ENCOUNTER — Emergency Department (HOSPITAL_COMMUNITY)
Admission: EM | Admit: 2021-11-25 | Discharge: 2021-11-26 | Discharge status: Against Medical Advice | Payer: Medicare Other | Attending: Emergency Medicine | Admitting: Emergency Medicine

## 2021-11-25 ENCOUNTER — Emergency Department (HOSPITAL_COMMUNITY): Payer: Medicare Other

## 2021-11-25 ENCOUNTER — Other Ambulatory Visit: Payer: Self-pay

## 2021-11-25 DIAGNOSIS — J449 Chronic obstructive pulmonary disease, unspecified: Secondary | ICD-10-CM | POA: Insufficient documentation

## 2021-11-25 DIAGNOSIS — R531 Weakness: Secondary | ICD-10-CM | POA: Diagnosis not present

## 2021-11-25 DIAGNOSIS — Z5321 Procedure and treatment not carried out due to patient leaving prior to being seen by health care provider: Secondary | ICD-10-CM | POA: Insufficient documentation

## 2021-11-25 DIAGNOSIS — R0602 Shortness of breath: Secondary | ICD-10-CM | POA: Insufficient documentation

## 2021-11-25 DIAGNOSIS — R059 Cough, unspecified: Secondary | ICD-10-CM | POA: Diagnosis not present

## 2021-11-25 DIAGNOSIS — J90 Pleural effusion, not elsewhere classified: Secondary | ICD-10-CM | POA: Diagnosis not present

## 2021-11-25 LAB — CBC WITH DIFFERENTIAL/PLATELET
Abs Immature Granulocytes: 0.12 10*3/uL — ABNORMAL HIGH (ref 0.00–0.07)
Basophils Absolute: 0 10*3/uL (ref 0.0–0.1)
Basophils Relative: 0 %
Eosinophils Absolute: 0.1 10*3/uL (ref 0.0–0.5)
Eosinophils Relative: 1 %
HCT: 31.6 % — ABNORMAL LOW (ref 36.0–46.0)
Hemoglobin: 9.7 g/dL — ABNORMAL LOW (ref 12.0–15.0)
Immature Granulocytes: 1 %
Lymphocytes Relative: 9 %
Lymphs Abs: 1.1 10*3/uL (ref 0.7–4.0)
MCH: 28.9 pg (ref 26.0–34.0)
MCHC: 30.7 g/dL (ref 30.0–36.0)
MCV: 94 fL (ref 80.0–100.0)
Monocytes Absolute: 1.5 10*3/uL — ABNORMAL HIGH (ref 0.1–1.0)
Monocytes Relative: 12 %
Neutro Abs: 10.1 10*3/uL — ABNORMAL HIGH (ref 1.7–7.7)
Neutrophils Relative %: 77 %
Platelets: 339 10*3/uL (ref 150–400)
RBC: 3.36 MIL/uL — ABNORMAL LOW (ref 3.87–5.11)
RDW: 18 % — ABNORMAL HIGH (ref 11.5–15.5)
WBC: 13 10*3/uL — ABNORMAL HIGH (ref 4.0–10.5)
nRBC: 0 % (ref 0.0–0.2)

## 2021-11-25 LAB — COMPREHENSIVE METABOLIC PANEL
ALT: 15 U/L (ref 0–44)
AST: 16 U/L (ref 15–41)
Albumin: 3.1 g/dL — ABNORMAL LOW (ref 3.5–5.0)
Alkaline Phosphatase: 59 U/L (ref 38–126)
Anion gap: 7 (ref 5–15)
BUN: 11 mg/dL (ref 8–23)
CO2: 34 mmol/L — ABNORMAL HIGH (ref 22–32)
Calcium: 8.7 mg/dL — ABNORMAL LOW (ref 8.9–10.3)
Chloride: 99 mmol/L (ref 98–111)
Creatinine, Ser: 0.61 mg/dL (ref 0.44–1.00)
GFR, Estimated: >60 mL/min (ref >60)
Glucose, Bld: 113 mg/dL — ABNORMAL HIGH (ref 70–99)
Potassium: 4.4 mmol/L (ref 3.5–5.1)
Sodium: 140 mmol/L (ref 135–145)
Total Bilirubin: 0.5 mg/dL (ref 0.3–1.2)
Total Protein: 7.4 g/dL (ref 6.5–8.1)

## 2021-11-25 LAB — URINALYSIS, ROUTINE W REFLEX MICROSCOPIC
Bilirubin Urine: NEGATIVE
Glucose, UA: NEGATIVE mg/dL
Ketones, ur: NEGATIVE mg/dL
Leukocytes,Ua: NEGATIVE
Nitrite: NEGATIVE
Protein, ur: NEGATIVE mg/dL
Specific Gravity, Urine: 1.011 (ref 1.005–1.030)
pH: 5 (ref 5.0–8.0)

## 2021-11-25 LAB — TROPONIN I (HIGH SENSITIVITY): Troponin I (High Sensitivity): 3 ng/L (ref <18)

## 2021-11-25 NOTE — ED Notes (Signed)
Oxygen tank changed 

## 2021-11-25 NOTE — ED Provider Triage Note (Signed)
Emergency Medicine Provider Triage Evaluation Note  Beth [name] , a 81 y.o. female  was evaluated in triage.  Pt complains of weakness and shortness of breath  Review of Systems  Positive: cough Negative: Fever   Physical Exam  BP 122/86 (BP Location: Right Arm)   Pulse 93   Temp 98.5 F (36.9 C) (Oral)   Resp 20   LMP 01/14/1995 (Approximate)   SpO2 97%  Gen:   Awake, no distress   Resp:  Normal effort  MSK:   Moves extremities without difficulty  Other:    Medical Decision Making  Medically screening exam initiated at 3:52 PM.  Appropriate orders placed.  [name] was informed that the remainder of the evaluation will be completed by another provider, this initial triage assessment does not replace that evaluation, and the importance of remaining in the ED until their evaluation is complete.     Fransico Meadow, [name] 11/25/21 1554

## 2021-11-25 NOTE — ED Triage Notes (Signed)
Pt with  hx of COPD here for eval of sob and productive cough x 1 week. Normally wears 3L O2. Pt wearing only 2L on EMS arrival and was 85%. Improvement to 94% on 4L.

## 2021-11-26 ENCOUNTER — Encounter: Payer: Self-pay | Admitting: Interventional Cardiology

## 2021-11-26 ENCOUNTER — Encounter (HOSPITAL_BASED_OUTPATIENT_CLINIC_OR_DEPARTMENT_OTHER): Payer: Self-pay | Admitting: Pulmonary Disease

## 2021-11-26 NOTE — ED Notes (Signed)
Security informed this Probation officer that this patient left

## 2021-11-26 NOTE — Telephone Encounter (Signed)
Patient is anemic.  Stable ECG, renal function and electrolytes.    Any bleeding issues?  Hbg < 10.  What were her sx for going to the ER?

## 2021-11-27 ENCOUNTER — Encounter: Payer: Self-pay | Admitting: Nurse Practitioner

## 2021-11-27 ENCOUNTER — Ambulatory Visit (INDEPENDENT_AMBULATORY_CARE_PROVIDER_SITE_OTHER): Payer: Medicare Other | Admitting: Nurse Practitioner

## 2021-11-27 VITALS — HR 102 | Ht 64.0 in | Wt 239.2 lb

## 2021-11-27 DIAGNOSIS — J019 Acute sinusitis, unspecified: Secondary | ICD-10-CM

## 2021-11-27 DIAGNOSIS — I480 Paroxysmal atrial fibrillation: Secondary | ICD-10-CM | POA: Diagnosis not present

## 2021-11-27 DIAGNOSIS — R0989 Other specified symptoms and signs involving the circulatory and respiratory systems: Secondary | ICD-10-CM | POA: Diagnosis not present

## 2021-11-27 DIAGNOSIS — N3001 Acute cystitis with hematuria: Secondary | ICD-10-CM | POA: Diagnosis not present

## 2021-11-27 DIAGNOSIS — J441 Chronic obstructive pulmonary disease with (acute) exacerbation: Secondary | ICD-10-CM | POA: Diagnosis not present

## 2021-11-27 DIAGNOSIS — D649 Anemia, unspecified: Secondary | ICD-10-CM

## 2021-11-27 DIAGNOSIS — I2781 Cor pulmonale (chronic): Secondary | ICD-10-CM

## 2021-11-27 DIAGNOSIS — J9611 Chronic respiratory failure with hypoxia: Secondary | ICD-10-CM

## 2021-11-27 LAB — POC COVID19 BINAXNOW: SARS Coronavirus 2 Ag: NEGATIVE

## 2021-11-27 MED ORDER — LEVOFLOXACIN 500 MG PO TABS: 500.0000 mg | ORAL_TABLET | Freq: Every day | ORAL | 0 refills | Status: DC

## 2021-11-27 MED ORDER — PREDNISONE 10 MG PO TABS: ORAL_TABLET | ORAL | 0 refills | Status: DC

## 2021-11-27 MED ORDER — LEVOFLOXACIN 500 MG PO TABS: 500.0000 mg | ORAL_TABLET | Freq: Every day | ORAL | 0 refills | Status: AC

## 2021-11-27 NOTE — Patient Instructions (Addendum)
-  Continue Advair 230 2 puffs Twice daily until you receive your new nebulized medications in the mail -Continue Allegra daily  -Continue Duoneb 3 mL every 6 hours as needed for shortness of breath or wheezing -Continue Proair inhaler 2 puffs every 6 hours as needed for shortness of breath or wheezing -Continue Flonase 1-2 sprays each nostril daily -Continue saline nasal spray 3-4 times a day -Continue Mucinex (guaifenesin) 1200 mg Twice daily  -Continue Hypertonic saline nebs 3 mL Twice daily  -Continue Flutter valve every hour while awake -Continue supplemental oxygen 3-4 lpm for goal >88-90% -Continue Yupelri 3 mL daily    -Prednisone taper. 4 tabs for 2 days, then 3 tabs for 2 days, 2 tabs for 2 days, then 1 tab for 2 days, then stop. Take in AM with food  -Levaquin 500 mg daily for 7 days. Take in AM with food. If you develop any tendon pain or 3-4 watery bowel movements within 24 hours, stop immediately and notify  -can use Azo over the counter as needed for urinary symptoms Lasix 40 mg daily for 5 days. If you develop any swelling of the face or tongue, stop and go to the emergency room Potassium 10 meq daily while on lasix   If you develop any dizziness, lightheadedness, worsening bruising, increased HR, you need to go to the hospital for further evaluation  Plan to repeat labs at follow up Talk with cardiology about your Eliquis and decreased hemoglobin    Follow up in 1 week with Dr. Elsworth Soho  or Alanson Aly. If symptoms do not improve or worsen, please contact office for sooner follow up or seek emergency care.

## 2021-11-27 NOTE — Progress Notes (Unsigned)
_0  ID: Beth [name], female    DOB: Jul 17, 1940, 81 y.o.   MRN: [id number]  Chief Complaint  Patient presents with   Follow-up    Pt HFU she went to the ED on Monday for SOB and weakness, pt reports she is achy, no fever, nasal congestion, productive cough (yellow/green), headache. Pt also having a lot of non-respiratory complaints hasn't seen PCP    Referring provider: Kathyrn Lass, MD  HPI: 81 year old female, former smoker (41 pack years) followed for COPD and chronic respiratory failure on supplemental O2.  She is a patient of Dr. Bari Mantis and was last seen on 12/18/2020 by Volanda Napoleon NP.  Past medical history significant for permanent A. fib, hypertension, organic brain syndrome, stroke, BRCA2 positive, obesity.  TEST/EVENTS:  11/04/2018 echocardiogram: LVEF 65 to 70%.  LV diastolic dysfunction with impaired relaxation.  RV normal function and size.  Left atrial size mildly dilated.  Mild MV regurgitation. 05/17/2019 echocardiogram: LVEF 55 to 60%.  LV diastolic function could not be evaluated.  RV function not well-visualized.  MV grossly normal with mild regurgitation. 12/06/2020 CXR 1 view: Calcified granulomas within the lungs again noted.  No evidence of focal airspace disease, pulmonary edema, suspicious pulmonary nodule/mass, pleural effusion or pneumothorax 12/21/2020 CT chest without contrast: Heart mildly enlarged.  Atherosclerosis.  Pulmonary trunk is distending suggesting underlying pulmonary artery hypertension.  Calcified lymph nodes are present in the mediastinum.  Emphysematous changes are present of the lungs.  Bilateral calcified granuloma.  Patchy atelectasis or infiltrate is present at the lung bases bilaterally and there is mild consolidation in the right lower lobe.  Scattered pulmonary nodules are seen in the right lower lobe, the largest measuring up to 1.1 cm in diameter.  Complex hypodensity in the upper pole of the left kidney measuring 1.3 cm 02/19/2021 HRCT:  atherosclerosis. Dilatation of the pulmonic trunk, concerning for PAH. Densely calcified mediastinal lymph nodes are incidentally noted. Many of the smaller clustered peribronchovascular pulmonary nodules in the RLL have resolved compared to prior study, there are several persistent and enlarging pulmonary nodules in the lower lobes. Given hx of breast cancer, recommended PET scan. Evidence of chronic post infectious or inflammatory scarring throughout the lung bases b/l.  03/05/2021 PET scan: interval development of RLL collapse/consolidation with intense hypermetabolism in the infrahilar region. Central obstructing neoplasm not excluded. Associated small hypermetabolic mediastinal LAD. LLL nodule is hypermetabolic.  05/20/2021 CT chest: atherosclerosis. Small mediastinal lymph nodes are stable. Centrilobular and paraseptal emphysema. Right LLL collapse/consolidation resolved. Lobular 13 mm RLL nodule not substantially changed from 14 mm on HRCT. LLL collapse/consolidation today which incorporates and obscures the 11 mm LLL nodule. Small left pleural effusion. Calcified granulomata noted b/l.  06/18/2021 Thoracentesis: 150 mL light pink fluid; cytology negative for malignant cells, there were reactive mesothelial cells with inflammatory cells.  06/20/2021 swallow study: mild aspiration risk; recommend continued regular solids with thin liquids and remain upright after meals  06/13/2021: OV with Dr. Elsworth Soho. Treated numerous times over the past 6 months for recurrent pneumonias. She reported persistent DOE with increased albuterol use. Using mucinex but not being as compliant with hypertonic nebs and flutter valve. Concern for malignancy; RLL nodule unchanged in size from December which is reassuring for slow growth. Discussed possible SBRT. Pt comfortable with the idea or no treatment should this turn out to be cancer. Suggested outpatient palliative care consult - referral placed. Recommended swallow study to evaluate  for aspiration given pneumonias. Treated with another extended levaquin course x  10 days. Set up for thoracentesis in IR for effusion.   07/08/2021: Today - follow up Patient presents today for follow up after undergoing thoracentesis in IR with 150 cc of light pink fluid. Cytology showed reactive mesothelial cells and inflammatory cells; no malignant cells. Culture were no growth. There were no serum labs to unable to evaluate Light's criteria. She reports that since the procedure, she has been a little more short of breath with exertion. Her cough is unchanged and stable since she completed levaquin course. It is primarily non-productive. She will occasionally produce small amount of cream mucus though. She denies any wheezing, lower extremity swelling, hemoptysis, weight loss, fevers, night sweats. She continues on Advair but she doesn't feel like it works for her anymore. Has difficulties inhaling the medication. She does use her duoneb 1-2 times a day and gets much better benefit from this. She occasionally uses her hypertonic nebs. Reports that she has been using mucinex and flutter valve. She continues on supplemental oxygen without any increasing requirements.   Allergies  Allergen Reactions   Prozac [Fluoxetine Hcl] Other (See Comments)    seizures   Sulfa Antibiotics Nausea And Vomiting and Other (See Comments)    TINGLING IN LEGS ALSO   Micardis [Telmisartan] Swelling and Other (See Comments)    Swelling, bruising Swelling, bruising   Prednisone Other (See Comments)    mood changes Mood changes   Allegra [Fexofenadine]     Makes her sleep 24 hours    Prozac [Fluoxetine]     seizure   Augmentin [Amoxicillin-Pot Clavulanate] Rash   Avelox [Moxifloxacin Hcl In [place]] Rash    LEVAQUIN IS OK-SEE NOTE   Lisinopril-Hydrochlorothiazide Other (See Comments)    unknown   Norvasc [Amlodipine Besylate] Other (See Comments)    fatigue    Immunization History  Administered Date(s)  Administered   Influenza Split 01/03/2008, 11/06/2008   Influenza, High Dose Seasonal PF 11/22/2013, 11/27/2014, 10/28/2016, 10/27/2017, 09/14/2018, 10/24/2019   Influenza,inj,Quad PF,6+ Mos 10/19/2018   Influenza-Unspecified 10/08/2015, 11/05/2020   PFIZER(Purple Top)SARS-COV-2 Vaccination 02/02/2019, 02/23/2019, 11/26/2019   Pneumococcal Conjugate-13 11/22/2013   Pneumococcal Polysaccharide-23 05/05/2008   Td 05/22/2003   Tdap 08/20/2012   Zoster, Live 08/20/2012, 08/28/2020, 02/04/2021    Past Medical History:  Diagnosis Date   Afib ([place])    diagnosed 05/17/19   Arthritis    BRCA2 positive 2017   COPD (chronic obstructive pulmonary disease) ([place])    Diabetes mellitus without complication ([place])    Diverticulosis    Dysrhythmia    Afib   Hearing loss    HTN (hypertension)    Memory loss    Pre-diabetes    Seasonal depression ([facility])    Seizure disorder, complex partial, 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    Vestibulitis of ear     Tobacco History: Social History   Tobacco Use  Smoking Status Former   Packs/day: 1.00   Years: 41.00   Total pack years: 41.00   Types: Cigarettes   Quit date: 01/13/2006   Years since quitting: 15.8  Smokeless Tobacco Never   Counseling given: Not Answered   Outpatient Medications Prior to Visit  Medication Sig Dispense Refill   acetaminophen (TYLENOL) 325 MG tablet Take 1-2 tablets (325-650 mg total) by mouth every 4 (four) hours as needed for mild  pain.     ADVAIR HFA 230-21 MCG/ACT inhaler Inhale 2 puffs into the lungs 2 (two) times daily.     Albuterol Sulfate, sensor, (PROAIR DIGIHALER) 108 (90 Base) MCG/ACT AEPB Inhale 2 puffs into the lungs every 4 (four) hours as needed.     apixaban (ELIQUIS) 5 MG TABS tablet TAKE 1 TABLET TWICE A DAY 180 tablet 2   aspirin EC 81 MG  tablet Take 81 mg by mouth daily.     atorvastatin (LIPITOR) 20 MG tablet TAKE 1 TABLET DAILY 90 tablet 3   captopril (CAPOTEN) 50 MG tablet Take 50 mg by mouth 2 (two) times daily.     Cholecalciferol (VITAMIN D3) 2000 units capsule Take 2,000 Units by mouth daily.      diltiazem (CARDIZEM CD) 360 MG 24 hr capsule TAKE 1 CAPSULE DAILY (DOSE INCREASE) 90 capsule 2   famotidine (PEPCID) 20 MG tablet Take 1 tablet (20 mg total) by mouth 2 (two) times daily. 60 tablet 0   ferrous sulfate 324 MG TBEC Take 324 mg by mouth daily with breakfast.     fluticasone (FLONASE) 50 MCG/ACT nasal spray Place 1 spray into the nose daily as needed for allergies. Spray twice daily as needed     gabapentin (NEURONTIN) 600 MG tablet TAKE ONE-HALF (1/2) TABLET AT BEDTIME 45 tablet 3   guaiFENesin (MUCINEX) 600 MG 12 hr tablet Take 1,200 mg by mouth 2 (two) times daily.     levETIRAcetam (KEPPRA) 750 MG tablet TAKE 1 TABLET TWICE A DAY 180 tablet 3   LEXAPRO 20 MG tablet Take 1 tablet (20 mg total) by mouth daily. Morning dosage 90 tablet 3   loratadine (CLARITIN) 5 MG chewable tablet Chew 1 tablet (5 mg total) by mouth daily. 30 tablet 2   metoprolol tartrate (LOPRESSOR) 25 MG tablet Take 25 mg by mouth 2 (two) times daily as needed.     MICONAZOLE NITRATE EX Apply topically.     pantoprazole (PROTONIX) 40 MG tablet Take 40 mg by mouth 2 (two) times daily.     revefenacin (YUPELRI) 175 MCG/3ML nebulizer solution Take 3 mLs (175 mcg total) by nebulization daily. 21 mL 0   sodium chloride HYPERTONIC 3 % nebulizer solution Take by nebulization in the morning and at bedtime. 75 mL 11   Spacer/Aero-Holding Chambers (AEROCHAMBER PLUS WITH MASK) inhaler As needed with inhaler 1 each 0   vitamin B-12 (CYANOCOBALAMIN) 500 MCG tablet Take 1 tablet (500 mcg total) by mouth daily.     arformoterol (BROVANA) 15 MCG/2ML NEBU Take 2 mLs (15 mcg total) by nebulization 2 (two) times daily. (Patient not taking: Reported on 11/06/2021)  360 mL 3   budesonide (PULMICORT) 0.5 MG/2ML nebulizer solution Take 2 mLs (0.5 mg total) by nebulization in the morning and at bedtime. (Patient not taking: Reported on 11/06/2021) 360 mL 3   furosemide (LASIX) 40 MG tablet Take 1 tablet (40 mg total) by mouth daily for 10 days. 10 tablet 0   potassium chloride (KLOR-CON M) 10 MEQ tablet Take 1 tablet (10 mEq total) by mouth daily for 10 days. 10 tablet 0   No facility-administered medications prior to visit.     Review of Systems:   Constitutional: No weight loss or gain, night sweats, fevers, chills. +fatigue, generalized weakness (stable) HEENT: No headaches, difficulty swallowing, tooth/dental problems, or sore throat. No sneezing, itching, ear ache, nasal congestion, or post nasal drip CV:  No chest pain, orthopnea, swelling in lower extremities, anasarca, dizziness,  palpitations, syncope Resp: +shortness of breath with exertion (increased); minimally productive cough (improved). No excess mucus or change in color of mucus. No hemoptysis. No chest wall deformity GI:  No heartburn, indigestion, abdominal pain, nausea, vomiting, diarrhea, change in bowel habits, loss of appetite, bloody stools.  GU: No dysuria, change in color of urine, urgency or frequency.  No flank pain, no hematuria  Skin: No rash, lesions, ulcerations MSK:  No joint pain or swelling.  No decreased range of motion.  No back pain. Neuro: No dizziness or lightheadedness.  Psych: No depression or anxiety. Mood stable.     Physical Exam:  Pulse (!) 102   Ht _0  (1.626 m)   Wt 239 lb 3.2 oz (108.5 kg)   LMP 01/14/1995 (Approximate)   SpO2 92%   BMI 41.06 kg/m   GEN: Pleasant, interactive, chronically-ill appearing; obese; in no acute distress. HEENT:  Normocephalic and atraumatic. EACs patent bilaterally. TM pearly gray with present light reflex bilaterally. PERRLA. Sclera white. Nasal turbinates pink, moist and patent bilaterally. No rhinorrhea present.  Oropharynx pink and moist, without exudate or edema. No lesions, ulcerations, or postnasal drip.  NECK:  Supple w/ fair ROM. No JVD present. Normal carotid impulses w/o bruits. Thyroid symmetrical with no goiter or nodules palpated. No lymphadenopathy.   CV: Irregular rhythm, rate controlled (chronic a fib), no m/r/g, no peripheral edema. Pulses intact, +2 bilaterally. No cyanosis, pallor or clubbing. PULMONARY:  Unlabored, regular breathing. Diminished bilaterally P w/o wheezes/rales/rhonchi. No accessory muscle use. No dullness to percussion. On supplemental 3L O2 GI: BS present and normoactive. Soft, non-tender to palpation. No organomegaly or masses detected. No CVA tenderness. MSK: No erythema, warmth or tenderness. Cap refil <2 sec all extrem. No deformities or joint swelling noted.  Neuro: A/Ox3. No focal deficits noted.   Skin: Warm, no lesions or rashe Psych: Normal affect and behavior. Judgement and thought content appropriate.     Lab Results:  CBC    Component Value Date/Time   WBC 13.0 (H) 11/25/2021 1606   RBC 3.36 (L) 11/25/2021 1606   HGB 9.7 (L) 11/25/2021 1606   HGB 13.5 08/02/2020 1221   HCT 31.6 (L) 11/25/2021 1606   HCT 41.4 08/02/2020 1221   PLT 339 11/25/2021 1606   PLT 317 08/02/2020 1221   MCV 94.0 11/25/2021 1606   MCV 96 08/02/2020 1221   MCH 28.9 11/25/2021 1606   MCHC 30.7 11/25/2021 1606   RDW 18.0 (H) 11/25/2021 1606   RDW 12.9 08/02/2020 1221   LYMPHSABS 1.1 11/25/2021 1606   MONOABS 1.5 (H) 11/25/2021 1606   EOSABS 0.1 11/25/2021 1606   BASOSABS 0.0 11/25/2021 1606    BMET    Component Value Date/Time   NA 140 11/25/2021 1606   NA 146 (H) 08/02/2020 1221   K 4.4 11/25/2021 1606   CL 99 11/25/2021 1606   CO2 34 (H) 11/25/2021 1606   GLUCOSE 113 (H) 11/25/2021 1606   BUN 11 11/25/2021 1606   BUN 13 08/02/2020 1221   CREATININE 0.61 11/25/2021 1606   CREATININE 0.85 02/22/2018 1051   CALCIUM 8.7 (L) 11/25/2021 1606   GFRNONAA >60  11/25/2021 1606   GFRNONAA >60 02/22/2018 1051   GFRAA >60 06/20/2019 0657   GFRAA >60 02/22/2018 1051    BNP    Component Value Date/Time   BNP 41.0 12/06/2020 1526     Imaging:  DG Chest 1 View  Result Date: 11/25/2021 CLINICAL DATA:  Cough, shortness of breath EXAM: CHEST  1 VIEW COMPARISON:  Previous studies including the examination of 07/08/2021 FINDINGS: Transverse diameter of heart is increased. Small bilateral pleural effusions are seen, more so on the left side. Central pulmonary vessels are prominent without signs of alveolar pulmonary edema. No new focal pulmonary infiltrates are seen. There is no pneumothorax. IMPRESSION: Cardiomegaly. Small bilateral pleural effusions, more so on the left side. There are no signs of alveolar pulmonary edema or focal pulmonary consolidation. Electronically Signed   By: Elmer Picker M.D.   On: 11/25/2021 16:53   MR ABDOMEN WWO CONTRAST  Result Date: 11/14/2021 CLINICAL DATA:  Further evaluation of indeterminate lesion in the left lobe of the liver. EXAM: MRI ABDOMEN WITHOUT AND WITH CONTRAST TECHNIQUE: Multiplanar multisequence MR imaging of the abdomen was performed both before and after the administration of intravenous contrast. CONTRAST:  58m GADAVIST GADOBUTROL 1 MMOL/ML IV SOLN COMPARISON:  CT abdomen pelvis February 19, 2021. FINDINGS: Lower chest: Right lower lobe pulmonary nodule measures 13 mm on image 2/4, unchanged from chest CT July 19, 2021 Hepatobiliary: No significant hepatic steatosis. No suspicious hepatic lesion. No signal abnormality identified along the posterior/central aspect of segment IVa/b to correspond with the findings on prior CT abdomen pelvis dated February 19, 2021. This hypodensity with seen on PET-CT March 05, 2021 and did not demonstrate abnormal FDG avidity. Although CT chest May 20, 2021 and CT chest July 19, 2021 did not completely cover the hypodense area seen on prior imaging, the part of liver  containing superior most portion of this hypodensity do not demonstrate the abnormality seen on prior imaging. Cholelithiasis/sludge in a distended gallbladder without gallbladder wall thickening or pericholecystic fluid. No biliary ductal dilation. Pancreas: No pancreatic ductal dilation or evidence of acute inflammation. Spleen:  No splenomegaly or focal splenic lesion. Adrenals/Urinary Tract: Bilateral adrenal glands are within normal limits. No hydronephrosis. Heterogeneous intrinsically T1 hyperintense 10 mm left upper pole renal lesion on image 26/10 evaluation of which is limited by its small size and motion on this examination but does not definitely demonstrate enhancement on subtraction imaging. Fluid signal bilateral renal lesions are compatible with cysts and considered benign requiring no independent imaging follow-up. Multifocal left renal cortical scarring. Stomach/Bowel: Visualized portions within the abdomen are unremarkable. Vascular/Lymphatic:  Aortic atherosclerosis.  Smooth IVC contours. Other:  No significant abdominal free fluid. Musculoskeletal: No suspicious bone lesions identified. IMPRESSION: 1. No suspicious hepatic lesion. No signal abnormality identified along the posterior/central aspect of segment IVa/b to correspond with the findings on prior CT abdomen pelvis dated February 19, 2021, which was not hypermetabolic on prior PET-CT and although incompletely covered on 2 subsequent chest CTs appear to have resolved on those examinations. Findings which most likely reflect resolved focal fatty infiltration 2. Inhomogeneous T1 hyperintense 10 mm left upper pole renal lesion evaluation of which is limited by small size and motion degradation without definite postcontrast enhancement on subtraction imaging. 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 The large majority of Bosniak IIF masses are benign. When malignant, nearly all are indolent. Generally, Bosniak IIF  masses are followed by renal protocol MRI or CT with and without contrast at 6 months and 12 months, then annually for a total of 5 years to assess for morphologic change. (Reference: Bosniak Classification of Cystic Renal Masses, Version 2019. Radiology 2019; 292(2): [phone]). 3. Cholelithiasis/sludge in a distended gallbladder without gallbladder wall thickening or pericholecystic fluid. Electronically Signed   By: JDahlia BailiffM.D.   On: 11/14/2021 12:35  Latest Ref Rng & Units 10/05/2015    1:52 PM  PFT Results  FVC-Pre L 1.73   FVC-Predicted Pre % 60   FVC-Post L 1.88   FVC-Predicted Post % 65   Pre FEV1/FVC % % 51   Post FEV1/FCV % % 54   FEV1-Pre L 0.89   FEV1-Predicted Pre % 41   FEV1-Post L 1.02   DLCO uncorrected ml/min/mmHg 10.33   DLCO UNC% % 42   DLCO corrected ml/min/mmHg 9.75   DLCO COR %Predicted % 40   DLVA Predicted % 54   TLC L 5.38   TLC % Predicted % 106   RV % Predicted % 154     No results found for: "NITRICOXIDE"      Assessment & Plan:   No problem-specific Assessment & Plan notes found for this encounter.   I spent 41 minutes of dedicated to the care of this patient on the date of this encounter to include pre-visit review of records, face-to-face time with the patient discussing conditions above, post visit ordering of testing, clinical documentation with the electronic health record, making appropriate referrals as documented, and communicating necessary findings to members of the patients care team.  Clayton Bibles, NP 11/27/2021  Pt aware and understands NP's role.

## 2021-11-28 ENCOUNTER — Encounter: Payer: Self-pay | Admitting: Nurse Practitioner

## 2021-11-28 ENCOUNTER — Ambulatory Visit: Payer: Medicare Other | Admitting: Adult Health

## 2021-11-28 DIAGNOSIS — N390 Urinary tract infection, site not specified: Secondary | ICD-10-CM | POA: Insufficient documentation

## 2021-11-28 DIAGNOSIS — D649 Anemia, unspecified: Secondary | ICD-10-CM | POA: Insufficient documentation

## 2021-11-28 DIAGNOSIS — J019 Acute sinusitis, unspecified: Secondary | ICD-10-CM | POA: Insufficient documentation

## 2021-11-28 MED ORDER — POTASSIUM CHLORIDE CRYS ER 10 MEQ PO TBCR: 10.0000 meq | EXTENDED_RELEASE_TABLET | Freq: Every day | ORAL | 0 refills | Status: DC

## 2021-11-28 MED ORDER — FUROSEMIDE 40 MG PO TABS: 40.0000 mg | ORAL_TABLET | Freq: Every day | ORAL | 0 refills | Status: DC

## 2021-11-28 NOTE — Assessment & Plan Note (Signed)
AECOPD likely related to viral illness.  COVID testing was negative today.  CXR from ED visit without superimposed infection.  Did have small bilateral pleural effusions.  Question parapneumonic versus volume overload.  She is also having acute sinusitis and UTI.  We are going to treat her with empiric Levaquin, which will cover for all of these, and prednisone taper.  Encouraged to continue with mucociliary clearance therapies.  She will continue triple therapy regimen of Advair and Yupelri.  Advised that she use her albuterol neb treatments at least twice daily until symptoms improve.  She was nontoxic-appearing and vital signs were stable in office today.  Strict ED/return precautions provided.  Patient Instructions  -Continue Advair 230 2 puffs Twice daily until you receive your new nebulized medications in the mail -Continue Allegra daily  -Continue Duoneb 3 mL every 6 hours as needed for shortness of breath or wheezing -Continue Proair inhaler 2 puffs every 6 hours as needed for shortness of breath or wheezing -Continue Flonase 1-2 sprays each nostril daily -Continue saline nasal spray 3-4 times a day -Continue Mucinex (guaifenesin) 1200 mg Twice daily  -Continue Hypertonic saline nebs 3 mL Twice daily  -Continue Flutter valve every hour while awake -Continue supplemental oxygen 3-4 lpm for goal >88-90% -Continue Yupelri 3 mL daily    -Prednisone taper. 4 tabs for 2 days, then 3 tabs for 2 days, 2 tabs for 2 days, then 1 tab for 2 days, then stop. Take in AM with food  -Levaquin 500 mg daily for 7 days. Take in AM with food. If you develop any tendon pain or 3-4 watery bowel movements within 24 hours, stop immediately and notify  -can use Azo over the counter as needed for urinary symptoms  If you develop any dizziness, lightheadedness, worsening bruising, increased HR, you need to go to the hospital for further evaluation  Plan to repeat labs at follow up Talk with cardiology about your  Eliquis and decreased hemoglobin    Follow up in 1 week with Dr. Elsworth Soho  or Joellen Jersey Tawnia Schirm,NP. If symptoms do not improve or worsen, please contact office for sooner follow up or seek emergency care.

## 2021-11-28 NOTE — Assessment & Plan Note (Signed)
Stable without increased O2 requirement.  Continue supplemental oxygen 3 to 4 L/min for goal greater than 88 to 90%.

## 2021-11-28 NOTE — Assessment & Plan Note (Signed)
Rate controlled today. 

## 2021-11-28 NOTE — Assessment & Plan Note (Signed)
On Eliquis. No obvious source of bleeding. Possibly hemodilutional? She is discussing anemia and Eliquis use with cardiology. Strict ED precautions. Recheck CBC at follow up.

## 2021-11-28 NOTE — Assessment & Plan Note (Signed)
No flank pain on exam today. See above plan.

## 2021-11-28 NOTE — Assessment & Plan Note (Signed)
See above plan. 

## 2021-11-28 NOTE — Assessment & Plan Note (Addendum)
Worsening bilateral lower extremity edema.  She also has small bilateral pleural effusions on imaging.  Possibly parapneumonic but given her evidence of volume overload on exam, we will treat her with short Lasix course.  Possible that her hemoglobin drop is related to hemodilution? Recheck BMET and CBC upon return.

## 2021-12-02 ENCOUNTER — Telehealth: Payer: Self-pay

## 2021-12-02 NOTE — Telephone Encounter (Signed)
     Patient  visit on 11/14  at [facility]  Have you been able to follow up with your primary care physician? NA  The patient was or was not able to obtain any needed medicine or equipment. NA  Are there diet recommendations that you are having difficulty following? NA  Patient expresses understanding of discharge instructions and education provided has no other needs at this time.  NA     [place], [facility], Care Management  [phone] [address], [place], [place] [address] Phone: [phone] Email: [email or web address]

## 2021-12-02 NOTE — Telephone Encounter (Signed)
Reviewed the pulm note.  I agree with trying some Lasix.    Given the decrease in Hbg and bruising, we could try her off of the Eliquis.  The downside is an increase in stroke risk due to atrial fibrillation but it may help her hemoglobin and help her feel better.  That is an option.  JV

## 2021-12-04 ENCOUNTER — Telehealth: Payer: Self-pay | Admitting: Pulmonary Disease

## 2021-12-04 MED ORDER — LEVOFLOXACIN 500 MG PO TABS: 500.0000 mg | ORAL_TABLET | Freq: Every day | ORAL | 0 refills | Status: DC

## 2021-12-04 MED ORDER — PREDNISONE 10 MG PO TABS: ORAL_TABLET | ORAL | 0 refills | Status: DC

## 2021-12-04 NOTE — Telephone Encounter (Signed)
Called and spoke to patient and she verified pharmacy with me over the phone. I told her the additional days of Levaquin and the prednisone. She verbalized understanding. I also went over [place] recommendations. Nothing further needed

## 2021-12-04 NOTE — Telephone Encounter (Signed)
Patient called to request a refill on her antibiotic and Levaquin to be sent to the [facility] on [address].  She stated that she is all out and she has an appt. On 11/29 but does not want to go through the holidays without medication.  Please advise and call patient to discuss at [phone]

## 2021-12-04 NOTE — Telephone Encounter (Signed)
Called patient and she states that she is not doing well. Large clumps of gray mucus (Nickel size) She states that it is getting stuck and making it harder for her to breath.   2 more days left of prednisone  States that she is taking all the medications that were recommended at last office visit with Joellen Jersey. She has follow up with Dr Elsworth Soho on 11/29. But she is asking if we could send her in a short course of Levaquin until she sees Dr Elsworth Soho.   Please advise Beth [name].

## 2021-12-04 NOTE — Telephone Encounter (Signed)
Please send in 3 additional days of levaquin 500 mg once daily. Extend prednisone - take 20 mg for 5 days then 10 mg for 5 days. Ensure she is using guaifenesin 1200 mg Twice daily and push fluids. Use neb every 6 hours to help open airways and facilitate cough. Go to ED if symptoms worsen or do not improve.

## 2021-12-11 ENCOUNTER — Encounter (HOSPITAL_BASED_OUTPATIENT_CLINIC_OR_DEPARTMENT_OTHER): Payer: Self-pay | Admitting: Pulmonary Disease

## 2021-12-11 ENCOUNTER — Ambulatory Visit (INDEPENDENT_AMBULATORY_CARE_PROVIDER_SITE_OTHER): Payer: Medicare Other | Admitting: Pulmonary Disease

## 2021-12-11 VITALS — BP 118/78 | HR 93 | Temp 98.7°F | Ht 64.0 in | Wt 240.0 lb

## 2021-12-11 DIAGNOSIS — J9612 Chronic respiratory failure with hypercapnia: Secondary | ICD-10-CM | POA: Diagnosis not present

## 2021-12-11 DIAGNOSIS — J019 Acute sinusitis, unspecified: Secondary | ICD-10-CM

## 2021-12-11 DIAGNOSIS — J9611 Chronic respiratory failure with hypoxia: Secondary | ICD-10-CM

## 2021-12-11 DIAGNOSIS — I6521 Occlusion and stenosis of right carotid artery: Secondary | ICD-10-CM | POA: Diagnosis not present

## 2021-12-11 DIAGNOSIS — R911 Solitary pulmonary nodule: Secondary | ICD-10-CM | POA: Diagnosis not present

## 2021-12-11 DIAGNOSIS — J301 Allergic rhinitis due to pollen: Secondary | ICD-10-CM

## 2021-12-11 DIAGNOSIS — J441 Chronic obstructive pulmonary disease with (acute) exacerbation: Secondary | ICD-10-CM

## 2021-12-11 MED ORDER — AZELASTINE HCL 0.1 % NA SOLN: 1.0000 | Freq: Two times a day (BID) | NASAL | Status: DC

## 2021-12-11 NOTE — Assessment & Plan Note (Signed)
Recently treated with Levaquin and prednisone. She will continue Advair and Yupelri regimen and use albuterol for as

## 2021-12-11 NOTE — Assessment & Plan Note (Signed)
Antihistaminics make her sleepy.  Allegra does not seem to be effective anymore. Will add azelastine nasal spray in the daytime and she can use pediatric dose

## 2021-12-11 NOTE — Assessment & Plan Note (Signed)
Continue oxygen 24/7. 

## 2021-12-11 NOTE — Patient Instructions (Addendum)
X CT chest in jan 2024  X Rx for azelastin nasal spray each nare

## 2021-12-11 NOTE — Assessment & Plan Note (Signed)
58-monthfollow-up CT chest in January 2024

## 2021-12-11 NOTE — Progress Notes (Signed)
 Subjective:    Patient ID: Beth [name], female    DOB: 10/09/1940, 81 y.o.   MRN: [id number]  HPI 81 yo former smoker for FU of COPD & chronic respiratory failure  -Enlarging right lower lobe nodule since 2020   PMH :  HTN, organic brain syndrome, stroke, BRCA2 positive, Obesity. 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  Subsequent seizures. Has some word finding issues and cognitive deficits with emotional lability   -BRCA positive after her daughter was diagnosed with breast cancer     She was treated in February for right lower lobe consolidation. She developed left lower lobe consolidation 05/2021 and received another round of Levaquin , then developed a small effusion. 06/18/21 150 cc of thoracentesis performed , predominantly lymphocytic exudate, cytology negative    10/2021 OV > Bipedal edema - could be r/t cardizem. More likely related to cor pulmonale due to severe hypoxia.  lasix x 10 ds   ED on 11/13 with multiple days of increased shortness of breath, productive cough with yellow sputum and dysuria.  She was also feeling extremely fatigued and was having sinus congestion/pressure with headaches. CXR showed small bilateral pleural effusions without any focal consolidation.  Her white count was slightly elevated at 13 and hemoglobin had dropped to 9.7.  She did have some blood and small amount of bacteria in her UA   OV 11/27/21 Rx for  acute sinusitis and UTIwith empiric Levaquin, and prednisone taper.     Chief Complaint  Patient presents with   Follow-up    Pt states she is somewhat better but still very weak. Pt states her head is still congested and having a productive cough. Pt states that her edema is much better.    Arrives in a wheelchair, accompanied by daughter who corroborates history. Complains of generalized weakness although somewhat improved from recent last visit 2  weeks ago.  Complains of nasal drip, Allegra or antihistaminics make her sleepy she wonders about alternative medications.  Compliant with Advair and Yupelri regimen. Leg edema is improved with Lasix Hemoglobin drop is being investigated by primary care, no evidence of GI bleeding She does bruise easily, on apixaban  Significant tests/ events reviewed  CT chest without con 07/2021 unchanged right lower lobe 1.4 cm nodule,, small left effusion, left lower lobe nodule not noted, left lower lobe consolidation resolved, evidence of old calcified granulomas   CT chest 05/2021 right lower lobe consolidation overall, right lower lobe nodule unchanged, left lower lobe consolidation/small effusion, left lower lobe nodule not noted   02/2021 PET >> right lower lobe consolidation , right lower lobe nodule not visualized, left lower lobe 1.1 cm nodule hypermetabolic, mediastinal lymph nodes hypermetabolic 02/2021 HRCT was obtained on 02/20/2021 which showed that right lower lobe consolidation had resolved with minimal scarring, no ILD was noted, there was 1 enlarging 1.4 x 1.2 cm nodule.   12/2020 CT chest without contrast 12/9 - bronchiectasis at the bases likely related to mucoid impaction   12/2018 CT chest Scattered pulmonary nodules are seen in the right lower lobe, the largest measuring up to 1.1 cm in diameter.      CT angio chest 07/2015 >> no pulmonary embolism, no emphysema, calcified mediastinal LNs PFTs 09/2015>> severe airway obstruction with FEV1 40%, improves to 47% with albuterol, DLCO decreased to 45%   Review of Systems neg for any significant sore throat,   dysphagia, itching, sneezing, nasal congestion or excess/ purulent secretions, fever, chills, sweats, unintended wt loss, pleuritic or exertional cp, hempoptysis, orthopnea pnd or change in chronic leg swelling. Also denies presyncope, palpitations, heartburn, abdominal pain, nausea, vomiting, diarrhea or change in bowel or urinary habits,  dysuria,hematuria, rash, arthralgias, visual complaints, headache, numbness weakness or ataxia.     Objective:   Physical Exam  Gen. Pleasant, obese, elderly in no distress ENT - no lesions, no post nasal drip Neck: No JVD, no thyromegaly, no carotid bruits Lungs: no use of accessory muscles, no dullness to percussion, decreased without rales or rhonchi  Cardiovascular: Rhythm regular, heart sounds  normal, no murmurs or gallops, no peripheral edema Musculoskeletal: No deformities, no cyanosis or clubbing , no tremors       Assessment & Plan:   She has recently been treated with a course of antibiotic and prednisone and feels somewhat improved.  She remains with significant debility.  She has filled out a MOLST form past and has been seen by palliative care.  She questions the utility of screening for breast and given her significant comorbidities

## 2021-12-12 ENCOUNTER — Telehealth: Payer: Self-pay | Admitting: Pulmonary Disease

## 2021-12-13 MED ORDER — AZELASTINE HCL 0.1 % NA SOLN: 1.0000 | Freq: Two times a day (BID) | NASAL | 12 refills | Status: DC

## 2021-12-13 NOTE — Telephone Encounter (Signed)
Pt called the office. Rx for pt's nasal spray ahs been sent to preferred pharmacy for pt. Nothing further needed.

## 2021-12-13 NOTE — Telephone Encounter (Signed)
Called patient but she did not answer. Will call back later.

## 2021-12-16 DIAGNOSIS — J9611 Chronic respiratory failure with hypoxia: Secondary | ICD-10-CM | POA: Diagnosis not present

## 2021-12-16 DIAGNOSIS — R600 Localized edema: Secondary | ICD-10-CM | POA: Diagnosis not present

## 2021-12-16 DIAGNOSIS — R0609 Other forms of dyspnea: Secondary | ICD-10-CM | POA: Diagnosis not present

## 2021-12-16 DIAGNOSIS — D6489 Other specified anemias: Secondary | ICD-10-CM | POA: Diagnosis not present

## 2021-12-31 DIAGNOSIS — J449 Chronic obstructive pulmonary disease, unspecified: Secondary | ICD-10-CM | POA: Diagnosis not present

## 2021-12-31 DIAGNOSIS — Z8679 Personal history of other diseases of the circulatory system: Secondary | ICD-10-CM | POA: Diagnosis not present

## 2021-12-31 DIAGNOSIS — I4891 Unspecified atrial fibrillation: Secondary | ICD-10-CM | POA: Diagnosis not present

## 2021-12-31 DIAGNOSIS — R911 Solitary pulmonary nodule: Secondary | ICD-10-CM | POA: Diagnosis not present

## 2021-12-31 DIAGNOSIS — D649 Anemia, unspecified: Secondary | ICD-10-CM | POA: Diagnosis not present

## 2022-01-15 DIAGNOSIS — Z9981 Dependence on supplemental oxygen: Secondary | ICD-10-CM | POA: Diagnosis not present

## 2022-01-15 DIAGNOSIS — J9691 Respiratory failure, unspecified with hypoxia: Secondary | ICD-10-CM | POA: Diagnosis not present

## 2022-01-15 DIAGNOSIS — I739 Peripheral vascular disease, unspecified: Secondary | ICD-10-CM | POA: Diagnosis not present

## 2022-01-15 DIAGNOSIS — D6489 Other specified anemias: Secondary | ICD-10-CM | POA: Diagnosis not present

## 2022-01-15 DIAGNOSIS — Z7189 Other specified counseling: Secondary | ICD-10-CM | POA: Diagnosis not present

## 2022-01-15 DIAGNOSIS — Z6841 Body Mass Index (BMI) 40.0 and over, adult: Secondary | ICD-10-CM | POA: Diagnosis not present

## 2022-01-28 ENCOUNTER — Encounter (HOSPITAL_BASED_OUTPATIENT_CLINIC_OR_DEPARTMENT_OTHER): Payer: Self-pay

## 2022-01-28 ENCOUNTER — Ambulatory Visit (HOSPITAL_BASED_OUTPATIENT_CLINIC_OR_DEPARTMENT_OTHER)
Admission: RE | Admit: 2022-01-28 | Discharge: 2022-01-28 | Disposition: A | Discharge status: Home / Self Care | Payer: Medicare Other | Source: Ambulatory Visit | Attending: Pulmonary Disease | Admitting: Pulmonary Disease

## 2022-01-28 DIAGNOSIS — J441 Chronic obstructive pulmonary disease with (acute) exacerbation: Secondary | ICD-10-CM | POA: Diagnosis not present

## 2022-01-28 DIAGNOSIS — J90 Pleural effusion, not elsewhere classified: Secondary | ICD-10-CM | POA: Diagnosis not present

## 2022-01-28 DIAGNOSIS — J439 Emphysema, unspecified: Secondary | ICD-10-CM | POA: Diagnosis not present

## 2022-01-28 DIAGNOSIS — R918 Other nonspecific abnormal finding of lung field: Secondary | ICD-10-CM | POA: Diagnosis not present

## 2022-01-28 DIAGNOSIS — R911 Solitary pulmonary nodule: Secondary | ICD-10-CM | POA: Insufficient documentation

## 2022-02-01 NOTE — Progress Notes (Signed)
Cardiology Office Note   Date:  02/03/2022   ID:  Beth [name], Beth [name] 11/15/1940, MRN [id number]  PCP:  Kathyrn Lass, MD    No chief complaint on file.  AFib  Wt Readings from Last 3 Encounters:  02/03/22 247 lb (112 kg)  12/11/21 240 lb (108.9 kg)  11/27/21 239 lb 3.2 oz (108.5 kg)       History of Present Illness: Beth [name] is a 82 y.o. female  Who I saw in  2009  After stroke. 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  Subsequent seizures. Has some word finding issues and cognitive deficits with emotional lability She takes Keppra and Klonopin and is unable to drive, according to prior records.   In 2017, she was seen by Dr. Johnsie Cancel for palpitations.  Echo at that time: "Left ventricle: The cavity size was normal. Wall thickness was   normal. Systolic function was vigorous. The estimated ejection   fraction was in the range of 65% to 70%. Wall motion was normal;   there were no regional wall motion abnormalities. Doppler   parameters are consistent with abnormal left ventricular   relaxation (grade 1 diastolic dysfunction). Doppler parameters   are consistent with high ventricular filling pressure. - Mitral valve: Calcified annulus.   Impressions:   - Vigorous LV systolic function; grade 1 diastolic dysfunction with   elevated LV filling pressure; trace MR and TR."    She is on chronic oxygen for COPD.   New onset AFib in the setting of TCAR in 6/21, for symptomatic right carotid stenosis. Managed by Dr. Scot Dock.  ECG from May 2021 shows A. fib with rapid ventricular response.   Eliquis was added and restarted on postop day 2 after her TCAR.  Clopidogrel  Was stopped.     She has had issues with her COPD.     She had syncope in late 2022.  Zio patch in 2022 showed: "Atrial fibrillation with overall, adequate rate control. Average HR 88 bpm. Rare PVCs that were associated with patient symptoms at times. No significant pauses.      Patch Wear Time:  3 days and 0 hours (2022-11-20T16:00:30-0500 to 2022-11-23T16:00:52-0500)   Atrial Fibrillation occurred continuously (100% burden), ranging from 59-135 bpm (avg of 88 bpm). Isolated VEs were rare (<1.0%), and no VE Couplets or VE Triplets were present. "  She only wore it for 3 days after getting it wet in the shower.    CT in 12/21/20:  "Patchy airspace disease at the lung bases with consolidation in the right lower lobe, possible atelectasis or infiltrate. 2. Scattered pulmonary nodules in the right lower lobe, the largest measuring 1.1 cm and new from the prior exam. Findings are concerning for neoplastic process. PET-CT and/or biopsy is recommended for further evaluation. 3. Coronary artery calcifications. 4. Aortic atherosclerosis. 5. Complex hypodensity in the upper pole the left kidney. Ultrasound is recommended for further evaluation."   She did have a PET scan in February 2023 showing some concern for malignant lung nodule.  Summer 2023, anemia noted and improved off of Eliquis.  It was restarted but Hbg droppe again  Eliquis was stopped for good in 12/23 due to anemia.    She had a fall tripping on oxygen tubing.  Had some bruising on her chest.  Ot took 3 adults to get her up.  Instability on the left side de to prior stroke.   Denies : exertional Chest pain.  Dizziness. Leg edema. Nitroglycerin use. Orthopnea.  Paroxysmal nocturnal dyspnea. Shortness of breath. Syncope.    Uses metoprolol as needed for palpitations 2x/month.   Pain with deep breathing and coughing.  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    Vestibulitis of ear     Past Surgical History:  Procedure Laterality Date   broken ankle Right    IR THORACENTESIS ASP PLEURAL SPACE W/IMG GUIDE  06/18/2021   TONSILLECTOMY     TRANSCAROTID ARTERY REVASCULARIZATION  Right 06/20/2019   TRANSCAROTID ARTERY REVASCULARIZATION  Right 06/20/2019   Procedure: RIGHT TRANSCAROTID ARTERY REVASCULARIZATION;  Surgeon: Angelia Mould, MD;  Location: [facility] OR;  Service: Vascular;  Laterality: Right;     Current Outpatient Medications  Medication Sig Dispense Refill   acetaminophen (TYLENOL) 325 MG tablet Take 1-2 tablets (325-650 mg total) by mouth every 4 (four) hours as needed for mild pain.     ADVAIR HFA 230-21 MCG/ACT inhaler Inhale 2 puffs into the lungs 2 (two) times daily.     Albuterol Sulfate, sensor, (PROAIR DIGIHALER) 108 (90 Base) MCG/ACT AEPB Inhale 2 puffs into the lungs every 4 (four) hours as needed.     aspirin EC 81 MG tablet Take 81 mg by mouth daily.     atorvastatin (LIPITOR) 20 MG tablet TAKE 1 TABLET DAILY 90 tablet 3   azelastine (ASTELIN) 0.1 % nasal spray Place 1 spray into both nostrils 2 (two) times daily. Use in each nostril as directed 30 mL 12   captopril (CAPOTEN) 50 MG tablet Take 50 mg by mouth 2 (two) times daily.     Cholecalciferol (VITAMIN D3) 2000 units capsule Take 2,000 Units by mouth daily.      ferrous sulfate 324 MG TBEC Take 324 mg by mouth daily with breakfast.     fluticasone (FLONASE) 50 MCG/ACT nasal spray Place 1 spray into the nose daily as needed for allergies. Spray twice daily as needed     gabapentin (NEURONTIN) 600 MG tablet TAKE ONE-HALF (1/2) TABLET AT BEDTIME 45 tablet 3   guaiFENesin (MUCINEX) 600 MG 12 hr tablet Take 1,200 mg by mouth 2 (two) times daily.     levETIRAcetam (KEPPRA)  750 MG tablet TAKE 1 TABLET TWICE A DAY 180 tablet 3   loratadine (CLARITIN) 5 MG chewable tablet Chew 1 tablet (5 mg total) by mouth daily. 30 tablet 2   metoprolol tartrate (LOPRESSOR) 25 MG tablet Take 25 mg by mouth 2 (two) times daily as needed.     MICONAZOLE NITRATE EX Apply topically.     nystatin cream (MYCOSTATIN) Apply 1 Application topically 2 (two) times daily.     pantoprazole (PROTONIX) 40 MG tablet Take 40 mg by mouth 2 (two) times daily.     revefenacin (YUPELRI) 175 MCG/3ML nebulizer solution Take 3 mLs (175 mcg total)  by nebulization daily. 21 mL 0   sodium chloride HYPERTONIC 3 % nebulizer solution Take by nebulization in the morning and at bedtime. 75 mL 11   Spacer/Aero-Holding Chambers (AEROCHAMBER PLUS WITH MASK) inhaler As needed with inhaler 1 each 0   vitamin B-12 (CYANOCOBALAMIN) 500 MCG tablet Take 1 tablet (500 mcg total) by mouth daily.     apixaban (ELIQUIS) 5 MG TABS tablet TAKE 1 TABLET TWICE A DAY (Patient not taking: Reported on 02/03/2022) 180 tablet 2   diltiazem (CARDIZEM CD) 360 MG 24 hr capsule TAKE 1 CAPSULE DAILY 90 capsule 3   furosemide (LASIX) 40 MG tablet Take 1 tablet (40 mg total) by mouth daily for 5 days. 5 tablet 0   levofloxacin (LEVAQUIN) 500 MG tablet Take 1 tablet (500 mg total) by mouth daily. (Patient not taking: Reported on 02/03/2022) 3 tablet 0   LEXAPRO 20 MG tablet Take 1 tablet (20 mg total) by mouth daily. Morning dosage (Patient not taking: Reported on 02/03/2022) 90 tablet 3   potassium chloride (KLOR-CON M) 10 MEQ tablet Take 1 tablet (10 mEq total) by mouth daily for 5 days. 5 tablet 0   No current facility-administered medications for this visit.    Allergies:   Prozac [fluoxetine hcl], Sulfa antibiotics, Micardis [telmisartan], Prednisone, Allegra [fexofenadine], Prozac [fluoxetine], Augmentin [amoxicillin-pot clavulanate], Avelox [moxifloxacin hcl in nacl], Lisinopril-hydrochlorothiazide, and Norvasc [amlodipine besylate]     Social History:  The patient  reports that she quit smoking about 16 years ago. Her smoking use included cigarettes. She has a 41.00 pack-year smoking history. She has never used smokeless tobacco. She reports that she does not drink alcohol and does not use drugs.   Family History:  The patient's family history includes Bleeding Disorder in her maternal grandfather; Breast cancer (age of onset: 49) in her daughter; Colon cancer in her maternal aunt; Dementia in her maternal grandfather and mother; Emphysema in her maternal aunt, maternal aunt, and maternal uncle; High blood pressure in her maternal grandmother; Osteoporosis in her mother; Other in her daughter and daughter; Ovarian cancer (age of onset: 66) in her paternal grandmother; Ovarian cancer (age of onset: 84) in her paternal aunt; Stroke in her mother; Thyroid disease in her mother.    ROS:  Please see the history of present illness.   Otherwise, review of systems are positive for DOE- chronic.   All other systems are reviewed and negative.    PHYSICAL EXAM: VS:  BP 128/74   Pulse 88   Ht '5\' 4"'$  (1.626 m)   Wt 247 lb (112 kg)   LMP 01/14/1995 (Approximate)   SpO2 97%   BMI 42.40 kg/m  , BMI Body mass index is 42.4 kg/m. GEN: Well nourished, well developed, in no acute distress HEENT: normal Neck: no JVD, carotid bruits, or masses Cardiac: irregularly irregular; no murmurs, rubs, or gallops,no edema  Respiratory:  clear to auscultation bilaterally, normal work of breathing GI: soft, nontender, nondistended, + BS MS: no deformity or atrophy; bruise on left chest Skin: warm and dry, no rash Neuro:  in a wheelchair Psych: euthymic mood, full affect   EKG:   The ekg ordered today demonstrates AFib, rate controlled.   Recent Labs: 11/25/2021: ALT 15; BUN 11; Creatinine, Ser 0.61; Hemoglobin 9.7; Platelets 339; Potassium 4.4; Sodium 140   Lipid Panel    Component Value Date/Time   CHOL 151 05/17/2019 0346   TRIG 172  (H) 05/17/2019 0346   HDL 39 (L) 05/17/2019 0346  CHOLHDL 3.9 05/17/2019 0346   VLDL 34 05/17/2019 0346   LDLCALC 78 05/17/2019 0346     Other studies Reviewed: Additional studies/ records that were reviewed today with results demonstrating: labs reviewed.   ASSESSMENT AND PLAN:  Atrial fibrillation: Eliquis for stroke management in the past but this was stopped for anemia.  Has had ICH in the past so high risk for anticoagulation.  Bruising noted on her left side from a fall.  Need to rule out rib fracture.  Will order x-rays. Hypertension: The current medical regimen is effective;  continue present plan and medications. Lower extremity edema: Elevate legs.  Can use lasix more frequently on occasion (daily for a few days).  Hyperlipidemia: Carotid disease. Lipids well controlled. Continue atorvastatin.  Total cholesterol 146, HDL 56, LDL 73, triglycerides 92 in May 2023 PAD:  Does not walk long distances.   Anticoagulated: Be careful to avoid falls.  Needs to use a walker at home.      Current medicines are reviewed at length with the patient today.  The patient concerns regarding her medicines were addressed.  The following changes have been made:  No change  Labs/ tests ordered today include:   Orders Placed This Encounter  Procedures   DG Chest 2 View    Recommend 150 minutes/week of aerobic exercise Low fat, low carb, high fiber diet recommended  Disposition:   FU in 1 year   Signed, Larae Grooms, MD  02/03/2022 2:37 PM    [facility] [address], [place], [place]  [address] Phone: [phone]; Fax: [phone]

## 2022-02-03 ENCOUNTER — Other Ambulatory Visit: Payer: Self-pay | Admitting: Pulmonary Disease

## 2022-02-03 ENCOUNTER — Encounter: Payer: Self-pay | Admitting: Interventional Cardiology

## 2022-02-03 ENCOUNTER — Ambulatory Visit
Admission: RE | Admit: 2022-02-03 | Discharge: 2022-02-03 | Disposition: A | Discharge status: Home / Self Care | Payer: Medicare Other | Source: Ambulatory Visit | Attending: Interventional Cardiology | Admitting: Interventional Cardiology

## 2022-02-03 ENCOUNTER — Ambulatory Visit (INDEPENDENT_AMBULATORY_CARE_PROVIDER_SITE_OTHER): Payer: Medicare Other | Admitting: Interventional Cardiology

## 2022-02-03 VITALS — BP 128/74 | HR 88 | Ht 64.0 in | Wt 247.0 lb

## 2022-02-03 DIAGNOSIS — I4821 Permanent atrial fibrillation: Secondary | ICD-10-CM | POA: Insufficient documentation

## 2022-02-03 DIAGNOSIS — I4891 Unspecified atrial fibrillation: Secondary | ICD-10-CM | POA: Diagnosis not present

## 2022-02-03 DIAGNOSIS — Z7901 Long term (current) use of anticoagulants: Secondary | ICD-10-CM

## 2022-02-03 DIAGNOSIS — R0781 Pleurodynia: Secondary | ICD-10-CM | POA: Insufficient documentation

## 2022-02-03 DIAGNOSIS — R911 Solitary pulmonary nodule: Secondary | ICD-10-CM | POA: Diagnosis not present

## 2022-02-03 DIAGNOSIS — I2781 Cor pulmonale (chronic): Secondary | ICD-10-CM

## 2022-02-03 DIAGNOSIS — I6521 Occlusion and stenosis of right carotid artery: Secondary | ICD-10-CM | POA: Diagnosis not present

## 2022-02-03 DIAGNOSIS — I609 Nontraumatic subarachnoid hemorrhage, unspecified: Secondary | ICD-10-CM | POA: Insufficient documentation

## 2022-02-03 DIAGNOSIS — I739 Peripheral vascular disease, unspecified: Secondary | ICD-10-CM | POA: Insufficient documentation

## 2022-02-03 DIAGNOSIS — J90 Pleural effusion, not elsewhere classified: Secondary | ICD-10-CM | POA: Diagnosis not present

## 2022-02-03 DIAGNOSIS — I10 Essential (primary) hypertension: Secondary | ICD-10-CM | POA: Insufficient documentation

## 2022-02-03 DIAGNOSIS — E782 Mixed hyperlipidemia: Secondary | ICD-10-CM | POA: Diagnosis not present

## 2022-02-03 MED ORDER — DILTIAZEM HCL ER COATED BEADS 360 MG PO CP24: ORAL_CAPSULE | ORAL | 3 refills | Status: AC

## 2022-02-03 NOTE — Patient Instructions (Addendum)
Medication Instructions:  Your physician recommends that you continue on your current medications as directed. Please refer to the Current Medication list given to you today.  *If you need a refill on your cardiac medications before your next appointment, please call your pharmacy*   Lab Work: none If you have labs (blood work) drawn today and your tests are completely normal, you will receive your results only by: [place] (if you have MyChart) OR A paper copy in the mail If you have any lab test that is abnormal or we need to change your treatment, we will call you to review the results.   Testing/Procedures: Have Chest X ray done at [place]: At [facility], you and your health needs are our priority.  As part of our continuing mission to provide you with exceptional heart care, we have created designated Provider Care Teams.  These Care Teams include your primary Cardiologist (physician) and Advanced Practice Providers (APPs -  Physician Assistants and Nurse Practitioners) who all work together to provide you with the care you need, when you need it.  We recommend signing up for the patient portal called "MyChart".  Sign up information is provided on this After Visit Summary.  MyChart is used to connect with patients for Virtual Visits (Telemedicine).  Patients are able to view lab/test results, encounter notes, upcoming appointments, etc.  Non-urgent messages can be sent to your provider as well.   To learn more about what you can do with MyChart, go to [email or web address].    Your next appointment:   12 month(s)  Provider:   Larae Grooms, MD     Other Instructions

## 2022-02-05 NOTE — Addendum Note (Signed)
Addended by: Sharee Holster R on: 02/05/2022 08:29 AM   Modules accepted: Orders

## 2022-02-10 ENCOUNTER — Other Ambulatory Visit: Payer: Self-pay | Admitting: Vascular Surgery

## 2022-02-13 ENCOUNTER — Other Ambulatory Visit: Payer: Self-pay | Admitting: *Deleted

## 2022-02-13 MED ORDER — AZELASTINE HCL 0.1 % NA SOLN: 1.0000 | Freq: Two times a day (BID) | NASAL | 3 refills | Status: DC

## 2022-03-03 DIAGNOSIS — D508 Other iron deficiency anemias: Secondary | ICD-10-CM | POA: Diagnosis not present

## 2022-03-03 DIAGNOSIS — R7303 Prediabetes: Secondary | ICD-10-CM | POA: Diagnosis not present

## 2022-03-03 DIAGNOSIS — E7849 Other hyperlipidemia: Secondary | ICD-10-CM | POA: Diagnosis not present

## 2022-03-03 DIAGNOSIS — I10 Essential (primary) hypertension: Secondary | ICD-10-CM | POA: Diagnosis not present

## 2022-03-03 DIAGNOSIS — J449 Chronic obstructive pulmonary disease, unspecified: Secondary | ICD-10-CM | POA: Diagnosis not present

## 2022-03-03 DIAGNOSIS — J9611 Chronic respiratory failure with hypoxia: Secondary | ICD-10-CM | POA: Diagnosis not present

## 2022-03-03 DIAGNOSIS — R600 Localized edema: Secondary | ICD-10-CM | POA: Diagnosis not present

## 2022-03-04 ENCOUNTER — Ambulatory Visit (INDEPENDENT_AMBULATORY_CARE_PROVIDER_SITE_OTHER): Payer: Medicare Other | Admitting: Pulmonary Disease

## 2022-03-04 ENCOUNTER — Encounter (HOSPITAL_BASED_OUTPATIENT_CLINIC_OR_DEPARTMENT_OTHER): Payer: Self-pay | Admitting: Pulmonary Disease

## 2022-03-04 VITALS — BP 130/60 | HR 88 | Temp 98.1°F | Ht 64.0 in | Wt 239.0 lb

## 2022-03-04 DIAGNOSIS — I2781 Cor pulmonale (chronic): Secondary | ICD-10-CM

## 2022-03-04 DIAGNOSIS — I6521 Occlusion and stenosis of right carotid artery: Secondary | ICD-10-CM | POA: Diagnosis not present

## 2022-03-04 DIAGNOSIS — J9611 Chronic respiratory failure with hypoxia: Secondary | ICD-10-CM | POA: Diagnosis not present

## 2022-03-04 DIAGNOSIS — R911 Solitary pulmonary nodule: Secondary | ICD-10-CM | POA: Diagnosis not present

## 2022-03-04 DIAGNOSIS — J441 Chronic obstructive pulmonary disease with (acute) exacerbation: Secondary | ICD-10-CM

## 2022-03-04 DIAGNOSIS — J9612 Chronic respiratory failure with hypercapnia: Secondary | ICD-10-CM | POA: Diagnosis not present

## 2022-03-04 NOTE — Assessment & Plan Note (Signed)
Enlarging right lower lobe nodule.  This could not be characterized on PET scan in 2023 since there was an overlapping consolidation and right effusion. We discussed that should this be malignancy, she would not be a candidate for biopsy but we could possibly consider empiric radiation.  The risk with radiation would still remain off that worsening scarring and worsening respiratory failure which would only do this if he was absolutely convinced that this enlarging right lower lobe nodule is cancer.  It could be slow-growing malignancy, we will assess with PET scan in 6 months

## 2022-03-04 NOTE — Progress Notes (Signed)
Subjective:    Patient ID: Beth [name], female    DOB: [birth year], 82 y.o.   MRN: [id number]  HPI  82 y.o. former smoker for FU of COPD & chronic respiratory failure  -Enlarging right lower lobe nodule since 2020   PMH :  HTN, organic brain syndrome, stroke, BRCA2 positive, Obesity. 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  Subsequent seizures. Has some word finding issues and cognitive deficits with emotional lability   -BRCA positive after her daughter was diagnosed with breast cancer     She was treated in February for right lower lobe consolidation. She developed left lower lobe consolidation 05/2021 and received another round of Levaquin , then developed a small effusion. 06/18/21 150 cc of thoracentesis performed , predominantly lymphocytic exudate, cytology negative     10/2021 OV > Bipedal edema - could be r/t cardizem or cor pulmonale due to severe hypoxia.  lasix x 10 ds    ED visit  11/2021 COPD exacerbation with small effusions   OV 11/27/21 Rx for  acute sinusitis and UTIwith empiric Levaquin, and prednisone taper.    Chief Complaint  Patient presents with   Follow-up    Doesn't feel well now, she fell   O2 was turned off accidentally on arrival, sat 83% RA She is in a wheelchair, accompanied by her daughter. Interim history-husband has Alzheimer's and is still driving, she is very stressed. Cardiology office visit reviewed, apixaban has been stopped.  She had an accidental fall, x-rays did not show any rib fractures Breathing is slightly worse, she remains on Advair and Yupelri combination She reports bipedal edema, weight is unchanged  We reviewed CT chest   Significant tests/ events reviewed   CT chest without contrast 01/2022 -slight enlargement of right lower lobe nodule 15 x 17 mm, left effusion has resolved with mild scarring   CT chest without con 07/2021  unchanged right lower lobe 1.4 cm nodule,, small left effusion, left lower lobe nodule not noted, left lower lobe consolidation resolved, evidence of old calcified granulomas   CT chest 05/2021 right lower lobe consolidation overall, right lower lobe nodule unchanged, left lower lobe consolidation/small effusion, left lower lobe nodule not noted   02/2021 PET >> right lower lobe consolidation , right lower lobe nodule not visualized, left lower lobe 1.1 cm nodule hypermetabolic, mediastinal lymph nodes hypermetabolic [id number] HRCT was obtained on 02/20/2021 which showed that right lower lobe consolidation had resolved with minimal scarring, no ILD was noted, there was 1 enlarging 1.4 x 1.2 cm nodule.   12/2020 CT chest without contrast 12/9 - bronchiectasis at the bases likely related to mucoid impaction   12/2018 CT chest Scattered pulmonary nodules are seen in the right lower lobe, the largest measuring up to 1.1 cm in diameter.      CT angio chest 07/2015 >> no pulmonary embolism, no emphysema, calcified mediastinal LNs PFTs 09/2015>> severe airway obstruction with FEV1 40%, improves to 47% with albuterol, DLCO decreased to 45%   Review of Systems neg for any significant sore throat, dysphagia, itching, sneezing, nasal congestion or excess/ purulent secretions, fever, chills, sweats, unintended wt loss, pleuritic or exertional cp, hempoptysis, orthopnea pnd or change in chronic leg swelling. Also denies presyncope, palpitations, heartburn, abdominal pain, nausea, vomiting, diarrhea or change in bowel or urinary habits, dysuria,hematuria, rash, arthralgias, visual complaints, headache, numbness weakness or ataxia.  Objective:   Physical Exam  Gen. Pleasant, obese, in no distress ENT - no lesions, no post nasal drip Neck: No JVD, no thyromegaly, no carotid bruits Lungs: no use of accessory muscles, no dullness to percussion, decreased without rales or rhonchi  Cardiovascular: Rhythm regular,  heart sounds  normal, no murmurs or gallops, 1+ peripheral edema Musculoskeletal: No deformities, no cyanosis or clubbing , no tremors        Assessment & Plan:

## 2022-03-04 NOTE — Assessment & Plan Note (Signed)
Lasix 40 mg daily. Use oxygen at all times

## 2022-03-04 NOTE — Patient Instructions (Addendum)
  X PET scan 6 months for slow growing Right nodule

## 2022-03-04 NOTE — Assessment & Plan Note (Signed)
Continue Advair and Yupelri combination. Use albuterol for rescue

## 2022-03-04 NOTE — Assessment & Plan Note (Addendum)
Continue 3 to 4 L of oxygen at all times Outpatient palliative care referral

## 2022-03-05 ENCOUNTER — Telehealth: Payer: Self-pay

## 2022-03-05 DIAGNOSIS — Z7982 Long term (current) use of aspirin: Secondary | ICD-10-CM | POA: Diagnosis not present

## 2022-03-05 DIAGNOSIS — Z9181 History of falling: Secondary | ICD-10-CM | POA: Diagnosis not present

## 2022-03-05 DIAGNOSIS — Z87891 Personal history of nicotine dependence: Secondary | ICD-10-CM | POA: Diagnosis not present

## 2022-03-05 DIAGNOSIS — D508 Other iron deficiency anemias: Secondary | ICD-10-CM | POA: Diagnosis not present

## 2022-03-05 DIAGNOSIS — I10 Essential (primary) hypertension: Secondary | ICD-10-CM | POA: Diagnosis not present

## 2022-03-05 DIAGNOSIS — Z9981 Dependence on supplemental oxygen: Secondary | ICD-10-CM | POA: Diagnosis not present

## 2022-03-05 DIAGNOSIS — E7849 Other hyperlipidemia: Secondary | ICD-10-CM | POA: Diagnosis not present

## 2022-03-05 DIAGNOSIS — J449 Chronic obstructive pulmonary disease, unspecified: Secondary | ICD-10-CM | POA: Diagnosis not present

## 2022-03-05 DIAGNOSIS — R600 Localized edema: Secondary | ICD-10-CM | POA: Diagnosis not present

## 2022-03-05 DIAGNOSIS — J9611 Chronic respiratory failure with hypoxia: Secondary | ICD-10-CM | POA: Diagnosis not present

## 2022-03-05 NOTE — Telephone Encounter (Signed)
(  4:55 pm) PC SW completed call to patient's daughter [name] to discuss palliative care referral and schedule a visit. [name] advised that she was already contacted by another palliative care agency,who is scheduled to see patient tomorrow. SW advised her that if she needed ACC in the future, don't hesitate to call us back.  Patient moved to inactive status.

## 2022-03-07 DIAGNOSIS — E7849 Other hyperlipidemia: Secondary | ICD-10-CM | POA: Diagnosis not present

## 2022-03-07 DIAGNOSIS — I10 Essential (primary) hypertension: Secondary | ICD-10-CM | POA: Diagnosis not present

## 2022-03-07 DIAGNOSIS — D508 Other iron deficiency anemias: Secondary | ICD-10-CM | POA: Diagnosis not present

## 2022-03-07 DIAGNOSIS — J9611 Chronic respiratory failure with hypoxia: Secondary | ICD-10-CM | POA: Diagnosis not present

## 2022-03-07 DIAGNOSIS — J449 Chronic obstructive pulmonary disease, unspecified: Secondary | ICD-10-CM | POA: Diagnosis not present

## 2022-03-07 DIAGNOSIS — R600 Localized edema: Secondary | ICD-10-CM | POA: Diagnosis not present

## 2022-03-10 DIAGNOSIS — J449 Chronic obstructive pulmonary disease, unspecified: Secondary | ICD-10-CM | POA: Diagnosis not present

## 2022-03-10 DIAGNOSIS — E7849 Other hyperlipidemia: Secondary | ICD-10-CM | POA: Diagnosis not present

## 2022-03-10 DIAGNOSIS — I10 Essential (primary) hypertension: Secondary | ICD-10-CM | POA: Diagnosis not present

## 2022-03-10 DIAGNOSIS — J9611 Chronic respiratory failure with hypoxia: Secondary | ICD-10-CM | POA: Diagnosis not present

## 2022-03-10 DIAGNOSIS — R600 Localized edema: Secondary | ICD-10-CM | POA: Diagnosis not present

## 2022-03-10 DIAGNOSIS — D508 Other iron deficiency anemias: Secondary | ICD-10-CM | POA: Diagnosis not present

## 2022-03-12 DIAGNOSIS — J449 Chronic obstructive pulmonary disease, unspecified: Secondary | ICD-10-CM | POA: Diagnosis not present

## 2022-03-12 DIAGNOSIS — R600 Localized edema: Secondary | ICD-10-CM | POA: Diagnosis not present

## 2022-03-12 DIAGNOSIS — E7849 Other hyperlipidemia: Secondary | ICD-10-CM | POA: Diagnosis not present

## 2022-03-12 DIAGNOSIS — I10 Essential (primary) hypertension: Secondary | ICD-10-CM | POA: Diagnosis not present

## 2022-03-12 DIAGNOSIS — J9611 Chronic respiratory failure with hypoxia: Secondary | ICD-10-CM | POA: Diagnosis not present

## 2022-03-12 DIAGNOSIS — D508 Other iron deficiency anemias: Secondary | ICD-10-CM | POA: Diagnosis not present

## 2022-03-19 ENCOUNTER — Other Ambulatory Visit: Payer: Self-pay | Admitting: Primary Care

## 2022-03-21 DIAGNOSIS — R600 Localized edema: Secondary | ICD-10-CM | POA: Diagnosis not present

## 2022-03-21 DIAGNOSIS — E7849 Other hyperlipidemia: Secondary | ICD-10-CM | POA: Diagnosis not present

## 2022-03-21 DIAGNOSIS — J9611 Chronic respiratory failure with hypoxia: Secondary | ICD-10-CM | POA: Diagnosis not present

## 2022-03-21 DIAGNOSIS — I10 Essential (primary) hypertension: Secondary | ICD-10-CM | POA: Diagnosis not present

## 2022-03-21 DIAGNOSIS — J449 Chronic obstructive pulmonary disease, unspecified: Secondary | ICD-10-CM | POA: Diagnosis not present

## 2022-03-21 DIAGNOSIS — D508 Other iron deficiency anemias: Secondary | ICD-10-CM | POA: Diagnosis not present

## 2022-03-25 DIAGNOSIS — D508 Other iron deficiency anemias: Secondary | ICD-10-CM | POA: Diagnosis not present

## 2022-03-25 DIAGNOSIS — E7849 Other hyperlipidemia: Secondary | ICD-10-CM | POA: Diagnosis not present

## 2022-03-25 DIAGNOSIS — I10 Essential (primary) hypertension: Secondary | ICD-10-CM | POA: Diagnosis not present

## 2022-03-25 DIAGNOSIS — J449 Chronic obstructive pulmonary disease, unspecified: Secondary | ICD-10-CM | POA: Diagnosis not present

## 2022-03-25 DIAGNOSIS — J9611 Chronic respiratory failure with hypoxia: Secondary | ICD-10-CM | POA: Diagnosis not present

## 2022-03-25 DIAGNOSIS — R600 Localized edema: Secondary | ICD-10-CM | POA: Diagnosis not present

## 2022-04-01 DIAGNOSIS — J9611 Chronic respiratory failure with hypoxia: Secondary | ICD-10-CM | POA: Diagnosis not present

## 2022-04-01 DIAGNOSIS — R600 Localized edema: Secondary | ICD-10-CM | POA: Diagnosis not present

## 2022-04-01 DIAGNOSIS — I10 Essential (primary) hypertension: Secondary | ICD-10-CM | POA: Diagnosis not present

## 2022-04-01 DIAGNOSIS — D508 Other iron deficiency anemias: Secondary | ICD-10-CM | POA: Diagnosis not present

## 2022-04-01 DIAGNOSIS — J449 Chronic obstructive pulmonary disease, unspecified: Secondary | ICD-10-CM | POA: Diagnosis not present

## 2022-04-01 DIAGNOSIS — E7849 Other hyperlipidemia: Secondary | ICD-10-CM | POA: Diagnosis not present

## 2022-04-02 DIAGNOSIS — D508 Other iron deficiency anemias: Secondary | ICD-10-CM | POA: Diagnosis not present

## 2022-04-02 DIAGNOSIS — J449 Chronic obstructive pulmonary disease, unspecified: Secondary | ICD-10-CM | POA: Diagnosis not present

## 2022-04-02 DIAGNOSIS — E7849 Other hyperlipidemia: Secondary | ICD-10-CM | POA: Diagnosis not present

## 2022-04-02 DIAGNOSIS — I10 Essential (primary) hypertension: Secondary | ICD-10-CM | POA: Diagnosis not present

## 2022-04-02 DIAGNOSIS — J9611 Chronic respiratory failure with hypoxia: Secondary | ICD-10-CM | POA: Diagnosis not present

## 2022-04-02 DIAGNOSIS — R600 Localized edema: Secondary | ICD-10-CM | POA: Diagnosis not present

## 2022-04-04 DIAGNOSIS — Z9981 Dependence on supplemental oxygen: Secondary | ICD-10-CM | POA: Diagnosis not present

## 2022-04-04 DIAGNOSIS — J449 Chronic obstructive pulmonary disease, unspecified: Secondary | ICD-10-CM | POA: Diagnosis not present

## 2022-04-04 DIAGNOSIS — J9611 Chronic respiratory failure with hypoxia: Secondary | ICD-10-CM | POA: Diagnosis not present

## 2022-04-04 DIAGNOSIS — R600 Localized edema: Secondary | ICD-10-CM | POA: Diagnosis not present

## 2022-04-04 DIAGNOSIS — D508 Other iron deficiency anemias: Secondary | ICD-10-CM | POA: Diagnosis not present

## 2022-04-04 DIAGNOSIS — Z87891 Personal history of nicotine dependence: Secondary | ICD-10-CM | POA: Diagnosis not present

## 2022-04-04 DIAGNOSIS — I10 Essential (primary) hypertension: Secondary | ICD-10-CM | POA: Diagnosis not present

## 2022-04-04 DIAGNOSIS — E7849 Other hyperlipidemia: Secondary | ICD-10-CM | POA: Diagnosis not present

## 2022-04-04 DIAGNOSIS — Z7982 Long term (current) use of aspirin: Secondary | ICD-10-CM | POA: Diagnosis not present

## 2022-04-04 DIAGNOSIS — Z9181 History of falling: Secondary | ICD-10-CM | POA: Diagnosis not present

## 2022-04-10 DIAGNOSIS — J449 Chronic obstructive pulmonary disease, unspecified: Secondary | ICD-10-CM | POA: Diagnosis not present

## 2022-04-10 DIAGNOSIS — I10 Essential (primary) hypertension: Secondary | ICD-10-CM | POA: Diagnosis not present

## 2022-04-10 DIAGNOSIS — E7849 Other hyperlipidemia: Secondary | ICD-10-CM | POA: Diagnosis not present

## 2022-04-10 DIAGNOSIS — J9611 Chronic respiratory failure with hypoxia: Secondary | ICD-10-CM | POA: Diagnosis not present

## 2022-04-10 DIAGNOSIS — R600 Localized edema: Secondary | ICD-10-CM | POA: Diagnosis not present

## 2022-04-10 DIAGNOSIS — D508 Other iron deficiency anemias: Secondary | ICD-10-CM | POA: Diagnosis not present

## 2022-04-11 DIAGNOSIS — D508 Other iron deficiency anemias: Secondary | ICD-10-CM | POA: Diagnosis not present

## 2022-04-11 DIAGNOSIS — E7849 Other hyperlipidemia: Secondary | ICD-10-CM | POA: Diagnosis not present

## 2022-04-11 DIAGNOSIS — J9611 Chronic respiratory failure with hypoxia: Secondary | ICD-10-CM | POA: Diagnosis not present

## 2022-04-11 DIAGNOSIS — R600 Localized edema: Secondary | ICD-10-CM | POA: Diagnosis not present

## 2022-04-11 DIAGNOSIS — I10 Essential (primary) hypertension: Secondary | ICD-10-CM | POA: Diagnosis not present

## 2022-04-11 DIAGNOSIS — J449 Chronic obstructive pulmonary disease, unspecified: Secondary | ICD-10-CM | POA: Diagnosis not present

## 2022-04-16 DIAGNOSIS — I10 Essential (primary) hypertension: Secondary | ICD-10-CM | POA: Diagnosis not present

## 2022-04-16 DIAGNOSIS — R600 Localized edema: Secondary | ICD-10-CM | POA: Diagnosis not present

## 2022-04-16 DIAGNOSIS — J9611 Chronic respiratory failure with hypoxia: Secondary | ICD-10-CM | POA: Diagnosis not present

## 2022-04-16 DIAGNOSIS — E7849 Other hyperlipidemia: Secondary | ICD-10-CM | POA: Diagnosis not present

## 2022-04-16 DIAGNOSIS — J449 Chronic obstructive pulmonary disease, unspecified: Secondary | ICD-10-CM | POA: Diagnosis not present

## 2022-04-16 DIAGNOSIS — D508 Other iron deficiency anemias: Secondary | ICD-10-CM | POA: Diagnosis not present

## 2022-04-18 DIAGNOSIS — J9611 Chronic respiratory failure with hypoxia: Secondary | ICD-10-CM | POA: Diagnosis not present

## 2022-04-18 DIAGNOSIS — I10 Essential (primary) hypertension: Secondary | ICD-10-CM | POA: Diagnosis not present

## 2022-04-18 DIAGNOSIS — E7849 Other hyperlipidemia: Secondary | ICD-10-CM | POA: Diagnosis not present

## 2022-04-18 DIAGNOSIS — R600 Localized edema: Secondary | ICD-10-CM | POA: Diagnosis not present

## 2022-04-18 DIAGNOSIS — D508 Other iron deficiency anemias: Secondary | ICD-10-CM | POA: Diagnosis not present

## 2022-04-18 DIAGNOSIS — J449 Chronic obstructive pulmonary disease, unspecified: Secondary | ICD-10-CM | POA: Diagnosis not present

## 2022-04-22 DIAGNOSIS — D508 Other iron deficiency anemias: Secondary | ICD-10-CM | POA: Diagnosis not present

## 2022-04-22 DIAGNOSIS — R600 Localized edema: Secondary | ICD-10-CM | POA: Diagnosis not present

## 2022-04-22 DIAGNOSIS — I10 Essential (primary) hypertension: Secondary | ICD-10-CM | POA: Diagnosis not present

## 2022-04-22 DIAGNOSIS — E7849 Other hyperlipidemia: Secondary | ICD-10-CM | POA: Diagnosis not present

## 2022-04-22 DIAGNOSIS — J449 Chronic obstructive pulmonary disease, unspecified: Secondary | ICD-10-CM | POA: Diagnosis not present

## 2022-04-22 DIAGNOSIS — J9611 Chronic respiratory failure with hypoxia: Secondary | ICD-10-CM | POA: Diagnosis not present

## 2022-04-24 DIAGNOSIS — R600 Localized edema: Secondary | ICD-10-CM | POA: Diagnosis not present

## 2022-04-24 DIAGNOSIS — J9611 Chronic respiratory failure with hypoxia: Secondary | ICD-10-CM | POA: Diagnosis not present

## 2022-04-24 DIAGNOSIS — I10 Essential (primary) hypertension: Secondary | ICD-10-CM | POA: Diagnosis not present

## 2022-04-24 DIAGNOSIS — D508 Other iron deficiency anemias: Secondary | ICD-10-CM | POA: Diagnosis not present

## 2022-04-24 DIAGNOSIS — E7849 Other hyperlipidemia: Secondary | ICD-10-CM | POA: Diagnosis not present

## 2022-04-24 DIAGNOSIS — J449 Chronic obstructive pulmonary disease, unspecified: Secondary | ICD-10-CM | POA: Diagnosis not present

## 2022-04-25 DIAGNOSIS — E7849 Other hyperlipidemia: Secondary | ICD-10-CM | POA: Diagnosis not present

## 2022-04-25 DIAGNOSIS — R600 Localized edema: Secondary | ICD-10-CM | POA: Diagnosis not present

## 2022-04-25 DIAGNOSIS — I10 Essential (primary) hypertension: Secondary | ICD-10-CM | POA: Diagnosis not present

## 2022-04-25 DIAGNOSIS — J449 Chronic obstructive pulmonary disease, unspecified: Secondary | ICD-10-CM | POA: Diagnosis not present

## 2022-04-25 DIAGNOSIS — J9611 Chronic respiratory failure with hypoxia: Secondary | ICD-10-CM | POA: Diagnosis not present

## 2022-04-25 DIAGNOSIS — D508 Other iron deficiency anemias: Secondary | ICD-10-CM | POA: Diagnosis not present

## 2022-04-25 DIAGNOSIS — R300 Dysuria: Secondary | ICD-10-CM | POA: Diagnosis not present

## 2022-04-25 DIAGNOSIS — Z87440 Personal history of urinary (tract) infections: Secondary | ICD-10-CM | POA: Diagnosis not present

## 2022-04-28 DIAGNOSIS — E7849 Other hyperlipidemia: Secondary | ICD-10-CM | POA: Diagnosis not present

## 2022-04-28 DIAGNOSIS — J9611 Chronic respiratory failure with hypoxia: Secondary | ICD-10-CM | POA: Diagnosis not present

## 2022-04-28 DIAGNOSIS — J449 Chronic obstructive pulmonary disease, unspecified: Secondary | ICD-10-CM | POA: Diagnosis not present

## 2022-04-28 DIAGNOSIS — D508 Other iron deficiency anemias: Secondary | ICD-10-CM | POA: Diagnosis not present

## 2022-04-28 DIAGNOSIS — R600 Localized edema: Secondary | ICD-10-CM | POA: Diagnosis not present

## 2022-04-28 DIAGNOSIS — I10 Essential (primary) hypertension: Secondary | ICD-10-CM | POA: Diagnosis not present

## 2022-05-01 DIAGNOSIS — J449 Chronic obstructive pulmonary disease, unspecified: Secondary | ICD-10-CM | POA: Diagnosis not present

## 2022-05-01 DIAGNOSIS — E7849 Other hyperlipidemia: Secondary | ICD-10-CM | POA: Diagnosis not present

## 2022-05-01 DIAGNOSIS — D508 Other iron deficiency anemias: Secondary | ICD-10-CM | POA: Diagnosis not present

## 2022-05-01 DIAGNOSIS — I10 Essential (primary) hypertension: Secondary | ICD-10-CM | POA: Diagnosis not present

## 2022-05-01 DIAGNOSIS — R600 Localized edema: Secondary | ICD-10-CM | POA: Diagnosis not present

## 2022-05-01 DIAGNOSIS — J9611 Chronic respiratory failure with hypoxia: Secondary | ICD-10-CM | POA: Diagnosis not present

## 2022-05-04 DIAGNOSIS — Z9181 History of falling: Secondary | ICD-10-CM | POA: Diagnosis not present

## 2022-05-04 DIAGNOSIS — Z7982 Long term (current) use of aspirin: Secondary | ICD-10-CM | POA: Diagnosis not present

## 2022-05-04 DIAGNOSIS — E7849 Other hyperlipidemia: Secondary | ICD-10-CM | POA: Diagnosis not present

## 2022-05-04 DIAGNOSIS — J449 Chronic obstructive pulmonary disease, unspecified: Secondary | ICD-10-CM | POA: Diagnosis not present

## 2022-05-04 DIAGNOSIS — J9611 Chronic respiratory failure with hypoxia: Secondary | ICD-10-CM | POA: Diagnosis not present

## 2022-05-04 DIAGNOSIS — Z9981 Dependence on supplemental oxygen: Secondary | ICD-10-CM | POA: Diagnosis not present

## 2022-05-04 DIAGNOSIS — Z87891 Personal history of nicotine dependence: Secondary | ICD-10-CM | POA: Diagnosis not present

## 2022-05-04 DIAGNOSIS — Z7951 Long term (current) use of inhaled steroids: Secondary | ICD-10-CM | POA: Diagnosis not present

## 2022-05-04 DIAGNOSIS — I10 Essential (primary) hypertension: Secondary | ICD-10-CM | POA: Diagnosis not present

## 2022-05-04 DIAGNOSIS — D508 Other iron deficiency anemias: Secondary | ICD-10-CM | POA: Diagnosis not present

## 2022-05-09 DIAGNOSIS — E7849 Other hyperlipidemia: Secondary | ICD-10-CM | POA: Diagnosis not present

## 2022-05-09 DIAGNOSIS — J9611 Chronic respiratory failure with hypoxia: Secondary | ICD-10-CM | POA: Diagnosis not present

## 2022-05-09 DIAGNOSIS — J449 Chronic obstructive pulmonary disease, unspecified: Secondary | ICD-10-CM | POA: Diagnosis not present

## 2022-05-09 DIAGNOSIS — I10 Essential (primary) hypertension: Secondary | ICD-10-CM | POA: Diagnosis not present

## 2022-05-09 DIAGNOSIS — D508 Other iron deficiency anemias: Secondary | ICD-10-CM | POA: Diagnosis not present

## 2022-05-09 DIAGNOSIS — Z9181 History of falling: Secondary | ICD-10-CM | POA: Diagnosis not present

## 2022-05-16 DIAGNOSIS — J9611 Chronic respiratory failure with hypoxia: Secondary | ICD-10-CM | POA: Diagnosis not present

## 2022-05-16 DIAGNOSIS — J449 Chronic obstructive pulmonary disease, unspecified: Secondary | ICD-10-CM | POA: Diagnosis not present

## 2022-05-16 DIAGNOSIS — D508 Other iron deficiency anemias: Secondary | ICD-10-CM | POA: Diagnosis not present

## 2022-05-16 DIAGNOSIS — E7849 Other hyperlipidemia: Secondary | ICD-10-CM | POA: Diagnosis not present

## 2022-05-16 DIAGNOSIS — Z9181 History of falling: Secondary | ICD-10-CM | POA: Diagnosis not present

## 2022-05-16 DIAGNOSIS — I10 Essential (primary) hypertension: Secondary | ICD-10-CM | POA: Diagnosis not present

## 2022-05-20 ENCOUNTER — Ambulatory Visit: Payer: Medicare Other | Admitting: Neurology

## 2022-05-22 ENCOUNTER — Encounter (HOSPITAL_BASED_OUTPATIENT_CLINIC_OR_DEPARTMENT_OTHER): Payer: Self-pay | Admitting: Pulmonary Disease

## 2022-05-22 ENCOUNTER — Ambulatory Visit (INDEPENDENT_AMBULATORY_CARE_PROVIDER_SITE_OTHER): Payer: Medicare Other | Admitting: Pulmonary Disease

## 2022-05-22 VITALS — BP 126/82 | HR 102 | Temp 99.2°F | Ht 64.0 in | Wt 239.2 lb

## 2022-05-22 DIAGNOSIS — R911 Solitary pulmonary nodule: Secondary | ICD-10-CM

## 2022-05-22 DIAGNOSIS — J9612 Chronic respiratory failure with hypercapnia: Secondary | ICD-10-CM

## 2022-05-22 DIAGNOSIS — Z515 Encounter for palliative care: Secondary | ICD-10-CM | POA: Diagnosis not present

## 2022-05-22 DIAGNOSIS — J441 Chronic obstructive pulmonary disease with (acute) exacerbation: Secondary | ICD-10-CM | POA: Diagnosis not present

## 2022-05-22 DIAGNOSIS — J9611 Chronic respiratory failure with hypoxia: Secondary | ICD-10-CM | POA: Diagnosis not present

## 2022-05-22 NOTE — Assessment & Plan Note (Signed)
We discussed that enlarging right lower lobe nodule is possibly malignancy.  We discussed empiric radiation but this would likely cause scarring and worsening lung function.  Even if this were malignancy, the rate of growth is so slow that Beth [name] could possibly survive 2 years without symptoms from this.  She decided against radiation.  As such we will cancel PET scan and delay imaging if she gets symptomatic

## 2022-05-22 NOTE — Patient Instructions (Signed)
X Cancel PET scan   Call as needed

## 2022-05-22 NOTE — Progress Notes (Signed)
Subjective:    Patient ID: Beth [name], female    DOB: 1940-08-10, 82 y.o.   MRN: [id number]  HPI  82 yo former smoker for FU of COPD & chronic respiratory failure  -Enlarging right lower lobe nodule since 2020   PMH :  HTN, organic brain syndrome, stroke, BRCA2 positive, Obesity. 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  Subsequent seizures. Has some word finding issues and cognitive deficits with emotional lability   -BRCA positive after her daughter was diagnosed with breast cancer     She was treated 02/2021 for right lower lobe consolidation. She developed left lower lobe consolidation 05/2021 and received another round of Levaquin , then developed a small effusion. 06/18/21 150 cc of thoracentesis performed , predominantly lymphocytic exudate, cytology negative     10/2021 OV > Bipedal edema - could be r/t cardizem or cor pulmonale due to severe hypoxia.  lasix x 10 ds    ED visit  11/2021 COPD exacerbation with small effusions   OV 11/27/21 Rx for  acute sinusitis and UTIwith empiric Levaquin, and prednisone taper.    58-month follow-up visit. She had labs and a wheelchair accompanied by daughter Toniann Fail. Husband's Alzheimer's is getting worse.  He is still driving.  They are considering moving into an assisted living facility.  She remains on 4 L of oxygen, compliant.  She gets very winded going to the bathroom and back even though she is on oxygen.  Oxygen saturation does not drop and heart rate goes up. She was seen by outpatient palliative care Weight is unchanged. She is able to use a regimen of Advair and Yupelri nebs No cough or wheezing   Significant tests/ events reviewed CT chest without contrast 01/2022 -slight enlargement of right lower lobe nodule 15 x 17 mm, left effusion has resolved with mild scarring     CT chest without con 07/2021 unchanged right lower lobe 1.4 cm  nodule,, small left effusion, left lower lobe nodule not noted, left lower lobe consolidation resolved, evidence of old calcified granulomas   CT chest 05/2021 right lower lobe consolidation overall, right lower lobe nodule unchanged, left lower lobe consolidation/small effusion, left lower lobe nodule not noted   02/2021 PET >> right lower lobe consolidation , right lower lobe nodule not visualized, left lower lobe 1.1 cm nodule hypermetabolic, mediastinal lymph nodes hypermetabolic 02/2021 HRCT was obtained on 02/20/2021 which showed that right lower lobe consolidation had resolved with minimal scarring, no ILD was noted, there was 1 enlarging 1.4 x 1.2 cm nodule.      12/2018 CT chest Scattered pulmonary nodules are seen in the right lower lobe, the largest measuring up to 1.1 cm in diameter.      CT angio chest 07/2015 >> no pulmonary embolism, no emphysema, calcified mediastinal LNs PFTs 09/2015>> severe airway obstruction with FEV1 40%, improves to 47% with albuterol, DLCO decreased to 45%  Review of Systems neg for any significant sore throat, dysphagia, itching, sneezing, nasal congestion or excess/ purulent secretions, fever, chills, sweats, unintended wt loss, pleuritic or exertional cp, hempoptysis, orthopnea pnd or change in chronic leg swelling. Also denies presyncope, palpitations, heartburn, abdominal pain, nausea, vomiting, diarrhea or change in bowel or urinary habits, dysuria,hematuria, rash, arthralgias, visual complaints, headache, numbness weakness or ataxia.     Objective:   Physical Exam  Gen. Pleasant, obese, in no distress, on O2 ENT -  no lesions, no post nasal drip Neck: No JVD, no thyromegaly, no carotid bruits Lungs: no use of accessory muscles, no dullness to percussion, decreased without rales or rhonchi  Cardiovascular: Rhythm regular, heart sounds  normal, no murmurs or gallops, no peripheral edema Musculoskeletal: No deformities, no cyanosis or clubbing , no  tremors        Assessment & Plan:

## 2022-05-22 NOTE — Assessment & Plan Note (Signed)
Continue 4 L oxygen at all times.  Okay to increase to 6 on walking if oxygen saturation drops

## 2022-05-22 NOTE — Assessment & Plan Note (Signed)
We had an extensive end-of-life discussion.  Daughter Beth [name] was present.  Beth [name] would like to know her prognosis and lifespan.  She is considering moving into assisted living since her husband is declining with Alzheimer's and her physical condition is declining.  We discussed that right lower lobe nodule has slowly grown in size.  Lung function is poor and she has required increased oxygen but is stabilized on 4 L.  We discussed that if she gets respiratory infections she will likely end up needing hospitalizations and possibly life support.  She does not want and was not supportive measures but prefer comfortable death.  We discussed signs of decline and indications for hospice care.  She would be agreeable to hospice care if she wants to decline further.

## 2022-05-22 NOTE — Assessment & Plan Note (Signed)
Continue Advair and Yupelri combination. Use albuterol for rescue 

## 2022-05-23 DIAGNOSIS — I10 Essential (primary) hypertension: Secondary | ICD-10-CM | POA: Diagnosis not present

## 2022-05-23 DIAGNOSIS — J9611 Chronic respiratory failure with hypoxia: Secondary | ICD-10-CM | POA: Diagnosis not present

## 2022-05-23 DIAGNOSIS — E7849 Other hyperlipidemia: Secondary | ICD-10-CM | POA: Diagnosis not present

## 2022-05-23 DIAGNOSIS — D508 Other iron deficiency anemias: Secondary | ICD-10-CM | POA: Diagnosis not present

## 2022-05-23 DIAGNOSIS — Z9181 History of falling: Secondary | ICD-10-CM | POA: Diagnosis not present

## 2022-05-23 DIAGNOSIS — J449 Chronic obstructive pulmonary disease, unspecified: Secondary | ICD-10-CM | POA: Diagnosis not present

## 2022-05-27 ENCOUNTER — Other Ambulatory Visit: Payer: Self-pay | Admitting: Adult Health

## 2022-05-27 ENCOUNTER — Other Ambulatory Visit: Payer: Self-pay | Admitting: *Deleted

## 2022-05-27 NOTE — Telephone Encounter (Signed)
Hold until pt seen on 05/28/22 by Dr Vickey Huger

## 2022-05-28 ENCOUNTER — Encounter: Payer: Self-pay | Admitting: Neurology

## 2022-05-28 ENCOUNTER — Ambulatory Visit (INDEPENDENT_AMBULATORY_CARE_PROVIDER_SITE_OTHER): Payer: Medicare Other | Admitting: Neurology

## 2022-05-28 VITALS — BP 136/77 | HR 93 | Ht 64.0 in | Wt 239.0 lb

## 2022-05-28 DIAGNOSIS — J9611 Chronic respiratory failure with hypoxia: Secondary | ICD-10-CM | POA: Diagnosis not present

## 2022-05-28 DIAGNOSIS — F09 Unspecified mental disorder due to known physiological condition: Secondary | ICD-10-CM | POA: Diagnosis not present

## 2022-05-28 DIAGNOSIS — I609 Nontraumatic subarachnoid hemorrhage, unspecified: Secondary | ICD-10-CM

## 2022-05-28 DIAGNOSIS — G40409 Other generalized epilepsy and epileptic syndromes, not intractable, without status epilepticus: Secondary | ICD-10-CM | POA: Diagnosis not present

## 2022-05-28 DIAGNOSIS — F02A4 Dementia in other diseases classified elsewhere, mild, with anxiety: Secondary | ICD-10-CM | POA: Diagnosis not present

## 2022-05-28 DIAGNOSIS — R413 Other amnesia: Secondary | ICD-10-CM

## 2022-05-28 MED ORDER — GABAPENTIN 600 MG PO TABS: ORAL_TABLET | ORAL | 3 refills | Status: AC

## 2022-05-28 MED ORDER — LEVETIRACETAM 750 MG PO TABS: 750.0000 mg | ORAL_TABLET | Freq: Two times a day (BID) | ORAL | 3 refills | Status: AC

## 2022-05-28 MED ORDER — CLONAZEPAM 0.5 MG PO TABS: ORAL_TABLET | ORAL | 0 refills | Status: DC

## 2022-05-28 NOTE — Patient Instructions (Signed)

## 2022-05-28 NOTE — Progress Notes (Signed)
Provider:  Melvyn Novas, MD  Primary Care Physician:  Sigmund Hazel, MD [address] [place] [address]     Referring Provider: Sigmund Hazel, Md [address] [place],  [place] [address]          Chief Complaint according to patient   Patient presents with:     New Patient (Initial Visit)           HISTORY OF PRESENT ILLNESS:  Beth [name] is a 82 y.o. female patient who is here for revisit 05/28/2022 for  seizure and memory disorder. Chief concern according to patient :  I am mostly worried abut my husbands memory loss, declining. He is getting lost, he is often abrupt, not very caring. My daughter looks into assisted living. I am very SOB and need 02 now 24/ 7 . "  Beth [name] assures me that she had no seizure in the interval time since last seen and I will refill meds as needed. I also understand that it is stressful to move to  senior care, for both spouses.  Her memory test today is quoted below.      Beth [name] is now 82 years old and seen in a RV on 09-06-2020. 82 y.o. year old female  has a past medical history of now persistent.and permanent  atrial fibrillation ( Dr Eldridge Dace ) 08-02-2020 [facility]), Arthritis, BRCA2 positive (2017), COPD (chronic obstructive pulmonary disease) (HCC), Diabetes mellitus without complication (HCC), Diverticulosis, Dysrhythmia, Hearing loss, HTN (hypertension), vascular dementia, Pre-diabetes, Seasonal depression (HCC), Seizure disorder, complex partial, 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HCC) (seizure free for one year), Seizures (HCC),, hemorrhagic (HCC) (aneurysm bleed. ), and Vestibulitis of ear. here with:    Vascular dementia, brain organic decline. Had no recent seizures and may tolerate namenda?  Here alone, unaccompanied- she doesn't want her spouse in the room . She feels free when she is alone-  Confessed to a fall at night on the way to the bathroom. Then stated that there were many more falls-  She has had a stroke,  2021: a left carotid stenosis, and atrial fib, d/c 5-10- 2021 to inpatient rehab.  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  She has not had seizures since December 2019 but she was hospitalized with a new diagnosis of stroke and atrial fibrillation.  She underwent a right-sided carotid endarterectomy. Now dementia.  She is oxygen dependent. Her voice is shaky and hoarse.  husband in the waiting room,  She is in a wheelchair because of being short of breath and on a 02 concentrator.  G feels she has more and more difficulties with her memory and tremors.      1-25-2022[name], 82 year-old female with longstanding seizure disorder and dcerebrovasualr disease. Pt alone, rm 10. Presents today for follow up visit. Overall stable. Denies any sz like activity. Pt has concerned r/t her clonazepam. She states her PCP has a clinical pharmacist and they advised her that clonazepam was in 20 mg. (I informed the pt it is not) informed the pt for last couple years she has been getting 2mg  and ordered take 1 at bedtime. (pt then states that she remembers being told she could take 1.5 tab at bedtime if needed. She states that she has not needed an additional 0.5 tablet.  No new seizure activity. Declining cognitive ability, couldn't finish MOCA, did  the mini-mental status exam. She reports difficulties to understand the TV news and speaks with dysarthria/ dysphonia. She is easily SOB.     I have the pleasure of meeting a long-term seizure patient Beth [name]. Quant on 07-11-2019- she is a Caucasian female right-handed and 82 years of age.  She is accompanied today by her husband.   She took her medication in the morning and was trying to take her medication when she noted a flaccid left hand- this was 05-16-2019-and she was found to have a had a stroke, a left carotid stenosis, and atrial fib, d/c 5-10 2021 to inpatient rehab.  She  has chronic organic brain syndrome and in the past had amnestic mild cognitive impairment and some clonic seizures.  She has not had seizures since December 2019 but she was hospitalized with a new diagnosis of stroke and atrial fibrillation.  She underwent a right-sided carotid endarterectomy of which the scar is still visible today, she continues to walk with a walker nonseated is on chronic oxygen supplementation.  At 3 L a minute.   She has a mild tremor in her right hand this is minor and her left hand remained fisted but she is able to extend the fingers and do repeated alternating movements visit.  Her speech is fluent. She does have bilateral severe leg edema.     02-07-2019: Rv for Beth [name], a long-time patient of our [facility] practice. Beth [name]. [name] is a meanwhile 82 year old Caucasian right-handed female patient who is seen here after a 54-month hiatus.  She has felt very isolated during the Covid pandemic, just got her first shot of the vaccine.  Mr. Lawter memory has further declined over the last year and it has led to some sometimes 10 situations.  She has had a stable MoCA test and she is feeling that she is able to take very well care of herself.  She has not had recent seizure activity and not since her last visit with [place].  She just gotten a shipment of generic Keppra since the insurance is not covering the brand name anymore.   She is due to her social isolation somewhat frustrated and she endorsed the geriatric depression scale accordingly at 6 out of 15 points.  This seems to be situational and circumstantial..    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 In 2010 she injured her foot severely during a seizure and many surgeries to the ankle.  She became oxygen dependent in 2018 , has COPD. A cognitive decline has been  evidient in her last 2 visits with [facility] and MMSE testing. She has difficulties to structure her thoughts, is logorrhoeic and yet tangential, but this has improved with oxygen ! She is less fatigued. More alert. She reports today is very good day, and she also reports having very bad ones. Some days she is too tired , too fatigued to move.  Beth [name] had no interval seizure activity, this makes her seizure-free for 12 months.     She continues to take a baby aspirin, albuterol inhaler, atenolol tablets 25 mg her daily dose, captopril 2 times a day, vitamin D,  Lexapro, Allegra and Klonopin 2 mg at bedtime.  She also has Flonase nasal spray, Mucinex as needed for cough, she takes Keppra 750 mg twice a day, she also is on tiotropium bromide, on Maxide  and on ellipta ANORO.   The bedtime medication was needed for nocturnal agitation, and seizure and insomnia. We are discussing her last MRI brain 04-2017, with mild microvascular changes, but a larger area of glioses from [facility].        Review of Systems: Out of a complete 14 system review, the patient complains of only the following symptoms, and all other reviewed systems are negative.:  SOB , 02 dependent.   Social History   Socioeconomic History   Marital status: Married    Spouse name: [name]   Number of children: 2   Years of education: 12   Highest education level: Not on file  Occupational History   Not on file  Tobacco Use   Smoking status: Former    Packs/day: 1.00    Years: 41.00    Additional pack years: 0.00    Total pack years: 41.00    Types: Cigarettes    Quit date: 01/13/2006    Years since quitting: 16.3   Smokeless tobacco: Never  Vaping Use   Vaping Use: Never used  Substance and Sexual Activity   Alcohol use: No    Alcohol/week: 0.0 standard drinks of alcohol   Drug use: No   Sexual activity: Never    Partners: Male    Birth control/protection: Post-menopausal  Other Topics Concern   Not on file  Social History  Narrative   Patient is married ([name]) and lives at home with her husband.Patient is retired.Patient has two children.Patient has a high school education.Patient is right-handed.Patient drinks 1 big mug of coffee daily and sometimes tea in the evenings.   Social Determinants of Health   Financial Resource Strain: Not on file  Food Insecurity: Not on file  Transportation Needs: Not on file  Physical Activity: Not on file  Stress: Not on file  Social Connections: Not on file    Family History  Problem Relation Age of Onset   Dementia Mother    Stroke Mother    Thyroid disease Mother    Osteoporosis Mother    Ovarian cancer Paternal Grandmother 12   Ovarian cancer Paternal Aunt 17   Breast cancer Daughter 56       breast ca x2, bilateral dx. 32, 46--estrogen   Other Daughter        no genetic testing as of 07/2015; has had ovaries removed   Other Daughter        BRCA 2 Pos.   Colon cancer Maternal Aunt        dx. unspecified age   Emphysema Maternal Uncle    High blood pressure Maternal Grandmother    Dementia Maternal Grandfather    Bleeding Disorder Maternal Grandfather    Emphysema Maternal Aunt    Emphysema Maternal Aunt     Past Medical History:  Diagnosis Date   Afib (HCC)    diagnosed 05/17/19   Arthritis    BRCA2 positive 2017   COPD (chronic obstructive pulmonary disease) (HCC)    Diabetes mellitus without complication (HCC)    Diverticulosis    Dysrhythmia    Afib   Hearing loss    HTN (hypertension)    Memory loss    Pre-diabetes    Seasonal depression (HCC)    Seizure disorder, complex partial, 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HCC) seizure free for one year   Seizures [facility])    anniversary seizure - 1 year after serizure   Stroke (HCC)    05/2019   Stroke, hemorrhagic (HCC) aneurysm bleed.  Vestibulitis of ear     Past Surgical History:  Procedure Laterality Date   broken ankle Right    IR THORACENTESIS ASP PLEURAL SPACE W/IMG GUIDE  06/18/2021    TONSILLECTOMY     TRANSCAROTID ARTERY REVASCULARIZATION  Right 06/20/2019   TRANSCAROTID ARTERY REVASCULARIZATION  Right 06/20/2019   Procedure: RIGHT TRANSCAROTID ARTERY REVASCULARIZATION;  Surgeon: Chuck Hint, MD;  Location: [facility];  Service: Vascular;  Laterality: Right;     Current Outpatient Medications on File Prior to Visit  Medication Sig Dispense Refill   acetaminophen (TYLENOL) 325 MG tablet Take 1-2 tablets (325-650 mg total) by mouth every 4 (four) hours as needed for mild pain.     Albuterol Sulfate, sensor, (PROAIR DIGIHALER) 108 (90 Base) MCG/ACT AEPB Inhale 2 puffs into the lungs every 4 (four) hours as needed.     aspirin EC 81 MG tablet Take 81 mg by mouth daily.     atorvastatin (LIPITOR) 20 MG tablet TAKE 1 TABLET DAILY 90 tablet 3   azelastine (ASTELIN) 0.1 % nasal spray Place 1 spray into both nostrils 2 (two) times daily. Use in each nostril as directed 30 mL 3   captopril (CAPOTEN) 50 MG tablet Take 50 mg by mouth 2 (two) times daily.     Cholecalciferol (VITAMIN D3) 2000 units capsule Take 2,000 Units by mouth daily.      diltiazem (CARDIZEM CD) 360 MG 24 hr capsule TAKE 1 CAPSULE DAILY 90 capsule 3   ferrous sulfate 324 MG TBEC Take 324 mg by mouth daily with breakfast.     fluticasone (FLONASE) 50 MCG/ACT nasal spray Place 1 spray into the nose daily as needed for allergies. Spray twice daily as needed     fluticasone-salmeterol (ADVAIR HFA) 230-21 MCG/ACT inhaler USE 2 INHALATIONS TWICE A DAY 36 g 3   furosemide (LASIX) 40 MG tablet Take 1 tablet (40 mg total) by mouth daily for 5 days. 5 tablet 0   gabapentin (NEURONTIN) 600 MG tablet TAKE ONE-HALF (1/2) TABLET AT BEDTIME 45 tablet 3   guaiFENesin (MUCINEX) 600 MG 12 hr tablet Take 1,200 mg by mouth 2 (two) times daily.     levETIRAcetam (KEPPRA) 750 MG tablet TAKE 1 TABLET TWICE A DAY 180 tablet 3   LEXAPRO 20 MG tablet Take 1 tablet (20 mg total) by mouth daily. Morning dosage 90 tablet 3   metoprolol  tartrate (LOPRESSOR) 25 MG tablet Take 25 mg by mouth 2 (two) times daily as needed.     MICONAZOLE NITRATE EX Apply topically.     nystatin cream (MYCOSTATIN) Apply 1 Application topically 2 (two) times daily.     pantoprazole (PROTONIX) 40 MG tablet Take 40 mg by mouth 2 (two) times daily.     potassium chloride (KLOR-CON M) 10 MEQ tablet Take 1 tablet (10 mEq total) by mouth daily for 5 days. 5 tablet 0   revefenacin (YUPELRI) 175 MCG/3ML nebulizer solution Take 3 mLs (175 mcg total) by nebulization daily. 21 mL 0   sodium chloride HYPERTONIC 3 % nebulizer solution Take by nebulization in the morning and at bedtime. 75 mL 11   Spacer/Aero-Holding Chambers (AEROCHAMBER PLUS WITH MASK) inhaler As needed with inhaler 1 each 0   vitamin B-12 (CYANOCOBALAMIN) 500 MCG tablet Take 1 tablet (500 mcg total) by mouth daily.     No current facility-administered medications on file prior to visit.    Allergies  Allergen Reactions   Prozac [Fluoxetine Hcl] Other (See Comments)  seizures   Sulfa Antibiotics Nausea And Vomiting and Other (See Comments)    TINGLING IN LEGS ALSO   Micardis [Telmisartan] Swelling and Other (See Comments)    Swelling, bruising Swelling, bruising   Prednisone Other (See Comments)    mood changes Mood changes   Allegra [Fexofenadine]     Makes her sleep 24 hours    Prozac [Fluoxetine]     seizure   Augmentin [Amoxicillin-Pot Clavulanate] Rash   Avelox [Moxifloxacin Hcl In Nacl] Rash    LEVAQUIN IS OK-SEE NOTE   Lisinopril-Hydrochlorothiazide Other (See Comments)    unknown   Norvasc [Amlodipine Besylate] Other (See Comments)    fatigue     DIAGNOSTIC DATA (LABS, IMAGING, TESTING) - I reviewed patient records, labs, notes, testing and imaging myself where available.  Lab Results  Component Value Date   WBC 13.0 (H) 11/25/2021   HGB 9.7 (L) 11/25/2021   HCT 31.6 (L) 11/25/2021   MCV 94.0 11/25/2021   PLT 339 11/25/2021      Component Value Date/Time    NA 140 11/25/2021 1606   NA 146 (H) 08/02/2020 1221   K 4.4 11/25/2021 1606   CL 99 11/25/2021 1606   CO2 34 (H) 11/25/2021 1606   GLUCOSE 113 (H) 11/25/2021 1606   BUN 11 11/25/2021 1606   BUN 13 08/02/2020 1221   CREATININE 0.61 11/25/2021 1606   CREATININE 0.85 02/22/2018 1051   CALCIUM 8.7 (L) 11/25/2021 1606   PROT 7.4 11/25/2021 1606   ALBUMIN 3.1 (L) 11/25/2021 1606   AST 16 11/25/2021 1606   AST 18 02/22/2018 1051   ALT 15 11/25/2021 1606   ALT 16 02/22/2018 1051   ALKPHOS 59 11/25/2021 1606   BILITOT 0.5 11/25/2021 1606   BILITOT 0.6 02/22/2018 1051   GFRNONAA >60 11/25/2021 1606   GFRNONAA >60 02/22/2018 1051   GFRAA >60 06/20/2019 0657   GFRAA >60 02/22/2018 1051   Lab Results  Component Value Date   CHOL 151 05/17/2019   HDL 39 (L) 05/17/2019   LDLCALC 78 05/17/2019   TRIG 172 (H) 05/17/2019   CHOLHDL 3.9 05/17/2019   Lab Results  Component Value Date   HGBA1C 6.0 (H) 05/17/2019   Lab Results  Component Value Date   VITAMINB12 280 05/16/2019   Lab Results  Component Value Date   TSH 1.737 05/16/2019    PHYSICAL EXAM:  Today's Vitals   05/28/22 1125  BP: 136/77  Pulse: 93  Weight: 239 lb (108.4 kg)  Height: 5\' 4"  (1.626 m)   Body mass index is 41.02 kg/m.   Wt Readings from Last 3 Encounters:  05/28/22 239 lb (108.4 kg)  05/22/22 239 lb 3.2 oz (108.5 kg)  03/04/22 239 lb (108.4 kg)     Ht Readings from Last 3 Encounters:  05/28/22 5\' 4"  (1.626 m)  05/22/22 5\' 4"  (1.626 m)  03/04/22 5\' 4"  (1.626 m)      General: The patient is awake, alert and appears  SOB . The patient is well groomed. Head: Normocephalic, atraumatic. Neck is supple. neck circumference:17.25 inches . Nasal airflow is patent.   Cardiovascular:  Regular rate and cardiac rhythm by pulse,  without distended neck veins. Respiratory: wheezing.  Skin:  Without evidence of ankle edema, or rash. Trunk: The patient's posture is erect.   NEUROLOGIC EXAM: The patient is  awake and alert, oriented to place and time.   Memory subjective described as intact.  Attention span & concentration ability appears normal.  Speech  is horse , and not- fluent,  she has to gasp for air.  Mood and affect are anxious.    CMentation: today unable to state  time, place, history . She is a  anxious. She fears her husband develops dementia. She is not coherent , very tangential, and has been all over the place with her interval report, non-fluent speech, dysphonic. MOCA. Had to be aborted and we did MMSE.   mini-mental status exam    05/28/2022   11:29 AM 05/14/2021    1:42 PM 09/06/2020    3:31 PM 02/07/2020    3:21 PM  MMSE - Mini Mental State Exam  Orientation to time 5 5 5 3   Orientation to Place 5 5 5 5   Registration 3 3 3 3   Attention/ Calculation 1 3 1 3   Recall 3 3 1  0  Language- name 2 objects 2 2 2 2   Language- repeat 1 1 1 1   Language- follow 3 step command 3 3 3 3   Language- read & follow direction 1 1 1 1   Write a sentence 1 1 1 1   Copy design 1 1 1  0  Total score 26 28 24 22       Pupils are  round- and remained reactive to light.  Extraocular movements were full, visual field were full on confrontational test.  Facial sensation and strength were normal.  Uvula and tongue move in midline, no swelling, no tremor.  Neck stiffness- restricted ROM-  Head turning and shoulder shrug were impaired.     SENSORY loss of Vibration and soft touchin both lower extremities. Even at the patella.    MOTOR - elevated tone, left sided spasticity and right sided cogwheeling, but good grip strength.  Coordination: delayed in finger to nose and rapid alternating movements. There is a mild action tremor.    Gait and station: Gait -She was seated today in a wheelchair, it was difficult to get her up and standing, she is unsteady, she feels weak.  We transferred together to a chair. She was able to stand unassisted for 20 seconds.    Reflexes: Deep tendon reflexes are  asymmetric - left spasticity, right is less brisk .    ASSESSMENT AND PLAN 82 y.o.  female patient with a history of brain bleed, seizures, vascular dementia and severe pulmonary disease( COPD ) and atrial fib -  here with:    1) stabilized memory , neurocognitive impairment /encephalopathy after stroke/ bleed- left side body hemiparesis. Marland Kitchenmemory impairment  has been present for 5 years or longer.  26/ 30 MMSE is a good result.   2) seizures are controlled. Keppra refilled.   3) Insomnia due to anxiety, stress of / fear of moving to assisted living, for her husband to memory care. Her daughter has a seizure disorder and she is worried about her. Gabapentin refilled.  She has slept better with Klonopin , 0.5 mg but she is asked to use it sparingly- I offered 0.5 mg, no  refills, 30 tabs.    I plan to follow up either personally or through our NP within 12 months, repeat MMSE , refill seizure meds.   I would like to thank Sigmund Hazel, MD ,  Dr Alroy Bailiff, Dr Vassie Loll, for allowing me to meet with and to take care of this pleasant patient.    After spending a total time of  34  minutes face to face and additional time for physical and neurologic examination, review of laboratory studies,  personal  review of imaging studies, reports and results of other testing and review of referral information / records as far as provided in visit,   Electronically signed by: Melvyn Novas, MD 05/28/2022 11:39 AM  [facility] Board certified by The [employer] of Sleep Medicine and Diplomate of the [employer] of Sleep Medicine. Board certified In Neurology through the ABPN, Fellow of the [employer] of Neu[employer].

## 2022-05-29 ENCOUNTER — Other Ambulatory Visit: Payer: Self-pay | Admitting: Neurology

## 2022-05-29 ENCOUNTER — Telehealth: Payer: Self-pay | Admitting: Neurology

## 2022-05-29 DIAGNOSIS — Z9181 History of falling: Secondary | ICD-10-CM | POA: Diagnosis not present

## 2022-05-29 DIAGNOSIS — J9611 Chronic respiratory failure with hypoxia: Secondary | ICD-10-CM | POA: Diagnosis not present

## 2022-05-29 DIAGNOSIS — D508 Other iron deficiency anemias: Secondary | ICD-10-CM | POA: Diagnosis not present

## 2022-05-29 DIAGNOSIS — E7849 Other hyperlipidemia: Secondary | ICD-10-CM | POA: Diagnosis not present

## 2022-05-29 DIAGNOSIS — J449 Chronic obstructive pulmonary disease, unspecified: Secondary | ICD-10-CM | POA: Diagnosis not present

## 2022-05-29 DIAGNOSIS — I10 Essential (primary) hypertension: Secondary | ICD-10-CM | POA: Diagnosis not present

## 2022-05-29 MED ORDER — CLONAZEPAM 0.5 MG PO TABS: 0.5000 mg | ORAL_TABLET | Freq: Every day | ORAL | 0 refills | Status: DC | PRN

## 2022-05-29 NOTE — Telephone Encounter (Signed)
FYI-Pt called stating that she does not remember informing the provider yesterday at her appt about the shakes/tremors she has been experiencing for about the last 6 weeks. Pt states that it comes and goes and it is always on her R side. It sometimes is her whole R side or it could just be her arm at times or her hand.

## 2022-05-29 NOTE — Telephone Encounter (Signed)
I have sent this per [facility] message    Dear Mrs Inscoe,   I do not have any additional openings to address a tremor in a visit before your next scheduled appointment.  As an oxygen dependent  patient , tremors can be related to COPD inhalers, to  previous brain strokes and to anxiety/ stress.   We will meet next time and suggest you bring a list of questions to the appointment.   Melvyn Novas, MD

## 2022-05-29 NOTE — Telephone Encounter (Signed)
Beth [name] called from [place]. Stated he needs to clarify direction on clonazePAM (KLONOPIN) 0.5 MG tablet.

## 2022-05-29 NOTE — Telephone Encounter (Signed)
Called [facility] back. Advised the pharmacist that patient can take 1 tablet daily as needed at bedtime. He has updated the script and will get it filled for the patient.

## 2022-05-29 NOTE — Telephone Encounter (Signed)
Addressed , was renewed

## 2022-06-02 DIAGNOSIS — E1169 Type 2 diabetes mellitus with other specified complication: Secondary | ICD-10-CM | POA: Diagnosis not present

## 2022-06-02 DIAGNOSIS — F3341 Major depressive disorder, recurrent, in partial remission: Secondary | ICD-10-CM | POA: Diagnosis not present

## 2022-06-02 DIAGNOSIS — J449 Chronic obstructive pulmonary disease, unspecified: Secondary | ICD-10-CM | POA: Diagnosis not present

## 2022-06-02 DIAGNOSIS — Z111 Encounter for screening for respiratory tuberculosis: Secondary | ICD-10-CM | POA: Diagnosis not present

## 2022-06-02 DIAGNOSIS — J9611 Chronic respiratory failure with hypoxia: Secondary | ICD-10-CM | POA: Diagnosis not present

## 2022-06-02 DIAGNOSIS — D6489 Other specified anemias: Secondary | ICD-10-CM | POA: Diagnosis not present

## 2022-06-04 ENCOUNTER — Ambulatory Visit: Payer: Medicare Other | Admitting: Adult Health

## 2022-06-23 DIAGNOSIS — Z6841 Body Mass Index (BMI) 40.0 and over, adult: Secondary | ICD-10-CM | POA: Diagnosis not present

## 2022-06-23 DIAGNOSIS — Z9181 History of falling: Secondary | ICD-10-CM | POA: Diagnosis not present

## 2022-06-23 DIAGNOSIS — T07XXXA Unspecified multiple injuries, initial encounter: Secondary | ICD-10-CM | POA: Diagnosis not present

## 2022-06-23 DIAGNOSIS — T148XXA Other injury of unspecified body region, initial encounter: Secondary | ICD-10-CM | POA: Diagnosis not present

## 2022-07-10 DIAGNOSIS — M79605 Pain in left leg: Secondary | ICD-10-CM | POA: Diagnosis not present

## 2022-07-11 ENCOUNTER — Ambulatory Visit (HOSPITAL_COMMUNITY)
Admission: RE | Admit: 2022-07-11 | Discharge: 2022-07-11 | Disposition: A | Discharge status: Home / Self Care | Payer: Medicare Other | Source: Ambulatory Visit | Attending: Vascular Surgery | Admitting: Vascular Surgery

## 2022-07-11 ENCOUNTER — Other Ambulatory Visit (HOSPITAL_COMMUNITY): Payer: Self-pay | Admitting: Family Medicine

## 2022-07-11 DIAGNOSIS — M79605 Pain in left leg: Secondary | ICD-10-CM

## 2022-07-21 ENCOUNTER — Other Ambulatory Visit: Payer: Self-pay | Admitting: *Deleted

## 2022-07-21 DIAGNOSIS — I6521 Occlusion and stenosis of right carotid artery: Secondary | ICD-10-CM

## 2022-07-28 ENCOUNTER — Other Ambulatory Visit: Payer: Self-pay | Admitting: Neurology

## 2022-07-29 ENCOUNTER — Encounter (HOSPITAL_BASED_OUTPATIENT_CLINIC_OR_DEPARTMENT_OTHER): Payer: Self-pay | Admitting: Pulmonary Disease

## 2022-07-31 ENCOUNTER — Encounter: Payer: Self-pay | Admitting: Vascular Surgery

## 2022-07-31 ENCOUNTER — Ambulatory Visit: Payer: Medicare Other | Admitting: Vascular Surgery

## 2022-07-31 ENCOUNTER — Ambulatory Visit (HOSPITAL_COMMUNITY)
Admission: RE | Admit: 2022-07-31 | Discharge: 2022-07-31 | Disposition: A | Discharge status: Home / Self Care | Payer: Medicare Other | Source: Ambulatory Visit | Attending: Vascular Surgery | Admitting: Vascular Surgery

## 2022-07-31 VITALS — BP 143/70 | HR 82 | Temp 97.7°F | Resp 16 | Ht 64.0 in | Wt 240.0 lb

## 2022-07-31 DIAGNOSIS — I6521 Occlusion and stenosis of right carotid artery: Secondary | ICD-10-CM | POA: Insufficient documentation

## 2022-07-31 NOTE — Progress Notes (Signed)
REASON FOR VISIT:   Follow-up after TCAR  MEDICAL ISSUES:   S/P TCAR: This patient underwent a right TCAR in June 2021.  She comes in for yearly follow-up visit.  Her carotid stent is widely patent.  She has no significant disease on the left.  She is on aspirin and a statin.  I explained that I will be retiring so she will be seen on the PA schedule in 1 year with a follow-up duplex scan.  I have ordered a follow-up carotid duplex scan in 1 year.  She knows to continue her aspirin and statin.  She did have a small hematoma on her left leg where she bumped her leg but has biphasic signals in both feet and I think this will heal without any problem.  Currently there is no broken skin.   HPI:   Beth [name] is a pleasant 82 y.o. female who had a high-grade asymptomatic right carotid stenosis.  She had a high bifurcation.  On 06/20/2019 she underwent a TCAR on the right.  When I saw her last on 07/04/2021 the right carotid stent was widely patent.  She had no significant stenosis on the left.  Both vertebral arteries were patent with antegrade flow.  She comes in for a 1 year follow-up visit.  Since I saw her last, she denies any history of stroke, TIAs, expressive or receptive aphasia, or amaurosis fugax.  She did develop a small hematoma on her left leg after she was getting a shower.  There is an underlying reticular vein here and I suspect she had a small bleed related to this vein.  Past Medical History:  Diagnosis Date   Afib (HCC)    diagnosed 05/17/19   Arthritis    BRCA2 positive 2017   COPD (chronic obstructive pulmonary disease) (HCC)    Diabetes mellitus without complication (HCC)    Diverticulosis    Dysrhythmia    Afib   Hearing loss    HTN (hypertension)    Memory loss    Pre-diabetes    Seasonal depression (HCC)    Seizure disorder, complex partial, 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HCC) seizure free for one year   Seizures [facility])    anniversary seizure - 1 year after  serizure   Stroke (HCC)    05/2019   Stroke, hemorrhagic (HCC) aneurysm bleed.    Vestibulitis of ear     Family History  Problem Relation Age of Onset   Dementia Mother    Stroke Mother    Thyroid disease Mother    Osteoporosis Mother    Ovarian cancer Paternal Grandmother 41   Ovarian cancer Paternal Aunt 60   Breast cancer Daughter 51       breast ca x2, bilateral dx. 7, 46--estrogen   Other Daughter        no genetic testing as of 07/2015; has had ovaries removed   Other Daughter        BRCA 2 Pos.   Colon cancer Maternal Aunt        dx. unspecified age   Emphysema Maternal Uncle    High blood pressure Maternal Grandmother    Dementia Maternal Grandfather    Bleeding Disorder Maternal Grandfather    Emphysema Maternal Aunt    Emphysema Maternal Aunt     SOCIAL HISTORY: Social History   Tobacco Use   Smoking status: Former    Current packs/day: 0.00    Average packs/day: 1 pack/day for 41.0 years (41.0 ttl  pk-yrs)    Types: Cigarettes    Start date: 01/13/1965    Quit date: 01/13/2006    Years since quitting: 16.5   Smokeless tobacco: Never  Substance Use Topics   Alcohol use: No    Alcohol/week: 0.0 standard drinks of alcohol    Allergies  Allergen Reactions   Prozac [Fluoxetine Hcl] Other (See Comments)    seizures   Sulfa Antibiotics Nausea And Vomiting and Other (See Comments)    TINGLING IN LEGS ALSO   Micardis [Telmisartan] Swelling and Other (See Comments)    Swelling, bruising Swelling, bruising   Prednisone Other (See Comments)    mood changes Mood changes   Allegra [Fexofenadine]     Makes her sleep 24 hours    Prozac [Fluoxetine]     seizure   Augmentin [Amoxicillin-Pot Clavulanate] Rash   Avelox [Moxifloxacin Hcl In Nacl] Rash    LEVAQUIN IS OK-SEE NOTE   Lisinopril-Hydrochlorothiazide Other (See Comments)    unknown   Norvasc [Amlodipine Besylate] Other (See Comments)    fatigue    Current Outpatient Medications  Medication Sig  Dispense Refill   acetaminophen (TYLENOL) 325 MG tablet Take 1-2 tablets (325-650 mg total) by mouth every 4 (four) hours as needed for mild pain.     Albuterol Sulfate, sensor, (PROAIR DIGIHALER) 108 (90 Base) MCG/ACT AEPB Inhale 2 puffs into the lungs every 4 (four) hours as needed.     aspirin EC 81 MG tablet Take 81 mg by mouth daily.     atorvastatin (LIPITOR) 20 MG tablet TAKE 1 TABLET DAILY 90 tablet 3   azelastine (ASTELIN) 0.1 % nasal spray Place 1 spray into both nostrils 2 (two) times daily. Use in each nostril as directed 30 mL 3   captopril (CAPOTEN) 50 MG tablet Take 50 mg by mouth 2 (two) times daily.     Cholecalciferol (VITAMIN D3) 2000 units capsule Take 2,000 Units by mouth daily.      clonazePAM (KLONOPIN) 0.5 MG tablet TAKE 1 TABLET BY MOUTH EVERY NIGHT AT BEDTIME AS NEEDED FOR ANXIETY AND INSOMNIA 30 tablet 1   diltiazem (CARDIZEM CD) 360 MG 24 hr capsule TAKE 1 CAPSULE DAILY 90 capsule 3   ferrous sulfate 324 MG TBEC Take 324 mg by mouth daily with breakfast.     fluticasone (FLONASE) 50 MCG/ACT nasal spray Place 1 spray into the nose daily as needed for allergies. Spray twice daily as needed     fluticasone-salmeterol (ADVAIR HFA) 230-21 MCG/ACT inhaler USE 2 INHALATIONS TWICE A DAY 36 g 3   gabapentin (NEURONTIN) 600 MG tablet TAKE ONE-HALF (1/2) TABLET AT BEDTIME 45 tablet 3   guaiFENesin (MUCINEX) 600 MG 12 hr tablet Take 1,200 mg by mouth 2 (two) times daily.     levETIRAcetam (KEPPRA) 750 MG tablet Take 1 tablet (750 mg total) by mouth 2 (two) times daily. 180 tablet 3   LEXAPRO 20 MG tablet Take 1 tablet (20 mg total) by mouth daily. Morning dosage 90 tablet 3   metoprolol tartrate (LOPRESSOR) 25 MG tablet Take 25 mg by mouth 2 (two) times daily as needed.     MICONAZOLE NITRATE EX Apply topically.     nystatin cream (MYCOSTATIN) Apply 1 Application topically 2 (two) times daily.     pantoprazole (PROTONIX) 40 MG tablet Take 40 mg by mouth 2 (two) times daily.      revefenacin (YUPELRI) 175 MCG/3ML nebulizer solution Take 3 mLs (175 mcg total) by nebulization daily. 21 mL 0  sodium chloride HYPERTONIC 3 % nebulizer solution Take by nebulization in the morning and at bedtime. 75 mL 11   Spacer/Aero-Holding Chambers (AEROCHAMBER PLUS WITH MASK) inhaler As needed with inhaler 1 each 0   vitamin B-12 (CYANOCOBALAMIN) 500 MCG tablet Take 1 tablet (500 mcg total) by mouth daily.     potassium chloride (KLOR-CON M) 10 MEQ tablet Take 1 tablet (10 mEq total) by mouth daily for 5 days. 5 tablet 0   No current facility-administered medications for this visit.    REVIEW OF SYSTEMS:  [X]  denotes positive finding, [ ]  denotes negative finding Cardiac  Comments:  Chest pain or chest pressure:    Shortness of breath upon exertion:    Short of breath when lying flat:    Irregular heart rhythm:        Vascular    Pain in calf, thigh, or hip brought on by ambulation:    Pain in feet at night that wakes you up from your sleep:     Blood clot in your veins:    Leg swelling:         Pulmonary    Oxygen at home:    Productive cough:     Wheezing:         Neurologic    Sudden weakness in arms or legs:     Sudden numbness in arms or legs:     Sudden onset of difficulty speaking or slurred speech:    Temporary loss of vision in one eye:     Problems with dizziness:         Gastrointestinal    Blood in stool:     Vomited blood:         Genitourinary    Burning when urinating:     Blood in urine:        Psychiatric    Major depression:         Hematologic    Bleeding problems:    Problems with blood clotting too easily:        Skin    Rashes or ulcers:        Constitutional    Fever or chills:     PHYSICAL EXAM:   Vitals:   07/31/22 1508 07/31/22 1513  BP: 119/65 (!) 143/70  Pulse: 88 82  Resp: 16   Temp: 97.7 F (36.5 C)   TempSrc: Temporal   SpO2: 96%   Weight: 240 lb (108.9 kg)   Height: 5\' 4"  (1.626 m)    GENERAL: The patient is  a well-nourished female, in no acute distress. The vital signs are documented above. CARDIAC: There is a regular rate and rhythm.  VASCULAR: I do not detect carotid bruits. I cannot palpate pedal pulses, however she has a biphasic posterior tibial and dorsalis pedis signal bilaterally. PULMONARY: There is good air exchange bilaterally without wheezing or rales. ABDOMEN: Soft and non-tender with normal pitched bowel sounds.  MUSCULOSKELETAL: There are no major deformities or cyanosis. NEUROLOGIC: No focal weakness or paresthesias are detected. SKIN: She had a small hematoma on her left anterior shin where she bumped her leg but there is no broken skin.  There is an underlying reticular vein. PSYCHIATRIC: The patient has a normal affect.  DATA:    CAROTID DUPLEX: I have independently interpreted her carotid duplex scan today.  On the right side the right carotid stent is widely patent.  There is no evidence of stenosis.  The right vertebral artery is patent with  antegrade flow.  There is no significant left carotid stenosis.  The left vertebral artery is patent with antegrade flow.  There was no evidence of significant subclavian artery disease bilaterally.  Waverly Ferrari [facility] [phone]

## 2022-08-06 ENCOUNTER — Other Ambulatory Visit: Payer: Self-pay

## 2022-08-06 DIAGNOSIS — I6521 Occlusion and stenosis of right carotid artery: Secondary | ICD-10-CM

## 2022-08-14 ENCOUNTER — Ambulatory Visit (HOSPITAL_BASED_OUTPATIENT_CLINIC_OR_DEPARTMENT_OTHER): Payer: Medicare Other | Admitting: Pulmonary Disease

## 2022-08-19 ENCOUNTER — Encounter (HOSPITAL_BASED_OUTPATIENT_CLINIC_OR_DEPARTMENT_OTHER): Payer: Self-pay | Admitting: Pulmonary Disease

## 2022-08-20 MED ORDER — AZELASTINE HCL 0.1 % NA SOLN: 1.0000 | Freq: Two times a day (BID) | NASAL | 3 refills | Status: AC

## 2022-09-02 ENCOUNTER — Ambulatory Visit (HOSPITAL_COMMUNITY): Payer: Medicare Other

## 2022-09-05 DIAGNOSIS — J9611 Chronic respiratory failure with hypoxia: Secondary | ICD-10-CM | POA: Diagnosis not present

## 2022-09-05 DIAGNOSIS — D6489 Other specified anemias: Secondary | ICD-10-CM | POA: Diagnosis not present

## 2022-09-05 DIAGNOSIS — E119 Type 2 diabetes mellitus without complications: Secondary | ICD-10-CM | POA: Diagnosis not present

## 2022-09-05 DIAGNOSIS — E1159 Type 2 diabetes mellitus with other circulatory complications: Secondary | ICD-10-CM | POA: Diagnosis not present

## 2022-09-05 DIAGNOSIS — Z1389 Encounter for screening for other disorder: Secondary | ICD-10-CM | POA: Diagnosis not present

## 2022-09-05 DIAGNOSIS — F3341 Major depressive disorder, recurrent, in partial remission: Secondary | ICD-10-CM | POA: Diagnosis not present

## 2022-09-05 DIAGNOSIS — Z6841 Body Mass Index (BMI) 40.0 and over, adult: Secondary | ICD-10-CM | POA: Diagnosis not present

## 2022-09-05 DIAGNOSIS — I10 Essential (primary) hypertension: Secondary | ICD-10-CM | POA: Diagnosis not present

## 2022-09-05 DIAGNOSIS — J449 Chronic obstructive pulmonary disease, unspecified: Secondary | ICD-10-CM | POA: Diagnosis not present

## 2022-09-05 DIAGNOSIS — G629 Polyneuropathy, unspecified: Secondary | ICD-10-CM | POA: Diagnosis not present

## 2022-09-05 DIAGNOSIS — Z23 Encounter for immunization: Secondary | ICD-10-CM | POA: Diagnosis not present

## 2022-09-05 DIAGNOSIS — Z0000 Encounter for general adult medical examination without abnormal findings: Secondary | ICD-10-CM | POA: Diagnosis not present

## 2022-09-19 DIAGNOSIS — F411 Generalized anxiety disorder: Secondary | ICD-10-CM | POA: Diagnosis not present

## 2022-09-19 DIAGNOSIS — F331 Major depressive disorder, recurrent, moderate: Secondary | ICD-10-CM | POA: Diagnosis not present

## 2022-09-23 ENCOUNTER — Ambulatory Visit (HOSPITAL_BASED_OUTPATIENT_CLINIC_OR_DEPARTMENT_OTHER): Payer: Medicare Other | Admitting: Pulmonary Disease

## 2022-09-23 ENCOUNTER — Encounter (HOSPITAL_BASED_OUTPATIENT_CLINIC_OR_DEPARTMENT_OTHER): Payer: Self-pay | Admitting: Pulmonary Disease

## 2022-09-23 DIAGNOSIS — E559 Vitamin D deficiency, unspecified: Secondary | ICD-10-CM | POA: Diagnosis not present

## 2022-09-23 DIAGNOSIS — J441 Chronic obstructive pulmonary disease with (acute) exacerbation: Secondary | ICD-10-CM | POA: Diagnosis not present

## 2022-09-23 DIAGNOSIS — F411 Generalized anxiety disorder: Secondary | ICD-10-CM | POA: Diagnosis not present

## 2022-09-23 DIAGNOSIS — F445 Conversion disorder with seizures or convulsions: Secondary | ICD-10-CM | POA: Diagnosis not present

## 2022-09-23 DIAGNOSIS — J9611 Chronic respiratory failure with hypoxia: Secondary | ICD-10-CM | POA: Diagnosis not present

## 2022-09-23 DIAGNOSIS — I6930 Unspecified sequelae of cerebral infarction: Secondary | ICD-10-CM | POA: Diagnosis not present

## 2022-09-23 DIAGNOSIS — D509 Iron deficiency anemia, unspecified: Secondary | ICD-10-CM | POA: Diagnosis not present

## 2022-09-23 DIAGNOSIS — E7849 Other hyperlipidemia: Secondary | ICD-10-CM | POA: Diagnosis not present

## 2022-09-23 DIAGNOSIS — G629 Polyneuropathy, unspecified: Secondary | ICD-10-CM | POA: Diagnosis not present

## 2022-09-23 DIAGNOSIS — E1143 Type 2 diabetes mellitus with diabetic autonomic (poly)neuropathy: Secondary | ICD-10-CM | POA: Diagnosis not present

## 2022-09-25 DIAGNOSIS — E785 Hyperlipidemia, unspecified: Secondary | ICD-10-CM | POA: Diagnosis not present

## 2022-09-25 DIAGNOSIS — E119 Type 2 diabetes mellitus without complications: Secondary | ICD-10-CM | POA: Diagnosis not present

## 2022-09-25 DIAGNOSIS — E039 Hypothyroidism, unspecified: Secondary | ICD-10-CM | POA: Diagnosis not present

## 2022-09-25 DIAGNOSIS — I10 Essential (primary) hypertension: Secondary | ICD-10-CM | POA: Diagnosis not present

## 2022-09-25 DIAGNOSIS — E559 Vitamin D deficiency, unspecified: Secondary | ICD-10-CM | POA: Diagnosis not present

## 2022-09-26 DIAGNOSIS — I10 Essential (primary) hypertension: Secondary | ICD-10-CM | POA: Diagnosis not present

## 2022-09-26 DIAGNOSIS — E559 Vitamin D deficiency, unspecified: Secondary | ICD-10-CM | POA: Diagnosis not present

## 2022-09-26 DIAGNOSIS — E119 Type 2 diabetes mellitus without complications: Secondary | ICD-10-CM | POA: Diagnosis not present

## 2022-09-26 DIAGNOSIS — E785 Hyperlipidemia, unspecified: Secondary | ICD-10-CM | POA: Diagnosis not present

## 2022-09-26 DIAGNOSIS — R2681 Unsteadiness on feet: Secondary | ICD-10-CM | POA: Diagnosis not present

## 2022-09-26 DIAGNOSIS — J449 Chronic obstructive pulmonary disease, unspecified: Secondary | ICD-10-CM | POA: Diagnosis not present

## 2022-09-26 DIAGNOSIS — E039 Hypothyroidism, unspecified: Secondary | ICD-10-CM | POA: Diagnosis not present

## 2022-09-26 DIAGNOSIS — M6281 Muscle weakness (generalized): Secondary | ICD-10-CM | POA: Diagnosis not present

## 2022-09-29 DIAGNOSIS — R2681 Unsteadiness on feet: Secondary | ICD-10-CM | POA: Diagnosis not present

## 2022-09-29 DIAGNOSIS — M6281 Muscle weakness (generalized): Secondary | ICD-10-CM | POA: Diagnosis not present

## 2022-10-01 DIAGNOSIS — M6281 Muscle weakness (generalized): Secondary | ICD-10-CM | POA: Diagnosis not present

## 2022-10-01 DIAGNOSIS — R2681 Unsteadiness on feet: Secondary | ICD-10-CM | POA: Diagnosis not present

## 2022-10-02 ENCOUNTER — Encounter: Payer: Self-pay | Admitting: Neurology

## 2022-10-07 DIAGNOSIS — R2681 Unsteadiness on feet: Secondary | ICD-10-CM | POA: Diagnosis not present

## 2022-10-07 DIAGNOSIS — M6281 Muscle weakness (generalized): Secondary | ICD-10-CM | POA: Diagnosis not present

## 2022-10-07 DIAGNOSIS — J449 Chronic obstructive pulmonary disease, unspecified: Secondary | ICD-10-CM | POA: Diagnosis not present

## 2022-10-09 ENCOUNTER — Other Ambulatory Visit: Payer: Self-pay | Admitting: Nurse Practitioner

## 2022-10-09 DIAGNOSIS — M6281 Muscle weakness (generalized): Secondary | ICD-10-CM | POA: Diagnosis not present

## 2022-10-09 DIAGNOSIS — J449 Chronic obstructive pulmonary disease, unspecified: Secondary | ICD-10-CM | POA: Diagnosis not present

## 2022-10-09 DIAGNOSIS — R600 Localized edema: Secondary | ICD-10-CM | POA: Diagnosis not present

## 2022-10-09 DIAGNOSIS — R2681 Unsteadiness on feet: Secondary | ICD-10-CM | POA: Diagnosis not present

## 2022-10-10 ENCOUNTER — Telehealth (HOSPITAL_BASED_OUTPATIENT_CLINIC_OR_DEPARTMENT_OTHER): Payer: Self-pay | Admitting: Pulmonary Disease

## 2022-10-10 ENCOUNTER — Other Ambulatory Visit (HOSPITAL_BASED_OUTPATIENT_CLINIC_OR_DEPARTMENT_OTHER): Payer: Self-pay

## 2022-10-10 MED ORDER — FLUTICASONE-SALMETEROL 230-21 MCG/ACT IN AERO: 2.0000 | INHALATION_SPRAY | Freq: Two times a day (BID) | RESPIRATORY_TRACT | 3 refills | Status: AC

## 2022-10-10 MED ORDER — PROAIR DIGIHALER 108 (90 BASE) MCG/ACT IN AEPB: 2.0000 | INHALATION_SPRAY | RESPIRATORY_TRACT | 3 refills | Status: DC | PRN

## 2022-10-10 NOTE — Telephone Encounter (Signed)
Patient has been placed into an assited living home, and her daughter is no longer taking responsibility for her care. Patient states she is out of a medication, but was unable to recall which one. According to her meds, all the meds RA prescribes have refills available but patient was unable to understand this. Her pharmacy is [facility]. Please review chart to see if there is anything we need to refill and advise patient of this as well.

## 2022-10-10 NOTE — Telephone Encounter (Signed)
No number for patient. The number listed is for her daughter. She states that she needs advair and albuterol refills. Refills sent.

## 2022-10-10 NOTE — Telephone Encounter (Signed)
Patient has been moved to assited living, and her daughter is no longer taking part in her care. She was supposed to have a PET on 8/20, but this was cancelled by her daughter. Patient is wanting to reschedule this. Her f/u with RA is set for 12/1. Please advise and call patient to reschedule.

## 2022-10-13 DIAGNOSIS — R2681 Unsteadiness on feet: Secondary | ICD-10-CM | POA: Diagnosis not present

## 2022-10-13 DIAGNOSIS — M6281 Muscle weakness (generalized): Secondary | ICD-10-CM | POA: Diagnosis not present

## 2022-10-14 ENCOUNTER — Telehealth: Payer: Self-pay | Admitting: Pulmonary Disease

## 2022-10-14 ENCOUNTER — Encounter: Payer: Self-pay | Admitting: Neurology

## 2022-10-14 DIAGNOSIS — R2681 Unsteadiness on feet: Secondary | ICD-10-CM | POA: Diagnosis not present

## 2022-10-14 DIAGNOSIS — J449 Chronic obstructive pulmonary disease, unspecified: Secondary | ICD-10-CM | POA: Diagnosis not present

## 2022-10-14 DIAGNOSIS — M6281 Muscle weakness (generalized): Secondary | ICD-10-CM | POA: Diagnosis not present

## 2022-10-14 NOTE — Telephone Encounter (Addendum)
Calling in regards to the medication Proair inhaler [phone]  Ref# [id number]

## 2022-10-15 ENCOUNTER — Encounter: Payer: Self-pay | Admitting: Neurology

## 2022-10-16 DIAGNOSIS — R2681 Unsteadiness on feet: Secondary | ICD-10-CM | POA: Diagnosis not present

## 2022-10-16 DIAGNOSIS — J449 Chronic obstructive pulmonary disease, unspecified: Secondary | ICD-10-CM | POA: Diagnosis not present

## 2022-10-16 DIAGNOSIS — M6281 Muscle weakness (generalized): Secondary | ICD-10-CM | POA: Diagnosis not present

## 2022-10-16 MED ORDER — ALBUTEROL SULFATE 108 (90 BASE) MCG/ACT IN AEPB: 2.0000 | INHALATION_SPRAY | Freq: Four times a day (QID) | RESPIRATORY_TRACT | 1 refills | Status: DC

## 2022-10-16 NOTE — Telephone Encounter (Signed)
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 Covered alternatives are: ProAir Respiclick, Ventolin HFA, Xopenex, or generic albuterol HFA.  Changed to ProAir Respiclick via verbal order.

## 2022-10-17 DIAGNOSIS — R2681 Unsteadiness on feet: Secondary | ICD-10-CM | POA: Diagnosis not present

## 2022-10-17 DIAGNOSIS — F411 Generalized anxiety disorder: Secondary | ICD-10-CM | POA: Diagnosis not present

## 2022-10-17 DIAGNOSIS — M6281 Muscle weakness (generalized): Secondary | ICD-10-CM | POA: Diagnosis not present

## 2022-10-17 DIAGNOSIS — F331 Major depressive disorder, recurrent, moderate: Secondary | ICD-10-CM | POA: Diagnosis not present

## 2022-10-21 DIAGNOSIS — J449 Chronic obstructive pulmonary disease, unspecified: Secondary | ICD-10-CM | POA: Diagnosis not present

## 2022-10-21 DIAGNOSIS — R2681 Unsteadiness on feet: Secondary | ICD-10-CM | POA: Diagnosis not present

## 2022-10-21 DIAGNOSIS — L304 Erythema intertrigo: Secondary | ICD-10-CM | POA: Diagnosis not present

## 2022-10-21 DIAGNOSIS — J441 Chronic obstructive pulmonary disease with (acute) exacerbation: Secondary | ICD-10-CM | POA: Diagnosis not present

## 2022-10-21 DIAGNOSIS — F411 Generalized anxiety disorder: Secondary | ICD-10-CM | POA: Diagnosis not present

## 2022-10-21 DIAGNOSIS — J9611 Chronic respiratory failure with hypoxia: Secondary | ICD-10-CM | POA: Diagnosis not present

## 2022-10-21 DIAGNOSIS — J019 Acute sinusitis, unspecified: Secondary | ICD-10-CM | POA: Diagnosis not present

## 2022-10-21 DIAGNOSIS — M6281 Muscle weakness (generalized): Secondary | ICD-10-CM | POA: Diagnosis not present

## 2022-10-22 DIAGNOSIS — M6281 Muscle weakness (generalized): Secondary | ICD-10-CM | POA: Diagnosis not present

## 2022-10-22 DIAGNOSIS — R2681 Unsteadiness on feet: Secondary | ICD-10-CM | POA: Diagnosis not present

## 2022-10-26 DIAGNOSIS — J449 Chronic obstructive pulmonary disease, unspecified: Secondary | ICD-10-CM | POA: Diagnosis not present

## 2022-10-26 DIAGNOSIS — M6281 Muscle weakness (generalized): Secondary | ICD-10-CM | POA: Diagnosis not present

## 2022-10-26 DIAGNOSIS — R2681 Unsteadiness on feet: Secondary | ICD-10-CM | POA: Diagnosis not present

## 2022-10-27 DIAGNOSIS — R2681 Unsteadiness on feet: Secondary | ICD-10-CM | POA: Diagnosis not present

## 2022-10-27 DIAGNOSIS — M6281 Muscle weakness (generalized): Secondary | ICD-10-CM | POA: Diagnosis not present

## 2022-10-28 DIAGNOSIS — Z23 Encounter for immunization: Secondary | ICD-10-CM | POA: Diagnosis not present

## 2022-10-30 DIAGNOSIS — M6281 Muscle weakness (generalized): Secondary | ICD-10-CM | POA: Diagnosis not present

## 2022-10-30 DIAGNOSIS — R2681 Unsteadiness on feet: Secondary | ICD-10-CM | POA: Diagnosis not present

## 2022-10-30 DIAGNOSIS — J029 Acute pharyngitis, unspecified: Secondary | ICD-10-CM | POA: Diagnosis not present

## 2022-10-30 DIAGNOSIS — J019 Acute sinusitis, unspecified: Secondary | ICD-10-CM | POA: Diagnosis not present

## 2022-10-30 DIAGNOSIS — J449 Chronic obstructive pulmonary disease, unspecified: Secondary | ICD-10-CM | POA: Diagnosis not present

## 2022-10-31 ENCOUNTER — Encounter (HOSPITAL_COMMUNITY): Payer: Medicare Other

## 2022-11-01 DIAGNOSIS — M6281 Muscle weakness (generalized): Secondary | ICD-10-CM | POA: Diagnosis not present

## 2022-11-01 DIAGNOSIS — J449 Chronic obstructive pulmonary disease, unspecified: Secondary | ICD-10-CM | POA: Diagnosis not present

## 2022-11-01 DIAGNOSIS — R2681 Unsteadiness on feet: Secondary | ICD-10-CM | POA: Diagnosis not present

## 2022-11-02 DIAGNOSIS — M6281 Muscle weakness (generalized): Secondary | ICD-10-CM | POA: Diagnosis not present

## 2022-11-02 DIAGNOSIS — R2681 Unsteadiness on feet: Secondary | ICD-10-CM | POA: Diagnosis not present

## 2022-11-02 DIAGNOSIS — J449 Chronic obstructive pulmonary disease, unspecified: Secondary | ICD-10-CM | POA: Diagnosis not present

## 2022-11-03 DIAGNOSIS — M6281 Muscle weakness (generalized): Secondary | ICD-10-CM | POA: Diagnosis not present

## 2022-11-03 DIAGNOSIS — R2681 Unsteadiness on feet: Secondary | ICD-10-CM | POA: Diagnosis not present

## 2022-11-04 ENCOUNTER — Encounter (HOSPITAL_BASED_OUTPATIENT_CLINIC_OR_DEPARTMENT_OTHER): Payer: Self-pay | Admitting: Pulmonary Disease

## 2022-11-05 DIAGNOSIS — J9611 Chronic respiratory failure with hypoxia: Secondary | ICD-10-CM | POA: Diagnosis not present

## 2022-11-05 DIAGNOSIS — J441 Chronic obstructive pulmonary disease with (acute) exacerbation: Secondary | ICD-10-CM | POA: Diagnosis not present

## 2022-11-05 DIAGNOSIS — E538 Deficiency of other specified B group vitamins: Secondary | ICD-10-CM | POA: Diagnosis not present

## 2022-11-05 DIAGNOSIS — J329 Chronic sinusitis, unspecified: Secondary | ICD-10-CM | POA: Diagnosis not present

## 2022-11-06 ENCOUNTER — Encounter (HOSPITAL_COMMUNITY)
Admission: RE | Admit: 2022-11-06 | Discharge: 2022-11-06 | Disposition: A | Discharge status: Home / Self Care | Payer: Medicare Other | Source: Ambulatory Visit | Attending: Pulmonary Disease | Admitting: Pulmonary Disease

## 2022-11-06 DIAGNOSIS — R911 Solitary pulmonary nodule: Secondary | ICD-10-CM | POA: Insufficient documentation

## 2022-11-06 LAB — GLUCOSE, CAPILLARY: Glucose-Capillary: 118 mg/dL — ABNORMAL HIGH (ref 70–99)

## 2022-11-06 MED ORDER — FLUDEOXYGLUCOSE F - 18 (FDG) INJECTION
11.8800 | Freq: Once | INTRAVENOUS | Status: AC
  Administered 2022-11-06: 11.88 via INTRAVENOUS

## 2022-11-09 DIAGNOSIS — R2681 Unsteadiness on feet: Secondary | ICD-10-CM | POA: Diagnosis not present

## 2022-11-09 DIAGNOSIS — M6281 Muscle weakness (generalized): Secondary | ICD-10-CM | POA: Diagnosis not present

## 2022-11-10 DIAGNOSIS — R2681 Unsteadiness on feet: Secondary | ICD-10-CM | POA: Diagnosis not present

## 2022-11-10 DIAGNOSIS — M6281 Muscle weakness (generalized): Secondary | ICD-10-CM | POA: Diagnosis not present

## 2022-11-12 DIAGNOSIS — R2681 Unsteadiness on feet: Secondary | ICD-10-CM | POA: Diagnosis not present

## 2022-11-12 DIAGNOSIS — J449 Chronic obstructive pulmonary disease, unspecified: Secondary | ICD-10-CM | POA: Diagnosis not present

## 2022-11-12 DIAGNOSIS — M6281 Muscle weakness (generalized): Secondary | ICD-10-CM | POA: Diagnosis not present

## 2022-11-12 NOTE — Telephone Encounter (Signed)
It has been delivered. She is asking if there was another company they can go to since [place] only has one driver for 6 counties and they do not want to run out.

## 2022-11-13 DIAGNOSIS — R2681 Unsteadiness on feet: Secondary | ICD-10-CM | POA: Diagnosis not present

## 2022-11-13 DIAGNOSIS — M6281 Muscle weakness (generalized): Secondary | ICD-10-CM | POA: Diagnosis not present

## 2022-11-13 DIAGNOSIS — J449 Chronic obstructive pulmonary disease, unspecified: Secondary | ICD-10-CM | POA: Diagnosis not present

## 2022-11-14 DIAGNOSIS — F331 Major depressive disorder, recurrent, moderate: Secondary | ICD-10-CM | POA: Diagnosis not present

## 2022-11-14 DIAGNOSIS — F411 Generalized anxiety disorder: Secondary | ICD-10-CM | POA: Diagnosis not present

## 2022-11-18 DIAGNOSIS — I10 Essential (primary) hypertension: Secondary | ICD-10-CM | POA: Diagnosis not present

## 2022-11-18 DIAGNOSIS — J441 Chronic obstructive pulmonary disease with (acute) exacerbation: Secondary | ICD-10-CM | POA: Diagnosis not present

## 2022-11-18 DIAGNOSIS — B351 Tinea unguium: Secondary | ICD-10-CM | POA: Diagnosis not present

## 2022-11-18 DIAGNOSIS — M2041 Other hammer toe(s) (acquired), right foot: Secondary | ICD-10-CM | POA: Diagnosis not present

## 2022-11-18 DIAGNOSIS — R059 Cough, unspecified: Secondary | ICD-10-CM | POA: Diagnosis not present

## 2022-11-18 DIAGNOSIS — R0602 Shortness of breath: Secondary | ICD-10-CM | POA: Diagnosis not present

## 2022-11-18 DIAGNOSIS — R300 Dysuria: Secondary | ICD-10-CM | POA: Diagnosis not present

## 2022-11-18 DIAGNOSIS — M79675 Pain in left toe(s): Secondary | ICD-10-CM | POA: Diagnosis not present

## 2022-11-18 DIAGNOSIS — E119 Type 2 diabetes mellitus without complications: Secondary | ICD-10-CM | POA: Diagnosis not present

## 2022-11-18 DIAGNOSIS — J019 Acute sinusitis, unspecified: Secondary | ICD-10-CM | POA: Diagnosis not present

## 2022-11-18 DIAGNOSIS — J9611 Chronic respiratory failure with hypoxia: Secondary | ICD-10-CM | POA: Diagnosis not present

## 2022-11-18 DIAGNOSIS — R350 Frequency of micturition: Secondary | ICD-10-CM | POA: Diagnosis not present

## 2022-11-19 ENCOUNTER — Inpatient Hospital Stay (HOSPITAL_COMMUNITY)
Admission: EM | Admit: 2022-11-19 | Discharge: 2022-11-25 | DRG: 871 | Disposition: A | Discharge status: Nursing Home (Includes ICF) | Payer: Medicare Other | Source: Skilled Nursing Facility | Attending: Internal Medicine | Admitting: Internal Medicine

## 2022-11-19 ENCOUNTER — Encounter (HOSPITAL_COMMUNITY): Payer: Self-pay

## 2022-11-19 ENCOUNTER — Other Ambulatory Visit: Payer: Self-pay

## 2022-11-19 ENCOUNTER — Emergency Department (HOSPITAL_COMMUNITY): Payer: Medicare Other

## 2022-11-19 DIAGNOSIS — G9349 Other encephalopathy: Secondary | ICD-10-CM | POA: Diagnosis present

## 2022-11-19 DIAGNOSIS — R918 Other nonspecific abnormal finding of lung field: Secondary | ICD-10-CM | POA: Diagnosis not present

## 2022-11-19 DIAGNOSIS — Z825 Family history of asthma and other chronic lower respiratory diseases: Secondary | ICD-10-CM

## 2022-11-19 DIAGNOSIS — Z1152 Encounter for screening for COVID-19: Secondary | ICD-10-CM | POA: Diagnosis not present

## 2022-11-19 DIAGNOSIS — J440 Chronic obstructive pulmonary disease with acute lower respiratory infection: Secondary | ICD-10-CM | POA: Diagnosis present

## 2022-11-19 DIAGNOSIS — E66813 Obesity, class 3: Secondary | ICD-10-CM | POA: Diagnosis present

## 2022-11-19 DIAGNOSIS — J9621 Acute and chronic respiratory failure with hypoxia: Secondary | ICD-10-CM | POA: Diagnosis present

## 2022-11-19 DIAGNOSIS — Z882 Allergy status to sulfonamides status: Secondary | ICD-10-CM

## 2022-11-19 DIAGNOSIS — Z888 Allergy status to other drugs, medicaments and biological substances status: Secondary | ICD-10-CM

## 2022-11-19 DIAGNOSIS — F32A Depression, unspecified: Secondary | ICD-10-CM | POA: Diagnosis present

## 2022-11-19 DIAGNOSIS — A419 Sepsis, unspecified organism: Principal | ICD-10-CM | POA: Diagnosis present

## 2022-11-19 DIAGNOSIS — Z8041 Family history of malignant neoplasm of ovary: Secondary | ICD-10-CM

## 2022-11-19 DIAGNOSIS — R4589 Other symptoms and signs involving emotional state: Secondary | ICD-10-CM | POA: Diagnosis not present

## 2022-11-19 DIAGNOSIS — E119 Type 2 diabetes mellitus without complications: Secondary | ICD-10-CM | POA: Diagnosis present

## 2022-11-19 DIAGNOSIS — Z515 Encounter for palliative care: Secondary | ICD-10-CM | POA: Diagnosis not present

## 2022-11-19 DIAGNOSIS — Z6841 Body Mass Index (BMI) 40.0 and over, adult: Secondary | ICD-10-CM | POA: Diagnosis not present

## 2022-11-19 DIAGNOSIS — D72823 Leukemoid reaction: Secondary | ICD-10-CM | POA: Diagnosis present

## 2022-11-19 DIAGNOSIS — J9620 Acute and chronic respiratory failure, unspecified whether with hypoxia or hypercapnia: Secondary | ICD-10-CM | POA: Diagnosis present

## 2022-11-19 DIAGNOSIS — J9600 Acute respiratory failure, unspecified whether with hypoxia or hypercapnia: Secondary | ICD-10-CM | POA: Diagnosis not present

## 2022-11-19 DIAGNOSIS — Z66 Do not resuscitate: Secondary | ICD-10-CM | POA: Diagnosis not present

## 2022-11-19 DIAGNOSIS — Z87891 Personal history of nicotine dependence: Secondary | ICD-10-CM | POA: Diagnosis not present

## 2022-11-19 DIAGNOSIS — Z832 Family history of diseases of the blood and blood-forming organs and certain disorders involving the immune mechanism: Secondary | ICD-10-CM

## 2022-11-19 DIAGNOSIS — R0602 Shortness of breath: Secondary | ICD-10-CM | POA: Diagnosis not present

## 2022-11-19 DIAGNOSIS — D72829 Elevated white blood cell count, unspecified: Secondary | ICD-10-CM | POA: Diagnosis present

## 2022-11-19 DIAGNOSIS — J441 Chronic obstructive pulmonary disease with (acute) exacerbation: Secondary | ICD-10-CM | POA: Diagnosis present

## 2022-11-19 DIAGNOSIS — D509 Iron deficiency anemia, unspecified: Secondary | ICD-10-CM | POA: Diagnosis present

## 2022-11-19 DIAGNOSIS — R6520 Severe sepsis without septic shock: Principal | ICD-10-CM | POA: Diagnosis present

## 2022-11-19 DIAGNOSIS — R0689 Other abnormalities of breathing: Secondary | ICD-10-CM | POA: Diagnosis not present

## 2022-11-19 DIAGNOSIS — R911 Solitary pulmonary nodule: Secondary | ICD-10-CM | POA: Diagnosis present

## 2022-11-19 DIAGNOSIS — Z7982 Long term (current) use of aspirin: Secondary | ICD-10-CM

## 2022-11-19 DIAGNOSIS — I10 Essential (primary) hypertension: Secondary | ICD-10-CM | POA: Diagnosis present

## 2022-11-19 DIAGNOSIS — J189 Pneumonia, unspecified organism: Secondary | ICD-10-CM | POA: Diagnosis present

## 2022-11-19 DIAGNOSIS — F0154 Vascular dementia, unspecified severity, with anxiety: Secondary | ICD-10-CM | POA: Diagnosis present

## 2022-11-19 DIAGNOSIS — Z881 Allergy status to other antibiotic agents status: Secondary | ICD-10-CM

## 2022-11-19 DIAGNOSIS — I700 Atherosclerosis of aorta: Secondary | ICD-10-CM | POA: Diagnosis not present

## 2022-11-19 DIAGNOSIS — F0153 Vascular dementia, unspecified severity, with mood disturbance: Secondary | ICD-10-CM | POA: Diagnosis present

## 2022-11-19 DIAGNOSIS — Z823 Family history of stroke: Secondary | ICD-10-CM

## 2022-11-19 DIAGNOSIS — E669 Obesity, unspecified: Secondary | ICD-10-CM | POA: Diagnosis present

## 2022-11-19 DIAGNOSIS — G40909 Epilepsy, unspecified, not intractable, without status epilepticus: Secondary | ICD-10-CM | POA: Diagnosis present

## 2022-11-19 DIAGNOSIS — E785 Hyperlipidemia, unspecified: Secondary | ICD-10-CM | POA: Diagnosis present

## 2022-11-19 DIAGNOSIS — R062 Wheezing: Secondary | ICD-10-CM | POA: Diagnosis not present

## 2022-11-19 DIAGNOSIS — I4891 Unspecified atrial fibrillation: Secondary | ICD-10-CM | POA: Diagnosis present

## 2022-11-19 DIAGNOSIS — Z9981 Dependence on supplemental oxygen: Secondary | ICD-10-CM | POA: Diagnosis not present

## 2022-11-19 DIAGNOSIS — J9622 Acute and chronic respiratory failure with hypercapnia: Secondary | ICD-10-CM | POA: Diagnosis present

## 2022-11-19 DIAGNOSIS — Z800 Family history of malignant neoplasm of digestive organs: Secondary | ICD-10-CM

## 2022-11-19 DIAGNOSIS — Z79899 Other long term (current) drug therapy: Secondary | ICD-10-CM

## 2022-11-19 DIAGNOSIS — Z1501 Genetic susceptibility to malignant neoplasm of breast: Secondary | ICD-10-CM

## 2022-11-19 DIAGNOSIS — T380X5A Adverse effect of glucocorticoids and synthetic analogues, initial encounter: Secondary | ICD-10-CM | POA: Diagnosis present

## 2022-11-19 DIAGNOSIS — Z8349 Family history of other endocrine, nutritional and metabolic diseases: Secondary | ICD-10-CM

## 2022-11-19 DIAGNOSIS — Z8673 Personal history of transient ischemic attack (TIA), and cerebral infarction without residual deficits: Secondary | ICD-10-CM

## 2022-11-19 DIAGNOSIS — Z7189 Other specified counseling: Secondary | ICD-10-CM | POA: Diagnosis not present

## 2022-11-19 DIAGNOSIS — Z803 Family history of malignant neoplasm of breast: Secondary | ICD-10-CM

## 2022-11-19 DIAGNOSIS — Z7401 Bed confinement status: Secondary | ICD-10-CM | POA: Diagnosis not present

## 2022-11-19 DIAGNOSIS — R000 Tachycardia, unspecified: Secondary | ICD-10-CM | POA: Diagnosis not present

## 2022-11-19 DIAGNOSIS — R61 Generalized hyperhidrosis: Secondary | ICD-10-CM | POA: Diagnosis not present

## 2022-11-19 DIAGNOSIS — R58 Hemorrhage, not elsewhere classified: Secondary | ICD-10-CM | POA: Diagnosis not present

## 2022-11-19 DIAGNOSIS — H9190 Unspecified hearing loss, unspecified ear: Secondary | ICD-10-CM | POA: Diagnosis present

## 2022-11-19 DIAGNOSIS — Z8262 Family history of osteoporosis: Secondary | ICD-10-CM

## 2022-11-19 DIAGNOSIS — Z880 Allergy status to penicillin: Secondary | ICD-10-CM

## 2022-11-19 LAB — CBC WITH DIFFERENTIAL/PLATELET
Abs Immature Granulocytes: 0.54 10*3/uL — ABNORMAL HIGH (ref 0.00–0.07)
Basophils Absolute: 0.1 10*3/uL (ref 0.0–0.1)
Basophils Relative: 0 %
Eosinophils Absolute: 0 10*3/uL (ref 0.0–0.5)
Eosinophils Relative: 0 %
HCT: 39.3 % (ref 36.0–46.0)
Hemoglobin: 12.3 g/dL (ref 12.0–15.0)
Immature Granulocytes: 2 %
Lymphocytes Relative: 10 %
Lymphs Abs: 2.6 10*3/uL (ref 0.7–4.0)
MCH: 33.3 pg (ref 26.0–34.0)
MCHC: 31.3 g/dL (ref 30.0–36.0)
MCV: 106.5 fL — ABNORMAL HIGH (ref 80.0–100.0)
Monocytes Absolute: 2.8 10*3/uL — ABNORMAL HIGH (ref 0.1–1.0)
Monocytes Relative: 11 %
Neutro Abs: 19.9 10*3/uL — ABNORMAL HIGH (ref 1.7–7.7)
Neutrophils Relative %: 77 %
Platelets: 316 10*3/uL (ref 150–400)
RBC: 3.69 MIL/uL — ABNORMAL LOW (ref 3.87–5.11)
RDW: 13.2 % (ref 11.5–15.5)
WBC: 25.9 10*3/uL — ABNORMAL HIGH (ref 4.0–10.5)
nRBC: 0 % (ref 0.0–0.2)

## 2022-11-19 LAB — GLUCOSE, CAPILLARY
Glucose-Capillary: 172 mg/dL — ABNORMAL HIGH (ref 70–99)
Glucose-Capillary: 155 mg/dL — ABNORMAL HIGH (ref 70–99)
Glucose-Capillary: 166 mg/dL — ABNORMAL HIGH (ref 70–99)

## 2022-11-19 LAB — CBC
HCT: 34.1 % — ABNORMAL LOW (ref 36.0–46.0)
Hemoglobin: 10.9 g/dL — ABNORMAL LOW (ref 12.0–15.0)
MCH: 33.4 pg (ref 26.0–34.0)
MCHC: 32 g/dL (ref 30.0–36.0)
MCV: 104.6 fL — ABNORMAL HIGH (ref 80.0–100.0)
Platelets: 259 10*3/uL (ref 150–400)
RBC: 3.26 MIL/uL — ABNORMAL LOW (ref 3.87–5.11)
RDW: 13 % (ref 11.5–15.5)
WBC: 20.3 10*3/uL — ABNORMAL HIGH (ref 4.0–10.5)
nRBC: 0 % (ref 0.0–0.2)

## 2022-11-19 LAB — COMPREHENSIVE METABOLIC PANEL
ALT: 22 U/L (ref 0–44)
AST: 23 U/L (ref 15–41)
Albumin: 3.4 g/dL — ABNORMAL LOW (ref 3.5–5.0)
Alkaline Phosphatase: 66 U/L (ref 38–126)
Anion gap: 9 (ref 5–15)
BUN: 8 mg/dL (ref 8–23)
CO2: 31 mmol/L (ref 22–32)
Calcium: 8.8 mg/dL — ABNORMAL LOW (ref 8.9–10.3)
Chloride: 97 mmol/L — ABNORMAL LOW (ref 98–111)
Creatinine, Ser: 0.44 mg/dL (ref 0.44–1.00)
GFR, Estimated: >60 mL/min (ref >60)
Glucose, Bld: 161 mg/dL — ABNORMAL HIGH (ref 70–99)
Potassium: 4.7 mmol/L (ref 3.5–5.1)
Sodium: 137 mmol/L (ref 135–145)
Total Bilirubin: 0.5 mg/dL (ref <1.2)
Total Protein: 8.1 g/dL (ref 6.5–8.1)

## 2022-11-19 LAB — BLOOD GAS, VENOUS
Acid-Base Excess: 6.1 mmol/L — ABNORMAL HIGH (ref 0.0–2.0)
Bicarbonate: 38.3 mmol/L — ABNORMAL HIGH (ref 20.0–28.0)
O2 Saturation: 96.2 %
Patient temperature: 37
pCO2, Ven: 105 mm[Hg] (ref 44–60)
pH, Ven: 7.17 — CL (ref 7.25–7.43)
pO2, Ven: 82 mm[Hg] — ABNORMAL HIGH (ref 32–45)

## 2022-11-19 LAB — PROTIME-INR
INR: 1 (ref 0.8–1.2)
Prothrombin Time: 13.8 s (ref 11.4–15.2)

## 2022-11-19 LAB — I-STAT CG4 LACTIC ACID, ED: Lactic Acid, Venous: 0.5 mmol/L (ref 0.5–1.9)

## 2022-11-19 LAB — MRSA NEXT GEN BY PCR, NASAL: MRSA by PCR Next Gen: NOT DETECTED

## 2022-11-19 LAB — CREATININE, SERUM
Creatinine, Ser: 0.62 mg/dL (ref 0.44–1.00)
GFR, Estimated: >60 mL/min (ref >60)

## 2022-11-19 LAB — TROPONIN I (HIGH SENSITIVITY)
Troponin I (High Sensitivity): 4 ng/L (ref <18)
Troponin I (High Sensitivity): 5 ng/L (ref <18)

## 2022-11-19 LAB — LACTIC ACID, PLASMA: Lactic Acid, Venous: 1.2 mmol/L (ref 0.5–1.9)

## 2022-11-19 LAB — APTT: aPTT: 31 s (ref 24–36)

## 2022-11-19 LAB — BRAIN NATRIURETIC PEPTIDE: B Natriuretic Peptide: 191.9 pg/mL — ABNORMAL HIGH (ref 0.0–100.0)

## 2022-11-19 LAB — SARS CORONAVIRUS 2 BY RT PCR: SARS Coronavirus 2 by RT PCR: NEGATIVE

## 2022-11-19 LAB — PROCALCITONIN: Procalcitonin: <0.1 ng/mL

## 2022-11-19 MED ORDER — FLUTICASONE PROPIONATE 50 MCG/ACT NA SUSP
2.0000 | Freq: Every day | NASAL | Status: DC
Start: 2022-11-19 — End: 2022-11-25
  Administered 2022-11-19 – 2022-11-25 (×7): 2 via NASAL
  Filled 2022-11-19: qty 16

## 2022-11-19 MED ORDER — ASPIRIN 81 MG PO TBEC
81.0000 mg | DELAYED_RELEASE_TABLET | Freq: Every day | ORAL | Status: DC
  Administered 2022-11-19 – 2022-11-25 (×7): 81 mg via ORAL
  Filled 2022-11-19 (×7): qty 1

## 2022-11-19 MED ORDER — LABETALOL HCL 5 MG/ML IV SOLN
10.0000 mg | INTRAVENOUS | Status: DC | PRN
  Administered 2022-11-19 – 2022-11-20 (×3): 10 mg via INTRAVENOUS
  Filled 2022-11-19 (×4): qty 4

## 2022-11-19 MED ORDER — ATORVASTATIN CALCIUM 20 MG PO TABS
20.0000 mg | ORAL_TABLET | Freq: Every day | ORAL | Status: DC
  Administered 2022-11-20 – 2022-11-25 (×6): 20 mg via ORAL
  Filled 2022-11-19 (×6): qty 1

## 2022-11-19 MED ORDER — HEPARIN SODIUM (PORCINE) 5000 UNIT/ML IJ SOLN
5000.0000 [IU] | Freq: Three times a day (TID) | INTRAMUSCULAR | Status: DC
  Administered 2022-11-19 – 2022-11-25 (×18): 5000 [IU] via SUBCUTANEOUS
  Filled 2022-11-19 (×18): qty 1

## 2022-11-19 MED ORDER — ACETAMINOPHEN 325 MG PO TABS
325.0000 mg | ORAL_TABLET | ORAL | Status: DC | PRN
  Administered 2022-11-20: 650 mg via ORAL
  Administered 2022-11-24: 325 mg via ORAL
  Administered 2022-11-24: 650 mg via ORAL
  Filled 2022-11-19 (×3): qty 2

## 2022-11-19 MED ORDER — METHYLPREDNISOLONE SODIUM SUCC 40 MG IJ SOLR
40.0000 mg | Freq: Every day | INTRAMUSCULAR | Status: DC
  Administered 2022-11-19 – 2022-11-20 (×2): 40 mg via INTRAVENOUS
  Filled 2022-11-19 (×2): qty 1

## 2022-11-19 MED ORDER — DEXTROSE 5 % IV SOLN
500.0000 mg | INTRAVENOUS | Status: DC
  Administered 2022-11-19: 500 mg via INTRAVENOUS
  Filled 2022-11-19: qty 5

## 2022-11-19 MED ORDER — GUAIFENESIN ER 600 MG PO TB12
1200.0000 mg | ORAL_TABLET | Freq: Two times a day (BID) | ORAL | Status: DC
  Administered 2022-11-19: 1200 mg via ORAL
  Filled 2022-11-19: qty 2

## 2022-11-19 MED ORDER — AZITHROMYCIN 500 MG IV SOLR
500.0000 mg | Freq: Once | INTRAVENOUS | Status: AC
  Administered 2022-11-19: 500 mg via INTRAVENOUS
  Filled 2022-11-19: qty 5

## 2022-11-19 MED ORDER — ARFORMOTEROL TARTRATE 15 MCG/2ML IN NEBU
15.0000 ug | INHALATION_SOLUTION | Freq: Two times a day (BID) | RESPIRATORY_TRACT | Status: DC
  Administered 2022-11-19 – 2022-11-25 (×12): 15 ug via RESPIRATORY_TRACT
  Filled 2022-11-19 (×12): qty 2

## 2022-11-19 MED ORDER — ESCITALOPRAM OXALATE 20 MG PO TABS
20.0000 mg | ORAL_TABLET | Freq: Every day | ORAL | Status: DC
  Administered 2022-11-20 – 2022-11-25 (×6): 20 mg via ORAL
  Filled 2022-11-19 (×6): qty 1

## 2022-11-19 MED ORDER — FERROUS SULFATE 325 (65 FE) MG PO TABS
324.0000 mg | ORAL_TABLET | Freq: Every day | ORAL | Status: DC
  Administered 2022-11-20 – 2022-11-25 (×6): 324 mg via ORAL
  Filled 2022-11-19 (×6): qty 1

## 2022-11-19 MED ORDER — PANTOPRAZOLE SODIUM 40 MG PO TBEC
40.0000 mg | DELAYED_RELEASE_TABLET | Freq: Two times a day (BID) | ORAL | Status: DC
  Administered 2022-11-19 – 2022-11-25 (×12): 40 mg via ORAL
  Filled 2022-11-19 (×12): qty 1

## 2022-11-19 MED ORDER — LACTATED RINGERS IV SOLN: INTRAVENOUS | Status: DC

## 2022-11-19 MED ORDER — CHLORHEXIDINE GLUCONATE CLOTH 2 % EX PADS
6.0000 | MEDICATED_PAD | Freq: Every day | CUTANEOUS | Status: DC
  Administered 2022-11-20: 6 via TOPICAL

## 2022-11-19 MED ORDER — VITAMIN D 25 MCG (1000 UNIT) PO TABS
2000.0000 [IU] | ORAL_TABLET | Freq: Every day | ORAL | Status: DC
  Administered 2022-11-20 – 2022-11-25 (×6): 2000 [IU] via ORAL
  Filled 2022-11-19 (×6): qty 2

## 2022-11-19 MED ORDER — LEVETIRACETAM 500 MG PO TABS
750.0000 mg | ORAL_TABLET | Freq: Two times a day (BID) | ORAL | Status: DC
  Administered 2022-11-19 – 2022-11-25 (×12): 750 mg via ORAL
  Filled 2022-11-19 (×12): qty 1

## 2022-11-19 MED ORDER — POLYETHYLENE GLYCOL 3350 17 G PO PACK
17.0000 g | PACK | Freq: Every day | ORAL | Status: DC | PRN
  Filled 2022-11-19: qty 1

## 2022-11-19 MED ORDER — HYDRALAZINE HCL 20 MG/ML IJ SOLN: 10.0000 mg | Freq: Four times a day (QID) | INTRAMUSCULAR | Status: DC | PRN

## 2022-11-19 MED ORDER — ACETAMINOPHEN 500 MG PO TABS
1000.0000 mg | ORAL_TABLET | Freq: Once | ORAL | Status: AC
  Administered 2022-11-19: 1000 mg via ORAL
  Filled 2022-11-19: qty 2

## 2022-11-19 MED ORDER — GABAPENTIN 300 MG PO CAPS
300.0000 mg | ORAL_CAPSULE | Freq: Every day | ORAL | Status: DC
  Administered 2022-11-19 – 2022-11-24 (×6): 300 mg via ORAL
  Filled 2022-11-19 (×6): qty 1

## 2022-11-19 MED ORDER — DOCUSATE SODIUM 100 MG PO CAPS
100.0000 mg | ORAL_CAPSULE | Freq: Two times a day (BID) | ORAL | Status: DC | PRN
  Administered 2022-11-23: 100 mg via ORAL
  Filled 2022-11-19: qty 1

## 2022-11-19 MED ORDER — SODIUM CHLORIDE 0.9 % IV SOLN
2.0000 g | Freq: Three times a day (TID) | INTRAVENOUS | Status: DC
  Administered 2022-11-19 – 2022-11-23 (×13): 2 g via INTRAVENOUS
  Filled 2022-11-19 (×13): qty 12.5

## 2022-11-19 MED ORDER — VITAMIN B-12 100 MCG PO TABS
500.0000 ug | ORAL_TABLET | Freq: Every day | ORAL | Status: DC
  Administered 2022-11-20 – 2022-11-25 (×6): 500 ug via ORAL
  Filled 2022-11-19 (×3): qty 5
  Filled 2022-11-19 (×2): qty 1
  Filled 2022-11-19: qty 5

## 2022-11-19 MED ORDER — BUDESONIDE 0.5 MG/2ML IN SUSP
0.5000 mg | Freq: Two times a day (BID) | RESPIRATORY_TRACT | Status: DC
  Administered 2022-11-19 – 2022-11-25 (×12): 0.5 mg via RESPIRATORY_TRACT
  Filled 2022-11-19 (×12): qty 2

## 2022-11-19 MED ORDER — REVEFENACIN 175 MCG/3ML IN SOLN
175.0000 ug | Freq: Every day | RESPIRATORY_TRACT | Status: DC
Start: 2022-11-19 — End: 2022-11-25
  Administered 2022-11-20 – 2022-11-25 (×6): 175 ug via RESPIRATORY_TRACT
  Filled 2022-11-19 (×6): qty 3

## 2022-11-19 MED ORDER — SODIUM CHLORIDE 0.9 % IV SOLN
2.0000 g | Freq: Once | INTRAVENOUS | Status: AC
  Administered 2022-11-19: 2 g via INTRAVENOUS
  Filled 2022-11-19: qty 12.5

## 2022-11-19 NOTE — ED Notes (Addendum)
Respiratory called to bedside due to patients oxygen being 82% on bipap. Lisa from Respiratory increased FiO2 from 28% to 32% on Bipap.

## 2022-11-19 NOTE — Progress Notes (Signed)
Rt increased O2 to 40% due to low sats.

## 2022-11-19 NOTE — ED Provider Notes (Signed)
[place] EMERGENCY DEPARTMENT AT [facility] Provider Note   CSN: [id number] Arrival date & time: 11/19/22  0847     History  Chief Complaint  Patient presents with   Respiratory Distress    Beth [name] is a 82 y.o. female.  HPI Female arrives via EMS in respiratory distress. Patient is diaphoretic, speaking minimally.  History per EMS. Reportedly the patient began having dyspnea yesterday, and on arrival patient was dyspneic, tachycardic, tachypneic with increased work of breathing.  EMS reports she was initially oriented x 3, but has become less so during transport.  She received albuterol, Atrovent, Solu-Medrol.    Home Medications Prior to Admission medications   Medication Sig Start Date End Date Taking? Authorizing Provider  acetaminophen (TYLENOL) 325 MG tablet Take 1-2 tablets (325-650 mg total) by mouth every 4 (four) hours as needed for mild pain. 06/03/19   Love, Evlyn Kanner, PA-C  Albuterol Sulfate (PROAIR RESPICLICK) 108 (90 Base) MCG/ACT AEPB Inhale 2 puffs into the lungs every 6 (six) hours. 10/16/22   Oretha Milch, MD  aspirin EC 81 MG tablet Take 81 mg by mouth daily.    [provider]  atorvastatin (LIPITOR) 20 MG tablet TAKE 1 TABLET DAILY 02/10/22   Maeola Harman, MD  azelastine (ASTELIN) 0.1 % nasal spray Place 1 spray into both nostrils 2 (two) times daily. Use in each nostril as directed 08/20/22   Oretha Milch, MD  captopril (CAPOTEN) 50 MG tablet Take 50 mg by mouth 2 (two) times daily.    [provider]  Cholecalciferol (VITAMIN D3) 2000 units capsule Take 2,000 Units by mouth daily.     [provider]  clonazePAM (KLONOPIN) 0.5 MG tablet TAKE 1 TABLET BY MOUTH EVERY NIGHT AT BEDTIME AS NEEDED FOR ANXIETY AND INSOMNIA 07/28/22   Penumalli, Glenford Bayley, MD  diltiazem (CARDIZEM CD) 360 MG 24 hr capsule TAKE 1 CAPSULE DAILY 02/03/22   Corky Crafts, MD  ferrous sulfate 324 MG TBEC Take 324 mg by mouth  daily with breakfast.    [provider]  fluticasone (FLONASE) 50 MCG/ACT nasal spray Place 1 spray into the nose daily as needed for allergies. Spray twice daily as needed    [provider]  fluticasone-salmeterol (ADVAIR HFA) 230-21 MCG/ACT inhaler Inhale 2 puffs into the lungs 2 (two) times daily. 10/10/22   Oretha Milch, MD  gabapentin (NEURONTIN) 600 MG tablet TAKE ONE-HALF (1/2) TABLET AT BEDTIME 05/28/22   Dohmeier, Porfirio Mylar, MD  guaiFENesin (MUCINEX) 600 MG 12 hr tablet Take 1,200 mg by mouth 2 (two) times daily.    [provider]  levETIRAcetam (KEPPRA) 750 MG tablet Take 1 tablet (750 mg total) by mouth 2 (two) times daily. 05/28/22   Dohmeier, Porfirio Mylar, MD  LEXAPRO 20 MG tablet Take 1 tablet (20 mg total) by mouth daily. Morning dosage 04/21/13   Dohmeier, Porfirio Mylar, MD  metoprolol tartrate (LOPRESSOR) 25 MG tablet Take 25 mg by mouth 2 (two) times daily as needed. 05/02/20   [provider]  MICONAZOLE NITRATE EX Apply topically.    [provider]  nystatin cream (MYCOSTATIN) Apply 1 Application topically 2 (two) times daily. 09/27/21   [provider]  pantoprazole (PROTONIX) 40 MG tablet Take 40 mg by mouth 2 (two) times daily. 11/05/21   [provider]  potassium chloride (KLOR-CON M) 10 MEQ tablet Take 1 tablet (10 mEq total) by mouth daily for 5 days. 11/28/21 12/03/21  Cobb, Natalia Leatherwood  V, NP  sodium chloride HYPERTONIC 3 % nebulizer solution Take by nebulization in the morning and at bedtime. 03/08/21   Nyoka Cowden, MD  Spacer/Aero-Holding Chambers (AEROCHAMBER PLUS WITH MASK) inhaler As needed with inhaler 12/27/19   Oretha Milch, MD  vitamin B-12 (CYANOCOBALAMIN) 500 MCG tablet Take 1 tablet (500 mcg total) by mouth daily. 06/04/19   Love, Evlyn Kanner, PA-C  YUPELRI 175 MCG/3ML nebulizer solution INHALE CONTENTS OF 1 VIAL (3 ML, 175 MCG) VIA NEBULIZER DAILY 10/09/22   Cobb, Ruby Cola, NP      Allergies    Prozac  [fluoxetine hcl], Sulfa antibiotics, Micardis [telmisartan], Prednisone, Allegra [fexofenadine], Prozac [fluoxetine], Augmentin [amoxicillin-pot clavulanate], Avelox [moxifloxacin hcl in nacl], Lisinopril-hydrochlorothiazide, and Norvasc [amlodipine besylate]    Review of Systems   Review of Systems  Physical Exam Updated Vital Signs BP (!) 98/57   Pulse 92   Temp 98.1 F (36.7 C) (Axillary)   Resp 20   Ht 5\' 4"  (1.626 m)   Wt 108.9 kg   LMP 01/14/1995 (Approximate)   SpO2 94%   BMI 41.21 kg/m  Physical Exam Vitals and nursing note reviewed.  Constitutional:      General: She is in acute distress.     Appearance: She is well-developed. She is obese. She is ill-appearing and diaphoretic.  HENT:     Head: Normocephalic and atraumatic.  Eyes:     Conjunctiva/sclera: Conjunctivae normal.  Cardiovascular:     Rate and Rhythm: Regular rhythm. Tachycardia present.  Pulmonary:     Effort: Respiratory distress present.     Breath sounds: No stridor. Wheezing present.  Abdominal:     Tenderness: There is no guarding.  Musculoskeletal:     Comments: Trace pitting edema bilaterally  Skin:    General: Skin is warm.  Neurological:     Mental Status: She is alert.     Cranial Nerves: No cranial nerve deficit.  Psychiatric:     Comments: Listless     ED Results / Procedures / Treatments   Labs (all labs ordered are listed, but only abnormal results are displayed) Labs Reviewed  COMPREHENSIVE METABOLIC PANEL - Abnormal; Notable for the following components:      Result Value   Chloride 97 (*)    Glucose, Bld 161 (*)    Calcium 8.8 (*)    Albumin 3.4 (*)    All other components within normal limits  CBC WITH DIFFERENTIAL/PLATELET - Abnormal; Notable for the following components:   WBC 25.9 (*)    RBC 3.69 (*)    MCV 106.5 (*)    Neutro Abs 19.9 (*)    Monocytes Absolute 2.8 (*)    Abs Immature Granulocytes 0.54 (*)    All other components within normal limits  BRAIN  NATRIURETIC PEPTIDE - Abnormal; Notable for the following components:   B Natriuretic Peptide 191.9 (*)    All other components within normal limits  BLOOD GAS, VENOUS - Abnormal; Notable for the following components:   pH, Ven 7.17 (*)    pCO2, Ven 105 (*)    pO2, Ven 82 (*)    Bicarbonate 38.3 (*)    Acid-Base Excess 6.1 (*)    All other components within normal limits  SARS CORONAVIRUS 2 BY RT PCR  CULTURE, BLOOD (ROUTINE X 2)  CULTURE, BLOOD (ROUTINE X 2)  MRSA NEXT GEN BY PCR, NASAL  PROTIME-INR  APTT  URINALYSIS, W/ REFLEX TO CULTURE (INFECTION SUSPECTED)  I-STAT CG4 LACTIC ACID, ED  TROPONIN I (HIGH SENSITIVITY)  TROPONIN I (HIGH SENSITIVITY)    EKG EKG Interpretation Date/Time:  Wednesday November 19 2022 08:54:03 EST Ventricular Rate:  86 PR Interval:    QRS Duration:  91 QT Interval:  356 QTC Calculation: 426 R Axis:   -77  Text Interpretation: Atrial fibrillation Anterolateral infarct, old Confirmed by Gerhard Munch [phone]) on 11/19/2022 9:16:17 AM  Radiology DG Chest Port 1 View  Result Date: 11/19/2022 CLINICAL DATA:  Questionable sepsis.  Shortness of breath. EXAM: PORTABLE CHEST 1 VIEW COMPARISON:  02/03/2022; 11/25/2021; PET-CT-11/08/2022; chest CT-01/28/2022 FINDINGS: Unchanged cardiac silhouette and mediastinal contours with atherosclerotic plaque within the aortic arch. Potential developing airspace opacity within the peripheral aspect of the right mid lung. Known right lower lung pulmonary nodules not well demonstrated on this AP portable examination. Unchanged punctate left upper lung granuloma. Trace bilateral effusions are not excluded. No pneumothorax. No evidence of edema. No acute osseous abnormalities. IMPRESSION: 1. Potential developing airspace opacity within the peripheral aspect of the right mid lung, potentially atelectasis though worrisome for developing infection. 2. Known right lower lung pulmonary nodules not well demonstrated on this AP  portable examination. Electronically Signed   By: Simonne Come M.D.   On: 11/19/2022 10:57    Procedures Procedures    Medications Ordered in ED Medications  ceFEPIme (MAXIPIME) 2 g in sodium chloride 0.9 % 100 mL IVPB (2 g Intravenous New Bag/Given 11/19/22 1015)  azithromycin (ZITHROMAX) 500 mg in dextrose 5 % 250 mL IVPB (500 mg Intravenous New Bag/Given 11/19/22 1016)    ED Course/ Medical Decision Making/ A&P                                 Medical Decision Making Adult female with multiple medical issues including COPD, CHF, presents in respiratory distress.  Broad differential including bacteremia, sepsis, pneumonia, COPD exacerbation, CHF exacerbation, likely multifactorial.  Patient transition to BiPAP after arrival, continue to receive continuous monitoring. Cardiac 90' s A-fib Pulse ox 90% with BiPAP abnormal   Amount and/or Complexity of Data Reviewed Independent Historian: EMS    Details: EMS rhythm strip 88 afib External Data Reviewed: notes. Labs: ordered. Decision-making details documented in ED Course. Radiology: ordered and independent interpretation performed. Decision-making details documented in ED Course. ECG/medicine tests: ordered and independent interpretation performed. Decision-making details documented in ED Course.  Risk Prescription drug management. Decision regarding hospitalization. Diagnosis or treatment significantly limited by social determinants of health.   Update: Family now present, they note that yesterday she was informed at her facility that she has pneumonia on the x-ray. 11:44 AM Patient's mentation has improved markedly.  However, the patient continues to have mild hypoxia, requiring increased oxygen supplementation.  With this patient has improved saturation as well.  Given her need for persistent BiPAP support, sepsis, with acute respiratory compromise, patient will require admission to the ICU.  Patient has received broad-spectrum  antibiotics, previously steroids, bronchodilators for resuscitation.  Patient without clinical evidence for hypovolemia, maps remained greater than 70, and with her story of heart failure, elevated BNP, fluids being provided judiciously.  Absence of lactic acidosis was reassuring in this regard.  Final Clinical Impression(s) / ED Diagnoses Final diagnoses:  Severe sepsis (HCC)    CRITICAL CARE Performed by: Gerhard Munch Total critical care time: 35 minutes Critical care time was exclusive of separately billable procedures and treating other patients. Critical care was necessary to treat or prevent imminent  or life-threatening deterioration. Critical care was time spent personally by me on the following activities: development of treatment plan with patient and/or surrogate as well as nursing, discussions with consultants, evaluation of patient's response to treatment, examination of patient, obtaining history from patient or surrogate, ordering and performing treatments and interventions, ordering and review of laboratory studies, ordering and review of radiographic studies, pulse oximetry and re-evaluation of patient's condition.    Gerhard Munch, MD 11/19/22 1146

## 2022-11-19 NOTE — ED Notes (Signed)
ED TO INPATIENT HANDOFF REPORT  Name/Age/Gender Beth [name] 82 y.o. female  Code Status    Code Status Orders  (From admission, onward)           Start     Ordered   11/19/22 1410  Do not attempt resuscitation (DNR) Pre-Arrest Interventions Desired  Continuous       Question Answer Comment  If pulseless and not breathing No CPR or chest compressions.   In Pre-Arrest Conditions (Patient Has Pulse and Is Breathing) May intubate, use advanced airway interventions and cardioversion/ACLS medications if appropriate or indicated. May transfer to ICU.   Consent: Discussion documented in EHR or advanced directives reviewed      11/19/22 1414           Code Status History     Date Active Date Inactive Code Status Order ID Comments User Context   06/20/2019 1204 06/22/2019 1830 Full Code [id number]  Chuck Hint, MD Inpatient   05/24/2019 1517 06/06/2019 1645 Full Code [id number]  Jerene Pitch Inpatient   05/16/2019 1301 05/24/2019 1438 Full Code [id number]  Ollen Bowl, MD ED       Home/SNF/Other Home  Chief Complaint Acute on chronic respiratory failure (HCC) [J96.20]  Level of Care/Admitting Diagnosis ED Disposition     ED Disposition  Admit   Condition  --   Comment  Hospital Area: [facility] [[id number]]  Level of Care: ICU [6]  May admit patient to [facility] or [facility] if equivalent level of care is available:: No  Covid Evaluation: Confirmed COVID Negative  Diagnosis: Acute on chronic respiratory failure [facility]) [[id number]]  Admitting Physician: Chilton Greathouse [[id number]]  Attending Physician: Chilton Greathouse [[id number]]  Certification:: I certify this patient will need inpatient services for at least 2 midnights  Expected Medical Readiness: 11/24/2022          Medical History Past Medical History:  Diagnosis Date   Afib (HCC)    diagnosed 05/17/19   Arthritis    BRCA2 positive 2017   COPD (chronic obstructive  pulmonary disease) (HCC)    Diabetes mellitus without complication (HCC)    Diverticulosis    Dysrhythmia    Afib   Hearing loss    HTN (hypertension)    Memory loss    Pre-diabetes    Seasonal depression (HCC)    Seizure disorder, complex partial, 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    Vestibulitis of ear     Allergies Allergies  Allergen Reactions   Prozac [Fluoxetine Hcl] Other (See Comments)    seizures   Sulfa Antibiotics Nausea And Vomiting and Other (See Comments)    TINGLING IN LEGS ALSO   Micardis [Telmisartan] Swelling and Other (See Comments)    Swelling, bruising Swelling, bruising   Prednisone Other (See Comments)    mood changes Mood changes   Allegra [Fexofenadine]     Makes her sleep 24 hours    Prozac [Fluoxetine]     seizure   Augmentin [Amoxicillin-Pot Clavulanate] Rash   Avelox [Moxifloxacin Hcl In Nacl] Rash    LEVAQUIN IS OK-SEE NOTE   Lisinopril-Hydrochlorothiazide Other (See Comments)    unknown   Norvasc [Amlodipine Besylate] Other (See Comments)    fatigue    IV Location/Drains/Wounds Patient Lines/Drains/Airways Status  Active Line/Drains/Airways     Name Placement date Placement time Site Days   Peripheral IV 11/19/22 20 G Left;Posterior Wrist 11/19/22  0858  Wrist  less than 1   Peripheral IV 11/19/22 20 G Posterior;Right Hand 11/19/22  0907  Hand  less than 1   Peripheral IV 11/19/22 20 G Right Antecubital 11/19/22  0908  Antecubital  less than 1            Labs/Imaging Results for orders placed or performed during the hospital encounter of 11/19/22 (from the past 48 hour(s))  Comprehensive metabolic panel     Status: Abnormal   Collection Time: 11/19/22  9:01 AM  Result Value Ref Range   Sodium 137 135 - 145 mmol/L   Potassium 4.7 3.5 - 5.1 mmol/L   Chloride 97  (L) 98 - 111 mmol/L   CO2 31 22 - 32 mmol/L   Glucose, Bld 161 (H) 70 - 99 mg/dL    Comment: Glucose reference range applies only to samples taken after fasting for at least 8 hours.   BUN 8 8 - 23 mg/dL   Creatinine, Ser 1.61 0.44 - 1.00 mg/dL   Calcium 8.8 (L) 8.9 - 10.3 mg/dL   Total Protein 8.1 6.5 - 8.1 g/dL   Albumin 3.4 (L) 3.5 - 5.0 g/dL   AST 23 15 - 41 U/L   ALT 22 0 - 44 U/L   Alkaline Phosphatase 66 38 - 126 U/L   Total Bilirubin 0.5 <1.2 mg/dL   GFR, Estimated >09 >60 mL/min    Comment: (NOTE) Calculated using the CKD-EPI Creatinine Equation (2021)    Anion gap 9 5 - 15    Comment: Performed at [facility], [address], [place], [place] [address]  CBC with Differential     Status: Abnormal   Collection Time: 11/19/22  9:01 AM  Result Value Ref Range   WBC 25.9 (H) 4.0 - 10.5 K/uL    Comment: WHITE COUNT CONFIRMED ON SMEAR   RBC 3.69 (L) 3.87 - 5.11 MIL/uL   Hemoglobin 12.3 12.0 - 15.0 g/dL   HCT 81.1 91.4 - 78.2 %   MCV 106.5 (H) 80.0 - 100.0 fL   MCH 33.3 26.0 - 34.0 pg   MCHC 31.3 30.0 - 36.0 g/dL   RDW 95.6 21.3 - 08.6 %   Platelets 316 150 - 400 K/uL   nRBC 0.0 0.0 - 0.2 %   Neutrophils Relative % 77 %   Neutro Abs 19.9 (H) 1.7 - 7.7 K/uL   Lymphocytes Relative 10 %   Lymphs Abs 2.6 0.7 - 4.0 K/uL   Monocytes Relative 11 %   Monocytes Absolute 2.8 (H) 0.1 - 1.0 K/uL   Eosinophils Relative 0 %   Eosinophils Absolute 0.0 0.0 - 0.5 K/uL   Basophils Relative 0 %   Basophils Absolute 0.1 0.0 - 0.1 K/uL   Immature Granulocytes 2 %   Abs Immature Granulocytes 0.54 (H) 0.00 - 0.07 K/uL   Reactive, Benign Lymphocytes PRESENT    Polychromasia PRESENT     Comment: Performed at [facility], [address], [place], [place] [address]  Protime-INR     Status: None   Collection Time: 11/19/22  9:01 AM  Result Value Ref Range   Prothrombin Time 13.8 11.4 - 15.2 seconds   INR 1.0 0.8 - 1.2    Comment: (NOTE) INR goal  varies based on device and disease states. Performed at [facility],  [address] [place], [place] [address]   APTT     Status: None   Collection Time: 11/19/22  9:01 AM  Result Value Ref Range   aPTT 31 24 - 36 seconds    Comment: Performed at [facility], [address], [place], [place] [address]  Brain natriuretic peptide     Status: Abnormal   Collection Time: 11/19/22  9:01 AM  Result Value Ref Range   B Natriuretic Peptide 191.9 (H) 0.0 - 100.0 pg/mL    Comment: Performed at [facility], [address], [place], [place] [address]  Troponin I (High Sensitivity)     Status: None   Collection Time: 11/19/22  9:01 AM  Result Value Ref Range   Troponin I (High Sensitivity) 4 <18 ng/L    Comment: (NOTE) Elevated high sensitivity troponin I (hsTnI) values and significant  changes across serial measurements may suggest ACS but many other  chronic and acute conditions are known to elevate hsTnI results.  Refer to the "Links" section for chest pain algorithms and additional  guidance. Performed at [facility], [address], [place], [place] [address]   SARS Coronavirus 2 by RT PCR (hospital order, performed in [facility] hospital lab) *cepheid single result test* Anterior Nasal Swab     Status: None   Collection Time: 11/19/22  9:01 AM   Specimen: Anterior Nasal Swab  Result Value Ref Range   SARS Coronavirus 2 by RT PCR NEGATIVE NEGATIVE    Comment: (NOTE) SARS-CoV-2 target nucleic acids are NOT DETECTED.  The SARS-CoV-2 RNA is generally detectable in upper and lower respiratory specimens during the acute phase of infection. The lowest concentration of SARS-CoV-2 viral copies this assay can detect is 250 copies / mL. A negative result does not preclude SARS-CoV-2 infection and should not be used as the sole basis for treatment or other patient management decisions.  A negative result may occur  with improper specimen collection / handling, submission of specimen other than nasopharyngeal swab, presence of viral mutation(s) within the areas targeted by this assay, and inadequate number of viral copies (<250 copies / mL). A negative result must be combined with clinical observations, patient history, and epidemiological information.  Fact Sheet for Patients:   RoadLapTop.co.za  Fact Sheet for Healthcare Providers: http://kim-miller.com/  This test is not yet approved or  cleared by the [place] FDA and has been authorized for detection and/or diagnosis of SARS-CoV-2 by FDA under an Emergency Use Authorization (EUA).  This EUA will remain in effect (meaning this test can be used) for the duration of the COVID-19 declaration under Section 564(b)(1) of the Act, 21 U.S.C. section 360bbb-3(b)(1), unless the authorization is terminated or revoked sooner.  Performed at [facility], [address] [place], [place] [address]   Blood gas, venous     Status: Abnormal   Collection Time: 11/19/22  9:01 AM  Result Value Ref Range   pH, Ven 7.17 (LL) 7.25 - 7.43    Comment: CRITICAL RESULT CALLED TO, READ BACK BY AND VERIFIED WITH: KISER,C. RN AT [phone] 11/19/22 MULLIN,T    pCO2, Ven 105 (HH) 44 - 60 mmHg    Comment: CRITICAL RESULT CALLED TO, READ BACK BY AND VERIFIED WITH: KISER,C. RN AT [phone] 11/19/22 MULLIN,T    pO2, Ven 82 (H) 32 - 45 mmHg   Bicarbonate 38.3 (H) 20.0 - 28.0 mmol/L   Acid-Base Excess 6.1 (H) 0.0 - 2.0 mmol/L  O2 Saturation 96.2 %   Patient temperature 37.0     Comment: Performed at [facility], [address], [place], [place] [address]  I-Stat Lactic Acid, ED     Status: None   Collection Time: 11/19/22  9:23 AM  Result Value Ref Range   Lactic Acid, Venous 0.5 0.5 - 1.9 mmol/L  Troponin I (High Sensitivity)     Status: None   Collection Time: 11/19/22 11:10 AM  Result  Value Ref Range   Troponin I (High Sensitivity) 5 <18 ng/L    Comment: (NOTE) Elevated high sensitivity troponin I (hsTnI) values and significant  changes across serial measurements may suggest ACS but many other  chronic and acute conditions are known to elevate hsTnI results.  Refer to the "Links" section for chest pain algorithms and additional  guidance. Performed at [facility], [address], [place], [place] [address]    DG Chest Port 1 View  Result Date: 11/19/2022 CLINICAL DATA:  Questionable sepsis.  Shortness of breath. EXAM: PORTABLE CHEST 1 VIEW COMPARISON:  02/03/2022; 11/25/2021; PET-CT-11/08/2022; chest CT-01/28/2022 FINDINGS: Unchanged cardiac silhouette and mediastinal contours with atherosclerotic plaque within the aortic arch. Potential developing airspace opacity within the peripheral aspect of the right mid lung. Known right lower lung pulmonary nodules not well demonstrated on this AP portable examination. Unchanged punctate left upper lung granuloma. Trace bilateral effusions are not excluded. No pneumothorax. No evidence of edema. No acute osseous abnormalities. IMPRESSION: 1. Potential developing airspace opacity within the peripheral aspect of the right mid lung, potentially atelectasis though worrisome for developing infection. 2. Known right lower lung pulmonary nodules not well demonstrated on this AP portable examination. Electronically Signed   By: Simonne Come M.D.   On: 11/19/2022 10:57    Pending Labs Unresulted Labs (From admission, onward)     Start     Ordered   11/20/22 0500  Brain natriuretic peptide  Tomorrow morning,   R        11/19/22 1414   11/20/22 0500  CBC with Differential/Platelet  Tomorrow morning,   R        11/19/22 1414   11/20/22 0500  Basic metabolic panel  Tomorrow morning,   R        11/19/22 1414   11/20/22 0500  Magnesium  Tomorrow morning,   R        11/19/22 1414   11/20/22 0500  Phosphorus  Tomorrow  morning,   R        11/19/22 1414   11/19/22 1415  Procalcitonin  Daily,   R     References:    Procalcitonin Lower Respiratory Tract Infection AND Sepsis Procalcitonin Algorithm   11/19/22 1414   11/19/22 1413  Strep pneumoniae urinary antigen (not at [facility])  Once,   R        11/19/22 1414   11/19/22 1413  Legionella Pneumophila Serogp 1 Ur Ag  Once,   R        11/19/22 1414   11/19/22 1412  Lactic acid, plasma  (Lactic Acid)  STAT Now then every 3 hours,   R (with STAT occurrences)      11/19/22 1414   11/19/22 1412  Culture, blood (Routine X 2) w Reflex to ID Panel  BLOOD CULTURE X 2,   R (with TIMED occurrences)      11/19/22 1414   11/19/22 1409  CBC  (heparin)  Once,   R  Comments: Baseline for heparin therapy IF NOT ALREADY DRAWN.  Notify MD if PLT < 100 K.    11/19/22 1414   11/19/22 1409  Creatinine, serum  (heparin)  Once,   R       Comments: Baseline for heparin therapy IF NOT ALREADY DRAWN.    11/19/22 1414   11/19/22 1017  MRSA Next Gen by PCR, Nasal  (MRSA Screening)  Once,   URGENT        11/19/22 1017   11/19/22 0855  Blood Culture (routine x 2)  (Undifferentiated presentation (screening labs and basic nursing orders))  BLOOD CULTURE X 2,   STAT      11/19/22 0855   11/19/22 0855  Urinalysis, w/ Reflex to Culture (Infection Suspected) -Urine, Clean Catch  (Undifferentiated presentation (screening labs and basic nursing orders))  ONCE - URGENT,   URGENT       Question:  Specimen Source  Answer:  Urine, Clean Catch   11/19/22 0855            Vitals/Pain Today's Vitals   11/19/22 1106 11/19/22 1115 11/19/22 1254 11/19/22 1330  BP:  115/77  (!) 141/89  Pulse:  91  89  Resp:  16  20  Temp:   99.4 F (37.4 C)   TempSrc:   Axillary   SpO2:  96%  96%  Weight: 108.9 kg     Height: 5\' 4"  (1.626 m)     PainSc:        Isolation Precautions No active isolations  Medications Medications  acetaminophen (TYLENOL) tablet 325-650 mg (has no administration in  time range)  aspirin EC tablet 81 mg (has no administration in time range)  atorvastatin (LIPITOR) tablet 20 mg (has no administration in time range)  escitalopram (LEXAPRO) tablet 20 mg (has no administration in time range)  pantoprazole (PROTONIX) EC tablet 40 mg (has no administration in time range)  ferrous sulfate EC tablet 324 mg (has no administration in time range)  cyanocobalamin (VITAMIN B12) tablet 500 mcg (has no administration in time range)  levETIRAcetam (KEPPRA) tablet 750 mg (has no administration in time range)  gabapentin (NEURONTIN) capsule 300 mg (has no administration in time range)  Vitamin D3 2,000 Units (has no administration in time range)  fluticasone (FLONASE) 50 MCG/ACT nasal spray 2 spray (has no administration in time range)  guaiFENesin (MUCINEX) 12 hr tablet 1,200 mg (has no administration in time range)  revefenacin (YUPELRI) nebulizer solution 175 mcg (has no administration in time range)  arformoterol (BROVANA) nebulizer solution 15 mcg (has no administration in time range)  budesonide (PULMICORT) nebulizer solution 0.5 mg (has no administration in time range)  docusate sodium (COLACE) capsule 100 mg (has no administration in time range)  polyethylene glycol (MIRALAX / GLYCOLAX) packet 17 g (has no administration in time range)  heparin injection 5,000 Units (has no administration in time range)  lactated ringers infusion (has no administration in time range)  azithromycin (ZITHROMAX) 500 mg in dextrose 5 % 250 mL IVPB (has no administration in time range)  ceFEPIme (MAXIPIME) 2 g in sodium chloride 0.9 % 100 mL IVPB (0 g Intravenous Stopped 11/19/22 1045)  azithromycin (ZITHROMAX) 500 mg in dextrose 5 % 250 mL IVPB (0 mg Intravenous Stopped 11/19/22 1116)  acetaminophen (TYLENOL) tablet 1,000 mg (1,000 mg Oral Given 11/19/22 1243)    Mobility walks

## 2022-11-19 NOTE — ED Notes (Signed)
Pt satting at 85. Respiratory notified.

## 2022-11-19 NOTE — Progress Notes (Signed)
eLink Physician-Brief Progress Note Patient Name: Beth [name] DOB: March 14, 1940 MRN: [id number]   Date of Service  11/19/2022  HPI/Events of Note  83 year old with COPD, chronic hypoxic respiratory failure on home oxygen, right lower lung mass presenting with acute on chronic respiratory failure secondary to pneumonia, COPD exacerbation.   Persistently hypertensive,holding her CCB and ACE-I  eICU Interventions  Add PRNS Hydralazine and Labetalol    0635 -refractory cough.  Change guaifenesin to guaifenesin with codeine.  Intervention Category Minor Interventions: Routine modifications to care plan (e.g. PRN medications for pain, fever)  Beth [name] 11/19/2022, 10:50 PM

## 2022-11-19 NOTE — Progress Notes (Signed)
Pharmacy Antibiotic Note  Beth [name] is a 82 y.o. female admitted on 11/19/2022 with pneumonia.  Pharmacy has been consulted for cefepime dosing.  Plan: Cefepime 2 gm IV q8h  Height: 5\' 4"  (162.6 cm) Weight: 108.9 kg (240 lb 1.3 oz) IBW/kg (Calculated) : 54.7  Temp (24hrs), Avg:98.8 F (37.1 C), Min:98.1 F (36.7 C), Max:99.4 F (37.4 C)  Recent Labs  Lab 11/19/22 0901 11/19/22 0923  WBC 25.9*  --   CREATININE 0.44  --   LATICACIDVEN  --  0.5    Estimated Creatinine Clearance: 66.5 mL/min (by C-G formula based on SCr of 0.44 mg/dL).    Allergies  Allergen Reactions   Prozac [Fluoxetine Hcl] Other (See Comments)    Seizures   Sulfa Antibiotics Nausea And Vomiting and Other (See Comments)    TINGLING IN LEGS ALSO   Sulfasalazine Other (See Comments)    TINGLING IN LEGS ALSO   Micardis [Telmisartan] Swelling and Other (See Comments)    Swelling, bruising    Prednisone Other (See Comments)    Mood changes    Allegra [Fexofenadine] Other (See Comments)    Makes her sleep 24 hours    Prozac [Fluoxetine] Other (See Comments)    Seizure   Augmentin [Amoxicillin-Pot Clavulanate] Rash   Avelox [Moxifloxacin Hcl In Nacl] Rash    LEVAQUIN IS OK-SEE NOTE   Lisinopril-Hydrochlorothiazide Other (See Comments)    unknown   Norvasc [Amlodipine Besylate] Other (See Comments)    Fatigue    Antimicrobials this admission: 11/6 azith x 1  11/6 cefepime >>  Dose adjustments this admission:  Microbiology results: 11/6 MRSA: 11/6 BCx2:  11/6 covid neg  Thank you for allowing pharmacy to be a part of this patient's care.  Herby Abraham, Pharm.D Use secure chat for questions 11/19/2022 2:41 PM

## 2022-11-19 NOTE — ED Triage Notes (Signed)
Pt BIBA from [place]. C/o SOB, rhonchi and wheezing noted by EMS. Given 10 mg albuterol,  .5 atrovent, and 25 solu-medrol.   GCS was 15 when EMS arrived, down to 12 when they got to ED.

## 2022-11-19 NOTE — Progress Notes (Signed)
A consult was received from an ED physician for cefepime per pharmacy dosing.  The patient's profile has been reviewed for ht/wt/allergies/indication/available labs.    A one time order has been placed for cefepime 2 gm IV x 1.    Further antibiotics/pharmacy consults should be ordered by admitting physician if indicated.                       Thank you,  Herby Abraham, Pharm.D Use secure chat for questions 11/19/2022 10:11 AM

## 2022-11-19 NOTE — Progress Notes (Signed)
Pt remains on BIPAP doing well at this time.

## 2022-11-19 NOTE — H&P (Signed)
NAME:  [name], MRN:  [id number], DOB:  Jul 28, 1940, LOS: 0 ADMISSION DATE:  11/19/2022, CONSULTATION DATE:  11/6 REFERRING MD:  lockwood, CHIEF COMPLAINT:  respiratory failure    History of Present Illness:  82 year old female w/ complicated medical hx as outlined below. Most notably chronic hypoxic resp failure on 4 lpm baseline, COPD, obesity and RLL lung mass.  Per patient started having increased cough and SOB about 2 weeks prior. Was treated w 5 d course of antibiotics and initially felt a little better but then over the last 5 days she has had progressive productive cough w/ thick yellow sputum, increased shortness of breath so EMS was called on 11/6. On arrival to ER sats in 70s on room air. Pt's RR was rapid and labored. She was diaphoretic and in acute distress.   CXR R>L airspace disease  Wbc 25.9, bnp 192, COVID neg, trop I neg, VBG: 7.17/Pco2 105 bicarb 38, negative lactate.   She was placed on NIPPV.  IV hydration and antibiotics initiated. 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 rate 16, height 5\' 4"  (1.626 m), weight 108.9 kg, last menstrual period 01/14/1995, SpO2 96%.    FiO2 (%):  [28 %-40 %] 40 %  No intake or output data in the 24 hours ending 11/19/22 1256 Filed Weights   11/19/22 1106  Weight: 108.9 kg    Examination: General: 82 year old female laying in bed. No distress w/ Current BIPAP mask HENT:  NCAT no JVD neck large Lungs: diffuse wheezing and bilateral rhonchi. No accessory use on NNIPPV Pcxr: right > left airspace disease  Cardiovascular: regular irreg AF w/ CVR on tele  Abdomen: soft not tender  Extremities: chronic venous stasis changes. Dependent edema. + pulses brisk CR Neuro: awake, oriented x 3 appropriate. Does trail off from time to time and forgets thoughts. No focal def noted  GU: due to void  Resolved Hospital Problem list     Assessment & Plan:  Acute on chronic hypoxic and hypercarbic respiratory failure 2/2 pneumonia, COPD exacerbation and possibly element of pulmonary edema -records says intolerant of pred but she has taken it several times in past. Appears like causes mood swings Plan Admit to ICU Cont NIPPV Cont pulse ox IV azithro and cefepime  Send resp viral studies Send urinary antigens Low dose IV steroids. 40mg /d x 5d Scheduled BDs Daily PCT and BNP If BP stable overnight can consider IV diuresis in am She is Full DNR but requests aggressive care up to point that it fails her   Sepsis 2/2 above. Nml lactic acid Plan Repeating lactic acid IVFs Abx as above  H/o AF w/ CVR, h/o HTN Has had fall in past. Off DOAC since jan 2024 Plan Cont tele Rate control  Holding her CCB for now but likely resume in am  Hold ace-I   H/o lung  nodule BRCA2 positive Plan F/u with [facility] Poor candidate for any therapy   H/o seizure  disorder Plan Cont Keppra  HLD Plan Cont statin   Diabetes type II Plan SSI   Iron deficiency anemia Plan Cont iron replacement   H/o anxiety, depression and chronic paresthesia  Plan Cont her lexapro, cont neurontin, holding her PRN klonopin given hypercarbia    DNR STATUS Goals of care discussed with the patient and also w/ her daughter Toniann Fail who is HCPOA. Review of palliative care note by Dr Vassie Loll indeed confirms Beth [name] does not want life support or heroic measures. She does not currently have advanced  directives signed but her daughter in full support of Jeree agree that maintaining current acute care interventions but should she have cardiac arrest we would not intubate or provide CPR Plan Full dnr  Cont current medical rx Need to fill out MOST form prior to dc  Best Practice (right click and "Reselect all SmartList Selections" daily)   Diet/type: NPO w/ oral meds DVT prophylaxis: prophylactic heparin  GI prophylaxis: PPI Lines: N/A Foley:  N/A Code Status:  DNR Last date of multidisciplinary goals of care discussion [discussed w/ daughter wendy on admit]  Labs   CBC: Recent Labs  Lab 11/19/22 0901  WBC 25.9*  NEUTROABS 19.9*  HGB 12.3  HCT 39.3  MCV 106.5*  PLT 316    Basic Metabolic Panel: Recent Labs  Lab 11/19/22 0901  NA 137  K 4.7  CL 97*  CO2 31  GLUCOSE 161*  BUN 8  CREATININE 0.44  CALCIUM 8.8*   GFR: Estimated Creatinine Clearance: 66.5 mL/min (by C-G formula based on SCr of 0.44 mg/dL). Recent Labs  Lab 11/19/22 0901 11/19/22 0923  WBC 25.9*  --   LATICACIDVEN  --  0.5    Liver Function Tests: Recent Labs  Lab 11/19/22 0901  AST 23  ALT 22  ALKPHOS 66  BILITOT 0.5  PROT 8.1  ALBUMIN 3.4*   No results for input(s): "LIPASE", "AMYLASE" in the last 168 hours. No results for input(s): "AMMONIA" in the last 168 hours.  ABG    Component Value Date/Time   HCO3 38.3 (H) 11/19/2022 0901   TCO2 26 05/16/2019 1134   O2SAT 96.2 11/19/2022 0901     Coagulation Profile: Recent Labs  Lab 11/19/22 0901  INR 1.0    Cardiac Enzymes: No results for input(s): "CKTOTAL", "CKMB", "CKMBINDEX", "TROPONINI" in the last 168 hours.  HbA1C: Hgb A1c MFr Bld  Date/Time Value Ref Range Status  05/17/2019 03:46 AM 6.0 (H) 4.8 - 5.6 % Final    Comment:    (NOTE) Pre diabetes:          5.7%-6.4% Diabetes:              >6.4% Glycemic control for   <7.0% adults with diabetes     CBG: No results for input(s): "GLUCAP" in the last 168  hours.  Review of Systems:   Review of Systems  Constitutional:  Positive for diaphoresis and malaise/fatigue. Negative for fever.  HENT: Negative.    Eyes: Negative.   Respiratory:  Positive for cough, sputum production, shortness of breath and wheezing.   Cardiovascular:  Positive for leg swelling.  Gastrointestinal: Negative.   Genitourinary: Negative.   Musculoskeletal: Negative.   Endo/Heme/Allergies: Negative.      Past Medical History:  She,  has a past medical history of Afib (HCC), Arthritis, BRCA2 positive (2017), COPD (chronic obstructive pulmonary disease) (HCC), Diabetes mellitus without complication (HCC), Diverticulosis, Dysrhythmia, Hearing loss, HTN (hypertension), Memory loss, Pre-diabetes, Seasonal depression (HCC), Seizure  disorder, complex partial, 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HCC) (seizure free for one year), Seizures (HCC), Stroke [facility]), Stroke, hemorrhagic (HCC) (aneurysm bleed. ), and Vestibulitis of ear.   Surgical History:   Past Surgical History:  Procedure Laterality Date   broken ankle Right    IR THORACENTESIS ASP PLEURAL SPACE W/IMG GUIDE  06/18/2021   TONSILLECTOMY     TRANSCAROTID ARTERY REVASCULARIZATION  Right 06/20/2019   TRANSCAROTID ARTERY REVASCULARIZATION  Right 06/20/2019   Procedure: RIGHT TRANSCAROTID ARTERY REVASCULARIZATION;  Surgeon: Chuck Hint, MD;  Location: [facility] OR;  Service: Vascular;  Laterality: Right;     Social History:   reports that she quit smoking about 16 years ago. Her smoking use included cigarettes. She started smoking about 57 years ago. She has a 41 pack-year smoking history. She has never used smokeless tobacco. She reports that she does not drink alcohol and does not use drugs.   Family History:  Her family history includes Bleeding Disorder in her maternal grandfather; Breast cancer (age of onset: 24) in her daughter; Colon cancer in her maternal aunt; Dementia in her maternal grandfather and mother; Emphysema  in her maternal aunt, maternal aunt, and maternal uncle; High blood pressure in her maternal grandmother; Osteoporosis in her mother; Other in her daughter and daughter; Ovarian cancer (age of onset: 60) in her paternal grandmother; Ovarian cancer (age of onset: 42) in her paternal aunt; Stroke in her mother; Thyroid disease in her mother.   Allergies Allergies  Allergen Reactions   Prozac [Fluoxetine Hcl] Other (See Comments)    seizures   Sulfa Antibiotics Nausea And Vomiting and Other (See Comments)    TINGLING IN LEGS ALSO   Micardis [Telmisartan] Swelling and Other (See Comments)    Swelling, bruising Swelling, bruising   Prednisone Other (See Comments)    mood changes Mood changes   Allegra [Fexofenadine]     Makes her sleep 24 hours    Prozac [Fluoxetine]     seizure   Augmentin [Amoxicillin-Pot Clavulanate] Rash   Avelox [Moxifloxacin Hcl In Nacl] Rash    LEVAQUIN IS OK-SEE NOTE   Lisinopril-Hydrochlorothiazide Other (See Comments)    unknown   Norvasc [Amlodipine Besylate] Other (See Comments)    fatigue     Home Medications  Prior to Admission medications   Medication Sig Start Date End Date Taking? Authorizing Provider  acetaminophen (TYLENOL) 325 MG tablet Take 1-2 tablets (325-650 mg total) by mouth every 4 (four) hours as needed for mild pain. 06/03/19   Love, Evlyn Kanner, PA-C  Albuterol Sulfate (PROAIR RESPICLICK) 108 (90 Base) MCG/ACT AEPB Inhale 2 puffs into the lungs every 6 (six) hours. 10/16/22   Oretha Milch, MD  aspirin EC 81 MG tablet Take 81 mg by mouth daily.    [provider]  atorvastatin (LIPITOR) 20 MG tablet TAKE 1 TABLET DAILY 02/10/22   Maeola Harman, MD  azelastine (ASTELIN) 0.1 % nasal spray Place 1 spray into both nostrils 2 (two) times daily. Use in each nostril as directed 08/20/22   Oretha Milch, MD  captopril (CAPOTEN) 50 MG tablet Take 50 mg by mouth 2 (two) times daily.    [provider]  Cholecalciferol  (VITAMIN D3) 2000 units capsule Take 2,000 Units by mouth daily.     [provider]  clonazePAM (KLONOPIN) 0.5 MG tablet TAKE 1 TABLET BY MOUTH EVERY NIGHT AT BEDTIME AS NEEDED FOR ANXIETY AND INSOMNIA 07/28/22   Penumalli, Glenford Bayley, MD  diltiazem (CARDIZEM CD) 360  MG 24 hr capsule TAKE 1 CAPSULE DAILY 02/03/22   Corky Crafts, MD  ferrous sulfate 324 MG TBEC Take 324 mg by mouth daily with breakfast.    [provider]  fluticasone (FLONASE) 50 MCG/ACT nasal spray Place 1 spray into the nose daily as needed for allergies. Spray twice daily as needed    [provider]  fluticasone-salmeterol (ADVAIR HFA) 230-21 MCG/ACT inhaler Inhale 2 puffs into the lungs 2 (two) times daily. 10/10/22   Oretha Milch, MD  gabapentin (NEURONTIN) 600 MG tablet TAKE ONE-HALF (1/2) TABLET AT BEDTIME 05/28/22   Dohmeier, Porfirio Mylar, MD  guaiFENesin (MUCINEX) 600 MG 12 hr tablet Take 1,200 mg by mouth 2 (two) times daily.    [provider]  levETIRAcetam (KEPPRA) 750 MG tablet Take 1 tablet (750 mg total) by mouth 2 (two) times daily. 05/28/22   Dohmeier, Porfirio Mylar, MD  LEXAPRO 20 MG tablet Take 1 tablet (20 mg total) by mouth daily. Morning dosage 04/21/13   Dohmeier, Porfirio Mylar, MD  metoprolol tartrate (LOPRESSOR) 25 MG tablet Take 25 mg by mouth 2 (two) times daily as needed. 05/02/20   [provider]  MICONAZOLE NITRATE EX Apply topically.    [provider]  nystatin cream (MYCOSTATIN) Apply 1 Application topically 2 (two) times daily. 09/27/21   [provider]  pantoprazole (PROTONIX) 40 MG tablet Take 40 mg by mouth 2 (two) times daily. 11/05/21   [provider]  potassium chloride (KLOR-CON M) 10 MEQ tablet Take 1 tablet (10 mEq total) by mouth daily for 5 days. 11/28/21 12/03/21  Cobb, Ruby Cola, NP  sodium chloride HYPERTONIC 3 % nebulizer solution Take by nebulization in the morning and at bedtime. 03/08/21   Nyoka Cowden, MD   Spacer/Aero-Holding Chambers (AEROCHAMBER PLUS WITH MASK) inhaler As needed with inhaler 12/27/19   Oretha Milch, MD  vitamin B-12 (CYANOCOBALAMIN) 500 MCG tablet Take 1 tablet (500 mcg total) by mouth daily. 06/04/19   Love, Evlyn Kanner, PA-C  YUPELRI 175 MCG/3ML nebulizer solution INHALE CONTENTS OF 1 VIAL (3 ML, 175 MCG) VIA NEBULIZER DAILY 10/09/22   Allison Quarry, Ruby Cola, NP     Critical care time: 45 minutes      Simonne Martinet ACNP-BC [facility] Pager # [phone] OR # [phone] if no answer

## 2022-11-19 NOTE — ED Notes (Signed)
Took patient off BiPAP for tylenol administration and MRSA swab. Patient's oxygen went down to 70%. Placed back on BiPAP after meds and swab. Patient back up to 95%.

## 2022-11-19 NOTE — Progress Notes (Signed)
This RN removed BiPAP and placed patient on 5LNC to allow for med administration and patient stated that her nose was hurting from BiPAP. This RN also educated patient that if she desats or becomes confused she will be placed back on BiPAP, pt agreed.

## 2022-11-19 NOTE — Plan of Care (Signed)
  Problem: Clinical Measurements: Goal: Ability to maintain clinical measurements within normal limits will improve Outcome: Progressing Goal: Will remain free from infection Outcome: Progressing   Problem: Activity: Goal: Risk for activity intolerance will decrease Outcome: Progressing   Problem: Elimination: Goal: Will not experience complications related to urinary retention Outcome: Progressing   Problem: Safety: Goal: Ability to remain free from injury will improve Outcome: Progressing   Problem: Skin Integrity: Goal: Risk for impaired skin integrity will decrease Outcome: Progressing

## 2022-11-19 NOTE — Progress Notes (Signed)
Elink following for sepsis protocol. 

## 2022-11-19 NOTE — ED Notes (Signed)
Patient's Fi02 up to 50%.

## 2022-11-19 NOTE — Progress Notes (Signed)
Pt placed on BIPAP in ED due to AMS.

## 2022-11-20 ENCOUNTER — Telehealth: Payer: Self-pay | Admitting: Acute Care

## 2022-11-20 ENCOUNTER — Inpatient Hospital Stay (HOSPITAL_COMMUNITY): Payer: Medicare Other

## 2022-11-20 DIAGNOSIS — R4589 Other symptoms and signs involving emotional state: Secondary | ICD-10-CM

## 2022-11-20 DIAGNOSIS — Z7189 Other specified counseling: Secondary | ICD-10-CM

## 2022-11-20 DIAGNOSIS — J189 Pneumonia, unspecified organism: Secondary | ICD-10-CM | POA: Diagnosis not present

## 2022-11-20 DIAGNOSIS — R918 Other nonspecific abnormal finding of lung field: Secondary | ICD-10-CM

## 2022-11-20 DIAGNOSIS — Z66 Do not resuscitate: Secondary | ICD-10-CM

## 2022-11-20 DIAGNOSIS — R0602 Shortness of breath: Secondary | ICD-10-CM

## 2022-11-20 DIAGNOSIS — J441 Chronic obstructive pulmonary disease with (acute) exacerbation: Secondary | ICD-10-CM | POA: Diagnosis not present

## 2022-11-20 DIAGNOSIS — Z515 Encounter for palliative care: Secondary | ICD-10-CM

## 2022-11-20 DIAGNOSIS — J9621 Acute and chronic respiratory failure with hypoxia: Secondary | ICD-10-CM | POA: Diagnosis not present

## 2022-11-20 DIAGNOSIS — J9622 Acute and chronic respiratory failure with hypercapnia: Secondary | ICD-10-CM | POA: Diagnosis not present

## 2022-11-20 LAB — PHOSPHORUS: Phosphorus: 2.5 mg/dL (ref 2.5–4.6)

## 2022-11-20 LAB — CBC WITH DIFFERENTIAL/PLATELET
Abs Immature Granulocytes: 0.16 10*3/uL — ABNORMAL HIGH (ref 0.00–0.07)
Basophils Absolute: 0 10*3/uL (ref 0.0–0.1)
Basophils Relative: 0 %
Eosinophils Absolute: 0 10*3/uL (ref 0.0–0.5)
Eosinophils Relative: 0 %
HCT: 33.5 % — ABNORMAL LOW (ref 36.0–46.0)
Hemoglobin: 10.7 g/dL — ABNORMAL LOW (ref 12.0–15.0)
Immature Granulocytes: 1 %
Lymphocytes Relative: 4 %
Lymphs Abs: 0.7 10*3/uL (ref 0.7–4.0)
MCH: 32.9 pg (ref 26.0–34.0)
MCHC: 31.9 g/dL (ref 30.0–36.0)
MCV: 103.1 fL — ABNORMAL HIGH (ref 80.0–100.0)
Monocytes Absolute: 1 10*3/uL (ref 0.1–1.0)
Monocytes Relative: 5 %
Neutro Abs: 16.9 10*3/uL — ABNORMAL HIGH (ref 1.7–7.7)
Neutrophils Relative %: 90 %
Platelets: 259 10*3/uL (ref 150–400)
RBC: 3.25 MIL/uL — ABNORMAL LOW (ref 3.87–5.11)
RDW: 12.9 % (ref 11.5–15.5)
WBC: 18.9 10*3/uL — ABNORMAL HIGH (ref 4.0–10.5)
nRBC: 0 % (ref 0.0–0.2)

## 2022-11-20 LAB — GLUCOSE, CAPILLARY
Glucose-Capillary: 134 mg/dL — ABNORMAL HIGH (ref 70–99)
Glucose-Capillary: 152 mg/dL — ABNORMAL HIGH (ref 70–99)
Glucose-Capillary: 156 mg/dL — ABNORMAL HIGH (ref 70–99)
Glucose-Capillary: 158 mg/dL — ABNORMAL HIGH (ref 70–99)
Glucose-Capillary: 176 mg/dL — ABNORMAL HIGH (ref 70–99)
Glucose-Capillary: 197 mg/dL — ABNORMAL HIGH (ref 70–99)
Glucose-Capillary: 202 mg/dL — ABNORMAL HIGH (ref 70–99)

## 2022-11-20 LAB — BASIC METABOLIC PANEL
Anion gap: 10 (ref 5–15)
BUN: 15 mg/dL (ref 8–23)
CO2: 30 mmol/L (ref 22–32)
Calcium: 8.6 mg/dL — ABNORMAL LOW (ref 8.9–10.3)
Chloride: 96 mmol/L — ABNORMAL LOW (ref 98–111)
Creatinine, Ser: 0.66 mg/dL (ref 0.44–1.00)
GFR, Estimated: >60 mL/min (ref >60)
Glucose, Bld: 164 mg/dL — ABNORMAL HIGH (ref 70–99)
Potassium: 4.2 mmol/L (ref 3.5–5.1)
Sodium: 136 mmol/L (ref 135–145)

## 2022-11-20 LAB — MAGNESIUM: Magnesium: 1.9 mg/dL (ref 1.7–2.4)

## 2022-11-20 LAB — BRAIN NATRIURETIC PEPTIDE: B Natriuretic Peptide: 167.2 pg/mL — ABNORMAL HIGH (ref 0.0–100.0)

## 2022-11-20 MED ORDER — GUAIFENESIN-CODEINE 100-10 MG/5ML PO SOLN
5.0000 mL | Freq: Four times a day (QID) | ORAL | Status: DC | PRN
  Administered 2022-11-21: 5 mL via ORAL
  Filled 2022-11-20 (×2): qty 5

## 2022-11-20 MED ORDER — SODIUM CHLORIDE 0.9% FLUSH
3.0000 mL | Freq: Two times a day (BID) | INTRAVENOUS | Status: DC
  Administered 2022-11-20 – 2022-11-23 (×7): 3 mL via INTRAVENOUS

## 2022-11-20 MED ORDER — INSULIN ASPART 100 UNIT/ML IJ SOLN: 0.0000 [IU] | Freq: Three times a day (TID) | INTRAMUSCULAR | Status: DC

## 2022-11-20 MED ORDER — INSULIN ASPART 100 UNIT/ML IJ SOLN
0.0000 [IU] | Freq: Every day | INTRAMUSCULAR | Status: DC
  Administered 2022-11-22: 2 [IU] via SUBCUTANEOUS

## 2022-11-20 MED ORDER — PERFLUTREN LIPID MICROSPHERE
1.0000 mL | INTRAVENOUS | Status: AC | PRN
Start: 2022-11-20 — End: 2022-11-20
  Administered 2022-11-20: 2 mL via INTRAVENOUS

## 2022-11-20 MED ORDER — CLONAZEPAM 1 MG PO TABS
1.0000 mg | ORAL_TABLET | Freq: Every day | ORAL | Status: DC
  Administered 2022-11-20 – 2022-11-24 (×5): 1 mg via ORAL
  Filled 2022-11-20 (×5): qty 1

## 2022-11-20 MED ORDER — DEXTROSE 5 % IV SOLN
500.0000 mg | INTRAVENOUS | Status: DC
  Administered 2022-11-21 – 2022-11-23 (×3): 500 mg via INTRAVENOUS
  Filled 2022-11-20 (×3): qty 5

## 2022-11-20 MED ORDER — CAPTOPRIL 25 MG PO TABS
50.0000 mg | ORAL_TABLET | Freq: Two times a day (BID) | ORAL | Status: DC
  Administered 2022-11-20 – 2022-11-25 (×11): 50 mg via ORAL
  Filled 2022-11-20 (×11): qty 2

## 2022-11-20 MED ORDER — INSULIN ASPART 100 UNIT/ML IJ SOLN
0.0000 [IU] | Freq: Three times a day (TID) | INTRAMUSCULAR | Status: DC
Start: 2022-11-20 — End: 2022-11-25
  Administered 2022-11-20 – 2022-11-21 (×3): 2 [IU] via SUBCUTANEOUS
  Administered 2022-11-21 – 2022-11-22 (×2): 1 [IU] via SUBCUTANEOUS
  Administered 2022-11-22: 2 [IU] via SUBCUTANEOUS
  Administered 2022-11-23: 3 [IU] via SUBCUTANEOUS
  Administered 2022-11-23 – 2022-11-24 (×2): 1 [IU] via SUBCUTANEOUS
  Administered 2022-11-25: 2 [IU] via SUBCUTANEOUS

## 2022-11-20 MED ORDER — FUROSEMIDE 10 MG/ML IJ SOLN
40.0000 mg | Freq: Once | INTRAMUSCULAR | Status: AC
  Administered 2022-11-20: 40 mg via INTRAVENOUS
  Filled 2022-11-20: qty 4

## 2022-11-20 MED ORDER — PREDNISONE 20 MG PO TABS
30.0000 mg | ORAL_TABLET | Freq: Every day | ORAL | Status: AC
  Administered 2022-11-21 – 2022-11-23 (×3): 30 mg via ORAL
  Filled 2022-11-20 (×3): qty 1

## 2022-11-20 MED ORDER — DILTIAZEM HCL ER COATED BEADS 180 MG PO CP24
360.0000 mg | ORAL_CAPSULE | Freq: Every day | ORAL | Status: DC
  Administered 2022-11-20 – 2022-11-25 (×6): 360 mg via ORAL
  Filled 2022-11-20 (×6): qty 2

## 2022-11-20 NOTE — Consult Note (Signed)
Consultation Note Date: 11/20/2022   Patient Name: Beth [name]  DOB: 04-Dec-1940  MRN: [id number]  Age / Sex: 82 y.o., female   PCP: System, Provider Not In Referring Physician: Dorcas Carrow, MD  Reason for Consultation: Establishing goals of care     Chief Complaint/History of Present Illness:   Patient is an 82 year old female with a past medical history of chronic hypoxic respiratory failure on 4 L oxygen, COPD, obesity, and right lower lobe lung mass who was admitted on 11/19/2018 for management of worsening cough and shortness of breath despite outpatient treatment with antibiotics.  Patient initially required BiPAP support and admission to the ICU.  Patient currently receiving medical management for acute on chronic hypoxic and hypercarbic respiratory failure secondary to pneumonia and COPD exacerbation.  PCCM consulted.  Palliative medicine team consulted to assist with complex medical decision making.  Extensive review of EMR prior to presenting to bedside.  Also discussed care with RN for medical updates.  PCCM provider had already reviewed overall goals of medical care with patient.  Patient and her daughter, Toniann Fail, who is reported to be HCPOA however agreeing that patient is DNR/DNI.  Plan to continue current appropriate medical therapies.  Presented to bedside the resident Dr. Sherrilee Gilles to meet with patient.  Patient's daughter present.  Returned later in day as patient needed to use the restroom so excused ourselves. Returned later on to bedside to meet with patient.  Spent time getting to know about patient's current medical illness.  Spent time learning about patient's 65-year marriage.  Patient enjoys time at her ALF with her husband doing crafts and interacting with others. Patient hopeful for continued improvement at this time.  Patient has heard of palliative medicine services and has palliative medicine involvement previously.  Patient and family again confirmed CODE STATUS is  DNR/DNI.  Will place signed DNR form on patient's chart to return to ALF with her. Also reviewed some of patient's medications at home.  Patient taking gabapentin and clonazepam daily.  Noted would inform hospitalist about clonazepam dose. All questions answered at that time.  Spent time providing emotional support via active listening.  Thanked patient for meeting with our team.  Noted palliative medicine team continue to follow along with patient's medical journey.  Primary Diagnoses  Present on Admission:  Acute on chronic respiratory failure [facility])   Past Medical History:  Diagnosis Date   Afib (HCC)    diagnosed 05/17/19   Arthritis    BRCA2 positive 2017   COPD (chronic obstructive pulmonary disease) (HCC)    Diabetes mellitus without complication (HCC)    Diverticulosis    Dysrhythmia    Afib   Hearing loss    HTN (hypertension)    Memory loss    Pre-diabetes    Seasonal depression (HCC)    Seizure disorder, complex partial, 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HCC) seizure free for one year   Seizures [facility])    anniversary seizure - 1 year after serizure   Stroke (HCC)    05/2019   Stroke, hemorrhagic (HCC) aneurysm bleed.    Vestibulitis of ear    Social History   Socioeconomic History   Marital status: Married    Spouse name: [name]   Number of children: 2   Years of education: 12   Highest education level: Not on file  Occupational History   Not on file  Tobacco Use   Smoking status: Former    Current packs/day: 0.00    Average packs/day: 1 pack/day  for 41.0 years (41.0 ttl pk-yrs)    Types: Cigarettes    Start date: 01/13/1965    Quit date: 01/13/2006    Years since quitting: 16.8   Smokeless tobacco: Never  Vaping Use   Vaping status: Never Used  Substance and Sexual Activity   Alcohol use: No    Alcohol/week: 0.0 standard drinks of alcohol   Drug use: No   Sexual activity: Never    Partners: Male    Birth control/protection: Post-menopausal  Other Topics Concern    Not on file  Social History Narrative   Patient is married ([name]) and lives at home with her husband.Patient is retired.Patient has two children.Patient has a high school education.Patient is right-handed.Patient drinks 1 big mug of coffee daily and sometimes tea in the evenings.   Social Determinants of Health   Financial Resource Strain: Not on file  Food Insecurity: No Food Insecurity (11/19/2022)   Hunger Vital Sign    Worried About Running Out of Food in the Last Year: Never true    Ran Out of Food in the Last Year: Never true  Transportation Needs: No Transportation Needs (11/19/2022)   PRAPARE - Administrator, Civil Service (Medical): No    Lack of Transportation (Non-Medical): No  Physical Activity: Not on file  Stress: Not on file  Social Connections: Not on file   Family History  Problem Relation Age of Onset   Dementia Mother    Stroke Mother    Thyroid disease Mother    Osteoporosis Mother    Ovarian cancer Paternal Grandmother 87   Ovarian cancer Paternal Aunt 12   Breast cancer Daughter 25       breast ca x2, bilateral dx. 42, 46--estrogen   Other Daughter        no genetic testing as of 07/2015; has had ovaries removed   Other Daughter        BRCA 2 Pos.   Colon cancer Maternal Aunt        dx. unspecified age   Emphysema Maternal Uncle    High blood pressure Maternal Grandmother    Dementia Maternal Grandfather    Bleeding Disorder Maternal Grandfather    Emphysema Maternal Aunt    Emphysema Maternal Aunt    Scheduled Meds:  arformoterol  15 mcg Nebulization BID   aspirin EC  81 mg Oral Daily   atorvastatin  20 mg Oral Daily   budesonide (PULMICORT) nebulizer solution  0.5 mg Nebulization BID   captopril  50 mg Oral BID   Chlorhexidine Gluconate Cloth  6 each Topical Q0600   cholecalciferol  2,000 Units Oral Daily   cyanocobalamin  500 mcg Oral Daily   diltiazem  360 mg Oral Daily   escitalopram  20 mg Oral Daily   ferrous sulfate  324  mg Oral Q breakfast   fluticasone  2 spray Each Nare Daily   gabapentin  300 mg Oral QHS   heparin  5,000 Units Subcutaneous Q8H   levETIRAcetam  750 mg Oral BID   pantoprazole  40 mg Oral BID   [START ON 11/21/2022] predniSONE  30 mg Oral Q breakfast   revefenacin  175 mcg Nebulization Daily   sodium chloride flush  3 mL Intravenous Q12H   Continuous Infusions:  azithromycin Stopped (11/20/22 0026)   ceFEPime (MAXIPIME) IV Stopped (11/20/22 1057)   PRN Meds:.acetaminophen, docusate sodium, guaiFENesin-codeine, hydrALAZINE, labetalol, polyethylene glycol Allergies  Allergen Reactions   Prozac [Fluoxetine Hcl] Other (See Comments)  Seizures   Sulfa Antibiotics Nausea And Vomiting and Other (See Comments)    TINGLING IN LEGS ALSO   Micardis [Telmisartan] Swelling and Other (See Comments)    Swelling, bruising    Prednisone Other (See Comments)    Mood changes    Allegra [Fexofenadine] Other (See Comments)    Makes her sleep 24 hours    Augmentin [Amoxicillin-Pot Clavulanate] Rash   Avelox [Moxifloxacin Hcl In Nacl] Rash    LEVAQUIN IS OK-SEE NOTE   Lisinopril-Hydrochlorothiazide Other (See Comments)    Unknown reaction   Norvasc [Amlodipine Besylate] Other (See Comments)    Fatigue   CBC:    Component Value Date/Time   WBC 18.9 (H) 11/20/2022 0631   HGB 10.7 (L) 11/20/2022 0631   HGB 13.5 08/02/2020 1221   HCT 33.5 (L) 11/20/2022 0631   HCT 41.4 08/02/2020 1221   PLT 259 11/20/2022 0631   PLT 317 08/02/2020 1221   MCV 103.1 (H) 11/20/2022 0631   MCV 96 08/02/2020 1221   NEUTROABS 16.9 (H) 11/20/2022 0631   LYMPHSABS 0.7 11/20/2022 0631   MONOABS 1.0 11/20/2022 0631   EOSABS 0.0 11/20/2022 0631   BASOSABS 0.0 11/20/2022 0631   Comprehensive Metabolic Panel:    Component Value Date/Time   NA 136 11/20/2022 0315   NA 146 (H) 08/02/2020 1221   K 4.2 11/20/2022 0315   CL 96 (L) 11/20/2022 0315   CO2 30 11/20/2022 0315   BUN 15 11/20/2022 0315   BUN 13  08/02/2020 1221   CREATININE 0.66 11/20/2022 0315   CREATININE 0.85 02/22/2018 1051   GLUCOSE 164 (H) 11/20/2022 0315   CALCIUM 8.6 (L) 11/20/2022 0315   AST 23 11/19/2022 0901   AST 18 02/22/2018 1051   ALT 22 11/19/2022 0901   ALT 16 02/22/2018 1051   ALKPHOS 66 11/19/2022 0901   BILITOT 0.5 11/19/2022 0901   BILITOT 0.6 02/22/2018 1051   PROT 8.1 11/19/2022 0901   ALBUMIN 3.4 (L) 11/19/2022 0901    Physical Exam: Vital Signs: BP (!) 161/109   Pulse 89   Temp 98.6 F (37 C) (Oral)   Resp 19   Ht 5\' 4"  (1.626 m)   Wt 112.7 kg   LMP 01/14/1995 (Approximate)   SpO2 95%   BMI 42.65 kg/m  SpO2: SpO2: 95 % O2 Device: O2 Device: Nasal Cannula O2 Flow Rate: O2 Flow Rate (L/min): 5 L/min Intake/output summary:  Intake/Output Summary (Last 24 hours) at 11/20/2022 1100 Last data filed at 11/20/2022 0944 Gross per 24 hour  Intake 1404.42 ml  Output 1225 ml  Net 179.42 ml   LBM: Last BM Date :  (PTA) Baseline Weight: Weight: 108.9 kg Most recent weight: Weight: 112.7 kg  General: NAD, alert, sitting in bedside chair, pleasant HENT: moist mucous membranes Cardiovascular: Tachycardia noted Respiratory: Slightly increased work of breathing noted, not in respiratory distress, on nasal cannula O2 Neuro: A&Ox4, following commands easily Psych: appropriately answers all questions          Palliative Performance Scale: 50%              Additional Data Reviewed: Recent Labs    11/19/22 0901 11/19/22 1714 11/19/22 1909 11/20/22 0315 11/20/22 0631  WBC 25.9*  --  20.3*  --  18.9*  HGB 12.3  --  10.9*  --  10.7*  PLT 316  --  259  --  259  NA 137  --   --  136  --   BUN 8  --   --  15  --   CREATININE 0.44 0.62  --  0.66  --     Imaging: DG Chest Port 1 View CLINICAL DATA:  Questionable sepsis.  Shortness of breath.  EXAM: PORTABLE CHEST 1 VIEW  COMPARISON:  02/03/2022; 11/25/2021; PET-CT-11/08/2022; chest CT-01/28/2022  FINDINGS: Unchanged cardiac silhouette  and mediastinal contours with atherosclerotic plaque within the aortic arch. Potential developing airspace opacity within the peripheral aspect of the right mid lung. Known right lower lung pulmonary nodules not well demonstrated on this AP portable examination. Unchanged punctate left upper lung granuloma. Trace bilateral effusions are not excluded. No pneumothorax. No evidence of edema. No acute osseous abnormalities.  IMPRESSION: 1. Potential developing airspace opacity within the peripheral aspect of the right mid lung, potentially atelectasis though worrisome for developing infection. 2. Known right lower lung pulmonary nodules not well demonstrated on this AP portable examination.  Electronically Signed   By: Simonne Come M.D.   On: 11/19/2022 10:57    I personally reviewed recent imaging.   Palliative Care Assessment and Plan Summary of Established Goals of Care and Medical Treatment Preferences   Patient is an 82 year old female with a past medical history of chronic hypoxic respiratory failure on 4 L oxygen, COPD, obesity, and right lower lobe lung mass who was admitted on 11/19/2018 for management of worsening cough and shortness of breath despite outpatient treatment with antibiotics.  Patient initially required BiPAP support and admission to the ICU.  Patient currently receiving medical management for acute on chronic hypoxic and hypercarbic respiratory failure secondary to pneumonia and COPD exacerbation.  PCCM consulted.  Palliative medicine team consulted to assist with complex medical decision making.  # Complex medical decision making/goals of care  -Discussed care with patient and family as detailed above in HPI.  Patient able to discuss her chronic medical illnesses.  Patient hopes for continued improvement at this time.  Patient again confirmed DNR/DNI status.  Continue appropriate medical interventions.  Patient hopes to be able to return to her ALF.  -  Code Status:  Limited: Do not attempt resuscitation (DNR) -DNR-LIMITED -Do Not Intubate/DNI     -Signed DNR form placed on patient's chart.   # Psycho-social/Spiritual Support:  - Support System: Daughter-Wendy/HCPOA and daughter [place]  # Discharge Planning: Patient is a long-term care resident in assisted living at [facility].  Patient was receiving home health services and PT/OT at facility.  Likely to return to facility.  Thank you for allowing the palliative care team to participate in the care Elissa Hefty.  Alvester Morin, DO Palliative Care Provider PMT # [phone]  If patient remains symptomatic despite maximum doses, please call PMT at [phone] between 0700 and 1900. Outside of these hours, please call attending, as PMT does not have night coverage.  Personally spent 62 minutes in patient care including extensive chart review (labs, imaging, progress/consult notes, vital signs), medically appropraite exam, discussed with treatment team, education to patient, family, and staff, documenting clinical information, medication review and management, coordination of care, and available advanced directive documents.   *Please note that this is a verbal dictation therefore any spelling or grammatical errors are due to the "[employer]agon [employer] One" system interpretation.

## 2022-11-20 NOTE — Progress Notes (Signed)
NAME:  Beth [name], MRN:  [id number], DOB:  1941/01/01, LOS: 1 ADMISSION DATE:  11/19/2022, CONSULTATION DATE:  11/6 REFERRING MD:  lockwood, CHIEF COMPLAINT:  respiratory failure    History of Present Illness:  82 year old female w/ complicated medical hx as outlined below. Most notably chronic hypoxic resp failure on 4 lpm baseline, COPD, obesity and RLL lung mass.  Per patient started having increased cough and SOB about 2 weeks prior. Was treated w 5 d course of antibiotics and initially felt a little better but then over the last 5 days she has had progressive productive cough w/ thick yellow sputum, increased shortness of breath so EMS was called on 11/6. On arrival to ER sats in 70s on room air. Pt's RR was rapid and labored. She was diaphoretic and in acute distress.   CXR R>L airspace disease  Wbc 25.9, bnp 192, COVID neg, trop I neg, VBG: 7.17/Pco2 105 bicarb 38, negative lactate.   She was placed on NIPPV.  IV hydration and antibiotics initiated. 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 Hypercarbic. Placed on NIPPV. Steroids, bd and abx. DNR/DNI confirmed 11/7 better. Off BIPAP O2 needs improved getting lasix x 1. BIPAP changed to PRN. Added back ace I and CCB getting OOB. PCCM s/o effectively    Interim History / Subjective:  Feel breathing better  Objective   Blood pressure (!) 168/79, pulse 89, temperature 98.6 F (37 C), temperature source Oral, resp. rate 19, height 5\' 4"  (1.626 m), weight 112.7 kg, last  menstrual period 01/14/1995, SpO2 95%.    FiO2 (%):  [35 %-40 %] 35 %   Intake/Output Summary (Last 24 hours) at 11/20/2022 1007 Last data filed at 11/20/2022 0944 Gross per 24 hour  Intake 1404.42 ml  Output 1225 ml  Net 179.42 ml   Filed Weights   11/19/22 1106 11/19/22 1539 11/20/22 0500  Weight: 108.9 kg 109.7 kg 112.7 kg    Examination: General 82 year old female resting in bed. Has multiple complaints but primarily d/t BP cuff and also desire to get OOB HENT NCAT no JVD + upper airway wheeze Pulm continues to have diffuse wheeze and bibasilar rales. O2 needs improved now on 5lpm w/ room to further titrate Card irreg irreg af on tele  Abd soft Ext warm scattered areas of ecchymosis dependent edema  Neuro awake. Oriented x 3 but trails off on tangents and at times has word finding issues. No focal motor def   Resolved Hospital Problem list     Assessment & Plan:  Acute on chronic hypoxic and hypercarbic respiratory failure 2/2 pneumonia, COPD exacerbation and possibly element of pulmonary edema -records says intolerant of pred but she has taken it several times in past. Appears like causes mood swings -wob improved. Weaning [place] oxygen  Plan Change to SDU status  NIPPV to PRN Cont pulse ox IV azithro and cefepime day 2 F/u urinary antigens Day 2/5 systemic steroids. Change to 30mg /d  Scheduled BDs Lasix today  Mobilize   Sepsis 2/2 above.  Lactate nml  Plan  Abx as above Dc IVFs  Mild acute encephalopathy. Initially was likely hypercarbia. Now fully awake but having some word finding trouble at times ? Steroids Plan Supportive care Holding her clonazepam for now  Dec pred dosing    H/o AF w/ CVR, h/o HTN Has had fall in past. Off DOAC since jan 2024 Plan Cont tele Rate control  Resume her home dilt and Ace I   H/o lung  nodule BRCA2 positive Plan F/u with [facility] Poor candidate for any therapy   H/o seizure disorder Plan Cont  Keppra  HLD Plan Cont statin   Diabetes type II Plan SSI goal 140-180   Iron deficiency anemia Plan Cont iron replacement   H/o anxiety, depression and chronic paresthesia  Plan Cont her lexapro, cont neurontin   DNR STATUS Goals of care discussed with the patient and also w/ her daughter Toniann Fail who is HCPOA. Review of palliative care note by Dr Vassie Loll indeed confirms Ms Krack does not want life support or heroic measures. She does not currently have advanced directives signed but her daughter in full support of Sae agree that maintaining current acute care interventions but should she have cardiac arrest we would not intubate or provide CPR Plan Full dnr  Cont current medical rx Need to fill out MOST form prior to dc  Best Practice (right click and "Reselect all SmartList Selections" daily)   Diet/type: Regular consistency (see orders) DVT prophylaxis: prophylactic heparin  GI prophylaxis: PPI Lines: N/A Foley:  N/A Code Status:  DNR Last date of multidisciplinary goals of care discussion [discussed w/ daughter wendy on admit]  We will s/o. Will place f/u appointment in chart   Critical care time: NA      Simonne Martinet ACNP-BC [facility] Pulmonary/Critical Care Pager # [phone] OR # [phone] if no answer

## 2022-11-20 NOTE — Progress Notes (Signed)
   11/20/22 1015  TOC Brief Assessment  Insurance and Status Reviewed  Patient has primary care physician Yes  Home environment has been reviewed ALF ( [facility])  Prior level of function: Needs assistance  Prior/Current Home Services No current home services  Social Determinants of Health Reivew SDOH reviewed no interventions necessary  Readmission risk has been reviewed Yes  Transition of care needs transition of care needs identified, TOC will continue to follow   TOC will follow for needs.

## 2022-11-20 NOTE — Progress Notes (Signed)
RT note: Pt. seen on BiPAP/CPAP rounds, V-60 remains in room, pt. currently of and in no apparent distress at this time, RN aware.

## 2022-11-20 NOTE — Plan of Care (Signed)
  Problem: Education: Goal: Knowledge of General Education information will improve Description: Including pain rating scale, medication(s)/side effects and non-pharmacologic comfort measures Outcome: Progressing   Problem: Health Behavior/Discharge Planning: Goal: Ability to manage health-related needs will improve Outcome: Progressing   Problem: Clinical Measurements: Goal: Will remain free from infection Outcome: Progressing Goal: Diagnostic test results will improve Outcome: Progressing Goal: Cardiovascular complication will be avoided Outcome: Progressing   Problem: Activity: Goal: Risk for activity intolerance will decrease Outcome: Progressing   Problem: Coping: Goal: Level of anxiety will decrease Outcome: Progressing   Problem: Elimination: Goal: Will not experience complications related to bowel motility Outcome: Progressing Goal: Will not experience complications related to urinary retention Outcome: Progressing   Problem: Pain Management: Goal: General experience of comfort will improve Outcome: Progressing   Problem: Safety: Goal: Ability to remain free from injury will improve Outcome: Progressing   Problem: Clinical Measurements: Goal: Ability to maintain clinical measurements within normal limits will improve Outcome: Not Progressing Goal: Respiratory complications will improve Outcome: Not Progressing   Problem: Nutrition: Goal: Adequate nutrition will be maintained Outcome: Not Progressing   Problem: Skin Integrity: Goal: Risk for impaired skin integrity will decrease Outcome: Not Progressing

## 2022-11-20 NOTE — TOC Initial Note (Addendum)
Transition of Care [facility]) - Initial/Assessment Note    Patient Details  Name: Beth [name] MRN: [id number] Date of Birth: 07-26-1940  Transition [facility] Care [facility]) CM/SW Contact:    Adrian Prows, RN Phone Number: 11/20/2022, 2:00 PM  Clinical Narrative:                 TOC for d/c planning; spoke w/ pt and oldest dtr [place] in room; pt identified POC Legrand Como (dtr) [phone]; pt says she resides at [facility], and she plans to return at d/c; she denies SDOH risks; pt is not sure how she will be transported back to facility; pt says she has glasses, walker, and wheelchair; she has grab bars in shower, and facility provides "assisted showers" for her; pt says she receives PT/OT through facility; her home oxygen is w/ [employer]; pt says she would like a new agency b/c they are not happy w/ service provided by company, but she has a Firefighter; she says her dtr Toniann Fail has spoke w/ agency; pt says she needs a full travel tank; contacted pt's dtr Toniann Fail; she says pt is locked into 5-year contract; she would like to discuss this w/ agency; she also says contract agency for PT/OT at facility is [employer]; previously spoke w/ Andrey Campanile, receptionist for facility at 1029; she says pt is a resident of [place]; LVM for admission to see pt's exact needs, and contract agencies; Morrie Sheldon at [place] notified pt in hospital; he will contact pt's dtr Toniann Fail to discuss contract; Dr Jerral Ralph notified pt receiving PT/OT at facility; also pt says she is normally continent of bowel and bladder; she is able to feed and dress w/ assistance; pt says she has dry skin; TOC is following.  Expected Discharge Plan: Skilled Nursing Facility Barriers to Discharge: Continued Medical Work up   Patient Goals and CMS Choice Patient states their goals for this hospitalization and ongoing recovery are:: back to [facility] [email or web address] Compare Post Acute Care list provided to:: Patient        Expected  Discharge Plan and Services   Discharge Planning Services: CM Consult   Living arrangements for the past 2 months: Skilled Nursing Facility                                      Prior Living Arrangements/Services Living arrangements for the past 2 months: Skilled Nursing Facility Lives with:: Facility Resident Patient language and need for interpreter reviewed:: Yes Do you feel safe going back to the place where you live?: Yes      Need for Family Participation in Patient Care: Yes (Comment) Care giver support system in place?: Yes (comment) Current home services: DME (home oxygen w/ [employer]; PT/OT at [facility]) Criminal Activity/Legal Involvement Pertinent to Current Situation/Hospitalization: No - Comment as needed  Activities of Daily Living   ADL Screening (condition at time of admission) Is the patient deaf or have difficulty hearing?: No Does the patient have difficulty seeing, even when wearing glasses/contacts?: Yes Does the patient have difficulty concentrating, remembering, or making decisions?: No  Permission Sought/Granted Permission sought to share information with : Case Manager Permission granted to share information with : Yes, Verbal Permission Granted  Share Information with NAME: Case Manager     Permission granted to share info w Relationship: Legrand Como (dtr) [phone]     Emotional Assessment Appearance:: Appears stated age Attitude/Demeanor/Rapport:  Gracious Affect (typically observed): Accepting Orientation: : Oriented to Self, Oriented to Place, Oriented to  Time, Oriented to Situation Alcohol / Substance Use: Not Applicable Psych Involvement: No (comment)  Admission diagnosis:  Severe sepsis (HCC) [A41.9, R65.20] Acute on chronic respiratory failure (HCC) [J96.20] Patient Active Problem List   Diagnosis Date Noted   Acute on chronic respiratory failure (HCC) 11/19/2022   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, with anxiety (HCC) 05/28/2022   UTI (urinary tract infection) 11/28/2021   Anemia 11/28/2021   Acute rhinosinusitis 11/28/2021   Cor pulmonale (chronic) (HCC) 11/06/2021   Globus sensation 08/08/2021   Palliative care encounter 07/15/2021   Caregiver burden 07/15/2021   Pleural effusion 07/08/2021   Bronchiectasis without complication (HCC) 12/28/2020   Pulmonary nodule 1 cm or greater in diameter 12/28/2020   Dysuria 12/18/2020   Leukocytosis 12/18/2020   Leg weakness 10/30/2020   Vascular dementia with behavior disturbance (HCC) 09/06/2020   Permanent atrial fibrillation with rapid ventricular response (HCC) 09/06/2020   Breast cancer screening, high risk patient 10/25/2019   Paroxysmal atrial fibrillation (HCC) 07/11/2019   Carotid artery stenosis 06/20/2019   Stroke (HCC) 06/20/2019   Degenerative arthritis of left foot 06/14/2019   Left ankle sprain 06/14/2019   Acute right arterial ischemic stroke, MCA (middle cerebral artery) (HCC) 05/24/2019   Stenosis of right carotid artery    Atrial fibrillation with RVR (HCC) 05/18/2019   Seizures (HCC)    HLD (hyperlipidemia)    Macrocytosis    Acute ischemic right MCA stroke (HCC)    Depression    Allergic rhinitis 05/02/2019   At high risk for breast cancer 12/09/2017   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   Obesity, morbid, BMI 40.0-49.9 (HCC) 04/30/2016   Chronic respiratory failure (HCC) 12/11/2015   COPD with acute exacerbation (HCC) 10/08/2015   Genetic testing 08/26/2015   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   Family history of ovarian cancer 07/26/2015   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 [place] [address] Phone: [phone] Fax: [phone]  [facility] [address] - [place], [place] - [address] [place] [address] Phone: [phone] Fax: [phone]  [facility] - [place], [place] - [address]  [place] [address] Phone: [phone] Fax: [phone]     Social Determinants of Health (SDOH) Social History: SDOH Screenings   Food Insecurity: No Food Insecurity (11/20/2022)  Housing: Low Risk  (11/20/2022)  Transportation Needs: No Transportation Needs (11/20/2022)  Utilities: Not At Risk (11/20/2022)  Depression (PHQ2-9): Low Risk  (11/21/2019)  Tobacco Use: Medium Risk (11/19/2022)   SDOH Interventions: Food Insecurity Interventions: Intervention Not Indicated, Inpatient TOC Housing Interventions: Intervention Not Indicated, Inpatient TOC Transportation Interventions: Intervention Not Indicated, Inpatient TOC Utilities Interventions: Intervention Not Indicated, Inpatient TOC   Readmission Risk Interventions    11/20/2022    1:56 PM  Readmission Risk Prevention Plan  Transportation Screening Complete  PCP or Specialist Appt within 5-7 Days Complete  Home Care Screening Complete  Medication Review (RN CM) Complete

## 2022-11-20 NOTE — Progress Notes (Addendum)
   11/20/22 0328  Therapy Vitals  Pulse Rate 94  BP (!) 154/133  Patient Position (if appropriate) Lying  Oxygen Therapy/Pulse Ox  O2 Device Nasal Cannula  O2 Therapy Oxygen  O2 Flow Rate (L/min) 5 L/min   Pt seen during rounds, found off bipap on 5l nasal cannula.  Pt is awake, alert, watching tv.  Pt was taken off bipap by RN per pt request.

## 2022-11-20 NOTE — Progress Notes (Signed)
PROGRESS NOTE    Beth [name]  QIO:[id number] DOB: 07/26/1940 DOA: 11/19/2022 PCP: Sigmund Hazel, MD    Brief Narrative:  82 year old with chronic hypoxemic respiratory failure, COPD on 4 L oxygen at baseline, obesity, right lower lobe lung mass presented to the ER with about 2 weeks of cough and shortness of breath.  She was treated outpatient with antibiotics.  Continued to have worsening symptoms.  On arrival in the emergency room she was 70% on room air.  Tachycardic and tachypneic.  Diaphoretic.  Chest x-ray showed airspace disease right more than left.  WBC count 25.9.  COVID-negative.  pCO2 105.  Patient was placed on BiPAP and admitted to ICU, stabilized and transferred out of ICU same day.  Subjective: Patient seen and examined.  Sitting in the bed.  Tremulous.  On 5 L of oxygen.  Patient currently denies any shortness of breath but she has not mobilized yet.  She lives in an assisted living facility.  Patient tells me that she will need a different device company to supply oxygens.  No other events overnight. Hungry.  She has been kept NPO.  No family at the bedside.   Assessment & Plan:   Acute on chronic hypoxemic and hypercarbic respiratory failure secondary to pneumonia, COPD exacerbation: Initially presented with respiratory distress and hypoxemia, increased work of breathing.  Oxygen saturation 70% on 4 L. Aggressive bronchodilator therapy, IV steroids, inhalational steroids, scheduled and as needed bronchodilators, deep breathing exercises, incentive spirometry, chest physiotherapy and respiratory therapy consult. Patient remains on azithromycin and cefepime. Supplemental oxygen to keep saturations more than 90%. BiPAP as needed for respiratory distress. Blood cultures, sputum cultures pending so far. Legionella and strep, ordered but not collected yet.  Sepsis present on admission.  As above.  Sepsis physiology improving.  On antibiotics.  History of A-fib, hypertension.   Heart rate is controlled.  Sinus rhythm.  She is not on anticoagulation due to fall risk.  History of lung nodules.  Patient does have diagnosed lung nodules.  Unable to biopsy.  Under surveillance from pulmonary office.  Seizure disorder, on Keppra.  Continue.  Hyperlipidemia, on statin.  Continued.  Anxiety depression and chronic paresthesia, on Lexapro and Neurontin.  Goal of care: Patient will need ongoing support, may benefit with palliative or hospice follow-up at her assisted living facility.  Will consult palliative care team today.   DVT prophylaxis: heparin injection 5,000 Units Start: 11/19/22 1415   Code Status: DNR with limited intervention Family Communication: None at the bedside Disposition Plan: Status is: Inpatient Remains inpatient appropriate because: Needing BiPAP, respiratory distress     Consultants:  Pulmonary Palliative  Procedures:  None  Antimicrobials:  Azithromycin and cefepime 11/6---     Objective: Vitals:   11/20/22 0625 11/20/22 0800 11/20/22 0817 11/20/22 1056  BP: (!) 168/79   (!) 161/109  Pulse: 89     Resp: 19     Temp:  98.6 F (37 C)    TempSrc:  Oral    SpO2: 93%  95%   Weight:      Height:        Intake/Output Summary (Last 24 hours) at 11/20/2022 1127 Last data filed at 11/20/2022 0944 Gross per 24 hour  Intake 1404.42 ml  Output 1225 ml  Net 179.42 ml   Filed Weights   11/19/22 1106 11/19/22 1539 11/20/22 0500  Weight: 108.9 kg 109.7 kg 112.7 kg    Examination:  General exam: Appears calm and slightly anxious.  Sitting in chair.  In mild respiratory distress.  Able to communicate.  Pleasant to conversation. Respiratory system: Poor bilateral air entry.  No added sounds. SpO2: 95 % O2 Flow Rate (L/min): 5 L/min FiO2 (%): 35 %  Cardiovascular system: S1 & S2 heard, RRR.  Gastrointestinal system: Soft.  Nontender.  Bowel sound present. Central nervous system: Alert and oriented. No focal neurological  deficits. Extremities: Symmetric 5 x 5 power.     Data Reviewed: I have personally reviewed following labs and imaging studies  CBC: Recent Labs  Lab 11/19/22 0901 11/19/22 1909 11/20/22 0631  WBC 25.9* 20.3* 18.9*  NEUTROABS 19.9*  --  16.9*  HGB 12.3 10.9* 10.7*  HCT 39.3 34.1* 33.5*  MCV 106.5* 104.6* 103.1*  PLT 316 259 259   Basic Metabolic Panel: Recent Labs  Lab 11/19/22 0901 11/19/22 1714 11/20/22 0315  NA 137  --  136  K 4.7  --  4.2  CL 97*  --  96*  CO2 31  --  30  GLUCOSE 161*  --  164*  BUN 8  --  15  CREATININE 0.44 0.62 0.66  CALCIUM 8.8*  --  8.6*  MG  --   --  1.9  PHOS  --   --  2.5   GFR: Estimated Creatinine Clearance: 67.8 mL/min (by C-G formula based on SCr of 0.66 mg/dL). Liver Function Tests: Recent Labs  Lab 11/19/22 0901  AST 23  ALT 22  ALKPHOS 66  BILITOT 0.5  PROT 8.1  ALBUMIN 3.4*   No results for input(s): "LIPASE", "AMYLASE" in the last 168 hours. No results for input(s): "AMMONIA" in the last 168 hours. Coagulation Profile: Recent Labs  Lab 11/19/22 0901  INR 1.0   Cardiac Enzymes: No results for input(s): "CKTOTAL", "CKMB", "CKMBINDEX", "TROPONINI" in the last 168 hours. BNP (last 3 results) No results for input(s): "PROBNP" in the last 8760 hours. HbA1C: No results for input(s): "HGBA1C" in the last 72 hours. CBG: Recent Labs  Lab 11/19/22 1605 11/19/22 2006 11/19/22 2325 11/20/22 0250 11/20/22 0844  GLUCAP 172* 155* 166* 158* 134*   Lipid Profile: No results for input(s): "CHOL", "HDL", "LDLCALC", "TRIG", "CHOLHDL", "LDLDIRECT" in the last 72 hours. Thyroid Function Tests: No results for input(s): "TSH", "T4TOTAL", "FREET4", "T3FREE", "THYROIDAB" in the last 72 hours. Anemia Panel: No results for input(s): "VITAMINB12", "FOLATE", "FERRITIN", "TIBC", "IRON", "RETICCTPCT" in the last 72 hours. Sepsis Labs: Recent Labs  Lab 11/19/22 0923 11/19/22 1714  PROCALCITON  --  <0.10  LATICACIDVEN 0.5 1.2     Recent Results (from the past 240 hour(s))  Blood Culture (routine x 2)     Status: None (Preliminary result)   Collection Time: 11/19/22  9:00 AM   Specimen: BLOOD  Result Value Ref Range Status   Specimen Description   Final    BLOOD SITE NOT SPECIFIED Performed at [facility], [address] [place], [place] [address]    Special Requests   Final    BOTTLES DRAWN AEROBIC AND ANAEROBIC Blood Culture adequate volume Performed at [facility], [address], [place], [place] [address]    Culture   Final    NO GROWTH < 24 HOURS Performed at [facility], [address], [place], [place] [address]    Report Status PENDING  Incomplete  SARS Coronavirus 2 by RT PCR (hospital order, performed in [facility] hospital lab) *cepheid single result test* Anterior Nasal Swab  Status: None   Collection Time: 11/19/22  9:01 AM   Specimen: Anterior Nasal Swab  Result Value Ref Range Status   SARS Coronavirus 2 by RT PCR NEGATIVE NEGATIVE Final    Comment: (NOTE) SARS-CoV-2 target nucleic acids are NOT DETECTED.  The SARS-CoV-2 RNA is generally detectable in upper and lower respiratory specimens during the acute phase of infection. The lowest concentration of SARS-CoV-2 viral copies this assay can detect is 250 copies / mL. A negative result does not preclude SARS-CoV-2 infection and should not be used as the sole basis for treatment or other patient management decisions.  A negative result may occur with improper specimen collection / handling, submission of specimen other than nasopharyngeal swab, presence of viral mutation(s) within the areas targeted by this assay, and inadequate number of viral copies (<250 copies / mL). A negative result must be combined with clinical observations, patient history, and epidemiological information.  Fact Sheet for Patients:   RoadLapTop.co.za  Fact Sheet for Healthcare  Providers: http://kim-miller.com/  This test is not yet approved or  cleared by the [place] FDA and has been authorized for detection and/or diagnosis of SARS-CoV-2 by FDA under an Emergency Use Authorization (EUA).  This EUA will remain in effect (meaning this test can be used) for the duration of the COVID-19 declaration under Section 564(b)(1) of the Act, 21 U.S.C. section 360bbb-3(b)(1), unless the authorization is terminated or revoked sooner.  Performed at [facility], [address], [place], [place] [address]   Blood Culture (routine x 2)     Status: None (Preliminary result)   Collection Time: 11/19/22  9:04 AM   Specimen: BLOOD  Result Value Ref Range Status   Specimen Description   Final    BLOOD RIGHT ANTECUBITAL Performed at [facility], [address] [place], [place] [address]    Special Requests   Final    BOTTLES DRAWN AEROBIC AND ANAEROBIC Blood Culture adequate volume Performed at [facility], [address] [place], [place] [address]    Culture   Final    NO GROWTH < 24 HOURS Performed at [facility], [address], [place], [place] [address]    Report Status PENDING  Incomplete  MRSA Next Gen by PCR, Nasal     Status: None   Collection Time: 11/19/22 12:46 PM   Specimen: Nasal Mucosa; Nasal Swab  Result Value Ref Range Status   MRSA by PCR Next Gen NOT DETECTED NOT DETECTED Final    Comment: (NOTE) The GeneXpert MRSA Assay (FDA approved for NASAL specimens only), is one component of a comprehensive MRSA colonization surveillance program. It is not intended to diagnose MRSA infection nor to guide or monitor treatment for MRSA infections. Test performance is not FDA approved in patients less than 19 years old. Performed at [facility], [address], [place], [place] [address]   Culture, blood (Routine X 2) w Reflex to ID Panel     Status:  None (Preliminary result)   Collection Time: 11/19/22  5:14 PM   Specimen: BLOOD  Result Value Ref Range Status   Specimen Description   Final    BLOOD BLOOD LEFT ARM Performed at [facility], [address], [place], [place] [address]    Special Requests   Final    BOTTLES DRAWN AEROBIC ONLY Blood Culture adequate volume Performed at [facility], [address], [place], [place] [address]  Culture   Final    NO GROWTH < 12 HOURS Performed at [facility], [address], [place], [place] [address]    Report Status PENDING  Incomplete  Culture, blood (Routine X 2) w Reflex to ID Panel     Status: None (Preliminary result)   Collection Time: 11/19/22  5:14 PM   Specimen: BLOOD  Result Value Ref Range Status   Specimen Description   Final    BLOOD BLOOD LEFT HAND Performed at [facility], [address], [place], [place] [address]    Special Requests   Final    BOTTLES DRAWN AEROBIC AND ANAEROBIC Blood Culture adequate volume Performed at [facility], [address] [place], [place] [address]    Culture   Final    NO GROWTH < 12 HOURS Performed at [facility], [address] [place], [place] [address]    Report Status PENDING  Incomplete         Radiology Studies: DG Chest Port 1 View  Result Date: 11/19/2022 CLINICAL DATA:  Questionable sepsis.  Shortness of breath. EXAM: PORTABLE CHEST 1 VIEW COMPARISON:  02/03/2022; 11/25/2021; PET-CT-11/08/2022; chest CT-01/28/2022 FINDINGS: Unchanged cardiac silhouette and mediastinal contours with atherosclerotic plaque within the aortic arch. Potential developing airspace opacity within the peripheral aspect of the right mid lung. Known right lower lung pulmonary nodules not well demonstrated on this AP portable examination. Unchanged punctate left upper lung granuloma. Trace bilateral effusions are not excluded. No pneumothorax. No  evidence of edema. No acute osseous abnormalities. IMPRESSION: 1. Potential developing airspace opacity within the peripheral aspect of the right mid lung, potentially atelectasis though worrisome for developing infection. 2. Known right lower lung pulmonary nodules not well demonstrated on this AP portable examination. Electronically Signed   By: Simonne Come M.D.   On: 11/19/2022 10:57        Scheduled Meds:  arformoterol  15 mcg Nebulization BID   aspirin EC  81 mg Oral Daily   atorvastatin  20 mg Oral Daily   budesonide (PULMICORT) nebulizer solution  0.5 mg Nebulization BID   captopril  50 mg Oral BID   Chlorhexidine Gluconate Cloth  6 each Topical Q0600   cholecalciferol  2,000 Units Oral Daily   cyanocobalamin  500 mcg Oral Daily   diltiazem  360 mg Oral Daily   escitalopram  20 mg Oral Daily   ferrous sulfate  324 mg Oral Q breakfast   fluticasone  2 spray Each Nare Daily   gabapentin  300 mg Oral QHS   heparin  5,000 Units Subcutaneous Q8H   levETIRAcetam  750 mg Oral BID   pantoprazole  40 mg Oral BID   [START ON 11/21/2022] predniSONE  30 mg Oral Q breakfast   revefenacin  175 mcg Nebulization Daily   sodium chloride flush  3 mL Intravenous Q12H   Continuous Infusions:  azithromycin Stopped (11/20/22 0026)   ceFEPime (MAXIPIME) IV Stopped (11/20/22 1057)     LOS: 1 day    Time spent: 40 minutes    Dorcas Carrow, MD [facility]

## 2022-11-20 NOTE — Progress Notes (Signed)
Daughter Toniann Fail [phone] updated  Simonne Martinet ACNP-BC [facility] Pulmonary/Critical Care Pager # [phone] OR # [phone] if no answer

## 2022-11-20 NOTE — Telephone Encounter (Signed)
PT is still in hospital and has a appt w/Dr. Vassie Loll in Dec. PT will keep that appt and hope she is out by then. NFN

## 2022-11-20 NOTE — Progress Notes (Signed)
  Echocardiogram 2D Echocardiogram has been performed.  Beth [name] 11/20/2022, 4:01 PM

## 2022-11-21 DIAGNOSIS — J9622 Acute and chronic respiratory failure with hypercapnia: Secondary | ICD-10-CM | POA: Diagnosis not present

## 2022-11-21 DIAGNOSIS — J9621 Acute and chronic respiratory failure with hypoxia: Secondary | ICD-10-CM | POA: Diagnosis not present

## 2022-11-21 LAB — ECHOCARDIOGRAM COMPLETE
Area-P 1/2: 3.42 cm2
Calc EF: 76.8 %
Height: 64 in
MV VTI: 2.95 cm2
S' Lateral: 4.1 cm
Single Plane A2C EF: 74 %
Single Plane A4C EF: 75.5 %
Weight: 3975.33 [oz_av]

## 2022-11-21 LAB — PROCALCITONIN: Procalcitonin: <0.1 ng/mL

## 2022-11-21 LAB — GLUCOSE, CAPILLARY
Glucose-Capillary: 145 mg/dL — ABNORMAL HIGH (ref 70–99)
Glucose-Capillary: 138 mg/dL — ABNORMAL HIGH (ref 70–99)
Glucose-Capillary: 182 mg/dL — ABNORMAL HIGH (ref 70–99)
Glucose-Capillary: 115 mg/dL — ABNORMAL HIGH (ref 70–99)
Glucose-Capillary: 163 mg/dL — ABNORMAL HIGH (ref 70–99)

## 2022-11-21 MED ORDER — DM-GUAIFENESIN ER 30-600 MG PO TB12
1.0000 | ORAL_TABLET | Freq: Two times a day (BID) | ORAL | Status: DC
  Administered 2022-11-21 – 2022-11-25 (×9): 1 via ORAL
  Filled 2022-11-21 (×9): qty 1

## 2022-11-21 MED ORDER — MORPHINE SULFATE (PF) 2 MG/ML IV SOLN
2.0000 mg | INTRAVENOUS | Status: DC | PRN
  Administered 2022-11-21 – 2022-11-22 (×2): 2 mg via INTRAVENOUS
  Filled 2022-11-21 (×3): qty 1

## 2022-11-21 MED ORDER — FUROSEMIDE 10 MG/ML IJ SOLN
40.0000 mg | Freq: Once | INTRAMUSCULAR | Status: AC
  Administered 2022-11-21: 40 mg via INTRAVENOUS
  Filled 2022-11-21: qty 4

## 2022-11-21 MED ORDER — GUAIFENESIN 100 MG/5ML PO LIQD: 10.0000 mL | Freq: Four times a day (QID) | ORAL | Status: DC | PRN

## 2022-11-21 NOTE — Progress Notes (Signed)
Pt agreed to take a partial bed bath and allow for the change of linen.

## 2022-11-21 NOTE — Plan of Care (Signed)
  Problem: Education: Goal: Knowledge of General Education information will improve Description: Including pain rating scale, medication(s)/side effects and non-pharmacologic comfort measures Outcome: Progressing   Problem: Clinical Measurements: Goal: Ability to maintain clinical measurements within normal limits will improve Outcome: Progressing Goal: Diagnostic test results will improve Outcome: Progressing Goal: Respiratory complications will improve Outcome: Progressing Goal: Cardiovascular complication will be avoided Outcome: Progressing   Problem: Activity: Goal: Risk for activity intolerance will decrease Outcome: Progressing   Problem: Nutrition: Goal: Adequate nutrition will be maintained Outcome: Progressing   Problem: Coping: Goal: Level of anxiety will decrease Outcome: Progressing   Problem: Elimination: Goal: Will not experience complications related to bowel motility Outcome: Progressing Goal: Will not experience complications related to urinary retention Outcome: Progressing   Problem: Pain Management: Goal: General experience of comfort will improve Outcome: Progressing   Problem: Safety: Goal: Ability to remain free from injury will improve Outcome: Progressing   Problem: Skin Integrity: Goal: Risk for impaired skin integrity will decrease Outcome: Progressing

## 2022-11-21 NOTE — Plan of Care (Signed)

## 2022-11-21 NOTE — Progress Notes (Signed)
This RN noted that pt had urinated on bed pad/bedding and offered to clean pt up and change linens. Pt then denied offer and stated that she was too sleepy and is comfortable. She the stated that she's been wetter and implied that she was fine. This RN provided education on the importance of hygiene to prevent skin breakdown.

## 2022-11-21 NOTE — Progress Notes (Signed)
PHARMACY NOTE -  Cefepime  Pharmacy has been assisting with dosing of Cefepime for pneumonia. Dosage remains stable at 2g IV q8 hr and further renal adjustments per institutional Pharmacy antibiotic protocol  Pharmacy will sign off, following peripherally for culture results, dose adjustments, and length of therapy. Please reconsult if a change in clinical status warrants re-evaluation of dosage.  Bernadene Person, PharmD, BCPS [phone] 11/21/2022, 12:18 PM

## 2022-11-21 NOTE — Progress Notes (Signed)
PROGRESS NOTE    Beth [name]  JOA:[id number] DOB: August 12, 1940 DOA: 11/19/2022 PCP: Sigmund Hazel, MD    Brief Narrative:  82 year old with chronic hypoxemic respiratory failure, COPD on 4 L oxygen at baseline, obesity, right lower lobe lung mass presented to the ER with about 2 weeks of cough and shortness of breath.  She was treated outpatient with antibiotics.  Continued to have worsening symptoms.  On arrival in the emergency room she was 70% on room air.  Tachycardic and tachypneic.  Diaphoretic.  Chest x-ray showed airspace disease right more than left.  WBC count 25.9.  COVID-negative.  pCO2 105.  Patient was placed on BiPAP and admitted to ICU, stabilized and transferred out of ICU same day. Patient is gradually improving.  Overall poor clinical status.  She lives in an assisted living facility.  Subjective:  Patient seen and examined.  He still has burning urination.  She has a lot of phlegm but unable to expectorate any sputum.  She also has some nasal stuffiness.  Afebrile.  On oxygen.  She was attempted BiPAP last night, patient denied being acutely short of breath. In anticipation for transition home, discontinue BiPAP orders as she does not use it at home. Can transfer to MedSurg bed.    Assessment & Plan:   Acute on chronic hypoxemic and hypercarbic respiratory failure secondary to pneumonia, COPD exacerbation: Initially presented with respiratory distress and hypoxemia, increased work of breathing.  Oxygen saturation 70% on 4 L. Continue bronchodilator therapy, steroid taper with prednisone.  Do breathing exercises.  Start incentive spirometry, chest physiotherapy and respiratory therapy consult. Patient remains on azithromycin and cefepime.  Will complete total 7 days of antibiotic therapy.  Will change to oral on discharge. Supplemental oxygen to keep saturations more than 90%. Discontinue BiPAP. Blood cultures, sputum cultures pending so far. Legionella and strep, none  collected. Sepsis present on admission.  As above.  Sepsis physiology improving.  On antibiotics.  History of A-fib, hypertension.  Heart rate is controlled.  Sinus rhythm.  She is not on anticoagulation due to fall risk.  History of lung nodules.  Patient does have diagnosed lung nodules.  Unable to biopsy.  Under surveillance from pulmonary office.  She had a PET scan done on 10/25.  She will follow-up with her pulmonology.  Seizure disorder, on Keppra.  Continue.  Hyperlipidemia, on statin.  Continued.  Anxiety depression and chronic paresthesia, on Lexapro and Neurontin.  Goal of care: DNR with limited intervention.  Outpatient palliative care follow-up on discharge.  Discharge to ALF next 24 to 48 hours.  DVT prophylaxis: heparin injection 5,000 Units Start: 11/19/22 1415   Code Status: DNR with limited intervention Family Communication: None at the bedside Disposition Plan: Status is: Inpatient Remains inpatient appropriate because: Significant respiratory symptoms.     Consultants:  Pulmonary Palliative  Procedures:  None  Antimicrobials:  Azithromycin and cefepime 11/6---     Objective: Vitals:   11/21/22 0500 11/21/22 0605 11/21/22 0753 11/21/22 0953  BP:  (!) 177/82  (!) 163/78  Pulse:  89    Resp:  16    Temp:   97.6 F (36.4 C)   TempSrc:   Oral   SpO2:  94% 96%   Weight: 110 kg     Height:        Intake/Output Summary (Last 24 hours) at 11/21/2022 1057 Last data filed at 11/21/2022 1013 Gross per 24 hour  Intake 528.72 ml  Output 2550 ml  Net -2021.28  ml   Filed Weights   11/19/22 1539 11/20/22 0500 11/21/22 0500  Weight: 109.7 kg 112.7 kg 110 kg    Examination:  General: Chronically sick looking.  Frail.  Comfortable and pleasant interaction today.  She is on 4 to 5 L of oxygen.  She has slight resting tremors. Cardiovascular: S1-S2 normal.  Regular rate rhythm. Respiratory: Some conducted upper airway sounds. Gastrointestinal: Obese  and pendulous.  Nontender.  Bowel sound present. Ext: No swelling or edema.  No cyanosis. Neuro: Alert awake and oriented.  No focal neurological deficits.  Generalized weakness.      Data Reviewed: I have personally reviewed following labs and imaging studies  CBC: Recent Labs  Lab 11/19/22 0901 11/19/22 1909 11/20/22 0631  WBC 25.9* 20.3* 18.9*  NEUTROABS 19.9*  --  16.9*  HGB 12.3 10.9* 10.7*  HCT 39.3 34.1* 33.5*  MCV 106.5* 104.6* 103.1*  PLT 316 259 259   Basic Metabolic Panel: Recent Labs  Lab 11/19/22 0901 11/19/22 1714 11/20/22 0315  NA 137  --  136  K 4.7  --  4.2  CL 97*  --  96*  CO2 31  --  30  GLUCOSE 161*  --  164*  BUN 8  --  15  CREATININE 0.44 0.62 0.66  CALCIUM 8.8*  --  8.6*  MG  --   --  1.9  PHOS  --   --  2.5   GFR: Estimated Creatinine Clearance: 66.9 mL/min (by C-G formula based on SCr of 0.66 mg/dL). Liver Function Tests: Recent Labs  Lab 11/19/22 0901  AST 23  ALT 22  ALKPHOS 66  BILITOT 0.5  PROT 8.1  ALBUMIN 3.4*   No results for input(s): "LIPASE", "AMYLASE" in the last 168 hours. No results for input(s): "AMMONIA" in the last 168 hours. Coagulation Profile: Recent Labs  Lab 11/19/22 0901  INR 1.0   Cardiac Enzymes: No results for input(s): "CKTOTAL", "CKMB", "CKMBINDEX", "TROPONINI" in the last 168 hours. BNP (last 3 results) No results for input(s): "PROBNP" in the last 8760 hours. HbA1C: No results for input(s): "HGBA1C" in the last 72 hours. CBG: Recent Labs  Lab 11/20/22 1935 11/20/22 2205 11/20/22 2330 11/21/22 0352 11/21/22 0744  GLUCAP 202* 176* 152* 145* 138*   Lipid Profile: No results for input(s): "CHOL", "HDL", "LDLCALC", "TRIG", "CHOLHDL", "LDLDIRECT" in the last 72 hours. Thyroid Function Tests: No results for input(s): "TSH", "T4TOTAL", "FREET4", "T3FREE", "THYROIDAB" in the last 72 hours. Anemia Panel: No results for input(s): "VITAMINB12", "FOLATE", "FERRITIN", "TIBC", "IRON",  "RETICCTPCT" in the last 72 hours. Sepsis Labs: Recent Labs  Lab 11/19/22 0923 11/19/22 1714 11/21/22 0329  PROCALCITON  --  <0.10 <0.10  LATICACIDVEN 0.5 1.2  --     Recent Results (from the past 240 hour(s))  Blood Culture (routine x 2)     Status: None (Preliminary result)   Collection Time: 11/19/22  9:00 AM   Specimen: BLOOD  Result Value Ref Range Status   Specimen Description   Final    BLOOD SITE NOT SPECIFIED Performed at [facility], [address] [place], [place] [address]    Special Requests   Final    BOTTLES DRAWN AEROBIC AND ANAEROBIC Blood Culture adequate volume Performed at [facility], [address] [place], [place] [address]    Culture   Final    NO GROWTH 2 DAYS Performed at [facility], [address] [place], [place] [address]  Report Status PENDING  Incomplete  SARS Coronavirus 2 by RT PCR (hospital order, performed in [facility] hospital lab) *cepheid single result test* Anterior Nasal Swab     Status: None   Collection Time: 11/19/22  9:01 AM   Specimen: Anterior Nasal Swab  Result Value Ref Range Status   SARS Coronavirus 2 by RT PCR NEGATIVE NEGATIVE Final    Comment: (NOTE) SARS-CoV-2 target nucleic acids are NOT DETECTED.  The SARS-CoV-2 RNA is generally detectable in upper and lower respiratory specimens during the acute phase of infection. The lowest concentration of SARS-CoV-2 viral copies this assay can detect is 250 copies / mL. A negative result does not preclude SARS-CoV-2 infection and should not be used as the sole basis for treatment or other patient management decisions.  A negative result may occur with improper specimen collection / handling, submission of specimen other than nasopharyngeal swab, presence of viral mutation(s) within the areas targeted by this assay, and inadequate number of viral copies (<250 copies / mL). A negative result must be combined with  clinical observations, patient history, and epidemiological information.  Fact Sheet for Patients:   RoadLapTop.co.za  Fact Sheet for Healthcare Providers: http://kim-miller.com/  This test is not yet approved or  cleared by the [place] FDA and has been authorized for detection and/or diagnosis of SARS-CoV-2 by FDA under an Emergency Use Authorization (EUA).  This EUA will remain in effect (meaning this test can be used) for the duration of the COVID-19 declaration under Section 564(b)(1) of the Act, 21 U.S.C. section 360bbb-3(b)(1), unless the authorization is terminated or revoked sooner.  Performed at [facility], [address] [place], [place] [address]   Blood Culture (routine x 2)     Status: None (Preliminary result)   Collection Time: 11/19/22  9:04 AM   Specimen: BLOOD  Result Value Ref Range Status   Specimen Description   Final    BLOOD RIGHT ANTECUBITAL Performed at [facility], [address] [place], [place] [address]    Special Requests   Final    BOTTLES DRAWN AEROBIC AND ANAEROBIC Blood Culture adequate volume Performed at [name], [address] [place], [place] [address]    Culture   Final    NO GROWTH 2 DAYS Performed at [facility], [address], [place], [place] [address]    Report Status PENDING  Incomplete  MRSA Next Gen by PCR, Nasal     Status: None   Collection Time: 11/19/22 12:46 PM   Specimen: Nasal Mucosa; Nasal Swab  Result Value Ref Range Status   MRSA by PCR Next Gen NOT DETECTED NOT DETECTED Final    Comment: (NOTE) The GeneXpert MRSA Assay (FDA approved for NASAL specimens only), is one component of a comprehensive MRSA colonization surveillance program. It is not intended to diagnose MRSA infection nor to guide or monitor treatment for MRSA infections. Test performance is not FDA approved in patients less  than 41 years old. Performed at [facility], [address], [place], [place] [address]   Culture, blood (Routine X 2) w Reflex to ID Panel     Status: None (Preliminary result)   Collection Time: 11/19/22  5:14 PM   Specimen: BLOOD LEFT ARM  Result Value Ref Range Status   Specimen Description   Final    BLOOD LEFT ARM Performed at [facility], [address], [place], [place] [address]  Special Requests   Final    BOTTLES DRAWN AEROBIC ONLY Blood Culture adequate volume Performed at [facility], [address] [name], [place] [address]    Culture   Final    NO GROWTH 2 DAYS Performed at [facility], [address], [place], [place] [address]    Report Status PENDING  Incomplete  Culture, blood (Routine X 2) w Reflex to ID Panel     Status: None (Preliminary result)   Collection Time: 11/19/22  5:14 PM   Specimen: BLOOD LEFT HAND  Result Value Ref Range Status   Specimen Description   Final    BLOOD LEFT HAND Performed at [facility], [address], [place], [place] [address]    Special Requests   Final    BOTTLES DRAWN AEROBIC AND ANAEROBIC Blood Culture adequate volume Performed at [facility], [address], [place], [place] [address]    Culture   Final    NO GROWTH 2 DAYS Performed at [facility], [address] [place], [place] [address]    Report Status PENDING  Incomplete         Radiology Studies: ECHOCARDIOGRAM COMPLETE  Result Date: 11/21/2022    ECHOCARDIOGRAM REPORT   Patient Name:   ENEDINA [name] Date of Exam: 11/20/2022 Medical Rec #:  [id number]      Height:       64.0 in Accession #:    [id number]     Weight:       248.5 lb Date of Birth:  [birth year]       BSA:          2.145 m Patient Age:    82 years       BP:           161/109 mmHg Patient Gender: F              HR:           121 bpm. Exam Location:  Inpatient Procedure: 2D Echo, Cardiac Doppler, Color  Doppler and Intracardiac            Opacification Agent Indications:    R06.02 SOB  History:        Patient has prior history of Echocardiogram examinations, most                 recent 05/17/2019. CHF, Abnormal ECG, Stroke and COPD,                 Arrythmias:Atrial Fibrillation, Signs/Symptoms:Altered Mental                 Status and Alzheimer's; Risk Factors:Hypertension and                 Dyslipidemia. Cor pulmonale.  Sonographer:    Sheralyn Boatman RDCS Referring Phys: [id number] [facility]  Sonographer Comments: Technically difficult study due to poor echo windows and patient is obese. Image acquisition challenging due to patient body habitus. IMPRESSIONS  1. Left ventricular ejection fraction, by estimation, is 60 to 65%. The left ventricle has normal function. The left ventricle has no regional wall motion abnormalities. There is mild left ventricular hypertrophy. Left ventricular diastolic parameters are indeterminate.  2. Right ventricular systolic function is mildly reduced. The right ventricular size is mildly enlarged. There is mildly elevated pulmonary artery systolic pressure. The estimated right ventricular systolic pressure is 42.8 mmHg.  3. Left atrial size was moderately dilated.  4. Right atrial size was moderately dilated.  5. The mitral valve is degenerative. Trivial mitral valve regurgitation. Mild mitral stenosis. The mean mitral valve gradient is 6.0 mmHg with average heart rate of 92 bpm.  6. The aortic valve was not well visualized. Aortic valve regurgitation is not visualized. No aortic stenosis is present.  7. The inferior vena cava is dilated in size with >50% respiratory variability, suggesting right atrial pressure of 8 mmHg. FINDINGS  Left Ventricle: Left ventricular ejection fraction, by estimation, is 60 to 65%. The left ventricle has normal function. The left ventricle has no regional wall motion abnormalities. Definity contrast agent was given IV to delineate the left ventricular   endocardial borders. The left ventricular internal cavity size was normal in size. There is mild left ventricular hypertrophy. Left ventricular diastolic parameters are indeterminate. Right Ventricle: The right ventricular size is mildly enlarged. No increase in right ventricular wall thickness. Right ventricular systolic function is mildly reduced. There is mildly elevated pulmonary artery systolic pressure. The tricuspid regurgitant  velocity is 2.95 m/s, and with an assumed right atrial pressure of 8 mmHg, the estimated right ventricular systolic pressure is 42.8 mmHg. Left Atrium: Left atrial size was moderately dilated. Right Atrium: Right atrial size was moderately dilated. Pericardium: There is no evidence of pericardial effusion. Presence of epicardial fat layer. Mitral Valve: The mitral valve is degenerative in appearance. Mild to moderate mitral annular calcification. Trivial mitral valve regurgitation. Mild mitral valve stenosis. MV peak gradient, 15.1 mmHg. The mean mitral valve gradient is 6.0 mmHg with average heart rate of 92 bpm. Tricuspid Valve: The tricuspid valve is normal in structure. Tricuspid valve regurgitation is mild . No evidence of tricuspid stenosis. Aortic Valve: The aortic valve was not well visualized. Aortic valve regurgitation is not visualized. No aortic stenosis is present. Pulmonic Valve: The pulmonic valve was normal in structure. Pulmonic valve regurgitation is trivial. No evidence of pulmonic stenosis. Aorta: The aortic root is normal in size and structure. Venous: The inferior vena cava is dilated in size with greater than 50% respiratory variability, suggesting right atrial pressure of 8 mmHg. IAS/Shunts: The interatrial septum was not well visualized.  LEFT VENTRICLE PLAX 2D LVIDd:         5.60 cm LVIDs:         4.10 cm LV PW:         1.30 cm LV IVS:        1.10 cm LVOT diam:     2.40 cm LV SV:         89 LV SV Index:   42 LVOT Area:     4.52 cm  LV Volumes (MOD) LV vol d,  MOD A2C: 71.8 ml LV vol d, MOD A4C: 102.0 ml LV vol s, MOD A2C: 18.7 ml LV vol s, MOD A4C: 25.0 ml LV SV MOD A2C:     53.1 ml LV SV MOD A4C:     102.0 ml LV SV MOD BP:      68.2 ml RIGHT VENTRICLE            IVC RV S prime:     8.59 cm/s  IVC diam: 2.30 cm TAPSE (M-mode): 1.4 cm LEFT ATRIUM             Index        RIGHT ATRIUM           Index LA diam:        3.60 cm 1.68 cm/m  RA Area:     18.60 cm LA Vol (A2C):   69.8 ml 32.54 ml/m  RA Volume:   51.40 ml  23.96 ml/m LA Vol (A4C):   83.2 ml 38.78 ml/m LA Biplane Vol: 76.2 ml 35.52 ml/m  AORTIC VALVE LVOT Vmax:   113.00 cm/s LVOT Vmean:  71.300 cm/s LVOT VTI:    0.197 m  AORTA Ao Root diam: 3.60 cm Ao Asc diam:  3.40 cm MITRAL VALVE                TRICUSPID VALVE MV Area (PHT): 3.42 cm     TR Peak grad:   34.8 mmHg MV Area VTI:   2.95 cm     TR Vmax:        295.00 cm/s MV Peak grad:  15.1 mmHg MV Mean grad:  6.0 mmHg     SHUNTS MV Vmax:       1.94 m/s     Systemic VTI:  0.20 m MV Vmean:      115.0 cm/s   Systemic Diam: 2.40 cm MV Decel Time: 222 msec MV E velocity: 139.50 cm/s Weston Brass MD Electronically signed by Weston Brass MD Signature Date/Time: 11/21/2022/12:01:41 AM    Final         Scheduled Meds:  arformoterol  15 mcg Nebulization BID   aspirin EC  81 mg Oral Daily   atorvastatin  20 mg Oral Daily   budesonide (PULMICORT) nebulizer solution  0.5 mg Nebulization BID   captopril  50 mg Oral BID   Chlorhexidine Gluconate Cloth  6 each Topical Q0600   cholecalciferol  2,000 Units Oral Daily   clonazePAM  1 mg Oral QHS   cyanocobalamin  500 mcg Oral Daily   dextromethorphan-guaiFENesin  1 tablet Oral BID   diltiazem  360 mg Oral Daily   escitalopram  20 mg Oral Daily   ferrous sulfate  324 mg Oral Q breakfast   fluticasone  2 spray Each Nare Daily   gabapentin  300 mg Oral QHS   heparin  5,000 Units Subcutaneous Q8H   insulin aspart  0-5 Units Subcutaneous QHS   insulin aspart  0-9 Units Subcutaneous TID WC    levETIRAcetam  750 mg Oral BID   pantoprazole  40 mg Oral BID   predniSONE  30 mg Oral Q breakfast   revefenacin  175 mcg Nebulization Daily   sodium chloride flush  3 mL Intravenous Q12H   Continuous Infusions:  azithromycin Stopped (11/21/22 1130)   ceFEPime (MAXIPIME) IV Stopped (11/21/22 1100)     LOS: 2 days    Time spent: 40 minutes    Dorcas Carrow, MD [facility]

## 2022-11-22 DIAGNOSIS — J9622 Acute and chronic respiratory failure with hypercapnia: Secondary | ICD-10-CM | POA: Diagnosis not present

## 2022-11-22 DIAGNOSIS — J9621 Acute and chronic respiratory failure with hypoxia: Secondary | ICD-10-CM | POA: Diagnosis not present

## 2022-11-22 LAB — GLUCOSE, CAPILLARY
Glucose-Capillary: 97 mg/dL (ref 70–99)
Glucose-Capillary: 159 mg/dL — ABNORMAL HIGH (ref 70–99)
Glucose-Capillary: 145 mg/dL — ABNORMAL HIGH (ref 70–99)
Glucose-Capillary: 230 mg/dL — ABNORMAL HIGH (ref 70–99)

## 2022-11-22 LAB — PROCALCITONIN: Procalcitonin: <0.1 ng/mL

## 2022-11-22 MED ORDER — ALBUTEROL SULFATE (2.5 MG/3ML) 0.083% IN NEBU
2.5000 mg | INHALATION_SOLUTION | Freq: Four times a day (QID) | RESPIRATORY_TRACT | Status: DC | PRN
  Administered 2022-11-22 – 2022-11-23 (×2): 2.5 mg via RESPIRATORY_TRACT
  Filled 2022-11-22: qty 3

## 2022-11-22 NOTE — Plan of Care (Signed)
  Problem: Clinical Measurements: Goal: Respiratory complications will improve Outcome: Progressing Goal: Cardiovascular complication will be avoided Outcome: Progressing   

## 2022-11-22 NOTE — Progress Notes (Signed)
Attempted to give pt nebulizer treatments. Pt says she is unable to do it right now.The Nurse tech came into the room and is helping her get what she needs this morning. I will check back on today to see if she will take her nebulizer.

## 2022-11-22 NOTE — Progress Notes (Signed)
PROGRESS NOTE    Beth [name]  ZOX:[id number] DOB: April 19, 1940 DOA: 11/19/2022 PCP: Sigmund Hazel, MD    Brief Narrative:  82 year old with chronic hypoxemic respiratory failure, COPD on 4 L oxygen at baseline, obesity, right lower lobe lung mass presented to the ER with about 2 weeks of cough and shortness of breath.  She was treated outpatient with antibiotics.  Continued to have worsening symptoms.  On arrival in the emergency room she was 70% on room air.  Tachycardic and tachypneic.  Diaphoretic.  Chest x-ray showed airspace disease right more than left.  WBC count 25.9.  COVID-negative.  pCO2 105.  Patient was placed on BiPAP and admitted to ICU, stabilized and transferred out of ICU same day. Patient is gradually improving.  Overall poor clinical status.  She lives in an assisted living facility.  Subjective:  Patient seen and examined.  No overnight events.  Patient tells me that she still has a lot of wheezing.  She tells me that she has some choking episodes and coughing after eating. Currently on 6 L of oxygen. Discussed about dysphagia precautions, will also consult speech therapy.  Patient may have to modify her diet. Patient did not use BiPAP last night.  Will monitor without BiPAP to ensure safe transition. Patient had epigastric discomfort after a violent cough improved with pain medication.   Assessment & Plan:   Acute on chronic hypoxemic and hypercarbic respiratory failure secondary to pneumonia, COPD exacerbation: Initially presented with respiratory distress and hypoxemia, increased work of breathing.  Oxygen saturation 70% on 4 L. Continue bronchodilator therapy, steroid taper with prednisone.  breathing exercises.  incentive spirometry, chest physiotherapy and respiratory therapy consult. Patient remains on azithromycin and cefepime.  Will complete total 7 days of antibiotic therapy.  Will change to oral on discharge. Supplemental oxygen to keep saturations more than  90%. Blood cultures, sputum cultures pending so far. Legionella and strep, none collected. Sepsis present on admission.  As above.  Sepsis physiology improving.  On antibiotics.  History of A-fib, hypertension.  Heart rate is controlled.  Sinus rhythm.  She is not on anticoagulation due to fall risk.  History of lung nodules.  Patient does have diagnosed lung nodules.  Unable to biopsy.  Under surveillance from pulmonary office.  She had a PET scan done on 10/25.  She will follow-up with her pulmonology.  Seizure disorder, on Keppra.  Continue.  Hyperlipidemia, on statin.  Continued.  Anxiety depression and chronic paresthesia, on Lexapro and Neurontin.  Goal of care: DNR with limited intervention.  Outpatient palliative care follow-up on discharge.  Discharge to ALF next 24 to 48 hours.  DVT prophylaxis: heparin injection 5,000 Units Start: 11/19/22 1415   Code Status: DNR with limited intervention Family Communication: Patient's daughter Ms. Toniann Fail on the phone. Disposition Plan: Status is: Inpatient Remains inpatient appropriate because: Significant respiratory symptoms.     Consultants:  Pulmonary Palliative  Procedures:  None  Antimicrobials:  Azithromycin and cefepime 11/6---     Objective: Vitals:   11/22/22 0548 11/22/22 0549 11/22/22 0800 11/22/22 0847  BP: (!) 157/89 (!) 157/89    Pulse: 85 78    Resp: 18 18 19    Temp: 97.8 F (36.6 C) 97.8 F (36.6 C)    TempSrc: Oral Oral    SpO2: 97% 97%  92%  Weight:      Height:        Intake/Output Summary (Last 24 hours) at 11/22/2022 1321 Last data filed at 11/22/2022 0500  Gross per 24 hour  Intake 1303.95 ml  Output 1300 ml  Net 3.95 ml   Filed Weights   11/20/22 0500 11/21/22 0500 11/22/22 0500  Weight: 112.7 kg 110 kg 107 kg    Examination:  General: Frail and debilitated.  Mildly anxious.  On 6 L of oxygen.  She does have some tremulousness. Cardiovascular: S1-S2 normal.  Regular rate  rhythm. Respiratory: Some conducted upper airway sounds. Gastrointestinal: Obese and pendulous.  Nontender.  Bowel sound present. Ext: No swelling or edema.  No cyanosis. Neuro: Alert awake and oriented.  No focal neurological deficits.  Generalized weakness.      Data Reviewed: I have personally reviewed following labs and imaging studies  CBC: Recent Labs  Lab 11/19/22 0901 11/19/22 1909 11/20/22 0631  WBC 25.9* 20.3* 18.9*  NEUTROABS 19.9*  --  16.9*  HGB 12.3 10.9* 10.7*  HCT 39.3 34.1* 33.5*  MCV 106.5* 104.6* 103.1*  PLT 316 259 259   Basic Metabolic Panel: Recent Labs  Lab 11/19/22 0901 11/19/22 1714 11/20/22 0315  NA 137  --  136  K 4.7  --  4.2  CL 97*  --  96*  CO2 31  --  30  GLUCOSE 161*  --  164*  BUN 8  --  15  CREATININE 0.44 0.62 0.66  CALCIUM 8.8*  --  8.6*  MG  --   --  1.9  PHOS  --   --  2.5   GFR: Estimated Creatinine Clearance: 65.8 mL/min (by C-G formula based on SCr of 0.66 mg/dL). Liver Function Tests: Recent Labs  Lab 11/19/22 0901  AST 23  ALT 22  ALKPHOS 66  BILITOT 0.5  PROT 8.1  ALBUMIN 3.4*   No results for input(s): "LIPASE", "AMYLASE" in the last 168 hours. No results for input(s): "AMMONIA" in the last 168 hours. Coagulation Profile: Recent Labs  Lab 11/19/22 0901  INR 1.0   Cardiac Enzymes: No results for input(s): "CKTOTAL", "CKMB", "CKMBINDEX", "TROPONINI" in the last 168 hours. BNP (last 3 results) No results for input(s): "PROBNP" in the last 8760 hours. HbA1C: No results for input(s): "HGBA1C" in the last 72 hours. CBG: Recent Labs  Lab 11/21/22 0744 11/21/22 1146 11/21/22 1717 11/21/22 2058 11/22/22 0810  GLUCAP 138* 182* 115* 163* 97   Lipid Profile: No results for input(s): "CHOL", "HDL", "LDLCALC", "TRIG", "CHOLHDL", "LDLDIRECT" in the last 72 hours. Thyroid Function Tests: No results for input(s): "TSH", "T4TOTAL", "FREET4", "T3FREE", "THYROIDAB" in the last 72 hours. Anemia Panel: No  results for input(s): "VITAMINB12", "FOLATE", "FERRITIN", "TIBC", "IRON", "RETICCTPCT" in the last 72 hours. Sepsis Labs: Recent Labs  Lab 11/19/22 0923 11/19/22 1714 11/21/22 0329 11/22/22 0440  PROCALCITON  --  <0.10 <0.10 <0.10  LATICACIDVEN 0.5 1.2  --   --     Recent Results (from the past 240 hour(s))  Blood Culture (routine x 2)     Status: None (Preliminary result)   Collection Time: 11/19/22  9:00 AM   Specimen: BLOOD  Result Value Ref Range Status   Specimen Description   Final    BLOOD SITE NOT SPECIFIED Performed at [facility], [address] [place], [place] [address]    Special Requests   Final    BOTTLES DRAWN AEROBIC AND ANAEROBIC Blood Culture adequate volume Performed at [facility], [address], [place], [place] [address]    Culture   Final    NO GROWTH 3 DAYS Performed at [facility], [address], [place], [place] [address]    Report Status PENDING  Incomplete  SARS Coronavirus 2 by RT PCR (hospital order, performed in [facility] hospital lab) *cepheid single result test* Anterior Nasal Swab     Status: None   Collection Time: 11/19/22  9:01 AM   Specimen: Anterior Nasal Swab  Result Value Ref Range Status   SARS Coronavirus 2 by RT PCR NEGATIVE NEGATIVE Final    Comment: (NOTE) SARS-CoV-2 target nucleic acids are NOT DETECTED.  The SARS-CoV-2 RNA is generally detectable in upper and lower respiratory specimens during the acute phase of infection. The lowest concentration of SARS-CoV-2 viral copies this assay can detect is 250 copies / mL. A negative result does not preclude SARS-CoV-2 infection and should not be used as the sole basis for treatment or other patient management decisions.  A negative result may occur with improper specimen collection / handling, submission of specimen other than nasopharyngeal swab, presence of viral mutation(s) within the areas targeted by this assay, and  inadequate number of viral copies (<250 copies / mL). A negative result must be combined with clinical observations, patient history, and epidemiological information.  Fact Sheet for Patients:   RoadLapTop.co.za  Fact Sheet for Healthcare Providers: http://kim-miller.com/  This test is not yet approved or  cleared by the [place] FDA and has been authorized for detection and/or diagnosis of SARS-CoV-2 by FDA under an Emergency Use Authorization (EUA).  This EUA will remain in effect (meaning this test can be used) for the duration of the COVID-19 declaration under Section 564(b)(1) of the Act, 21 U.S.C. section 360bbb-3(b)(1), unless the authorization is terminated or revoked sooner.  Performed at [facility], [address], [place], [place] [address]   Blood Culture (routine x 2)     Status: None (Preliminary result)   Collection Time: 11/19/22  9:04 AM   Specimen: BLOOD  Result Value Ref Range Status   Specimen Description   Final    BLOOD RIGHT ANTECUBITAL Performed at [facility], [address] [place], [place] [address]    Special Requests   Final    BOTTLES DRAWN AEROBIC AND ANAEROBIC Blood Culture adequate volume Performed at [facility], [address], [place], [place] [address]    Culture   Final    NO GROWTH 3 DAYS Performed at [facility], [address], [place], [place] [address]    Report Status PENDING  Incomplete  MRSA Next Gen by PCR, Nasal     Status: None   Collection Time: 11/19/22 12:46 PM   Specimen: Nasal Mucosa; Nasal Swab  Result Value Ref Range Status   MRSA by PCR Next Gen NOT DETECTED NOT DETECTED Final    Comment: (NOTE) The GeneXpert MRSA Assay (FDA approved for NASAL specimens only), is one component of a comprehensive MRSA colonization surveillance program. It is not intended to diagnose MRSA infection nor to guide or  monitor treatment for MRSA infections. Test performance is not FDA approved in patients less than 22 years old. Performed at [facility], [address] [place], [place] [address]   Culture, blood (Routine X 2) w Reflex to ID Panel     Status: None (Preliminary result)   Collection Time: 11/19/22  5:14 PM   Specimen: BLOOD LEFT ARM  Result Value Ref Range Status   Specimen Description   Final    BLOOD LEFT ARM Performed at [facility], [address], [place], [place] [address]    Special Requests   Final    BOTTLES DRAWN AEROBIC ONLY Blood Culture adequate volume Performed at [facility], [address] [place], [place] [address]    Culture   Final    NO GROWTH 3 DAYS Performed at [facility], [address], [place], [place] [address]    Report Status PENDING  Incomplete  Culture, blood (Routine X 2) w Reflex to ID Panel     Status: None (Preliminary result)   Collection Time: 11/19/22  5:14 PM   Specimen: BLOOD LEFT HAND  Result Value Ref Range Status   Specimen Description   Final    BLOOD LEFT HAND Performed at [facility], [address], [place], [place] [address]    Special Requests   Final    BOTTLES DRAWN AEROBIC AND ANAEROBIC Blood Culture adequate volume Performed at [facility], [address], [place], [place] [address]    Culture   Final    NO GROWTH 3 DAYS Performed at [facility], [address] [place], [place] [address]    Report Status PENDING  Incomplete         Radiology Studies: ECHOCARDIOGRAM COMPLETE  Result Date: 11/21/2022    ECHOCARDIOGRAM REPORT   Patient Name:   MONIQUA WELTZIN Date of Exam: 11/20/2022 Medical Rec #:  [id number]      Height:       64.0 in Accession #:    [id number]     Weight:       248.5 lb Date of Birth:  02-25-1940       BSA:          2.145 m Patient Age:    82 years       BP:           161/109 mmHg Patient Gender: F              HR:            121 bpm. Exam Location:  Inpatient Procedure: 2D Echo, Cardiac Doppler, Color Doppler and Intracardiac            Opacification Agent Indications:    R06.02 SOB  History:        Patient has prior history of Echocardiogram examinations, most                 recent 05/17/2019. CHF, Abnormal ECG, Stroke and COPD,                 Arrythmias:Atrial Fibrillation, Signs/Symptoms:Altered Mental                 Status and Alzheimer's; Risk Factors:Hypertension and                 Dyslipidemia. Cor pulmonale.  Sonographer:    Sheralyn Boatman RDCS Referring Phys: [id number] [facility]  Sonographer Comments: Technically difficult study due to poor echo windows and patient is obese. Image acquisition challenging due to patient body habitus. IMPRESSIONS  1. Left ventricular ejection fraction, by estimation, is 60 to 65%. The left ventricle has normal function. The left ventricle has no regional wall motion abnormalities. There is mild left ventricular hypertrophy. Left ventricular diastolic parameters are indeterminate.  2. Right ventricular systolic function is mildly reduced. The right ventricular size is mildly enlarged. There is mildly elevated pulmonary artery systolic pressure. The estimated right ventricular systolic pressure  is 42.8 mmHg.  3. Left atrial size was moderately dilated.  4. Right atrial size was moderately dilated.  5. The mitral valve is degenerative. Trivial mitral valve regurgitation. Mild mitral stenosis. The mean mitral valve gradient is 6.0 mmHg with average heart rate of 92 bpm.  6. The aortic valve was not well visualized. Aortic valve regurgitation is not visualized. No aortic stenosis is present.  7. The inferior vena cava is dilated in size with >50% respiratory variability, suggesting right atrial pressure of 8 mmHg. FINDINGS  Left Ventricle: Left ventricular ejection fraction, by estimation, is 60 to 65%. The left ventricle has normal function. The left ventricle has no regional wall motion  abnormalities. Definity contrast agent was given IV to delineate the left ventricular  endocardial borders. The left ventricular internal cavity size was normal in size. There is mild left ventricular hypertrophy. Left ventricular diastolic parameters are indeterminate. Right Ventricle: The right ventricular size is mildly enlarged. No increase in right ventricular wall thickness. Right ventricular systolic function is mildly reduced. There is mildly elevated pulmonary artery systolic pressure. The tricuspid regurgitant  velocity is 2.95 m/s, and with an assumed right atrial pressure of 8 mmHg, the estimated right ventricular systolic pressure is 42.8 mmHg. Left Atrium: Left atrial size was moderately dilated. Right Atrium: Right atrial size was moderately dilated. Pericardium: There is no evidence of pericardial effusion. Presence of epicardial fat layer. Mitral Valve: The mitral valve is degenerative in appearance. Mild to moderate mitral annular calcification. Trivial mitral valve regurgitation. Mild mitral valve stenosis. MV peak gradient, 15.1 mmHg. The mean mitral valve gradient is 6.0 mmHg with average heart rate of 92 bpm. Tricuspid Valve: The tricuspid valve is normal in structure. Tricuspid valve regurgitation is mild . No evidence of tricuspid stenosis. Aortic Valve: The aortic valve was not well visualized. Aortic valve regurgitation is not visualized. No aortic stenosis is present. Pulmonic Valve: The pulmonic valve was normal in structure. Pulmonic valve regurgitation is trivial. No evidence of pulmonic stenosis. Aorta: The aortic root is normal in size and structure. Venous: The inferior vena cava is dilated in size with greater than 50% respiratory variability, suggesting right atrial pressure of 8 mmHg. IAS/Shunts: The interatrial septum was not well visualized.  LEFT VENTRICLE PLAX 2D LVIDd:         5.60 cm LVIDs:         4.10 cm LV PW:         1.30 cm LV IVS:        1.10 cm LVOT diam:     2.40 cm  LV SV:         89 LV SV Index:   42 LVOT Area:     4.52 cm  LV Volumes (MOD) LV vol d, MOD A2C: 71.8 ml LV vol d, MOD A4C: 102.0 ml LV vol s, MOD A2C: 18.7 ml LV vol s, MOD A4C: 25.0 ml LV SV MOD A2C:     53.1 ml LV SV MOD A4C:     102.0 ml LV SV MOD BP:      68.2 ml RIGHT VENTRICLE            IVC RV S prime:     8.59 cm/s  IVC diam: 2.30 cm TAPSE (M-mode): 1.4 cm LEFT ATRIUM             Index        RIGHT ATRIUM           Index LA diam:  3.60 cm 1.68 cm/m   RA Area:     18.60 cm LA Vol (A2C):   69.8 ml 32.54 ml/m  RA Volume:   51.40 ml  23.96 ml/m LA Vol (A4C):   83.2 ml 38.78 ml/m LA Biplane Vol: 76.2 ml 35.52 ml/m  AORTIC VALVE LVOT Vmax:   113.00 cm/s LVOT Vmean:  71.300 cm/s LVOT VTI:    0.197 m  AORTA Ao Root diam: 3.60 cm Ao Asc diam:  3.40 cm MITRAL VALVE                TRICUSPID VALVE MV Area (PHT): 3.42 cm     TR Peak grad:   34.8 mmHg MV Area VTI:   2.95 cm     TR Vmax:        295.00 cm/s MV Peak grad:  15.1 mmHg MV Mean grad:  6.0 mmHg     SHUNTS MV Vmax:       1.94 m/s     Systemic VTI:  0.20 m MV Vmean:      115.0 cm/s   Systemic Diam: 2.40 cm MV Decel Time: 222 msec MV E velocity: 139.50 cm/s Weston Brass MD Electronically signed by Weston Brass MD Signature Date/Time: 11/21/2022/12:01:41 AM    Final         Scheduled Meds:  arformoterol  15 mcg Nebulization BID   aspirin EC  81 mg Oral Daily   atorvastatin  20 mg Oral Daily   budesonide (PULMICORT) nebulizer solution  0.5 mg Nebulization BID   captopril  50 mg Oral BID   cholecalciferol  2,000 Units Oral Daily   clonazePAM  1 mg Oral QHS   dextromethorphan-guaiFENesin  1 tablet Oral BID   diltiazem  360 mg Oral Daily   escitalopram  20 mg Oral Daily   ferrous sulfate  324 mg Oral Q breakfast   fluticasone  2 spray Each Nare Daily   gabapentin  300 mg Oral QHS   heparin  5,000 Units Subcutaneous Q8H   insulin aspart  0-5 Units Subcutaneous QHS   insulin aspart  0-9 Units Subcutaneous TID WC   levETIRAcetam   750 mg Oral BID   pantoprazole  40 mg Oral BID   predniSONE  30 mg Oral Q breakfast   revefenacin  175 mcg Nebulization Daily   sodium chloride flush  3 mL Intravenous Q12H   cyanocobalamin  500 mcg Oral Daily   Continuous Infusions:  azithromycin 500 mg (11/22/22 1036)   ceFEPime (MAXIPIME) IV 2 g (11/22/22 1036)     LOS: 3 days    Time spent: 40 minutes    Dorcas Carrow, MD [facility]

## 2022-11-23 DIAGNOSIS — J9622 Acute and chronic respiratory failure with hypercapnia: Secondary | ICD-10-CM | POA: Diagnosis not present

## 2022-11-23 DIAGNOSIS — J9621 Acute and chronic respiratory failure with hypoxia: Secondary | ICD-10-CM | POA: Diagnosis not present

## 2022-11-23 LAB — COMPREHENSIVE METABOLIC PANEL
ALT: 23 U/L (ref 0–44)
AST: 23 U/L (ref 15–41)
Albumin: 2.8 g/dL — ABNORMAL LOW (ref 3.5–5.0)
Alkaline Phosphatase: 49 U/L (ref 38–126)
Anion gap: 9 (ref 5–15)
BUN: 16 mg/dL (ref 8–23)
CO2: 33 mmol/L — ABNORMAL HIGH (ref 22–32)
Calcium: 8.6 mg/dL — ABNORMAL LOW (ref 8.9–10.3)
Chloride: 94 mmol/L — ABNORMAL LOW (ref 98–111)
Creatinine, Ser: 0.6 mg/dL (ref 0.44–1.00)
GFR, Estimated: >60 mL/min (ref >60)
Glucose, Bld: 112 mg/dL — ABNORMAL HIGH (ref 70–99)
Potassium: 3.8 mmol/L (ref 3.5–5.1)
Sodium: 136 mmol/L (ref 135–145)
Total Bilirubin: 0.5 mg/dL (ref <1.2)
Total Protein: 6.8 g/dL (ref 6.5–8.1)

## 2022-11-23 LAB — CBC WITH DIFFERENTIAL/PLATELET
Abs Immature Granulocytes: 2.08 10*3/uL — ABNORMAL HIGH (ref 0.00–0.07)
Basophils Absolute: 0 10*3/uL (ref 0.0–0.1)
Basophils Relative: 0 %
Eosinophils Absolute: 0.1 10*3/uL (ref 0.0–0.5)
Eosinophils Relative: 0 %
HCT: 37.1 % (ref 36.0–46.0)
Hemoglobin: 11.9 g/dL — ABNORMAL LOW (ref 12.0–15.0)
Immature Granulocytes: 9 %
Lymphocytes Relative: 9 %
Lymphs Abs: 2 10*3/uL (ref 0.7–4.0)
MCH: 32.2 pg (ref 26.0–34.0)
MCHC: 32.1 g/dL (ref 30.0–36.0)
MCV: 100.5 fL — ABNORMAL HIGH (ref 80.0–100.0)
Monocytes Absolute: 2 10*3/uL — ABNORMAL HIGH (ref 0.1–1.0)
Monocytes Relative: 9 %
Neutro Abs: 16.9 10*3/uL — ABNORMAL HIGH (ref 1.7–7.7)
Neutrophils Relative %: 73 %
Platelets: 353 10*3/uL (ref 150–400)
RBC: 3.69 MIL/uL — ABNORMAL LOW (ref 3.87–5.11)
RDW: 13.2 % (ref 11.5–15.5)
WBC: 23 10*3/uL — ABNORMAL HIGH (ref 4.0–10.5)
nRBC: 0.1 % (ref 0.0–0.2)

## 2022-11-23 LAB — GLUCOSE, CAPILLARY
Glucose-Capillary: 105 mg/dL — ABNORMAL HIGH (ref 70–99)
Glucose-Capillary: 127 mg/dL — ABNORMAL HIGH (ref 70–99)
Glucose-Capillary: 217 mg/dL — ABNORMAL HIGH (ref 70–99)
Glucose-Capillary: 198 mg/dL — ABNORMAL HIGH (ref 70–99)
Glucose-Capillary: 135 mg/dL — ABNORMAL HIGH (ref 70–99)

## 2022-11-23 LAB — MAGNESIUM: Magnesium: 2.3 mg/dL (ref 1.7–2.4)

## 2022-11-23 LAB — PHOSPHORUS: Phosphorus: 2.9 mg/dL (ref 2.5–4.6)

## 2022-11-23 MED ORDER — DOXYCYCLINE HYCLATE 100 MG PO TABS
100.0000 mg | ORAL_TABLET | Freq: Two times a day (BID) | ORAL | Status: DC
  Administered 2022-11-23 – 2022-11-25 (×4): 100 mg via ORAL
  Filled 2022-11-23 (×4): qty 1

## 2022-11-23 MED ORDER — AZITHROMYCIN 250 MG PO TABS
250.0000 mg | ORAL_TABLET | Freq: Every day | ORAL | Status: DC
  Administered 2022-11-24: 250 mg via ORAL
  Filled 2022-11-23: qty 1

## 2022-11-23 NOTE — Evaluation (Signed)
Clinical/Bedside Swallow Evaluation Patient Details  Name: KANDY STEUBER MRN: [id number] Date of Birth: 1940-11-20  Today's Date: 11/23/2022 Time: SLP Start Time (ACUTE ONLY): 1340 SLP Stop Time (ACUTE ONLY): 1400 SLP Time Calculation (min) (ACUTE ONLY): 20 min  Past Medical History:  Past Medical History:  Diagnosis Date   Afib (HCC)    diagnosed 05/17/19   Arthritis    BRCA2 positive 2017   COPD (chronic obstructive pulmonary disease) (HCC)    Diabetes mellitus without complication (HCC)    Diverticulosis    Dysrhythmia    Afib   Hearing loss    HTN (hypertension)    Memory loss    Pre-diabetes    Seasonal depression (HCC)    Seizure disorder, complex partial, 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HCC) seizure free for one year   Seizures [facility])    anniversary seizure - 1 year after serizure   Stroke (HCC)    05/2019   Stroke, hemorrhagic (HCC) aneurysm bleed.    Vestibulitis of ear    Past Surgical History:  Past Surgical History:  Procedure Laterality Date   broken ankle Right    IR THORACENTESIS ASP PLEURAL SPACE W/IMG GUIDE  06/18/2021   TONSILLECTOMY     TRANSCAROTID ARTERY REVASCULARIZATION  Right 06/20/2019   TRANSCAROTID ARTERY REVASCULARIZATION  Right 06/20/2019   Procedure: RIGHT TRANSCAROTID ARTERY REVASCULARIZATION;  Surgeon: Chuck Hint, MD;  Location: [facility] OR;  Service: Vascular;  Laterality: Right;   HPI:  82 year old with chronic hypoxemic respiratory failure, COPD on 4 L oxygen at baseline, obesity, right lower lobe lung mass presented to the ER with about 2 weeks of cough and shortness of breath.  She was treated outpatient with antibiotics.  Continued to have worsening symptoms.  On arrival in the emergency room she was 70% on room air.  Tachycardic and tachypneic.  Diaphoretic.  Chest x-ray showed airspace disease right more than left.  WBC count 25.9.  COVID-negative.  pCO2 105.  Patient was placed on BiPAP and admitted to ICU, stabilized and transferred out  of ICU same day.  Patient is gradually improving. she lives in an assisted living facility.  Pt was referred by pulmonology for an OP MBS in 2023, which was largely unrevealing. Pt had reported globus with pills primarily.    Assessment / Plan / Recommendation  Clinical Impression  Pt demonstrates no signs of aspiration or dysphagia, presentation is similar to report from [facility] in 2023. Pt continues to report mild globus and utilizes numeroud compensatory strategies to address this including slow rate, small bites, upright posture, taking pills whole in puree, following bites with sips. We discussed how multiple factors can increase the sensation of globus when otherwise swallowing function is adequate. Reinforced pts good habits and attempts to ease pts anxiety about other things she cannot control (access to dental care etc). Will sign off at this time. SLP Visit Diagnosis: Dysphagia, unspecified (R13.10)    Aspiration Risk  Mild aspiration risk    Diet Recommendation Regular;Thin liquid    Liquid Administration via: Cup;Straw Medication Administration: Whole meds with puree Supervision: Patient able to self feed Compensations: Slow rate;Small sips/bites Postural Changes: Seated upright at 90 degrees    Other  Recommendations Oral Care Recommendations: Patient independent with oral care    Recommendations for follow up therapy are one component of a multi-disciplinary discharge planning process, led by the attending physician.  Recommendations may be updated based on patient status, additional functional criteria and insurance authorization.  Follow up Recommendations  No SLP follow up      Assistance Recommended at Discharge    Functional Status Assessment    Frequency and Duration            Prognosis        Swallow Study   General HPI: 82 year old with chronic hypoxemic respiratory failure, COPD on 4 L oxygen at baseline, obesity, right lower lobe lung mass presented to the ER  with about 2 weeks of cough and shortness of breath.  She was treated outpatient with antibiotics.  Continued to have worsening symptoms.  On arrival in the emergency room she was 70% on room air.  Tachycardic and tachypneic.  Diaphoretic.  Chest x-ray showed airspace disease right more than left.  WBC count 25.9.  COVID-negative.  pCO2 105.  Patient was placed on BiPAP and admitted to ICU, stabilized and transferred out of ICU same day.  Patient is gradually improving. she lives in an assisted living facility.  Pt was referred by pulmonology for an OP MBS in 2023, which was largely unrevealing. Pt had reported globus with pills primarily. Type of Study: Bedside Swallow Evaluation Previous Swallow Assessment: see HPI Diet Prior to this Study: Regular;Thin liquids (Level 0) Temperature Spikes Noted: No Respiratory Status: Nasal cannula History of Recent Intubation: No Behavior/Cognition: Alert;Cooperative;Pleasant mood Oral Cavity Assessment: Within Functional Limits Oral Care Completed by SLP: No Oral Cavity - Dentition: Adequate natural dentition Vision: Functional for self-feeding Self-Feeding Abilities: Able to feed self Patient Positioning: Upright in bed Baseline Vocal Quality: Normal Volitional Cough: Strong;Congested Volitional Swallow: Able to elicit    Oral/Motor/Sensory Function Overall Oral Motor/Sensory Function: Within functional limits   Ice Chips     Thin Liquid Thin Liquid: Within functional limits Presentation: Straw    Nectar Thick Nectar Thick Liquid: Not tested   Honey Thick Honey Thick Liquid: Not tested   Puree Puree: Not tested   Solid     Solid: Within functional limits Presentation: Self Fed      Moon Budde, Riley Nearing 11/23/2022,2:06 PM

## 2022-11-23 NOTE — Progress Notes (Signed)
PROGRESS NOTE    Beth [name]  UVO:[id number] DOB: 1940/01/15 DOA: 11/19/2022 PCP: Sigmund Hazel, MD    Brief Narrative:  82 year old with chronic hypoxemic respiratory failure, COPD on 4 L oxygen at baseline, obesity, right lower lobe lung mass presented to the ER with about 2 weeks of cough and shortness of breath.  She was treated outpatient with antibiotics.  Continued to have worsening symptoms.  On arrival in the emergency room she was 70% on room air.  Tachycardic and tachypneic.  Diaphoretic.  Chest x-ray showed airspace disease right more than left.  WBC count 25.9.  COVID-negative.  pCO2 105.  Patient was placed on BiPAP and admitted to ICU, stabilized and transferred out of ICU same day. Patient is gradually improving. she lives in an assisted living facility.  Subjective:  Patient seen and examined.  No overnight events.  Today she feels much better and able to take deep breaths in.  Able to get mucus out.  Occasionally using suction.  Denies any more epigastric or chest pain.   Assessment & Plan:   Acute on chronic hypoxemic and hypercarbic respiratory failure secondary to pneumonia, COPD exacerbation:  Initially presented with respiratory distress and hypoxemia, increased work of breathing.  Oxygen saturation 70% on 4 L. Continue bronchodilator therapy, steroid taper with prednisone.  breathing exercises.  incentive spirometry. Patient remains on azithromycin and cefepime.  Will complete total 7 days of antibiotic therapy.  Will change to oral on discharge. Supplemental oxygen to keep saturations more than 90%. Blood cultures, sputum cultures pending so far. Legionella and strep, none collected. Sepsis present on admission.  As above.  Sepsis physiology improving.  On antibiotics. Clinically improving today.  History of A-fib, hypertension.  Heart rate is controlled.  Sinus rhythm.  She is not on anticoagulation due to fall risk.  History of lung nodules.  Patient does  have diagnosed lung nodules.  Unable to biopsy.  Under surveillance from pulmonary office.  She had a PET scan done on 10/25.  She will follow-up with her pulmonology.  Seizure disorder, on Keppra.  Continue.  Hyperlipidemia, on statin.  Continued.  Anxiety depression and chronic paresthesia, on Lexapro and Neurontin.  Leukocytosis: Likely leukemoid reaction from steroid use.  Will recheck to ensure stabilization.  Goal of care: DNR with limited intervention.  Outpatient palliative care follow-up on discharge.  Discharge to ALF anticipate tomorrow.  DVT prophylaxis: heparin injection 5,000 Units Start: 11/19/22 1415   Code Status: DNR with limited intervention Family Communication: None today. Disposition Plan: Status is: Inpatient Remains inpatient appropriate because: IV antibiotics     Consultants:  Pulmonary Palliative  Procedures:  None  Antimicrobials:  Azithromycin and cefepime 11/6---     Objective: Vitals:   11/22/22 2106 11/23/22 0514 11/23/22 0634 11/23/22 0827  BP: (!) 154/90 (!) 160/76  (!) 160/82  Pulse: 99 87  92  Resp: 20 20  18   Temp: 97.8 F (36.6 C) 98.4 F (36.9 C)  98.1 F (36.7 C)  TempSrc: Oral Oral  Oral  SpO2: 92% 93% 96% 90%  Weight:      Height:        Intake/Output Summary (Last 24 hours) at 11/23/2022 1157 Last data filed at 11/23/2022 1155 Gross per 24 hour  Intake 790 ml  Output 3650 ml  Net -2860 ml   Filed Weights   11/20/22 0500 11/21/22 0500 11/22/22 0500  Weight: 112.7 kg 110 kg 107 kg    Examination:  General: Looks fairly comfortable  today.  Pleasant interaction.  Able to talk in complete sentences. Cardiovascular: S1-S2 normal.  Regular rate rhythm. Respiratory: Bilateral clear.  No added sounds.  Not in any respiratory distress. SpO2: 90 % O2 Flow Rate (L/min): 6 L/min FiO2 (%): 35 %  Gastrointestinal: Soft nontender.  Bowel sound present. Ext: No swelling or edema. Neuro: Alert awake and oriented.   Generalized weakness.  No focal deficits. Skin: Ecchymotic skin.      Data Reviewed: I have personally reviewed following labs and imaging studies  CBC: Recent Labs  Lab 11/19/22 0901 11/19/22 1909 11/20/22 0631 11/23/22 0402  WBC 25.9* 20.3* 18.9* 23.0*  NEUTROABS 19.9*  --  16.9* 16.9*  HGB 12.3 10.9* 10.7* 11.9*  HCT 39.3 34.1* 33.5* 37.1  MCV 106.5* 104.6* 103.1* 100.5*  PLT 316 259 259 353   Basic Metabolic Panel: Recent Labs  Lab 11/19/22 0901 11/19/22 1714 11/20/22 0315 11/23/22 0402  NA 137  --  136 136  K 4.7  --  4.2 3.8  CL 97*  --  96* 94*  CO2 31  --  30 33*  GLUCOSE 161*  --  164* 112*  BUN 8  --  15 16  CREATININE 0.44 0.62 0.66 0.60  CALCIUM 8.8*  --  8.6* 8.6*  MG  --   --  1.9 2.3  PHOS  --   --  2.5 2.9   GFR: Estimated Creatinine Clearance: 65.8 mL/min (by C-G formula based on SCr of 0.6 mg/dL). Liver Function Tests: Recent Labs  Lab 11/19/22 0901 11/23/22 0402  AST 23 23  ALT 22 23  ALKPHOS 66 49  BILITOT 0.5 0.5  PROT 8.1 6.8  ALBUMIN 3.4* 2.8*   No results for input(s): "LIPASE", "AMYLASE" in the last 168 hours. No results for input(s): "AMMONIA" in the last 168 hours. Coagulation Profile: Recent Labs  Lab 11/19/22 0901  INR 1.0   Cardiac Enzymes: No results for input(s): "CKTOTAL", "CKMB", "CKMBINDEX", "TROPONINI" in the last 168 hours. BNP (last 3 results) No results for input(s): "PROBNP" in the last 8760 hours. HbA1C: No results for input(s): "HGBA1C" in the last 72 hours. CBG: Recent Labs  Lab 11/22/22 0810 11/22/22 1347 11/22/22 1733 11/22/22 2103 11/23/22 0823  GLUCAP 97 159* 145* 230* 105*   Lipid Profile: No results for input(s): "CHOL", "HDL", "LDLCALC", "TRIG", "CHOLHDL", "LDLDIRECT" in the last 72 hours. Thyroid Function Tests: No results for input(s): "TSH", "T4TOTAL", "FREET4", "T3FREE", "THYROIDAB" in the last 72 hours. Anemia Panel: No results for input(s): "VITAMINB12", "FOLATE", "FERRITIN",  "TIBC", "IRON", "RETICCTPCT" in the last 72 hours. Sepsis Labs: Recent Labs  Lab 11/19/22 0923 11/19/22 1714 11/21/22 0329 11/22/22 0440  PROCALCITON  --  <0.10 <0.10 <0.10  LATICACIDVEN 0.5 1.2  --   --     Recent Results (from the past 240 hour(s))  Blood Culture (routine x 2)     Status: None (Preliminary result)   Collection Time: 11/19/22  9:00 AM   Specimen: BLOOD  Result Value Ref Range Status   Specimen Description   Final    BLOOD SITE NOT SPECIFIED Performed at [facility], [address], [place], [place] [address]    Special Requests   Final    BOTTLES DRAWN AEROBIC AND ANAEROBIC Blood Culture adequate volume Performed at [facility], [address], [place], [place] [address]    Culture   Final    NO GROWTH 4 DAYS Performed at [facility], [address], [place], [place] [address]    Report Status PENDING  Incomplete  SARS Coronavirus 2 by RT PCR (hospital order, performed in [facility] hospital lab) *cepheid single result test* Anterior Nasal Swab     Status: None   Collection Time: 11/19/22  9:01 AM   Specimen: Anterior Nasal Swab  Result Value Ref Range Status   SARS Coronavirus 2 by RT PCR NEGATIVE NEGATIVE Final    Comment: (NOTE) SARS-CoV-2 target nucleic acids are NOT DETECTED.  The SARS-CoV-2 RNA is generally detectable in upper and lower respiratory specimens during the acute phase of infection. The lowest concentration of SARS-CoV-2 viral copies this assay can detect is 250 copies / mL. A negative result does not preclude SARS-CoV-2 infection and should not be used as the sole basis for treatment or other patient management decisions.  A negative result may occur with improper specimen collection / handling, submission of specimen other than nasopharyngeal swab, presence of viral mutation(s) within the areas targeted by this assay, and inadequate number of viral copies (<250 copies / mL). A negative  result must be combined with clinical observations, patient history, and epidemiological information.  Fact Sheet for Patients:   RoadLapTop.co.za  Fact Sheet for Healthcare Providers: http://kim-miller.com/  This test is not yet approved or  cleared by the [place] FDA and has been authorized for detection and/or diagnosis of SARS-CoV-2 by FDA under an Emergency Use Authorization (EUA).  This EUA will remain in effect (meaning this test can be used) for the duration of the COVID-19 declaration under Section 564(b)(1) of the Act, 21 U.S.C. section 360bbb-3(b)(1), unless the authorization is terminated or revoked sooner.  Performed at [facility], [address] [place], [place] [address]   Blood Culture (routine x 2)     Status: None (Preliminary result)   Collection Time: 11/19/22  9:04 AM   Specimen: BLOOD  Result Value Ref Range Status   Specimen Description   Final    BLOOD RIGHT ANTECUBITAL Performed at [facility], [address] [place], [place] [address]    Special Requests   Final    BOTTLES DRAWN AEROBIC AND ANAEROBIC Blood Culture adequate volume Performed at [facility], [address], [place], [place] [address]    Culture   Final    NO GROWTH 4 DAYS Performed at [facility], [address], [place], [place] [address]    Report Status PENDING  Incomplete  MRSA Next Gen by PCR, Nasal     Status: None   Collection Time: 11/19/22 12:46 PM   Specimen: Nasal Mucosa; Nasal Swab  Result Value Ref Range Status   MRSA by PCR Next Gen NOT DETECTED NOT DETECTED Final    Comment: (NOTE) The GeneXpert MRSA Assay (FDA approved for NASAL specimens only), is one component of a comprehensive MRSA colonization surveillance program. It is not intended to diagnose MRSA infection nor to guide or monitor treatment for MRSA infections. Test performance is not FDA  approved in patients less than 81 years old. Performed at [facility], [address], [place], [place] [address]   Culture, blood (Routine X 2) w Reflex to ID Panel     Status: None (Preliminary result)   Collection Time: 11/19/22  5:14 PM   Specimen: BLOOD LEFT ARM  Result Value Ref Range Status   Specimen Description   Final    BLOOD LEFT ARM Performed at [facility], [address], [place], [place] [address]    Special Requests   Final    BOTTLES DRAWN AEROBIC ONLY Blood Culture adequate volume Performed at [facility], [address], [place], [place] [address]    Culture   Final    NO GROWTH 4 DAYS Performed at [facility], [address], [place], [place] [address]    Report Status PENDING  Incomplete  Culture, blood (Routine X 2) w Reflex to ID Panel     Status: None (Preliminary result)   Collection Time: 11/19/22  5:14 PM   Specimen: BLOOD LEFT HAND  Result Value Ref Range Status   Specimen Description   Final    BLOOD LEFT HAND Performed at [facility], [address], [place], [place] [address]    Special Requests   Final    BOTTLES DRAWN AEROBIC AND ANAEROBIC Blood Culture adequate volume Performed at [facility], [address] [place], [place] [address]    Culture   Final    NO GROWTH 4 DAYS Performed at [facility], [address], [place], [place] [address]    Report Status PENDING  Incomplete         Radiology Studies: No results found.      Scheduled Meds:  arformoterol  15 mcg Nebulization BID   aspirin EC  81 mg Oral Daily   atorvastatin  20 mg Oral Daily   budesonide (PULMICORT) nebulizer solution  0.5 mg Nebulization BID   captopril  50 mg Oral BID   cholecalciferol  2,000 Units Oral Daily   clonazePAM  1 mg Oral QHS   dextromethorphan-guaiFENesin  1 tablet Oral BID   diltiazem  360 mg Oral Daily   escitalopram  20 mg Oral Daily   ferrous  sulfate  324 mg Oral Q breakfast   fluticasone  2 spray Each Nare Daily   gabapentin  300 mg Oral QHS   heparin  5,000 Units Subcutaneous Q8H   insulin aspart  0-5 Units Subcutaneous QHS   insulin aspart  0-9 Units Subcutaneous TID WC   levETIRAcetam  750 mg Oral BID   pantoprazole  40 mg Oral BID   revefenacin  175 mcg Nebulization Daily   sodium chloride flush  3 mL Intravenous Q12H   cyanocobalamin  500 mcg Oral Daily   Continuous Infusions:  azithromycin Stopped (11/23/22 1032)   ceFEPime (MAXIPIME) IV Stopped (11/23/22 1002)     LOS: 4 days    Time spent: 35 minutes    Dorcas Carrow, MD [facility]

## 2022-11-24 DIAGNOSIS — J9622 Acute and chronic respiratory failure with hypercapnia: Secondary | ICD-10-CM | POA: Diagnosis not present

## 2022-11-24 DIAGNOSIS — J9621 Acute and chronic respiratory failure with hypoxia: Secondary | ICD-10-CM | POA: Diagnosis not present

## 2022-11-24 LAB — CBC WITH DIFFERENTIAL/PLATELET
Abs Immature Granulocytes: 3.04 10*3/uL — ABNORMAL HIGH (ref 0.00–0.07)
Basophils Absolute: 0 10*3/uL (ref 0.0–0.1)
Basophils Relative: 0 %
Eosinophils Absolute: 0.2 10*3/uL (ref 0.0–0.5)
Eosinophils Relative: 1 %
HCT: 36.2 % (ref 36.0–46.0)
Hemoglobin: 11.5 g/dL — ABNORMAL LOW (ref 12.0–15.0)
Immature Granulocytes: 12 %
Lymphocytes Relative: 9 %
Lymphs Abs: 2.4 10*3/uL (ref 0.7–4.0)
MCH: 32.3 pg (ref 26.0–34.0)
MCHC: 31.8 g/dL (ref 30.0–36.0)
MCV: 101.7 fL — ABNORMAL HIGH (ref 80.0–100.0)
Monocytes Absolute: 1.7 10*3/uL — ABNORMAL HIGH (ref 0.1–1.0)
Monocytes Relative: 7 %
Neutro Abs: 17.8 10*3/uL — ABNORMAL HIGH (ref 1.7–7.7)
Neutrophils Relative %: 71 %
Platelets: 341 10*3/uL (ref 150–400)
RBC: 3.56 MIL/uL — ABNORMAL LOW (ref 3.87–5.11)
RDW: 13.2 % (ref 11.5–15.5)
WBC: 24.8 10*3/uL — ABNORMAL HIGH (ref 4.0–10.5)
nRBC: 0.2 % (ref 0.0–0.2)

## 2022-11-24 LAB — CULTURE, BLOOD (ROUTINE X 2)
Culture: NO GROWTH
Culture: NO GROWTH
Culture: NO GROWTH
Culture: NO GROWTH
Special Requests: ADEQUATE
Special Requests: ADEQUATE
Special Requests: ADEQUATE
Special Requests: ADEQUATE

## 2022-11-24 LAB — GLUCOSE, CAPILLARY
Glucose-Capillary: 101 mg/dL — ABNORMAL HIGH (ref 70–99)
Glucose-Capillary: 115 mg/dL — ABNORMAL HIGH (ref 70–99)
Glucose-Capillary: 140 mg/dL — ABNORMAL HIGH (ref 70–99)
Glucose-Capillary: 108 mg/dL — ABNORMAL HIGH (ref 70–99)

## 2022-11-24 MED ORDER — ALUM & MAG HYDROXIDE-SIMETH 200-200-20 MG/5ML PO SUSP
15.0000 mL | Freq: Once | ORAL | Status: AC
  Administered 2022-11-24: 15 mL via ORAL
  Filled 2022-11-24: qty 30

## 2022-11-24 MED ORDER — LORATADINE 10 MG PO TABS
10.0000 mg | ORAL_TABLET | Freq: Every day | ORAL | Status: DC
  Administered 2022-11-24 – 2022-11-25 (×2): 10 mg via ORAL
  Filled 2022-11-24 (×2): qty 1

## 2022-11-24 MED ORDER — FLUCONAZOLE 150 MG PO TABS
150.0000 mg | ORAL_TABLET | Freq: Once | ORAL | Status: AC
  Administered 2022-11-24: 150 mg via ORAL
  Filled 2022-11-24: qty 1

## 2022-11-24 NOTE — TOC Progression Note (Signed)
Transition of Care [facility]) - Progression Note    Patient Details  Name: Beth [name] MRN: [id number] Date of Birth: 1940/03/13  [facility] of [facility] CM/SW Contact  Larrie Kass, LCSW Phone Number: 11/24/2022, 9:54 AM  Clinical Narrative:     CSW spoke with tanya at [facility], they are requesting FL2. CSW discussed contracted Hospice agencies. She reports they contact with [facility], [facility] and [facility].    CSW spoke with pt to offer choice for palliative service. Pt has chosen [facility]. Referral was sent out. Pt is requesting EMS transport back to facility. TOC to follow  Expected Discharge Plan: Skilled Nursing Facility Barriers to Discharge: Continued Medical Work up  Expected Discharge Plan and Services   Discharge Planning Services: CM Consult   Living arrangements for the past 2 months: Skilled Nursing Facility                                       Social Determinants of Health (SDOH) Interventions SDOH Screenings   Food Insecurity: No Food Insecurity (11/21/2022)  Housing: Low Risk  (11/21/2022)  Transportation Needs: No Transportation Needs (11/20/2022)  Utilities: Not At Risk (11/20/2022)  Depression (PHQ2-9): Low Risk  (11/21/2019)  Tobacco Use: Medium Risk (11/19/2022)    Readmission Risk Interventions    11/20/2022    1:56 PM  Readmission Risk Prevention Plan  Transportation Screening Complete  PCP or Specialist Appt within 5-7 Days Complete  Home Care Screening Complete  Medication Review (RN CM) Complete

## 2022-11-24 NOTE — Progress Notes (Addendum)
  Daily Progress Note   Patient Name: Beth [name]       Date: 11/24/2022 DOB: 1940-01-17  Age: 82 y.o. MRN#: [id number] Attending Physician: Dorcas Carrow, MD Primary Care Physician: Sigmund Hazel, MD Admit Date: 11/19/2022 Length of Stay: 5 days  EMR reviewed today. Patient clinically improving and may discharge to ALF today. Patient already DNR/DNI with gold form signed and placed on patient's paper chart. Continuing appropraite medical interventions otherwise. Have already placed [facility] referral for palliative follow up as an outpatient at ALF. All goals for medical care currently determine. Please reach out if acute PMT needs arise. Thank you.    Alvester Morin, DO Palliative Care Provider PMT # [phone]

## 2022-11-24 NOTE — Progress Notes (Signed)
Assumed care from off going RN. No changes in initial am assessment. Cont with plan of care.

## 2022-11-24 NOTE — Progress Notes (Signed)
Mobility Specialist - Progress Note  Pre-mobility: 88 bpm HR, 91% SpO2 During mobility: 105 bpm HR, 86% SpO2 Post-mobility: 80 bpm HR, 91% SPO2   11/24/22 1546  Mobility  Activity Stood at bedside  Level of Assistance Contact guard assist, steadying assist  Assistive Device None  Range of Motion/Exercises Active  Activity Response Tolerated fair  Mobility Referral Yes  $Mobility charge 1 Mobility  Mobility Specialist Start Time (ACUTE ONLY) 1525  Mobility Specialist Stop Time (ACUTE ONLY) 1546  Mobility Specialist Time Calculation (min) (ACUTE ONLY) 21 min   Pt was found on recliner chair and agreeable to try ambulation. Once standing stated feeling unsteady and stated not using any device for assistance. Pt able to do x5 STS to build up strength and build pt's confidence. Pt SPO2 would drop to 86-88% with session but able to increase within 1 min or less. At EOS returned to recliner chair with all needs met. Call bell in reach and RN notified of session. Chair alarm on.  Billey Chang Mobility Specialist

## 2022-11-24 NOTE — Progress Notes (Signed)
PROGRESS NOTE    Beth [name]  ZOX:[id number] DOB: 1940/07/08 DOA: 11/19/2022 PCP: Sigmund Hazel, MD    Brief Narrative:  82 year old with chronic hypoxemic respiratory failure, COPD on 4 L oxygen at baseline, obesity, right lower lobe lung mass presented to the ER with about 2 weeks of cough and shortness of breath.  She was treated outpatient with antibiotics.  Continued to have worsening symptoms.  On arrival in the emergency room she was 70% on room air.  Tachycardic and tachypneic.  Diaphoretic.  Chest x-ray showed airspace disease right more than left.  WBC count 25.9.  COVID-negative.  pCO2 105.  Patient was placed on BiPAP and admitted to ICU, stabilized and transferred out of ICU same day. Patient is gradually improving. she lives in an assisted living facility.  Still on significant oxygen.  Subjective:  Patient seen and examined.  Cough and wheezing is improving.  She is still on 6 L of oxygen.  Her concentrator at home can only go up to 5 L.  She also has congestion in her sinuses.  Afebrile.   Assessment & Plan:   Acute on chronic hypoxemic and hypercarbic respiratory failure secondary to pneumonia, COPD exacerbation: Initially presented with respiratory distress and hypoxemia, increased work of breathing.  Oxygen saturation 70% on 4 L. Slow clinical recovery.  Completed steroid therapy.  She was treated with azithromycin and cefepime.  Completed 5 days of azithromycin.  Will change to doxycycline and complete total 7 days of therapy. Supplemental oxygen to keep saturations more than 90%. Blood cultures, sputum cultures negative. Legionella and strep, not collected. Sepsis present on admission.  As above.  Sepsis physiology improving.  On antibiotics. Clinically improving today. Patient needs to further titrate down to 4 to 5 L of oxygen before discharge. She has leukocytosis which is likely secondary to steroids.  History of A-fib, hypertension.  Heart rate is controlled.   Sinus rhythm.  She is not on anticoagulation due to fall risk.  History of lung nodules.  Patient does have diagnosed lung nodules.  Unable to biopsy.  Under surveillance from pulmonary office.  She had a PET scan done on 10/25.  She will follow-up with her pulmonology.  Seizure disorder, on Keppra.  Continue.  Hyperlipidemia, on statin.  Continued.  Anxiety depression and chronic paresthesia, on Lexapro and Neurontin.  Leukocytosis: Likely leukemoid reaction from steroid use.  Will need recheck in 1 week.  Goal of care: DNR with limited intervention.  Outpatient palliative care follow-up on discharge.  Discharge to ALF probably tomorrow if she can be weaned off to 4 to 5 L of oxygen.  If not, she will need higher strength concentrator to deliver more oxygen before discharge.  DVT prophylaxis: heparin injection 5,000 Units Start: 11/19/22 1415   Code Status: DNR with limited intervention Family Communication: None today. Disposition Plan: Status is: Inpatient Remains inpatient appropriate because: On high flow oxygen.     Consultants:  Pulmonary Palliative  Procedures:  None  Antimicrobials:  Azithromycin and cefepime 11/6--- 11/10 Doxycycline 11/10---     Objective: Vitals:   11/24/22 0353 11/24/22 0354 11/24/22 0809 11/24/22 1118  BP:  (!) 168/69    Pulse:  80    Resp:  17    Temp:  97.7 F (36.5 C)    TempSrc:  Oral    SpO2:  98% 94% 96%  Weight: 109.1 kg     Height:        Intake/Output Summary (Last 24 hours) at  11/24/2022 1307 Last data filed at 11/24/2022 0859 Gross per 24 hour  Intake 360 ml  Output 1700 ml  Net -1340 ml   Filed Weights   11/21/22 0500 11/22/22 0500 11/24/22 0353  Weight: 110 kg 107 kg 109.1 kg    Examination:  General: Looks fairly comfortable today.  Occasional dry cough. Cardiovascular: S1-S2 normal.  Regular rate rhythm. Respiratory: No added sounds.  Not in any respiratory distress. SpO2: 96 % O2 Flow Rate (L/min): 5  L/min FiO2 (%): 40 %  Gastrointestinal: Soft nontender.  Bowel sound present. Ext: No swelling or edema. Neuro: Alert awake and oriented.  Generalized weakness.  No focal deficits. Skin: Ecchymotic skin.      Data Reviewed: I have personally reviewed following labs and imaging studies  CBC: Recent Labs  Lab 11/19/22 0901 11/19/22 1909 11/20/22 0631 11/23/22 0402 11/24/22 0414  WBC 25.9* 20.3* 18.9* 23.0* 24.8*  NEUTROABS 19.9*  --  16.9* 16.9* 17.8*  HGB 12.3 10.9* 10.7* 11.9* 11.5*  HCT 39.3 34.1* 33.5* 37.1 36.2  MCV 106.5* 104.6* 103.1* 100.5* 101.7*  PLT 316 259 259 353 341   Basic Metabolic Panel: Recent Labs  Lab 11/19/22 0901 11/19/22 1714 11/20/22 0315 11/23/22 0402  NA 137  --  136 136  K 4.7  --  4.2 3.8  CL 97*  --  96* 94*  CO2 31  --  30 33*  GLUCOSE 161*  --  164* 112*  BUN 8  --  15 16  CREATININE 0.44 0.62 0.66 0.60  CALCIUM 8.8*  --  8.6* 8.6*  MG  --   --  1.9 2.3  PHOS  --   --  2.5 2.9   GFR: Estimated Creatinine Clearance: 66.6 mL/min (by C-G formula based on SCr of 0.6 mg/dL). Liver Function Tests: Recent Labs  Lab 11/19/22 0901 11/23/22 0402  AST 23 23  ALT 22 23  ALKPHOS 66 49  BILITOT 0.5 0.5  PROT 8.1 6.8  ALBUMIN 3.4* 2.8*   No results for input(s): "LIPASE", "AMYLASE" in the last 168 hours. No results for input(s): "AMMONIA" in the last 168 hours. Coagulation Profile: Recent Labs  Lab 11/19/22 0901  INR 1.0   Cardiac Enzymes: No results for input(s): "CKTOTAL", "CKMB", "CKMBINDEX", "TROPONINI" in the last 168 hours. BNP (last 3 results) No results for input(s): "PROBNP" in the last 8760 hours. HbA1C: No results for input(s): "HGBA1C" in the last 72 hours. CBG: Recent Labs  Lab 11/23/22 1658 11/23/22 1910 11/23/22 2120 11/24/22 0806 11/24/22 1152  GLUCAP 217* 198* 135* 101* 115*   Lipid Profile: No results for input(s): "CHOL", "HDL", "LDLCALC", "TRIG", "CHOLHDL", "LDLDIRECT" in the last 72 hours. Thyroid  Function Tests: No results for input(s): "TSH", "T4TOTAL", "FREET4", "T3FREE", "THYROIDAB" in the last 72 hours. Anemia Panel: No results for input(s): "VITAMINB12", "FOLATE", "FERRITIN", "TIBC", "IRON", "RETICCTPCT" in the last 72 hours. Sepsis Labs: Recent Labs  Lab 11/19/22 0923 11/19/22 1714 11/21/22 0329 11/22/22 0440  PROCALCITON  --  <0.10 <0.10 <0.10  LATICACIDVEN 0.5 1.2  --   --     Recent Results (from the past 240 hour(s))  Blood Culture (routine x 2)     Status: None   Collection Time: 11/19/22  9:00 AM   Specimen: BLOOD  Result Value Ref Range Status   Specimen Description   Final    BLOOD SITE NOT SPECIFIED Performed at [facility], [address], [place], [place] [address]    Special Requests  Final    BOTTLES DRAWN AEROBIC AND ANAEROBIC Blood Culture adequate volume Performed at [facility], [address] [place], [place] [address]    Culture   Final    NO GROWTH 5 DAYS Performed at [facility], [address] [place], [place] [address]    Report Status 11/24/2022 FINAL  Final  SARS Coronavirus 2 by RT PCR (hospital order, performed in [facility] hospital lab) *cepheid single result test* Anterior Nasal Swab     Status: None   Collection Time: 11/19/22  9:01 AM   Specimen: Anterior Nasal Swab  Result Value Ref Range Status   SARS Coronavirus 2 by RT PCR NEGATIVE NEGATIVE Final    Comment: (NOTE) SARS-CoV-2 target nucleic acids are NOT DETECTED.  The SARS-CoV-2 RNA is generally detectable in upper and lower respiratory specimens during the acute phase of infection. The lowest concentration of SARS-CoV-2 viral copies this assay can detect is 250 copies / mL. A negative result does not preclude SARS-CoV-2 infection and should not be used as the sole basis for treatment or other patient management decisions.  A negative result may occur with improper specimen collection / handling, submission of specimen  other than nasopharyngeal swab, presence of viral mutation(s) within the areas targeted by this assay, and inadequate number of viral copies (<250 copies / mL). A negative result must be combined with clinical observations, patient history, and epidemiological information.  Fact Sheet for Patients:   RoadLapTop.co.za  Fact Sheet for Healthcare Providers: http://kim-miller.com/  This test is not yet approved or  cleared by the [place] FDA and has been authorized for detection and/or diagnosis of SARS-CoV-2 by FDA under an Emergency Use Authorization (EUA).  This EUA will remain in effect (meaning this test can be used) for the duration of the COVID-19 declaration under Section 564(b)(1) of the Act, 21 U.S.C. section 360bbb-3(b)(1), unless the authorization is terminated or revoked sooner.  Performed at [facility], [address], [place], [place] [address]   Blood Culture (routine x 2)     Status: None   Collection Time: 11/19/22  9:04 AM   Specimen: BLOOD  Result Value Ref Range Status   Specimen Description   Final    BLOOD RIGHT ANTECUBITAL Performed at [facility], [address] [place], [place] [address]    Special Requests   Final    BOTTLES DRAWN AEROBIC AND ANAEROBIC Blood Culture adequate volume Performed at [facility], [address], [place], [place] [address]    Culture   Final    NO GROWTH 5 DAYS Performed at [facility], [address] [place], [place] [address]    Report Status 11/24/2022 FINAL  Final  MRSA Next Gen by PCR, Nasal     Status: None   Collection Time: 11/19/22 12:46 PM   Specimen: Nasal Mucosa; Nasal Swab  Result Value Ref Range Status   MRSA by PCR Next Gen NOT DETECTED NOT DETECTED Final    Comment: (NOTE) The GeneXpert MRSA Assay (FDA approved for NASAL specimens only), is one component of a comprehensive MRSA  colonization surveillance program. It is not intended to diagnose MRSA infection nor to guide or monitor treatment for MRSA infections. Test performance is not FDA approved in patients less than 73 years old. Performed at [facility], [address], [place], [place] [address]   Culture, blood (Routine X 2) w Reflex to ID Panel  Status: None   Collection Time: 11/19/22  5:14 PM   Specimen: BLOOD LEFT ARM  Result Value Ref Range Status   Specimen Description   Final    BLOOD LEFT ARM Performed at [facility], [address], [place], [place] [address]    Special Requests   Final    BOTTLES DRAWN AEROBIC ONLY Blood Culture adequate volume Performed at [facility], [address], [place], [place] [address]    Culture   Final    NO GROWTH 5 DAYS Performed at [facility], [address] [place], [place] [address]    Report Status 11/24/2022 FINAL  Final  Culture, blood (Routine X 2) w Reflex to ID Panel     Status: None   Collection Time: 11/19/22  5:14 PM   Specimen: BLOOD LEFT HAND  Result Value Ref Range Status   Specimen Description   Final    BLOOD LEFT HAND Performed at [facility], [address], [place], [place] [address]    Special Requests   Final    BOTTLES DRAWN AEROBIC AND ANAEROBIC Blood Culture adequate volume Performed at [facility], [address] [place], [place] [address]    Culture   Final    NO GROWTH 5 DAYS Performed at [facility], [address] [place], [place] [address]    Report Status 11/24/2022 FINAL  Final         Radiology Studies: No results found.      Scheduled Meds:  arformoterol  15 mcg Nebulization BID   aspirin EC  81 mg Oral Daily   atorvastatin  20 mg Oral Daily   budesonide (PULMICORT) nebulizer solution  0.5 mg Nebulization BID   captopril  50 mg Oral BID   cholecalciferol  2,000 Units Oral Daily   clonazePAM  1 mg Oral QHS    dextromethorphan-guaiFENesin  1 tablet Oral BID   diltiazem  360 mg Oral Daily   doxycycline  100 mg Oral Q12H   escitalopram  20 mg Oral Daily   ferrous sulfate  324 mg Oral Q breakfast   fluconazole  150 mg Oral Once   fluticasone  2 spray Each Nare Daily   gabapentin  300 mg Oral QHS   heparin  5,000 Units Subcutaneous Q8H   insulin aspart  0-5 Units Subcutaneous QHS   insulin aspart  0-9 Units Subcutaneous TID WC   levETIRAcetam  750 mg Oral BID   loratadine  10 mg Oral Daily   pantoprazole  40 mg Oral BID   revefenacin  175 mcg Nebulization Daily   sodium chloride flush  3 mL Intravenous Q12H   cyanocobalamin  500 mcg Oral Daily   Continuous Infusions:     LOS: 5 days    Time spent: 35 minutes    Dorcas Carrow, MD [facility]

## 2022-11-24 NOTE — Progress Notes (Signed)
   11/24/22 1118  Oxygen Therapy/Pulse Ox  O2 Device (S)  Nasal Cannula (Changed to a regular Elbe @ 5 L, Sp02 96%.)  O2 Therapy Oxygen  O2 Flow Rate (L/min) 5 L/min  FiO2 (%) 40 %  SpO2 96 %  Safety Instructions Yes (Comment)

## 2022-11-25 DIAGNOSIS — J9621 Acute and chronic respiratory failure with hypoxia: Secondary | ICD-10-CM | POA: Diagnosis not present

## 2022-11-25 DIAGNOSIS — J9622 Acute and chronic respiratory failure with hypercapnia: Secondary | ICD-10-CM | POA: Diagnosis not present

## 2022-11-25 LAB — GLUCOSE, CAPILLARY
Glucose-Capillary: 119 mg/dL — ABNORMAL HIGH (ref 70–99)
Glucose-Capillary: 166 mg/dL — ABNORMAL HIGH (ref 70–99)

## 2022-11-25 MED ORDER — LORATADINE 10 MG PO TABS: 10.0000 mg | ORAL_TABLET | Freq: Every day | ORAL | 0 refills | Status: AC

## 2022-11-25 NOTE — Discharge Summary (Signed)
Physician Discharge Summary  AMYLIA TITSWORTH [name]:[id number] DOB: 1940/01/14 DOA: 11/19/2022  PCP: Sigmund Hazel, MD  Admit date: 11/19/2022 Discharge date: 11/25/2022  Admitted From: Assisted living facility Disposition: Assisted living facility  Recommendations for Outpatient Follow-up:  Follow up with PCP in 1-2 weeks Please obtain BMP/CBC in one week Palliative care follow-up  Home Health: PT/OT Equipment/Devices: Oxygen already available at home  Discharge Condition: Fair CODE STATUS: DNR with limited intervention Diet recommendation: Regular diet, nutritional supplements  Discharge summary: 82 year old with chronic hypoxemic respiratory failure, COPD on 4 L oxygen at baseline, obesity, right lower lobe lung mass presented to the ER with about 2 weeks of cough and shortness of breath.  She was treated outpatient with antibiotics.  Continued to have worsening symptoms.  On arrival in the emergency room she was 70% on room air.  Tachycardic and tachypneic.  Diaphoretic.  Chest x-ray showed airspace disease right more than left.  WBC count 25.9.  COVID-negative.  pCO2 105.  Patient was placed on BiPAP and admitted to ICU, stabilized and transferred out of ICU same day. Patient is gradually improving. she lives in an assisted living facility.  Received 7 days of steroids and antibiotics.  Oxygen requirement is back to baseline.  Remains overall in poor clinical status, however stable today to discharge home with outpatient follow-up.  Assessment & Plan:   Acute on chronic hypoxemic and hypercarbic respiratory failure secondary to pneumonia, COPD exacerbation:Initially presented with respiratory distress and hypoxemia, increased work of breathing.  Oxygen saturation 70% on 4 L. Slow clinical recovery.  Completed steroid therapy.  She was treated with different antibiotics and completing 7 days of therapy today.   Some clinical improvement but her recovery will be slow.   Cultures were  negative.   Sepsis physiology improved.   Back to her 4 to 5 L of oxygen.   Leukocytosis likely secondary to steroids, will need to recheck in 1 week to ensure stabilization.   Patient will go home on albuterol as needed, Advair and Yupelri. Most of the antiallergy medications makes her sleepy.  She did tolerate Claritin.  She will be prescribed Claritin.  She is also using Flonase.   History of A-fib, hypertension.  Heart rate is controlled.  Sinus rhythm.  She is not on anticoagulation due to fall risk.   History of lung nodules.  Patient does have diagnosed lung nodules.  Unable to biopsy.  Under surveillance from pulmonary office.  She had a PET scan done on 10/25.  She will follow-up with her pulmonology.   Seizure disorder, on Keppra.  Continue.   Hyperlipidemia, on statin.  Continued.   Anxiety depression and chronic paresthesia, on Lexapro and Neurontin.   Leukocytosis: Likely leukemoid reaction from steroid use.  Will need recheck in 1 week.   Goal of care: DNR with limited intervention.  Outpatient palliative care follow-up on discharge.  Discharge to ALF today.   Fairly stable to discharge.  High risk of readmission.  Discharge Diagnoses:  Principal Problem:   Acute on chronic respiratory failure (HCC) Active Problems:   Right lower lobe lung mass   Need for emotional support   DNR (do not resuscitate)   Goals of care, counseling/discussion    Discharge Instructions  Discharge Instructions     Call MD for:  difficulty breathing, headache or visual disturbances   Complete by: As directed    Diet general   Complete by: As directed    Increase activity slowly   Complete  by: As directed       Allergies as of 11/25/2022       Reactions   Prozac [fluoxetine Hcl] Other (See Comments)   Seizures   Sulfa Antibiotics Nausea And Vomiting, Other (See Comments)   TINGLING IN LEGS ALSO   Micardis [telmisartan] Swelling, Other (See Comments)   Swelling, bruising    Prednisone Other (See Comments)   Mood changes   Allegra [fexofenadine] Other (See Comments)   Makes her sleep 24 hours    Augmentin [amoxicillin-pot Clavulanate] Rash   Avelox [moxifloxacin Hcl In Nacl] Rash   LEVAQUIN IS OK-SEE NOTE   Lisinopril-hydrochlorothiazide Other (See Comments)   Unknown reaction   Norvasc [amlodipine Besylate] Other (See Comments)   Fatigue        Medication List     STOP taking these medications    cyanocobalamin 500 MCG tablet Commonly known as: VITAMIN B12   nystatin cream Commonly known as: MYCOSTATIN   potassium chloride 10 MEQ tablet Commonly known as: KLOR-CON M       TAKE these medications    acetaminophen 325 MG tablet Commonly known as: TYLENOL Take 1-2 tablets (325-650 mg total) by mouth every 4 (four) hours as needed for mild pain. What changed: how much to take   albuterol 108 (90 Base) MCG/ACT inhaler Commonly known as: VENTOLIN HFA Inhale 1 puff into the lungs every 4 (four) hours as needed for shortness of breath. What changed: Another medication with the same name was removed. Continue taking this medication, and follow the directions you see here.   aspirin EC 81 MG tablet Take 81 mg by mouth daily.   atorvastatin 20 MG tablet Commonly known as: LIPITOR TAKE 1 TABLET DAILY What changed: when to take this   azelastine 0.1 % nasal spray Commonly known as: ASTELIN Place 1 spray into both nostrils 2 (two) times daily. Use in each nostril as directed What changed:  when to take this additional instructions   captopril 50 MG tablet Commonly known as: CAPOTEN Take 50 mg by mouth in the morning and at bedtime.   clonazePAM 1 MG tablet Commonly known as: KLONOPIN Take 1 mg by mouth at bedtime. What changed: Another medication with the same name was removed. Continue taking this medication, and follow the directions you see here.   diltiazem 360 MG 24 hr capsule Commonly known as: CARDIZEM CD TAKE 1 CAPSULE  DAILY What changed:  how much to take how to take this when to take this additional instructions   fluticasone 50 MCG/ACT nasal spray Commonly known as: FLONASE Place 1 spray into both nostrils in the morning. Instill 1 spray into each nostril in the morning and an additional 1 spray into each nostril once a day as needed for nasal congestion   fluticasone-salmeterol 230-21 MCG/ACT inhaler Commonly known as: ADVAIR HFA Inhale 2 puffs into the lungs 2 (two) times daily.   gabapentin 600 MG tablet Commonly known as: NEURONTIN TAKE ONE-HALF (1/2) TABLET AT BEDTIME What changed:  how much to take how to take this when to take this additional instructions   guaiFENesin 600 MG 12 hr tablet Commonly known as: MUCINEX Take 1,200 mg by mouth every 12 (twelve) hours.   HAIR SKIN & NAILS PO Take 2 capsules by mouth in the morning.   levETIRAcetam 750 MG tablet Commonly known as: KEPPRA Take 1 tablet (750 mg total) by mouth 2 (two) times daily. What changed: when to take this   Lexapro 20 MG tablet  Generic drug: escitalopram Take 1 tablet (20 mg total) by mouth daily. Morning dosage What changed:  when to take this additional instructions   loratadine 10 MG tablet Commonly known as: CLARITIN Take 1 tablet (10 mg total) by mouth daily.   METAMUCIL FIBER SINGLES PO Take 1 packet by mouth daily as needed (for constipation- to be mixed into 8 ounces of water).   metoprolol tartrate 25 MG tablet Commonly known as: LOPRESSOR Take 25 mg by mouth 2 (two) times daily as needed (for a pulse greater than 100).   MICONAZOLE NITRATE EX Apply 1 application  topically See admin instructions. Apply liberally to irritated areas of the abdominal folds 2 times a day as needed for rashes   OXYGEN Inhale 4 L/min into the lungs continuous.   pantoprazole 40 MG tablet Commonly known as: PROTONIX Take 40 mg by mouth in the morning and at bedtime.   polyethylene glycol powder 17 GM/SCOOP  powder Commonly known as: GLYCOLAX/MIRALAX Take 17 g by mouth daily as needed for mild constipation (to be mixed into 8 ounces of water).   PROBIOTIC GUMMIES PO Take 1 tablet by mouth See admin instructions. Probiotic 6 billion chewable- Chew 1 gummie by mouth in the morning   SIMPLY SALINE NA Place 1 spray into both nostrils as needed (for nasal dryness).   sodium chloride HYPERTONIC 3 % nebulizer solution Take by nebulization in the morning and at bedtime. What changed:  how much to take when to take this additional instructions   Vitamin D3 50 MCG (2000 UT) capsule Take 2,000 Units by mouth daily.   Yupelri 175 MCG/3ML nebulizer solution Generic drug: revefenacin INHALE CONTENTS OF 1 VIAL (3 ML, 175 MCG) VIA NEBULIZER DAILY What changed: See the new instructions.        Allergies  Allergen Reactions   Prozac [Fluoxetine Hcl] Other (See Comments)    Seizures   Sulfa Antibiotics Nausea And Vomiting and Other (See Comments)    TINGLING IN LEGS ALSO   Micardis [Telmisartan] Swelling and Other (See Comments)    Swelling, bruising    Prednisone Other (See Comments)    Mood changes    Allegra [Fexofenadine] Other (See Comments)    Makes her sleep 24 hours    Augmentin [Amoxicillin-Pot Clavulanate] Rash   Avelox [Moxifloxacin Hcl In Nacl] Rash    LEVAQUIN IS OK-SEE NOTE   Lisinopril-Hydrochlorothiazide Other (See Comments)    Unknown reaction   Norvasc [Amlodipine Besylate] Other (See Comments)    Fatigue    Consultations: Pulmonary critical care Palliative care   Procedures/Studies: NM PET Image Initial (PI) Skull Base To Thigh  Result Date: 11/23/2022 CLINICAL DATA:  Initial treatment strategy for right lower lobe nodule. EXAM: NUCLEAR MEDICINE PET SKULL BASE TO THIGH TECHNIQUE: 11.9 mCi F-18 FDG was injected intravenously. Full-ring PET imaging was performed from the skull base to thigh after the radiotracer. CT data was obtained and used for attenuation  correction and anatomic localization. Fasting blood glucose: 118 mg/dl COMPARISON:  CT chest dated 01/28/2022.  PET-CT dated 03/05/2021. FINDINGS: Mediastinal blood pool activity: SUV max 3.0 Liver activity: SUV max NA NECK: No hypermetabolic lymph nodes in the neck. Incidental CT findings: None. CHEST: 2.3 cm lobulated right lower lobe nodule (series 7/image 43), max SUV 4.3. Additional small bilateral pulmonary nodules are non FDG avid. 6 mm short axis right paratracheal node (series 4/image 62), partially calcified, max SUV 2.7, likely reactive. Incidental CT findings: Moderate centrilobular and paraseptal emphysematous changes, upper lung  predominant. Atherosclerotic calcifications of the aortic arch. Cardiomegaly. Moderate three-vessel coronary atherosclerosis. ABDOMEN/PELVIS: No abnormal hypermetabolic activity within the liver, pancreas, adrenal glands, or spleen. No hypermetabolic lymph nodes in the abdomen or pelvis. Focal hypermetabolism in the second portion of the duodenum, max SUV 9.6, although without CT correlate. Incidental CT findings: Large left renal cyst. Suspected layering small gallstones (series 4/image 135). Sigmoid diverticulosis, without evidence of diverticulitis. Atherosclerotic calcifications of the abdominal aorta and branch vessels. SKELETON: No focal hypermetabolic activity to suggest skeletal metastasis. Incidental CT findings: Degenerative changes of the visualized thoracolumbar spine. IMPRESSION: 2.3 cm lobulated right lower lobe nodule, suspicious for primary bronchogenic carcinoma. No findings suspicious for metastatic disease. Focal hypermetabolism in the second portion of the duodenum, although without CT correlate. This is favored to be reactive. However, consider upper endoscopy for further evaluation as clinically warranted. Electronically Signed   By: Charline Bills M.D.   On: 11/23/2022 23:54   ECHOCARDIOGRAM COMPLETE  Result Date: 11/21/2022    ECHOCARDIOGRAM  REPORT   Patient Name:   Beth [name] Date of Exam: 11/20/2022 Medical Rec #:  [id number]      Height:       64.0 in Accession #:    [id number]     Weight:       248.5 lb Date of Birth:  02-05-1940       BSA:          2.145 m Patient Age:    82 years       BP:           161/109 mmHg Patient Gender: F              HR:           121 bpm. Exam Location:  Inpatient Procedure: 2D Echo, Cardiac Doppler, Color Doppler and Intracardiac            Opacification Agent Indications:    R06.02 SOB  History:        Patient has prior history of Echocardiogram examinations, most                 recent 05/17/2019. CHF, Abnormal ECG, Stroke and COPD,                 Arrythmias:Atrial Fibrillation, Signs/Symptoms:Altered Mental                 Status and Alzheimer's; Risk Factors:Hypertension and                 Dyslipidemia. Cor pulmonale.  Sonographer:    Sheralyn Boatman RDCS Referring Phys: [id number] [facility]  Sonographer Comments: Technically difficult study due to poor echo windows and patient is obese. Image acquisition challenging due to patient body habitus. IMPRESSIONS  1. Left ventricular ejection fraction, by estimation, is 60 to 65%. The left ventricle has normal function. The left ventricle has no regional wall motion abnormalities. There is mild left ventricular hypertrophy. Left ventricular diastolic parameters are indeterminate.  2. Right ventricular systolic function is mildly reduced. The right ventricular size is mildly enlarged. There is mildly elevated pulmonary artery systolic pressure. The estimated right ventricular systolic pressure is 42.8 mmHg.  3. Left atrial size was moderately dilated.  4. Right atrial size was moderately dilated.  5. The mitral valve is degenerative. Trivial mitral valve regurgitation. Mild mitral stenosis. The mean mitral valve gradient is 6.0 mmHg with average heart rate of 92 bpm.  6. The aortic valve was not well visualized.  Aortic valve regurgitation is not visualized. No aortic stenosis  is present.  7. The inferior vena cava is dilated in size with >50% respiratory variability, suggesting right atrial pressure of 8 mmHg. FINDINGS  Left Ventricle: Left ventricular ejection fraction, by estimation, is 60 to 65%. The left ventricle has normal function. The left ventricle has no regional wall motion abnormalities. Definity contrast agent was given IV to delineate the left ventricular  endocardial borders. The left ventricular internal cavity size was normal in size. There is mild left ventricular hypertrophy. Left ventricular diastolic parameters are indeterminate. Right Ventricle: The right ventricular size is mildly enlarged. No increase in right ventricular wall thickness. Right ventricular systolic function is mildly reduced. There is mildly elevated pulmonary artery systolic pressure. The tricuspid regurgitant  velocity is 2.95 m/s, and with an assumed right atrial pressure of 8 mmHg, the estimated right ventricular systolic pressure is 42.8 mmHg. Left Atrium: Left atrial size was moderately dilated. Right Atrium: Right atrial size was moderately dilated. Pericardium: There is no evidence of pericardial effusion. Presence of epicardial fat layer. Mitral Valve: The mitral valve is degenerative in appearance. Mild to moderate mitral annular calcification. Trivial mitral valve regurgitation. Mild mitral valve stenosis. MV peak gradient, 15.1 mmHg. The mean mitral valve gradient is 6.0 mmHg with average heart rate of 92 bpm. Tricuspid Valve: The tricuspid valve is normal in structure. Tricuspid valve regurgitation is mild . No evidence of tricuspid stenosis. Aortic Valve: The aortic valve was not well visualized. Aortic valve regurgitation is not visualized. No aortic stenosis is present. Pulmonic Valve: The pulmonic valve was normal in structure. Pulmonic valve regurgitation is trivial. No evidence of pulmonic stenosis. Aorta: The aortic root is normal in size and structure. Venous: The inferior vena  cava is dilated in size with greater than 50% respiratory variability, suggesting right atrial pressure of 8 mmHg. IAS/Shunts: The interatrial septum was not well visualized.  LEFT VENTRICLE PLAX 2D LVIDd:         5.60 cm LVIDs:         4.10 cm LV PW:         1.30 cm LV IVS:        1.10 cm LVOT diam:     2.40 cm LV SV:         89 LV SV Index:   42 LVOT Area:     4.52 cm  LV Volumes (MOD) LV vol d, MOD A2C: 71.8 ml LV vol d, MOD A4C: 102.0 ml LV vol s, MOD A2C: 18.7 ml LV vol s, MOD A4C: 25.0 ml LV SV MOD A2C:     53.1 ml LV SV MOD A4C:     102.0 ml LV SV MOD BP:      68.2 ml RIGHT VENTRICLE            IVC RV S prime:     8.59 cm/s  IVC diam: 2.30 cm TAPSE (M-mode): 1.4 cm LEFT ATRIUM             Index        RIGHT ATRIUM           Index LA diam:        3.60 cm 1.68 cm/m   RA Area:     18.60 cm LA Vol (A2C):   69.8 ml 32.54 ml/m  RA Volume:   51.40 ml  23.96 ml/m LA Vol (A4C):   83.2 ml 38.78 ml/m LA Biplane Vol: 76.2 ml 35.52 ml/m  AORTIC VALVE LVOT Vmax:   113.00 cm/s LVOT Vmean:  71.300 cm/s LVOT VTI:    0.197 m  AORTA Ao Root diam: 3.60 cm Ao Asc diam:  3.40 cm MITRAL VALVE                TRICUSPID VALVE MV Area (PHT): 3.42 cm     TR Peak grad:   34.8 mmHg MV Area VTI:   2.95 cm     TR Vmax:        295.00 cm/s MV Peak grad:  15.1 mmHg MV Mean grad:  6.0 mmHg     SHUNTS MV Vmax:       1.94 m/s     Systemic VTI:  0.20 m MV Vmean:      115.0 cm/s   Systemic Diam: 2.40 cm MV Decel Time: 222 msec MV E velocity: 139.50 cm/s Weston Brass MD Electronically signed by Weston Brass MD Signature Date/Time: 11/21/2022/12:01:41 AM    Final    DG Chest Port 1 View  Result Date: 11/19/2022 CLINICAL DATA:  Questionable sepsis.  Shortness of breath. EXAM: PORTABLE CHEST 1 VIEW COMPARISON:  02/03/2022; 11/25/2021; PET-CT-11/08/2022; chest CT-01/28/2022 FINDINGS: Unchanged cardiac silhouette and mediastinal contours with atherosclerotic plaque within the aortic arch. Potential developing airspace opacity within the  peripheral aspect of the right mid lung. Known right lower lung pulmonary nodules not well demonstrated on this AP portable examination. Unchanged punctate left upper lung granuloma. Trace bilateral effusions are not excluded. No pneumothorax. No evidence of edema. No acute osseous abnormalities. IMPRESSION: 1. Potential developing airspace opacity within the peripheral aspect of the right mid lung, potentially atelectasis though worrisome for developing infection. 2. Known right lower lung pulmonary nodules not well demonstrated on this AP portable examination. Electronically Signed   By: Simonne Come M.D.   On: 11/19/2022 10:57   (Echo, Carotid, EGD, Colonoscopy, ERCP)    Subjective: Patient seen and examined.  She has some congestion in her nose and throat.  Denies any chest pain or shortness of breath.  Pain associated with cough has improved.  On 4 L of oxygen.  Comfortable with plan to go back to assisted living facility today.   Discharge Exam: Vitals:   11/25/22 0437 11/25/22 0732  BP: (!) 140/78   Pulse: 66   Resp: 16   Temp: 97.7 F (36.5 C)   SpO2: 93% 97%   Vitals:   11/24/22 2010 11/24/22 2039 11/25/22 0437 11/25/22 0732  BP: (!) 142/75  (!) 140/78   Pulse: 78  66   Resp: 14  16   Temp: 98.3 F (36.8 C)  97.7 F (36.5 C)   TempSrc: Oral  Oral   SpO2: 96% 93% 93% 97%  Weight:      Height:        General: Pt is alert, awake, not in acute distress Chronically sick looking and debilitated.  Not in any distress. Pleasant to conversation. Ecchymosis of the skin on her hands. Cardiovascular: RRR, S1/S2 +, no rubs, no gallops Respiratory: CTA bilaterally, no wheezing, no rhonchi.  Some conducted upper airway sounds. Abdominal: Soft, NT, ND, bowel sounds + Extremities: no edema, no cyanosis    The results of significant diagnostics from this hospitalization (including imaging, microbiology, ancillary and laboratory) are listed below for reference.      Microbiology: Recent Results (from the past 240 hour(s))  Blood Culture (routine x 2)     Status: None   Collection Time: 11/19/22  9:00 AM  Specimen: BLOOD  Result Value Ref Range Status   Specimen Description   Final    BLOOD SITE NOT SPECIFIED Performed at [facility], [address] [place], [place] [address]    Special Requests   Final    BOTTLES DRAWN AEROBIC AND ANAEROBIC Blood Culture adequate volume Performed at [facility], [address] [place], [place] [address]    Culture   Final    NO GROWTH 5 DAYS Performed at [facility], [address], [place], [place] [address]    Report Status 11/24/2022 FINAL  Final  SARS Coronavirus 2 by RT PCR (hospital order, performed in [facility] hospital lab) *cepheid single result test* Anterior Nasal Swab     Status: None   Collection Time: 11/19/22  9:01 AM   Specimen: Anterior Nasal Swab  Result Value Ref Range Status   SARS Coronavirus 2 by RT PCR NEGATIVE NEGATIVE Final    Comment: (NOTE) SARS-CoV-2 target nucleic acids are NOT DETECTED.  The SARS-CoV-2 RNA is generally detectable in upper and lower respiratory specimens during the acute phase of infection. The lowest concentration of SARS-CoV-2 viral copies this assay can detect is 250 copies / mL. A negative result does not preclude SARS-CoV-2 infection and should not be used as the sole basis for treatment or other patient management decisions.  A negative result may occur with improper specimen collection / handling, submission of specimen other than nasopharyngeal swab, presence of viral mutation(s) within the areas targeted by this assay, and inadequate number of viral copies (<250 copies / mL). A negative result must be combined with clinical observations, patient history, and epidemiological information.  Fact Sheet for Patients:   RoadLapTop.co.za  Fact Sheet for Healthcare  Providers: http://kim-miller.com/  This test is not yet approved or  cleared by the [place] FDA and has been authorized for detection and/or diagnosis of SARS-CoV-2 by FDA under an Emergency Use Authorization (EUA).  This EUA will remain in effect (meaning this test can be used) for the duration of the COVID-19 declaration under Section 564(b)(1) of the Act, 21 U.S.C. section 360bbb-3(b)(1), unless the authorization is terminated or revoked sooner.  Performed at [facility], [address] [place], [place] [address]   Blood Culture (routine x 2)     Status: None   Collection Time: 11/19/22  9:04 AM   Specimen: BLOOD  Result Value Ref Range Status   Specimen Description   Final    BLOOD RIGHT ANTECUBITAL Performed at [facility], [address], [place], [place] [address]    Special Requests   Final    BOTTLES DRAWN AEROBIC AND ANAEROBIC Blood Culture adequate volume Performed at [facility], [address] [place], [place] [address]    Culture   Final    NO GROWTH 5 DAYS Performed at [facility], [address], [place], [place] [address]    Report Status 11/24/2022 FINAL  Final  MRSA Next Gen by PCR, Nasal     Status: None   Collection Time: 11/19/22 12:46 PM   Specimen: Nasal Mucosa; Nasal Swab  Result Value Ref Range Status   MRSA by PCR Next Gen NOT DETECTED NOT DETECTED Final    Comment: (NOTE) The GeneXpert MRSA Assay (FDA approved for NASAL specimens only), is one component of a comprehensive MRSA colonization surveillance program. It is not intended to diagnose MRSA infection nor to guide or monitor treatment for  MRSA infections. Test performance is not FDA approved in patients less than 10 years old. Performed at [facility], [address] [place], [place] [address]   Culture, blood (Routine X 2) w Reflex to ID Panel     Status: None   Collection  Time: 11/19/22  5:14 PM   Specimen: BLOOD LEFT ARM  Result Value Ref Range Status   Specimen Description   Final    BLOOD LEFT ARM Performed at [facility], [address] [place], [place] [address]    Special Requests   Final    BOTTLES DRAWN AEROBIC ONLY Blood Culture adequate volume Performed at [facility], [address], [place], [place] [address]    Culture   Final    NO GROWTH 5 DAYS Performed at [facility], [address], [place], [place] [address]    Report Status 11/24/2022 FINAL  Final  Culture, blood (Routine X 2) w Reflex to ID Panel     Status: None   Collection Time: 11/19/22  5:14 PM   Specimen: BLOOD LEFT HAND  Result Value Ref Range Status   Specimen Description   Final    BLOOD LEFT HAND Performed at [facility] [facility], [address], [place], [place] [address]    Special Requests   Final    BOTTLES DRAWN AEROBIC AND ANAEROBIC Blood Culture adequate volume Performed at [facility], [address], [place], [place] [address]    Culture   Final    NO GROWTH 5 DAYS Performed at [facility], [address] [place], [place] [address]    Report Status 11/24/2022 FINAL  Final     Labs: BNP (last 3 results) Recent Labs    11/19/22 0901 11/20/22 0315  BNP 191.9* 167.2*   Basic Metabolic Panel: Recent Labs  Lab 11/19/22 0901 11/19/22 1714 11/20/22 0315 11/23/22 0402  NA 137  --  136 136  K 4.7  --  4.2 3.8  CL 97*  --  96* 94*  CO2 31  --  30 33*  GLUCOSE 161*  --  164* 112*  BUN 8  --  15 16  CREATININE 0.44 0.62 0.66 0.60  CALCIUM 8.8*  --  8.6* 8.6*  MG  --   --  1.9 2.3  PHOS  --   --  2.5 2.9   Liver Function Tests: Recent Labs  Lab 11/19/22 0901 11/23/22 0402  AST 23 23  ALT 22 23  ALKPHOS 66 49  BILITOT 0.5 0.5  PROT 8.1 6.8  ALBUMIN 3.4* 2.8*   No results for input(s): "LIPASE", "AMYLASE" in the last 168 hours. No results for input(s): "AMMONIA" in the last 168  hours. CBC: Recent Labs  Lab 11/19/22 0901 11/19/22 1909 11/20/22 0631 11/23/22 0402 11/24/22 0414  WBC 25.9* 20.3* 18.9* 23.0* 24.8*  NEUTROABS 19.9*  --  16.9* 16.9* 17.8*  HGB 12.3 10.9* 10.7* 11.9* 11.5*  HCT 39.3 34.1* 33.5* 37.1 36.2  MCV 106.5* 104.6* 103.1* 100.5* 101.7*  PLT 316 259 259 353 341   Cardiac Enzymes: No results for input(s): "CKTOTAL", "CKMB", "CKMBINDEX", "TROPONINI" in the last 168 hours. BNP: Invalid input(s): "POCBNP" CBG: Recent Labs  Lab 11/24/22 0806 11/24/22 1152 11/24/22 1645 11/24/22 2015 11/25/22 0738  GLUCAP 101* 115* 140* 108* 119*   D-Dimer No results for input(s): "DDIMER" in the last 72 hours. Hgb A1c No results for input(s): "HGBA1C" in the last  72 hours. Lipid Profile No results for input(s): "CHOL", "HDL", "LDLCALC", "TRIG", "CHOLHDL", "LDLDIRECT" in the last 72 hours. Thyroid function studies No results for input(s): "TSH", "T4TOTAL", "T3FREE", "THYROIDAB" in the last 72 hours.  Invalid input(s): "FREET3" Anemia work up No results for input(s): "VITAMINB12", "FOLATE", "FERRITIN", "TIBC", "IRON", "RETICCTPCT" in the last 72 hours. Urinalysis    Component Value Date/Time   COLORURINE YELLOW 11/25/2021 1642   APPEARANCEUR CLEAR 11/25/2021 1642   LABSPEC 1.011 11/25/2021 1642   PHURINE 5.0 11/25/2021 1642   GLUCOSEU NEGATIVE 11/25/2021 1642   HGBUR SMALL (A) 11/25/2021 1642   BILIRUBINUR NEGATIVE 11/25/2021 1642   KETONESUR NEGATIVE 11/25/2021 1642   PROTEINUR NEGATIVE 11/25/2021 1642   UROBILINOGEN 0.2 08/18/2014 1742   NITRITE NEGATIVE 11/25/2021 1642   LEUKOCYTESUR NEGATIVE 11/25/2021 1642   Sepsis Labs Recent Labs  Lab 11/19/22 1909 11/20/22 0631 11/23/22 0402 11/24/22 0414  WBC 20.3* 18.9* 23.0* 24.8*   Microbiology Recent Results (from the past 240 hour(s))  Blood Culture (routine x 2)     Status: None   Collection Time: 11/19/22  9:00 AM   Specimen: BLOOD  Result Value Ref Range Status   Specimen  Description   Final    BLOOD SITE NOT SPECIFIED Performed at [facility], [address] [place], [place] [address]    Special Requests   Final    BOTTLES DRAWN AEROBIC AND ANAEROBIC Blood Culture adequate volume Performed at [facility], [address], [place], [place] [address]    Culture   Final    NO GROWTH 5 DAYS Performed at [facility], [address] [place], [place] [address]    Report Status 11/24/2022 FINAL  Final  SARS Coronavirus 2 by RT PCR (hospital order, performed in [facility] hospital lab) *cepheid single result test* Anterior Nasal Swab     Status: None   Collection Time: 11/19/22  9:01 AM   Specimen: Anterior Nasal Swab  Result Value Ref Range Status   SARS Coronavirus 2 by RT PCR NEGATIVE NEGATIVE Final    Comment: (NOTE) SARS-CoV-2 target nucleic acids are NOT DETECTED.  The SARS-CoV-2 RNA is generally detectable in upper and lower respiratory specimens during the acute phase of infection. The lowest concentration of SARS-CoV-2 viral copies this assay can detect is 250 copies / mL. A negative result does not preclude SARS-CoV-2 infection and should not be used as the sole basis for treatment or other patient management decisions.  A negative result may occur with improper specimen collection / handling, submission of specimen other than nasopharyngeal swab, presence of viral mutation(s) within the areas targeted by this assay, and inadequate number of viral copies (<250 copies / mL). A negative result must be combined with clinical observations, patient history, and epidemiological information.  Fact Sheet for Patients:   RoadLapTop.co.za  Fact Sheet for Healthcare Providers: http://kim-miller.com/  This test is not yet approved or  cleared by the [place] FDA and has been authorized for detection and/or diagnosis of SARS-CoV-2 by FDA under an Emergency  Use Authorization (EUA).  This EUA will remain in effect (meaning this test can be used) for the duration of the COVID-19 declaration under Section 564(b)(1) of the Act, 21 U.S.C. section 360bbb-3(b)(1), unless the authorization is terminated or revoked sooner.  Performed at [facility], [address] [place], [place] [address]   Blood Culture (routine x 2)     Status: None   Collection Time: 11/19/22  9:04 AM   Specimen: BLOOD  Result Value Ref Range Status   Specimen Description   Final    BLOOD RIGHT ANTECUBITAL Performed at [facility], [address], [place], [place] [address]    Special Requests   Final    BOTTLES DRAWN AEROBIC AND ANAEROBIC Blood Culture adequate volume Performed at [facility], [address], [place], [place] [address]    Culture   Final    NO GROWTH 5 DAYS Performed at [facility], [address], [place], [place] [address]    Report Status 11/24/2022 FINAL  Final  MRSA Next Gen by PCR, Nasal     Status: None   Collection Time: 11/19/22 12:46 PM   Specimen: Nasal Mucosa; Nasal Swab  Result Value Ref Range Status   MRSA by PCR Next Gen NOT DETECTED NOT DETECTED Final    Comment: (NOTE) The GeneXpert MRSA Assay (FDA approved for NASAL specimens only), is one component of a comprehensive MRSA colonization surveillance program. It is not intended to diagnose MRSA infection nor to guide or monitor treatment for MRSA infections. Test performance is not FDA approved in patients less than 44 years old. Performed at [facility], [address] [place], [place] [address]   Culture, blood (Routine X 2) w Reflex to ID Panel     Status: None   Collection Time: 11/19/22  5:14 PM   Specimen: BLOOD LEFT ARM  Result Value Ref Range Status   Specimen Description   Final    BLOOD LEFT ARM Performed at [facility], [address], [place], [place] [address]     Special Requests   Final    BOTTLES DRAWN AEROBIC ONLY Blood Culture adequate volume Performed at [facility], [address] [place], [place] [address]    Culture   Final    NO GROWTH 5 DAYS Performed at [facility], [address] [place], [place] [address]    Report Status 11/24/2022 FINAL  Final  Culture, blood (Routine X 2) w Reflex to ID Panel     Status: None   Collection Time: 11/19/22  5:14 PM   Specimen: BLOOD LEFT HAND  Result Value Ref Range Status   Specimen Description   Final    BLOOD LEFT HAND Performed at [facility], [address] [place], [place] [address]    Special Requests   Final    BOTTLES DRAWN AEROBIC AND ANAEROBIC Blood Culture adequate volume Performed at [facility], [address], [place], [place] [address]    Culture   Final    NO GROWTH 5 DAYS Performed at [facility], [address], [place], [place] [address]    Report Status 11/24/2022 FINAL  Final     Time coordinating discharge: 40 minutes  SIGNED:   Dorcas Carrow, MD  [facility] 11/25/2022, 10:54 AM

## 2022-11-25 NOTE — [place] FL2 (Signed)
[place] MEDICAID FL2 LEVEL OF CARE FORM     IDENTIFICATION  Patient Name: Beth [name] Birthdate: Sep 27, 1940 Sex: female Admission Date (Current Location): 11/19/2022  [facility] and [place] Number:  Producer, television/film/video and Address:  [facility],  [address] [place], [place] [address]      Provider Number: [id number]  Attending Physician Name and Address:  Dorcas Carrow, MD  Relative Name and Phone Number:  Thomasena Edis (POA),Wendy (Daughter)  [phone] (Mobile)    Current Level of Care: Hospital Recommended Level of Care: Assisted Living Facility Prior Approval Number:    Date Approved/Denied:   PASRR Number:    Discharge Plan: Other (Comment) (ALF)    Current Diagnoses: Patient Active Problem List   Diagnosis Date Noted   Right lower lobe lung mass 11/20/2022   Need for emotional support 11/20/2022   DNR (do not resuscitate) 11/20/2022   Goals of care, counseling/discussion 11/20/2022   Acute on chronic respiratory failure (HCC) 11/19/2022   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, with anxiety (HCC) 05/28/2022   UTI (urinary tract infection) 11/28/2021   Anemia 11/28/2021   Acute rhinosinusitis 11/28/2021   Cor pulmonale (chronic) (HCC) 11/06/2021   Globus sensation 08/08/2021   Palliative care encounter 07/15/2021   Caregiver burden 07/15/2021   Pleural effusion 07/08/2021   Bronchiectasis without complication (HCC) 12/28/2020   Pulmonary nodule 1 cm or greater in diameter 12/28/2020   Dysuria 12/18/2020   Leukocytosis 12/18/2020   Leg weakness 10/30/2020   Vascular dementia with behavior disturbance (HCC) 09/06/2020   Permanent atrial fibrillation with rapid ventricular response (HCC) 09/06/2020   Breast cancer screening, high risk patient 10/25/2019   Paroxysmal atrial fibrillation (HCC) 07/11/2019   Carotid artery stenosis 06/20/2019   Stroke (HCC) 06/20/2019   Degenerative arthritis of left foot 06/14/2019   Left ankle  sprain 06/14/2019   Acute right arterial ischemic stroke, MCA (middle cerebral artery) (HCC) 05/24/2019   Stenosis of right carotid artery    Atrial fibrillation with RVR (HCC) 05/18/2019   Seizures (HCC)    HLD (hyperlipidemia)    Macrocytosis    Acute ischemic right MCA stroke (HCC)    Depression    Allergic rhinitis 05/02/2019   At high risk for breast cancer 12/09/2017   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   Obesity, morbid, BMI 40.0-49.9 (HCC) 04/30/2016   Chronic respiratory failure (HCC) 12/11/2015   COPD with acute exacerbation (HCC) 10/08/2015   Genetic testing 08/26/2015   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   Family history of ovarian cancer 07/26/2015   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   Benign paroxysmal positional vertigo 04/06/2014   Tonic-clonic seizure syndrome (HCC) 04/06/2014   Pulsatile tinnitus of right ear 04/06/2014   Other forms of epilepsy and recurrent seizures without mention of intractable epilepsy 04/21/2013   SAH (subarachnoid hemorrhage) (HCC) 04/21/2013   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   Seizure disorder, complex partial, 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HCC)    Stroke, hemorrhagic (HCC)    HTN (hypertension)    Vestibulitis of ear     Orientation RESPIRATION BLADDER Height & Weight     Self, Time, Situation, Place  O2 (4.5) Continent Weight: 240 lb 8.4 oz (109.1 kg) Height:  5\' 4"  (162.6 cm)  BEHAVIORAL SYMPTOMS/MOOD NEUROLOGICAL BOWEL NUTRITION STATUS      Continent Diet (regular)  AMBULATORY STATUS COMMUNICATION OF NEEDS Skin   Independent (Mod independent) Verbally Normal  Personal Care Assistance Level of Assistance  Bathing, Feeding, Dressing Bathing Assistance: Limited assistance Feeding assistance: Independent Dressing Assistance: Limited assistance     Functional Limitations Info  Hearing, Sight, Speech Sight Info: Adequate Hearing Info: Adequate Speech Info: Adequate     SPECIAL CARE FACTORS FREQUENCY                       Contractures Contractures Info: Not present    Additional Factors Info  Code Status, Allergies Code Status Info: DNR Allergies Info: Prozac (Fluoxetine Hcl)  Sulfa Antibiotics  Micardis (Telmisartan)  Prednisone  Allegra (Fexofenadine)  Augmentin (Amoxicillin-pot Clavulanate)  Avelox (Moxifloxacin Hcl In Nacl)  Lisinopril-hydrochlorothiazide  Norvasc (Amlodipine Besylate)           Current Medications (11/25/2022):  This is the current hospital active medication list Current Facility-Administered Medications  Medication Dose Route Frequency Provider Last Rate Last Admin   acetaminophen (TYLENOL) tablet 325-650 mg  325-650 mg Oral Q4H PRN Simonne Martinet, NP   650 mg at 11/24/22 2244   albuterol (PROVENTIL) (2.5 MG/3ML) 0.083% nebulizer solution 2.5 mg  2.5 mg Nebulization Q6H PRN Dorcas Carrow, MD   2.5 mg at 11/23/22 1945   arformoterol (BROVANA) nebulizer solution 15 mcg  15 mcg Nebulization BID Simonne Martinet, NP   15 mcg at 11/25/22 0731   aspirin EC tablet 81 mg  81 mg Oral Daily Simonne Martinet, NP   81 mg at 11/24/22 0847   atorvastatin (LIPITOR) tablet 20 mg  20 mg Oral Daily Simonne Martinet, NP   20 mg at 11/24/22 0846   budesonide (PULMICORT) nebulizer solution 0.5 mg  0.5 mg Nebulization BID Simonne Martinet, NP   0.5 mg at 11/25/22 0731   captopril (CAPOTEN) tablet 50 mg  50 mg Oral BID Simonne Martinet, NP   50 mg at 11/24/22 2155   cholecalciferol (VITAMIN D3) 25 MCG (1000 UNIT) tablet 2,000 Units  2,000 Units Oral Daily Simonne Martinet, NP   2,000 Units at 11/24/22 0846   clonazePAM (KLONOPIN) tablet 1 mg  1 mg Oral QHS Dorcas Carrow, MD   1 mg at 11/24/22 2156   dextromethorphan-guaiFENesin (MUCINEX DM) 30-600 MG per 12 hr tablet 1 tablet  1 tablet Oral BID Dorcas Carrow, MD   1 tablet at 11/24/22 2156   diltiazem (CARDIZEM CD) 24 hr capsule 360 mg  360 mg Oral Daily Simonne Martinet, NP   360 mg at  11/24/22 0845   docusate sodium (COLACE) capsule 100 mg  100 mg Oral BID PRN Simonne Martinet, NP   100 mg at 11/23/22 2127   doxycycline (VIBRA-TABS) tablet 100 mg  100 mg Oral Q12H Dorcas Carrow, MD   100 mg at 11/24/22 2156   escitalopram (LEXAPRO) tablet 20 mg  20 mg Oral Daily Simonne Martinet, NP   20 mg at 11/24/22 0846   ferrous sulfate tablet 324 mg  324 mg Oral Q breakfast Simonne Martinet, NP   324 mg at 11/24/22 0847   fluticasone (FLONASE) 50 MCG/ACT nasal spray 2 spray  2 spray Each Nare Daily Simonne Martinet, NP   2 spray at 11/24/22 0851   gabapentin (NEURONTIN) capsule 300 mg  300 mg Oral QHS Simonne Martinet, NP   300 mg at 11/24/22 2156   guaiFENesin (ROBITUSSIN) 100 MG/5ML liquid 10 mL  10 mL Oral Q6H PRN Dorcas Carrow, MD       heparin injection  5,000 Units  5,000 Units Subcutaneous Q8H Simonne Martinet, NP   5,000 Units at 11/25/22 0543   hydrALAZINE (APRESOLINE) injection 10 mg  10 mg Intravenous Q6H PRN Paliwal, Aditya, MD       insulin aspart (novoLOG) injection 0-5 Units  0-5 Units Subcutaneous QHS Dorcas Carrow, MD   2 Units at 11/22/22 2257   insulin aspart (novoLOG) injection 0-9 Units  0-9 Units Subcutaneous TID WC Dorcas Carrow, MD   1 Units at 11/24/22 1815   labetalol (NORMODYNE) injection 10 mg  10 mg Intravenous Q2H PRN Paliwal, Eliezer Lofts, MD   10 mg at 11/20/22 1830   levETIRAcetam (KEPPRA) tablet 750 mg  750 mg Oral BID Simonne Martinet, NP   750 mg at 11/24/22 2156   loratadine (CLARITIN) tablet 10 mg  10 mg Oral Daily Dorcas Carrow, MD   10 mg at 11/24/22 1312   morphine (PF) 2 MG/ML injection 2 mg  2 mg Intravenous Q4H PRN Dorcas Carrow, MD   2 mg at 11/22/22 1408   pantoprazole (PROTONIX) EC tablet 40 mg  40 mg Oral BID Simonne Martinet, NP   40 mg at 11/24/22 2156   polyethylene glycol (MIRALAX / GLYCOLAX) packet 17 g  17 g Oral Daily PRN Simonne Martinet, NP       revefenacin (YUPELRI) nebulizer solution 175 mcg  175 mcg Nebulization Daily Simonne Martinet, NP   175 mcg at 11/25/22 0731   sodium chloride flush (NS) 0.9 % injection 3 mL  3 mL Intravenous Q12H Dorcas Carrow, MD   3 mL at 11/23/22 0935   vitamin B-12 (CYANOCOBALAMIN) tablet 500 mcg  500 mcg Oral Daily Simonne Martinet, NP   500 mcg at 11/24/22 8657     Discharge Medications: Please see discharge summary for a list of discharge medications.  Relevant Imaging Results:  Relevant Lab Results:   Additional Information SSN:[id number]  Valentina Shaggy Caidyn Blossom, LCSW

## 2022-11-25 NOTE — TOC Transition Note (Signed)
Transition of Care [facility]) - CM/SW Discharge Note   Patient Details  Name: Beth [name] MRN: [id number] Date of Birth: 06-Jul-1940  [facility] CM/SW Contact:  Larrie Kass, LCSW Phone Number: 11/25/2022, 10:47 AM   Clinical Narrative:    Pt to d/c back to [facility], CSW spoke with pt's daughter Toniann Fail, to provide update.Toniann Fail reported [facility] informed the pt and family last year that they can not offer OP palliative services. Toniann Fail has chosen hospice of [facility]. CSW made referral with Cheri, who will follow up.   Pt's daughter is requesting EMS transport back to facility. CSW has emailed FL2 and d/c summary to facility. RN to call report to [phone]  [facility] called, no further TOC needs , TOC sign off.     Final next level of care: Assisted Living Barriers to Discharge: No Barriers Identified   Patient Goals and CMS Choice CMS Medicare.gov Compare Post Acute Care list provided to:: Patient    Discharge Placement                  Patient to be transferred to facility by: EMS Name of family member notified: Thomasena Edis (POA),Wendy (Daughter)  [phone] (Mobile) Patient and family notified of of transfer: 11/25/22  Discharge Plan and Services Additional resources added to the After Visit Summary for     Discharge Planning Services: CM Consult                                 Social Determinants of Health (SDOH) Interventions SDOH Screenings   Food Insecurity: No Food Insecurity (11/21/2022)  Housing: Low Risk  (11/21/2022)  Transportation Needs: No Transportation Needs (11/20/2022)  Utilities: Not At Risk (11/20/2022)  Depression (PHQ2-9): Low Risk  (11/21/2019)  Tobacco Use: Medium Risk (11/19/2022)     Readmission Risk Interventions    11/20/2022    1:56 PM  Readmission Risk Prevention Plan  Transportation Screening Complete  PCP or Specialist Appt within 5-7 Days Complete  Home Care Screening Complete  Medication Review (RN  CM) Complete

## 2022-11-25 NOTE — Plan of Care (Signed)
  Problem: Clinical Measurements: Goal: Will remain free from infection Outcome: Progressing Goal: Diagnostic test results will improve Outcome: Progressing Goal: Respiratory complications will improve Outcome: Progressing Goal: Cardiovascular complication will be avoided Outcome: Progressing   Problem: Activity: Goal: Risk for activity intolerance will decrease Outcome: Progressing   Problem: Safety: Goal: Ability to remain free from injury will improve Outcome: Progressing   

## 2022-11-27 DIAGNOSIS — I4891 Unspecified atrial fibrillation: Secondary | ICD-10-CM | POA: Diagnosis not present

## 2022-11-27 DIAGNOSIS — J189 Pneumonia, unspecified organism: Secondary | ICD-10-CM | POA: Diagnosis not present

## 2022-11-27 DIAGNOSIS — J9621 Acute and chronic respiratory failure with hypoxia: Secondary | ICD-10-CM | POA: Diagnosis not present

## 2022-11-27 DIAGNOSIS — J441 Chronic obstructive pulmonary disease with (acute) exacerbation: Secondary | ICD-10-CM | POA: Diagnosis not present

## 2022-11-27 DIAGNOSIS — I10 Essential (primary) hypertension: Secondary | ICD-10-CM | POA: Diagnosis not present

## 2022-11-27 DIAGNOSIS — Z09 Encounter for follow-up examination after completed treatment for conditions other than malignant neoplasm: Secondary | ICD-10-CM | POA: Diagnosis not present

## 2022-11-27 NOTE — Telephone Encounter (Signed)
Patient scheduled to see you on 12/12 [facility] requested she be seen next week. Will the 12th be sufficient?

## 2022-12-01 DIAGNOSIS — R279 Unspecified lack of coordination: Secondary | ICD-10-CM | POA: Diagnosis not present

## 2022-12-01 DIAGNOSIS — M6281 Muscle weakness (generalized): Secondary | ICD-10-CM | POA: Diagnosis not present

## 2022-12-01 DIAGNOSIS — R2681 Unsteadiness on feet: Secondary | ICD-10-CM | POA: Diagnosis not present

## 2022-12-01 NOTE — Telephone Encounter (Signed)
Spoke with Beth [name]. Patient is scheduled for 11:45 on 11/21. Nothing further needed.

## 2022-12-04 ENCOUNTER — Encounter (HOSPITAL_BASED_OUTPATIENT_CLINIC_OR_DEPARTMENT_OTHER): Payer: Self-pay | Admitting: Pulmonary Disease

## 2022-12-04 ENCOUNTER — Ambulatory Visit (HOSPITAL_BASED_OUTPATIENT_CLINIC_OR_DEPARTMENT_OTHER): Payer: Medicare Other

## 2022-12-04 ENCOUNTER — Ambulatory Visit (INDEPENDENT_AMBULATORY_CARE_PROVIDER_SITE_OTHER): Payer: Medicare Other | Admitting: Pulmonary Disease

## 2022-12-04 ENCOUNTER — Telehealth (HOSPITAL_BASED_OUTPATIENT_CLINIC_OR_DEPARTMENT_OTHER): Payer: Self-pay | Admitting: Pulmonary Disease

## 2022-12-04 VITALS — BP 130/74 | HR 71 | Ht 64.0 in | Wt 240.0 lb

## 2022-12-04 DIAGNOSIS — J441 Chronic obstructive pulmonary disease with (acute) exacerbation: Secondary | ICD-10-CM | POA: Diagnosis not present

## 2022-12-04 DIAGNOSIS — J9612 Chronic respiratory failure with hypercapnia: Secondary | ICD-10-CM

## 2022-12-04 DIAGNOSIS — J9611 Chronic respiratory failure with hypoxia: Secondary | ICD-10-CM | POA: Diagnosis not present

## 2022-12-04 DIAGNOSIS — R911 Solitary pulmonary nodule: Secondary | ICD-10-CM | POA: Diagnosis not present

## 2022-12-04 DIAGNOSIS — R2681 Unsteadiness on feet: Secondary | ICD-10-CM | POA: Diagnosis not present

## 2022-12-04 DIAGNOSIS — M6281 Muscle weakness (generalized): Secondary | ICD-10-CM | POA: Diagnosis not present

## 2022-12-04 DIAGNOSIS — R279 Unspecified lack of coordination: Secondary | ICD-10-CM | POA: Diagnosis not present

## 2022-12-04 DIAGNOSIS — J9811 Atelectasis: Secondary | ICD-10-CM | POA: Diagnosis not present

## 2022-12-04 NOTE — Assessment & Plan Note (Signed)
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  I once again offered her SBRT.  There are certainly some risk of scarring and worsening breathing after this and she was not willing to accept that risk. We have decided to take a palliative approach

## 2022-12-04 NOTE — Assessment & Plan Note (Addendum)
-  With hypoxia and hypercarbia. She will continue on oxygen, tanks are being provided now by [employer]. She would qualify for NIV but does not want to pursue.  We had a discussion about use of BiPAP in someone who is DNR. She would certainly not want mechanical ventilation

## 2022-12-04 NOTE — Assessment & Plan Note (Signed)
She will continue on Advair and use albuterol for rescue

## 2022-12-04 NOTE — Patient Instructions (Signed)
X CXR to follow up on pneumonia

## 2022-12-04 NOTE — Telephone Encounter (Signed)
Chest x-ray shows improved pneumonia, do not feel she needs more antibiotics

## 2022-12-04 NOTE — Progress Notes (Signed)
Subjective:    Patient ID: Beth [name], female    DOB: 1941-01-13, 82 y.o.   MRN: [id number]  HPI  82 yo former smoker for FU of COPD & chronic respiratory failure  -Enlarging right lower lobe nodule since 2020   PMH :  HTN, organic brain syndrome, stroke, BRCA2 positive, Obesity. 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  Subsequent seizures. Has some word finding issues and cognitive deficits with emotional lability   -BRCA positive after her daughter was diagnosed with breast cancer     She was treated 02/2021 for right lower lobe consolidation. She developed left lower lobe consolidation 05/2021 and received another round of Levaquin , then developed a small effusion. 06/18/21 150 cc of thoracentesis>> lymphocytic exudate, cytology negative     10/2021 OV > Bipedal edema - could be r/t cardizem or cor pulmonale due to severe hypoxia.  lasix x 10 ds    ED visit  11/2021 COPD exacerbation with small effusions   OV 11/27/21 Rx for  acute sinusitis and UTIwith empiric Levaquin, and prednisone   46-month follow-up visit She was hospitalized 11/2022 for pneumonia and acute respiratory failure requiring transient BiPAP.  VBG was 7.1 7/105/82 DNR orders were issued.  She recovered and now resides at skilled nursing facility with her husband.  She had some issues getting oxygen tanks from [employer].  At the facility she goes down to the dining area and uses more oxygen tanks but after her daughter's complaints these are being delivered by DME/[employer]. Breathing seems to have improved since discharge but she reports some residual cough with green sputum production.  She is followed by outpatient palliative care. We reviewed PET scan results today   Significant tests/ events reviewed  PET scan 10/2022 hypermetabolic 2.3 cm right lower lobe nodule CT chest without contrast 01/2022 -slight enlargement of  right lower lobe nodule 15 x 17 mm, left effusion has resolved with mild scarring     CT chest without con 07/2021 unchanged right lower lobe 1.4 cm nodule,, small left effusion, left lower lobe nodule not noted, left lower lobe consolidation resolved, evidence of old calcified granulomas   CT chest 05/2021 right lower lobe consolidation overall, right lower lobe nodule unchanged, left lower lobe consolidation/small effusion, left lower lobe nodule not noted   02/2021 PET >> right lower lobe consolidation , right lower lobe nodule not visualized, left lower lobe 1.1 cm nodule hypermetabolic, mediastinal lymph nodes hypermetabolic 02/2021 HRCT was obtained on 02/20/2021 which showed that right lower lobe consolidation had resolved with minimal scarring, no ILD was noted, there was 1 enlarging 1.4 x 1.2 cm nodule.       12/2018 CT chest Scattered pulmonary nodules are seen in the right lower lobe, the largest measuring up to 1.1 cm in diameter.      CT angio chest 07/2015 >> no pulmonary embolism, no emphysema, calcified mediastinal LNs PFTs 09/2015>> severe airway obstruction with FEV1 40%, improves to 47% with albuterol, DLCO decreased to 45%  Review of Systems neg for any significant sore throat, dysphagia, itching, sneezing, nasal congestion or excess/ purulent secretions, fever, chills, sweats, unintended wt loss, pleuritic or exertional cp, hempoptysis, orthopnea pnd or change in chronic leg swelling. Also denies presyncope, palpitations, heartburn, abdominal pain, nausea, vomiting, diarrhea or change in bowel or urinary habits, dysuria,hematuria, rash, arthralgias, visual complaints, headache, numbness weakness or ataxia.  Objective:   Physical Exam  Gen. Pleasant, obese, in no distress ENT - no lesions, no post nasal drip Neck: No JVD, no thyromegaly, no carotid bruits Lungs: no use of accessory muscles, no dullness to percussion, decreased without rales or rhonchi  Cardiovascular:  Rhythm regular, heart sounds  normal, no murmurs or gallops, no peripheral edema Musculoskeletal: No deformities, no cyanosis or clubbing , no tremors       Assessment & Plan:    Community-acquired pneumonia -we repeated chest x-ray today which shows improvement in right lower lobe infiltrate.  As such I do not feel like she needs more antibiotics

## 2022-12-05 NOTE — Telephone Encounter (Signed)
Called and spoke with Toniann Fail on [facility]  informed pt's  results.  She stated that she understood result with any concerns

## 2022-12-07 DIAGNOSIS — M6281 Muscle weakness (generalized): Secondary | ICD-10-CM | POA: Diagnosis not present

## 2022-12-07 DIAGNOSIS — R2681 Unsteadiness on feet: Secondary | ICD-10-CM | POA: Diagnosis not present

## 2022-12-07 DIAGNOSIS — R279 Unspecified lack of coordination: Secondary | ICD-10-CM | POA: Diagnosis not present

## 2022-12-08 DIAGNOSIS — Z23 Encounter for immunization: Secondary | ICD-10-CM | POA: Diagnosis not present

## 2022-12-08 DIAGNOSIS — R2681 Unsteadiness on feet: Secondary | ICD-10-CM | POA: Diagnosis not present

## 2022-12-08 DIAGNOSIS — M6281 Muscle weakness (generalized): Secondary | ICD-10-CM | POA: Diagnosis not present

## 2022-12-08 DIAGNOSIS — R279 Unspecified lack of coordination: Secondary | ICD-10-CM | POA: Diagnosis not present

## 2022-12-10 DIAGNOSIS — D649 Anemia, unspecified: Secondary | ICD-10-CM | POA: Diagnosis not present

## 2022-12-10 DIAGNOSIS — R279 Unspecified lack of coordination: Secondary | ICD-10-CM | POA: Diagnosis not present

## 2022-12-10 DIAGNOSIS — M47812 Spondylosis without myelopathy or radiculopathy, cervical region: Secondary | ICD-10-CM | POA: Diagnosis not present

## 2022-12-10 DIAGNOSIS — M6281 Muscle weakness (generalized): Secondary | ICD-10-CM | POA: Diagnosis not present

## 2022-12-10 DIAGNOSIS — R2681 Unsteadiness on feet: Secondary | ICD-10-CM | POA: Diagnosis not present

## 2022-12-12 DIAGNOSIS — F331 Major depressive disorder, recurrent, moderate: Secondary | ICD-10-CM | POA: Diagnosis not present

## 2022-12-12 DIAGNOSIS — F411 Generalized anxiety disorder: Secondary | ICD-10-CM | POA: Diagnosis not present

## 2022-12-15 DIAGNOSIS — R279 Unspecified lack of coordination: Secondary | ICD-10-CM | POA: Diagnosis not present

## 2022-12-15 DIAGNOSIS — R2681 Unsteadiness on feet: Secondary | ICD-10-CM | POA: Diagnosis not present

## 2022-12-15 DIAGNOSIS — F4323 Adjustment disorder with mixed anxiety and depressed mood: Secondary | ICD-10-CM | POA: Diagnosis not present

## 2022-12-15 DIAGNOSIS — M6281 Muscle weakness (generalized): Secondary | ICD-10-CM | POA: Diagnosis not present

## 2022-12-15 NOTE — Telephone Encounter (Signed)
Patient was seen 11/21

## 2022-12-16 DIAGNOSIS — J441 Chronic obstructive pulmonary disease with (acute) exacerbation: Secondary | ICD-10-CM | POA: Diagnosis not present

## 2022-12-16 DIAGNOSIS — R0602 Shortness of breath: Secondary | ICD-10-CM | POA: Diagnosis not present

## 2022-12-16 DIAGNOSIS — R7303 Prediabetes: Secondary | ICD-10-CM | POA: Diagnosis not present

## 2022-12-16 DIAGNOSIS — G40909 Epilepsy, unspecified, not intractable, without status epilepticus: Secondary | ICD-10-CM | POA: Diagnosis not present

## 2022-12-16 DIAGNOSIS — R0989 Other specified symptoms and signs involving the circulatory and respiratory systems: Secondary | ICD-10-CM | POA: Diagnosis not present

## 2022-12-16 DIAGNOSIS — J019 Acute sinusitis, unspecified: Secondary | ICD-10-CM | POA: Diagnosis not present

## 2022-12-16 DIAGNOSIS — E559 Vitamin D deficiency, unspecified: Secondary | ICD-10-CM | POA: Diagnosis not present

## 2022-12-16 DIAGNOSIS — J189 Pneumonia, unspecified organism: Secondary | ICD-10-CM | POA: Diagnosis not present

## 2022-12-18 DIAGNOSIS — R2681 Unsteadiness on feet: Secondary | ICD-10-CM | POA: Diagnosis not present

## 2022-12-18 DIAGNOSIS — R279 Unspecified lack of coordination: Secondary | ICD-10-CM | POA: Diagnosis not present

## 2022-12-18 DIAGNOSIS — M6281 Muscle weakness (generalized): Secondary | ICD-10-CM | POA: Diagnosis not present

## 2022-12-23 DIAGNOSIS — J441 Chronic obstructive pulmonary disease with (acute) exacerbation: Secondary | ICD-10-CM | POA: Diagnosis not present

## 2022-12-23 DIAGNOSIS — Z9981 Dependence on supplemental oxygen: Secondary | ICD-10-CM | POA: Diagnosis not present

## 2022-12-23 DIAGNOSIS — J189 Pneumonia, unspecified organism: Secondary | ICD-10-CM | POA: Diagnosis not present

## 2022-12-23 DIAGNOSIS — J019 Acute sinusitis, unspecified: Secondary | ICD-10-CM | POA: Diagnosis not present

## 2022-12-25 ENCOUNTER — Ambulatory Visit (HOSPITAL_BASED_OUTPATIENT_CLINIC_OR_DEPARTMENT_OTHER): Payer: Medicare Other | Admitting: Pulmonary Disease

## 2022-12-26 DIAGNOSIS — H43393 Other vitreous opacities, bilateral: Secondary | ICD-10-CM | POA: Diagnosis not present

## 2022-12-26 DIAGNOSIS — Z961 Presence of intraocular lens: Secondary | ICD-10-CM | POA: Diagnosis not present

## 2022-12-27 DIAGNOSIS — M6281 Muscle weakness (generalized): Secondary | ICD-10-CM | POA: Diagnosis not present

## 2022-12-27 DIAGNOSIS — R279 Unspecified lack of coordination: Secondary | ICD-10-CM | POA: Diagnosis not present

## 2022-12-27 DIAGNOSIS — R2681 Unsteadiness on feet: Secondary | ICD-10-CM | POA: Diagnosis not present

## 2022-12-31 DIAGNOSIS — C3431 Malignant neoplasm of lower lobe, right bronchus or lung: Secondary | ICD-10-CM | POA: Diagnosis not present

## 2023-01-01 DIAGNOSIS — Z515 Encounter for palliative care: Secondary | ICD-10-CM | POA: Diagnosis not present

## 2023-01-01 DIAGNOSIS — B3731 Acute candidiasis of vulva and vagina: Secondary | ICD-10-CM | POA: Diagnosis not present

## 2023-01-01 DIAGNOSIS — K649 Unspecified hemorrhoids: Secondary | ICD-10-CM | POA: Diagnosis not present

## 2023-01-01 DIAGNOSIS — J441 Chronic obstructive pulmonary disease with (acute) exacerbation: Secondary | ICD-10-CM | POA: Diagnosis not present

## 2023-01-01 DIAGNOSIS — Z9981 Dependence on supplemental oxygen: Secondary | ICD-10-CM | POA: Diagnosis not present

## 2023-01-01 DIAGNOSIS — J189 Pneumonia, unspecified organism: Secondary | ICD-10-CM | POA: Diagnosis not present

## 2023-01-08 DIAGNOSIS — I6990 Unspecified sequelae of unspecified cerebrovascular disease: Secondary | ICD-10-CM | POA: Diagnosis not present

## 2023-01-08 DIAGNOSIS — R609 Edema, unspecified: Secondary | ICD-10-CM | POA: Diagnosis not present

## 2023-01-08 DIAGNOSIS — C3431 Malignant neoplasm of lower lobe, right bronchus or lung: Secondary | ICD-10-CM | POA: Diagnosis not present

## 2023-01-08 DIAGNOSIS — J441 Chronic obstructive pulmonary disease with (acute) exacerbation: Secondary | ICD-10-CM | POA: Diagnosis not present

## 2023-01-08 DIAGNOSIS — Z515 Encounter for palliative care: Secondary | ICD-10-CM | POA: Diagnosis not present

## 2023-01-08 DIAGNOSIS — I480 Paroxysmal atrial fibrillation: Secondary | ICD-10-CM | POA: Diagnosis not present

## 2023-01-09 DIAGNOSIS — F411 Generalized anxiety disorder: Secondary | ICD-10-CM | POA: Diagnosis not present

## 2023-01-09 DIAGNOSIS — F331 Major depressive disorder, recurrent, moderate: Secondary | ICD-10-CM | POA: Diagnosis not present

## 2023-01-09 DIAGNOSIS — G4700 Insomnia, unspecified: Secondary | ICD-10-CM | POA: Diagnosis not present

## 2023-01-12 DIAGNOSIS — F411 Generalized anxiety disorder: Secondary | ICD-10-CM | POA: Diagnosis not present

## 2023-01-12 DIAGNOSIS — M47812 Spondylosis without myelopathy or radiculopathy, cervical region: Secondary | ICD-10-CM | POA: Diagnosis not present

## 2023-01-12 DIAGNOSIS — F331 Major depressive disorder, recurrent, moderate: Secondary | ICD-10-CM | POA: Diagnosis not present

## 2023-01-12 DIAGNOSIS — D649 Anemia, unspecified: Secondary | ICD-10-CM | POA: Diagnosis not present

## 2023-01-13 DIAGNOSIS — F419 Anxiety disorder, unspecified: Secondary | ICD-10-CM | POA: Diagnosis not present

## 2023-01-13 DIAGNOSIS — M545 Low back pain, unspecified: Secondary | ICD-10-CM | POA: Diagnosis not present

## 2023-01-13 DIAGNOSIS — C3411 Malignant neoplasm of upper lobe, right bronchus or lung: Secondary | ICD-10-CM | POA: Diagnosis not present

## 2023-01-13 DIAGNOSIS — Z515 Encounter for palliative care: Secondary | ICD-10-CM | POA: Diagnosis not present

## 2023-01-13 DIAGNOSIS — R609 Edema, unspecified: Secondary | ICD-10-CM | POA: Diagnosis not present

## 2023-01-13 DIAGNOSIS — I10 Essential (primary) hypertension: Secondary | ICD-10-CM | POA: Diagnosis not present

## 2023-01-13 DIAGNOSIS — J9611 Chronic respiratory failure with hypoxia: Secondary | ICD-10-CM | POA: Diagnosis not present

## 2023-01-14 DIAGNOSIS — M79604 Pain in right leg: Secondary | ICD-10-CM | POA: Diagnosis not present

## 2023-01-14 DIAGNOSIS — M79605 Pain in left leg: Secondary | ICD-10-CM | POA: Diagnosis not present

## 2023-01-15 DIAGNOSIS — I480 Paroxysmal atrial fibrillation: Secondary | ICD-10-CM | POA: Diagnosis not present

## 2023-01-15 DIAGNOSIS — I679 Cerebrovascular disease, unspecified: Secondary | ICD-10-CM | POA: Diagnosis not present

## 2023-01-15 DIAGNOSIS — Z9981 Dependence on supplemental oxygen: Secondary | ICD-10-CM | POA: Diagnosis not present

## 2023-01-15 DIAGNOSIS — I10 Essential (primary) hypertension: Secondary | ICD-10-CM | POA: Diagnosis not present

## 2023-01-15 DIAGNOSIS — J309 Allergic rhinitis, unspecified: Secondary | ICD-10-CM | POA: Diagnosis not present

## 2023-01-15 DIAGNOSIS — I69354 Hemiplegia and hemiparesis following cerebral infarction affecting left non-dominant side: Secondary | ICD-10-CM | POA: Diagnosis not present

## 2023-01-15 DIAGNOSIS — Z1501 Genetic susceptibility to malignant neoplasm of breast: Secondary | ICD-10-CM | POA: Diagnosis not present

## 2023-01-15 DIAGNOSIS — E119 Type 2 diabetes mellitus without complications: Secondary | ICD-10-CM | POA: Diagnosis not present

## 2023-01-15 DIAGNOSIS — M47895 Other spondylosis, thoracolumbar region: Secondary | ICD-10-CM | POA: Diagnosis not present

## 2023-01-15 DIAGNOSIS — Z8701 Personal history of pneumonia (recurrent): Secondary | ICD-10-CM | POA: Diagnosis not present

## 2023-01-15 DIAGNOSIS — J441 Chronic obstructive pulmonary disease with (acute) exacerbation: Secondary | ICD-10-CM | POA: Diagnosis not present

## 2023-01-15 DIAGNOSIS — R202 Paresthesia of skin: Secondary | ICD-10-CM | POA: Diagnosis not present

## 2023-01-15 DIAGNOSIS — J449 Chronic obstructive pulmonary disease, unspecified: Secondary | ICD-10-CM | POA: Diagnosis not present

## 2023-01-15 DIAGNOSIS — E669 Obesity, unspecified: Secondary | ICD-10-CM | POA: Diagnosis not present

## 2023-01-15 DIAGNOSIS — C3431 Malignant neoplasm of lower lobe, right bronchus or lung: Secondary | ICD-10-CM | POA: Diagnosis not present

## 2023-01-15 DIAGNOSIS — E785 Hyperlipidemia, unspecified: Secondary | ICD-10-CM | POA: Diagnosis not present

## 2023-01-15 DIAGNOSIS — Z8679 Personal history of other diseases of the circulatory system: Secondary | ICD-10-CM | POA: Diagnosis not present

## 2023-01-15 DIAGNOSIS — F01A3 Vascular dementia, mild, with mood disturbance: Secondary | ICD-10-CM | POA: Diagnosis not present

## 2023-01-15 DIAGNOSIS — Z9181 History of falling: Secondary | ICD-10-CM | POA: Diagnosis not present

## 2023-01-15 DIAGNOSIS — Z87891 Personal history of nicotine dependence: Secondary | ICD-10-CM | POA: Diagnosis not present

## 2023-01-15 DIAGNOSIS — D509 Iron deficiency anemia, unspecified: Secondary | ICD-10-CM | POA: Diagnosis not present

## 2023-01-15 DIAGNOSIS — C3490 Malignant neoplasm of unspecified part of unspecified bronchus or lung: Secondary | ICD-10-CM | POA: Diagnosis not present

## 2023-01-15 DIAGNOSIS — G40909 Epilepsy, unspecified, not intractable, without status epilepticus: Secondary | ICD-10-CM | POA: Diagnosis not present

## 2023-01-15 DIAGNOSIS — K219 Gastro-esophageal reflux disease without esophagitis: Secondary | ICD-10-CM | POA: Diagnosis not present

## 2023-01-15 DIAGNOSIS — K5732 Diverticulitis of large intestine without perforation or abscess without bleeding: Secondary | ICD-10-CM | POA: Diagnosis not present

## 2023-01-15 DIAGNOSIS — I482 Chronic atrial fibrillation, unspecified: Secondary | ICD-10-CM | POA: Diagnosis not present

## 2023-01-15 DIAGNOSIS — F01A4 Vascular dementia, mild, with anxiety: Secondary | ICD-10-CM | POA: Diagnosis not present

## 2023-01-15 DIAGNOSIS — R609 Edema, unspecified: Secondary | ICD-10-CM | POA: Diagnosis not present

## 2023-01-16 DIAGNOSIS — I679 Cerebrovascular disease, unspecified: Secondary | ICD-10-CM | POA: Diagnosis not present

## 2023-01-16 DIAGNOSIS — I69354 Hemiplegia and hemiparesis following cerebral infarction affecting left non-dominant side: Secondary | ICD-10-CM | POA: Diagnosis not present

## 2023-01-16 DIAGNOSIS — J449 Chronic obstructive pulmonary disease, unspecified: Secondary | ICD-10-CM | POA: Diagnosis not present

## 2023-01-16 DIAGNOSIS — F01A4 Vascular dementia, mild, with anxiety: Secondary | ICD-10-CM | POA: Diagnosis not present

## 2023-01-16 DIAGNOSIS — Z9981 Dependence on supplemental oxygen: Secondary | ICD-10-CM | POA: Diagnosis not present

## 2023-01-16 DIAGNOSIS — C3431 Malignant neoplasm of lower lobe, right bronchus or lung: Secondary | ICD-10-CM | POA: Diagnosis not present

## 2023-01-19 DIAGNOSIS — I679 Cerebrovascular disease, unspecified: Secondary | ICD-10-CM | POA: Diagnosis not present

## 2023-01-19 DIAGNOSIS — I69354 Hemiplegia and hemiparesis following cerebral infarction affecting left non-dominant side: Secondary | ICD-10-CM | POA: Diagnosis not present

## 2023-01-19 DIAGNOSIS — C3431 Malignant neoplasm of lower lobe, right bronchus or lung: Secondary | ICD-10-CM | POA: Diagnosis not present

## 2023-01-19 DIAGNOSIS — F01A4 Vascular dementia, mild, with anxiety: Secondary | ICD-10-CM | POA: Diagnosis not present

## 2023-01-19 DIAGNOSIS — J449 Chronic obstructive pulmonary disease, unspecified: Secondary | ICD-10-CM | POA: Diagnosis not present

## 2023-01-19 DIAGNOSIS — Z9981 Dependence on supplemental oxygen: Secondary | ICD-10-CM | POA: Diagnosis not present

## 2023-01-21 DIAGNOSIS — J449 Chronic obstructive pulmonary disease, unspecified: Secondary | ICD-10-CM | POA: Diagnosis not present

## 2023-01-21 DIAGNOSIS — Z9981 Dependence on supplemental oxygen: Secondary | ICD-10-CM | POA: Diagnosis not present

## 2023-01-21 DIAGNOSIS — C3431 Malignant neoplasm of lower lobe, right bronchus or lung: Secondary | ICD-10-CM | POA: Diagnosis not present

## 2023-01-21 DIAGNOSIS — F01A4 Vascular dementia, mild, with anxiety: Secondary | ICD-10-CM | POA: Diagnosis not present

## 2023-01-21 DIAGNOSIS — I69354 Hemiplegia and hemiparesis following cerebral infarction affecting left non-dominant side: Secondary | ICD-10-CM | POA: Diagnosis not present

## 2023-01-21 DIAGNOSIS — I679 Cerebrovascular disease, unspecified: Secondary | ICD-10-CM | POA: Diagnosis not present

## 2023-01-22 DIAGNOSIS — I480 Paroxysmal atrial fibrillation: Secondary | ICD-10-CM | POA: Diagnosis not present

## 2023-01-22 DIAGNOSIS — I5022 Chronic systolic (congestive) heart failure: Secondary | ICD-10-CM | POA: Diagnosis not present

## 2023-01-23 DIAGNOSIS — I679 Cerebrovascular disease, unspecified: Secondary | ICD-10-CM | POA: Diagnosis not present

## 2023-01-23 DIAGNOSIS — F01A4 Vascular dementia, mild, with anxiety: Secondary | ICD-10-CM | POA: Diagnosis not present

## 2023-01-23 DIAGNOSIS — I69354 Hemiplegia and hemiparesis following cerebral infarction affecting left non-dominant side: Secondary | ICD-10-CM | POA: Diagnosis not present

## 2023-01-23 DIAGNOSIS — Z9981 Dependence on supplemental oxygen: Secondary | ICD-10-CM | POA: Diagnosis not present

## 2023-01-23 DIAGNOSIS — J449 Chronic obstructive pulmonary disease, unspecified: Secondary | ICD-10-CM | POA: Diagnosis not present

## 2023-01-23 DIAGNOSIS — C3431 Malignant neoplasm of lower lobe, right bronchus or lung: Secondary | ICD-10-CM | POA: Diagnosis not present

## 2023-01-26 DIAGNOSIS — F419 Anxiety disorder, unspecified: Secondary | ICD-10-CM | POA: Diagnosis not present

## 2023-01-26 DIAGNOSIS — F331 Major depressive disorder, recurrent, moderate: Secondary | ICD-10-CM | POA: Diagnosis not present

## 2023-01-27 DIAGNOSIS — R609 Edema, unspecified: Secondary | ICD-10-CM | POA: Diagnosis not present

## 2023-01-27 DIAGNOSIS — F419 Anxiety disorder, unspecified: Secondary | ICD-10-CM | POA: Diagnosis not present

## 2023-01-27 DIAGNOSIS — R8290 Unspecified abnormal findings in urine: Secondary | ICD-10-CM | POA: Diagnosis not present

## 2023-01-27 DIAGNOSIS — Z515 Encounter for palliative care: Secondary | ICD-10-CM | POA: Diagnosis not present

## 2023-01-28 DIAGNOSIS — Z9981 Dependence on supplemental oxygen: Secondary | ICD-10-CM | POA: Diagnosis not present

## 2023-01-28 DIAGNOSIS — I679 Cerebrovascular disease, unspecified: Secondary | ICD-10-CM | POA: Diagnosis not present

## 2023-01-28 DIAGNOSIS — F01A4 Vascular dementia, mild, with anxiety: Secondary | ICD-10-CM | POA: Diagnosis not present

## 2023-01-28 DIAGNOSIS — C3431 Malignant neoplasm of lower lobe, right bronchus or lung: Secondary | ICD-10-CM | POA: Diagnosis not present

## 2023-01-28 DIAGNOSIS — I69354 Hemiplegia and hemiparesis following cerebral infarction affecting left non-dominant side: Secondary | ICD-10-CM | POA: Diagnosis not present

## 2023-01-28 DIAGNOSIS — J449 Chronic obstructive pulmonary disease, unspecified: Secondary | ICD-10-CM | POA: Diagnosis not present

## 2023-01-29 DIAGNOSIS — F01A4 Vascular dementia, mild, with anxiety: Secondary | ICD-10-CM | POA: Diagnosis not present

## 2023-01-29 DIAGNOSIS — Z9981 Dependence on supplemental oxygen: Secondary | ICD-10-CM | POA: Diagnosis not present

## 2023-01-29 DIAGNOSIS — I679 Cerebrovascular disease, unspecified: Secondary | ICD-10-CM | POA: Diagnosis not present

## 2023-01-29 DIAGNOSIS — C3431 Malignant neoplasm of lower lobe, right bronchus or lung: Secondary | ICD-10-CM | POA: Diagnosis not present

## 2023-01-29 DIAGNOSIS — I69354 Hemiplegia and hemiparesis following cerebral infarction affecting left non-dominant side: Secondary | ICD-10-CM | POA: Diagnosis not present

## 2023-01-29 DIAGNOSIS — J449 Chronic obstructive pulmonary disease, unspecified: Secondary | ICD-10-CM | POA: Diagnosis not present

## 2023-01-30 DIAGNOSIS — I679 Cerebrovascular disease, unspecified: Secondary | ICD-10-CM | POA: Diagnosis not present

## 2023-01-30 DIAGNOSIS — F01A4 Vascular dementia, mild, with anxiety: Secondary | ICD-10-CM | POA: Diagnosis not present

## 2023-01-30 DIAGNOSIS — I69354 Hemiplegia and hemiparesis following cerebral infarction affecting left non-dominant side: Secondary | ICD-10-CM | POA: Diagnosis not present

## 2023-01-30 DIAGNOSIS — Z9981 Dependence on supplemental oxygen: Secondary | ICD-10-CM | POA: Diagnosis not present

## 2023-01-30 DIAGNOSIS — C3431 Malignant neoplasm of lower lobe, right bronchus or lung: Secondary | ICD-10-CM | POA: Diagnosis not present

## 2023-01-30 DIAGNOSIS — J449 Chronic obstructive pulmonary disease, unspecified: Secondary | ICD-10-CM | POA: Diagnosis not present

## 2023-02-03 DIAGNOSIS — J441 Chronic obstructive pulmonary disease with (acute) exacerbation: Secondary | ICD-10-CM | POA: Diagnosis not present

## 2023-02-03 DIAGNOSIS — Z515 Encounter for palliative care: Secondary | ICD-10-CM | POA: Diagnosis not present

## 2023-02-03 DIAGNOSIS — J9610 Chronic respiratory failure, unspecified whether with hypoxia or hypercapnia: Secondary | ICD-10-CM | POA: Diagnosis not present

## 2023-02-03 DIAGNOSIS — R0602 Shortness of breath: Secondary | ICD-10-CM | POA: Diagnosis not present

## 2023-02-03 DIAGNOSIS — R350 Frequency of micturition: Secondary | ICD-10-CM | POA: Diagnosis not present

## 2023-02-04 DIAGNOSIS — F01A4 Vascular dementia, mild, with anxiety: Secondary | ICD-10-CM | POA: Diagnosis not present

## 2023-02-04 DIAGNOSIS — J449 Chronic obstructive pulmonary disease, unspecified: Secondary | ICD-10-CM | POA: Diagnosis not present

## 2023-02-04 DIAGNOSIS — I679 Cerebrovascular disease, unspecified: Secondary | ICD-10-CM | POA: Diagnosis not present

## 2023-02-04 DIAGNOSIS — C3431 Malignant neoplasm of lower lobe, right bronchus or lung: Secondary | ICD-10-CM | POA: Diagnosis not present

## 2023-02-04 DIAGNOSIS — Z9981 Dependence on supplemental oxygen: Secondary | ICD-10-CM | POA: Diagnosis not present

## 2023-02-04 DIAGNOSIS — I69354 Hemiplegia and hemiparesis following cerebral infarction affecting left non-dominant side: Secondary | ICD-10-CM | POA: Diagnosis not present

## 2023-02-06 DIAGNOSIS — J449 Chronic obstructive pulmonary disease, unspecified: Secondary | ICD-10-CM | POA: Diagnosis not present

## 2023-02-06 DIAGNOSIS — Z9981 Dependence on supplemental oxygen: Secondary | ICD-10-CM | POA: Diagnosis not present

## 2023-02-06 DIAGNOSIS — F01A4 Vascular dementia, mild, with anxiety: Secondary | ICD-10-CM | POA: Diagnosis not present

## 2023-02-06 DIAGNOSIS — F419 Anxiety disorder, unspecified: Secondary | ICD-10-CM | POA: Diagnosis not present

## 2023-02-06 DIAGNOSIS — C3431 Malignant neoplasm of lower lobe, right bronchus or lung: Secondary | ICD-10-CM | POA: Diagnosis not present

## 2023-02-06 DIAGNOSIS — I69354 Hemiplegia and hemiparesis following cerebral infarction affecting left non-dominant side: Secondary | ICD-10-CM | POA: Diagnosis not present

## 2023-02-06 DIAGNOSIS — F331 Major depressive disorder, recurrent, moderate: Secondary | ICD-10-CM | POA: Diagnosis not present

## 2023-02-06 DIAGNOSIS — I679 Cerebrovascular disease, unspecified: Secondary | ICD-10-CM | POA: Diagnosis not present

## 2023-02-09 DIAGNOSIS — F331 Major depressive disorder, recurrent, moderate: Secondary | ICD-10-CM | POA: Diagnosis not present

## 2023-02-09 DIAGNOSIS — F419 Anxiety disorder, unspecified: Secondary | ICD-10-CM | POA: Diagnosis not present

## 2023-02-11 DIAGNOSIS — I679 Cerebrovascular disease, unspecified: Secondary | ICD-10-CM | POA: Diagnosis not present

## 2023-02-11 DIAGNOSIS — I69354 Hemiplegia and hemiparesis following cerebral infarction affecting left non-dominant side: Secondary | ICD-10-CM | POA: Diagnosis not present

## 2023-02-11 DIAGNOSIS — Z9981 Dependence on supplemental oxygen: Secondary | ICD-10-CM | POA: Diagnosis not present

## 2023-02-11 DIAGNOSIS — J449 Chronic obstructive pulmonary disease, unspecified: Secondary | ICD-10-CM | POA: Diagnosis not present

## 2023-02-11 DIAGNOSIS — F01A4 Vascular dementia, mild, with anxiety: Secondary | ICD-10-CM | POA: Diagnosis not present

## 2023-02-11 DIAGNOSIS — C3431 Malignant neoplasm of lower lobe, right bronchus or lung: Secondary | ICD-10-CM | POA: Diagnosis not present

## 2023-02-12 DIAGNOSIS — J441 Chronic obstructive pulmonary disease with (acute) exacerbation: Secondary | ICD-10-CM | POA: Diagnosis not present

## 2023-02-12 DIAGNOSIS — G40909 Epilepsy, unspecified, not intractable, without status epilepticus: Secondary | ICD-10-CM | POA: Diagnosis not present

## 2023-02-12 DIAGNOSIS — R300 Dysuria: Secondary | ICD-10-CM | POA: Diagnosis not present

## 2023-02-12 DIAGNOSIS — J019 Acute sinusitis, unspecified: Secondary | ICD-10-CM | POA: Diagnosis not present

## 2023-02-12 DIAGNOSIS — Z9981 Dependence on supplemental oxygen: Secondary | ICD-10-CM | POA: Diagnosis not present

## 2023-02-12 DIAGNOSIS — F331 Major depressive disorder, recurrent, moderate: Secondary | ICD-10-CM | POA: Diagnosis not present

## 2023-02-12 DIAGNOSIS — J189 Pneumonia, unspecified organism: Secondary | ICD-10-CM | POA: Diagnosis not present

## 2023-02-12 DIAGNOSIS — R350 Frequency of micturition: Secondary | ICD-10-CM | POA: Diagnosis not present

## 2023-02-13 DIAGNOSIS — J449 Chronic obstructive pulmonary disease, unspecified: Secondary | ICD-10-CM | POA: Diagnosis not present

## 2023-02-13 DIAGNOSIS — I69354 Hemiplegia and hemiparesis following cerebral infarction affecting left non-dominant side: Secondary | ICD-10-CM | POA: Diagnosis not present

## 2023-02-13 DIAGNOSIS — Z9981 Dependence on supplemental oxygen: Secondary | ICD-10-CM | POA: Diagnosis not present

## 2023-02-13 DIAGNOSIS — F01A4 Vascular dementia, mild, with anxiety: Secondary | ICD-10-CM | POA: Diagnosis not present

## 2023-02-13 DIAGNOSIS — C3431 Malignant neoplasm of lower lobe, right bronchus or lung: Secondary | ICD-10-CM | POA: Diagnosis not present

## 2023-02-13 DIAGNOSIS — I679 Cerebrovascular disease, unspecified: Secondary | ICD-10-CM | POA: Diagnosis not present

## 2023-02-14 DIAGNOSIS — F01A3 Vascular dementia, mild, with mood disturbance: Secondary | ICD-10-CM | POA: Diagnosis not present

## 2023-02-14 DIAGNOSIS — C3490 Malignant neoplasm of unspecified part of unspecified bronchus or lung: Secondary | ICD-10-CM | POA: Diagnosis not present

## 2023-02-14 DIAGNOSIS — E669 Obesity, unspecified: Secondary | ICD-10-CM | POA: Diagnosis not present

## 2023-02-14 DIAGNOSIS — I69354 Hemiplegia and hemiparesis following cerebral infarction affecting left non-dominant side: Secondary | ICD-10-CM | POA: Diagnosis not present

## 2023-02-14 DIAGNOSIS — I482 Chronic atrial fibrillation, unspecified: Secondary | ICD-10-CM | POA: Diagnosis not present

## 2023-02-14 DIAGNOSIS — Z8701 Personal history of pneumonia (recurrent): Secondary | ICD-10-CM | POA: Diagnosis not present

## 2023-02-14 DIAGNOSIS — I10 Essential (primary) hypertension: Secondary | ICD-10-CM | POA: Diagnosis not present

## 2023-02-14 DIAGNOSIS — E119 Type 2 diabetes mellitus without complications: Secondary | ICD-10-CM | POA: Diagnosis not present

## 2023-02-14 DIAGNOSIS — C3431 Malignant neoplasm of lower lobe, right bronchus or lung: Secondary | ICD-10-CM | POA: Diagnosis not present

## 2023-02-14 DIAGNOSIS — F01A4 Vascular dementia, mild, with anxiety: Secondary | ICD-10-CM | POA: Diagnosis not present

## 2023-02-14 DIAGNOSIS — Z1501 Genetic susceptibility to malignant neoplasm of breast: Secondary | ICD-10-CM | POA: Diagnosis not present

## 2023-02-14 DIAGNOSIS — Z9981 Dependence on supplemental oxygen: Secondary | ICD-10-CM | POA: Diagnosis not present

## 2023-02-14 DIAGNOSIS — G40909 Epilepsy, unspecified, not intractable, without status epilepticus: Secondary | ICD-10-CM | POA: Diagnosis not present

## 2023-02-14 DIAGNOSIS — D509 Iron deficiency anemia, unspecified: Secondary | ICD-10-CM | POA: Diagnosis not present

## 2023-02-14 DIAGNOSIS — M47895 Other spondylosis, thoracolumbar region: Secondary | ICD-10-CM | POA: Diagnosis not present

## 2023-02-14 DIAGNOSIS — Z8679 Personal history of other diseases of the circulatory system: Secondary | ICD-10-CM | POA: Diagnosis not present

## 2023-02-14 DIAGNOSIS — Z9181 History of falling: Secondary | ICD-10-CM | POA: Diagnosis not present

## 2023-02-14 DIAGNOSIS — E785 Hyperlipidemia, unspecified: Secondary | ICD-10-CM | POA: Diagnosis not present

## 2023-02-14 DIAGNOSIS — I679 Cerebrovascular disease, unspecified: Secondary | ICD-10-CM | POA: Diagnosis not present

## 2023-02-14 DIAGNOSIS — K219 Gastro-esophageal reflux disease without esophagitis: Secondary | ICD-10-CM | POA: Diagnosis not present

## 2023-02-14 DIAGNOSIS — Z87891 Personal history of nicotine dependence: Secondary | ICD-10-CM | POA: Diagnosis not present

## 2023-02-14 DIAGNOSIS — J309 Allergic rhinitis, unspecified: Secondary | ICD-10-CM | POA: Diagnosis not present

## 2023-02-14 DIAGNOSIS — R202 Paresthesia of skin: Secondary | ICD-10-CM | POA: Diagnosis not present

## 2023-02-14 DIAGNOSIS — J449 Chronic obstructive pulmonary disease, unspecified: Secondary | ICD-10-CM | POA: Diagnosis not present

## 2023-02-14 DIAGNOSIS — K5732 Diverticulitis of large intestine without perforation or abscess without bleeding: Secondary | ICD-10-CM | POA: Diagnosis not present

## 2023-02-18 DIAGNOSIS — C3431 Malignant neoplasm of lower lobe, right bronchus or lung: Secondary | ICD-10-CM | POA: Diagnosis not present

## 2023-02-18 DIAGNOSIS — F01A4 Vascular dementia, mild, with anxiety: Secondary | ICD-10-CM | POA: Diagnosis not present

## 2023-02-18 DIAGNOSIS — I69354 Hemiplegia and hemiparesis following cerebral infarction affecting left non-dominant side: Secondary | ICD-10-CM | POA: Diagnosis not present

## 2023-02-18 DIAGNOSIS — I679 Cerebrovascular disease, unspecified: Secondary | ICD-10-CM | POA: Diagnosis not present

## 2023-02-18 DIAGNOSIS — Z9981 Dependence on supplemental oxygen: Secondary | ICD-10-CM | POA: Diagnosis not present

## 2023-02-18 DIAGNOSIS — J449 Chronic obstructive pulmonary disease, unspecified: Secondary | ICD-10-CM | POA: Diagnosis not present

## 2023-02-19 DIAGNOSIS — G629 Polyneuropathy, unspecified: Secondary | ICD-10-CM | POA: Diagnosis not present

## 2023-02-19 DIAGNOSIS — J019 Acute sinusitis, unspecified: Secondary | ICD-10-CM | POA: Diagnosis not present

## 2023-02-19 DIAGNOSIS — G8929 Other chronic pain: Secondary | ICD-10-CM | POA: Diagnosis not present

## 2023-02-20 DIAGNOSIS — Z9981 Dependence on supplemental oxygen: Secondary | ICD-10-CM | POA: Diagnosis not present

## 2023-02-20 DIAGNOSIS — J449 Chronic obstructive pulmonary disease, unspecified: Secondary | ICD-10-CM | POA: Diagnosis not present

## 2023-02-20 DIAGNOSIS — F01A4 Vascular dementia, mild, with anxiety: Secondary | ICD-10-CM | POA: Diagnosis not present

## 2023-02-20 DIAGNOSIS — C3431 Malignant neoplasm of lower lobe, right bronchus or lung: Secondary | ICD-10-CM | POA: Diagnosis not present

## 2023-02-20 DIAGNOSIS — I679 Cerebrovascular disease, unspecified: Secondary | ICD-10-CM | POA: Diagnosis not present

## 2023-02-20 DIAGNOSIS — I69354 Hemiplegia and hemiparesis following cerebral infarction affecting left non-dominant side: Secondary | ICD-10-CM | POA: Diagnosis not present

## 2023-02-23 DIAGNOSIS — F331 Major depressive disorder, recurrent, moderate: Secondary | ICD-10-CM | POA: Diagnosis not present

## 2023-02-23 DIAGNOSIS — F419 Anxiety disorder, unspecified: Secondary | ICD-10-CM | POA: Diagnosis not present

## 2023-02-25 DIAGNOSIS — N390 Urinary tract infection, site not specified: Secondary | ICD-10-CM | POA: Diagnosis not present

## 2023-02-25 DIAGNOSIS — F01A4 Vascular dementia, mild, with anxiety: Secondary | ICD-10-CM | POA: Diagnosis not present

## 2023-02-25 DIAGNOSIS — I679 Cerebrovascular disease, unspecified: Secondary | ICD-10-CM | POA: Diagnosis not present

## 2023-02-25 DIAGNOSIS — Z9981 Dependence on supplemental oxygen: Secondary | ICD-10-CM | POA: Diagnosis not present

## 2023-02-25 DIAGNOSIS — J019 Acute sinusitis, unspecified: Secondary | ICD-10-CM | POA: Diagnosis not present

## 2023-02-25 DIAGNOSIS — J449 Chronic obstructive pulmonary disease, unspecified: Secondary | ICD-10-CM | POA: Diagnosis not present

## 2023-02-25 DIAGNOSIS — G40909 Epilepsy, unspecified, not intractable, without status epilepticus: Secondary | ICD-10-CM | POA: Diagnosis not present

## 2023-02-25 DIAGNOSIS — Z7951 Long term (current) use of inhaled steroids: Secondary | ICD-10-CM | POA: Diagnosis not present

## 2023-02-25 DIAGNOSIS — I69354 Hemiplegia and hemiparesis following cerebral infarction affecting left non-dominant side: Secondary | ICD-10-CM | POA: Diagnosis not present

## 2023-02-25 DIAGNOSIS — C3431 Malignant neoplasm of lower lobe, right bronchus or lung: Secondary | ICD-10-CM | POA: Diagnosis not present

## 2023-02-25 DIAGNOSIS — J440 Chronic obstructive pulmonary disease with acute lower respiratory infection: Secondary | ICD-10-CM | POA: Diagnosis not present

## 2023-02-26 DIAGNOSIS — B351 Tinea unguium: Secondary | ICD-10-CM | POA: Diagnosis not present

## 2023-02-26 DIAGNOSIS — E119 Type 2 diabetes mellitus without complications: Secondary | ICD-10-CM | POA: Diagnosis not present

## 2023-02-26 DIAGNOSIS — M79675 Pain in left toe(s): Secondary | ICD-10-CM | POA: Diagnosis not present

## 2023-03-03 DIAGNOSIS — C3431 Malignant neoplasm of lower lobe, right bronchus or lung: Secondary | ICD-10-CM | POA: Diagnosis not present

## 2023-03-03 DIAGNOSIS — I69354 Hemiplegia and hemiparesis following cerebral infarction affecting left non-dominant side: Secondary | ICD-10-CM | POA: Diagnosis not present

## 2023-03-03 DIAGNOSIS — Z9981 Dependence on supplemental oxygen: Secondary | ICD-10-CM | POA: Diagnosis not present

## 2023-03-03 DIAGNOSIS — I679 Cerebrovascular disease, unspecified: Secondary | ICD-10-CM | POA: Diagnosis not present

## 2023-03-03 DIAGNOSIS — J449 Chronic obstructive pulmonary disease, unspecified: Secondary | ICD-10-CM | POA: Diagnosis not present

## 2023-03-03 DIAGNOSIS — F01A4 Vascular dementia, mild, with anxiety: Secondary | ICD-10-CM | POA: Diagnosis not present

## 2023-03-06 DIAGNOSIS — F411 Generalized anxiety disorder: Secondary | ICD-10-CM | POA: Diagnosis not present

## 2023-03-06 DIAGNOSIS — F331 Major depressive disorder, recurrent, moderate: Secondary | ICD-10-CM | POA: Diagnosis not present

## 2023-03-09 DIAGNOSIS — F419 Anxiety disorder, unspecified: Secondary | ICD-10-CM | POA: Diagnosis not present

## 2023-03-09 DIAGNOSIS — F331 Major depressive disorder, recurrent, moderate: Secondary | ICD-10-CM | POA: Diagnosis not present

## 2023-03-11 DIAGNOSIS — I679 Cerebrovascular disease, unspecified: Secondary | ICD-10-CM | POA: Diagnosis not present

## 2023-03-11 DIAGNOSIS — C3431 Malignant neoplasm of lower lobe, right bronchus or lung: Secondary | ICD-10-CM | POA: Diagnosis not present

## 2023-03-11 DIAGNOSIS — J449 Chronic obstructive pulmonary disease, unspecified: Secondary | ICD-10-CM | POA: Diagnosis not present

## 2023-03-11 DIAGNOSIS — F01A4 Vascular dementia, mild, with anxiety: Secondary | ICD-10-CM | POA: Diagnosis not present

## 2023-03-11 DIAGNOSIS — I69354 Hemiplegia and hemiparesis following cerebral infarction affecting left non-dominant side: Secondary | ICD-10-CM | POA: Diagnosis not present

## 2023-03-11 DIAGNOSIS — Z9981 Dependence on supplemental oxygen: Secondary | ICD-10-CM | POA: Diagnosis not present

## 2023-03-12 DIAGNOSIS — I129 Hypertensive chronic kidney disease with stage 1 through stage 4 chronic kidney disease, or unspecified chronic kidney disease: Secondary | ICD-10-CM | POA: Diagnosis not present

## 2023-03-12 DIAGNOSIS — C3411 Malignant neoplasm of upper lobe, right bronchus or lung: Secondary | ICD-10-CM | POA: Diagnosis not present

## 2023-03-12 DIAGNOSIS — K5900 Constipation, unspecified: Secondary | ICD-10-CM | POA: Diagnosis not present

## 2023-03-12 DIAGNOSIS — J9610 Chronic respiratory failure, unspecified whether with hypoxia or hypercapnia: Secondary | ICD-10-CM | POA: Diagnosis not present

## 2023-03-12 DIAGNOSIS — J440 Chronic obstructive pulmonary disease with acute lower respiratory infection: Secondary | ICD-10-CM | POA: Diagnosis not present

## 2023-03-12 DIAGNOSIS — J189 Pneumonia, unspecified organism: Secondary | ICD-10-CM | POA: Diagnosis not present

## 2023-03-12 DIAGNOSIS — J019 Acute sinusitis, unspecified: Secondary | ICD-10-CM | POA: Diagnosis not present

## 2023-03-12 DIAGNOSIS — N182 Chronic kidney disease, stage 2 (mild): Secondary | ICD-10-CM | POA: Diagnosis not present

## 2023-03-12 DIAGNOSIS — Z9981 Dependence on supplemental oxygen: Secondary | ICD-10-CM | POA: Diagnosis not present

## 2023-03-13 DIAGNOSIS — F01A4 Vascular dementia, mild, with anxiety: Secondary | ICD-10-CM | POA: Diagnosis not present

## 2023-03-13 DIAGNOSIS — C3431 Malignant neoplasm of lower lobe, right bronchus or lung: Secondary | ICD-10-CM | POA: Diagnosis not present

## 2023-03-13 DIAGNOSIS — I69354 Hemiplegia and hemiparesis following cerebral infarction affecting left non-dominant side: Secondary | ICD-10-CM | POA: Diagnosis not present

## 2023-03-13 DIAGNOSIS — J449 Chronic obstructive pulmonary disease, unspecified: Secondary | ICD-10-CM | POA: Diagnosis not present

## 2023-03-13 DIAGNOSIS — I679 Cerebrovascular disease, unspecified: Secondary | ICD-10-CM | POA: Diagnosis not present

## 2023-03-13 DIAGNOSIS — Z9981 Dependence on supplemental oxygen: Secondary | ICD-10-CM | POA: Diagnosis not present

## 2023-03-14 DIAGNOSIS — C3490 Malignant neoplasm of unspecified part of unspecified bronchus or lung: Secondary | ICD-10-CM | POA: Diagnosis not present

## 2023-03-14 DIAGNOSIS — F01A4 Vascular dementia, mild, with anxiety: Secondary | ICD-10-CM | POA: Diagnosis not present

## 2023-03-14 DIAGNOSIS — Z87891 Personal history of nicotine dependence: Secondary | ICD-10-CM | POA: Diagnosis not present

## 2023-03-14 DIAGNOSIS — M47895 Other spondylosis, thoracolumbar region: Secondary | ICD-10-CM | POA: Diagnosis not present

## 2023-03-14 DIAGNOSIS — I69354 Hemiplegia and hemiparesis following cerebral infarction affecting left non-dominant side: Secondary | ICD-10-CM | POA: Diagnosis not present

## 2023-03-14 DIAGNOSIS — R202 Paresthesia of skin: Secondary | ICD-10-CM | POA: Diagnosis not present

## 2023-03-14 DIAGNOSIS — E785 Hyperlipidemia, unspecified: Secondary | ICD-10-CM | POA: Diagnosis not present

## 2023-03-14 DIAGNOSIS — I679 Cerebrovascular disease, unspecified: Secondary | ICD-10-CM | POA: Diagnosis not present

## 2023-03-14 DIAGNOSIS — Z9981 Dependence on supplemental oxygen: Secondary | ICD-10-CM | POA: Diagnosis not present

## 2023-03-14 DIAGNOSIS — J309 Allergic rhinitis, unspecified: Secondary | ICD-10-CM | POA: Diagnosis not present

## 2023-03-14 DIAGNOSIS — J449 Chronic obstructive pulmonary disease, unspecified: Secondary | ICD-10-CM | POA: Diagnosis not present

## 2023-03-14 DIAGNOSIS — Z9181 History of falling: Secondary | ICD-10-CM | POA: Diagnosis not present

## 2023-03-14 DIAGNOSIS — I482 Chronic atrial fibrillation, unspecified: Secondary | ICD-10-CM | POA: Diagnosis not present

## 2023-03-14 DIAGNOSIS — Z8701 Personal history of pneumonia (recurrent): Secondary | ICD-10-CM | POA: Diagnosis not present

## 2023-03-14 DIAGNOSIS — Z8679 Personal history of other diseases of the circulatory system: Secondary | ICD-10-CM | POA: Diagnosis not present

## 2023-03-14 DIAGNOSIS — K5732 Diverticulitis of large intestine without perforation or abscess without bleeding: Secondary | ICD-10-CM | POA: Diagnosis not present

## 2023-03-14 DIAGNOSIS — Z1501 Genetic susceptibility to malignant neoplasm of breast: Secondary | ICD-10-CM | POA: Diagnosis not present

## 2023-03-14 DIAGNOSIS — C3431 Malignant neoplasm of lower lobe, right bronchus or lung: Secondary | ICD-10-CM | POA: Diagnosis not present

## 2023-03-14 DIAGNOSIS — G40909 Epilepsy, unspecified, not intractable, without status epilepticus: Secondary | ICD-10-CM | POA: Diagnosis not present

## 2023-03-14 DIAGNOSIS — I10 Essential (primary) hypertension: Secondary | ICD-10-CM | POA: Diagnosis not present

## 2023-03-14 DIAGNOSIS — K219 Gastro-esophageal reflux disease without esophagitis: Secondary | ICD-10-CM | POA: Diagnosis not present

## 2023-03-14 DIAGNOSIS — E119 Type 2 diabetes mellitus without complications: Secondary | ICD-10-CM | POA: Diagnosis not present

## 2023-03-14 DIAGNOSIS — D509 Iron deficiency anemia, unspecified: Secondary | ICD-10-CM | POA: Diagnosis not present

## 2023-03-14 DIAGNOSIS — F01A3 Vascular dementia, mild, with mood disturbance: Secondary | ICD-10-CM | POA: Diagnosis not present

## 2023-03-14 DIAGNOSIS — E669 Obesity, unspecified: Secondary | ICD-10-CM | POA: Diagnosis not present

## 2023-03-16 ENCOUNTER — Ambulatory Visit (HOSPITAL_BASED_OUTPATIENT_CLINIC_OR_DEPARTMENT_OTHER): Payer: Medicare Other | Admitting: Pulmonary Disease

## 2023-03-17 DIAGNOSIS — G629 Polyneuropathy, unspecified: Secondary | ICD-10-CM | POA: Diagnosis not present

## 2023-03-17 DIAGNOSIS — G8929 Other chronic pain: Secondary | ICD-10-CM | POA: Diagnosis not present

## 2023-03-18 DIAGNOSIS — F01A4 Vascular dementia, mild, with anxiety: Secondary | ICD-10-CM | POA: Diagnosis not present

## 2023-03-18 DIAGNOSIS — I679 Cerebrovascular disease, unspecified: Secondary | ICD-10-CM | POA: Diagnosis not present

## 2023-03-18 DIAGNOSIS — C3431 Malignant neoplasm of lower lobe, right bronchus or lung: Secondary | ICD-10-CM | POA: Diagnosis not present

## 2023-03-18 DIAGNOSIS — J449 Chronic obstructive pulmonary disease, unspecified: Secondary | ICD-10-CM | POA: Diagnosis not present

## 2023-03-18 DIAGNOSIS — I69354 Hemiplegia and hemiparesis following cerebral infarction affecting left non-dominant side: Secondary | ICD-10-CM | POA: Diagnosis not present

## 2023-03-18 DIAGNOSIS — Z9981 Dependence on supplemental oxygen: Secondary | ICD-10-CM | POA: Diagnosis not present

## 2023-03-19 DIAGNOSIS — I679 Cerebrovascular disease, unspecified: Secondary | ICD-10-CM | POA: Diagnosis not present

## 2023-03-19 DIAGNOSIS — C3431 Malignant neoplasm of lower lobe, right bronchus or lung: Secondary | ICD-10-CM | POA: Diagnosis not present

## 2023-03-19 DIAGNOSIS — Z9981 Dependence on supplemental oxygen: Secondary | ICD-10-CM | POA: Diagnosis not present

## 2023-03-19 DIAGNOSIS — F01A4 Vascular dementia, mild, with anxiety: Secondary | ICD-10-CM | POA: Diagnosis not present

## 2023-03-19 DIAGNOSIS — J449 Chronic obstructive pulmonary disease, unspecified: Secondary | ICD-10-CM | POA: Diagnosis not present

## 2023-03-19 DIAGNOSIS — I69354 Hemiplegia and hemiparesis following cerebral infarction affecting left non-dominant side: Secondary | ICD-10-CM | POA: Diagnosis not present

## 2023-03-20 DIAGNOSIS — I69354 Hemiplegia and hemiparesis following cerebral infarction affecting left non-dominant side: Secondary | ICD-10-CM | POA: Diagnosis not present

## 2023-03-20 DIAGNOSIS — C3431 Malignant neoplasm of lower lobe, right bronchus or lung: Secondary | ICD-10-CM | POA: Diagnosis not present

## 2023-03-20 DIAGNOSIS — J449 Chronic obstructive pulmonary disease, unspecified: Secondary | ICD-10-CM | POA: Diagnosis not present

## 2023-03-20 DIAGNOSIS — I679 Cerebrovascular disease, unspecified: Secondary | ICD-10-CM | POA: Diagnosis not present

## 2023-03-20 DIAGNOSIS — F01A4 Vascular dementia, mild, with anxiety: Secondary | ICD-10-CM | POA: Diagnosis not present

## 2023-03-20 DIAGNOSIS — Z9981 Dependence on supplemental oxygen: Secondary | ICD-10-CM | POA: Diagnosis not present

## 2023-03-23 DIAGNOSIS — F01A4 Vascular dementia, mild, with anxiety: Secondary | ICD-10-CM | POA: Diagnosis not present

## 2023-03-23 DIAGNOSIS — Z9981 Dependence on supplemental oxygen: Secondary | ICD-10-CM | POA: Diagnosis not present

## 2023-03-23 DIAGNOSIS — I679 Cerebrovascular disease, unspecified: Secondary | ICD-10-CM | POA: Diagnosis not present

## 2023-03-23 DIAGNOSIS — I69354 Hemiplegia and hemiparesis following cerebral infarction affecting left non-dominant side: Secondary | ICD-10-CM | POA: Diagnosis not present

## 2023-03-23 DIAGNOSIS — J449 Chronic obstructive pulmonary disease, unspecified: Secondary | ICD-10-CM | POA: Diagnosis not present

## 2023-03-23 DIAGNOSIS — F4323 Adjustment disorder with mixed anxiety and depressed mood: Secondary | ICD-10-CM | POA: Diagnosis not present

## 2023-03-23 DIAGNOSIS — C3431 Malignant neoplasm of lower lobe, right bronchus or lung: Secondary | ICD-10-CM | POA: Diagnosis not present

## 2023-03-25 DIAGNOSIS — J449 Chronic obstructive pulmonary disease, unspecified: Secondary | ICD-10-CM | POA: Diagnosis not present

## 2023-03-25 DIAGNOSIS — F01A4 Vascular dementia, mild, with anxiety: Secondary | ICD-10-CM | POA: Diagnosis not present

## 2023-03-25 DIAGNOSIS — Z9981 Dependence on supplemental oxygen: Secondary | ICD-10-CM | POA: Diagnosis not present

## 2023-03-25 DIAGNOSIS — I69354 Hemiplegia and hemiparesis following cerebral infarction affecting left non-dominant side: Secondary | ICD-10-CM | POA: Diagnosis not present

## 2023-03-25 DIAGNOSIS — C3431 Malignant neoplasm of lower lobe, right bronchus or lung: Secondary | ICD-10-CM | POA: Diagnosis not present

## 2023-03-25 DIAGNOSIS — I679 Cerebrovascular disease, unspecified: Secondary | ICD-10-CM | POA: Diagnosis not present

## 2023-03-27 DIAGNOSIS — F01A4 Vascular dementia, mild, with anxiety: Secondary | ICD-10-CM | POA: Diagnosis not present

## 2023-03-27 DIAGNOSIS — Z9981 Dependence on supplemental oxygen: Secondary | ICD-10-CM | POA: Diagnosis not present

## 2023-03-27 DIAGNOSIS — J449 Chronic obstructive pulmonary disease, unspecified: Secondary | ICD-10-CM | POA: Diagnosis not present

## 2023-03-27 DIAGNOSIS — I69354 Hemiplegia and hemiparesis following cerebral infarction affecting left non-dominant side: Secondary | ICD-10-CM | POA: Diagnosis not present

## 2023-03-27 DIAGNOSIS — I679 Cerebrovascular disease, unspecified: Secondary | ICD-10-CM | POA: Diagnosis not present

## 2023-03-27 DIAGNOSIS — C3431 Malignant neoplasm of lower lobe, right bronchus or lung: Secondary | ICD-10-CM | POA: Diagnosis not present

## 2023-03-30 DIAGNOSIS — F01A4 Vascular dementia, mild, with anxiety: Secondary | ICD-10-CM | POA: Diagnosis not present

## 2023-03-30 DIAGNOSIS — I69354 Hemiplegia and hemiparesis following cerebral infarction affecting left non-dominant side: Secondary | ICD-10-CM | POA: Diagnosis not present

## 2023-03-30 DIAGNOSIS — I679 Cerebrovascular disease, unspecified: Secondary | ICD-10-CM | POA: Diagnosis not present

## 2023-03-30 DIAGNOSIS — Z9981 Dependence on supplemental oxygen: Secondary | ICD-10-CM | POA: Diagnosis not present

## 2023-03-30 DIAGNOSIS — C3431 Malignant neoplasm of lower lobe, right bronchus or lung: Secondary | ICD-10-CM | POA: Diagnosis not present

## 2023-03-30 DIAGNOSIS — J449 Chronic obstructive pulmonary disease, unspecified: Secondary | ICD-10-CM | POA: Diagnosis not present

## 2023-04-01 DIAGNOSIS — I679 Cerebrovascular disease, unspecified: Secondary | ICD-10-CM | POA: Diagnosis not present

## 2023-04-01 DIAGNOSIS — J449 Chronic obstructive pulmonary disease, unspecified: Secondary | ICD-10-CM | POA: Diagnosis not present

## 2023-04-01 DIAGNOSIS — F01A4 Vascular dementia, mild, with anxiety: Secondary | ICD-10-CM | POA: Diagnosis not present

## 2023-04-01 DIAGNOSIS — Z9981 Dependence on supplemental oxygen: Secondary | ICD-10-CM | POA: Diagnosis not present

## 2023-04-01 DIAGNOSIS — I69354 Hemiplegia and hemiparesis following cerebral infarction affecting left non-dominant side: Secondary | ICD-10-CM | POA: Diagnosis not present

## 2023-04-01 DIAGNOSIS — C3431 Malignant neoplasm of lower lobe, right bronchus or lung: Secondary | ICD-10-CM | POA: Diagnosis not present

## 2023-04-03 DIAGNOSIS — F411 Generalized anxiety disorder: Secondary | ICD-10-CM | POA: Diagnosis not present

## 2023-04-03 DIAGNOSIS — I69354 Hemiplegia and hemiparesis following cerebral infarction affecting left non-dominant side: Secondary | ICD-10-CM | POA: Diagnosis not present

## 2023-04-03 DIAGNOSIS — I679 Cerebrovascular disease, unspecified: Secondary | ICD-10-CM | POA: Diagnosis not present

## 2023-04-03 DIAGNOSIS — Z9981 Dependence on supplemental oxygen: Secondary | ICD-10-CM | POA: Diagnosis not present

## 2023-04-03 DIAGNOSIS — F01A4 Vascular dementia, mild, with anxiety: Secondary | ICD-10-CM | POA: Diagnosis not present

## 2023-04-03 DIAGNOSIS — C3431 Malignant neoplasm of lower lobe, right bronchus or lung: Secondary | ICD-10-CM | POA: Diagnosis not present

## 2023-04-03 DIAGNOSIS — F331 Major depressive disorder, recurrent, moderate: Secondary | ICD-10-CM | POA: Diagnosis not present

## 2023-04-03 DIAGNOSIS — J449 Chronic obstructive pulmonary disease, unspecified: Secondary | ICD-10-CM | POA: Diagnosis not present

## 2023-04-06 DIAGNOSIS — C3431 Malignant neoplasm of lower lobe, right bronchus or lung: Secondary | ICD-10-CM | POA: Diagnosis not present

## 2023-04-06 DIAGNOSIS — J449 Chronic obstructive pulmonary disease, unspecified: Secondary | ICD-10-CM | POA: Diagnosis not present

## 2023-04-06 DIAGNOSIS — I69354 Hemiplegia and hemiparesis following cerebral infarction affecting left non-dominant side: Secondary | ICD-10-CM | POA: Diagnosis not present

## 2023-04-06 DIAGNOSIS — F4323 Adjustment disorder with mixed anxiety and depressed mood: Secondary | ICD-10-CM | POA: Diagnosis not present

## 2023-04-06 DIAGNOSIS — I679 Cerebrovascular disease, unspecified: Secondary | ICD-10-CM | POA: Diagnosis not present

## 2023-04-06 DIAGNOSIS — Z9981 Dependence on supplemental oxygen: Secondary | ICD-10-CM | POA: Diagnosis not present

## 2023-04-06 DIAGNOSIS — F01A4 Vascular dementia, mild, with anxiety: Secondary | ICD-10-CM | POA: Diagnosis not present

## 2023-04-08 DIAGNOSIS — Z9981 Dependence on supplemental oxygen: Secondary | ICD-10-CM | POA: Diagnosis not present

## 2023-04-08 DIAGNOSIS — J449 Chronic obstructive pulmonary disease, unspecified: Secondary | ICD-10-CM | POA: Diagnosis not present

## 2023-04-08 DIAGNOSIS — C3431 Malignant neoplasm of lower lobe, right bronchus or lung: Secondary | ICD-10-CM | POA: Diagnosis not present

## 2023-04-08 DIAGNOSIS — I69354 Hemiplegia and hemiparesis following cerebral infarction affecting left non-dominant side: Secondary | ICD-10-CM | POA: Diagnosis not present

## 2023-04-08 DIAGNOSIS — F01A4 Vascular dementia, mild, with anxiety: Secondary | ICD-10-CM | POA: Diagnosis not present

## 2023-04-08 DIAGNOSIS — I679 Cerebrovascular disease, unspecified: Secondary | ICD-10-CM | POA: Diagnosis not present

## 2023-04-09 DIAGNOSIS — J309 Allergic rhinitis, unspecified: Secondary | ICD-10-CM | POA: Diagnosis not present

## 2023-04-09 DIAGNOSIS — G4700 Insomnia, unspecified: Secondary | ICD-10-CM | POA: Diagnosis not present

## 2023-04-09 DIAGNOSIS — G40909 Epilepsy, unspecified, not intractable, without status epilepticus: Secondary | ICD-10-CM | POA: Diagnosis not present

## 2023-04-09 DIAGNOSIS — D649 Anemia, unspecified: Secondary | ICD-10-CM | POA: Diagnosis not present

## 2023-04-09 DIAGNOSIS — K5900 Constipation, unspecified: Secondary | ICD-10-CM | POA: Diagnosis not present

## 2023-04-10 DIAGNOSIS — Z9981 Dependence on supplemental oxygen: Secondary | ICD-10-CM | POA: Diagnosis not present

## 2023-04-10 DIAGNOSIS — I679 Cerebrovascular disease, unspecified: Secondary | ICD-10-CM | POA: Diagnosis not present

## 2023-04-10 DIAGNOSIS — C3431 Malignant neoplasm of lower lobe, right bronchus or lung: Secondary | ICD-10-CM | POA: Diagnosis not present

## 2023-04-10 DIAGNOSIS — I69354 Hemiplegia and hemiparesis following cerebral infarction affecting left non-dominant side: Secondary | ICD-10-CM | POA: Diagnosis not present

## 2023-04-10 DIAGNOSIS — F01A4 Vascular dementia, mild, with anxiety: Secondary | ICD-10-CM | POA: Diagnosis not present

## 2023-04-10 DIAGNOSIS — J449 Chronic obstructive pulmonary disease, unspecified: Secondary | ICD-10-CM | POA: Diagnosis not present

## 2023-04-14 DIAGNOSIS — F01A4 Vascular dementia, mild, with anxiety: Secondary | ICD-10-CM | POA: Diagnosis not present

## 2023-04-14 DIAGNOSIS — E669 Obesity, unspecified: Secondary | ICD-10-CM | POA: Diagnosis not present

## 2023-04-14 DIAGNOSIS — Z9181 History of falling: Secondary | ICD-10-CM | POA: Diagnosis not present

## 2023-04-14 DIAGNOSIS — D509 Iron deficiency anemia, unspecified: Secondary | ICD-10-CM | POA: Diagnosis not present

## 2023-04-14 DIAGNOSIS — I679 Cerebrovascular disease, unspecified: Secondary | ICD-10-CM | POA: Diagnosis not present

## 2023-04-14 DIAGNOSIS — J449 Chronic obstructive pulmonary disease, unspecified: Secondary | ICD-10-CM | POA: Diagnosis not present

## 2023-04-14 DIAGNOSIS — Z1501 Genetic susceptibility to malignant neoplasm of breast: Secondary | ICD-10-CM | POA: Diagnosis not present

## 2023-04-14 DIAGNOSIS — J309 Allergic rhinitis, unspecified: Secondary | ICD-10-CM | POA: Diagnosis not present

## 2023-04-14 DIAGNOSIS — M47895 Other spondylosis, thoracolumbar region: Secondary | ICD-10-CM | POA: Diagnosis not present

## 2023-04-14 DIAGNOSIS — I10 Essential (primary) hypertension: Secondary | ICD-10-CM | POA: Diagnosis not present

## 2023-04-14 DIAGNOSIS — I69354 Hemiplegia and hemiparesis following cerebral infarction affecting left non-dominant side: Secondary | ICD-10-CM | POA: Diagnosis not present

## 2023-04-14 DIAGNOSIS — K219 Gastro-esophageal reflux disease without esophagitis: Secondary | ICD-10-CM | POA: Diagnosis not present

## 2023-04-14 DIAGNOSIS — C3431 Malignant neoplasm of lower lobe, right bronchus or lung: Secondary | ICD-10-CM | POA: Diagnosis not present

## 2023-04-14 DIAGNOSIS — E119 Type 2 diabetes mellitus without complications: Secondary | ICD-10-CM | POA: Diagnosis not present

## 2023-04-14 DIAGNOSIS — G40909 Epilepsy, unspecified, not intractable, without status epilepticus: Secondary | ICD-10-CM | POA: Diagnosis not present

## 2023-04-14 DIAGNOSIS — G8929 Other chronic pain: Secondary | ICD-10-CM | POA: Diagnosis not present

## 2023-04-14 DIAGNOSIS — E785 Hyperlipidemia, unspecified: Secondary | ICD-10-CM | POA: Diagnosis not present

## 2023-04-14 DIAGNOSIS — R202 Paresthesia of skin: Secondary | ICD-10-CM | POA: Diagnosis not present

## 2023-04-14 DIAGNOSIS — Z9981 Dependence on supplemental oxygen: Secondary | ICD-10-CM | POA: Diagnosis not present

## 2023-04-14 DIAGNOSIS — Z8679 Personal history of other diseases of the circulatory system: Secondary | ICD-10-CM | POA: Diagnosis not present

## 2023-04-14 DIAGNOSIS — G629 Polyneuropathy, unspecified: Secondary | ICD-10-CM | POA: Diagnosis not present

## 2023-04-14 DIAGNOSIS — Z8701 Personal history of pneumonia (recurrent): Secondary | ICD-10-CM | POA: Diagnosis not present

## 2023-04-14 DIAGNOSIS — I482 Chronic atrial fibrillation, unspecified: Secondary | ICD-10-CM | POA: Diagnosis not present

## 2023-04-14 DIAGNOSIS — F01A3 Vascular dementia, mild, with mood disturbance: Secondary | ICD-10-CM | POA: Diagnosis not present

## 2023-04-14 DIAGNOSIS — Z87891 Personal history of nicotine dependence: Secondary | ICD-10-CM | POA: Diagnosis not present

## 2023-04-14 DIAGNOSIS — R600 Localized edema: Secondary | ICD-10-CM | POA: Diagnosis not present

## 2023-04-14 DIAGNOSIS — K5732 Diverticulitis of large intestine without perforation or abscess without bleeding: Secondary | ICD-10-CM | POA: Diagnosis not present

## 2023-04-15 DIAGNOSIS — J449 Chronic obstructive pulmonary disease, unspecified: Secondary | ICD-10-CM | POA: Diagnosis not present

## 2023-04-15 DIAGNOSIS — C3431 Malignant neoplasm of lower lobe, right bronchus or lung: Secondary | ICD-10-CM | POA: Diagnosis not present

## 2023-04-15 DIAGNOSIS — I679 Cerebrovascular disease, unspecified: Secondary | ICD-10-CM | POA: Diagnosis not present

## 2023-04-15 DIAGNOSIS — F01A4 Vascular dementia, mild, with anxiety: Secondary | ICD-10-CM | POA: Diagnosis not present

## 2023-04-15 DIAGNOSIS — I69354 Hemiplegia and hemiparesis following cerebral infarction affecting left non-dominant side: Secondary | ICD-10-CM | POA: Diagnosis not present

## 2023-04-15 DIAGNOSIS — Z9981 Dependence on supplemental oxygen: Secondary | ICD-10-CM | POA: Diagnosis not present

## 2023-04-17 DIAGNOSIS — F01A4 Vascular dementia, mild, with anxiety: Secondary | ICD-10-CM | POA: Diagnosis not present

## 2023-04-17 DIAGNOSIS — C3431 Malignant neoplasm of lower lobe, right bronchus or lung: Secondary | ICD-10-CM | POA: Diagnosis not present

## 2023-04-17 DIAGNOSIS — I69354 Hemiplegia and hemiparesis following cerebral infarction affecting left non-dominant side: Secondary | ICD-10-CM | POA: Diagnosis not present

## 2023-04-17 DIAGNOSIS — Z9981 Dependence on supplemental oxygen: Secondary | ICD-10-CM | POA: Diagnosis not present

## 2023-04-17 DIAGNOSIS — I679 Cerebrovascular disease, unspecified: Secondary | ICD-10-CM | POA: Diagnosis not present

## 2023-04-17 DIAGNOSIS — J449 Chronic obstructive pulmonary disease, unspecified: Secondary | ICD-10-CM | POA: Diagnosis not present

## 2023-04-20 DIAGNOSIS — F4323 Adjustment disorder with mixed anxiety and depressed mood: Secondary | ICD-10-CM | POA: Diagnosis not present

## 2023-04-20 DIAGNOSIS — C3431 Malignant neoplasm of lower lobe, right bronchus or lung: Secondary | ICD-10-CM | POA: Diagnosis not present

## 2023-04-20 DIAGNOSIS — J449 Chronic obstructive pulmonary disease, unspecified: Secondary | ICD-10-CM | POA: Diagnosis not present

## 2023-04-20 DIAGNOSIS — Z9981 Dependence on supplemental oxygen: Secondary | ICD-10-CM | POA: Diagnosis not present

## 2023-04-20 DIAGNOSIS — I69354 Hemiplegia and hemiparesis following cerebral infarction affecting left non-dominant side: Secondary | ICD-10-CM | POA: Diagnosis not present

## 2023-04-20 DIAGNOSIS — I679 Cerebrovascular disease, unspecified: Secondary | ICD-10-CM | POA: Diagnosis not present

## 2023-04-20 DIAGNOSIS — F01A4 Vascular dementia, mild, with anxiety: Secondary | ICD-10-CM | POA: Diagnosis not present

## 2023-04-22 DIAGNOSIS — F01A4 Vascular dementia, mild, with anxiety: Secondary | ICD-10-CM | POA: Diagnosis not present

## 2023-04-22 DIAGNOSIS — I69354 Hemiplegia and hemiparesis following cerebral infarction affecting left non-dominant side: Secondary | ICD-10-CM | POA: Diagnosis not present

## 2023-04-22 DIAGNOSIS — J449 Chronic obstructive pulmonary disease, unspecified: Secondary | ICD-10-CM | POA: Diagnosis not present

## 2023-04-22 DIAGNOSIS — C3431 Malignant neoplasm of lower lobe, right bronchus or lung: Secondary | ICD-10-CM | POA: Diagnosis not present

## 2023-04-22 DIAGNOSIS — I679 Cerebrovascular disease, unspecified: Secondary | ICD-10-CM | POA: Diagnosis not present

## 2023-04-22 DIAGNOSIS — Z9981 Dependence on supplemental oxygen: Secondary | ICD-10-CM | POA: Diagnosis not present

## 2023-04-24 DIAGNOSIS — Z9981 Dependence on supplemental oxygen: Secondary | ICD-10-CM | POA: Diagnosis not present

## 2023-04-24 DIAGNOSIS — I679 Cerebrovascular disease, unspecified: Secondary | ICD-10-CM | POA: Diagnosis not present

## 2023-04-24 DIAGNOSIS — J449 Chronic obstructive pulmonary disease, unspecified: Secondary | ICD-10-CM | POA: Diagnosis not present

## 2023-04-24 DIAGNOSIS — F01A4 Vascular dementia, mild, with anxiety: Secondary | ICD-10-CM | POA: Diagnosis not present

## 2023-04-24 DIAGNOSIS — C3431 Malignant neoplasm of lower lobe, right bronchus or lung: Secondary | ICD-10-CM | POA: Diagnosis not present

## 2023-04-24 DIAGNOSIS — I69354 Hemiplegia and hemiparesis following cerebral infarction affecting left non-dominant side: Secondary | ICD-10-CM | POA: Diagnosis not present

## 2023-04-27 DIAGNOSIS — J449 Chronic obstructive pulmonary disease, unspecified: Secondary | ICD-10-CM | POA: Diagnosis not present

## 2023-04-27 DIAGNOSIS — Z9981 Dependence on supplemental oxygen: Secondary | ICD-10-CM | POA: Diagnosis not present

## 2023-04-27 DIAGNOSIS — C3431 Malignant neoplasm of lower lobe, right bronchus or lung: Secondary | ICD-10-CM | POA: Diagnosis not present

## 2023-04-27 DIAGNOSIS — F01A4 Vascular dementia, mild, with anxiety: Secondary | ICD-10-CM | POA: Diagnosis not present

## 2023-04-27 DIAGNOSIS — I679 Cerebrovascular disease, unspecified: Secondary | ICD-10-CM | POA: Diagnosis not present

## 2023-04-27 DIAGNOSIS — I69354 Hemiplegia and hemiparesis following cerebral infarction affecting left non-dominant side: Secondary | ICD-10-CM | POA: Diagnosis not present

## 2023-04-29 DIAGNOSIS — J449 Chronic obstructive pulmonary disease, unspecified: Secondary | ICD-10-CM | POA: Diagnosis not present

## 2023-04-29 DIAGNOSIS — I69354 Hemiplegia and hemiparesis following cerebral infarction affecting left non-dominant side: Secondary | ICD-10-CM | POA: Diagnosis not present

## 2023-04-29 DIAGNOSIS — C3431 Malignant neoplasm of lower lobe, right bronchus or lung: Secondary | ICD-10-CM | POA: Diagnosis not present

## 2023-04-29 DIAGNOSIS — I679 Cerebrovascular disease, unspecified: Secondary | ICD-10-CM | POA: Diagnosis not present

## 2023-04-29 DIAGNOSIS — F01A4 Vascular dementia, mild, with anxiety: Secondary | ICD-10-CM | POA: Diagnosis not present

## 2023-04-29 DIAGNOSIS — Z9981 Dependence on supplemental oxygen: Secondary | ICD-10-CM | POA: Diagnosis not present

## 2023-05-01 DIAGNOSIS — F331 Major depressive disorder, recurrent, moderate: Secondary | ICD-10-CM | POA: Diagnosis not present

## 2023-05-01 DIAGNOSIS — F411 Generalized anxiety disorder: Secondary | ICD-10-CM | POA: Diagnosis not present

## 2023-05-02 DIAGNOSIS — F01A4 Vascular dementia, mild, with anxiety: Secondary | ICD-10-CM | POA: Diagnosis not present

## 2023-05-02 DIAGNOSIS — I679 Cerebrovascular disease, unspecified: Secondary | ICD-10-CM | POA: Diagnosis not present

## 2023-05-02 DIAGNOSIS — C3431 Malignant neoplasm of lower lobe, right bronchus or lung: Secondary | ICD-10-CM | POA: Diagnosis not present

## 2023-05-02 DIAGNOSIS — J449 Chronic obstructive pulmonary disease, unspecified: Secondary | ICD-10-CM | POA: Diagnosis not present

## 2023-05-02 DIAGNOSIS — I69354 Hemiplegia and hemiparesis following cerebral infarction affecting left non-dominant side: Secondary | ICD-10-CM | POA: Diagnosis not present

## 2023-05-02 DIAGNOSIS — Z9981 Dependence on supplemental oxygen: Secondary | ICD-10-CM | POA: Diagnosis not present

## 2023-05-04 DIAGNOSIS — I69354 Hemiplegia and hemiparesis following cerebral infarction affecting left non-dominant side: Secondary | ICD-10-CM | POA: Diagnosis not present

## 2023-05-04 DIAGNOSIS — J449 Chronic obstructive pulmonary disease, unspecified: Secondary | ICD-10-CM | POA: Diagnosis not present

## 2023-05-04 DIAGNOSIS — C3431 Malignant neoplasm of lower lobe, right bronchus or lung: Secondary | ICD-10-CM | POA: Diagnosis not present

## 2023-05-04 DIAGNOSIS — Z9981 Dependence on supplemental oxygen: Secondary | ICD-10-CM | POA: Diagnosis not present

## 2023-05-04 DIAGNOSIS — F01A4 Vascular dementia, mild, with anxiety: Secondary | ICD-10-CM | POA: Diagnosis not present

## 2023-05-04 DIAGNOSIS — F4323 Adjustment disorder with mixed anxiety and depressed mood: Secondary | ICD-10-CM | POA: Diagnosis not present

## 2023-05-04 DIAGNOSIS — I679 Cerebrovascular disease, unspecified: Secondary | ICD-10-CM | POA: Diagnosis not present

## 2023-05-06 DIAGNOSIS — Z9981 Dependence on supplemental oxygen: Secondary | ICD-10-CM | POA: Diagnosis not present

## 2023-05-06 DIAGNOSIS — I69354 Hemiplegia and hemiparesis following cerebral infarction affecting left non-dominant side: Secondary | ICD-10-CM | POA: Diagnosis not present

## 2023-05-06 DIAGNOSIS — C3431 Malignant neoplasm of lower lobe, right bronchus or lung: Secondary | ICD-10-CM | POA: Diagnosis not present

## 2023-05-06 DIAGNOSIS — I679 Cerebrovascular disease, unspecified: Secondary | ICD-10-CM | POA: Diagnosis not present

## 2023-05-06 DIAGNOSIS — F01A4 Vascular dementia, mild, with anxiety: Secondary | ICD-10-CM | POA: Diagnosis not present

## 2023-05-06 DIAGNOSIS — J449 Chronic obstructive pulmonary disease, unspecified: Secondary | ICD-10-CM | POA: Diagnosis not present

## 2023-05-08 DIAGNOSIS — F01A4 Vascular dementia, mild, with anxiety: Secondary | ICD-10-CM | POA: Diagnosis not present

## 2023-05-08 DIAGNOSIS — J449 Chronic obstructive pulmonary disease, unspecified: Secondary | ICD-10-CM | POA: Diagnosis not present

## 2023-05-08 DIAGNOSIS — Z9981 Dependence on supplemental oxygen: Secondary | ICD-10-CM | POA: Diagnosis not present

## 2023-05-08 DIAGNOSIS — I69354 Hemiplegia and hemiparesis following cerebral infarction affecting left non-dominant side: Secondary | ICD-10-CM | POA: Diagnosis not present

## 2023-05-08 DIAGNOSIS — I679 Cerebrovascular disease, unspecified: Secondary | ICD-10-CM | POA: Diagnosis not present

## 2023-05-08 DIAGNOSIS — C3431 Malignant neoplasm of lower lobe, right bronchus or lung: Secondary | ICD-10-CM | POA: Diagnosis not present

## 2023-05-12 DIAGNOSIS — C3411 Malignant neoplasm of upper lobe, right bronchus or lung: Secondary | ICD-10-CM | POA: Diagnosis not present

## 2023-05-12 DIAGNOSIS — I10 Essential (primary) hypertension: Secondary | ICD-10-CM | POA: Diagnosis not present

## 2023-05-12 DIAGNOSIS — F331 Major depressive disorder, recurrent, moderate: Secondary | ICD-10-CM | POA: Diagnosis not present

## 2023-05-12 DIAGNOSIS — J449 Chronic obstructive pulmonary disease, unspecified: Secondary | ICD-10-CM | POA: Diagnosis not present

## 2023-05-12 DIAGNOSIS — Z9981 Dependence on supplemental oxygen: Secondary | ICD-10-CM | POA: Diagnosis not present

## 2023-05-12 DIAGNOSIS — F411 Generalized anxiety disorder: Secondary | ICD-10-CM | POA: Diagnosis not present

## 2023-05-12 DIAGNOSIS — Z515 Encounter for palliative care: Secondary | ICD-10-CM | POA: Diagnosis not present

## 2023-05-12 DIAGNOSIS — J9610 Chronic respiratory failure, unspecified whether with hypoxia or hypercapnia: Secondary | ICD-10-CM | POA: Diagnosis not present

## 2023-05-12 DIAGNOSIS — J309 Allergic rhinitis, unspecified: Secondary | ICD-10-CM | POA: Diagnosis not present

## 2023-05-13 DIAGNOSIS — I679 Cerebrovascular disease, unspecified: Secondary | ICD-10-CM | POA: Diagnosis not present

## 2023-05-13 DIAGNOSIS — Z9981 Dependence on supplemental oxygen: Secondary | ICD-10-CM | POA: Diagnosis not present

## 2023-05-13 DIAGNOSIS — J449 Chronic obstructive pulmonary disease, unspecified: Secondary | ICD-10-CM | POA: Diagnosis not present

## 2023-05-13 DIAGNOSIS — F01A4 Vascular dementia, mild, with anxiety: Secondary | ICD-10-CM | POA: Diagnosis not present

## 2023-05-13 DIAGNOSIS — C3431 Malignant neoplasm of lower lobe, right bronchus or lung: Secondary | ICD-10-CM | POA: Diagnosis not present

## 2023-05-13 DIAGNOSIS — I69354 Hemiplegia and hemiparesis following cerebral infarction affecting left non-dominant side: Secondary | ICD-10-CM | POA: Diagnosis not present

## 2023-05-14 DIAGNOSIS — F01A4 Vascular dementia, mild, with anxiety: Secondary | ICD-10-CM | POA: Diagnosis not present

## 2023-05-14 DIAGNOSIS — K5732 Diverticulitis of large intestine without perforation or abscess without bleeding: Secondary | ICD-10-CM | POA: Diagnosis not present

## 2023-05-14 DIAGNOSIS — Z87891 Personal history of nicotine dependence: Secondary | ICD-10-CM | POA: Diagnosis not present

## 2023-05-14 DIAGNOSIS — E119 Type 2 diabetes mellitus without complications: Secondary | ICD-10-CM | POA: Diagnosis not present

## 2023-05-14 DIAGNOSIS — J309 Allergic rhinitis, unspecified: Secondary | ICD-10-CM | POA: Diagnosis not present

## 2023-05-14 DIAGNOSIS — E785 Hyperlipidemia, unspecified: Secondary | ICD-10-CM | POA: Diagnosis not present

## 2023-05-14 DIAGNOSIS — Z1501 Genetic susceptibility to malignant neoplasm of breast: Secondary | ICD-10-CM | POA: Diagnosis not present

## 2023-05-14 DIAGNOSIS — I10 Essential (primary) hypertension: Secondary | ICD-10-CM | POA: Diagnosis not present

## 2023-05-14 DIAGNOSIS — E669 Obesity, unspecified: Secondary | ICD-10-CM | POA: Diagnosis not present

## 2023-05-14 DIAGNOSIS — I69354 Hemiplegia and hemiparesis following cerebral infarction affecting left non-dominant side: Secondary | ICD-10-CM | POA: Diagnosis not present

## 2023-05-14 DIAGNOSIS — M47895 Other spondylosis, thoracolumbar region: Secondary | ICD-10-CM | POA: Diagnosis not present

## 2023-05-14 DIAGNOSIS — G40909 Epilepsy, unspecified, not intractable, without status epilepticus: Secondary | ICD-10-CM | POA: Diagnosis not present

## 2023-05-14 DIAGNOSIS — F01A3 Vascular dementia, mild, with mood disturbance: Secondary | ICD-10-CM | POA: Diagnosis not present

## 2023-05-14 DIAGNOSIS — I679 Cerebrovascular disease, unspecified: Secondary | ICD-10-CM | POA: Diagnosis not present

## 2023-05-14 DIAGNOSIS — C3431 Malignant neoplasm of lower lobe, right bronchus or lung: Secondary | ICD-10-CM | POA: Diagnosis not present

## 2023-05-14 DIAGNOSIS — K219 Gastro-esophageal reflux disease without esophagitis: Secondary | ICD-10-CM | POA: Diagnosis not present

## 2023-05-14 DIAGNOSIS — Z8701 Personal history of pneumonia (recurrent): Secondary | ICD-10-CM | POA: Diagnosis not present

## 2023-05-14 DIAGNOSIS — I482 Chronic atrial fibrillation, unspecified: Secondary | ICD-10-CM | POA: Diagnosis not present

## 2023-05-14 DIAGNOSIS — Z9981 Dependence on supplemental oxygen: Secondary | ICD-10-CM | POA: Diagnosis not present

## 2023-05-14 DIAGNOSIS — D509 Iron deficiency anemia, unspecified: Secondary | ICD-10-CM | POA: Diagnosis not present

## 2023-05-14 DIAGNOSIS — R202 Paresthesia of skin: Secondary | ICD-10-CM | POA: Diagnosis not present

## 2023-05-14 DIAGNOSIS — Z9181 History of falling: Secondary | ICD-10-CM | POA: Diagnosis not present

## 2023-05-14 DIAGNOSIS — Z8679 Personal history of other diseases of the circulatory system: Secondary | ICD-10-CM | POA: Diagnosis not present

## 2023-05-14 DIAGNOSIS — J449 Chronic obstructive pulmonary disease, unspecified: Secondary | ICD-10-CM | POA: Diagnosis not present

## 2023-05-15 DIAGNOSIS — I679 Cerebrovascular disease, unspecified: Secondary | ICD-10-CM | POA: Diagnosis not present

## 2023-05-15 DIAGNOSIS — J449 Chronic obstructive pulmonary disease, unspecified: Secondary | ICD-10-CM | POA: Diagnosis not present

## 2023-05-15 DIAGNOSIS — Z9981 Dependence on supplemental oxygen: Secondary | ICD-10-CM | POA: Diagnosis not present

## 2023-05-15 DIAGNOSIS — F01A4 Vascular dementia, mild, with anxiety: Secondary | ICD-10-CM | POA: Diagnosis not present

## 2023-05-15 DIAGNOSIS — I69354 Hemiplegia and hemiparesis following cerebral infarction affecting left non-dominant side: Secondary | ICD-10-CM | POA: Diagnosis not present

## 2023-05-15 DIAGNOSIS — C3431 Malignant neoplasm of lower lobe, right bronchus or lung: Secondary | ICD-10-CM | POA: Diagnosis not present

## 2023-05-18 DIAGNOSIS — I679 Cerebrovascular disease, unspecified: Secondary | ICD-10-CM | POA: Diagnosis not present

## 2023-05-18 DIAGNOSIS — F4323 Adjustment disorder with mixed anxiety and depressed mood: Secondary | ICD-10-CM | POA: Diagnosis not present

## 2023-05-18 DIAGNOSIS — C3431 Malignant neoplasm of lower lobe, right bronchus or lung: Secondary | ICD-10-CM | POA: Diagnosis not present

## 2023-05-18 DIAGNOSIS — Z9981 Dependence on supplemental oxygen: Secondary | ICD-10-CM | POA: Diagnosis not present

## 2023-05-18 DIAGNOSIS — J449 Chronic obstructive pulmonary disease, unspecified: Secondary | ICD-10-CM | POA: Diagnosis not present

## 2023-05-18 DIAGNOSIS — F01A4 Vascular dementia, mild, with anxiety: Secondary | ICD-10-CM | POA: Diagnosis not present

## 2023-05-18 DIAGNOSIS — I69354 Hemiplegia and hemiparesis following cerebral infarction affecting left non-dominant side: Secondary | ICD-10-CM | POA: Diagnosis not present

## 2023-05-19 DIAGNOSIS — R300 Dysuria: Secondary | ICD-10-CM | POA: Diagnosis not present

## 2023-05-19 DIAGNOSIS — G8929 Other chronic pain: Secondary | ICD-10-CM | POA: Diagnosis not present

## 2023-05-19 DIAGNOSIS — R5383 Other fatigue: Secondary | ICD-10-CM | POA: Diagnosis not present

## 2023-05-19 DIAGNOSIS — G629 Polyneuropathy, unspecified: Secondary | ICD-10-CM | POA: Diagnosis not present

## 2023-05-20 DIAGNOSIS — Z9981 Dependence on supplemental oxygen: Secondary | ICD-10-CM | POA: Diagnosis not present

## 2023-05-20 DIAGNOSIS — F01A4 Vascular dementia, mild, with anxiety: Secondary | ICD-10-CM | POA: Diagnosis not present

## 2023-05-20 DIAGNOSIS — I69354 Hemiplegia and hemiparesis following cerebral infarction affecting left non-dominant side: Secondary | ICD-10-CM | POA: Diagnosis not present

## 2023-05-20 DIAGNOSIS — C3431 Malignant neoplasm of lower lobe, right bronchus or lung: Secondary | ICD-10-CM | POA: Diagnosis not present

## 2023-05-20 DIAGNOSIS — J449 Chronic obstructive pulmonary disease, unspecified: Secondary | ICD-10-CM | POA: Diagnosis not present

## 2023-05-20 DIAGNOSIS — I679 Cerebrovascular disease, unspecified: Secondary | ICD-10-CM | POA: Diagnosis not present

## 2023-05-21 DIAGNOSIS — R5383 Other fatigue: Secondary | ICD-10-CM | POA: Diagnosis not present

## 2023-05-21 DIAGNOSIS — R300 Dysuria: Secondary | ICD-10-CM | POA: Diagnosis not present

## 2023-05-22 DIAGNOSIS — Z9981 Dependence on supplemental oxygen: Secondary | ICD-10-CM | POA: Diagnosis not present

## 2023-05-22 DIAGNOSIS — R300 Dysuria: Secondary | ICD-10-CM | POA: Diagnosis not present

## 2023-05-22 DIAGNOSIS — F01A4 Vascular dementia, mild, with anxiety: Secondary | ICD-10-CM | POA: Diagnosis not present

## 2023-05-22 DIAGNOSIS — I679 Cerebrovascular disease, unspecified: Secondary | ICD-10-CM | POA: Diagnosis not present

## 2023-05-22 DIAGNOSIS — J449 Chronic obstructive pulmonary disease, unspecified: Secondary | ICD-10-CM | POA: Diagnosis not present

## 2023-05-22 DIAGNOSIS — C3431 Malignant neoplasm of lower lobe, right bronchus or lung: Secondary | ICD-10-CM | POA: Diagnosis not present

## 2023-05-22 DIAGNOSIS — I69354 Hemiplegia and hemiparesis following cerebral infarction affecting left non-dominant side: Secondary | ICD-10-CM | POA: Diagnosis not present

## 2023-05-22 DIAGNOSIS — F331 Major depressive disorder, recurrent, moderate: Secondary | ICD-10-CM | POA: Diagnosis not present

## 2023-05-22 DIAGNOSIS — F411 Generalized anxiety disorder: Secondary | ICD-10-CM | POA: Diagnosis not present

## 2023-05-26 DIAGNOSIS — M79675 Pain in left toe(s): Secondary | ICD-10-CM | POA: Diagnosis not present

## 2023-05-26 DIAGNOSIS — E119 Type 2 diabetes mellitus without complications: Secondary | ICD-10-CM | POA: Diagnosis not present

## 2023-05-26 DIAGNOSIS — B351 Tinea unguium: Secondary | ICD-10-CM | POA: Diagnosis not present

## 2023-05-26 DIAGNOSIS — L03032 Cellulitis of left toe: Secondary | ICD-10-CM | POA: Diagnosis not present

## 2023-05-26 DIAGNOSIS — S90112A Contusion of left great toe without damage to nail, initial encounter: Secondary | ICD-10-CM | POA: Diagnosis not present

## 2023-05-27 DIAGNOSIS — I679 Cerebrovascular disease, unspecified: Secondary | ICD-10-CM | POA: Diagnosis not present

## 2023-05-27 DIAGNOSIS — Z9981 Dependence on supplemental oxygen: Secondary | ICD-10-CM | POA: Diagnosis not present

## 2023-05-27 DIAGNOSIS — F01A4 Vascular dementia, mild, with anxiety: Secondary | ICD-10-CM | POA: Diagnosis not present

## 2023-05-27 DIAGNOSIS — C3431 Malignant neoplasm of lower lobe, right bronchus or lung: Secondary | ICD-10-CM | POA: Diagnosis not present

## 2023-05-27 DIAGNOSIS — I69354 Hemiplegia and hemiparesis following cerebral infarction affecting left non-dominant side: Secondary | ICD-10-CM | POA: Diagnosis not present

## 2023-05-27 DIAGNOSIS — J449 Chronic obstructive pulmonary disease, unspecified: Secondary | ICD-10-CM | POA: Diagnosis not present

## 2023-05-28 ENCOUNTER — Ambulatory Visit: Payer: Medicare Other | Admitting: Adult Health

## 2023-05-29 DIAGNOSIS — Z9981 Dependence on supplemental oxygen: Secondary | ICD-10-CM | POA: Diagnosis not present

## 2023-05-29 DIAGNOSIS — J449 Chronic obstructive pulmonary disease, unspecified: Secondary | ICD-10-CM | POA: Diagnosis not present

## 2023-05-29 DIAGNOSIS — I69354 Hemiplegia and hemiparesis following cerebral infarction affecting left non-dominant side: Secondary | ICD-10-CM | POA: Diagnosis not present

## 2023-05-29 DIAGNOSIS — C3431 Malignant neoplasm of lower lobe, right bronchus or lung: Secondary | ICD-10-CM | POA: Diagnosis not present

## 2023-05-29 DIAGNOSIS — I679 Cerebrovascular disease, unspecified: Secondary | ICD-10-CM | POA: Diagnosis not present

## 2023-05-29 DIAGNOSIS — F01A4 Vascular dementia, mild, with anxiety: Secondary | ICD-10-CM | POA: Diagnosis not present

## 2023-06-01 DIAGNOSIS — Z9981 Dependence on supplemental oxygen: Secondary | ICD-10-CM | POA: Diagnosis not present

## 2023-06-01 DIAGNOSIS — I69354 Hemiplegia and hemiparesis following cerebral infarction affecting left non-dominant side: Secondary | ICD-10-CM | POA: Diagnosis not present

## 2023-06-01 DIAGNOSIS — C3431 Malignant neoplasm of lower lobe, right bronchus or lung: Secondary | ICD-10-CM | POA: Diagnosis not present

## 2023-06-01 DIAGNOSIS — I679 Cerebrovascular disease, unspecified: Secondary | ICD-10-CM | POA: Diagnosis not present

## 2023-06-01 DIAGNOSIS — J449 Chronic obstructive pulmonary disease, unspecified: Secondary | ICD-10-CM | POA: Diagnosis not present

## 2023-06-01 DIAGNOSIS — F01A4 Vascular dementia, mild, with anxiety: Secondary | ICD-10-CM | POA: Diagnosis not present

## 2023-06-03 DIAGNOSIS — F01A4 Vascular dementia, mild, with anxiety: Secondary | ICD-10-CM | POA: Diagnosis not present

## 2023-06-03 DIAGNOSIS — I69354 Hemiplegia and hemiparesis following cerebral infarction affecting left non-dominant side: Secondary | ICD-10-CM | POA: Diagnosis not present

## 2023-06-03 DIAGNOSIS — J449 Chronic obstructive pulmonary disease, unspecified: Secondary | ICD-10-CM | POA: Diagnosis not present

## 2023-06-03 DIAGNOSIS — C3431 Malignant neoplasm of lower lobe, right bronchus or lung: Secondary | ICD-10-CM | POA: Diagnosis not present

## 2023-06-03 DIAGNOSIS — I679 Cerebrovascular disease, unspecified: Secondary | ICD-10-CM | POA: Diagnosis not present

## 2023-06-03 DIAGNOSIS — Z9981 Dependence on supplemental oxygen: Secondary | ICD-10-CM | POA: Diagnosis not present

## 2023-06-08 DIAGNOSIS — I679 Cerebrovascular disease, unspecified: Secondary | ICD-10-CM | POA: Diagnosis not present

## 2023-06-08 DIAGNOSIS — C3431 Malignant neoplasm of lower lobe, right bronchus or lung: Secondary | ICD-10-CM | POA: Diagnosis not present

## 2023-06-08 DIAGNOSIS — J449 Chronic obstructive pulmonary disease, unspecified: Secondary | ICD-10-CM | POA: Diagnosis not present

## 2023-06-08 DIAGNOSIS — F01A4 Vascular dementia, mild, with anxiety: Secondary | ICD-10-CM | POA: Diagnosis not present

## 2023-06-08 DIAGNOSIS — Z9981 Dependence on supplemental oxygen: Secondary | ICD-10-CM | POA: Diagnosis not present

## 2023-06-08 DIAGNOSIS — I69354 Hemiplegia and hemiparesis following cerebral infarction affecting left non-dominant side: Secondary | ICD-10-CM | POA: Diagnosis not present

## 2023-06-09 DIAGNOSIS — K5900 Constipation, unspecified: Secondary | ICD-10-CM | POA: Diagnosis not present

## 2023-06-09 DIAGNOSIS — G40909 Epilepsy, unspecified, not intractable, without status epilepticus: Secondary | ICD-10-CM | POA: Diagnosis not present

## 2023-06-09 DIAGNOSIS — E119 Type 2 diabetes mellitus without complications: Secondary | ICD-10-CM | POA: Diagnosis not present

## 2023-06-10 DIAGNOSIS — J449 Chronic obstructive pulmonary disease, unspecified: Secondary | ICD-10-CM | POA: Diagnosis not present

## 2023-06-10 DIAGNOSIS — I679 Cerebrovascular disease, unspecified: Secondary | ICD-10-CM | POA: Diagnosis not present

## 2023-06-10 DIAGNOSIS — Z9981 Dependence on supplemental oxygen: Secondary | ICD-10-CM | POA: Diagnosis not present

## 2023-06-10 DIAGNOSIS — F01A4 Vascular dementia, mild, with anxiety: Secondary | ICD-10-CM | POA: Diagnosis not present

## 2023-06-10 DIAGNOSIS — I69354 Hemiplegia and hemiparesis following cerebral infarction affecting left non-dominant side: Secondary | ICD-10-CM | POA: Diagnosis not present

## 2023-06-10 DIAGNOSIS — C3431 Malignant neoplasm of lower lobe, right bronchus or lung: Secondary | ICD-10-CM | POA: Diagnosis not present

## 2023-06-12 DIAGNOSIS — J449 Chronic obstructive pulmonary disease, unspecified: Secondary | ICD-10-CM | POA: Diagnosis not present

## 2023-06-12 DIAGNOSIS — I679 Cerebrovascular disease, unspecified: Secondary | ICD-10-CM | POA: Diagnosis not present

## 2023-06-12 DIAGNOSIS — C3431 Malignant neoplasm of lower lobe, right bronchus or lung: Secondary | ICD-10-CM | POA: Diagnosis not present

## 2023-06-12 DIAGNOSIS — F01A4 Vascular dementia, mild, with anxiety: Secondary | ICD-10-CM | POA: Diagnosis not present

## 2023-06-12 DIAGNOSIS — I69354 Hemiplegia and hemiparesis following cerebral infarction affecting left non-dominant side: Secondary | ICD-10-CM | POA: Diagnosis not present

## 2023-06-12 DIAGNOSIS — Z9981 Dependence on supplemental oxygen: Secondary | ICD-10-CM | POA: Diagnosis not present

## 2023-06-14 DIAGNOSIS — I10 Essential (primary) hypertension: Secondary | ICD-10-CM | POA: Diagnosis not present

## 2023-06-14 DIAGNOSIS — G40909 Epilepsy, unspecified, not intractable, without status epilepticus: Secondary | ICD-10-CM | POA: Diagnosis not present

## 2023-06-14 DIAGNOSIS — K219 Gastro-esophageal reflux disease without esophagitis: Secondary | ICD-10-CM | POA: Diagnosis not present

## 2023-06-14 DIAGNOSIS — I679 Cerebrovascular disease, unspecified: Secondary | ICD-10-CM | POA: Diagnosis not present

## 2023-06-14 DIAGNOSIS — Z9981 Dependence on supplemental oxygen: Secondary | ICD-10-CM | POA: Diagnosis not present

## 2023-06-14 DIAGNOSIS — Z8679 Personal history of other diseases of the circulatory system: Secondary | ICD-10-CM | POA: Diagnosis not present

## 2023-06-14 DIAGNOSIS — F01A4 Vascular dementia, mild, with anxiety: Secondary | ICD-10-CM | POA: Diagnosis not present

## 2023-06-14 DIAGNOSIS — K5732 Diverticulitis of large intestine without perforation or abscess without bleeding: Secondary | ICD-10-CM | POA: Diagnosis not present

## 2023-06-14 DIAGNOSIS — Z9181 History of falling: Secondary | ICD-10-CM | POA: Diagnosis not present

## 2023-06-14 DIAGNOSIS — R202 Paresthesia of skin: Secondary | ICD-10-CM | POA: Diagnosis not present

## 2023-06-14 DIAGNOSIS — F01A3 Vascular dementia, mild, with mood disturbance: Secondary | ICD-10-CM | POA: Diagnosis not present

## 2023-06-14 DIAGNOSIS — I69354 Hemiplegia and hemiparesis following cerebral infarction affecting left non-dominant side: Secondary | ICD-10-CM | POA: Diagnosis not present

## 2023-06-14 DIAGNOSIS — Z1501 Genetic susceptibility to malignant neoplasm of breast: Secondary | ICD-10-CM | POA: Diagnosis not present

## 2023-06-14 DIAGNOSIS — Z87891 Personal history of nicotine dependence: Secondary | ICD-10-CM | POA: Diagnosis not present

## 2023-06-14 DIAGNOSIS — C3431 Malignant neoplasm of lower lobe, right bronchus or lung: Secondary | ICD-10-CM | POA: Diagnosis not present

## 2023-06-14 DIAGNOSIS — D509 Iron deficiency anemia, unspecified: Secondary | ICD-10-CM | POA: Diagnosis not present

## 2023-06-14 DIAGNOSIS — E669 Obesity, unspecified: Secondary | ICD-10-CM | POA: Diagnosis not present

## 2023-06-14 DIAGNOSIS — E785 Hyperlipidemia, unspecified: Secondary | ICD-10-CM | POA: Diagnosis not present

## 2023-06-14 DIAGNOSIS — J309 Allergic rhinitis, unspecified: Secondary | ICD-10-CM | POA: Diagnosis not present

## 2023-06-14 DIAGNOSIS — E119 Type 2 diabetes mellitus without complications: Secondary | ICD-10-CM | POA: Diagnosis not present

## 2023-06-14 DIAGNOSIS — J449 Chronic obstructive pulmonary disease, unspecified: Secondary | ICD-10-CM | POA: Diagnosis not present

## 2023-06-14 DIAGNOSIS — Z8701 Personal history of pneumonia (recurrent): Secondary | ICD-10-CM | POA: Diagnosis not present

## 2023-06-14 DIAGNOSIS — I482 Chronic atrial fibrillation, unspecified: Secondary | ICD-10-CM | POA: Diagnosis not present

## 2023-06-14 DIAGNOSIS — M47895 Other spondylosis, thoracolumbar region: Secondary | ICD-10-CM | POA: Diagnosis not present

## 2023-06-15 DIAGNOSIS — C3431 Malignant neoplasm of lower lobe, right bronchus or lung: Secondary | ICD-10-CM | POA: Diagnosis not present

## 2023-06-15 DIAGNOSIS — Z9981 Dependence on supplemental oxygen: Secondary | ICD-10-CM | POA: Diagnosis not present

## 2023-06-15 DIAGNOSIS — F01A4 Vascular dementia, mild, with anxiety: Secondary | ICD-10-CM | POA: Diagnosis not present

## 2023-06-15 DIAGNOSIS — I679 Cerebrovascular disease, unspecified: Secondary | ICD-10-CM | POA: Diagnosis not present

## 2023-06-15 DIAGNOSIS — J449 Chronic obstructive pulmonary disease, unspecified: Secondary | ICD-10-CM | POA: Diagnosis not present

## 2023-06-15 DIAGNOSIS — I69354 Hemiplegia and hemiparesis following cerebral infarction affecting left non-dominant side: Secondary | ICD-10-CM | POA: Diagnosis not present

## 2023-06-15 DIAGNOSIS — F4323 Adjustment disorder with mixed anxiety and depressed mood: Secondary | ICD-10-CM | POA: Diagnosis not present

## 2023-06-18 DIAGNOSIS — G8929 Other chronic pain: Secondary | ICD-10-CM | POA: Diagnosis not present

## 2023-06-18 DIAGNOSIS — C3431 Malignant neoplasm of lower lobe, right bronchus or lung: Secondary | ICD-10-CM | POA: Diagnosis not present

## 2023-06-18 DIAGNOSIS — G629 Polyneuropathy, unspecified: Secondary | ICD-10-CM | POA: Diagnosis not present

## 2023-06-18 DIAGNOSIS — I679 Cerebrovascular disease, unspecified: Secondary | ICD-10-CM | POA: Diagnosis not present

## 2023-06-18 DIAGNOSIS — K5900 Constipation, unspecified: Secondary | ICD-10-CM | POA: Diagnosis not present

## 2023-06-18 DIAGNOSIS — I69354 Hemiplegia and hemiparesis following cerebral infarction affecting left non-dominant side: Secondary | ICD-10-CM | POA: Diagnosis not present

## 2023-06-18 DIAGNOSIS — R109 Unspecified abdominal pain: Secondary | ICD-10-CM | POA: Diagnosis not present

## 2023-06-18 DIAGNOSIS — F01A4 Vascular dementia, mild, with anxiety: Secondary | ICD-10-CM | POA: Diagnosis not present

## 2023-06-18 DIAGNOSIS — R52 Pain, unspecified: Secondary | ICD-10-CM | POA: Diagnosis not present

## 2023-06-18 DIAGNOSIS — Z9981 Dependence on supplemental oxygen: Secondary | ICD-10-CM | POA: Diagnosis not present

## 2023-06-18 DIAGNOSIS — J449 Chronic obstructive pulmonary disease, unspecified: Secondary | ICD-10-CM | POA: Diagnosis not present

## 2023-06-19 DIAGNOSIS — I69354 Hemiplegia and hemiparesis following cerebral infarction affecting left non-dominant side: Secondary | ICD-10-CM | POA: Diagnosis not present

## 2023-06-19 DIAGNOSIS — C3431 Malignant neoplasm of lower lobe, right bronchus or lung: Secondary | ICD-10-CM | POA: Diagnosis not present

## 2023-06-19 DIAGNOSIS — F01A4 Vascular dementia, mild, with anxiety: Secondary | ICD-10-CM | POA: Diagnosis not present

## 2023-06-19 DIAGNOSIS — I679 Cerebrovascular disease, unspecified: Secondary | ICD-10-CM | POA: Diagnosis not present

## 2023-06-19 DIAGNOSIS — J449 Chronic obstructive pulmonary disease, unspecified: Secondary | ICD-10-CM | POA: Diagnosis not present

## 2023-06-19 DIAGNOSIS — Z9981 Dependence on supplemental oxygen: Secondary | ICD-10-CM | POA: Diagnosis not present

## 2023-06-19 DIAGNOSIS — F331 Major depressive disorder, recurrent, moderate: Secondary | ICD-10-CM | POA: Diagnosis not present

## 2023-06-19 DIAGNOSIS — F411 Generalized anxiety disorder: Secondary | ICD-10-CM | POA: Diagnosis not present

## 2023-06-22 DIAGNOSIS — I679 Cerebrovascular disease, unspecified: Secondary | ICD-10-CM | POA: Diagnosis not present

## 2023-06-22 DIAGNOSIS — Z9981 Dependence on supplemental oxygen: Secondary | ICD-10-CM | POA: Diagnosis not present

## 2023-06-22 DIAGNOSIS — C3431 Malignant neoplasm of lower lobe, right bronchus or lung: Secondary | ICD-10-CM | POA: Diagnosis not present

## 2023-06-22 DIAGNOSIS — J449 Chronic obstructive pulmonary disease, unspecified: Secondary | ICD-10-CM | POA: Diagnosis not present

## 2023-06-22 DIAGNOSIS — I69354 Hemiplegia and hemiparesis following cerebral infarction affecting left non-dominant side: Secondary | ICD-10-CM | POA: Diagnosis not present

## 2023-06-22 DIAGNOSIS — F01A4 Vascular dementia, mild, with anxiety: Secondary | ICD-10-CM | POA: Diagnosis not present

## 2023-06-24 DIAGNOSIS — I679 Cerebrovascular disease, unspecified: Secondary | ICD-10-CM | POA: Diagnosis not present

## 2023-06-24 DIAGNOSIS — J449 Chronic obstructive pulmonary disease, unspecified: Secondary | ICD-10-CM | POA: Diagnosis not present

## 2023-06-24 DIAGNOSIS — C3431 Malignant neoplasm of lower lobe, right bronchus or lung: Secondary | ICD-10-CM | POA: Diagnosis not present

## 2023-06-24 DIAGNOSIS — I69354 Hemiplegia and hemiparesis following cerebral infarction affecting left non-dominant side: Secondary | ICD-10-CM | POA: Diagnosis not present

## 2023-06-24 DIAGNOSIS — Z9981 Dependence on supplemental oxygen: Secondary | ICD-10-CM | POA: Diagnosis not present

## 2023-06-24 DIAGNOSIS — F01A4 Vascular dementia, mild, with anxiety: Secondary | ICD-10-CM | POA: Diagnosis not present

## 2023-06-25 DIAGNOSIS — R109 Unspecified abdominal pain: Secondary | ICD-10-CM | POA: Diagnosis not present

## 2023-06-29 DIAGNOSIS — C3431 Malignant neoplasm of lower lobe, right bronchus or lung: Secondary | ICD-10-CM | POA: Diagnosis not present

## 2023-06-29 DIAGNOSIS — I69354 Hemiplegia and hemiparesis following cerebral infarction affecting left non-dominant side: Secondary | ICD-10-CM | POA: Diagnosis not present

## 2023-06-29 DIAGNOSIS — I679 Cerebrovascular disease, unspecified: Secondary | ICD-10-CM | POA: Diagnosis not present

## 2023-06-29 DIAGNOSIS — J449 Chronic obstructive pulmonary disease, unspecified: Secondary | ICD-10-CM | POA: Diagnosis not present

## 2023-06-29 DIAGNOSIS — Z9981 Dependence on supplemental oxygen: Secondary | ICD-10-CM | POA: Diagnosis not present

## 2023-06-29 DIAGNOSIS — F01A4 Vascular dementia, mild, with anxiety: Secondary | ICD-10-CM | POA: Diagnosis not present

## 2023-06-29 DIAGNOSIS — F4323 Adjustment disorder with mixed anxiety and depressed mood: Secondary | ICD-10-CM | POA: Diagnosis not present

## 2023-06-30 DIAGNOSIS — I69354 Hemiplegia and hemiparesis following cerebral infarction affecting left non-dominant side: Secondary | ICD-10-CM | POA: Diagnosis not present

## 2023-06-30 DIAGNOSIS — I679 Cerebrovascular disease, unspecified: Secondary | ICD-10-CM | POA: Diagnosis not present

## 2023-06-30 DIAGNOSIS — J449 Chronic obstructive pulmonary disease, unspecified: Secondary | ICD-10-CM | POA: Diagnosis not present

## 2023-06-30 DIAGNOSIS — F01A4 Vascular dementia, mild, with anxiety: Secondary | ICD-10-CM | POA: Diagnosis not present

## 2023-06-30 DIAGNOSIS — Z9981 Dependence on supplemental oxygen: Secondary | ICD-10-CM | POA: Diagnosis not present

## 2023-06-30 DIAGNOSIS — C3431 Malignant neoplasm of lower lobe, right bronchus or lung: Secondary | ICD-10-CM | POA: Diagnosis not present

## 2023-07-01 DIAGNOSIS — Z9981 Dependence on supplemental oxygen: Secondary | ICD-10-CM | POA: Diagnosis not present

## 2023-07-01 DIAGNOSIS — F01A4 Vascular dementia, mild, with anxiety: Secondary | ICD-10-CM | POA: Diagnosis not present

## 2023-07-01 DIAGNOSIS — J449 Chronic obstructive pulmonary disease, unspecified: Secondary | ICD-10-CM | POA: Diagnosis not present

## 2023-07-01 DIAGNOSIS — C3431 Malignant neoplasm of lower lobe, right bronchus or lung: Secondary | ICD-10-CM | POA: Diagnosis not present

## 2023-07-01 DIAGNOSIS — I69354 Hemiplegia and hemiparesis following cerebral infarction affecting left non-dominant side: Secondary | ICD-10-CM | POA: Diagnosis not present

## 2023-07-01 DIAGNOSIS — I679 Cerebrovascular disease, unspecified: Secondary | ICD-10-CM | POA: Diagnosis not present

## 2023-07-03 DIAGNOSIS — I69354 Hemiplegia and hemiparesis following cerebral infarction affecting left non-dominant side: Secondary | ICD-10-CM | POA: Diagnosis not present

## 2023-07-03 DIAGNOSIS — F01A4 Vascular dementia, mild, with anxiety: Secondary | ICD-10-CM | POA: Diagnosis not present

## 2023-07-03 DIAGNOSIS — J449 Chronic obstructive pulmonary disease, unspecified: Secondary | ICD-10-CM | POA: Diagnosis not present

## 2023-07-03 DIAGNOSIS — I679 Cerebrovascular disease, unspecified: Secondary | ICD-10-CM | POA: Diagnosis not present

## 2023-07-03 DIAGNOSIS — C3431 Malignant neoplasm of lower lobe, right bronchus or lung: Secondary | ICD-10-CM | POA: Diagnosis not present

## 2023-07-03 DIAGNOSIS — Z9981 Dependence on supplemental oxygen: Secondary | ICD-10-CM | POA: Diagnosis not present

## 2023-07-06 DIAGNOSIS — C3431 Malignant neoplasm of lower lobe, right bronchus or lung: Secondary | ICD-10-CM | POA: Diagnosis not present

## 2023-07-06 DIAGNOSIS — J449 Chronic obstructive pulmonary disease, unspecified: Secondary | ICD-10-CM | POA: Diagnosis not present

## 2023-07-06 DIAGNOSIS — Z9981 Dependence on supplemental oxygen: Secondary | ICD-10-CM | POA: Diagnosis not present

## 2023-07-06 DIAGNOSIS — I69354 Hemiplegia and hemiparesis following cerebral infarction affecting left non-dominant side: Secondary | ICD-10-CM | POA: Diagnosis not present

## 2023-07-06 DIAGNOSIS — F01A4 Vascular dementia, mild, with anxiety: Secondary | ICD-10-CM | POA: Diagnosis not present

## 2023-07-06 DIAGNOSIS — I679 Cerebrovascular disease, unspecified: Secondary | ICD-10-CM | POA: Diagnosis not present

## 2023-07-08 DIAGNOSIS — I679 Cerebrovascular disease, unspecified: Secondary | ICD-10-CM | POA: Diagnosis not present

## 2023-07-08 DIAGNOSIS — F01A4 Vascular dementia, mild, with anxiety: Secondary | ICD-10-CM | POA: Diagnosis not present

## 2023-07-08 DIAGNOSIS — J449 Chronic obstructive pulmonary disease, unspecified: Secondary | ICD-10-CM | POA: Diagnosis not present

## 2023-07-08 DIAGNOSIS — I69354 Hemiplegia and hemiparesis following cerebral infarction affecting left non-dominant side: Secondary | ICD-10-CM | POA: Diagnosis not present

## 2023-07-08 DIAGNOSIS — Z9981 Dependence on supplemental oxygen: Secondary | ICD-10-CM | POA: Diagnosis not present

## 2023-07-08 DIAGNOSIS — C3431 Malignant neoplasm of lower lobe, right bronchus or lung: Secondary | ICD-10-CM | POA: Diagnosis not present

## 2023-07-10 DIAGNOSIS — I69354 Hemiplegia and hemiparesis following cerebral infarction affecting left non-dominant side: Secondary | ICD-10-CM | POA: Diagnosis not present

## 2023-07-10 DIAGNOSIS — F01A4 Vascular dementia, mild, with anxiety: Secondary | ICD-10-CM | POA: Diagnosis not present

## 2023-07-10 DIAGNOSIS — J449 Chronic obstructive pulmonary disease, unspecified: Secondary | ICD-10-CM | POA: Diagnosis not present

## 2023-07-10 DIAGNOSIS — I679 Cerebrovascular disease, unspecified: Secondary | ICD-10-CM | POA: Diagnosis not present

## 2023-07-10 DIAGNOSIS — C3431 Malignant neoplasm of lower lobe, right bronchus or lung: Secondary | ICD-10-CM | POA: Diagnosis not present

## 2023-07-10 DIAGNOSIS — Z9981 Dependence on supplemental oxygen: Secondary | ICD-10-CM | POA: Diagnosis not present

## 2023-07-13 DIAGNOSIS — J449 Chronic obstructive pulmonary disease, unspecified: Secondary | ICD-10-CM | POA: Diagnosis not present

## 2023-07-13 DIAGNOSIS — C3431 Malignant neoplasm of lower lobe, right bronchus or lung: Secondary | ICD-10-CM | POA: Diagnosis not present

## 2023-07-13 DIAGNOSIS — Z9981 Dependence on supplemental oxygen: Secondary | ICD-10-CM | POA: Diagnosis not present

## 2023-07-13 DIAGNOSIS — I679 Cerebrovascular disease, unspecified: Secondary | ICD-10-CM | POA: Diagnosis not present

## 2023-07-13 DIAGNOSIS — F01A4 Vascular dementia, mild, with anxiety: Secondary | ICD-10-CM | POA: Diagnosis not present

## 2023-07-13 DIAGNOSIS — F4323 Adjustment disorder with mixed anxiety and depressed mood: Secondary | ICD-10-CM | POA: Diagnosis not present

## 2023-07-13 DIAGNOSIS — I69354 Hemiplegia and hemiparesis following cerebral infarction affecting left non-dominant side: Secondary | ICD-10-CM | POA: Diagnosis not present

## 2023-07-14 DIAGNOSIS — K5732 Diverticulitis of large intestine without perforation or abscess without bleeding: Secondary | ICD-10-CM | POA: Diagnosis not present

## 2023-07-14 DIAGNOSIS — F01A4 Vascular dementia, mild, with anxiety: Secondary | ICD-10-CM | POA: Diagnosis not present

## 2023-07-14 DIAGNOSIS — R1031 Right lower quadrant pain: Secondary | ICD-10-CM | POA: Diagnosis not present

## 2023-07-14 DIAGNOSIS — E785 Hyperlipidemia, unspecified: Secondary | ICD-10-CM | POA: Diagnosis not present

## 2023-07-14 DIAGNOSIS — C3431 Malignant neoplasm of lower lobe, right bronchus or lung: Secondary | ICD-10-CM | POA: Diagnosis not present

## 2023-07-14 DIAGNOSIS — I10 Essential (primary) hypertension: Secondary | ICD-10-CM | POA: Diagnosis not present

## 2023-07-14 DIAGNOSIS — Z8701 Personal history of pneumonia (recurrent): Secondary | ICD-10-CM | POA: Diagnosis not present

## 2023-07-14 DIAGNOSIS — G629 Polyneuropathy, unspecified: Secondary | ICD-10-CM | POA: Diagnosis not present

## 2023-07-14 DIAGNOSIS — Z1501 Genetic susceptibility to malignant neoplasm of breast: Secondary | ICD-10-CM | POA: Diagnosis not present

## 2023-07-14 DIAGNOSIS — R17 Unspecified jaundice: Secondary | ICD-10-CM | POA: Diagnosis not present

## 2023-07-14 DIAGNOSIS — I482 Chronic atrial fibrillation, unspecified: Secondary | ICD-10-CM | POA: Diagnosis not present

## 2023-07-14 DIAGNOSIS — K219 Gastro-esophageal reflux disease without esophagitis: Secondary | ICD-10-CM | POA: Diagnosis not present

## 2023-07-14 DIAGNOSIS — Z87891 Personal history of nicotine dependence: Secondary | ICD-10-CM | POA: Diagnosis not present

## 2023-07-14 DIAGNOSIS — I69354 Hemiplegia and hemiparesis following cerebral infarction affecting left non-dominant side: Secondary | ICD-10-CM | POA: Diagnosis not present

## 2023-07-14 DIAGNOSIS — G40909 Epilepsy, unspecified, not intractable, without status epilepticus: Secondary | ICD-10-CM | POA: Diagnosis not present

## 2023-07-14 DIAGNOSIS — I679 Cerebrovascular disease, unspecified: Secondary | ICD-10-CM | POA: Diagnosis not present

## 2023-07-14 DIAGNOSIS — R1011 Right upper quadrant pain: Secondary | ICD-10-CM | POA: Diagnosis not present

## 2023-07-14 DIAGNOSIS — Z8679 Personal history of other diseases of the circulatory system: Secondary | ICD-10-CM | POA: Diagnosis not present

## 2023-07-14 DIAGNOSIS — M47895 Other spondylosis, thoracolumbar region: Secondary | ICD-10-CM | POA: Diagnosis not present

## 2023-07-14 DIAGNOSIS — D509 Iron deficiency anemia, unspecified: Secondary | ICD-10-CM | POA: Diagnosis not present

## 2023-07-14 DIAGNOSIS — Z9181 History of falling: Secondary | ICD-10-CM | POA: Diagnosis not present

## 2023-07-14 DIAGNOSIS — J309 Allergic rhinitis, unspecified: Secondary | ICD-10-CM | POA: Diagnosis not present

## 2023-07-14 DIAGNOSIS — Z515 Encounter for palliative care: Secondary | ICD-10-CM | POA: Diagnosis not present

## 2023-07-14 DIAGNOSIS — F01A3 Vascular dementia, mild, with mood disturbance: Secondary | ICD-10-CM | POA: Diagnosis not present

## 2023-07-14 DIAGNOSIS — Z9981 Dependence on supplemental oxygen: Secondary | ICD-10-CM | POA: Diagnosis not present

## 2023-07-14 DIAGNOSIS — G8929 Other chronic pain: Secondary | ICD-10-CM | POA: Diagnosis not present

## 2023-07-14 DIAGNOSIS — E669 Obesity, unspecified: Secondary | ICD-10-CM | POA: Diagnosis not present

## 2023-07-14 DIAGNOSIS — R202 Paresthesia of skin: Secondary | ICD-10-CM | POA: Diagnosis not present

## 2023-07-14 DIAGNOSIS — J449 Chronic obstructive pulmonary disease, unspecified: Secondary | ICD-10-CM | POA: Diagnosis not present

## 2023-07-14 DIAGNOSIS — E119 Type 2 diabetes mellitus without complications: Secondary | ICD-10-CM | POA: Diagnosis not present

## 2023-07-15 DIAGNOSIS — Z79899 Other long term (current) drug therapy: Secondary | ICD-10-CM | POA: Diagnosis not present

## 2023-07-20 DIAGNOSIS — Z9981 Dependence on supplemental oxygen: Secondary | ICD-10-CM | POA: Diagnosis not present

## 2023-07-20 DIAGNOSIS — I69354 Hemiplegia and hemiparesis following cerebral infarction affecting left non-dominant side: Secondary | ICD-10-CM | POA: Diagnosis not present

## 2023-07-20 DIAGNOSIS — I679 Cerebrovascular disease, unspecified: Secondary | ICD-10-CM | POA: Diagnosis not present

## 2023-07-20 DIAGNOSIS — J449 Chronic obstructive pulmonary disease, unspecified: Secondary | ICD-10-CM | POA: Diagnosis not present

## 2023-07-20 DIAGNOSIS — F01A4 Vascular dementia, mild, with anxiety: Secondary | ICD-10-CM | POA: Diagnosis not present

## 2023-07-20 DIAGNOSIS — C3431 Malignant neoplasm of lower lobe, right bronchus or lung: Secondary | ICD-10-CM | POA: Diagnosis not present

## 2023-07-24 DIAGNOSIS — Z9981 Dependence on supplemental oxygen: Secondary | ICD-10-CM | POA: Diagnosis not present

## 2023-07-24 DIAGNOSIS — I69354 Hemiplegia and hemiparesis following cerebral infarction affecting left non-dominant side: Secondary | ICD-10-CM | POA: Diagnosis not present

## 2023-07-24 DIAGNOSIS — I679 Cerebrovascular disease, unspecified: Secondary | ICD-10-CM | POA: Diagnosis not present

## 2023-07-24 DIAGNOSIS — J449 Chronic obstructive pulmonary disease, unspecified: Secondary | ICD-10-CM | POA: Diagnosis not present

## 2023-07-24 DIAGNOSIS — C3431 Malignant neoplasm of lower lobe, right bronchus or lung: Secondary | ICD-10-CM | POA: Diagnosis not present

## 2023-07-24 DIAGNOSIS — F01A4 Vascular dementia, mild, with anxiety: Secondary | ICD-10-CM | POA: Diagnosis not present

## 2023-07-25 DIAGNOSIS — Z9981 Dependence on supplemental oxygen: Secondary | ICD-10-CM | POA: Diagnosis not present

## 2023-07-25 DIAGNOSIS — C3431 Malignant neoplasm of lower lobe, right bronchus or lung: Secondary | ICD-10-CM | POA: Diagnosis not present

## 2023-07-25 DIAGNOSIS — J449 Chronic obstructive pulmonary disease, unspecified: Secondary | ICD-10-CM | POA: Diagnosis not present

## 2023-07-25 DIAGNOSIS — I69354 Hemiplegia and hemiparesis following cerebral infarction affecting left non-dominant side: Secondary | ICD-10-CM | POA: Diagnosis not present

## 2023-07-25 DIAGNOSIS — F01A4 Vascular dementia, mild, with anxiety: Secondary | ICD-10-CM | POA: Diagnosis not present

## 2023-07-25 DIAGNOSIS — I679 Cerebrovascular disease, unspecified: Secondary | ICD-10-CM | POA: Diagnosis not present

## 2023-09-29 ENCOUNTER — Encounter (INDEPENDENT_AMBULATORY_CARE_PROVIDER_SITE_OTHER): Payer: Self-pay | Admitting: Interventional Cardiology

## 2023-09-29 ENCOUNTER — Encounter (HOSPITAL_BASED_OUTPATIENT_CLINIC_OR_DEPARTMENT_OTHER): Payer: Self-pay | Admitting: Pulmonary Disease

## 2023-09-30 NOTE — Telephone Encounter (Signed)
 FYI
